# Patient Record
Sex: Male | Born: 1950
Health system: Southern US, Community
[De-identification: ages and names within clinical notes are randomized; demographics above are authoritative.]

## PROBLEM LIST (undated history)

## (undated) DIAGNOSIS — H409 Unspecified glaucoma: Secondary | ICD-10-CM

## (undated) DIAGNOSIS — E119 Type 2 diabetes mellitus without complications: Secondary | ICD-10-CM

## (undated) DIAGNOSIS — B1920 Unspecified viral hepatitis C without hepatic coma: Secondary | ICD-10-CM

## (undated) DIAGNOSIS — I10 Essential (primary) hypertension: Secondary | ICD-10-CM

## (undated) DIAGNOSIS — C801 Malignant (primary) neoplasm, unspecified: Secondary | ICD-10-CM

## (undated) DIAGNOSIS — Z972 Presence of dental prosthetic device (complete) (partial): Secondary | ICD-10-CM

## (undated) DIAGNOSIS — R350 Frequency of micturition: Secondary | ICD-10-CM

## (undated) DIAGNOSIS — Z923 Personal history of irradiation: Secondary | ICD-10-CM

## (undated) DIAGNOSIS — F1010 Alcohol abuse, uncomplicated: Secondary | ICD-10-CM

## (undated) HISTORY — DX: Frequency of micturition: R35.0

## (undated) HISTORY — PX: KNEE SURGERY: SHX244

## (undated) HISTORY — DX: Unspecified glaucoma: H40.9

## (undated) HISTORY — DX: Presence of dental prosthetic device (complete) (partial): Z97.2

## (undated) HISTORY — DX: Unspecified viral hepatitis C without hepatic coma: B19.20

## (undated) HISTORY — PX: EYE SURGERY: SHX253

---

## 1999-07-25 ENCOUNTER — Emergency Department (HOSPITAL_COMMUNITY): Admission: EM | Admit: 1999-07-25 | Discharge: 1999-07-25 | Payer: Self-pay | Admitting: Emergency Medicine

## 1999-07-25 ENCOUNTER — Encounter: Payer: Self-pay | Admitting: Emergency Medicine

## 2004-05-27 ENCOUNTER — Emergency Department (HOSPITAL_COMMUNITY): Admission: EM | Admit: 2004-05-27 | Discharge: 2004-05-27 | Payer: Self-pay | Admitting: Emergency Medicine

## 2005-02-07 ENCOUNTER — Inpatient Hospital Stay (HOSPITAL_COMMUNITY): Admission: EM | Admit: 2005-02-07 | Discharge: 2005-02-09 | Payer: Self-pay | Admitting: *Deleted

## 2007-10-12 ENCOUNTER — Inpatient Hospital Stay (HOSPITAL_COMMUNITY): Admission: EM | Admit: 2007-10-12 | Discharge: 2007-10-21 | Payer: Self-pay | Admitting: Family Medicine

## 2009-09-15 HISTORY — PX: ANKLE SURGERY: SHX546

## 2010-10-12 ENCOUNTER — Emergency Department (HOSPITAL_COMMUNITY)
Admission: EM | Admit: 2010-10-12 | Discharge: 2010-10-13 | Payer: Self-pay | Source: Home / Self Care | Admitting: Emergency Medicine

## 2011-01-13 ENCOUNTER — Ambulatory Visit
Admission: RE | Admit: 2011-01-13 | Discharge: 2011-01-13 | Disposition: A | Discharge status: Home / Self Care | Payer: Medicaid Other | Source: Ambulatory Visit | Attending: Family Medicine | Admitting: Family Medicine

## 2011-01-13 ENCOUNTER — Other Ambulatory Visit: Payer: Self-pay | Admitting: Family Medicine

## 2011-01-13 DIAGNOSIS — Z0000 Encounter for general adult medical examination without abnormal findings: Secondary | ICD-10-CM
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
## 2011-06-05 LAB — URINE CULTURE: Colony Count: 10000

## 2011-06-05 LAB — CBC
HCT: 40.5
Hemoglobin: 13.7
MCHC: 34.5
MCV: 96.6
MCV: 97
Platelets: 119 — ABNORMAL LOW
Platelets: 121 — ABNORMAL LOW
Platelets: 133 — ABNORMAL LOW
RBC: 3.87 — ABNORMAL LOW
RBC: 3.96 — ABNORMAL LOW
WBC: 5.1
WBC: 6.6

## 2011-06-05 LAB — COMPREHENSIVE METABOLIC PANEL
ALT: 51
AST: 42 — ABNORMAL HIGH
AST: 62 — ABNORMAL HIGH
Albumin: 3.4 — ABNORMAL LOW
Albumin: 3.4 — ABNORMAL LOW
Alkaline Phosphatase: 37 — ABNORMAL LOW
CO2: 26
CO2: 30
Calcium: 9.3
Chloride: 105
Creatinine, Ser: 0.62
Creatinine, Ser: 0.69
GFR calc Af Amer: >60
GFR calc non Af Amer: >60
GFR calc non Af Amer: >60
Potassium: 4.4
Sodium: 134 — ABNORMAL LOW
Total Bilirubin: 0.6
Total Protein: 6.9

## 2011-06-05 LAB — DIFFERENTIAL
Basophils Absolute: 0
Basophils Relative: 0
Eosinophils Absolute: 0
Eosinophils Relative: 0
Monocytes Absolute: 0.5
Monocytes Relative: 8
Neutro Abs: 5.2

## 2011-06-05 LAB — URINALYSIS, ROUTINE W REFLEX MICROSCOPIC
Bilirubin Urine: NEGATIVE
Glucose, UA: 100 — AB
Hgb urine dipstick: NEGATIVE
Ketones, ur: NEGATIVE
Ketones, ur: NEGATIVE
Nitrite: NEGATIVE
Protein, ur: NEGATIVE
Specific Gravity, Urine: 1.02
Urobilinogen, UA: 0.2
pH: 6

## 2011-06-05 LAB — RAPID URINE DRUG SCREEN, HOSP PERFORMED
Benzodiazepines: NOT DETECTED
Cocaine: NOT DETECTED
Opiates: NOT DETECTED
Tetrahydrocannabinol: NOT DETECTED

## 2011-06-05 LAB — HEMOGLOBINOPATHY EVALUATION
Hgb A: 97.4 %
Hgb F Quant: 0 (ref 0.0–2.0)
Hgb S Quant: 0 % (ref 0.0–0.0)

## 2011-06-05 LAB — URINE MICROSCOPIC-ADD ON

## 2011-06-06 LAB — CBC
HCT: 41.4
HCT: 42
Hemoglobin: 14.5
MCV: 96.5
MCV: 97.3
Platelets: 229
Platelets: 239
RBC: 4.26
RDW: 12.6
WBC: 4.3
WBC: 4.3

## 2011-06-06 LAB — PROTIME-INR
INR: 1
INR: 1.3
INR: 1.6 — ABNORMAL HIGH
Prothrombin Time: 13.3
Prothrombin Time: 19.4 — ABNORMAL HIGH

## 2011-06-28 ENCOUNTER — Emergency Department (HOSPITAL_COMMUNITY)
Admission: EM | Admit: 2011-06-28 | Discharge: 2011-06-28 | Disposition: A | Discharge status: Home / Self Care | Payer: Medicaid Other | Attending: Emergency Medicine | Admitting: Emergency Medicine

## 2011-06-28 ENCOUNTER — Inpatient Hospital Stay (INDEPENDENT_AMBULATORY_CARE_PROVIDER_SITE_OTHER)
Admission: RE | Admit: 2011-06-28 | Discharge: 2011-06-28 | Disposition: A | Discharge status: Home / Self Care | Payer: Medicaid Other | Source: Ambulatory Visit | Attending: Family Medicine | Admitting: Family Medicine

## 2011-06-28 ENCOUNTER — Emergency Department (HOSPITAL_COMMUNITY): Payer: Medicaid Other

## 2011-06-28 DIAGNOSIS — M79609 Pain in unspecified limb: Secondary | ICD-10-CM

## 2011-06-28 DIAGNOSIS — M7989 Other specified soft tissue disorders: Secondary | ICD-10-CM

## 2011-06-28 DIAGNOSIS — M25469 Effusion, unspecified knee: Secondary | ICD-10-CM | POA: Insufficient documentation

## 2011-06-28 DIAGNOSIS — X500XXA Overexertion from strenuous movement or load, initial encounter: Secondary | ICD-10-CM | POA: Insufficient documentation

## 2011-06-28 DIAGNOSIS — R609 Edema, unspecified: Secondary | ICD-10-CM | POA: Insufficient documentation

## 2011-06-28 DIAGNOSIS — L02419 Cutaneous abscess of limb, unspecified: Secondary | ICD-10-CM | POA: Insufficient documentation

## 2011-06-28 DIAGNOSIS — M25569 Pain in unspecified knee: Secondary | ICD-10-CM | POA: Insufficient documentation

## 2011-11-15 ENCOUNTER — Emergency Department (INDEPENDENT_AMBULATORY_CARE_PROVIDER_SITE_OTHER)
Admission: EM | Admit: 2011-11-15 | Discharge: 2011-11-15 | Disposition: A | Discharge status: Nursing Home (Includes ICF) | Payer: Medicaid Other | Source: Home / Self Care | Attending: Family Medicine | Admitting: Family Medicine

## 2011-11-15 ENCOUNTER — Emergency Department (HOSPITAL_COMMUNITY): Payer: Medicaid Other

## 2011-11-15 ENCOUNTER — Emergency Department (HOSPITAL_COMMUNITY)
Admission: EM | Admit: 2011-11-15 | Discharge: 2011-11-15 | Disposition: A | Discharge status: Home / Self Care | Payer: Medicaid Other | Attending: Emergency Medicine | Admitting: Emergency Medicine

## 2011-11-15 ENCOUNTER — Encounter (HOSPITAL_COMMUNITY): Payer: Self-pay | Admitting: *Deleted

## 2011-11-15 ENCOUNTER — Encounter (HOSPITAL_COMMUNITY): Payer: Self-pay | Admitting: Cardiology

## 2011-11-15 DIAGNOSIS — R6884 Jaw pain: Secondary | ICD-10-CM | POA: Insufficient documentation

## 2011-11-15 DIAGNOSIS — S02609A Fracture of mandible, unspecified, initial encounter for closed fracture: Secondary | ICD-10-CM | POA: Insufficient documentation

## 2011-11-15 DIAGNOSIS — S0993XA Unspecified injury of face, initial encounter: Secondary | ICD-10-CM | POA: Insufficient documentation

## 2011-11-15 DIAGNOSIS — S199XXA Unspecified injury of neck, initial encounter: Secondary | ICD-10-CM | POA: Insufficient documentation

## 2011-11-15 DIAGNOSIS — S0003XA Contusion of scalp, initial encounter: Secondary | ICD-10-CM

## 2011-11-15 DIAGNOSIS — S0083XA Contusion of other part of head, initial encounter: Secondary | ICD-10-CM

## 2011-11-15 MED ORDER — OXYCODONE-ACETAMINOPHEN 5-325 MG PO TABS
1.0000 | ORAL_TABLET | Freq: Once | ORAL | Status: AC
  Administered 2011-11-15: 1 via ORAL
  Filled 2011-11-15: qty 1

## 2011-11-15 MED ORDER — CLINDAMYCIN HCL 300 MG PO CAPS: 300.0000 mg | ORAL_CAPSULE | Freq: Three times a day (TID) | ORAL | Status: AC

## 2011-11-15 MED ORDER — CLINDAMYCIN HCL 300 MG PO CAPS: 300.0000 mg | ORAL_CAPSULE | Freq: Three times a day (TID) | ORAL | Status: DC

## 2011-11-15 NOTE — ED Provider Notes (Signed)
History     CSN: [id number]  Arrival date & time 11/15/11  1119   First MD Initiated Contact with Patient 11/15/11 1147      Chief Complaint  Patient presents with  . Jaw Pain    (Consider location/radiation/quality/duration/timing/severity/associated sxs/prior treatment) HPI  History reviewed. No pertinent past medical history.  Past Surgical History  Procedure Date  . Ankle surgery 2011    right ankle    No family history on file.  History  Substance Use Topics  . Smoking status: Current Everyday Smoker -- 0.5 packs/day    Types: Cigarettes  . Smokeless tobacco: Not on file  . Alcohol Use: Yes     daily      Review of Systems  Allergies  Review of patient's allergies indicates no known allergies.  Home Medications  No current outpatient prescriptions on file.  BP 139/96  Pulse 86  Temp(Src) 97.9 F (36.6 C) (Oral)  Resp 14  SpO2 95%  Physical Exam  Constitutional: He is oriented to person, place, and time. He appears well-developed and well-nourished.  HENT:  Head: Head is with contusion.    Right Ear: Hearing, tympanic membrane, external ear and ear canal normal.  Left Ear: Hearing, tympanic membrane, external ear and ear canal normal.  Nose: Right sinus exhibits maxillary sinus tenderness. Right sinus exhibits no frontal sinus tenderness. Left sinus exhibits no maxillary sinus tenderness and no frontal sinus tenderness.  Mouth/Throat: Oropharynx is clear and moist. He does not have dentures.       dentulous Tenderness over the L mandible and L TMJ  Area w/swelling as well  Neck: Normal range of motion. Neck supple.  Neurological: He is alert and oriented to person, place, and time.  Skin: Skin is warm.  Psychiatric: He has a normal mood and affect. His behavior is normal.    ED Course  Procedures (including critical care time)   1. Contusion of jaw    Transfer to [facility] for futher evaluation and possible CT of the facial bones   MDM           Hassan Rowan, MD 11/15/11 1316
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
## 2011-11-15 NOTE — Discharge Instructions (Signed)
Soft and liquid diet, absolutely no chewing for 6 weeks.  Try to quit smoking and drinking.

## 2013-05-29 ENCOUNTER — Emergency Department (INDEPENDENT_AMBULATORY_CARE_PROVIDER_SITE_OTHER): Payer: Medicaid Other

## 2013-05-29 ENCOUNTER — Emergency Department (HOSPITAL_COMMUNITY)
Admission: EM | Admit: 2013-05-29 | Discharge: 2013-05-29 | Disposition: A | Discharge status: Home / Self Care | Payer: Medicaid Other | Source: Home / Self Care | Attending: Family Medicine | Admitting: Family Medicine

## 2013-05-29 ENCOUNTER — Encounter (HOSPITAL_COMMUNITY): Payer: Self-pay | Admitting: Emergency Medicine

## 2013-05-29 DIAGNOSIS — S2249XA Multiple fractures of ribs, unspecified side, initial encounter for closed fracture: Secondary | ICD-10-CM

## 2013-05-29 DIAGNOSIS — S20219A Contusion of unspecified front wall of thorax, initial encounter: Secondary | ICD-10-CM

## 2013-05-29 DIAGNOSIS — S20212A Contusion of left front wall of thorax, initial encounter: Secondary | ICD-10-CM

## 2013-05-29 DIAGNOSIS — W1809XA Striking against other object with subsequent fall, initial encounter: Secondary | ICD-10-CM

## 2013-05-29 DIAGNOSIS — S2241XA Multiple fractures of ribs, right side, initial encounter for closed fracture: Secondary | ICD-10-CM

## 2013-05-29 MED ORDER — HYDROCODONE-ACETAMINOPHEN 5-325 MG PO TABS: 1.0000 | ORAL_TABLET | ORAL | Status: DC | PRN
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
## 2013-05-29 NOTE — ED Notes (Addendum)
C/o right side collar bone down to ribs hurt due to patient falling down stairs on Friday.  Asa taken for pain but no relief.

## 2013-05-30 NOTE — ED Provider Notes (Signed)
Medical screening examination/treatment/procedure(s) were performed by a resident physician or non-physician practitioner and as the supervising physician I was immediately available for consultation/collaboration.  Zori Benbrook, MD    Travaughn Vue S Ambriana Selway, MD 05/30/13 0757 

## 2013-06-16 ENCOUNTER — Encounter (HOSPITAL_COMMUNITY): Payer: Self-pay | Admitting: *Deleted

## 2013-06-16 ENCOUNTER — Emergency Department (HOSPITAL_COMMUNITY)
Admission: EM | Admit: 2013-06-16 | Discharge: 2013-06-16 | Disposition: A | Discharge status: Home / Self Care | Payer: Medicaid Other | Attending: Emergency Medicine | Admitting: Emergency Medicine

## 2013-06-16 ENCOUNTER — Emergency Department (HOSPITAL_COMMUNITY): Payer: Medicaid Other

## 2013-06-16 DIAGNOSIS — R0602 Shortness of breath: Secondary | ICD-10-CM | POA: Insufficient documentation

## 2013-06-16 DIAGNOSIS — R040 Epistaxis: Secondary | ICD-10-CM | POA: Insufficient documentation

## 2013-06-16 DIAGNOSIS — G8911 Acute pain due to trauma: Secondary | ICD-10-CM | POA: Insufficient documentation

## 2013-06-16 DIAGNOSIS — R059 Cough, unspecified: Secondary | ICD-10-CM | POA: Insufficient documentation

## 2013-06-16 DIAGNOSIS — S2232XS Fracture of one rib, left side, sequela: Secondary | ICD-10-CM

## 2013-06-16 DIAGNOSIS — R0789 Other chest pain: Secondary | ICD-10-CM

## 2013-06-16 DIAGNOSIS — R05 Cough: Secondary | ICD-10-CM | POA: Insufficient documentation

## 2013-06-16 DIAGNOSIS — Z8781 Personal history of (healed) traumatic fracture: Secondary | ICD-10-CM | POA: Insufficient documentation

## 2013-06-16 DIAGNOSIS — R079 Chest pain, unspecified: Secondary | ICD-10-CM

## 2013-06-16 DIAGNOSIS — R071 Chest pain on breathing: Secondary | ICD-10-CM | POA: Insufficient documentation

## 2013-06-16 DIAGNOSIS — R319 Hematuria, unspecified: Secondary | ICD-10-CM | POA: Insufficient documentation

## 2013-06-16 DIAGNOSIS — F17200 Nicotine dependence, unspecified, uncomplicated: Secondary | ICD-10-CM | POA: Insufficient documentation

## 2013-06-16 LAB — COMPREHENSIVE METABOLIC PANEL
ALT: 63 U/L — ABNORMAL HIGH (ref 0–53)
AST: 102 U/L — ABNORMAL HIGH (ref 0–37)
Albumin: 2.7 g/dL — ABNORMAL LOW (ref 3.5–5.2)
Alkaline Phosphatase: 108 U/L (ref 39–117)
BUN: 7 mg/dL (ref 6–23)
Chloride: 103 mEq/L (ref 96–112)
Potassium: 4.1 mEq/L (ref 3.5–5.1)
Sodium: 136 mEq/L (ref 135–145)
Total Bilirubin: 5 mg/dL — ABNORMAL HIGH (ref 0.3–1.2)
Total Protein: 7.1 g/dL (ref 6.0–8.3)

## 2013-06-16 LAB — CBC WITH DIFFERENTIAL/PLATELET
Basophils Absolute: 0 10*3/uL (ref 0.0–0.1)
Basophils Relative: 1 % (ref 0–1)
Eosinophils Absolute: 0.1 10*3/uL (ref 0.0–0.7)
MCH: 34 pg (ref 26.0–34.0)
MCHC: 35.8 g/dL (ref 30.0–36.0)
Neutro Abs: 1.9 10*3/uL (ref 1.7–7.7)
Neutrophils Relative %: 52 % (ref 43–77)
Platelets: 137 10*3/uL — ABNORMAL LOW (ref 150–400)

## 2013-06-16 LAB — PROTIME-INR
INR: 1.19 (ref 0.00–1.49)
Prothrombin Time: 14.8 seconds (ref 11.6–15.2)

## 2013-06-16 LAB — LACTIC ACID, PLASMA: Lactic Acid, Venous: 2.6 mmol/L — ABNORMAL HIGH (ref 0.5–2.2)

## 2013-06-16 MED ORDER — KETOROLAC TROMETHAMINE 15 MG/ML IJ SOLN: 15.0000 mg | Freq: Once | INTRAMUSCULAR | Status: DC

## 2013-06-16 MED ORDER — SODIUM CHLORIDE 0.9 % IV BOLUS (SEPSIS): 1000.0000 mL | Freq: Once | INTRAVENOUS | Status: DC

## 2013-06-16 MED ORDER — KETOROLAC TROMETHAMINE 60 MG/2ML IM SOLN
30.0000 mg | Freq: Once | INTRAMUSCULAR | Status: DC
  Filled 2013-06-16: qty 2

## 2013-06-16 MED ORDER — KETOROLAC TROMETHAMINE 30 MG/ML IJ SOLN
30.0000 mg | Freq: Once | INTRAMUSCULAR | Status: AC
  Administered 2013-06-16: 30 mg via INTRAMUSCULAR

## 2013-06-16 MED ORDER — OXYCODONE-ACETAMINOPHEN 5-325 MG PO TABS
1.0000 | ORAL_TABLET | Freq: Once | ORAL | Status: AC
  Administered 2013-06-16: 1 via ORAL
  Filled 2013-06-16: qty 1

## 2013-06-16 NOTE — ED Notes (Signed)
Received report from off going RN and introduced self to the pt. 

## 2013-06-16 NOTE — ED Notes (Signed)
Pt to xray at this time.

## 2013-06-16 NOTE — ED Notes (Signed)
Able to tolerate oral fluids and finished 24 oz of water prior to departure
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
## 2013-06-16 NOTE — ED Notes (Signed)
Pt st's pain has improved with pain med.
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
## 2013-08-18 ENCOUNTER — Emergency Department (HOSPITAL_COMMUNITY)
Admission: EM | Admit: 2013-08-18 | Discharge: 2013-08-18 | Disposition: A | Discharge status: Home / Self Care | Payer: No Typology Code available for payment source | Attending: Emergency Medicine | Admitting: Emergency Medicine

## 2013-08-18 ENCOUNTER — Encounter (HOSPITAL_COMMUNITY): Payer: Self-pay | Admitting: Emergency Medicine

## 2013-08-18 DIAGNOSIS — Y92410 Unspecified street and highway as the place of occurrence of the external cause: Secondary | ICD-10-CM | POA: Diagnosis not present

## 2013-08-18 DIAGNOSIS — S79919A Unspecified injury of unspecified hip, initial encounter: Secondary | ICD-10-CM | POA: Insufficient documentation

## 2013-08-18 DIAGNOSIS — S46909A Unspecified injury of unspecified muscle, fascia and tendon at shoulder and upper arm level, unspecified arm, initial encounter: Secondary | ICD-10-CM | POA: Insufficient documentation

## 2013-08-18 DIAGNOSIS — S8990XA Unspecified injury of unspecified lower leg, initial encounter: Secondary | ICD-10-CM | POA: Insufficient documentation

## 2013-08-18 DIAGNOSIS — IMO0002 Reserved for concepts with insufficient information to code with codable children: Secondary | ICD-10-CM | POA: Diagnosis not present

## 2013-08-18 DIAGNOSIS — M25511 Pain in right shoulder: Secondary | ICD-10-CM

## 2013-08-18 DIAGNOSIS — S0993XA Unspecified injury of face, initial encounter: Secondary | ICD-10-CM | POA: Diagnosis not present

## 2013-08-18 DIAGNOSIS — F17200 Nicotine dependence, unspecified, uncomplicated: Secondary | ICD-10-CM | POA: Insufficient documentation

## 2013-08-18 DIAGNOSIS — T148XXA Other injury of unspecified body region, initial encounter: Secondary | ICD-10-CM

## 2013-08-18 DIAGNOSIS — Y9389 Activity, other specified: Secondary | ICD-10-CM | POA: Insufficient documentation

## 2013-08-18 DIAGNOSIS — S4980XA Other specified injuries of shoulder and upper arm, unspecified arm, initial encounter: Secondary | ICD-10-CM | POA: Insufficient documentation

## 2013-08-18 NOTE — ED Notes (Signed)
Discharge instructions reviewed. Pt verbalized understanding.  
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
## 2013-08-18 NOTE — ED Provider Notes (Signed)
Medical screening examination/treatment/procedure(s) were performed by non-physician practitioner and as supervising physician I was immediately available for consultation/collaboration.  Adelyn Roscher L Jniya Madara, MD 08/18/13 1657 
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
## 2013-09-01 ENCOUNTER — Ambulatory Visit
Admission: RE | Admit: 2013-09-01 | Discharge: 2013-09-01 | Disposition: A | Discharge status: Home / Self Care | Payer: Medicaid Other | Source: Ambulatory Visit | Attending: Family Medicine | Admitting: Family Medicine

## 2013-09-01 ENCOUNTER — Other Ambulatory Visit: Payer: Self-pay | Admitting: Family Medicine

## 2013-09-01 DIAGNOSIS — M25551 Pain in right hip: Secondary | ICD-10-CM

## 2013-09-01 DIAGNOSIS — M25511 Pain in right shoulder: Secondary | ICD-10-CM

## 2013-09-01 DIAGNOSIS — M25561 Pain in right knee: Secondary | ICD-10-CM

## 2013-09-01 DIAGNOSIS — M542 Cervicalgia: Secondary | ICD-10-CM

## 2013-09-05 ENCOUNTER — Ambulatory Visit: Payer: No Typology Code available for payment source | Attending: Family Medicine | Admitting: Physical Therapy

## 2013-09-19 ENCOUNTER — Ambulatory Visit: Payer: Medicaid Other | Admitting: Physical Therapy

## 2013-09-20 ENCOUNTER — Ambulatory Visit: Payer: No Typology Code available for payment source | Attending: Family Medicine | Admitting: Physical Therapy

## 2013-10-19 ENCOUNTER — Ambulatory Visit: Payer: No Typology Code available for payment source | Attending: Family Medicine | Admitting: Physical Therapy

## 2013-10-19 DIAGNOSIS — M542 Cervicalgia: Secondary | ICD-10-CM | POA: Insufficient documentation

## 2013-10-19 DIAGNOSIS — M25519 Pain in unspecified shoulder: Secondary | ICD-10-CM | POA: Diagnosis not present

## 2013-10-19 DIAGNOSIS — M25569 Pain in unspecified knee: Secondary | ICD-10-CM | POA: Insufficient documentation

## 2013-10-19 DIAGNOSIS — M545 Low back pain, unspecified: Secondary | ICD-10-CM | POA: Diagnosis not present

## 2013-10-19 DIAGNOSIS — IMO0001 Reserved for inherently not codable concepts without codable children: Secondary | ICD-10-CM | POA: Diagnosis present

## 2013-10-21 ENCOUNTER — Ambulatory Visit: Payer: No Typology Code available for payment source | Admitting: Physical Therapy

## 2013-10-21 DIAGNOSIS — IMO0001 Reserved for inherently not codable concepts without codable children: Secondary | ICD-10-CM | POA: Diagnosis not present

## 2013-10-24 ENCOUNTER — Ambulatory Visit: Payer: No Typology Code available for payment source | Admitting: Physical Therapy

## 2013-10-24 DIAGNOSIS — IMO0001 Reserved for inherently not codable concepts without codable children: Secondary | ICD-10-CM | POA: Diagnosis not present

## 2013-10-27 ENCOUNTER — Ambulatory Visit: Payer: No Typology Code available for payment source | Admitting: Physical Therapy

## 2013-10-27 DIAGNOSIS — IMO0001 Reserved for inherently not codable concepts without codable children: Secondary | ICD-10-CM | POA: Diagnosis not present

## 2013-10-31 ENCOUNTER — Ambulatory Visit: Payer: No Typology Code available for payment source | Admitting: Physical Therapy

## 2013-10-31 DIAGNOSIS — IMO0001 Reserved for inherently not codable concepts without codable children: Secondary | ICD-10-CM | POA: Diagnosis not present

## 2013-11-04 ENCOUNTER — Ambulatory Visit: Payer: No Typology Code available for payment source | Admitting: Physical Therapy

## 2013-11-18 ENCOUNTER — Encounter (HOSPITAL_COMMUNITY): Payer: Self-pay | Admitting: Emergency Medicine

## 2013-11-18 ENCOUNTER — Emergency Department (HOSPITAL_COMMUNITY)
Admission: EM | Admit: 2013-11-18 | Discharge: 2013-11-19 | Disposition: A | Discharge status: Home / Self Care | Payer: Medicaid Other | Attending: Emergency Medicine | Admitting: Emergency Medicine

## 2013-11-18 DIAGNOSIS — F1010 Alcohol abuse, uncomplicated: Secondary | ICD-10-CM | POA: Insufficient documentation

## 2013-11-18 DIAGNOSIS — F17200 Nicotine dependence, unspecified, uncomplicated: Secondary | ICD-10-CM | POA: Insufficient documentation

## 2013-11-18 DIAGNOSIS — F10929 Alcohol use, unspecified with intoxication, unspecified: Secondary | ICD-10-CM

## 2013-11-18 HISTORY — DX: Alcohol abuse, uncomplicated: F10.10

## 2013-11-18 LAB — I-STAT CHEM 8, ED
BUN: 3 mg/dL — AB (ref 6–23)
CALCIUM ION: 1.13 mmol/L (ref 1.13–1.30)
CHLORIDE: 106 meq/L (ref 96–112)
Creatinine, Ser: 1.2 mg/dL (ref 0.50–1.35)
GLUCOSE: 118 mg/dL — AB (ref 70–99)
HCT: 48 % (ref 39.0–52.0)
Hemoglobin: 16.3 g/dL (ref 13.0–17.0)
Potassium: 4.3 mEq/L (ref 3.7–5.3)
Sodium: 149 mEq/L — ABNORMAL HIGH (ref 137–147)
TCO2: 31 mmol/L (ref 0–100)

## 2013-11-18 LAB — COMPREHENSIVE METABOLIC PANEL
ALT: 112 U/L — ABNORMAL HIGH (ref 0–53)
AST: 111 U/L — AB (ref 0–37)
Albumin: 3.8 g/dL (ref 3.5–5.2)
Alkaline Phosphatase: 56 U/L (ref 39–117)
BUN: 5 mg/dL — AB (ref 6–23)
CALCIUM: 9.1 mg/dL (ref 8.4–10.5)
CO2: 26 meq/L (ref 19–32)
Chloride: 107 mEq/L (ref 96–112)
Creatinine, Ser: 0.69 mg/dL (ref 0.50–1.35)
Glucose, Bld: 119 mg/dL — ABNORMAL HIGH (ref 70–99)
Potassium: 3.9 mEq/L (ref 3.7–5.3)
Sodium: 147 mEq/L (ref 137–147)
Total Bilirubin: 0.3 mg/dL (ref 0.3–1.2)
Total Protein: 7.9 g/dL (ref 6.0–8.3)

## 2013-11-18 LAB — CBC WITH DIFFERENTIAL/PLATELET
BASOS ABS: 0 10*3/uL (ref 0.0–0.1)
Basophils Relative: 1 % (ref 0–1)
EOS PCT: 1 % (ref 0–5)
Eosinophils Absolute: 0.1 10*3/uL (ref 0.0–0.7)
HEMATOCRIT: 44.3 % (ref 39.0–52.0)
Hemoglobin: 15.3 g/dL (ref 13.0–17.0)
LYMPHS PCT: 59 % — AB (ref 12–46)
Lymphs Abs: 3.3 10*3/uL (ref 0.7–4.0)
MCH: 33.3 pg (ref 26.0–34.0)
MCHC: 34.5 g/dL (ref 30.0–36.0)
MCV: 96.3 fL (ref 78.0–100.0)
MONO ABS: 0.3 10*3/uL (ref 0.1–1.0)
Monocytes Relative: 6 % (ref 3–12)
Neutro Abs: 1.9 10*3/uL (ref 1.7–7.7)
Neutrophils Relative %: 33 % — ABNORMAL LOW (ref 43–77)
Platelets: 185 10*3/uL (ref 150–400)
RBC: 4.6 MIL/uL (ref 4.22–5.81)
RDW: 13.4 % (ref 11.5–15.5)
WBC: 5.6 10*3/uL (ref 4.0–10.5)

## 2013-11-18 LAB — ETHANOL: Alcohol, Ethyl (B): 414 mg/dL (ref 0–11)

## 2013-11-18 LAB — CBG MONITORING, ED: Glucose-Capillary: 107 mg/dL — ABNORMAL HIGH (ref 70–99)

## 2013-11-18 MED ORDER — SODIUM CHLORIDE 0.9 % IV SOLN: INTRAVENOUS | Status: DC

## 2013-11-18 MED ORDER — SODIUM CHLORIDE 0.9 % IV BOLUS (SEPSIS)
1000.0000 mL | Freq: Once | INTRAVENOUS | Status: AC
  Administered 2013-11-18: 1000 mL via INTRAVENOUS
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
## 2013-11-18 NOTE — ED Provider Notes (Signed)
CSN: [id number]     Arrival date & time 11/18/13  1755 History   First MD Initiated Contact with Patient 11/18/13 [place]     Chief Complaint  Patient presents with  . Alcohol Intoxication     (Consider location/radiation/quality/duration/timing/severity/associated sxs/prior Treatment) Patient is a 63 y.o. male presenting with intoxication. The history is provided by the patient. The history is limited by the condition of the patient.  Alcohol Intoxication   patient here after being found by neighbor unresponsive and is regular. It is noted to have heavy alcohol on board. Patient here admits to drinking copious of alcohol today. Denies any somatic complaints at this time is not cooperative with the history.  Past Medical History  Diagnosis Date  . ETOH abuse    Past Surgical History  Procedure Laterality Date  . Ankle surgery  2011    right ankle   History reviewed. No pertinent family history. History  Substance Use Topics  . Smoking status: Current Every Day Smoker -- 0.50 packs/day    Types: Cigarettes  . Smokeless tobacco: Not on file  . Alcohol Use: Yes     Comment: daily    Review of Systems  Unable to perform ROS     Allergies  Review of patient's allergies indicates no known allergies.  Home Medications   Current Outpatient Rx  Name  Route  Sig  Dispense  Refill  . ibuprofen (ADVIL,MOTRIN) 200 MG tablet   Oral   Take 400 mg by mouth every 6 (six) hours as needed. For arthritis         . Multiple Vitamins-Minerals (MULTIVITAMIN PO)   Oral   Take 1 tablet by mouth daily.          BP 120/82  Pulse 98  Temp(Src) 97.6 F (36.4 C) (Oral)  Resp 16  SpO2 93% Physical Exam  Nursing note and vitals reviewed. Constitutional: He appears lethargic. He appears cachectic. He is easily aroused.  Non-toxic appearance. He does not have a sickly appearance. He does not appear ill. No distress.  HENT:  Head: Normocephalic and atraumatic.  Eyes: Conjunctivae, EOM  and lids are normal. Pupils are equal, round, and reactive to light.  Neck: Normal range of motion. Neck supple. No tracheal deviation present. No mass present.  Cardiovascular: Normal rate, regular rhythm and normal heart sounds.  Exam reveals no gallop.   No murmur heard. Pulmonary/Chest: Effort normal and breath sounds normal. No stridor. No respiratory distress. He has no decreased breath sounds. He has no wheezes. He has no rhonchi. He has no rales.  Abdominal: Soft. Normal appearance and bowel sounds are normal. He exhibits no distension. There is no tenderness. There is no rebound and no CVA tenderness.  Musculoskeletal: Normal range of motion. He exhibits no edema and no tenderness.  Neurological: He is easily aroused. He appears lethargic. GCS eye subscore is 4. GCS verbal subscore is 4. GCS motor subscore is 6.  Skin: Skin is warm and dry. No abrasion and no rash noted.  Psychiatric: His affect is blunt. His speech is delayed. He is slowed.    ED Course  Procedures (including critical care time) Labs Review Labs Reviewed - No data to display Imaging Review No results found.   EKG Interpretation None      MDM   Final diagnoses:  None    Patient rechecked and is more sober at this time. Patient had mild hyponatremia which was treated with IV fluids and repeat electrolytes showed improvement.  Will continue to monitor he is clinically sober. Signed out to oncoming physician.    Leota Jacobsen, MD 11/20/13 [phone]

## 2013-11-19 NOTE — ED Provider Notes (Signed)
Patient signed out to me at change of shift (2359h) by Dr. Zenia Resides. Patient presented with AMS secondary to alcohol intoxication - BAL of > 400 mg/dL.  Patient is now alert and ambulatory without assistance. He is stable for discharge.   Elyn Peers, MD 11/19/13 [phone]

## 2013-11-19 NOTE — Discharge Instructions (Signed)
Alcohol Intoxication Alcohol intoxication occurs when the amount of alcohol that a person has consumed impairs his or her ability to mentally and physically function. Alcohol directly impairs the normal chemical activity of the brain. Drinking large amounts of alcohol can lead to changes in mental function and behavior, and it can cause many physical effects that can be harmful.  Alcohol intoxication can range in severity from mild to very severe. Various factors can affect the level of intoxication that occurs, such as the person's age, gender, weight, frequency of alcohol consumption, and the presence of other medical conditions (such as diabetes, seizures, or heart conditions). Dangerous levels of alcohol intoxication may occur when people drink large amounts of alcohol in a short period (binge drinking). Alcohol can also be especially dangerous when combined with certain prescription medicines or "recreational" drugs. SIGNS AND SYMPTOMS Some common signs and symptoms of mild alcohol intoxication include:  Loss of coordination.  Changes in mood and behavior.  Impaired judgment.  Slurred speech. As alcohol intoxication progresses to more severe levels, other signs and symptoms will appear. These may include:  Vomiting.  Confusion and impaired memory.  Slowed breathing.  Seizures.  Loss of consciousness. DIAGNOSIS  Your health care provider will take a medical history and perform a physical exam. You will be asked about the amount and type of alcohol you have consumed. Blood tests will be done to measure the concentration of alcohol in your blood. In many places, your blood alcohol level must be lower than 80 mg/dL (0.08%) to legally drive. However, many dangerous effects of alcohol can occur at much lower levels.  TREATMENT  People with alcohol intoxication often do not require treatment. Most of the effects of alcohol intoxication are temporary, and they go away as the alcohol naturally  leaves the body. Your health care provider will monitor your condition until you are stable enough to go home. Fluids are sometimes given through an IV access tube to help prevent dehydration.  HOME CARE INSTRUCTIONS  Do not drive after drinking alcohol.  Stay hydrated. Drink enough water and fluids to keep your urine clear or pale yellow. Avoid caffeine.   Only take over-the-counter or prescription medicines as directed by your health care provider.  SEEK MEDICAL CARE IF:   You have persistent vomiting.   You do not feel better after a few days.  You have frequent alcohol intoxication. Your health care provider can help determine if you should see a substance use treatment counselor. SEEK IMMEDIATE MEDICAL CARE IF:   You become shaky or tremble when you try to stop drinking.   You shake uncontrollably (seizure).   You throw up (vomit) blood. This may be bright red or may look like black coffee grounds.   You have blood in your stool. This may be bright red or may appear as a black, tarry, bad smelling stool.   You become lightheaded or faint.  MAKE SURE YOU:   Understand these instructions.  Will watch your condition.  Will get help right away if you are not doing well or get worse. Document Released: 06/11/2005 Document Revised: 05/04/2013 Document Reviewed: 02/04/2013 [facility] Patient Information 2014 [place].  Finding Treatment for Alcohol and Drug Addiction It can be hard to find the right place to get professional treatment. Here are some important things to consider:  There are different types of treatment to choose from.  Some programs are live-in (residential) while others are not (outpatient). Sometimes a combination is offered.  No  single type of program is right for everyone.  Most treatment programs involve a combination of education, counseling, and a 12-step, spiritually-based approach.  There are non-spiritually based programs (not  12-step).  Some treatment programs are government sponsored. They are geared for patients without private insurance.  Treatment programs can vary in many respects such as:  Cost and types of insurance accepted.  Types of on-site medical services offered.  Length of stay, setting, and size.  Overall philosophy of treatment. A person may need specialized treatment or have needs not addressed by all programs. For example, adolescents need treatment appropriate for their age. Other people have secondary disorders that must be managed as well. Secondary conditions can include mental illness, such as depression or diabetes. Often, a period of detoxification from alcohol or drugs is needed. This requires medical supervision and not all programs offer this. THINGS TO CONSIDER WHEN SELECTING A TREATMENT PROGRAM   Is the program certified by the appropriate government agency? Even private programs must be certified and employ certified professionals.  Does the program accept your insurance? If not, can a payment plan be set up?  Is the facility clean, organized, and well run? Do they allow you to speak with graduates who can share their treatment experience with you? Can you tour the facility? Can you meet with staff?  Does the program meet the full range of individual needs?  Does the treatment program address sexual orientation and physical disabilities? Do they provide age, gender, and culturally appropriate treatment services?  Is treatment available in languages other than English?  Is long-term aftercare support or guidance encouraged and provided?  Is assessment of an individual's treatment plan ongoing to ensure it meets changing needs?  Does the program use strategies to encourage reluctant patients to remain in treatment long enough to increase the likelihood of success?  Does the program offer counseling (individual or group) and other behavioral therapies?  Does the program  offer medicine as part of the treatment regimen, if needed?  Is there ongoing monitoring of possible relapse? Is there a defined relapse prevention program? Are services or referrals offered to family members to ensure they understand addiction and the recovery process? This would help them support the recovering individual.  Are 12-step meetings held at the center or is transport available for patients to attend outside meetings? In countries outside of the [place] and [place], [employer] for contact information for services in your area. Document Released: 07/31/2005 Document Revised: 11/24/2011 Document Reviewed: 02/10/2008 [facility] Patient Information 2014 [place].

## 2014-06-26 ENCOUNTER — Emergency Department (HOSPITAL_COMMUNITY)
Admission: EM | Admit: 2014-06-26 | Discharge: 2014-06-26 | Disposition: A | Discharge status: Home / Self Care | Payer: Medicaid Other | Attending: Emergency Medicine | Admitting: Emergency Medicine

## 2014-06-26 ENCOUNTER — Encounter (HOSPITAL_COMMUNITY): Payer: Self-pay | Admitting: Emergency Medicine

## 2014-06-26 ENCOUNTER — Emergency Department (HOSPITAL_COMMUNITY): Payer: Medicaid Other

## 2014-06-26 DIAGNOSIS — Y9389 Activity, other specified: Secondary | ICD-10-CM | POA: Insufficient documentation

## 2014-06-26 DIAGNOSIS — W01198A Fall on same level from slipping, tripping and stumbling with subsequent striking against other object, initial encounter: Secondary | ICD-10-CM | POA: Diagnosis not present

## 2014-06-26 DIAGNOSIS — E119 Type 2 diabetes mellitus without complications: Secondary | ICD-10-CM | POA: Diagnosis not present

## 2014-06-26 DIAGNOSIS — Z720 Tobacco use: Secondary | ICD-10-CM | POA: Insufficient documentation

## 2014-06-26 DIAGNOSIS — Z9889 Other specified postprocedural states: Secondary | ICD-10-CM | POA: Diagnosis not present

## 2014-06-26 DIAGNOSIS — Y9289 Other specified places as the place of occurrence of the external cause: Secondary | ICD-10-CM | POA: Diagnosis not present

## 2014-06-26 DIAGNOSIS — S82044A Nondisplaced comminuted fracture of right patella, initial encounter for closed fracture: Secondary | ICD-10-CM | POA: Insufficient documentation

## 2014-06-26 DIAGNOSIS — S8991XA Unspecified injury of right lower leg, initial encounter: Secondary | ICD-10-CM | POA: Diagnosis present

## 2014-06-26 HISTORY — DX: Type 2 diabetes mellitus without complications: E11.9

## 2014-06-26 MED ORDER — IBUPROFEN 200 MG PO TABS
600.0000 mg | ORAL_TABLET | Freq: Once | ORAL | Status: AC
  Administered 2014-06-26: 600 mg via ORAL
  Filled 2014-06-26: qty 3

## 2014-06-26 MED ORDER — HYDROCODONE-ACETAMINOPHEN 5-325 MG PO TABS: 2.0000 | ORAL_TABLET | ORAL | Status: DC | PRN

## 2014-06-26 NOTE — Discharge Instructions (Signed)
Knee Bracing  Knee braces are supports to help stabilize and protect an injured or painful knee. They come in many different styles. They should support and protect the knee without increasing the chance of other injuries to yourself or others. It is important not to have a false sense of security when using a brace. Knee braces that help you to keep using your knee:  · Do not restore normal knee stability under high stress forces.  · May decrease some aspects of athletic performance.  Some of the different types of knee braces are:  · Prophylactic knee braces are designed to prevent or reduce the severity of knee injuries during sports that make injury to the knee more likely.  · Rehabilitative knee braces are designed to allow protected motion of:  ¨ Injured knees.  ¨ Knees that have been treated with or without surgery.  There is no evidence that the use of a supportive knee brace protects the graft following a successful anterior cruciate ligament (ACL) reconstruction. However, braces are sometimes used to:   · Protect injured ligaments.  · Control knee movement during the initial healing period.  They may be used as part of the treatment program for the various injured ligaments or cartilage of the knee including the:  · Anterior cruciate ligament.  · Medial collateral ligament.  · Medial or lateral cartilage (meniscus).  · Posterior cruciate ligament.  · Lateral collateral ligament.  Rehabilitative knee braces are most commonly used:  · During crutch-assisted walking right after injury.  · During crutch-assisted walking right after surgery to repair the cartilage and/or cruciate ligament injury.  · For a short period of time, 2-8 weeks, after the injury or surgery.  The value of a rehabilitative brace as opposed to a cast or splint includes the:  · Ability to adjust the brace for swelling.  · Ability to remove the brace for examinations, icing, or showering.  · Ability to allow for movement in a controlled  range of motion.  Functional knee braces give support to knees that have already been injured. They are designed to provide stability for the injured knee and provide protection after repair. Functional knee braces may not affect performance much. Lower extremity muscle strengthening, flexibility, and improvement in technique are more important than bracing in treating ligamentous knee injuries. Functional braces are not a substitute for rehabilitation or surgical procedures.  Unloader/off-loader braces are designed to provide pain relief in arthritic knees. Patients with wear and tear arthritis from growing old or from an old cartilage injury (osteoarthritis) of the knee, and bowlegged (varus) or knock-knee (valgus) deformities, often develop increased pain in the arthritic side due to increased loading. Unloader/off-loader braces are made to reduce uneven loading in such knees. There is reduction in bowing out movement in bowlegged knees when the correct unloader brace is used. Patients with advanced osteoarthritis or severe varus or valgus alignment problems would not likely benefit from bracing.  Patellofemoral braces help the kneecap to move smoothly and well centered over the end of the femur in the knee.   Most people who wear knee braces feel that they help. However, there is a lack of scientific evidence that knee braces are helpful at the level needed for athletic participation to prevent injury. In spite of this, athletes report an increase in knee stability, pain relief, performance improvement, and confidence during athletics when using a brace.   Different knee problems require different knee braces:  · Your caregiver may suggest one   also need one for pain in the front of your knee that is not getting better with strengthening and  flexibility exercises. Get your caregiver's advice if you want to try a knee brace. The caregiver will advise you on where to get them and provide a prescription when it is needed to fashion and/or fit the brace. Knee braces are the least important part of preventing knee injuries or getting better following injury. Stretching, strengthening and technique improvement are far more important in caring for and preventing knee injuries. When strengthening your knee, increase your activities a little at a time so as not to develop injuries from overuse. Work out an exercise plan with your caregiver and/or physical therapist to get the best program for you. Do not let a knee brace become a crutch. Always remember, there are no braces which support the knee as well as your original ligaments and cartilage you were born with. Conditioning, proper warm-up, and stretching remain the most important parts of keeping your knees healthy. HOW TO USE A KNEE BRACE  During sports, knee braces should be used as directed by your caregiver.  Make sure that the hinges are where the knee bends.  Straps, tapes, or hook-and-loop tapes should be fastened around your leg as instructed.  You should check the placement of the brace during activities to make sure that it has not moved. Poorly positioned braces can hurt rather than help you.  To work well, a knee brace should be worn during all activities that put you at risk of knee injury.  Warm up properly before beginning athletic activities. HOME CARE INSTRUCTIONS  Knee braces often get damaged during normal use. Replace worn-out braces for maximum benefit.  Clean regularly with soap and water.  Inspect your brace often for wear and tear.  Cover exposed metal to protect others from injury.  Durable materials may cost more, but last longer. SEEK IMMEDIATE MEDICAL CARE IF:   Your knee seems to be getting worse rather than better.  You have increasing pain or  swelling in the knee.  You have problems caused by the knee brace.  You have increased swelling or inflammation (redness or soreness) in your knee.  Your knee becomes warm and more painful and you develop an unexplained temperature over 101F (38.3C). MAKE SURE YOU:   Understand these instructions.  Will watch your condition.  Will get help right away if you are not doing well or get worse. See your caregiver, physical therapist, or orthopedic surgeon for additional information. Document Released: 11/22/2003 Document Revised: 01/16/2014 Document Reviewed: 02/28/2009 [facility] Patient Information 2015 [place], [place]. This information is not intended to replace advice given to you by your health care provider. Make sure you discuss any questions you have with your health care provider.  Discussed with Dr. Tamera Punt, who will see, you in the office on Wednesday.  Please call first thing tomorrow morning to set a specific time

## 2014-06-26 NOTE — ED Provider Notes (Signed)
CSN: [id number]     Arrival date & time 06/26/14  1644 History  This chart was scribed for Wendall Papa, PA, working with Babette Relic, MD found by Starleen Arms, ED Scribe. This patient was seen in room WTR6/WTR6 and the patient's care was started at 8:08 PM.   Chief Complaint  Patient presents with  . Knee Pain    right   The history is provided by the patient. No language interpreter was used.    HPI Comments: [name] is a 63 y.o. male who presents to the Emergency Department complaining of right knee pain and swelling secondary to an injury 3 days ago.  Patient reports he tripped over a curb and landed on his knee and has since had constant, improving knee pain   Patient reports he has taken Tylenol without relief. Patient reports he is able to ambulate with pain.  Patient has a history of right knee surgery by Dr. Eulas Post > 25 years ago.   Past Medical History  Diagnosis Date  . ETOH abuse   . Diabetes mellitus without complication    Past Surgical History  Procedure Laterality Date  . Ankle surgery  2011    right ankle  . Knee surgery     History reviewed. No pertinent family history. History  Substance Use Topics  . Smoking status: Current Every Day Smoker -- 0.50 packs/day    Types: Cigarettes  . Smokeless tobacco: Not on file  . Alcohol Use: Yes     Comment: daily    Review of Systems  Constitutional: Negative for fever.  Musculoskeletal: Positive for arthralgias, gait problem and joint swelling.  Skin: Negative for wound.  All other systems reviewed and are negative.    Allergies  Review of patient's allergies indicates no known allergies.  Home Medications   Prior to Admission medications   Medication Sig Start Date End Date Taking? Authorizing Provider  HYDROcodone-acetaminophen (NORCO/VICODIN) 5-325 MG per tablet Take 2 tablets by mouth every 4 (four) hours as needed. 06/26/14   Garald Balding, NP   BP 121/91  Pulse 84  Temp(Src) 98.1 F (36.7  C) (Oral)  Resp 17  SpO2 98% Physical Exam  Nursing note and vitals reviewed. Constitutional: He is oriented to person, place, and time. He appears well-developed and well-nourished. No distress.  HENT:  Head: Normocephalic and atraumatic.  Eyes: Conjunctivae and EOM are normal.  Neck: Normal range of motion. Neck supple.  Cardiovascular: Normal rate.   Pulmonary/Chest: Effort normal. No respiratory distress.  Musculoskeletal: He exhibits edema and tenderness.       Right knee: He exhibits decreased range of motion, swelling and effusion. He exhibits no ecchymosis, no deformity and no erythema. Tenderness found.  Neurological: He is alert and oriented to person, place, and time.  Skin: Skin is warm and dry.  Psychiatric: He has a normal mood and affect. His behavior is normal.    ED Course  Procedures (including critical care time)  DIAGNOSTIC STUDIES: Oxygen Saturation is 97% on RA, normal by my interpretation.    COORDINATION OF CARE:  10:43 PM Will order imaging.  Patient acknowledges and agrees with plan.    Labs Review Labs Reviewed - No data to display  Imaging Review Dg Knee Complete 4 Views Right  06/26/2014   CLINICAL DATA:  Status post fall 2 days ago. Right knee pain and swelling.  EXAM: RIGHT KNEE - COMPLETE 4+ VIEW  COMPARISON:  Two views right knee 09/01/2013.  FINDINGS: The patient has a comminuted but nondisplaced fracture of the patella. The fracture has an oblique component extending from the superior aspect of the patella on the lateral side and inferior in an oblique orientation toward the medial facet. Second component is oblique in orientation toward the lateral side. Associated large joint effusion is identified. No other acute bony or joint abnormality is seen with degenerative disease about the knee noted.  IMPRESSION: Comminuted but nondisplaced fracture of the patella with associated large joint effusion.  Tricompartmental osteoarthritis.    Electronically Signed   By: Inge Rise M.D.   On: 06/26/2014 21:13     EKG Interpretation None     Up with Dr. Tamera Punt, who agrees the plan of a mobilization pain control, and crutches.  He, states he will see the patient in the office on Wednesday MDM   Final diagnoses:  Nondisplaced comminuted fracture of patella, right, closed, initial encounter        I personally performed the services described in this documentation, which was scribed in my presence. The recorded information has been reviewed and is accurate.   Garald Balding, NP 06/26/14 2243

## 2014-06-26 NOTE — ED Notes (Addendum)
Ortho at bedside. Attempted calling patient's friend Pilar Plate with no answer. Bus pass given.

## 2014-06-26 NOTE — ED Notes (Signed)
Patient c/o right knee pain. Took three tylenol around 1500 with no alleviation of symptoms. Had surgery on right knee "many years ago." Says this pain is "on and off." Reports falling on his right knee last Saturday. Full ROM with right knee and foot. No other complaints/concerns.

## 2014-07-01 NOTE — ED Provider Notes (Signed)
Medical screening examination/treatment/procedure(s) were performed by non-physician practitioner and as supervising physician I was immediately available for consultation/collaboration.   EKG Interpretation None       Babette Relic, MD 07/01/14 2203

## 2014-12-07 ENCOUNTER — Emergency Department (HOSPITAL_COMMUNITY): Payer: Medicaid Other

## 2014-12-07 ENCOUNTER — Encounter (HOSPITAL_COMMUNITY): Payer: Self-pay | Admitting: Emergency Medicine

## 2014-12-07 ENCOUNTER — Emergency Department (HOSPITAL_COMMUNITY)
Admission: EM | Admit: 2014-12-07 | Discharge: 2014-12-08 | Disposition: A | Discharge status: Home / Self Care | Payer: Medicaid Other | Attending: Emergency Medicine | Admitting: Emergency Medicine

## 2014-12-07 DIAGNOSIS — Y939 Activity, unspecified: Secondary | ICD-10-CM | POA: Insufficient documentation

## 2014-12-07 DIAGNOSIS — Y998 Other external cause status: Secondary | ICD-10-CM | POA: Diagnosis not present

## 2014-12-07 DIAGNOSIS — E119 Type 2 diabetes mellitus without complications: Secondary | ICD-10-CM | POA: Insufficient documentation

## 2014-12-07 DIAGNOSIS — F1310 Sedative, hypnotic or anxiolytic abuse, uncomplicated: Secondary | ICD-10-CM | POA: Diagnosis not present

## 2014-12-07 DIAGNOSIS — Z720 Tobacco use: Secondary | ICD-10-CM | POA: Insufficient documentation

## 2014-12-07 DIAGNOSIS — X58XXXA Exposure to other specified factors, initial encounter: Secondary | ICD-10-CM | POA: Insufficient documentation

## 2014-12-07 DIAGNOSIS — S4991XA Unspecified injury of right shoulder and upper arm, initial encounter: Secondary | ICD-10-CM | POA: Diagnosis not present

## 2014-12-07 DIAGNOSIS — F1010 Alcohol abuse, uncomplicated: Secondary | ICD-10-CM | POA: Insufficient documentation

## 2014-12-07 DIAGNOSIS — R4182 Altered mental status, unspecified: Secondary | ICD-10-CM | POA: Diagnosis present

## 2014-12-07 DIAGNOSIS — Y9289 Other specified places as the place of occurrence of the external cause: Secondary | ICD-10-CM | POA: Diagnosis not present

## 2014-12-07 DIAGNOSIS — T1490XA Injury, unspecified, initial encounter: Secondary | ICD-10-CM

## 2014-12-07 DIAGNOSIS — F1410 Cocaine abuse, uncomplicated: Secondary | ICD-10-CM | POA: Diagnosis not present

## 2014-12-07 LAB — CBC WITH DIFFERENTIAL/PLATELET
Basophils Absolute: 0 10*3/uL (ref 0.0–0.1)
Basophils Relative: 1 % (ref 0–1)
Eosinophils Absolute: 0.1 10*3/uL (ref 0.0–0.7)
Eosinophils Relative: 2 % (ref 0–5)
HEMATOCRIT: 44.2 % (ref 39.0–52.0)
HEMOGLOBIN: 14.8 g/dL (ref 13.0–17.0)
LYMPHS ABS: 2.9 10*3/uL (ref 0.7–4.0)
Lymphocytes Relative: 56 % — ABNORMAL HIGH (ref 12–46)
MCH: 32.7 pg (ref 26.0–34.0)
MCHC: 33.5 g/dL (ref 30.0–36.0)
MCV: 97.8 fL (ref 78.0–100.0)
MONOS PCT: 7 % (ref 3–12)
Monocytes Absolute: 0.4 10*3/uL (ref 0.1–1.0)
NEUTROS ABS: 1.8 10*3/uL (ref 1.7–7.7)
NEUTROS PCT: 34 % — AB (ref 43–77)
Platelets: 168 10*3/uL (ref 150–400)
RBC: 4.52 MIL/uL (ref 4.22–5.81)
RDW: 13.4 % (ref 11.5–15.5)
WBC: 5.2 10*3/uL (ref 4.0–10.5)

## 2014-12-07 LAB — COMPREHENSIVE METABOLIC PANEL
ALBUMIN: 3.9 g/dL (ref 3.5–5.2)
ALT: 63 U/L — AB (ref 0–53)
AST: 51 U/L — ABNORMAL HIGH (ref 0–37)
Alkaline Phosphatase: 44 U/L (ref 39–117)
Anion gap: 4 — ABNORMAL LOW (ref 5–15)
BUN: 7 mg/dL (ref 6–23)
CHLORIDE: 109 mmol/L (ref 96–112)
CO2: 31 mmol/L (ref 19–32)
Calcium: 9.1 mg/dL (ref 8.4–10.5)
Creatinine, Ser: 0.84 mg/dL (ref 0.50–1.35)
GFR calc Af Amer: >90 mL/min (ref >90)
GFR calc non Af Amer: >90 mL/min (ref >90)
Glucose, Bld: 114 mg/dL — ABNORMAL HIGH (ref 70–99)
Potassium: 4.3 mmol/L (ref 3.5–5.1)
SODIUM: 144 mmol/L (ref 135–145)
Total Bilirubin: 0.6 mg/dL (ref 0.3–1.2)
Total Protein: 7.8 g/dL (ref 6.0–8.3)

## 2014-12-07 LAB — RAPID URINE DRUG SCREEN, HOSP PERFORMED
Amphetamines: NOT DETECTED
BARBITURATES: NOT DETECTED
Benzodiazepines: POSITIVE — AB
Cocaine: POSITIVE — AB
Opiates: NOT DETECTED
Tetrahydrocannabinol: NOT DETECTED

## 2014-12-07 LAB — CBG MONITORING, ED: Glucose-Capillary: 103 mg/dL — ABNORMAL HIGH (ref 70–99)

## 2014-12-07 LAB — ETHANOL: Alcohol, Ethyl (B): 267 mg/dL — ABNORMAL HIGH (ref 0–9)

## 2014-12-07 NOTE — ED Notes (Signed)
Pt arrives via EMS, found down unresponsive in yard by local child. Semi responsive on EMS arrival. Shallow respirations, hypotensive. 1.2 narcan given, pt became more alert, respirations more effective. Unaware of what happened prior to EMS arrival. Ccollar in place, unsure if pt fell. R shoulder pain. States he was "playing with the easter bunny." Bottle of vodka in pocket, admits to pills from friend and a half a pint of ETOH.

## 2014-12-07 NOTE — ED Provider Notes (Signed)
CSN: [id number]     Arrival date & time 12/07/14  2030 History   First MD Initiated Contact with Patient 12/07/14 2042     Chief Complaint  Patient presents with  . Altered Mental Status     (Consider location/radiation/quality/duration/timing/severity/associated sxs/prior Treatment) HPI Comments: Patient here after being found unresponsive in a person's backyard. Admits to EtOH tonight. Also possible opiate use as well. Patient is unsure of how he can on the ground. Exam is limited due to his current intoxicated state. EMS was called and patient transported here with c-collar on. Further history obtainable due to his current state  Patient is a 64 y.o. male presenting with altered mental status. The history is provided by the patient. The history is limited by the condition of the patient.  Altered Mental Status   Past Medical History  Diagnosis Date  . ETOH abuse   . Diabetes mellitus without complication    Past Surgical History  Procedure Laterality Date  . Ankle surgery  2011    right ankle  . Knee surgery     No family history on file. History  Substance Use Topics  . Smoking status: Current Every Day Smoker -- 0.50 packs/day    Types: Cigarettes  . Smokeless tobacco: Not on file  . Alcohol Use: Yes     Comment: daily    Review of Systems  Unable to perform ROS     Allergies  Review of patient's allergies indicates no known allergies.  Home Medications   Prior to Admission medications   Medication Sig Start Date End Date Taking? Authorizing Provider  HYDROcodone-acetaminophen (NORCO/VICODIN) 5-325 MG per tablet Take 2 tablets by mouth every 4 (four) hours as needed. 06/26/14   Junius Creamer, NP   BP 119/92 mmHg  Pulse 84  Temp(Src) 97.8 F (36.6 C) (Oral)  Resp 16  Ht 5\' 6"  (1.676 m)  Wt 137 lb (62.143 kg)  BMI 22.12 kg/m2  SpO2 98% Physical Exam  Constitutional: He is oriented to person, place, and time. He appears well-developed and well-nourished.  He appears lethargic.  Non-toxic appearance. No distress.  HENT:  Head: Normocephalic and atraumatic.  Eyes: Conjunctivae, EOM and lids are normal. Pupils are equal, round, and reactive to light.  Neck: Normal range of motion. Neck supple. No spinous process tenderness and no muscular tenderness present. No tracheal deviation present. No thyroid mass present.  Cardiovascular: Normal rate, regular rhythm and normal heart sounds.  Exam reveals no gallop.   No murmur heard. Pulmonary/Chest: Effort normal and breath sounds normal. No stridor. No respiratory distress. He has no decreased breath sounds. He has no wheezes. He has no rhonchi. He has no rales.  Abdominal: Soft. Normal appearance and bowel sounds are normal. He exhibits no distension. There is no tenderness. There is no rebound and no CVA tenderness.  Musculoskeletal: Normal range of motion. He exhibits no edema or tenderness.  Right shoulder with full range of motion and no deformity noted. Right clavicle nontender. Skin intact no bruising at shoulder  Neurological: He is oriented to person, place, and time. He has normal strength. He appears lethargic. No cranial nerve deficit or sensory deficit. GCS eye subscore is 4. GCS verbal subscore is 5. GCS motor subscore is 6.  Skin: Skin is warm and dry. No abrasion and no rash noted.  Psychiatric: His affect is blunt. His speech is delayed. He is slowed.  Nursing note and vitals reviewed.   ED Course  Procedures (including critical care  time) Labs Review Labs Reviewed  CBG MONITORING, ED - Abnormal; Notable for the following:    Glucose-Capillary 103 (*)    All other components within normal limits  ETHANOL  URINE RAPID DRUG SCREEN (HOSP PERFORMED)  CBC WITH DIFFERENTIAL/PLATELET  COMPREHENSIVE METABOLIC PANEL    Imaging Review No results found.   EKG Interpretation None      MDM   Final diagnoses:  Trauma   Pt to be discharged when clinically sober, no acute findings on  imaging--pt with etoh intoxication    Lacretia Leigh, MD 12/10/14 1558

## 2014-12-07 NOTE — Discharge Instructions (Signed)
Alcohol Intoxication  Alcohol intoxication occurs when the amount of alcohol that a person has consumed impairs his or her ability to mentally and physically function. Alcohol directly impairs the normal chemical activity of the brain. Drinking large amounts of alcohol can lead to changes in mental function and behavior, and it can cause many physical effects that can be harmful.   Alcohol intoxication can range in severity from mild to very severe. Various factors can affect the level of intoxication that occurs, such as the person's age, gender, weight, frequency of alcohol consumption, and the presence of other medical conditions (such as diabetes, seizures, or heart conditions). Dangerous levels of alcohol intoxication may occur when people drink large amounts of alcohol in a short period (binge drinking). Alcohol can also be especially dangerous when combined with certain prescription medicines or "recreational" drugs.  SIGNS AND SYMPTOMS  Some common signs and symptoms of mild alcohol intoxication include:  · Loss of coordination.  · Changes in mood and behavior.  · Impaired judgment.  · Slurred speech.  As alcohol intoxication progresses to more severe levels, other signs and symptoms will appear. These may include:  · Vomiting.  · Confusion and impaired memory.  · Slowed breathing.  · Seizures.  · Loss of consciousness.  DIAGNOSIS   Your health care provider will take a medical history and perform a physical exam. You will be asked about the amount and type of alcohol you have consumed. Blood tests will be done to measure the concentration of alcohol in your blood. In many places, your blood alcohol level must be lower than 80 mg/dL (0.08%) to legally drive. However, many dangerous effects of alcohol can occur at much lower levels.   TREATMENT   People with alcohol intoxication often do not require treatment. Most of the effects of alcohol intoxication are temporary, and they go away as the alcohol naturally  leaves the body. Your health care provider will monitor your condition until you are stable enough to go home. Fluids are sometimes given through an IV access tube to help prevent dehydration.   HOME CARE INSTRUCTIONS  · Do not drive after drinking alcohol.  · Stay hydrated. Drink enough water and fluids to keep your urine clear or pale yellow. Avoid caffeine.    · Only take over-the-counter or prescription medicines as directed by your health care provider.    SEEK MEDICAL CARE IF:   · You have persistent vomiting.    · You do not feel better after a few days.  · You have frequent alcohol intoxication. Your health care provider can help determine if you should see a substance use treatment counselor.  SEEK IMMEDIATE MEDICAL CARE IF:   · You become shaky or tremble when you try to stop drinking.    · You shake uncontrollably (seizure).    · You throw up (vomit) blood. This may be bright red or may look like black coffee grounds.    · You have blood in your stool. This may be bright red or may appear as a black, tarry, bad smelling stool.    · You become lightheaded or faint.    MAKE SURE YOU:   · Understand these instructions.  · Will watch your condition.  · Will get help right away if you are not doing well or get worse.  Document Released: 06/11/2005 Document Revised: 05/04/2013 Document Reviewed: 02/04/2013  [employer]® Patient Information ©2015 [employer]. This information is not intended to replace advice given to you by your health care provider. Make sure   you discuss any questions you have with your health care provider.

## 2017-08-22 ENCOUNTER — Emergency Department (HOSPITAL_COMMUNITY): Payer: Medicaid Other

## 2017-08-22 ENCOUNTER — Encounter (HOSPITAL_COMMUNITY): Payer: Self-pay | Admitting: Emergency Medicine

## 2017-08-22 ENCOUNTER — Other Ambulatory Visit: Payer: Self-pay

## 2017-08-22 ENCOUNTER — Emergency Department (HOSPITAL_COMMUNITY)
Admission: EM | Admit: 2017-08-22 | Discharge: 2017-08-23 | Disposition: A | Discharge status: Home / Self Care | Payer: Medicaid Other | Attending: Emergency Medicine | Admitting: Emergency Medicine

## 2017-08-22 DIAGNOSIS — R1013 Epigastric pain: Secondary | ICD-10-CM | POA: Diagnosis not present

## 2017-08-22 DIAGNOSIS — F17210 Nicotine dependence, cigarettes, uncomplicated: Secondary | ICD-10-CM | POA: Diagnosis not present

## 2017-08-22 DIAGNOSIS — Z7984 Long term (current) use of oral hypoglycemic drugs: Secondary | ICD-10-CM | POA: Insufficient documentation

## 2017-08-22 DIAGNOSIS — M25562 Pain in left knee: Secondary | ICD-10-CM | POA: Diagnosis present

## 2017-08-22 DIAGNOSIS — F1010 Alcohol abuse, uncomplicated: Secondary | ICD-10-CM | POA: Diagnosis not present

## 2017-08-22 DIAGNOSIS — R55 Syncope and collapse: Secondary | ICD-10-CM | POA: Insufficient documentation

## 2017-08-22 DIAGNOSIS — E119 Type 2 diabetes mellitus without complications: Secondary | ICD-10-CM | POA: Insufficient documentation

## 2017-08-22 LAB — COMPREHENSIVE METABOLIC PANEL
ALT: 119 U/L — ABNORMAL HIGH (ref 17–63)
AST: 140 U/L — ABNORMAL HIGH (ref 15–41)
Albumin: 3.6 g/dL (ref 3.5–5.0)
Alkaline Phosphatase: 52 U/L (ref 38–126)
Anion gap: 10 (ref 5–15)
BUN: 11 mg/dL (ref 6–20)
CHLORIDE: 105 mmol/L (ref 101–111)
CO2: 23 mmol/L (ref 22–32)
Calcium: 9.1 mg/dL (ref 8.9–10.3)
Creatinine, Ser: 1.02 mg/dL (ref 0.61–1.24)
GFR calc Af Amer: >60 mL/min (ref >60)
GFR calc non Af Amer: >60 mL/min (ref >60)
Glucose, Bld: 109 mg/dL — ABNORMAL HIGH (ref 65–99)
POTASSIUM: 3.9 mmol/L (ref 3.5–5.1)
Sodium: 138 mmol/L (ref 135–145)
Total Bilirubin: 1.2 mg/dL (ref 0.3–1.2)
Total Protein: 6.8 g/dL (ref 6.5–8.1)

## 2017-08-22 LAB — CBC
HEMATOCRIT: 39.1 % (ref 39.0–52.0)
Hemoglobin: 13.7 g/dL (ref 13.0–17.0)
MCH: 33.3 pg (ref 26.0–34.0)
MCHC: 35 g/dL (ref 30.0–36.0)
MCV: 95.1 fL (ref 78.0–100.0)
Platelets: 123 10*3/uL — ABNORMAL LOW (ref 150–400)
RBC: 4.11 MIL/uL — AB (ref 4.22–5.81)
RDW: 12.9 % (ref 11.5–15.5)
WBC: 4.6 10*3/uL (ref 4.0–10.5)

## 2017-08-22 LAB — URINALYSIS, ROUTINE W REFLEX MICROSCOPIC
Bilirubin Urine: NEGATIVE
Glucose, UA: NEGATIVE mg/dL
Hgb urine dipstick: NEGATIVE
Ketones, ur: 5 mg/dL — AB
LEUKOCYTES UA: NEGATIVE
Nitrite: NEGATIVE
PH: 5 (ref 5.0–8.0)
PROTEIN: NEGATIVE mg/dL
Specific Gravity, Urine: 1.015 (ref 1.005–1.030)

## 2017-08-22 LAB — LIPASE, BLOOD: LIPASE: 77 U/L — AB (ref 11–51)

## 2017-08-22 MED ORDER — HYDROCODONE-ACETAMINOPHEN 5-325 MG PO TABS
1.0000 | ORAL_TABLET | Freq: Once | ORAL | Status: AC
  Administered 2017-08-23: 1 via ORAL
  Filled 2017-08-22: qty 1

## 2017-08-22 NOTE — ED Triage Notes (Signed)
Pt c/o 8/10 abd pain and left knee pain, pt states he fell on his left knee and is hurting him since then, pt c/o having some urinary urgency. No fever or chills.

## 2017-08-23 MED ORDER — SUCRALFATE 1 G PO TABS: 1.0000 g | ORAL_TABLET | Freq: Three times a day (TID) | ORAL | 0 refills | Status: DC

## 2017-08-23 MED ORDER — OMEPRAZOLE 20 MG PO CPDR: 20.0000 mg | DELAYED_RELEASE_CAPSULE | Freq: Every day | ORAL | 0 refills | Status: DC

## 2017-08-23 MED ORDER — SUCRALFATE 1 G PO TABS
1.0000 g | ORAL_TABLET | Freq: Once | ORAL | Status: AC
  Administered 2017-08-23: 1 g via ORAL
  Filled 2017-08-23: qty 1

## 2017-08-23 MED ORDER — METHYLPREDNISOLONE 4 MG PO TBPK: ORAL_TABLET | ORAL | 0 refills | Status: DC

## 2017-08-23 NOTE — ED Notes (Signed)
Pt stood for Orthostatic VS. Steady on feet.

## 2017-08-23 NOTE — ED Provider Notes (Signed)
[facility] Provider Note   CSN: [id number] Arrival date & time: 08/22/17  2222     History   Chief Complaint Chief Complaint  Patient presents with  . Abdominal Pain  . Knee Pain    HPI [name] is a 66 y.o. male.  HPI  This is a 66 year old male with a history of alcohol abuse and diabetes who presents with left knee pain.  Patient reports over the last month he has had multiple episodes of "blacking out."  Denies dizziness or lightheadedness.  Denies chest pain or shortness of breath.  He states over the last 2 weeks he has had worsening left knee pain.  Unsure whether he injured his knee.  Is worse with ambulation.  He reports daily drinking.  He has developed over the last week some epigastric abdominal pain.  Denies nausea, vomiting, fevers, diarrhea.  Current pain is 8 out of 10.  He has not taken anything for the pain.  Past Medical History:  Diagnosis Date  . Diabetes mellitus without complication ([employer])   . ETOH abuse     There are no active problems to display for this patient.   Past Surgical History:  Procedure Laterality Date  . ANKLE SURGERY  2011   right ankle  . KNEE SURGERY         Home Medications    Prior to Admission medications   Medication Sig Start Date End Date Taking? Authorizing Provider  HYDROcodone-acetaminophen (NORCO/VICODIN) 5-325 MG per tablet Take 2 tablets by mouth every 4 (four) hours as needed. 06/26/14   Junius Creamer, NP  metFORMIN (GLUCOPHAGE) 500 MG tablet Take 500 mg by mouth 2 (two) times daily. 11/14/14   [provider]  methylPREDNISolone (MEDROL DOSEPAK) 4 MG TBPK tablet Take as directed on packet 08/23/17   Jonathan Kirkendoll, Barbette Hair, MD  omeprazole (PRILOSEC) 20 MG capsule Take 1 capsule (20 mg total) by mouth daily. 08/23/17   Arvie Bartholomew, Barbette Hair, MD  sucralfate (CARAFATE) 1 g tablet Take 1 tablet (1 g total) by mouth 4 (four) times daily -  with meals and at bedtime. 08/23/17    Darron Stuck, Barbette Hair, MD    Family History History reviewed. No pertinent family history.  Social History Social History   Tobacco Use  . Smoking status: Current Every Day Smoker    Packs/day: 0.50    Types: Cigarettes  . Smokeless tobacco: Never Used  Substance Use Topics  . Alcohol use: Yes    Comment: daily  . Drug use: No     Allergies   Patient has no known allergies.   Review of Systems Review of Systems  Constitutional: Negative for fever.  Respiratory: Negative for shortness of breath.   Cardiovascular: Negative for chest pain.  Gastrointestinal: Positive for abdominal pain. Negative for nausea and vomiting.  Musculoskeletal:       Knee pain  Neurological: Positive for syncope. Negative for headaches.  All other systems reviewed and are negative.    Physical Exam Updated Vital Signs BP (!) 135/96 (BP Location: Right Arm)   Pulse 83   Temp 98.3 F (36.8 C) (Oral)   Resp 18   Ht 5\' 6"  (1.676 m)   Wt 62.1 kg (137 lb)   SpO2 99%   BMI 22.11 kg/m   Physical Exam  Constitutional: He is oriented to person, place, and time.  Chronically ill-appearing, no acute distress  HENT:  Head: Normocephalic and atraumatic.  Neck: Neck supple.  Cardiovascular: Normal rate, regular rhythm and normal heart sounds.  No murmur heard. Pulmonary/Chest: Effort normal and breath sounds normal. No respiratory distress. He has no wheezes.  Abdominal: Soft. Bowel sounds are normal. There is no tenderness. There is no rebound and no guarding.  Musculoskeletal: He exhibits no edema.  Normal range of motion left knee, mild effusion noted, no warmth or erythema, tenderness to palpation over the medial aspect of the knee  Lymphadenopathy:    He has no cervical adenopathy.  Neurological: He is alert and oriented to person, place, and time.  5 out of 5 strength in all 4 extremities, no dysmetria to finger-nose-finger  Skin: Skin is warm and dry.  Psychiatric: He has a normal mood  and affect.  Nursing note and vitals reviewed.    ED Treatments / Results  Labs (all labs ordered are listed, but only abnormal results are displayed) Labs Reviewed  LIPASE, BLOOD - Abnormal; Notable for the following components:      Result Value   Lipase 77 (*)    All other components within normal limits  COMPREHENSIVE METABOLIC PANEL - Abnormal; Notable for the following components:   Glucose, Bld 109 (*)    AST 140 (*)    ALT 119 (*)    All other components within normal limits  CBC - Abnormal; Notable for the following components:   RBC 4.11 (*)    Platelets 123 (*)    All other components within normal limits  URINALYSIS, ROUTINE W REFLEX MICROSCOPIC - Abnormal; Notable for the following components:   Ketones, ur 5 (*)    All other components within normal limits    EKG  EKG Interpretation  Date/Time:  Saturday August 22 2017 23:59:55 EST Ventricular Rate:  68 PR Interval:    QRS Duration: 89 QT Interval:  451 QTC Calculation: 480 R Axis:   -8 Text Interpretation:  Sinus rhythm Borderline low voltage, extremity leads Borderline prolonged QT interval Confirmed by Thayer Jew [phone]) on 08/23/2017 12:29:14 AM       Radiology Dg Knee Complete 4 Views Left  Result Date: 08/22/2017 CLINICAL DATA:  Pain after fall. EXAM: LEFT KNEE - COMPLETE 4+ VIEW COMPARISON:  None. FINDINGS: There is a moderate suprapatellar joint effusion. No identified fracture. IMPRESSION: Moderate suprapatellar joint effusion with no identified fracture. Electronically Signed   By: Dorise Bullion III M.D   On: 08/22/2017 23:15    Procedures Procedures (including critical care time)  Medications Ordered in ED Medications  HYDROcodone-acetaminophen (NORCO/VICODIN) 5-325 MG per tablet 1 tablet (1 tablet Oral Given 08/23/17 0000)  sucralfate (CARAFATE) tablet 1 g (1 g Oral Given 08/23/17 0108)     Initial Impression / Assessment and Plan / ED Course  I have reviewed the triage vital  signs and the nursing notes.  Pertinent labs & imaging results that were available during my care of the patient were reviewed by me and considered in my medical decision making (see chart for details).     Patient presents with acute on chronic complaints including left knee pain, multiple episodes of syncope over the last month, and abdominal pain.  He is a daily drinker.  He is nontoxic on exam.  Vital signs reassuring.  Abdominal exam is benign.  Neurologic exam is reassuring.  EKG without evidence of arrhythmia.  He is not orthostatic.  Suspect his "blacking out" may be related to daily drinking.  His abdominal lab work is largely reassuring.  Lipase is minimally elevated at 77.  Suspect likely gastritis versus mild pancreatitis.  Patient given Carafate and Norco.  Regarding his left knee, there is no signs of active infection or deformity.  X-ray showed effusion.  Given his drinking, he would be at risk for gout.  However, given gastritis, do not feel anti-inflammatories are the best option.  We will send him on a Medrol Dosepak.  Blood sugar 109.  On recheck, he is tolerating fluids without difficulty.  After history, exam, and medical workup I feel the patient has been appropriately medically screened and is safe for discharge home. Pertinent diagnoses were discussed with the patient. Patient was given return precautions.   Final Clinical Impressions(s) / ED Diagnoses   Final diagnoses:  Acute pain of left knee  Epigastric pain  Alcohol abuse  Syncope, unspecified syncope type    ED Discharge Orders        Ordered    sucralfate (CARAFATE) 1 g tablet  3 times daily with meals & bedtime     08/23/17 0113    methylPREDNISolone (MEDROL DOSEPAK) 4 MG TBPK tablet     08/23/17 0113    omeprazole (PRILOSEC) 20 MG capsule  Daily     08/23/17 0113       Merryl Hacker, MD 08/23/17 [phone]

## 2018-01-03 ENCOUNTER — Emergency Department (HOSPITAL_COMMUNITY): Payer: Medicaid Other

## 2018-01-03 ENCOUNTER — Emergency Department (HOSPITAL_COMMUNITY)
Admission: EM | Admit: 2018-01-03 | Discharge: 2018-01-03 | Disposition: A | Discharge status: Home / Self Care | Payer: Medicaid Other | Attending: Emergency Medicine | Admitting: Emergency Medicine

## 2018-01-03 ENCOUNTER — Other Ambulatory Visit: Payer: Self-pay

## 2018-01-03 ENCOUNTER — Encounter (HOSPITAL_COMMUNITY): Payer: Self-pay

## 2018-01-03 DIAGNOSIS — W11XXXA Fall on and from ladder, initial encounter: Secondary | ICD-10-CM | POA: Diagnosis not present

## 2018-01-03 DIAGNOSIS — W19XXXA Unspecified fall, initial encounter: Secondary | ICD-10-CM

## 2018-01-03 DIAGNOSIS — S3992XA Unspecified injury of lower back, initial encounter: Secondary | ICD-10-CM | POA: Diagnosis present

## 2018-01-03 DIAGNOSIS — S32030A Wedge compression fracture of third lumbar vertebra, initial encounter for closed fracture: Secondary | ICD-10-CM | POA: Diagnosis not present

## 2018-01-03 DIAGNOSIS — Y9389 Activity, other specified: Secondary | ICD-10-CM | POA: Diagnosis not present

## 2018-01-03 DIAGNOSIS — Y999 Unspecified external cause status: Secondary | ICD-10-CM | POA: Diagnosis not present

## 2018-01-03 DIAGNOSIS — Y9289 Other specified places as the place of occurrence of the external cause: Secondary | ICD-10-CM | POA: Diagnosis not present

## 2018-01-03 DIAGNOSIS — M542 Cervicalgia: Secondary | ICD-10-CM | POA: Insufficient documentation

## 2018-01-03 DIAGNOSIS — R109 Unspecified abdominal pain: Secondary | ICD-10-CM | POA: Insufficient documentation

## 2018-01-03 DIAGNOSIS — R350 Frequency of micturition: Secondary | ICD-10-CM | POA: Diagnosis not present

## 2018-01-03 DIAGNOSIS — M545 Low back pain, unspecified: Secondary | ICD-10-CM

## 2018-01-03 DIAGNOSIS — R51 Headache: Secondary | ICD-10-CM | POA: Insufficient documentation

## 2018-01-03 DIAGNOSIS — R05 Cough: Secondary | ICD-10-CM | POA: Diagnosis not present

## 2018-01-03 DIAGNOSIS — R197 Diarrhea, unspecified: Secondary | ICD-10-CM | POA: Diagnosis not present

## 2018-01-03 DIAGNOSIS — M79671 Pain in right foot: Secondary | ICD-10-CM | POA: Insufficient documentation

## 2018-01-03 DIAGNOSIS — R6883 Chills (without fever): Secondary | ICD-10-CM | POA: Diagnosis not present

## 2018-01-03 DIAGNOSIS — R0789 Other chest pain: Secondary | ICD-10-CM | POA: Insufficient documentation

## 2018-01-03 LAB — CBC WITH DIFFERENTIAL/PLATELET
Basophils Absolute: 0 10*3/uL (ref 0.0–0.1)
Basophils Relative: 0 %
EOS ABS: 0 10*3/uL (ref 0.0–0.7)
EOS PCT: 0 %
HCT: 42.7 % (ref 39.0–52.0)
HEMOGLOBIN: 14.9 g/dL (ref 13.0–17.0)
LYMPHS ABS: 1.5 10*3/uL (ref 0.7–4.0)
Lymphocytes Relative: 25 %
MCH: 33 pg (ref 26.0–34.0)
MCHC: 34.9 g/dL (ref 30.0–36.0)
MCV: 94.7 fL (ref 78.0–100.0)
Monocytes Absolute: 0.9 10*3/uL (ref 0.1–1.0)
Monocytes Relative: 15 %
Neutro Abs: 3.6 10*3/uL (ref 1.7–7.7)
Neutrophils Relative %: 60 %
PLATELETS: 156 10*3/uL (ref 150–400)
RBC: 4.51 MIL/uL (ref 4.22–5.81)
RDW: 12.6 % (ref 11.5–15.5)
WBC: 6 10*3/uL (ref 4.0–10.5)

## 2018-01-03 LAB — COMPREHENSIVE METABOLIC PANEL
ALK PHOS: 43 U/L (ref 38–126)
ALT: 92 U/L — AB (ref 17–63)
AST: 72 U/L — ABNORMAL HIGH (ref 15–41)
Albumin: 3.4 g/dL — ABNORMAL LOW (ref 3.5–5.0)
Anion gap: 13 (ref 5–15)
BUN: 8 mg/dL (ref 6–20)
CALCIUM: 8.7 mg/dL — AB (ref 8.9–10.3)
CO2: 24 mmol/L (ref 22–32)
CREATININE: 0.71 mg/dL (ref 0.61–1.24)
Chloride: 95 mmol/L — ABNORMAL LOW (ref 101–111)
GFR calc non Af Amer: >60 mL/min (ref >60)
Glucose, Bld: 113 mg/dL — ABNORMAL HIGH (ref 65–99)
Potassium: 3.9 mmol/L (ref 3.5–5.1)
SODIUM: 132 mmol/L — AB (ref 135–145)
Total Bilirubin: 1.8 mg/dL — ABNORMAL HIGH (ref 0.3–1.2)
Total Protein: 7.8 g/dL (ref 6.5–8.1)

## 2018-01-03 LAB — URINALYSIS, ROUTINE W REFLEX MICROSCOPIC
BILIRUBIN URINE: NEGATIVE
GLUCOSE, UA: NEGATIVE mg/dL
HGB URINE DIPSTICK: NEGATIVE
KETONES UR: 80 mg/dL — AB
Leukocytes, UA: NEGATIVE
Nitrite: NEGATIVE
PH: 6 (ref 5.0–8.0)
Protein, ur: NEGATIVE mg/dL

## 2018-01-03 LAB — LIPASE, BLOOD: LIPASE: 45 U/L (ref 11–51)

## 2018-01-03 LAB — PROTIME-INR
INR: 1.03
PROTHROMBIN TIME: 13.4 s (ref 11.4–15.2)

## 2018-01-03 LAB — I-STAT CG4 LACTIC ACID, ED: Lactic Acid, Venous: 0.93 mmol/L (ref 0.5–1.9)

## 2018-01-03 MED ORDER — MORPHINE SULFATE (PF) 4 MG/ML IV SOLN
4.0000 mg | Freq: Once | INTRAVENOUS | Status: AC
  Administered 2018-01-03: 4 mg via INTRAVENOUS
  Filled 2018-01-03: qty 1

## 2018-01-03 MED ORDER — IOPAMIDOL (ISOVUE-300) INJECTION 61%
INTRAVENOUS | Status: AC
  Administered 2018-01-03: 100 mL
  Filled 2018-01-03: qty 100

## 2018-01-03 MED ORDER — OXYCODONE HCL 5 MG PO TABS: 5.0000 mg | ORAL_TABLET | ORAL | 0 refills | Status: DC | PRN

## 2018-01-03 MED ORDER — OXYCODONE HCL 5 MG PO TABS
5.0000 mg | ORAL_TABLET | Freq: Once | ORAL | Status: AC
  Administered 2018-01-03: 5 mg via ORAL
  Filled 2018-01-03: qty 1

## 2018-01-03 NOTE — ED Notes (Signed)
Patient transported to CT 

## 2018-01-03 NOTE — ED Triage Notes (Signed)
Pt fell off ladder yesterday (around 7 steps high) and landed on left side.  Pt states he moved furniture after that in the afternoon but woke up sore this morning.  Mostly low back pain and lower abdominal pain without bruising.  Pain 8/10.

## 2018-01-03 NOTE — ED Notes (Signed)
Pt stable, ambulatory, states understanding of discharge instructions 
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
## 2018-01-03 NOTE — Discharge Instructions (Signed)
Your work-up today showed evidence of a lumbar spine fracture.  Because you are still able to ambulate and your pain is improved, we feel you are safe for discharge home.  Please use the pain medicine to help with your symptoms.  Please follow-up with your primary doctor and a spine doctor for further management.  If any symptoms change or worsen including worsening pain, numbness, tingling, weakness, or loss of control of your bowel or bladders, please return to the nearest emergency department immediately.

## 2018-01-05 LAB — URINE CULTURE

## 2018-03-15 ENCOUNTER — Encounter (HOSPITAL_COMMUNITY): Payer: Self-pay | Admitting: Emergency Medicine

## 2018-03-15 ENCOUNTER — Other Ambulatory Visit: Payer: Self-pay

## 2018-03-15 ENCOUNTER — Emergency Department (HOSPITAL_COMMUNITY)
Admission: EM | Admit: 2018-03-15 | Discharge: 2018-03-16 | Disposition: A | Discharge status: Home / Self Care | Payer: Medicaid Other | Attending: Emergency Medicine | Admitting: Emergency Medicine

## 2018-03-15 DIAGNOSIS — M25561 Pain in right knee: Secondary | ICD-10-CM | POA: Insufficient documentation

## 2018-03-15 DIAGNOSIS — R197 Diarrhea, unspecified: Secondary | ICD-10-CM | POA: Diagnosis not present

## 2018-03-15 DIAGNOSIS — H6121 Impacted cerumen, right ear: Secondary | ICD-10-CM | POA: Diagnosis not present

## 2018-03-15 DIAGNOSIS — F17210 Nicotine dependence, cigarettes, uncomplicated: Secondary | ICD-10-CM | POA: Insufficient documentation

## 2018-03-15 DIAGNOSIS — G8929 Other chronic pain: Secondary | ICD-10-CM | POA: Diagnosis not present

## 2018-03-15 DIAGNOSIS — H9191 Unspecified hearing loss, right ear: Secondary | ICD-10-CM | POA: Diagnosis present

## 2018-03-15 MED ORDER — DOCUSATE SODIUM 50 MG/5ML PO LIQD
50.0000 mg | Freq: Once | ORAL | Status: AC
  Administered 2018-03-16: 50 mg via OTIC
  Filled 2018-03-15: qty 10

## 2018-03-15 NOTE — ED Notes (Signed)
ED Provider at bedside. 

## 2018-03-15 NOTE — ED Triage Notes (Signed)
Patient to ED with multiple complaints lasting at least 6 months - 1 year. Complaints include decreased hearing in R ear, nocturia, L knee pain (without injury), and intermittent diarrhea. States he has an issue with his prostate. Patient does not have a primary doctor and when asked which problem brought him into the ED today, he states all of the above. No acute complaints. Ambulatory with steady gait.

## 2018-03-16 MED ORDER — ACETAMINOPHEN 325 MG PO TABS: 650.0000 mg | ORAL_TABLET | Freq: Four times a day (QID) | ORAL | 0 refills | Status: DC | PRN

## 2018-03-16 NOTE — ED Provider Notes (Signed)
[facility] EMERGENCY DEPARTMENT Provider Note   CSN: [id number] Arrival date & time: 03/15/18  2054     History   Chief Complaint Chief Complaint  Patient presents with  . Multiple Complaints    HPI [name] is a 67 y.o. male.  HPI  This is 67 year old male with a history of diabetes and alcohol abuse who presents with multiple complaints.  Patient reports he has decreased hearing in the right ear, left knee pain, and intermittent diarrhea.  He reports ongoing issues for the last 6 months.  He also reports "prostate issues."  However, I cannot get him to further clarify.  Patient reports he has had decreased hearing in the right ear.  No drainage, no fevers pain.  He also notes left knee pain.  This is ongoing and chronic.  Denies fevers or overlying skin changes.  Denies injury.  Patient reports intermittent nonbloody diarrhea.  Denies recent antibiotic use or exposures.  This is not daily.  He has not taken anything for his symptoms.  Reports that he last drank alcohol on Sunday.  States that "normally just drinks on the weekends."  Patient does not have a primary care physician.  Past Medical History:  Diagnosis Date  . Diabetes mellitus without complication ([place])   . ETOH abuse     There are no active problems to display for this patient.   Past Surgical History:  Procedure Laterality Date  . ANKLE SURGERY  2011   right ankle  . KNEE SURGERY          Home Medications    Prior to Admission medications   Medication Sig Start Date End Date Taking? Authorizing Provider  acetaminophen (TYLENOL) 325 MG tablet Take 2 tablets (650 mg total) by mouth every 6 (six) hours as needed. 03/16/18   Boen Sterbenz, Barbette Hair, MD  HYDROcodone-acetaminophen (NORCO/VICODIN) 5-325 MG per tablet Take 2 tablets by mouth every 4 (four) hours as needed. Patient not taking: Reported on 01/03/2018 06/26/14   Junius Creamer, NP  methylPREDNISolone (MEDROL DOSEPAK) 4 MG TBPK tablet  Take as directed on packet Patient not taking: Reported on 01/03/2018 08/23/17   Kamdyn Covel, Barbette Hair, MD  omeprazole (PRILOSEC) 20 MG capsule Take 1 capsule (20 mg total) by mouth daily. Patient not taking: Reported on 01/03/2018 08/23/17   Keiandre Cygan, Barbette Hair, MD  oxyCODONE (ROXICODONE) 5 MG immediate release tablet Take 1 tablet (5 mg total) by mouth every 4 (four) hours as needed for severe pain. 01/03/18   Tegeler, Gwenyth Allegra, MD  prednisoLONE acetate (PRED FORTE) 1 % ophthalmic suspension Place 1 drop into both eyes 4 (four) times daily.    [provider]  sucralfate (CARAFATE) 1 g tablet Take 1 tablet (1 g total) by mouth 4 (four) times daily -  with meals and at bedtime. Patient not taking: Reported on 01/03/2018 08/23/17   Emigdio Wildeman, Barbette Hair, MD    Family History No family history on file.  Social History Social History   Tobacco Use  . Smoking status: Current Some Day Smoker    Packs/day: 0.50    Types: Cigarettes  . Smokeless tobacco: Never Used  Substance Use Topics  . Alcohol use: Yes    Comment: daily  . Drug use: No     Allergies   Patient has no known allergies.   Review of Systems Review of Systems  Constitutional: Negative for chills and fever.  HENT:       Decreased hearing right ear  Respiratory: Negative for shortness of breath.   Cardiovascular: Negative for chest pain.  Gastrointestinal: Positive for diarrhea. Negative for abdominal pain, nausea and vomiting.  Genitourinary: Negative for dysuria.  Musculoskeletal:       Knee pain  Neurological: Negative for headaches.  All other systems reviewed and are negative.    Physical Exam Updated Vital Signs BP (!) 143/105 (BP Location: Right Arm)   Pulse 83   Temp 98.5 F (36.9 C) (Oral)   Resp 14   SpO2 100%   Physical Exam  Constitutional: He is oriented to person, place, and time.  Nontoxic-appearing  HENT:  Head: Normocephalic and atraumatic.  Cerumen impaction right ear, left ear  clear  Eyes: Pupils are equal, round, and reactive to light.  Neck: Neck supple.  Cardiovascular: Normal rate, regular rhythm and normal heart sounds.  No murmur heard. Pulmonary/Chest: Effort normal and breath sounds normal. No respiratory distress. He has no wheezes.  Abdominal: Soft. Bowel sounds are normal. There is no tenderness. There is no rebound.  Musculoskeletal: He exhibits no edema.  Normal range of motion left knee, no joint line tenderness, no swelling or appreciable overlying skin changes, crepitus noted  Neurological: He is alert and oriented to person, place, and time.  Skin: Skin is warm and dry.  Psychiatric: He has a normal mood and affect.  Nursing note and vitals reviewed.    ED Treatments / Results  Labs (all labs ordered are listed, but only abnormal results are displayed) Labs Reviewed - No data to display  EKG None  Radiology No results found.  Procedures Procedures (including critical care time)  Medications Ordered in ED Medications  docusate (COLACE) 50 MG/5ML liquid 50 mg (50 mg Right EAR Given 03/16/18 0113)     Initial Impression / Assessment and Plan / ED Course  I have reviewed the triage vital signs and the nursing notes.  Pertinent labs & imaging results that were available during my care of the patient were reviewed by me and considered in my medical decision making (see chart for details).     Patient presents with multiple complaints.  Most are chronic.  He does not have a primary care physician.  His vital signs are largely reassuring.  Blood pressure mildly elevated at 143/105.  He has right cerumen impaction.  The left knee pain is chronic.  No signs or symptoms of infection or effusion.  Low suspicion for septic arthritis.  He is otherwise neurovascular intact.  He also complains of intermittent diarrhea.  His abdomen is soft and nontender.  At this time I do not feel he needs an extensive work-up.  His serum impaction was cleared by  nursing.  Repeat exam with intact tympanic membrane.  [facility].  After history, exam, and medical workup I feel the patient has been appropriately medically screened and is safe for discharge home. Pertinent diagnoses were discussed with the patient. Patient was given return precautions.   Final Clinical Impressions(s) / ED Diagnoses   Final diagnoses:  Impacted cerumen of right ear  Chronic pain of right knee  Intermittent diarrhea    ED Discharge Orders        Ordered    acetaminophen (TYLENOL) 325 MG tablet  Every 6 hours PRN     03/16/18 0136       Merryl Hacker, MD 03/16/18 0139

## 2018-03-16 NOTE — Discharge Instructions (Addendum)
You were seen today for multiple complaints.  It is very important that you follow-up and establish primary care to address these complaints.

## 2018-05-31 ENCOUNTER — Encounter (HOSPITAL_COMMUNITY): Payer: Self-pay | Admitting: Emergency Medicine

## 2018-05-31 ENCOUNTER — Emergency Department (HOSPITAL_COMMUNITY): Payer: Medicaid Other

## 2018-05-31 ENCOUNTER — Emergency Department (HOSPITAL_COMMUNITY)
Admission: EM | Admit: 2018-05-31 | Discharge: 2018-05-31 | Disposition: A | Discharge status: Home / Self Care | Payer: Medicaid Other | Attending: Emergency Medicine | Admitting: Emergency Medicine

## 2018-05-31 DIAGNOSIS — S40212A Abrasion of left shoulder, initial encounter: Secondary | ICD-10-CM | POA: Diagnosis not present

## 2018-05-31 DIAGNOSIS — Y929 Unspecified place or not applicable: Secondary | ICD-10-CM | POA: Diagnosis not present

## 2018-05-31 DIAGNOSIS — Z23 Encounter for immunization: Secondary | ICD-10-CM | POA: Insufficient documentation

## 2018-05-31 DIAGNOSIS — Z79899 Other long term (current) drug therapy: Secondary | ICD-10-CM | POA: Insufficient documentation

## 2018-05-31 DIAGNOSIS — Y939 Activity, unspecified: Secondary | ICD-10-CM | POA: Insufficient documentation

## 2018-05-31 DIAGNOSIS — S62396A Other fracture of fifth metacarpal bone, right hand, initial encounter for closed fracture: Secondary | ICD-10-CM | POA: Diagnosis not present

## 2018-05-31 DIAGNOSIS — E119 Type 2 diabetes mellitus without complications: Secondary | ICD-10-CM | POA: Diagnosis not present

## 2018-05-31 DIAGNOSIS — F17210 Nicotine dependence, cigarettes, uncomplicated: Secondary | ICD-10-CM | POA: Insufficient documentation

## 2018-05-31 DIAGNOSIS — Y999 Unspecified external cause status: Secondary | ICD-10-CM | POA: Diagnosis not present

## 2018-05-31 DIAGNOSIS — T07XXXA Unspecified multiple injuries, initial encounter: Secondary | ICD-10-CM

## 2018-05-31 DIAGNOSIS — S50811A Abrasion of right forearm, initial encounter: Secondary | ICD-10-CM | POA: Insufficient documentation

## 2018-05-31 DIAGNOSIS — S6991XA Unspecified injury of right wrist, hand and finger(s), initial encounter: Secondary | ICD-10-CM | POA: Diagnosis present

## 2018-05-31 LAB — COMPREHENSIVE METABOLIC PANEL
ALT: 76 U/L — ABNORMAL HIGH (ref 0–44)
AST: 88 U/L — ABNORMAL HIGH (ref 15–41)
Albumin: 3.6 g/dL (ref 3.5–5.0)
Alkaline Phosphatase: 45 U/L (ref 38–126)
Anion gap: 13 (ref 5–15)
BUN: 7 mg/dL — ABNORMAL LOW (ref 8–23)
CO2: 24 mmol/L (ref 22–32)
Calcium: 9.2 mg/dL (ref 8.9–10.3)
Chloride: 102 mmol/L (ref 98–111)
Creatinine, Ser: 0.69 mg/dL (ref 0.61–1.24)
GFR calc Af Amer: >60 mL/min (ref >60)
GFR calc non Af Amer: >60 mL/min (ref >60)
Glucose, Bld: 112 mg/dL — ABNORMAL HIGH (ref 70–99)
Potassium: 3.6 mmol/L (ref 3.5–5.1)
Sodium: 139 mmol/L (ref 135–145)
Total Bilirubin: 1.8 mg/dL — ABNORMAL HIGH (ref 0.3–1.2)
Total Protein: 8.1 g/dL (ref 6.5–8.1)

## 2018-05-31 LAB — LIPASE, BLOOD: Lipase: 47 U/L (ref 11–51)

## 2018-05-31 LAB — URINALYSIS, ROUTINE W REFLEX MICROSCOPIC
Bilirubin Urine: NEGATIVE
Glucose, UA: NEGATIVE mg/dL
Hgb urine dipstick: NEGATIVE
Ketones, ur: 5 mg/dL — AB
Leukocytes, UA: NEGATIVE
Nitrite: NEGATIVE
Protein, ur: NEGATIVE mg/dL
Specific Gravity, Urine: 1.014 (ref 1.005–1.030)
pH: 6 (ref 5.0–8.0)

## 2018-05-31 LAB — CBC
HCT: 45.4 % (ref 39.0–52.0)
Hemoglobin: 15.4 g/dL (ref 13.0–17.0)
MCH: 32.9 pg (ref 26.0–34.0)
MCHC: 33.9 g/dL (ref 30.0–36.0)
MCV: 97 fL (ref 78.0–100.0)
Platelets: 141 10*3/uL — ABNORMAL LOW (ref 150–400)
RBC: 4.68 MIL/uL (ref 4.22–5.81)
RDW: 12.5 % (ref 11.5–15.5)
WBC: 3.9 10*3/uL — ABNORMAL LOW (ref 4.0–10.5)

## 2018-05-31 MED ORDER — OXYCODONE-ACETAMINOPHEN 5-325 MG PO TABS
1.0000 | ORAL_TABLET | Freq: Once | ORAL | Status: AC
  Administered 2018-05-31: 1 via ORAL
  Filled 2018-05-31: qty 1

## 2018-05-31 MED ORDER — IBUPROFEN 400 MG PO TABS
600.0000 mg | ORAL_TABLET | Freq: Once | ORAL | Status: AC
  Administered 2018-05-31: 600 mg via ORAL
  Filled 2018-05-31: qty 1

## 2018-05-31 MED ORDER — OXYCODONE-ACETAMINOPHEN 5-325 MG PO TABS: 1.0000 | ORAL_TABLET | Freq: Four times a day (QID) | ORAL | 0 refills | Status: DC | PRN

## 2018-05-31 MED ORDER — TETANUS-DIPHTH-ACELL PERTUSSIS 5-2.5-18.5 LF-MCG/0.5 IM SUSP
0.5000 mL | Freq: Once | INTRAMUSCULAR | Status: AC
  Administered 2018-05-31: 0.5 mL via INTRAMUSCULAR
  Filled 2018-05-31: qty 0.5

## 2018-05-31 MED ORDER — IBUPROFEN 600 MG PO TABS: 600.0000 mg | ORAL_TABLET | Freq: Four times a day (QID) | ORAL | 0 refills | Status: DC | PRN

## 2018-05-31 NOTE — Progress Notes (Signed)
Orthopedic Tech Progress Note Patient Details:  [name] August 06, 1951 [id number]  Ortho Devices Type of Ortho Device: Ace wrap, Post (short arm) splint Ortho Device/Splint Interventions: Application   Post Interventions Patient Tolerated: Well Instructions Provided: Care of device   [place] Pink 05/31/2018, 4:15 PM
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
## 2018-06-08 ENCOUNTER — Emergency Department (HOSPITAL_COMMUNITY)
Admission: EM | Admit: 2018-06-08 | Discharge: 2018-06-08 | Disposition: A | Discharge status: Home / Self Care | Payer: Medicaid Other | Attending: Emergency Medicine | Admitting: Emergency Medicine

## 2018-06-08 ENCOUNTER — Emergency Department (HOSPITAL_COMMUNITY): Payer: Medicaid Other

## 2018-06-08 ENCOUNTER — Encounter (HOSPITAL_COMMUNITY): Payer: Self-pay | Admitting: *Deleted

## 2018-06-08 DIAGNOSIS — Y939 Activity, unspecified: Secondary | ICD-10-CM | POA: Insufficient documentation

## 2018-06-08 DIAGNOSIS — X58XXXA Exposure to other specified factors, initial encounter: Secondary | ICD-10-CM | POA: Diagnosis not present

## 2018-06-08 DIAGNOSIS — S6291XA Unspecified fracture of right wrist and hand, initial encounter for closed fracture: Secondary | ICD-10-CM | POA: Insufficient documentation

## 2018-06-08 DIAGNOSIS — F17210 Nicotine dependence, cigarettes, uncomplicated: Secondary | ICD-10-CM | POA: Insufficient documentation

## 2018-06-08 DIAGNOSIS — Y929 Unspecified place or not applicable: Secondary | ICD-10-CM | POA: Diagnosis not present

## 2018-06-08 DIAGNOSIS — E119 Type 2 diabetes mellitus without complications: Secondary | ICD-10-CM | POA: Insufficient documentation

## 2018-06-08 DIAGNOSIS — Y999 Unspecified external cause status: Secondary | ICD-10-CM | POA: Insufficient documentation

## 2018-06-08 DIAGNOSIS — S6291XG Unspecified fracture of right wrist and hand, subsequent encounter for fracture with delayed healing: Secondary | ICD-10-CM

## 2018-06-08 DIAGNOSIS — Z79899 Other long term (current) drug therapy: Secondary | ICD-10-CM | POA: Insufficient documentation

## 2018-06-08 NOTE — ED Notes (Signed)
Ortho at bedside.

## 2018-06-08 NOTE — Progress Notes (Signed)
Orthopedic Tech Progress Note Patient Details:  [name] [birth year] [id number]  Ortho Devices Type of Ortho Device: Volar splint Ortho Device/Splint Location: rue Ortho Device/Splint Interventions: Application   Post Interventions Patient Tolerated: Well Instructions Provided: Care of device   [name] 06/08/2018, 6:05 PM

## 2018-06-08 NOTE — ED Notes (Signed)
Ortho paged. 

## 2018-06-08 NOTE — Discharge Instructions (Addendum)
Follow-up with the hand surgeon as you have been previously instructed

## 2018-06-08 NOTE — ED Notes (Signed)
X-ray at bedside

## 2018-06-08 NOTE — ED Provider Notes (Signed)
[name] EMERGENCY DEPARTMENT Provider Note   CSN: [id number] Arrival date & time: 06/08/18  1045     History   Chief Complaint Chief Complaint  Patient presents with  . Arm Pain    HPI [name] is a 67 y.o. male.  67 year old male who presents complaining of right hand pain.  Patient seen 12 days ago for a fracture of his fifth metacarpal.  Splint was applied.  He was given referral for orthopedics for definitive care but has not kept that appointment.  Patient is requesting a new splint.  Denies any numbness or tingling to his right hand.  No fever chills or drainage.     Past Medical History:  Diagnosis Date  . Diabetes mellitus without complication ([place])   . ETOH abuse     There are no active problems to display for this patient.   Past Surgical History:  Procedure Laterality Date  . ANKLE SURGERY  2011   right ankle  . KNEE SURGERY          Home Medications    Prior to Admission medications   Medication Sig Start Date End Date Taking? Authorizing Provider  ibuprofen (ADVIL,MOTRIN) 600 MG tablet Take 1 tablet (600 mg total) by mouth every 6 (six) hours as needed. Patient taking differently: Take 600 mg by mouth every 6 (six) hours as needed for moderate pain.  05/31/18  Yes Virgel Manifold, MD  latanoprost (XALATAN) 0.005 % ophthalmic solution Place 1 drop into the right eye at bedtime.   Yes [provider]  prednisoLONE acetate (PRED FORTE) 1 % ophthalmic suspension Place 1 drop into the right eye 4 (four) times daily.    Yes [provider]  acetaminophen (TYLENOL) 325 MG tablet Take 2 tablets (650 mg total) by mouth every 6 (six) hours as needed. 03/16/18   Horton, Barbette Hair, MD  HYDROcodone-acetaminophen (NORCO/VICODIN) 5-325 MG per tablet Take 2 tablets by mouth every 4 (four) hours as needed. Patient not taking: Reported on 01/03/2018 06/26/14   Junius Creamer, NP  methylPREDNISolone (MEDROL DOSEPAK) 4 MG TBPK  tablet Take as directed on packet Patient not taking: Reported on 01/03/2018 08/23/17   Horton, Barbette Hair, MD  omeprazole (PRILOSEC) 20 MG capsule Take 1 capsule (20 mg total) by mouth daily. Patient not taking: Reported on 01/03/2018 08/23/17   Horton, Barbette Hair, MD  oxyCODONE (ROXICODONE) 5 MG immediate release tablet Take 1 tablet (5 mg total) by mouth every 4 (four) hours as needed for severe pain. Patient not taking: Reported on 06/08/2018 01/03/18   Tegeler, Gwenyth Allegra, MD  oxyCODONE-acetaminophen (PERCOCET/ROXICET) 5-325 MG tablet Take 1-2 tablets by mouth every 6 (six) hours as needed for severe pain. 05/31/18   Virgel Manifold, MD  sucralfate (CARAFATE) 1 g tablet Take 1 tablet (1 g total) by mouth 4 (four) times daily -  with meals and at bedtime. Patient not taking: Reported on 01/03/2018 08/23/17   Horton, Barbette Hair, MD    Family History No family history on file.  Social History Social History   Tobacco Use  . Smoking status: Current Some Day Smoker    Packs/day: 0.30    Types: Cigarettes  . Smokeless tobacco: Never Used  Substance Use Topics  . Alcohol use: Yes    Comment: denies ETOH use 06/08/18  . Drug use: No     Allergies   Patient has no known allergies.   Review of Systems Review of Systems  All other systems  reviewed and are negative.    Physical Exam Updated Vital Signs BP (!) 136/106 (BP Location: Left Arm)   Pulse 65   Temp 99.1 F (37.3 C) (Oral)   Resp 14   Ht 1.676 m (5\' 6" )   Wt 65.3 kg   SpO2 100%   BMI 23.24 kg/m   Physical Exam  Constitutional: He is oriented to person, place, and time. He appears well-developed and well-nourished.  Non-toxic appearance. No distress.  HENT:  Head: Normocephalic and atraumatic.  Eyes: Pupils are equal, round, and reactive to light. Conjunctivae, EOM and lids are normal.  Neck: Normal range of motion. Neck supple. No tracheal deviation present. No thyroid mass present.  Cardiovascular: Normal rate,  regular rhythm and normal heart sounds. Exam reveals no gallop.  No murmur heard. Pulmonary/Chest: Effort normal and breath sounds normal. No stridor. No respiratory distress. He has no decreased breath sounds. He has no wheezes. He has no rhonchi. He has no rales.  Abdominal: Soft. Normal appearance and bowel sounds are normal. He exhibits no distension. There is no tenderness. There is no rebound and no CVA tenderness.  Musculoskeletal: Normal range of motion. He exhibits no edema or tenderness.       Right hand: He exhibits swelling. He exhibits normal capillary refill and no deformity.  Neurological: He is alert and oriented to person, place, and time. He has normal strength. No cranial nerve deficit or sensory deficit. GCS eye subscore is 4. GCS verbal subscore is 5. GCS motor subscore is 6.  Skin: Skin is warm and dry. No abrasion and no rash noted.  Psychiatric: He has a normal mood and affect. His speech is normal and behavior is normal.  Nursing note and vitals reviewed.    ED Treatments / Results  Labs (all labs ordered are listed, but only abnormal results are displayed) Labs Reviewed - No data to display  EKG None  Radiology No results found.  Procedures Procedures (including critical care time)  Medications Ordered in ED Medications - No data to display   Initial Impression / Assessment and Plan / ED Course  I have reviewed the triage vital signs and the nursing notes.  Pertinent labs & imaging results that were available during my care of the patient were reviewed by me and considered in my medical decision making (see chart for details).     Hand x-ray negative for any fractures.  Splint replaced by orthopedics.  Patient to follow-up with hand surgery as scheduled  Final Clinical Impressions(s) / ED Diagnoses   Final diagnoses:  None    ED Discharge Orders    None       Lacretia Leigh, MD 06/08/18 [phone]
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
## 2018-06-19 ENCOUNTER — Emergency Department (HOSPITAL_COMMUNITY): Payer: Medicare Other

## 2018-06-19 ENCOUNTER — Emergency Department (HOSPITAL_COMMUNITY)
Admission: EM | Admit: 2018-06-19 | Discharge: 2018-06-20 | Disposition: A | Discharge status: Home / Self Care | Payer: Medicare Other | Attending: Emergency Medicine | Admitting: Emergency Medicine

## 2018-06-19 ENCOUNTER — Other Ambulatory Visit: Payer: Self-pay

## 2018-06-19 ENCOUNTER — Encounter (HOSPITAL_COMMUNITY): Payer: Self-pay | Admitting: Emergency Medicine

## 2018-06-19 DIAGNOSIS — E119 Type 2 diabetes mellitus without complications: Secondary | ICD-10-CM | POA: Insufficient documentation

## 2018-06-19 DIAGNOSIS — Y999 Unspecified external cause status: Secondary | ICD-10-CM | POA: Diagnosis not present

## 2018-06-19 DIAGNOSIS — W19XXXA Unspecified fall, initial encounter: Secondary | ICD-10-CM | POA: Diagnosis not present

## 2018-06-19 DIAGNOSIS — Z79899 Other long term (current) drug therapy: Secondary | ICD-10-CM | POA: Diagnosis not present

## 2018-06-19 DIAGNOSIS — F17210 Nicotine dependence, cigarettes, uncomplicated: Secondary | ICD-10-CM | POA: Diagnosis not present

## 2018-06-19 DIAGNOSIS — S62316A Displaced fracture of base of fifth metacarpal bone, right hand, initial encounter for closed fracture: Secondary | ICD-10-CM

## 2018-06-19 DIAGNOSIS — F10929 Alcohol use, unspecified with intoxication, unspecified: Secondary | ICD-10-CM | POA: Diagnosis present

## 2018-06-19 DIAGNOSIS — Y929 Unspecified place or not applicable: Secondary | ICD-10-CM | POA: Insufficient documentation

## 2018-06-19 DIAGNOSIS — F10920 Alcohol use, unspecified with intoxication, uncomplicated: Secondary | ICD-10-CM | POA: Diagnosis not present

## 2018-06-19 DIAGNOSIS — F14920 Cocaine use, unspecified with intoxication, uncomplicated: Secondary | ICD-10-CM | POA: Diagnosis not present

## 2018-06-19 DIAGNOSIS — Y908 Blood alcohol level of 240 mg/100 ml or more: Secondary | ICD-10-CM | POA: Insufficient documentation

## 2018-06-19 DIAGNOSIS — Y939 Activity, unspecified: Secondary | ICD-10-CM | POA: Insufficient documentation

## 2018-06-19 LAB — COMPREHENSIVE METABOLIC PANEL
ALBUMIN: 3.8 g/dL (ref 3.5–5.0)
ALT: 84 U/L — AB (ref 0–44)
AST: 93 U/L — AB (ref 15–41)
Alkaline Phosphatase: 49 U/L (ref 38–126)
Anion gap: 12 (ref 5–15)
BILIRUBIN TOTAL: 0.6 mg/dL (ref 0.3–1.2)
BUN: 8 mg/dL (ref 8–23)
CHLORIDE: 111 mmol/L (ref 98–111)
CO2: 23 mmol/L (ref 22–32)
CREATININE: 0.52 mg/dL — AB (ref 0.61–1.24)
Calcium: 8.9 mg/dL (ref 8.9–10.3)
GFR calc Af Amer: >60 mL/min (ref >60)
GLUCOSE: 96 mg/dL (ref 70–99)
Potassium: 3.6 mmol/L (ref 3.5–5.1)
Sodium: 146 mmol/L — ABNORMAL HIGH (ref 135–145)
Total Protein: 7.7 g/dL (ref 6.5–8.1)

## 2018-06-19 LAB — CBC WITH DIFFERENTIAL/PLATELET
BASOS ABS: 0 10*3/uL (ref 0.0–0.1)
BASOS PCT: 0 %
Eosinophils Absolute: 0 10*3/uL (ref 0.0–0.7)
Eosinophils Relative: 1 %
HEMATOCRIT: 45.3 % (ref 39.0–52.0)
HEMOGLOBIN: 15.6 g/dL (ref 13.0–17.0)
Lymphocytes Relative: 62 %
Lymphs Abs: 2.1 10*3/uL (ref 0.7–4.0)
MCH: 33 pg (ref 26.0–34.0)
MCHC: 34.4 g/dL (ref 30.0–36.0)
MCV: 95.8 fL (ref 78.0–100.0)
MONO ABS: 0.2 10*3/uL (ref 0.1–1.0)
Monocytes Relative: 6 %
Neutro Abs: 1.1 10*3/uL — ABNORMAL LOW (ref 1.7–7.7)
Neutrophils Relative %: 31 %
Platelets: 169 10*3/uL (ref 150–400)
RBC: 4.73 MIL/uL (ref 4.22–5.81)
RDW: 13.3 % (ref 11.5–15.5)
WBC: 3.5 10*3/uL — ABNORMAL LOW (ref 4.0–10.5)

## 2018-06-19 LAB — RAPID URINE DRUG SCREEN, HOSP PERFORMED
AMPHETAMINES: NOT DETECTED
Barbiturates: NOT DETECTED
Benzodiazepines: NOT DETECTED
Cocaine: POSITIVE — AB
OPIATES: NOT DETECTED
Tetrahydrocannabinol: NOT DETECTED

## 2018-06-19 LAB — TROPONIN I

## 2018-06-19 LAB — ETHANOL: Alcohol, Ethyl (B): 288 mg/dL — ABNORMAL HIGH (ref <10)

## 2018-06-19 MED ORDER — SODIUM CHLORIDE 0.9 % IV BOLUS
1000.0000 mL | Freq: Once | INTRAVENOUS | Status: AC
  Administered 2018-06-19: 1000 mL via INTRAVENOUS
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
## 2018-06-19 NOTE — Discharge Instructions (Addendum)
Please see the information and instructions below regarding your visit.  Your diagnoses today include:  1. Alcoholic intoxication without complication ([place])   2. Cocaine intoxication without complication ([place])     Tests performed today include: See side panel of your discharge paperwork for testing performed today. Vital signs are listed at the bottom of these instructions.   He is to have a break in your hand.  This is followed.  You need to call Dr. Lenon Curt as soon as possible.  He is listed on your paperwork.  Medications prescribed:    Take any prescribed medications only as prescribed, and any over the counter medications only as directed on the packaging.  Home care instructions:  Please follow any educational materials contained in this packet.   Follow-up instructions: Please follow-up with your primary care provider as soon as possible for further evaluation of your symptoms if they are not completely improved. I placed a consult to case management.  Please follow up with Dr. Lenon Curt of Hand surgery as soon as possible.   Return instructions:  Please return to the Emergency Department if you experience worsening symptoms.  Please return the emergency department if you have any worsening hand pain, hand swelling, or loss of sensation in your fingers. Please return if you have any other emergent concerns.  Additional Information:   Your vital signs today were: BP 121/73 (BP Location: Right Arm)    Pulse 72    Temp 97.6 F (36.4 C) (Oral)    Resp 18    SpO2 97%  If your blood pressure (BP) was elevated on multiple readings during this visit above 130 for the top number or above 80 for the bottom number, please have this repeated by your primary care provider within one month. --------------  Thank you for allowing [place] to participate in your care today.

## 2018-06-19 NOTE — ED Notes (Signed)
Bed: WA27 Expected date: 06/19/18 Expected time: 2:26 PM Means of arrival: Ambulance Comments: ETOH and Cocaine use

## 2018-06-19 NOTE — ED Notes (Signed)
NT arrived to draw blood, staff informed NT that pt was taken to CT

## 2018-06-19 NOTE — ED Notes (Signed)
Pt was observed trying to unwrap his cast from his arm. Pt asked not to do so. Pt stated "It needs to be changed."
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
## 2018-06-20 DIAGNOSIS — F10920 Alcohol use, unspecified with intoxication, uncomplicated: Secondary | ICD-10-CM | POA: Diagnosis not present

## 2018-06-20 LAB — CBG MONITORING, ED: GLUCOSE-CAPILLARY: 124 mg/dL — AB (ref 70–99)

## 2018-11-30 ENCOUNTER — Other Ambulatory Visit: Payer: Self-pay

## 2018-11-30 ENCOUNTER — Other Ambulatory Visit (INDEPENDENT_AMBULATORY_CARE_PROVIDER_SITE_OTHER): Payer: Medicare HMO

## 2018-11-30 ENCOUNTER — Encounter: Payer: Self-pay | Admitting: Gastroenterology

## 2018-11-30 ENCOUNTER — Ambulatory Visit (INDEPENDENT_AMBULATORY_CARE_PROVIDER_SITE_OTHER): Payer: Medicare HMO | Admitting: Gastroenterology

## 2018-11-30 VITALS — BP 160/90 | HR 83 | Ht 68.0 in | Wt 139.0 lb

## 2018-11-30 DIAGNOSIS — Z800 Family history of malignant neoplasm of digestive organs: Secondary | ICD-10-CM

## 2018-11-30 DIAGNOSIS — R194 Change in bowel habit: Secondary | ICD-10-CM

## 2018-11-30 DIAGNOSIS — K760 Fatty (change of) liver, not elsewhere classified: Secondary | ICD-10-CM

## 2018-11-30 DIAGNOSIS — F1010 Alcohol abuse, uncomplicated: Secondary | ICD-10-CM

## 2018-11-30 LAB — IBC + FERRITIN
Ferritin: 581.6 ng/mL — ABNORMAL HIGH (ref 22.0–322.0)
Iron: 262 ug/dL — ABNORMAL HIGH (ref 42–165)
SATURATION RATIOS: 64.8 % — AB (ref 20.0–50.0)
Transferrin: 289 mg/dL (ref 212.0–360.0)

## 2018-11-30 MED ORDER — SUPREP BOWEL PREP KIT 17.5-3.13-1.6 GM/177ML PO SOLN: ORAL | 0 refills | Status: DC

## 2018-11-30 NOTE — Progress Notes (Signed)
HPI :  68 y/o male with a history of diabetes, alcohol abuse, fatty liver, referred here for a new patient visit by Dr. [name] for family history of colon cancer.   The patient is never had a prior colonoscopy. He states his brother was recently diagnosed with colon cancer at the age of 78. He reports his stools have been more frequent looser than usual, currently having 3 bowel movements a day, although more recently he thinks stool form has come back to normal. He denies any blood in his stools. He denies any pain in his abdomen. He thinks he lost a few pounds in recent months but nothing significant. He is eating regularly, no nausea or vomiting. He does have a history of tobacco use, smokes about 3-4 cigarettes a day. He does endorse long-standing alcohol use, drinks 2 x 40 ounce beers / day longstanding. He's had prior imaging showing fatty liver disease but no cirrhosis based off most recent imaging in April of last year. He is accompanied by family member today. He has omeprazole written as prescription however she states she doesn't take it. He denies any reflux, dysphagia, or upper abdominal pain after eating. He denies frequent use of NSAIDs. Uses rare NSAID or Tylenol as needed for joint pain. He denies a family history of liver disease.  Most recent labs provided by his primary care show that his AST is 74, ALT 72, alkaline phosphatase 49, bilirubin 0.7. Hemoglobin 14.8, MCV 92.3, platelet count 178. Vitamin B12 and folate levels were normal  CT abdomen / pelvis - 01/03/18 - fatty liver, no focal abnormality otherwise, dense coronary calcification, diverticulosis   Past Medical History:  Diagnosis Date  . Diabetes mellitus without complication ([place])   . ETOH abuse   . Frequent urination   . Glaucoma      Past Surgical History:  Procedure Laterality Date  . ANKLE SURGERY  2011   right ankle  . KNEE SURGERY     Family History  Problem Relation Age of Onset  . Cancer  Sister        ??????  . Colon cancer Brother 17       ????   Social History   Tobacco Use  . Smoking status: Current Every Day Smoker    Packs/day: 0.30    Types: Cigarettes  . Smokeless tobacco: Never Used  Substance Use Topics  . Alcohol use: Yes    Comment: denies ETOH use 06/08/18  . Drug use: Yes    Comment: uses crack    Current Outpatient Medications  Medication Sig Dispense Refill  . ibuprofen (ADVIL,MOTRIN) 600 MG tablet Take 1 tablet (600 mg total) by mouth every 6 (six) hours as needed. 30 tablet 0  . latanoprost (XALATAN) 0.005 % ophthalmic solution Place 1 drop into the right eye at bedtime.    Marland Kitchen omeprazole (PRILOSEC) 20 MG capsule Take 1 capsule (20 mg total) by mouth daily. 30 capsule 0  . prednisoLONE acetate (PRED FORTE) 1 % ophthalmic suspension Place 1 drop into the right eye 4 (four) times daily.     Manus Gunning BOWEL PREP KIT 17.5-3.13-1.6 GM/177ML SOLN Suprep-Use as directed 354 mL 0   No current facility-administered medications for this visit.    No Known Allergies   Review of Systems: All systems reviewed and negative except where noted in HPI.   Per HPI  Physical Exam: BP (!) 160/90   Pulse 83   Ht _0  (1.727 m)  Wt 139 lb (63 kg)   BMI 21.13 kg/m  Constitutional: Pleasant,well-developed, male in no acute distress. HEENT: Normocephalic and atraumatic. Conjunctivae are normal. No scleral icterus. Neck supple.  Cardiovascular: Normal rate, regular rhythm.  Pulmonary/chest: Effort normal and breath sounds normal. No wheezing, rales or rhonchi. Abdominal: Soft, nondistended, nontender. There are no masses palpable. No hepatomegaly. Extremities: no edema Lymphadenopathy: No cervical adenopathy noted. Neurological: Alert and oriented to person place and time. Skin: Skin is warm and dry. No rashes noted. Psychiatric: Normal mood and affect. Behavior is normal.   ASSESSMENT AND PLAN: 68 year old male here for new patient assessment of the  following:  Family history of colon cancer / change in bowel habits - no prior colon cancer screening. An optical colonoscopy is recommended for his screening given his family history as well as recent change in bowel habits, although his bowels appear to be going back to normal more recently. He has no alarm symptoms or anemia. I discussed risks and benefits of colonoscopy wants to proceed. Given his age and the current COVID-19 crisis, will plan on doing this in several weeks or a few months once things have settled down. He agreed.  Alcohol abuse / fatty liver - I discussed his alcohol use with him and reviewed his lab results and imaging with him. He has fatty liver due to alcohol, I'm concerned about his long-term risk for cirrhosis given the findings on imaging in his labs. I don't see prior testing for hepatitis or other chronic liver diseases. I will send serologies for hepatitis B, and C, and check his immunity to hep A and B. We will send labs for autoimmune liver disease and iron studies to complete his evaluation. Ultimately he needs to minimize his alcohol use  / abstain to reduce his risk for cirrhosis. I offered him referral for counseling and assistance with this, he declined at this time. He states he understands risks of long-term alcohol abuse and will work on quitting on his own.  C[place]ville Cellar, MD [facility]

## 2018-11-30 NOTE — Patient Instructions (Signed)
If you are age 66 or older, your body mass index should be between 23-30. Your Body mass index is 21.13 kg/m. If this is out of the aforementioned range listed, please consider follow up with your Primary Care Provider.  If you are age 75 or younger, your body mass index should be between 19-25. Your Body mass index is 21.13 kg/m. If this is out of the aformentioned range listed, please consider follow up with your Primary Care Provider.   To help prevent the possible spread of infection to our patients, communities, and staff; we will be implementing the following measures:  Please only allow one visitor/family member to accompany you to any upcoming appointments with [facility]. If you have any concerns about this please contact our office to discuss prior to the appointment.   You have been scheduled for a colonoscopy. Please follow written instructions given to you at your visit today.  Please pick up your prep supplies at the pharmacy within the next 1-3 days. If you use inhalers (even only as needed), please bring them with you on the day of your procedure. Your physician has requested that you go to www.startemmi.com and enter the access code given to you at your visit today. This web site gives a general overview about your procedure. However, you should still follow specific instructions given to you by our office regarding your preparation for the procedure.  Please go to the lab in the basement of our building to have lab work done as you leave today. Hit "B" for basement when you get on the elevator.  When the doors open the lab is on your left.  We will call you with the results. Thank you.  Thank you for entrusting me with your care and for choosing [facility], Dr. Evergreen Cellar

## 2018-12-04 LAB — HEPATITIS A ANTIBODY, TOTAL: Hepatitis A AB,Total: NONREACTIVE

## 2018-12-04 LAB — ANA: Anti Nuclear Antibody (ANA): NEGATIVE

## 2018-12-04 LAB — HEPATITIS B SURFACE ANTIBODY,QUALITATIVE: Hep B S Ab: NONREACTIVE

## 2018-12-04 LAB — HCV RNA,QUANTITATIVE REAL TIME PCR
HCV Quantitative Log: 6.33 Log IU/mL — ABNORMAL HIGH
HCV RNA, PCR, QN: 2120000 [IU]/mL — AB

## 2018-12-04 LAB — HEPATITIS B SURFACE ANTIGEN: Hepatitis B Surface Ag: NONREACTIVE

## 2018-12-04 LAB — HEPATITIS C ANTIBODY
HEP C AB: REACTIVE — AB
SIGNAL TO CUT-OFF: 21.6 — ABNORMAL HIGH (ref <1.00)

## 2018-12-04 LAB — IGG: IGG (IMMUNOGLOBIN G), SERUM: 1879 mg/dL — AB (ref 600–1540)

## 2018-12-04 LAB — ANTI-SMOOTH MUSCLE ANTIBODY, IGG: Actin (Smooth Muscle) Antibody (IGG): <20 U (ref <20)

## 2018-12-06 ENCOUNTER — Other Ambulatory Visit: Payer: Self-pay

## 2018-12-06 DIAGNOSIS — B1920 Unspecified viral hepatitis C without hepatic coma: Secondary | ICD-10-CM

## 2018-12-08 ENCOUNTER — Other Ambulatory Visit: Payer: Self-pay

## 2018-12-08 DIAGNOSIS — B1920 Unspecified viral hepatitis C without hepatic coma: Secondary | ICD-10-CM

## 2018-12-08 DIAGNOSIS — R7989 Other specified abnormal findings of blood chemistry: Secondary | ICD-10-CM

## 2018-12-08 DIAGNOSIS — R945 Abnormal results of liver function studies: Principal | ICD-10-CM

## 2018-12-09 ENCOUNTER — Telehealth: Payer: Self-pay | Admitting: Gastroenterology

## 2018-12-09 NOTE — Telephone Encounter (Signed)
Results discussed with pt and friend [place] per his request, see result note.

## 2018-12-09 NOTE — Telephone Encounter (Signed)
Pt calling regarding lab results, pls call him.

## 2018-12-13 ENCOUNTER — Other Ambulatory Visit (INDEPENDENT_AMBULATORY_CARE_PROVIDER_SITE_OTHER): Payer: Medicare HMO

## 2018-12-13 DIAGNOSIS — R7989 Other specified abnormal findings of blood chemistry: Secondary | ICD-10-CM

## 2018-12-13 DIAGNOSIS — R945 Abnormal results of liver function studies: Secondary | ICD-10-CM | POA: Diagnosis not present

## 2018-12-13 DIAGNOSIS — B1920 Unspecified viral hepatitis C without hepatic coma: Secondary | ICD-10-CM

## 2018-12-13 LAB — HEPATIC FUNCTION PANEL
ALBUMIN: 3.7 g/dL (ref 3.5–5.2)
ALT: 56 U/L — AB (ref 0–53)
AST: 45 U/L — AB (ref 0–37)
Alkaline Phosphatase: 45 U/L (ref 39–117)
Bilirubin, Direct: 0.3 mg/dL (ref 0.0–0.3)
TOTAL PROTEIN: 7.3 g/dL (ref 6.0–8.3)
Total Bilirubin: 0.8 mg/dL (ref 0.2–1.2)

## 2018-12-17 LAB — HEPATITIS C GENOTYPE

## 2018-12-17 LAB — HEMOCHROMATOSIS DNA-PCR(C282Y,H63D)

## 2018-12-20 NOTE — Progress Notes (Signed)
Patient notified of neg. DNA results and Lft's being stable. Let patient know we would be calling him in 4-6 wks. To schedule [place] (if COVID-19 restrictions are lifted)

## 2018-12-28 ENCOUNTER — Telehealth: Payer: Self-pay

## 2018-12-28 NOTE — Telephone Encounter (Signed)
Called and spoke with sister about having to cancel his procedure for 4/30. She said she would tell him and that she understood. I did assure her that we would be contacting him to reschedule his procedure.

## 2019-01-06 ENCOUNTER — Telehealth: Payer: Self-pay

## 2019-01-06 DIAGNOSIS — R945 Abnormal results of liver function studies: Principal | ICD-10-CM

## 2019-01-06 DIAGNOSIS — B1920 Unspecified viral hepatitis C without hepatic coma: Secondary | ICD-10-CM

## 2019-01-06 DIAGNOSIS — F1010 Alcohol abuse, uncomplicated: Secondary | ICD-10-CM

## 2019-01-06 DIAGNOSIS — R7989 Other specified abnormal findings of blood chemistry: Secondary | ICD-10-CM

## 2019-01-06 DIAGNOSIS — K760 Fatty (change of) liver, not elsewhere classified: Secondary | ICD-10-CM

## 2019-01-06 NOTE — Telephone Encounter (Signed)
-----   Message from Algernon Huxley, RN sent at 12/09/2018 10:26 AM EDT ----- Regarding: Liver US Pt needs [place] of liver, rule out cirrhosis Delayed due to covid-19

## 2019-01-06 NOTE — Telephone Encounter (Signed)
the patient has been scheduled for an abdominal ultrasound at [facility] on Wednesday, 02-16-2019 at 9:30am. To arrive at 9:15am, NPO after midnight.  Letter has been mailed to the patient.

## 2019-01-13 ENCOUNTER — Encounter: Payer: Self-pay | Admitting: Gastroenterology

## 2019-01-17 ENCOUNTER — Encounter: Payer: Medicare HMO | Admitting: Infectious Diseases

## 2019-01-17 ENCOUNTER — Telehealth: Payer: Self-pay | Admitting: Pharmacy Technician

## 2019-01-17 ENCOUNTER — Other Ambulatory Visit: Payer: Self-pay

## 2019-01-17 DIAGNOSIS — K760 Fatty (change of) liver, not elsewhere classified: Secondary | ICD-10-CM

## 2019-01-17 NOTE — Telephone Encounter (Signed)
[facility] Patient Teacher, [name] completed.    The patient is insured through [facility].  A prior authorization for a Hepatitis C medication must be performed before we can determine the copay.  We will continue to follow to see if copay assistance is needed.  [name]. [name] Patient [facility] Phone: [phone] Fax:  [phone]

## 2019-01-25 ENCOUNTER — Encounter: Payer: Self-pay | Admitting: *Deleted

## 2019-02-08 ENCOUNTER — Telehealth: Payer: Self-pay

## 2019-02-08 ENCOUNTER — Encounter: Payer: Medicare HMO | Admitting: Infectious Diseases

## 2019-02-08 NOTE — Telephone Encounter (Signed)
-----   Message from Roetta Sessions, [place] sent at 01/06/2019  8:26 AM EDT ----- Regarding: U/S at [facility] June 3 Pt has been scheduled for U/S at [facility] on Wed, 02-16-19 at  9:30am.  Letter mailed in April due to Covid-19.  Call to remind pt about appt.

## 2019-02-08 NOTE — Telephone Encounter (Signed)
Called and reminded pt about U/S on 6-3 at [facility]. Arrive at 9:15am, NPO after midnight.

## 2019-02-14 HISTORY — PX: COLONOSCOPY: SHX174

## 2019-02-16 ENCOUNTER — Ambulatory Visit (HOSPITAL_COMMUNITY)
Admission: RE | Admit: 2019-02-16 | Discharge: 2019-02-16 | Disposition: A | Discharge status: Home / Self Care | Payer: Medicare HMO | Source: Ambulatory Visit | Attending: Gastroenterology | Admitting: Gastroenterology

## 2019-02-16 ENCOUNTER — Other Ambulatory Visit: Payer: Self-pay

## 2019-02-16 DIAGNOSIS — K760 Fatty (change of) liver, not elsewhere classified: Secondary | ICD-10-CM | POA: Diagnosis present

## 2019-02-16 DIAGNOSIS — F1010 Alcohol abuse, uncomplicated: Secondary | ICD-10-CM

## 2019-02-16 DIAGNOSIS — B1920 Unspecified viral hepatitis C without hepatic coma: Secondary | ICD-10-CM

## 2019-02-16 DIAGNOSIS — R945 Abnormal results of liver function studies: Secondary | ICD-10-CM | POA: Diagnosis present

## 2019-02-16 DIAGNOSIS — R7989 Other specified abnormal findings of blood chemistry: Secondary | ICD-10-CM

## 2019-02-21 ENCOUNTER — Telehealth: Payer: Self-pay

## 2019-02-21 NOTE — Telephone Encounter (Signed)
Left message with sister to please have the patient call us back

## 2019-02-21 NOTE — Telephone Encounter (Signed)
-----   Message from Yetta Flock, MD sent at 02/20/2019  6:46 PM EDT ----- Sherlynn Stalls can you please relay the findings of the ultrasound to the patient: - there are changes concerning for early cirrhosis of the liver. I would like a follow up clinic appointment to talk with him about this in the next 1-2 months. He was already referred to ID for hepatitis C therapy, I don't see any clinic visit, can you check with him if he is doing this. Thanks

## 2019-03-04 ENCOUNTER — Telehealth: Payer: Self-pay | Admitting: Gastroenterology

## 2019-03-04 NOTE — Telephone Encounter (Signed)

## 2019-03-07 ENCOUNTER — Encounter: Payer: Self-pay | Admitting: Gastroenterology

## 2019-03-07 ENCOUNTER — Ambulatory Visit (AMBULATORY_SURGERY_CENTER): Payer: Medicare HMO | Admitting: Gastroenterology

## 2019-03-07 ENCOUNTER — Other Ambulatory Visit: Payer: Self-pay

## 2019-03-07 VITALS — BP 150/99 | HR 71 | Temp 99.3°F | Resp 12 | Ht 68.0 in | Wt 139.0 lb

## 2019-03-07 DIAGNOSIS — K529 Noninfective gastroenteritis and colitis, unspecified: Secondary | ICD-10-CM | POA: Diagnosis not present

## 2019-03-07 DIAGNOSIS — D122 Benign neoplasm of ascending colon: Secondary | ICD-10-CM

## 2019-03-07 DIAGNOSIS — Z800 Family history of malignant neoplasm of digestive organs: Secondary | ICD-10-CM

## 2019-03-07 DIAGNOSIS — K648 Other hemorrhoids: Secondary | ICD-10-CM | POA: Diagnosis not present

## 2019-03-07 DIAGNOSIS — K5730 Diverticulosis of large intestine without perforation or abscess without bleeding: Secondary | ICD-10-CM | POA: Diagnosis not present

## 2019-03-07 MED ORDER — SODIUM CHLORIDE 0.9 % IV SOLN: 500.0000 mL | Freq: Once | INTRAVENOUS | Status: DC

## 2019-03-07 NOTE — Progress Notes (Signed)
Called to room to assist during endoscopic procedure.  Patient ID and intended procedure confirmed with present staff. Received instructions for my participation in the procedure from the performing physician.  

## 2019-03-07 NOTE — Progress Notes (Signed)
To PACU, VSS. Report to [email or web address] 

## 2019-03-07 NOTE — Op Note (Signed)
[name]: [name] Procedure Date: 03/07/2019 10:34 AM MRN: [id number] Endoscopist: Remo Lipps P. Havery Moros , MD Age: 68 Referring MD:  Date of Birth: [birth year] Gender: Male Account #: [id number] Procedure:                Colonoscopy Indications:              Screening patient at increased risk: Family history                            of 1st-degree relative with colorectal cancer                            (brother diagnosed age 48s), first time exam, also                            endorses chronic intermittent loose stools Medicines:                Monitored Anesthesia Care Procedure:                Pre-Anesthesia Assessment:                           - Prior to the procedure, a History and Physical                            was performed, and patient medications and                            allergies were reviewed. The patient's tolerance of                            previous anesthesia was also reviewed. The risks                            and benefits of the procedure and the sedation                            options and risks were discussed with the patient.                            All questions were answered, and informed consent                            was obtained. Prior Anticoagulants: The patient has                            taken no previous anticoagulant or antiplatelet                            agents. ASA Grade Assessment: III - A patient with                            severe systemic disease. After reviewing the risks  and benefits, the patient was deemed in                            satisfactory condition to undergo the procedure.                           After obtaining informed consent, the colonoscope                            was passed under direct vision. Throughout the                            procedure, the patient's blood pressure, pulse, and                            oxygen  saturations were monitored continuously. The                            Colonoscope was introduced through the anus and                            advanced to the the terminal ileum, with                            identification of the appendiceal orifice and IC                            valve. The colonoscopy was performed without                            difficulty. The patient tolerated the procedure                            well. The quality of the bowel preparation was                            good. The terminal ileum, ileocecal valve,                            appendiceal orifice, and rectum were photographed. Scope In: 10:44:34 AM Scope Out: 11:02:20 AM Scope Withdrawal Time: 0 hours 15 minutes 46 seconds  Total Procedure Duration: 0 hours 17 minutes 46 seconds  Findings:                 The perianal and digital rectal examinations were                            normal.                           The terminal ileum appeared normal.                           A 4 mm polyp was found in the ascending colon. The  polyp was sessile. The polyp was removed with a                            cold snare. Resection and retrieval were complete.                           Scattered medium-mouthed diverticula were found in                            the entire colon.                           Internal hemorrhoids were found during                            retroflexion. The hemorrhoids were small.                           The exam was otherwise without abnormality. No                            inflammatory changes noted.                           Biopsies for histology were taken with a cold                            forceps from the right colon and left colon for                            evaluation of microscopic colitis. Complications:            No immediate complications. Estimated blood loss:                            Minimal. Estimated Blood Loss:      Estimated blood loss was minimal. Impression:               - The examined portion of the ileum was normal.                           - One 4 mm polyp in the ascending colon, removed                            with a cold snare. Resected and retrieved.                           - Diverticulosis in the entire examined colon.                           - Internal hemorrhoids.                           - The examination was otherwise normal.                           -  Biopsies were taken with a cold forceps from the                            right colon and left colon for evaluation of                            microscopic colitis. Recommendation:           - Patient has a contact number available for                            emergencies. The signs and symptoms of potential                            delayed complications were discussed with the                            patient. Return to normal activities tomorrow.                            Written discharge instructions were provided to the                            patient.                           - Resume previous diet.                           - Continue present medications.                           - Await pathology results.                           - Follow up with infectious disease as previously                            discussed for history of hepatitis C Steven P. Armbruster, MD 03/07/2019 11:07:30 AM This report has been signed electronically.

## 2019-03-07 NOTE — Patient Instructions (Signed)
Try to increase the fiber in your diet, and try to drink drink plenty of water.  YOU HAD AN ENDOSCOPIC PROCEDURE TODAY AT [facility]:   Refer to the procedure report that was given to you for any specific questions about what was found during the examination.  If the procedure report does not answer your questions, please call your gastroenterologist to clarify.  If you requested that your care partner not be given the details of your procedure findings, then the procedure report has been included in a sealed envelope for you to review at your convenience later.  YOU SHOULD EXPECT: Some feelings of bloating in the abdomen. Passage of more gas than usual.  Walking can help get rid of the air that was put into your GI tract during the procedure and reduce the bloating. If you had a lower endoscopy (such as a colonoscopy or flexible sigmoidoscopy) you may notice spotting of blood in your stool or on the toilet paper. If you underwent a bowel prep for your procedure, you may not have a normal bowel movement for a few days.  Please Note:  You might notice some irritation and congestion in your nose or some drainage.  This is from the oxygen used during your procedure.  There is no need for concern and it should clear up in a day or so.  SYMPTOMS TO REPORT IMMEDIATELY:   Following lower endoscopy (colonoscopy or flexible sigmoidoscopy):  Excessive amounts of blood in the stool  Significant tenderness or worsening of abdominal pains  Swelling of the abdomen that is new, acute  Fever of 100F or higher  For urgent or emergent issues, a gastroenterologist can be reached at any hour by calling [phone].   DIET:  We do recommend a small meal at first, but then you may proceed to your regular diet.  Drink plenty of fluids but you should avoid alcoholic beverages for 24 hours. Try to increase the fiber I your diet, and drink plenty of water. ACTIVITY:  You should plan to take it easy  for the rest of today and you should NOT DRIVE or use heavy machinery until tomorrow (because of the sedation medicines used during the test).    FOLLOW UP: Our staff will call the number listed on your records 48-72 hours following your procedure to check on you and address any questions or concerns that you may have regarding the information given to you following your procedure. If we do not reach you, we will leave a message.  We will attempt to reach you two times.  During this call, we will ask if you have developed any symptoms of COVID 19. If you develop any symptoms (ie: fever, flu-like symptoms, shortness of breath, cough etc.) before then, please call [phone].  If you test positive for Covid 19 in the 2 weeks post procedure, please call and report this information to [place].    If any biopsies were taken you will be contacted by phone or by letter within the next 1-3 weeks.  Please call us at [phone] if you have not heard about the biopsies in 3 weeks.    SIGNATURES/CONFIDENTIALITY: You and/or your care partner have signed paperwork which will be entered into your electronic medical record.  These signatures attest to the fact that that the information above on your After Visit Summary has been reviewed and is understood.  Full responsibility of the confidentiality of this discharge information lies with you and/or your  care-partner.

## 2019-03-07 NOTE — Progress Notes (Signed)
Pt's states no medical or surgical changes since previsit or office visit. 

## 2019-03-07 NOTE — Progress Notes (Signed)
[name]- temp [name]- vitals 

## 2019-03-08 ENCOUNTER — Ambulatory Visit (INDEPENDENT_AMBULATORY_CARE_PROVIDER_SITE_OTHER): Payer: Medicare HMO | Admitting: Infectious Diseases

## 2019-03-08 ENCOUNTER — Encounter: Payer: Self-pay | Admitting: Infectious Diseases

## 2019-03-08 VITALS — BP 138/83 | HR 89 | Temp 98.7°F | Wt 144.0 lb

## 2019-03-08 DIAGNOSIS — F1010 Alcohol abuse, uncomplicated: Secondary | ICD-10-CM | POA: Diagnosis not present

## 2019-03-08 DIAGNOSIS — B182 Chronic viral hepatitis C: Secondary | ICD-10-CM | POA: Diagnosis not present

## 2019-03-08 DIAGNOSIS — F1410 Cocaine abuse, uncomplicated: Secondary | ICD-10-CM | POA: Diagnosis not present

## 2019-03-08 NOTE — Telephone Encounter (Signed)
[facility] Patient Advocate Encounter  Insurance verification completed.    The patient is insured through [employer].  We will continue to follow to see if copay assistance is needed.  Leata Dominy E. Birtie Fellman, CPhT Specialty Pharmacy Patient [facility] Phone: [phone] Fax:  [phone]   

## 2019-03-08 NOTE — Progress Notes (Signed)
[name]  [id number]  19-Apr-1951    HPI: The patient is a 68 y.o. y/o AA male who presents today for an evaluation for HCV. He was diagnosed in 11/2018 by [facility] when he presented for an evaluation for elevated LFTs in the setting of alcohol abuse and a brother who was recently diagnosed with colon CA. He admits to both IV and inhaled cocaine use in the past.  He states that he has not used injection drugs in over 20 years.  He does not recall sharing needles when he participated in this habit.  He denies any tattoos, body piercings, or blood transfusions in the past.  He last used inhaled cocaine approximately 3 to 4 weeks ago.  Work-up of his hepatitis C was confirmatory of chronic infection with genotype 1a infection and viremia of 2,120,000 IU.  He also recently had a colonoscopy performed yesterday which showed no abnormalities.  The patient admits to drinking approximately three 40 ounce cans of beer every day, I habit that he is recently cut down to.  He is without complaints today but is interested in starting hepatitis C treatment as soon as possible.  He is unable to confirm whether he takes Prilosec daily and for what reason.  He did have a right upper quadrant ultrasound performed earlier this month that was concerning for early cirrhosis.  Past Medical History:  Diagnosis Date   Diabetes mellitus without complication ([place])    ETOH abuse    Frequent urination    Glaucoma    Hepatitis C virus infection without hepatic coma    dx'ed in 11/2018    Past Surgical History:  Procedure Laterality Date   ANKLE SURGERY  2011   right ankle   KNEE SURGERY       Family History  Problem Relation Age of Onset   Cancer Sister    Colon cancer Brother 18       ????   Cancer Brother      Social History   Tobacco Use   Smoking status: Current Every Day Smoker    Packs/day: 0.30    Types: Cigarettes   Smokeless tobacco: Never Used  Substance Use Topics    Alcohol use: Yes    Comment: mostly on weekends   Drug use: Yes    Comment: uses crack, last used about 1 month ago      has no history on file for sexual activity. Admits to using condoms with sexual partners. He is disabled secondary to fall from roof and fracturing his RT ankle.  No Known Allergies   Outpatient Medications Prior to Visit  Medication Sig Dispense Refill   ibuprofen (ADVIL,MOTRIN) 600 MG tablet Take 1 tablet (600 mg total) by mouth every 6 (six) hours as needed. 30 tablet 0   latanoprost (XALATAN) 0.005 % ophthalmic solution Place 1 drop into the right eye at bedtime.     omeprazole (PRILOSEC) 20 MG capsule Take 1 capsule (20 mg total) by mouth daily. 30 capsule 0   prednisoLONE acetate (PRED FORTE) 1 % ophthalmic suspension Place 1 drop into the right eye 4 (four) times daily.      No facility-administered medications prior to visit.      Review of Systems  Constitutional: Negative for chills, fatigue and fever.  HENT: Negative for congestion, hearing loss, rhinorrhea and sinus pressure.   Eyes: Negative for photophobia, pain, redness and visual disturbance.  Respiratory: Negative for apnea, cough, shortness of breath and wheezing.  Cardiovascular: Negative for chest pain and palpitations.  Gastrointestinal: Negative for abdominal pain, constipation, diarrhea, nausea and vomiting.  Endocrine: Negative for cold intolerance, heat intolerance, polydipsia and polyuria.  Genitourinary: Negative for decreased urine volume, dysuria, frequency, hematuria and testicular pain.  Musculoskeletal: Negative for back pain, myalgias and neck pain.  Skin: Negative for pallor and rash.  Allergic/Immunologic: Negative for immunocompromised state.  Neurological: Negative for dizziness, seizures, syncope, speech difficulty and light-headedness.  Hematological: Does not bruise/bleed easily.  Psychiatric/Behavioral: Negative for agitation and hallucinations. The patient is not  nervous/anxious.      Vitals:   03/08/19 1024  BP: 138/83  Pulse: 89  Temp: 98.7 F (37.1 C)     Physical Exam Gen: pleasant, NAD, A&Ox 3 Head: NCAT, mild bitemporal wasting evident EENT: PERRL, EOMI, MMM, adequate dentition, no icterus Neck: supple, no JVD CV: NRRR, no murmurs evident Pulm: CTA bilaterally, no wheeze or retractions Abd: soft, NTND, +BS Extrems: no LE edema, 2+ pulses Skin: no rashes, poor skin turgor Neuro: CN II-XII grossly intact, no focal neurologic deficits appreciated, gait was slowed and wide stanced, A&Ox 3   Labs: Lab Results  Component Value Date   HEPBSAG NON-REACTIVE 11/30/2018    Lab Results  Component Value Date   HCVRNAPCRQN 2,120,000 (H) 11/30/2018    No results found for: [facility]  Lab Results  Component Value Date   HCVGENOTYPE 1a 12/13/2018    Lab Results  Component Value Date   WBC 3.5 (L) 06/19/2018   HGB 15.6 06/19/2018   HCT 45.3 06/19/2018   MCV 95.8 06/19/2018   PLT 169 06/19/2018       Chemistry      Component Value Date/Time   NA 146 (H) 06/19/2018 1704   K 3.6 06/19/2018 1704   CL 111 06/19/2018 1704   CO2 23 06/19/2018 1704   BUN 8 06/19/2018 1704   CREATININE 0.52 (L) 06/19/2018 1704      Component Value Date/Time   CALCIUM 8.9 06/19/2018 1704   ALKPHOS 45 12/13/2018 1047   AST 45 (H) 12/13/2018 1047   ALT 56 (H) 12/13/2018 1047   BILITOT 0.8 12/13/2018 1047        Assessment/Plan: The patient is a 68 year old African-American male with chronic alcohol abuse and questionable GERD presenting for an evaluation for chronic hepatitis C.  1. Chronic HCV - HCV Ab was positive in 11/2018, thus confirming past exposure to pathogen. Note: this screening test will remain positive/reactive life-long even if HCV infection has been cured/immunologically cleared. Most likely risk factor for acquisition was past IV drug use or intranasal cocaine use as he denies tattoo placement and blood transfusion. HCV viral  load was confirmed at 2.12 million IU with HCV genotype 1a. Check an HIV Ab to exclude co-infection as well. As chronic infection is confirmed, will need to proceed with FibroScan (especially in view of recent U/S findings) to determine stage of fibrosis and best directly active antiviral treatment options. F/u in 4 weeks to review results. Likely will treat with harvoni. Have asked pharmacy staff to determine prescriber or prilosec and what need for this medication is as he would have a higher chance for cure if this could be held.  2.  Alcohol and substance abuse- I would prefer that the patient decrease his alcohol intake considerably to increase his success with DAA treatment.  I have recommended the patient decrease his alcohol consumption in half by the time of his next visit and that he regularly attend alcoholics  anonymous meetings.  It would be preferable if the patient also had secured a sponsor for AA by the time of his next visit as well.  I discouraged the patient using intranasal cocaine to avoid reinfection or re-exposure for HCV.  Health maintenance -  I have counselled the patient extensively re: the need for barrier precautions with sexual activity in order to prevent unwanted transmission of HCV. He must continue these practices until 3 months after treatment completion until a sustained virologic response (SVR) has been confirmed to establish a cure of his infection. He expressed full understanding of these instructions. Vaccination for hepatitis A & B was recommended as was abstinence from cigarettes, alcohol, and illicit drugs.

## 2019-03-08 NOTE — Patient Instructions (Signed)
Reduce alcohol intake by half. Attend AA meetings regularly and secure a sponsor. Return to clinic in 4 weeks after FibroScan has been performed. Stop smoking cigarettes.

## 2019-03-09 ENCOUNTER — Telehealth: Payer: Self-pay | Admitting: *Deleted

## 2019-03-09 NOTE — Telephone Encounter (Signed)
Patient states that he is feeling fine and not having any problems since yesterdays procedure.

## 2019-03-09 NOTE — Telephone Encounter (Signed)
No answer and unable to leave message for post procedure follow up will attempt to call back. SM

## 2019-03-21 ENCOUNTER — Emergency Department (HOSPITAL_COMMUNITY)
Admission: EM | Admit: 2019-03-21 | Discharge: 2019-03-21 | Disposition: A | Discharge status: Home / Self Care | Payer: Medicare HMO | Attending: Emergency Medicine | Admitting: Emergency Medicine

## 2019-03-21 ENCOUNTER — Other Ambulatory Visit: Payer: Self-pay

## 2019-03-21 ENCOUNTER — Encounter (HOSPITAL_COMMUNITY): Payer: Self-pay | Admitting: Emergency Medicine

## 2019-03-21 DIAGNOSIS — Z79899 Other long term (current) drug therapy: Secondary | ICD-10-CM | POA: Insufficient documentation

## 2019-03-21 DIAGNOSIS — E119 Type 2 diabetes mellitus without complications: Secondary | ICD-10-CM | POA: Insufficient documentation

## 2019-03-21 DIAGNOSIS — R070 Pain in throat: Secondary | ICD-10-CM | POA: Insufficient documentation

## 2019-03-21 DIAGNOSIS — F17210 Nicotine dependence, cigarettes, uncomplicated: Secondary | ICD-10-CM | POA: Insufficient documentation

## 2019-03-21 NOTE — Discharge Instructions (Signed)
Please follow-up with the ear nose and throat (ENT) specialist as soon as possible on this matter.  Call the number provided to set up an appointment.  Return to the emergency department for associated fever, significantly increased pain, drooling, difficulty swallowing, throat swelling, or any other major concerns.

## 2019-03-21 NOTE — ED Triage Notes (Signed)
Pt arrives to ED from home with complaints of ongoing sore throat for the last couple of months. Pt states the pain starts in the right side of his throat and radiates to his right ear. Pt states he sometimes spits up blood and other times he spits up white mucous.

## 2019-03-21 NOTE — ED Notes (Signed)
Patient verbalizes understanding of discharge instructions. Opportunity for questioning and answers were provided. Armband removed by staff, pt discharged from ED.  

## 2019-03-21 NOTE — ED Provider Notes (Signed)
[facility] EMERGENCY DEPARTMENT Provider Note   CSN: [id number] Arrival date & time: 03/21/19  1324    History   Chief Complaint Chief Complaint  Patient presents with  . Sore Throat    HPI [name] is a 69 y.o. male.     HPI   [name] is a 68 y.o. male, with a history of DM, EtOH abuse, hepatitis C, presenting to the ED with right-sided throat pain for the last month or two.  Pain is sharp, moderate, radiating toward the right ear, constant.   Denies difficulty swallowing or breathing, fever/chills, nausea/vomiting, drooling, ear drainage, hearing deficit, dizziness, hematemesis, hemoptysis, or any other complaints.       Past Medical History:  Diagnosis Date  . Diabetes mellitus without complication ([place])   . ETOH abuse   . Frequent urination   . Glaucoma   . Hepatitis C virus infection without hepatic coma    dx'ed in 11/2018    There are no active problems to display for this patient.   Past Surgical History:  Procedure Laterality Date  . ANKLE SURGERY  2011   right ankle  . KNEE SURGERY          Home Medications    Prior to Admission medications   Medication Sig Start Date End Date Taking? Authorizing Provider  ibuprofen (ADVIL,MOTRIN) 600 MG tablet Take 1 tablet (600 mg total) by mouth every 6 (six) hours as needed. 05/31/18   Virgel Manifold, MD  latanoprost (XALATAN) 0.005 % ophthalmic solution Place 1 drop into the right eye at bedtime.    [provider]  omeprazole (PRILOSEC) 20 MG capsule Take 1 capsule (20 mg total) by mouth daily. 08/23/17   Horton, Barbette Hair, MD  prednisoLONE acetate (PRED FORTE) 1 % ophthalmic suspension Place 1 drop into the right eye 4 (four) times daily.     [provider]    Family History Family History  Problem Relation Age of Onset  . Cancer Sister   . Colon cancer Brother 80       ????  . Cancer Brother     Social History Social History   Tobacco Use  .  Smoking status: Current Every Day Smoker    Packs/day: 0.30    Types: Cigarettes  . Smokeless tobacco: Never Used  Substance Use Topics  . Alcohol use: Yes    Comment: mostly on weekends  . Drug use: Yes    Comment: uses crack, last used about 1 month ago     Allergies   Patient has no known allergies.   Review of Systems Review of Systems  Constitutional: Negative for fever.  HENT: Positive for sore throat. Negative for drooling, trouble swallowing and voice change.   Respiratory: Negative for cough and shortness of breath.   Cardiovascular: Negative for chest pain.  Gastrointestinal: Negative for nausea and vomiting.  Musculoskeletal: Positive for neck pain.  Neurological: Negative for dizziness, weakness, light-headedness and headaches.  All other systems reviewed and are negative.    Physical Exam Updated Vital Signs BP 105/71 (BP Location: Right Arm)   Pulse 92   Temp 99.1 F (37.3 C) (Oral)   Resp 17   SpO2 96%   Physical Exam Vitals signs and nursing note reviewed.  Constitutional:      General: He is not in acute distress.    Appearance: He is well-developed. He is not diaphoretic.  HENT:     Head: Normocephalic and atraumatic.  Right Ear: Tympanic membrane, ear canal and external ear normal.     Left Ear: Tympanic membrane, ear canal and external ear normal.     Mouth/Throat:     Mouth: Mucous membranes are moist.     Pharynx: Oropharynx is clear. Uvula midline. No pharyngeal swelling, oropharyngeal exudate, posterior oropharyngeal erythema or uvula swelling.     Comments: Dentition appears to be intact and stable.  No noted area of swelling or fluctuance.  No trismus or noted abnormal phonation.  Mouth opening to at least 3 finger widths.  Handles oral secretions without difficulty.  No noted facial swelling.  No sublingual swelling.  No swelling or tenderness to the submental or submandibular regions.  No swelling or tenderness into the soft tissues of  the neck. Eyes:     Conjunctiva/sclera: Conjunctivae normal.  Neck:     Musculoskeletal: Normal range of motion and neck supple.      Comments: Patient has pain and some tenderness in the region indicated.  No noted swelling, color change, fluctuance, fullness, or other abnormality. Cardiovascular:     Rate and Rhythm: Normal rate and regular rhythm.     Pulses: Normal pulses.          Radial pulses are 2+ on the right side and 2+ on the left side.       Posterior tibial pulses are 2+ on the right side and 2+ on the left side.     Comments: Tactile temperature in the extremities appropriate and equal bilaterally. Pulmonary:     Effort: Pulmonary effort is normal. No respiratory distress.  Abdominal:     Palpations: Abdomen is soft.     Tenderness: There is no abdominal tenderness. There is no guarding.  Musculoskeletal:     Right lower leg: No edema.     Left lower leg: No edema.  Lymphadenopathy:     Cervical: No cervical adenopathy.  Skin:    General: Skin is warm and dry.  Neurological:     Mental Status: He is alert.  Psychiatric:        Mood and Affect: Mood and affect normal.        Speech: Speech normal.        Behavior: Behavior normal.      ED Treatments / Results  Labs (all labs ordered are listed, but only abnormal results are displayed) Labs Reviewed - No data to display  EKG None  Radiology No results found.  Procedures Procedures (including critical care time)  Medications Ordered in ED Medications - No data to display   Initial Impression / Assessment and Plan / ED Course  I have reviewed the triage vital signs and the nursing notes.  Pertinent labs & imaging results that were available during my care of the patient were reviewed by me and considered in my medical decision making (see chart for details).        Patient presents with pain to the right side of the neck for the last month or two. Patient is nontoxic appearing, afebrile, not  tachycardic, not tachypneic, not hypotensive, excellent SPO2 on room air, and is in no apparent distress.  There is no indication of PTA or strep pharyngitis on exam.  RPA considered, but ultimately thought less likely due to duration of symptoms and the fact that patient has not gotten any sicker.  Mononucleosis was also considered, however, patient's pain is unilateral and he has no other symptoms. For these reasons, we thought the best course  of action would be for patient to follow-up with ENT.  Strict return precautions discussed. Patient voices understanding of these instructions, accepts the plan, and is comfortable with discharge.  Findings and plan of care discussed with Charlesetta Shanks, MD.   Final Clinical Impressions(s) / ED Diagnoses   Final diagnoses:  Throat pain    ED Discharge Orders    None       Layla Maw 03/21/19 1539    Charlesetta Shanks, MD 03/22/19 1125

## 2019-03-22 ENCOUNTER — Ambulatory Visit
Admission: RE | Admit: 2019-03-22 | Discharge: 2019-03-22 | Disposition: A | Discharge status: Home / Self Care | Payer: Medicare HMO | Source: Ambulatory Visit | Attending: Infectious Diseases | Admitting: Infectious Diseases

## 2019-03-22 DIAGNOSIS — F1010 Alcohol abuse, uncomplicated: Secondary | ICD-10-CM

## 2019-03-22 DIAGNOSIS — B182 Chronic viral hepatitis C: Secondary | ICD-10-CM

## 2019-03-23 ENCOUNTER — Telehealth: Payer: Self-pay

## 2019-03-23 NOTE — Progress Notes (Signed)
prescreened pt for 7-10 appt.  He would like to have a telephone visit instead of [facility]ximity.

## 2019-03-23 NOTE — Telephone Encounter (Signed)
Called and spoke to pt regarding televisit on 7-9

## 2019-03-24 ENCOUNTER — Other Ambulatory Visit: Payer: Self-pay

## 2019-03-24 ENCOUNTER — Encounter: Payer: Self-pay | Admitting: Gastroenterology

## 2019-03-24 ENCOUNTER — Ambulatory Visit (INDEPENDENT_AMBULATORY_CARE_PROVIDER_SITE_OTHER): Payer: Medicare HMO | Admitting: Gastroenterology

## 2019-03-24 DIAGNOSIS — R1310 Dysphagia, unspecified: Secondary | ICD-10-CM

## 2019-03-24 DIAGNOSIS — F1010 Alcohol abuse, uncomplicated: Secondary | ICD-10-CM

## 2019-03-24 DIAGNOSIS — B182 Chronic viral hepatitis C: Secondary | ICD-10-CM | POA: Diagnosis not present

## 2019-03-24 DIAGNOSIS — Z860100 Personal history of colon polyps, unspecified: Secondary | ICD-10-CM

## 2019-03-24 DIAGNOSIS — J029 Acute pharyngitis, unspecified: Secondary | ICD-10-CM | POA: Diagnosis not present

## 2019-03-24 NOTE — Patient Instructions (Addendum)
If you are age 66 or older, your body mass index should be between 23-30. Your There is no height or weight on file to calculate BMI. If this is out of the aforementioned range listed, please consider follow up with your Primary Care Provider.  If you are age 82 or younger, your body mass index should be between 19-25. Your There is no height or weight on file to calculate BMI. If this is out of the aformentioned range listed, please consider follow up with your Primary Care Provider.   To help prevent the possible spread of infection to our patients, communities, and staff; we will be implementing the following measures:  As of now we are not allowing any visitors/family members to accompany you to any upcoming appointments with [facility]. If you have any concerns about this please contact our office to discuss prior to the appointment.   You need to be vaccinated against Hepatitis A and Hepatitis B. We have scheduled your first nurse visit for Wednesday, 7-22 at 11:00am.  Your second appt will be Monday, August 24th at 10:00am.  Your third will be in 5 months from your second injection.  We will refer you to [facility].  They will contact you to schedule an appointment. Please let us know if you have not heard from them with in a week.  Thank you for entrusting me with your care and for choosing [facility], Dr. Flordell Hills Cellar

## 2019-03-24 NOTE — Progress Notes (Signed)
THIS ENCOUNTER IS A VIRTUAL VISIT DUE TO COVID-19 - PATIENT WAS NOT SEEN IN THE OFFICE. PATIENT HAS CONSENTED TO VIRTUAL VISIT / TELEMEDICINE VISIT USING PHONE ONLY   Location of patient: home Location of provider: office Persons participating: myself, patient Time spent: 22 minutes  HPI :  68 y/o male here for reassessment.  I saw him in March for a new patient visit. He was referred for colon cancer screening in light of his brothers diagnosis of colon cancer (diagnosed age 45s). He underwent a colonoscopy in June showing one small adenoma. He is due for his next surveillance exam in 7 years based on this finding. He tolerated it well.  Otherwise he was noted to have elevated liver enzymes in the setting of significant alcohol use, 2 x 40 oz beers daily longstanding, as well as tobacco use. Imaging initially with US showed fatty liver with suggestion of cirrhosis. I did a serologic workup and he was found to be positive for genotype 1a hepatitis C. He was referred to ID. He had an elastography done yesterday showing F0-F1 fibrosis, minimal risk for cirrhosis which is good news. He is due to follow up with ID at the end of the month to start therapy. He continues to drink a beer daily, we discussed that at length.   Finally, his main complaint is persistent sore throat now for a few months. He states ongoing sore throat which radiates to his ear, and he is spitting blood out of his mouth periodically. He has some pain and difficulty swallowing in his throat, but once it goes down his esophagus he does not have trouble. He was seen in the ED for that issue a few days ago and told to see ENT however he is unaware of any consult that has been coordinated. No fevers. He feels this has slowly progressed over time. He is not taking his prilosec. Eating soft / liquids to minimize symptoms right now. No abdominal pains.    [place] 02/16/19 - irregular liver, possible early cirrhosis, otherwise normal [place] with  Elastography 03/22/19 - fibrosis score F0-F1, mild hepatic steatosis,   Ferritin 581, iron 262, sat 64.8%, hemochromatosis genetic testing negative  Colonoscopy 03/07/19 - The terminal ileum appeared normal. 24mm adenoma, diverticulosis, hemorrhoids, normal colon otherwise.     Past Medical History:  Diagnosis Date  . Diabetes mellitus without complication ([place])   . ETOH abuse   . Frequent urination   . Glaucoma   . Hepatitis C virus infection without hepatic coma    dx'ed in 11/2018     Past Surgical History:  Procedure Laterality Date  . ANKLE SURGERY  2011   right ankle  . KNEE SURGERY     Family History  Problem Relation Age of Onset  . Breast cancer Sister   . Colon cancer Brother 42       ????  . Cancer Brother    Social History   Tobacco Use  . Smoking status: Current Some Day Smoker    Packs/day: 0.30    Types: Cigarettes  . Smokeless tobacco: Never Used  Substance Use Topics  . Alcohol use: Yes    Comment: mostly on weekends  . Drug use: Yes    Comment: uses crack, last used about 1 month ago   Current Outpatient Medications  Medication Sig Dispense Refill  . ibuprofen (ADVIL,MOTRIN) 600 MG tablet Take 1 tablet (600 mg total) by mouth every 6 (six) hours as needed. 30 tablet 0  .  latanoprost (XALATAN) 0.005 % ophthalmic solution Place 1 drop into the right eye at bedtime.    Marland Kitchen omeprazole (PRILOSEC) 20 MG capsule Take 1 capsule (20 mg total) by mouth daily. (Patient not taking: Reported on 03/23/2019) 30 capsule 0  . prednisoLONE acetate (PRED FORTE) 1 % ophthalmic suspension Place 1 drop into the right eye 4 (four) times daily.      No current facility-administered medications for this visit.    No Known Allergies   Review of Systems: All systems reviewed and negative except where noted in HPI.    US Abdomen Complete W/elastography  Result Date: 03/22/2019 CLINICAL DATA:  Chronic hepatitis-C without hepatic coma. Alcohol abuse. EXAM: ULTRASOUND ABDOMEN  ULTRASOUND HEPATIC ELASTOGRAPHY TECHNIQUE: Sonography of the upper abdomen was performed. In addition, ultrasound elastography evaluation of the liver was performed. A region of interest was placed within the right lobe of the liver. Following application of a compressive sonographic pulse, shear waves were detected in the adjacent hepatic tissue and the shear wave velocity was calculated. Multiple assessments were performed at the selected site. Median shear wave velocity is correlated to a Metavir fibrosis score. COMPARISON:  02/16/2019 FINDINGS: ULTRASOUND ABDOMEN Gallbladder: No gallstones or wall thickening visualized. No sonographic Murphy sign noted by sonographer. Common bile duct: Diameter: 3 mm, within normal limits. Liver: Mildly increased echogenicity of the hepatic parenchyma, consistent with hepatic steatosis. No hepatic mass identified. portal vein is patent on color Doppler imaging with normal direction of blood flow towards the liver. IVC: No abnormality visualized. Pancreas: Not visualized due to overlying bowel gas. Spleen: Size and appearance within normal limits. Right Kidney: Length: 11.0 cm. Echogenicity within normal limits. No mass or hydronephrosis visualized. Left Kidney: Length: 11.0 cm. Echogenicity within normal limits. No mass or hydronephrosis visualized. Abdominal aorta: No aneurysm visualized. Other findings: None. ULTRASOUND HEPATIC ELASTOGRAPHY Device: Siemens Helix VTQ Patient position: Oblique Transducer 6C1 Number of measurements: 10 Hepatic segment:  8 Median velocity:   1.14 m/sec IQR: 0.28 IQR/Median velocity ratio: 0.25 Corresponding Metavir fibrosis score:  F0/F1 Risk of fibrosis: Minimal Limitations of exam: Patient inability to perform breath holding Please note that abnormal shear wave velocities may also be identified in clinical settings other than with hepatic fibrosis, such as: acute hepatitis, elevated right heart and central venous pressures including use of beta  blockers, veno-occlusive disease (Budd-Chiari), infiltrative processes such as mastocytosis/amyloidosis/infiltrative tumor, extrahepatic cholestasis, in the post-prandial state, and liver transplantation. Correlation with patient history, laboratory data, and clinical condition recommended. IMPRESSION: ULTRASOUND ABDOMEN: Mild hepatic steatosis.  Otherwise negative abdomen ultrasound. ULTRASOUND HEPATIC ELASTOGRAPHY: Median hepatic shear wave velocity is calculated at 1.14 m/sec. Corresponding Metavir fibrosis score is F0/F1. Risk of fibrosis is Minimal. Follow-up: None required Electronically Signed   By: Marlaine Hind M.D.   On: 03/22/2019 12:35    Lab Results  Component Value Date   WBC 3.5 (L) 06/19/2018   HGB 15.6 06/19/2018   HCT 45.3 06/19/2018   MCV 95.8 06/19/2018   PLT 169 06/19/2018    Lab Results  Component Value Date   CREATININE 0.52 (L) 06/19/2018   BUN 8 06/19/2018   NA 146 (H) 06/19/2018   K 3.6 06/19/2018   CL 111 06/19/2018   CO2 23 06/19/2018    Lab Results  Component Value Date   ALT 56 (H) 12/13/2018   AST 45 (H) 12/13/2018   ALKPHOS 45 12/13/2018   BILITOT 0.8 12/13/2018      Physical Exam: There were no vitals taken  for this visit.  ASSESSMENT AND PLAN: 68 y/o male here for reassessment of the following:  Sore throat / Dysphagia - patient with a few months of sore throat, spitting up blood from his mouth, and dysphagia in his throat. He was seen in the ED recently and told he needed to see ENT which I agree with. Unclear if that consult has been placed, but will go ahead and have our office try to coordinate as soon as possible. He warrants a laryngoscopy based on symptoms reported today, seems less likely esophageal however no pathology on laryngoscopy he will need EGD and can do that quickly for him if needed. Otherwise encouraged him to resume PPI as he recently stopped it. He agreed.  Chronic hepatitis C / alcohol abuse - recently found to have  genotype 1a hepatitis C when evaluated for elevated liver enzymes. He has seen ID. Recent US with elastography shows F0-F1 fibrotic change which is excellent news, looks like risk of cirrhosis is low. He is due to see them at the end of the month to discuss starting therapy. I otherwise counseled him on his alcohol use, he should abstain and quit completely at this time, and would recommend he do so moving forward, he is lucky he doesn't have cirrhosis in light of alcohol and chronic HCV. Otherwise, he is not immune to hep A and B and needs to be vaccinated for these. Our office will coordinate.  History of colon polyps - one adenoma noted on recent colonoscopy, due for surveillance in 7 years as brother had colon cancer diagnosed later in life.   Defiance Cellar, MD [facility]   22 minutes

## 2019-03-29 ENCOUNTER — Other Ambulatory Visit: Payer: Self-pay | Admitting: Otolaryngology

## 2019-03-29 NOTE — H&P (Signed)
Otolaryngology Clinic Note  HPI:    [name] is a 68 y.o. male patient of Privacy PCP Provider for evaluation of throat symptoms.  He has been bringing up some blood, having pain with talking and swallowing including right referred ear pain ongoing 2+ months.  He thinks he may have lost some weight.  He reports a relatively modest cigarette smoking and alcohol history which I think he is probably downplaying.  He was given some viscous Xylocaine for the discomfort which is helping minimally. PMH/Meds/All/SocHx/FamHx/ROS:   Past Medical History      Past Medical History:  Diagnosis Date  . Glaucoma       Past Surgical History       Past Surgical History:  Procedure Laterality Date  . COLONOSCOPY        No family history of bleeding disorders, wound healing problems or difficulty with anesthesia.   Social History  Social History        Socioeconomic History  . Marital status: Unknown    Spouse name: Not on file  . Number of children: Not on file  . Years of education: Not on file  . Highest education level: Not on file  Occupational History  . Not on file  Social Needs  . Financial resource strain: Not on file  . Food insecurity    Worry: Not on file    Inability: Not on file  . Transportation needs    Medical: Not on file    Non-medical: Not on file  Tobacco Use  . Smoking status: Current Every Day Smoker  . Smokeless tobacco: Never Used  Substance and Sexual Activity  . Alcohol use: Yes    Frequency: Monthly or less    Drinks per session: 1 or 2    Binge frequency: Less than monthly  . Drug use: Not on file  . Sexual activity: Not on file  Lifestyle  . Physical activity    Days per week: Not on file    Minutes per session: Not on file  . Stress: Not on file  Relationships  . Social Herbalist on phone: Not on file    Gets together: Not on file    Attends religious service: Not on file    Active member  of club or organization: Not on file    Attends meetings of clubs or organizations: Not on file    Relationship status: Not on file  Other Topics Concern  . Not on file  Social History Narrative  . Not on file       Current Outpatient Medications:  .  latanoprost (XALATAN) 0.005 % ophthalmic solution, Apply 1 drop to eye., Disp: , Rfl:  .  LIDOCAINE 2 % solution,   , Disp: , Rfl:   A complete ROS was performed with pertinent positives/negatives noted in the HPI. The remainder of the ROS are negative.    Physical Exam:    Ht 1.727 m (5\' 8" )   Wt 63.5 kg (140 lb)   BMI 21.29 kg/m  He is thin.  Mental status seems appropriate.  Voice is basically clear and he is not stridorous or dyspneic.  He has a strong anaerobic odor to his breath.  The head is atraumatic and neck supple.  Cranial nerves grossly intact.  Ear canals are clear with normal drums.  He has corneal opacification on the right side consistent with his reported blindness.  Ear canals are clear with normal  aerated drums.  Anterior nose is moist and patent.  Oral cavity is edentulous with a full upper plate.  Oropharynx is clear.  Hypopharynx/larynx by mirror examination shows a 2-3 cm bulky necrotic mass in the posterior supraglottic larynx.  Airway looks fair.  Neck exam without palpable adenopathy.  Using the flexible laryngoscope, the nasopharynx is clear.  Oropharynx is clear.  Hypopharynx/larynx shows a 2-3 cm ovoid apparently pedunculated mass probably coming off the right aryepiglottic fold and impinging on the supraglottic airway.  The vocal cords appear to be mobile.  Flexible Laryngoscopy   Indications were discussed.  Details of the procedure were explained.  Questions were answered and informed consent was obtained verbally.    Technique:  After anesthetizing the nasal cavity with topical lidocaine and oxymetazoline, the flexible endoscope was introduced and passed through the right nasal cavity into  the nasopharynx. The scope was withdrawn from the nose. He tolerated the procedure well.    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  Plan: I think we need to take him to the operating room urgently to do panendoscopy and biopsy and possible excision of the tumor mass for airway.  If this is not possible, we will do a tracheostomy.  He will need a CT scan of the neck and chest.  If this is cancer, a PET scan.  He should be seen by tumor board.   Lilyan Gilford, MD  4/82/5003

## 2019-03-30 ENCOUNTER — Other Ambulatory Visit: Payer: Self-pay | Admitting: Otolaryngology

## 2019-03-30 ENCOUNTER — Other Ambulatory Visit: Payer: Self-pay

## 2019-03-30 ENCOUNTER — Other Ambulatory Visit (HOSPITAL_COMMUNITY)
Admission: RE | Admit: 2019-03-30 | Discharge: 2019-03-30 | Disposition: A | Discharge status: Home / Self Care | Payer: Medicare HMO | Source: Ambulatory Visit | Attending: Otolaryngology | Admitting: Otolaryngology

## 2019-03-30 ENCOUNTER — Encounter (HOSPITAL_COMMUNITY): Payer: Self-pay | Admitting: *Deleted

## 2019-03-30 DIAGNOSIS — C329 Malignant neoplasm of larynx, unspecified: Secondary | ICD-10-CM

## 2019-03-30 DIAGNOSIS — Z1159 Encounter for screening for other viral diseases: Secondary | ICD-10-CM | POA: Diagnosis present

## 2019-03-30 DIAGNOSIS — R070 Pain in throat: Secondary | ICD-10-CM

## 2019-03-30 LAB — SARS CORONAVIRUS 2 (TAT 6-24 HRS): SARS Coronavirus 2: NEGATIVE

## 2019-03-30 NOTE — Progress Notes (Signed)
Patient informed of the [place] that is currently in effect.  Patient verbalized understanding.  Patient denies shortness of breath, fever, cough and chest pain.  PCP - [facility], Dr.Edwin Avbuere Cardiologist -Denies Inf Disease - Dr Theodis Blaze - Dr Hackett Cellar  Chest x-ray - Denies EKG - 06/19/18 Stress Test - Denies ECHO - Denies Cardiac Cath - Denies  Anesthesia review: Yes.  [place],PA  STOP now taking any Aspirin (unless otherwise instructed by your surgeon), Aleve, Naproxen, Ibuprofen, Motrin, Advil, Goody's, BC's, all herbal medications, fish oil, and all vitamins.  Coronavirus Screening Have you or Friend Veronica experienced the following symptoms:  Cough yes/no: No Fever (>100.57F)  yes/no: No Runny nose yes/no: No Sore throat yes/no: No Difficulty breathing/shortness of breath  yes/no: No  Have you or Verdene Lennert traveled in the last 14 days and where? yes/no: No

## 2019-03-30 NOTE — Progress Notes (Signed)
Anesthesia Chart Review: SAME DAY WORK-UP   Case: [id number] Date/Time: 04/01/19 0715   Procedures:      DIRECT LARYNGOSCOPY with biopsy, possible laser (N/A )     BRONCHOSCOPY (N/A )     ESOPHAGOSCOPY (N/A )     Possible TRACHEOSTOMY (N/A )   Anesthesia type: General   Pre-op diagnosis: Supraglottic Cancer   Location: [place] [facility] ROOM 08 / [place] [facility]   Surgeon: Jodi Marble, MD      DISCUSSION: Patient is a 68 year old male scheduled for the above procedure.  History includes smoking, alcohol abuse, cocaine use, hepatitis C (diagnosed 11/2018, followed by ID, but untreated as of 03/2019), glaucoma, DM2 (no meds). He recently underwent GI evaluation for family history of colon cancer and personal history of elevated LFTs in the setting of significant alcohol use, intranasal cocaine use and diagnosed with chronic hepatitis C (11/2018). He was referred to ENT by ED and GI this month for several month history of sore throat, dysphagia, spitting up blood. 03/28/19 flexible laryngoscopy by Dr. 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  Plan: I think we need to take him to the operating room urgently to do panendoscopy and biopsy and possible excision of the tumor mass for airway. If this is not possible, we will do a tracheostomy. He will need a CT scan of the neck and chest. If this is cancer, a PET scan. He should be seen by tumor board."  The CT scan of the neck and chest has been ordered, but is still pending as of 03/30/19.  Presurgical COVID-19 test was 03/30/2019, result in process.   He is for labs on arrival the day of surgery. Anesthesia team to evaluate day of surgery. Will defer to surgeon decision for preoperative UDS (he reported last crack cocaine use ~ 1 months ago).   VS: Ht 5\' 8"  (1.727 m)   Wt 63.5 kg   BMI 21.29 kg/m    PROVIDERS: [name], MD is PCP - Sour John Cellar, MD is GI. Last visit 03/24/19. Possible early cirrhosis on  [place], fibrosis score F0-F1 per elastography. 03/07/19 colonoscopy showed a 4 mm adenoma, diverticulosis and hemorrhoids, otherwise normal.  Rayvon Char, MD is ID. Last visit 03/08/19. See for chronic hepatitis C, likely related to his IV/intranasal cocaine use. He will likely be started on Harvoni treatment in the future, but preference for patient to refrain from cocaine use, decrease ETOH consumption, and get involved in AA first.    LABS: He is for updated labs on the day of surgery. As of 12/13/18, AST 45 (down from 93 06/19/18 with peak 140 08/22/17), ALT 56 (down from 84 06/19/17 with peak 119 08/22/17). On 06/19/18, H/H and platelet count were WNL.   IMAGES: US abdomen with elastography 03/22/19: IMPRESSION: ULTRASOUND ABDOMEN: Mild hepatic steatosis.  Otherwise negative abdomen ultrasound. ULTRASOUND HEPATIC ELASTOGRAPHY: Median hepatic shear wave velocity is calculated at 1.14 m/sec. Corresponding Metavir fibrosis score is F0/F1. Risk of fibrosis is Minimal. Follow-up: None required   EKG: 06/19/18: Normal sinus rhythm Low voltage QRS Inferior infarct , age undetermined Abnormal ECG No acute changes No significant change since last tracing [01/03/18, 08/22/17] Confirmed by Varney Biles [id number]) on 06/19/2018 5:36:18 PM   CV: N/A   Past Medical History:  Diagnosis Date  . Diabetes mellitus without complication (HCC)    no meds  . ETOH abuse   . Frequent urination   . Glaucoma   . Hepatitis C virus infection  without hepatic coma    dx'ed in 11/2018  . Wears partial dentures    uppers    Past Surgical History:  Procedure Laterality Date  . ANKLE SURGERY  2011   right ankle  . COLONOSCOPY  02/2019   polyps - Dr Havery Moros  . EYE SURGERY Right   . KNEE SURGERY      MEDICATIONS: No current facility-administered medications for this encounter.    Marland Kitchen acetaminophen (TYLENOL) 500 MG tablet  . aspirin EC 325 MG tablet  . atropine 1 % ophthalmic solution  . cetirizine  (ZYRTEC) 10 MG tablet  . ibuprofen (ADVIL) 200 MG tablet  . latanoprost (XALATAN) 0.005 % ophthalmic solution  . lidocaine (XYLOCAINE) 2 % solution  . prednisoLONE acetate (PRED FORTE) 1 % ophthalmic suspension  . tolterodine (DETROL LA) 4 MG 24 hr capsule  Per PAT RN note, advised to hold ASA unless otherwise instructed by surgeon.   Myra Gianotti, PA-C Surgical Short Stay/Anesthesiology [facility] Phone [phone] [facility] Phone [phone] 03/30/2019 4:28 PM

## 2019-03-30 NOTE — Anesthesia Preprocedure Evaluation (Addendum)
Anesthesia Evaluation  Patient identified by MRN, date of birth, ID band Patient awake  General Assessment Comment:Alcoholic, cocaine addict, who is on no anti-hypertensives  Reviewed: Allergy & Precautions, NPO status , Patient's Chart, lab work & pertinent test results  Airway Mallampati: III  TM Distance: >3 FB Neck ROM: Full    Dental no notable dental hx.    Pulmonary neg pulmonary ROS, Current Smoker,    Pulmonary exam normal breath sounds clear to auscultation       Cardiovascular hypertension, Normal cardiovascular exam Rhythm:Regular Rate:Normal     Neuro/Psych negative neurological ROS  negative psych ROS   GI/Hepatic negative GI ROS, (+)     substance abuse  alcohol use and cocaine use, Hepatitis -, CAlcoholic Chronic cocaine use   Endo/Other  diabetes  Renal/GU negative Renal ROS  negative genitourinary   Musculoskeletal negative musculoskeletal ROS (+)   Abdominal   Peds negative pediatric ROS (+)  Hematology negative hematology ROS (+)   Anesthesia Other Findings   Reproductive/Obstetrics negative OB ROS                           Anesthesia Physical Anesthesia Plan  ASA: IV  Anesthesia Plan: General   Post-op Pain Management:    Induction: Intravenous  PONV Risk Score and Plan: 1 and Ondansetron and Treatment may vary due to age or medical condition  Airway Management Planned: Oral ETT and Video Laryngoscope Planned  Additional Equipment:   Intra-op Plan:   Post-operative Plan: Extubation in OR  Informed Consent: I have reviewed the patients History and Physical, chart, labs and discussed the procedure including the risks, benefits and alternatives for the proposed anesthesia with the patient or authorized representative who has indicated his/her understanding and acceptance.     Dental advisory given  Plan Discussed with: CRNA and Surgeon  Anesthesia  Plan Comments: (PAT note written by Myra Gianotti, PA-C. )       Anesthesia Quick Evaluation

## 2019-04-01 ENCOUNTER — Ambulatory Visit (HOSPITAL_COMMUNITY)
Admission: RE | Admit: 2019-04-01 | Discharge: 2019-04-01 | Disposition: A | Discharge status: Home / Self Care | Payer: Medicare HMO | Attending: Otolaryngology | Admitting: Otolaryngology

## 2019-04-01 ENCOUNTER — Encounter (HOSPITAL_COMMUNITY)
Admission: RE | Disposition: A | Discharge status: Home / Self Care | Payer: Self-pay | Source: Home / Self Care | Attending: Otolaryngology

## 2019-04-01 ENCOUNTER — Encounter (HOSPITAL_COMMUNITY): Payer: Self-pay

## 2019-04-01 ENCOUNTER — Inpatient Hospital Stay (HOSPITAL_COMMUNITY): Payer: Medicare HMO | Admitting: Vascular Surgery

## 2019-04-01 ENCOUNTER — Other Ambulatory Visit: Payer: Self-pay

## 2019-04-01 DIAGNOSIS — H409 Unspecified glaucoma: Secondary | ICD-10-CM | POA: Diagnosis not present

## 2019-04-01 DIAGNOSIS — B182 Chronic viral hepatitis C: Secondary | ICD-10-CM | POA: Insufficient documentation

## 2019-04-01 DIAGNOSIS — I10 Essential (primary) hypertension: Secondary | ICD-10-CM | POA: Insufficient documentation

## 2019-04-01 DIAGNOSIS — Z7982 Long term (current) use of aspirin: Secondary | ICD-10-CM | POA: Diagnosis not present

## 2019-04-01 DIAGNOSIS — Z800 Family history of malignant neoplasm of digestive organs: Secondary | ICD-10-CM | POA: Diagnosis not present

## 2019-04-01 DIAGNOSIS — C321 Malignant neoplasm of supraglottis: Secondary | ICD-10-CM | POA: Diagnosis present

## 2019-04-01 DIAGNOSIS — E119 Type 2 diabetes mellitus without complications: Secondary | ICD-10-CM | POA: Diagnosis not present

## 2019-04-01 DIAGNOSIS — Z79899 Other long term (current) drug therapy: Secondary | ICD-10-CM | POA: Insufficient documentation

## 2019-04-01 DIAGNOSIS — C4442 Squamous cell carcinoma of skin of scalp and neck: Secondary | ICD-10-CM | POA: Insufficient documentation

## 2019-04-01 DIAGNOSIS — F17210 Nicotine dependence, cigarettes, uncomplicated: Secondary | ICD-10-CM | POA: Insufficient documentation

## 2019-04-01 HISTORY — PX: LARYNGOSCOPY AND BRONCHOSCOPY: SHX5659

## 2019-04-01 HISTORY — PX: ESOPHAGOSCOPY: SHX5534

## 2019-04-01 HISTORY — PX: DIRECT LARYNGOSCOPY: SHX5326

## 2019-04-01 LAB — GLUCOSE, CAPILLARY
Glucose-Capillary: 119 mg/dL — ABNORMAL HIGH (ref 70–99)
Glucose-Capillary: 166 mg/dL — ABNORMAL HIGH (ref 70–99)

## 2019-04-01 LAB — CBC
HCT: 45.1 % (ref 39.0–52.0)
Hemoglobin: 14.9 g/dL (ref 13.0–17.0)
MCH: 32.1 pg (ref 26.0–34.0)
MCHC: 33 g/dL (ref 30.0–36.0)
MCV: 97.2 fL (ref 80.0–100.0)
Platelets: 207 10*3/uL (ref 150–400)
RBC: 4.64 MIL/uL (ref 4.22–5.81)
RDW: 12.1 % (ref 11.5–15.5)
WBC: 4.7 10*3/uL (ref 4.0–10.5)
nRBC: 0 % (ref 0.0–0.2)

## 2019-04-01 LAB — COMPREHENSIVE METABOLIC PANEL
ALT: 36 U/L (ref 0–44)
AST: 38 U/L (ref 15–41)
Albumin: 3.4 g/dL — ABNORMAL LOW (ref 3.5–5.0)
Alkaline Phosphatase: 43 U/L (ref 38–126)
Anion gap: 8 (ref 5–15)
BUN: <5 mg/dL — ABNORMAL LOW (ref 8–23)
CO2: 25 mmol/L (ref 22–32)
Calcium: 9.2 mg/dL (ref 8.9–10.3)
Chloride: 104 mmol/L (ref 98–111)
Creatinine, Ser: 0.64 mg/dL (ref 0.61–1.24)
GFR calc Af Amer: >60 mL/min (ref >60)
GFR calc non Af Amer: >60 mL/min (ref >60)
Glucose, Bld: 130 mg/dL — ABNORMAL HIGH (ref 70–99)
Potassium: 3.9 mmol/L (ref 3.5–5.1)
Sodium: 137 mmol/L (ref 135–145)
Total Bilirubin: 1.1 mg/dL (ref 0.3–1.2)
Total Protein: 7.8 g/dL (ref 6.5–8.1)

## 2019-04-01 SURGERY — LARYNGOSCOPY, DIRECT
Anesthesia: General | Site: Throat

## 2019-04-01 MED ORDER — LIDOCAINE 2% (20 MG/ML) 5 ML SYRINGE
INTRAMUSCULAR | Status: DC | PRN
Start: 2019-04-01 — End: 2019-04-01
  Administered 2019-04-01: 100 mg via INTRAVENOUS

## 2019-04-01 MED ORDER — 0.9 % SODIUM CHLORIDE (POUR BTL) OPTIME
TOPICAL | Status: DC | PRN
  Administered 2019-04-01: 07:00:00 1000 mL

## 2019-04-01 MED ORDER — MIDAZOLAM HCL 2 MG/2ML IJ SOLN
INTRAMUSCULAR | Status: AC
  Filled 2019-04-01: qty 2

## 2019-04-01 MED ORDER — CHLORHEXIDINE GLUCONATE CLOTH 2 % EX PADS: 6.0000 | MEDICATED_PAD | Freq: Once | CUTANEOUS | Status: DC

## 2019-04-01 MED ORDER — STERILE WATER FOR IRRIGATION IR SOLN
Status: DC | PRN
  Administered 2019-04-01: 1000 mL

## 2019-04-01 MED ORDER — EPINEPHRINE HCL (NASAL) 0.1 % NA SOLN
NASAL | Status: AC
  Filled 2019-04-01: qty 30

## 2019-04-01 MED ORDER — SUGAMMADEX SODIUM 200 MG/2ML IV SOLN
INTRAVENOUS | Status: DC | PRN
Start: 2019-04-01 — End: 2019-04-01
  Administered 2019-04-01: 200 mg via INTRAVENOUS

## 2019-04-01 MED ORDER — SODIUM CHLORIDE 0.9 % IV SOLN
INTRAVENOUS | Status: DC | PRN
  Administered 2019-04-01: 08:00:00 15 ug/min via INTRAVENOUS

## 2019-04-01 MED ORDER — ONDANSETRON HCL 4 MG/2ML IJ SOLN
INTRAMUSCULAR | Status: DC | PRN
Start: 2019-04-01 — End: 2019-04-01
  Administered 2019-04-01: 4 mg via INTRAVENOUS

## 2019-04-01 MED ORDER — OXYCODONE HCL 5 MG PO TABS
5.0000 mg | ORAL_TABLET | Freq: Once | ORAL | Status: AC
  Administered 2019-04-01: 10:00:00 5 mg via ORAL

## 2019-04-01 MED ORDER — OXYCODONE HCL 5 MG PO TABS
ORAL_TABLET | ORAL | Status: AC
  Filled 2019-04-01: qty 1

## 2019-04-01 MED ORDER — EPHEDRINE 5 MG/ML INJ
INTRAVENOUS | Status: AC
  Filled 2019-04-01: qty 10

## 2019-04-01 MED ORDER — OXYMETAZOLINE HCL 0.05 % NA SOLN
NASAL | Status: DC | PRN
  Administered 2019-04-01: 1

## 2019-04-01 MED ORDER — TRIAMCINOLONE ACETONIDE 40 MG/ML IJ SUSP
INTRAMUSCULAR | Status: AC
  Filled 2019-04-01: qty 5

## 2019-04-01 MED ORDER — PROMETHAZINE HCL 25 MG/ML IJ SOLN: 6.2500 mg | INTRAMUSCULAR | Status: DC | PRN

## 2019-04-01 MED ORDER — FENTANYL CITRATE (PF) 250 MCG/5ML IJ SOLN
INTRAMUSCULAR | Status: AC
  Filled 2019-04-01: qty 5

## 2019-04-01 MED ORDER — FENTANYL CITRATE (PF) 250 MCG/5ML IJ SOLN
INTRAMUSCULAR | Status: DC | PRN
  Administered 2019-04-01: 75 ug via INTRAVENOUS

## 2019-04-01 MED ORDER — LABETALOL HCL 5 MG/ML IV SOLN
INTRAVENOUS | Status: DC | PRN
  Administered 2019-04-01: 10 mg via INTRAVENOUS
  Administered 2019-04-01: 5 mg via INTRAVENOUS

## 2019-04-01 MED ORDER — EPHEDRINE SULFATE 50 MG/ML IJ SOLN
INTRAMUSCULAR | Status: DC | PRN
  Administered 2019-04-01: 5 mg via INTRAVENOUS

## 2019-04-01 MED ORDER — PROPOFOL 10 MG/ML IV BOLUS
INTRAVENOUS | Status: DC | PRN
  Administered 2019-04-01: 110 mg via INTRAVENOUS

## 2019-04-01 MED ORDER — LACTATED RINGERS IV SOLN
INTRAVENOUS | Status: DC | PRN
  Administered 2019-04-01: 08:00:00 via INTRAVENOUS

## 2019-04-01 MED ORDER — DEXAMETHASONE SODIUM PHOSPHATE 10 MG/ML IJ SOLN
INTRAMUSCULAR | Status: DC | PRN
  Administered 2019-04-01: 10 mg via INTRAVENOUS

## 2019-04-01 MED ORDER — SUCCINYLCHOLINE CHLORIDE 20 MG/ML IJ SOLN
INTRAMUSCULAR | Status: DC | PRN
  Administered 2019-04-01: 100 mg via INTRAVENOUS

## 2019-04-01 MED ORDER — ROCURONIUM BROMIDE 10 MG/ML (PF) SYRINGE
PREFILLED_SYRINGE | INTRAVENOUS | Status: DC | PRN
  Administered 2019-04-01: 50 mg via INTRAVENOUS

## 2019-04-01 MED ORDER — OXYMETAZOLINE HCL 0.05 % NA SOLN
NASAL | Status: AC
  Filled 2019-04-01: qty 30

## 2019-04-01 MED ORDER — HYDROMORPHONE HCL 1 MG/ML IJ SOLN
0.2500 mg | INTRAMUSCULAR | Status: DC | PRN
Start: 2019-04-01 — End: 2019-04-01

## 2019-04-01 MED ORDER — DEXAMETHASONE SODIUM PHOSPHATE 10 MG/ML IJ SOLN
INTRAMUSCULAR | Status: AC
  Filled 2019-04-01: qty 1

## 2019-04-01 MED ORDER — LIDOCAINE-EPINEPHRINE 1 %-1:100000 IJ SOLN
INTRAMUSCULAR | Status: AC
  Filled 2019-04-01: qty 1

## 2019-04-01 MED ORDER — HYDRALAZINE HCL 20 MG/ML IJ SOLN
INTRAMUSCULAR | Status: DC | PRN
  Administered 2019-04-01 (×2): 10 mg via INTRAVENOUS

## 2019-04-01 MED ORDER — PHENYLEPHRINE 40 MCG/ML (10ML) SYRINGE FOR IV PUSH (FOR BLOOD PRESSURE SUPPORT)
PREFILLED_SYRINGE | INTRAVENOUS | Status: DC | PRN
  Administered 2019-04-01: 80 ug via INTRAVENOUS
  Administered 2019-04-01 (×2): 120 ug via INTRAVENOUS

## 2019-04-01 MED ORDER — PROPOFOL 10 MG/ML IV BOLUS
INTRAVENOUS | Status: AC
  Filled 2019-04-01: qty 20

## 2019-04-01 SURGICAL SUPPLY — 54 items
BALLN PULM 15 16.5 18 X 75CM (BALLOONS)
BALLN PULM 15 16.5 18X75 (BALLOONS)
BALLOON PULM 15 16.5 18X75 (BALLOONS) IMPLANT
BLADE SURG 15 STRL LF DISP TIS (BLADE) IMPLANT
BLADE SURG 15 STRL SS (BLADE) ×2
CANISTER SUCT 1200ML W/VALVE (MISCELLANEOUS) ×2 IMPLANT
CANISTER SUCT 3000ML PPV (MISCELLANEOUS) ×4 IMPLANT
CLEANER TIP ELECTROSURG 2X2 (MISCELLANEOUS) ×4 IMPLANT
CONT SPEC 4OZ CLIKSEAL STRL BL (MISCELLANEOUS) IMPLANT
COUNTER NEEDLE 20 DBL MAG RED (NEEDLE) ×2 IMPLANT
COVER BACK TABLE 60X90IN (DRAPES) ×4 IMPLANT
COVER MAYO STAND STRL (DRAPES) ×4 IMPLANT
COVER SURGICAL LIGHT HANDLE (MISCELLANEOUS) ×4 IMPLANT
COVER WAND RF STERILE (DRAPES) ×4 IMPLANT
DRAPE HALF SHEET 40X57 (DRAPES) ×4 IMPLANT
ELECT COATED BLADE 2.86 ST (ELECTRODE) ×4 IMPLANT
ELECT REM PT RETURN 9FT ADLT (ELECTROSURGICAL) ×4
ELECTRODE REM PT RTRN 9FT ADLT (ELECTROSURGICAL) ×2 IMPLANT
GAUZE 4X4 16PLY RFD (DISPOSABLE) ×4 IMPLANT
GAUZE SPONGE 4X4 12PLY STRL LF (GAUZE/BANDAGES/DRESSINGS) ×8 IMPLANT
GLOVE BIO SURGEON STRL SZ 6.5 (GLOVE) ×3 IMPLANT
GLOVE BIO SURGEONS STRL SZ 6.5 (GLOVE) ×3
GLOVE ECLIPSE 8.0 STRL XLNG CF (GLOVE) ×8 IMPLANT
GOWN STRL REUS W/ TWL LRG LVL3 (GOWN DISPOSABLE) ×2 IMPLANT
GOWN STRL REUS W/ TWL XL LVL3 (GOWN DISPOSABLE) ×2 IMPLANT
GOWN STRL REUS W/TWL LRG LVL3 (GOWN DISPOSABLE) ×4
GOWN STRL REUS W/TWL XL LVL3 (GOWN DISPOSABLE) ×4
GUARD TEETH (MISCELLANEOUS) ×4 IMPLANT
KIT BASIN OR (CUSTOM PROCEDURE TRAY) ×4 IMPLANT
KIT TURNOVER KIT B (KITS) ×4 IMPLANT
MARKER SKIN DUAL TIP RULER LAB (MISCELLANEOUS) IMPLANT
NDL HYPO 18GX1.5 BLUNT FILL (NEEDLE) IMPLANT
NDL HYPO 25GX1X1/2 BEV (NEEDLE) ×2 IMPLANT
NEEDLE HYPO 18GX1.5 BLUNT FILL (NEEDLE) IMPLANT
NEEDLE HYPO 25GX1X1/2 BEV (NEEDLE) ×4 IMPLANT
NS IRRIG 1000ML POUR BTL (IV SOLUTION) ×4 IMPLANT
PAD ARMBOARD 7.5X6 YLW CONV (MISCELLANEOUS) ×8 IMPLANT
PATTIES SURGICAL .5 X3 (DISPOSABLE) ×4 IMPLANT
PENCIL BUTTON HOLSTER BLD 10FT (ELECTRODE) ×4 IMPLANT
POSITIONER HEAD DONUT 9IN (MISCELLANEOUS) ×2 IMPLANT
SOLUTION ANTI FOG 6CC (MISCELLANEOUS) ×2 IMPLANT
SPONGE DRAIN TRACH 4X4 STRL 2S (GAUZE/BANDAGES/DRESSINGS) ×4 IMPLANT
SURGILUBE 2OZ TUBE FLIPTOP (MISCELLANEOUS) ×4 IMPLANT
SUT CHROMIC 2 0 SH (SUTURE) ×4 IMPLANT
SUT ETHILON 2 0 FS 18 (SUTURE) IMPLANT
SUT SILK 2 0 SH CR/8 (SUTURE) ×4 IMPLANT
SYR 5ML LL (SYRINGE) IMPLANT
SYR CONTROL 10ML LL (SYRINGE) ×2 IMPLANT
SYR INFLATE BILIARY GAUGE (MISCELLANEOUS) IMPLANT
TOWEL GREEN STERILE FF (TOWEL DISPOSABLE) ×6 IMPLANT
TRAY ENT MC OR (CUSTOM PROCEDURE TRAY) ×4 IMPLANT
TUBE CONNECTING 12'X1/4 (SUCTIONS) ×1
TUBE CONNECTING 12X1/4 (SUCTIONS) ×3 IMPLANT
WATER STERILE IRR 1000ML POUR (IV SOLUTION) ×4 IMPLANT

## 2019-04-01 NOTE — Anesthesia Procedure Notes (Signed)
Procedure Name: Intubation Date/Time: 04/01/2019 8:00 AM Performed by: Imagene Riches, CRNA Pre-anesthesia Checklist: Patient identified, Emergency Drugs available, Suction available and Patient being monitored Patient Re-evaluated:Patient Re-evaluated prior to induction Oxygen Delivery Method: Circle System Utilized Preoxygenation: Pre-oxygenation with 100% oxygen Induction Type: IV induction Ventilation: Two handed mask ventilation required Laryngoscope Size: Glidescope and 4 Grade View: Grade II Tube type: Oral Tube size: 6.0 mm Number of attempts: 1 Airway Equipment and Method: Stylet and Oral airway Placement Confirmation: ETT inserted through vocal cords under direct vision,  positive ETCO2 and breath sounds checked- equal and bilateral Secured at: 23 cm Tube secured with: Tape Dental Injury: Teeth and Oropharynx as per pre-operative assessment  Difficulty Due To: Difficulty was anticipated Comments: DL with Glide scope 4. Tumor originating from right side covered almost entire glottic opening. With significant head lift, able to visualize superior aspect of vocal cords. 6.0 MLT ETT easily passed through vocal cords. BBS and + ETCO2 confirmed. Difficult airway due to presence of supraglottic tumor.

## 2019-04-01 NOTE — Interval H&P Note (Signed)
History and Physical Interval Note:  04/01/2019 7:38 AM  [name]  has presented today for surgery, with the diagnosis of Supraglottic Cancer.  The various methods of treatment have been discussed with the patient and family. After consideration of risks, benefits and other options for treatment, the patient has consented to  Procedure(s): DIRECT LARYNGOSCOPY with biopsy, possible laser (N/A) BRONCHOSCOPY (N/A) ESOPHAGOSCOPY (N/A) Possible TRACHEOSTOMY (N/A) as a surgical intervention.  The patient's history has been re-reviewed, patient re-examined, no change in status, stable for surgery.  I have re-reviewed the patient's chart and labs.  Questions were answered to the patient's satisfaction.     Ileene Hutchinson [facility]

## 2019-04-01 NOTE — Transfer of Care (Signed)
Immediate Anesthesia Transfer of Care Note  Patient: Orville E Grimme  Procedure(s) Performed: DIRECT LARYNGOSCOPY WITH BIOPSY (N/A Throat) BRONCHOSCOPY (N/A Throat) ESOPHAGOSCOPY (N/A Throat)  Patient Location: PACU  Anesthesia Type:General  Level of Consciousness: drowsy  Airway & Oxygen Therapy: Patient Spontanous Breathing and Patient connected to face mask oxygen  Post-op Assessment: Report given to RN and Post -op Vital signs reviewed and stable  Post vital signs: Reviewed and stable  Last Vitals:  Vitals Value Taken Time  BP 110/77 04/01/19 0910  Temp    Pulse 85 04/01/19 0911  Resp 12 04/01/19 0911  SpO2 100 % 04/01/19 0911  Vitals shown include unvalidated device data.  Last Pain:  Vitals:   04/01/19 0555  TempSrc:   PainSc: 8       Patients Stated Pain Goal: 3 (53/66/44 0347)  Complications: No apparent anesthesia complications

## 2019-04-01 NOTE — Anesthesia Postprocedure Evaluation (Signed)
Anesthesia Post Note  [name]  Procedure(s) Performed: DIRECT LARYNGOSCOPY WITH BIOPSY (N/A Throat) BRONCHOSCOPY (N/A Throat) ESOPHAGOSCOPY (N/A Throat)     Patient location during evaluation: PACU Anesthesia Type: General Level of consciousness: awake and alert Pain management: pain level controlled Vital Signs Assessment: post-procedure vital signs reviewed and stable Respiratory status: spontaneous breathing, nonlabored ventilation, respiratory function stable and patient connected to nasal cannula oxygen Cardiovascular status: blood pressure returned to baseline and stable Postop Assessment: no apparent nausea or vomiting Anesthetic complications: no    Last Vitals:  Vitals:   04/01/19 0910 04/01/19 0955  BP: 110/77 113/68  Pulse: 82 80  Resp: 10 18  Temp: 36.7 C (!) 36.3 C  SpO2: 100% 96%    Last Pain:  Vitals:   04/01/19 0940  TempSrc:   PainSc: 9                  Jancy Sprankle S

## 2019-04-01 NOTE — Op Note (Signed)
04/01/2019  9:09 AM    Sharalyn Ink  [id number]   Pre-Op Dx:   right supraglottic carcinoma  Post-op Dx: T2 N0 M0 (stage II) right supraglottic carcinoma  Proc: Direct laryngoscopy with biopsy.  Cervical esophagoscopy.  Bronchoscopy.  Surg:  Tyson Alias MD  Anes:  GOT  EBL: Minimal  Comp: None  Findings: A bulky necrotic and semi-pedunculated tumor coming off the lateral surface of the right aryepiglottic fold and the anterior and lateral aspect of the piriform sinus on the right side.  Airway was compromised by the tumor mass at intubation.  Procedure: With the patient in a comfortable supine position, GOT anesthesia was induced without difficulty.  At an appropriate level, the table was turned 90 degrees away from Anesthesia.  A clean preparation and draping was performed in the standard fashion.  A surgical time out was obtained in the standard fashion.  Moist 4 x 4 was used to protect the upper gum.     Using the [facility] laryngoscope, the larynx was visualized.  The rod bronchoscope was passed by the ETT cuff and inspection down into the mainstem bronchus on each side was performed with the findings as described above.  The bronchoscope and laryngoscope were removed.  The anterior commissure laryngoscope was introduced taking care to protect lips, teeth, and endotracheal tube.  Complete laryngoscopy was performed in the standard fashion.  The findings were as described above.  The laryngoscope was removed.  The cervical esophagoscope was lubricated and inserted into the hypopharynx.  With gentle pressure, it was passed through the cricopharyngeus and advanced to its full length without difficulty with the findings as described above.  It was removed.  The oropharynx, oral cavity, nasopharynx and hypopharynx were palpated with findings as described above.  The neck was palpated on both sides with the findings as described above.  At this point a larger laser  laryngoscope was introduced and poised over the tumor mass and suspended.  Using straight and up cup forceps, large chunks of necrotic tumor were removed.  After debulking the tumor to clear the airway, the site of attachment was more clearly identified as above.  Afrin pledgets were used for hemostasis.  Endoscopic photographs were taken.  At this point the procedure was completed. The gum guard was removed.     The patient was returned to Anesthesia, awakened, extubated, and transferred to PACU in satisfactory condition.   Dispo:   PACU to home  Plan: Ice, elevation, analgesia.  Advance diet and activity as comfortable.  Await pathology report.  We will need a CT scan of the neck and a PET scan.  Will make a referral to radiation oncology.  Tyson Alias MD

## 2019-04-01 NOTE — Progress Notes (Signed)
Patient to be discharged home. When patient asked if there was someone to stay with him for the next 24 hrs patient paused and stated that his sister, Veatrice Bourbon, would be the person staying. Expressed to [place], his friend to pick up for discharge, the need for 24hr observation at home and that patient stated his sister would be the one.

## 2019-04-01 NOTE — Discharge Instructions (Addendum)
Ibuprofen and tylenol alternating for pain relief I will call with the Pathology report early next week Rinse your mouth and throat with cool dilute salt water to get rid of old blood and thick phlegm] Advance diet as comfortable Call for active bleeding, or breathing problems Recheck my office 2 weeks please, [phone]

## 2019-04-02 ENCOUNTER — Encounter (HOSPITAL_COMMUNITY): Payer: Self-pay | Admitting: Otolaryngology

## 2019-04-06 ENCOUNTER — Other Ambulatory Visit: Payer: Self-pay

## 2019-04-06 ENCOUNTER — Ambulatory Visit (INDEPENDENT_AMBULATORY_CARE_PROVIDER_SITE_OTHER): Payer: Medicare HMO | Admitting: Infectious Diseases

## 2019-04-06 ENCOUNTER — Encounter: Payer: Self-pay | Admitting: Infectious Diseases

## 2019-04-06 ENCOUNTER — Ambulatory Visit (INDEPENDENT_AMBULATORY_CARE_PROVIDER_SITE_OTHER): Payer: Medicare HMO | Admitting: Gastroenterology

## 2019-04-06 VITALS — BP 154/82 | HR 42 | Temp 98.0°F | Ht 68.0 in | Wt 133.0 lb

## 2019-04-06 DIAGNOSIS — F1010 Alcohol abuse, uncomplicated: Secondary | ICD-10-CM

## 2019-04-06 DIAGNOSIS — C4442 Squamous cell carcinoma of skin of scalp and neck: Secondary | ICD-10-CM | POA: Diagnosis not present

## 2019-04-06 DIAGNOSIS — Z23 Encounter for immunization: Secondary | ICD-10-CM

## 2019-04-06 DIAGNOSIS — B182 Chronic viral hepatitis C: Secondary | ICD-10-CM

## 2019-04-06 DIAGNOSIS — C760 Malignant neoplasm of head, face and neck: Secondary | ICD-10-CM | POA: Diagnosis not present

## 2019-04-06 NOTE — Patient Instructions (Signed)
Return to clinic in 6 weeks for return visit for Hep C. If your doctors plan to have you on chemotherapy, please have them give you a list of medication they will be starting you on.

## 2019-04-06 NOTE — Addendum Note (Signed)
Addended byDebbe Mounts on: 04/06/2019 10:36 AM   Modules accepted: Orders

## 2019-04-06 NOTE — Progress Notes (Signed)
[name]  [id number]  February 24, 1951    HPI: The patient is a 68 y.o. y/o AA male who presents today for a routine return visit for HCV. He was diagnosed in 11/2018 by [facility] when he presented for an evaluation for elevated LFTs in the setting of alcohol abuse and a brother who was recently diagnosed with colon CA. He admits to both IV and inhaled cocaine use in the past.  He states that he has not used injection drugs in over 20 years.  He does not recall sharing needles when he participated in this habit.  He denies any tattoos, body piercings, or blood transfusions in the past.  He last used inhaled cocaine approximately 3 to 4 weeks ago.  Work-up of his hepatitis C was confirmatory of chronic infection with genotype 1a infection and viremia of 2,120,000 IU.  He also recently had a colonoscopy performed in June which showed no abnormalities.  The patient admits to drinking approximately two 40 ounce cans of beer every day, a habit that he is recently cut down to.  He is without complaints today but is interested in starting hepatitis C treatment as soon as possible.  He is unable to confirm whether he takes Prilosec daily and for what reason.  He did have a right upper quadrant ultrasound performed earlier this year that was concerning for early cirrhosis, but his FibroScan showed only F0-F1 fibrosis. Last week, he had an excisional biopsy of a neck mass that confirmed a poorly differentiated squamous cell carcinoma. He is uncertain if next steps for his treatment involve chemotherapy or only XRT. He is able to swallow pills at this time without difficulty.  Past Medical History:  Diagnosis Date  . Diabetes mellitus without complication (HCC)    no meds  . ETOH abuse   . Frequent urination   . Glaucoma   . Hepatitis C virus infection without hepatic coma    dx'ed in 11/2018  . Wears partial dentures    uppers    Past Surgical History:  Procedure Laterality Date  . ANKLE SURGERY   2011   right ankle  . COLONOSCOPY  02/2019   polyps - Dr Havery Moros  . DIRECT LARYNGOSCOPY N/A 04/01/2019   Procedure: DIRECT LARYNGOSCOPY WITH BIOPSY;  Surgeon: Jodi Marble, MD;  Location: [facility];  Service: ENT;  Laterality: N/A;  . ESOPHAGOSCOPY N/A 04/01/2019   Procedure: ESOPHAGOSCOPY;  Surgeon: Jodi Marble, MD;  Location: [facility];  Service: ENT;  Laterality: N/A;  . EYE SURGERY Right   . KNEE SURGERY    . LARYNGOSCOPY AND BRONCHOSCOPY N/A 04/01/2019   Procedure: BRONCHOSCOPY;  Surgeon: Jodi Marble, MD;  Location: [facility] OR;  Service: ENT;  Laterality: N/A;     Family History  Problem Relation Age of Onset  . Breast cancer Sister   . Colon cancer Brother 100       ????  . Cancer Brother      Social History   Tobacco Use  . Smoking status: Current Some Day Smoker    Packs/day: 0.30    Years: 50.00    Pack years: 15.00    Types: Cigarettes  . Smokeless tobacco: Never Used  . Tobacco comment: "not since surgery on 7/17"  Substance Use Topics  . Alcohol use: Yes    Comment: "not since surgery on 7/17"  . Drug use: Yes    Types: Cocaine    Comment: "not since surgery on 7/17"  has no history on file for sexual activity. Admits to using condoms with sexual partners. He is disabled secondary to fall from roof and fracturing his RT ankle.  No Known Allergies   Outpatient Medications Prior to Visit  Medication Sig Dispense Refill  . acetaminophen (TYLENOL) 500 MG tablet Take 1,000 mg by mouth every 6 (six) hours as needed for moderate pain or headache.    Marland Kitchen aspirin EC 325 MG tablet Take 650 mg by mouth daily as needed for moderate pain.    Marland Kitchen atropine 1 % ophthalmic solution Place 1 drop into both eyes 2 (two) times a day.    . cetirizine (ZYRTEC) 10 MG tablet Take 10 mg by mouth daily as needed for allergies.    Marland Kitchen ibuprofen (ADVIL) 200 MG tablet Take 400 mg by mouth every 6 (six) hours as needed for headache or moderate pain.    Marland Kitchen latanoprost (XALATAN) 0.005 %  ophthalmic solution Place 1 drop into both eyes at bedtime.     . lidocaine (XYLOCAINE) 2 % solution Use as directed 3 mLs in the mouth or throat 3 (three) times daily as needed for mouth pain.     . prednisoLONE acetate (PRED FORTE) 1 % ophthalmic suspension Place 1 drop into the right eye 3 (three) times daily.     Marland Kitchen tolterodine (DETROL LA) 4 MG 24 hr capsule Take 4 mg by mouth daily.     No facility-administered medications prior to visit.      Review of Systems  Constitutional: Negative for chills, fatigue and fever.  HENT: Negative for congestion, hearing loss, rhinorrhea and sinus pressure.   Eyes: Negative for photophobia, pain, redness and visual disturbance.  Respiratory: Negative for apnea, cough, shortness of breath and wheezing.   Cardiovascular: Negative for chest pain and palpitations.  Gastrointestinal: Negative for abdominal pain, constipation, diarrhea, nausea and vomiting.  Endocrine: Negative for cold intolerance, heat intolerance, polydipsia and polyuria.  Genitourinary: Negative for decreased urine volume, dysuria, frequency, hematuria and testicular pain.  Musculoskeletal: Negative for back pain, myalgias and neck pain.  Skin: Negative for pallor and rash.  Allergic/Immunologic: Negative for immunocompromised state.  Neurological: Negative for dizziness, seizures, syncope, speech difficulty and light-headedness.  Hematological: Does not bruise/bleed easily.  Psychiatric/Behavioral: Negative for agitation and hallucinations. The patient is not nervous/anxious.      Vitals:   04/06/19 0909  BP: (!) 154/82  Pulse: (!) 42  Temp: 98 F (36.7 C)     Physical Exam Gen: pleasant, thin, NAD, A&Ox 3 Head: NCAT, mild bitemporal wasting evident EENT: PERRL, EOMI, MMM, adequate dentition, no icterus Neck: supple, no JVD CV: NRRR, no murmurs evident Pulm: CTA bilaterally, no wheeze or retractions Abd: soft, NTND, +BS Extrems: no LE edema, 2+ pulses Skin: no rashes,  poor skin turgor Neuro: CN II-XII grossly intact, no focal neurologic deficits appreciated, gait was slowed and wide stanced, A&Ox 3   Labs: Lab Results  Component Value Date   HEPBSAG NON-REACTIVE 11/30/2018    Lab Results  Component Value Date   HCVRNAPCRQN 2,120,000 (H) 11/30/2018    No results found for: [facility]  Lab Results  Component Value Date   HCVGENOTYPE 1a 12/13/2018    Lab Results  Component Value Date   WBC 4.7 04/01/2019   HGB 14.9 04/01/2019   HCT 45.1 04/01/2019   MCV 97.2 04/01/2019   PLT 207 04/01/2019       Chemistry      Component Value Date/Time   NA 137  04/01/2019 0605   K 3.9 04/01/2019 0605   CL 104 04/01/2019 0605   CO2 25 04/01/2019 0605   BUN <5 (L) 04/01/2019 0605   CREATININE 0.64 04/01/2019 0605      Component Value Date/Time   CALCIUM 9.2 04/01/2019 0605   ALKPHOS 43 04/01/2019 0605   AST 38 04/01/2019 0605   ALT 36 04/01/2019 0605   BILITOT 1.1 04/01/2019 6962        Assessment/Plan: The patient is a 68 year old African-American male with chronic alcohol abuse and questionable GERD presenting for an evaluation for chronic hepatitis C.  1. Chronic HCV - HCV Ab was positive in 11/2018, thus confirming past exposure to pathogen. Note: this screening test will remain positive/reactive life-long even if HCV infection has been cured/immunologically cleared. Most likely risk factor for acquisition was past IV drug use or intranasal cocaine use as he denies tattoo placement and blood transfusion. HCV viral load was confirmed at 2.12 million IU with HCV genotype 1a. HIV Ab was negative, thus excluding co-infection. As chronic infection has been confirmed, He underwent a FibroScan that showed only F0-F1 fibrosis (sharp contrast from RUQ U/S performed earlier this year that suggested early cirrhosis).. Likely will treat with harvoni for 8 weeks once treatment of his newly diagnosed CA is more clear. If XRT is needed to his neck, this may  affect his ability to reliably swallow pills. Similarly, if he needs chemotherapy, we will need to know what medications he will be receiving to ensure there is no medication interaction with his harvoni. Based on his FibroScan results, I would not anticipate his HCV to accelerate (barring need for high-dose steroids for a prolonged period), so will hold off on HCV treatment until his oncologic treatment plan is more confirmed. His F0-F1 fibrosis confirmation would predict a low risk of developing cirrhosis or primary [place] by a delay in HCV tx. Have asked pharmacy staff to determine prescriber of prilosec and what need for this medication is as he would have a higher chance for cure if this could be held. F/u in 6 weeks for re-assessment.  2.  Alcohol and substance abuse- I would prefer that the patient decrease his alcohol intake considerably to increase his success with DAA treatment.  I have recommended the patient decrease his alcohol consumption in half by the time of his next visit and that he regularly attend alcoholics anonymous meetings.  It would be preferable if the patient also had secured a sponsor for AA by the time of his next visit as well.  I discouraged the patient using intranasal cocaine to avoid reinfection or re-exposure for HCV.  3. Neck carcinoma - Biopsy performed on 04/01/2019 confirmed poorly differentiated squamous cell carcinoma. Unclear of oncologic plan at this time, so will see back in 6 weeks when hopefully, his need for XRT +/- chemo (and specifically which medications) will be further clarified, so we can assess for potential interactions with harvoni.  4. Health maintenance -  I have counselled the patient extensively re: the need for barrier precautions with sexual activity in order to prevent unwanted transmission of HCV. He must continue these practices until 3 months after treatment completion until a sustained virologic response (SVR) has been confirmed to establish a  cure of his infection. He expressed full understanding of these instructions. Vaccination for hepatitis A & B was recommended as was abstinence from cigarettes, alcohol, and illicit drugs.

## 2019-04-07 ENCOUNTER — Other Ambulatory Visit: Payer: Self-pay | Admitting: Otolaryngology

## 2019-04-11 ENCOUNTER — Ambulatory Visit
Admission: RE | Admit: 2019-04-11 | Discharge: 2019-04-11 | Disposition: A | Discharge status: Home / Self Care | Payer: Medicare HMO | Source: Ambulatory Visit | Attending: Otolaryngology | Admitting: Otolaryngology

## 2019-04-11 DIAGNOSIS — R070 Pain in throat: Secondary | ICD-10-CM

## 2019-04-11 DIAGNOSIS — C329 Malignant neoplasm of larynx, unspecified: Secondary | ICD-10-CM

## 2019-04-11 MED ORDER — IOPAMIDOL (ISOVUE-300) INJECTION 61%
75.0000 mL | Freq: Once | INTRAVENOUS | Status: AC | PRN
  Administered 2019-04-11: 75 mL via INTRAVENOUS

## 2019-04-13 ENCOUNTER — Other Ambulatory Visit: Payer: Self-pay | Admitting: Otolaryngology

## 2019-04-13 DIAGNOSIS — C329 Malignant neoplasm of larynx, unspecified: Secondary | ICD-10-CM

## 2019-04-18 ENCOUNTER — Telehealth: Payer: Self-pay | Admitting: *Deleted

## 2019-04-18 NOTE — Progress Notes (Signed)
Head and Neck Cancer Location of Tumor / Histology:  04/01/19 Diagnosis Larynx, biopsy, Right Supraglottic Tumor - POORLY DIFFERENTIATED SQUAMOUS CELL CARCINOMA WITH FOCAL SARCOMATOID CHANGES. SEE NOTE  Patient presented with symptoms of: pain with talking and swallowing for about 2 months. He also reported right ear pain.   Biopsies of larynx revealed: poorly differentiated squamous cell carcinoma.   Nutrition Status Yes No Comments  Weight changes? []  [x]    Swallowing concerns? []  [x]    PEG? []  [x]     Referrals Yes No Comments  Social Work? []  [x]    Dentistry? []  [x]  He missed an appointment yesterday with Dr. Enrique Sack  Swallowing therapy? []  [x]    Nutrition? []  [x]    Med/Onc? [x]  []  05/04/19 Dr. Maylon Peppers   Safety Issues Yes No Comments  Prior radiation? []  [x]    Pacemaker/ICD? []  [x]    Possible current pregnancy? []  [x]    Is the patient on methotrexate? []  [x]     Tobacco/Marijuana/Snuff/ETOH use: He has a history of cocaine and ETOH abuse, quitting recently on 04/01/19. He currently smokes cigarettes.   Past/Anticipated interventions by otolaryngology, if any:  03/22/19 Proc: Direct laryngoscopy with biopsy.  Cervical esophagoscopy.  Bronchoscopy. Surg:  [name]   Past/Anticipated interventions by medical oncology, if any: 05/04/19 Dr. Maylon Peppers   Current Complaints / other details:   PET 04/22/19

## 2019-04-18 NOTE — Telephone Encounter (Signed)
Oncology Nurse Navigator Documentation  Placed introductory call to new referral patient Mr. Pope.  LVMM primary number (sister), spoke with friend/caregiver Veronica at listed mobile number.  She indicated she provides him transportation to appts, provides care.  She stated she will drive to his home and call me back.    Introduced myself as the H&N oncology nurse navigator that works with Dr. ..... to whom he/she has been referred by Dr.    .  Estevan Ryder confirmed understanding of referral.  Briefly explained my role as his/her navigator, provided my contact information.   Confirmed understanding of upcoming appts and [place] location, explained arrival and registration process.  I encouraged him/her to call with questions/concerns as he/she moves forward with appts and procedures.    He/she verbalized understanding of information provided, expressed appreciation for my call.   Navigator Initial Assessment . Employment Status/FMLA/STD: . Support System: . PCP: . PCD: . Transportation Needs:  . Sensory Deficits/Language Barriers/Interpreter Needed:   Marland Kitchen Ambulation Needs:  . DME Used in Home:  . Psychosocial Needs:   . Concerns/Needs Understanding Cancer: . Self-Expressed Needs:

## 2019-04-20 ENCOUNTER — Encounter: Payer: Self-pay | Admitting: *Deleted

## 2019-04-20 ENCOUNTER — Other Ambulatory Visit: Payer: Self-pay | Admitting: *Deleted

## 2019-04-20 DIAGNOSIS — C321 Malignant neoplasm of supraglottis: Secondary | ICD-10-CM

## 2019-04-20 NOTE — Progress Notes (Signed)
Oncology Nurse Navigator Documentation  Per discussion during this morning's ENT Conference, referral placed to [facility] [place], Dr. Maylon Peppers.  Gayleen Orem, RN, BSN Head & Neck Oncology Nurse [place] at [place] [phone]'

## 2019-04-21 ENCOUNTER — Telehealth: Payer: Self-pay | Admitting: *Deleted

## 2019-04-22 ENCOUNTER — Other Ambulatory Visit: Payer: Self-pay

## 2019-04-22 ENCOUNTER — Encounter (HOSPITAL_COMMUNITY)
Admission: RE | Admit: 2019-04-22 | Discharge: 2019-04-22 | Disposition: A | Discharge status: Home / Self Care | Payer: Medicare HMO | Source: Ambulatory Visit | Attending: Otolaryngology | Admitting: Otolaryngology

## 2019-04-22 DIAGNOSIS — F17210 Nicotine dependence, cigarettes, uncomplicated: Secondary | ICD-10-CM | POA: Insufficient documentation

## 2019-04-22 DIAGNOSIS — B1920 Unspecified viral hepatitis C without hepatic coma: Secondary | ICD-10-CM | POA: Insufficient documentation

## 2019-04-22 DIAGNOSIS — E119 Type 2 diabetes mellitus without complications: Secondary | ICD-10-CM | POA: Diagnosis not present

## 2019-04-22 DIAGNOSIS — Z7982 Long term (current) use of aspirin: Secondary | ICD-10-CM | POA: Insufficient documentation

## 2019-04-22 DIAGNOSIS — Z79899 Other long term (current) drug therapy: Secondary | ICD-10-CM | POA: Insufficient documentation

## 2019-04-22 DIAGNOSIS — C329 Malignant neoplasm of larynx, unspecified: Secondary | ICD-10-CM | POA: Diagnosis not present

## 2019-04-22 DIAGNOSIS — C321 Malignant neoplasm of supraglottis: Secondary | ICD-10-CM | POA: Insufficient documentation

## 2019-04-22 LAB — GLUCOSE, CAPILLARY: Glucose-Capillary: 101 mg/dL — ABNORMAL HIGH (ref 70–99)

## 2019-04-22 MED ORDER — FLUDEOXYGLUCOSE F - 18 (FDG) INJECTION
6.7000 | Freq: Once | INTRAVENOUS | Status: AC | PRN
  Administered 2019-04-22: 12:00:00 6.7 via INTRAVENOUS

## 2019-04-22 NOTE — Telephone Encounter (Signed)
Oncology Nurse Navigator Documentation  Rec'd call from [name] and his caretaker Verdene Lennert.  Introduced myself as his navigator, explained my role on his Care Team, confirmed he has my phone number.  Confirmed understanding of upcoming appts:  Friday's 11:30 arrival for PET, NPO guidelines; telemedicine call 8/11 starting 2:30; 8/19 12:30 lab, 1:00 Dr. Maylon Peppers.  They voiced understanding.  He stated he is endentulous.  I explained the purpose of a dental evaluation prior to starting RT, indicated he wd be contacted to arrange an appt within a day or so of his appt with Dr. Isidore Moos.  I encouraged him to call me with questions/concerns.  Gayleen Orem, RN, BSN Head & Neck Oncology Nurse [place] at [place] [phone]

## 2019-04-26 ENCOUNTER — Encounter (HOSPITAL_COMMUNITY): Payer: Self-pay

## 2019-04-26 ENCOUNTER — Other Ambulatory Visit (HOSPITAL_COMMUNITY): Payer: Medicare HMO | Admitting: Dentistry

## 2019-04-27 ENCOUNTER — Ambulatory Visit
Admission: RE | Admit: 2019-04-27 | Discharge: 2019-04-27 | Disposition: A | Discharge status: Home / Self Care | Payer: Medicare HMO | Source: Ambulatory Visit | Attending: Radiation Oncology | Admitting: Radiation Oncology

## 2019-04-27 ENCOUNTER — Other Ambulatory Visit: Payer: Self-pay

## 2019-04-27 ENCOUNTER — Encounter: Payer: Self-pay | Admitting: Radiation Oncology

## 2019-04-27 ENCOUNTER — Encounter: Payer: Self-pay | Admitting: *Deleted

## 2019-04-27 DIAGNOSIS — C321 Malignant neoplasm of supraglottis: Secondary | ICD-10-CM

## 2019-04-27 NOTE — Progress Notes (Signed)
Radiation Oncology         [phone] ________________________________  Initial outpatient Consultation by phone as patient couldn't access WebEx during pandemic precautions  Name: [name] MRN: [id number]  Date: 04/27/2019  DOB: 04-08-1951  PX:[id number], Christean Grief, MD  Izora Gala, MD   REFERRING PHYSICIAN: Izora Gala, MD  DIAGNOSIS:    ICD-10-CM   1. 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 male who presented with dysphagia and difficulty breathing through his mouth. 20-25lb weight loss. Throat pain and ear pain on the right side.   He saw Dr. Erik Obey who performed laryngoscopy and appreciated a 2 to 3 cm ovoid pedunculated mass arising from the right aryepiglottic fold and impinging on the supraglottic airway.  The vocal cords appeared to be mobile.  Urgent panendoscopy and biopsy were recommended to protect his airway.  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 According to his op note, a bulky necrotic and semi-pedunculated tumor coming off the lateral surface of the right aryepiglottic fold and the anterior and lateral aspect of the piriform sinus on the right side.  Airway was compromised by the tumor mass at intubation.  CT scan of the neck on 04/11/2019 showed glottic closure with no asymmetry of the cords, correlate with laryngoscopy results.  Questionable right level 2 lymph node measured 7 mm.  No definite pathologic lymph nodes in the neck.  PET scan on 04/22/2019 revealed mild residual activity in the posterior right hypopharynx confined to the mucosa with no  evidence of metastatic disease to the neck or distantly.  I have personally reviewed his images  The patient is still smoking and drinking.  Oral intake has improved since debulking biopsy.    Nutrition Status Yes No Comments  Weight changes? []  [x]    Swallowing concerns? []  [x]    PEG? []  [x]     Referrals Yes No Comments  Social Work? []  [x]    Dentistry? []  [x]  He missed an appointment yesterday with Dr. Enrique Sack  Swallowing therapy? []  [x]    Nutrition? []  [x]    Med/Onc? [x]  []  05/04/19 Dr. Maylon Peppers   Safety Issues Yes No Comments  Prior radiation? []  [x]    Pacemaker/ICD? []  [x]    Possible current pregnancy? []  [x]    Is the patient on methotrexate? []  [x]     Tobacco/Marijuana/Snuff/ETOH use: He has a history of cocaine and ETOH abuse, quitting recently on 04/01/19. He currently smokes cigarettes.   PREVIOUS RADIATION THERAPY: No  PAST MEDICAL HISTORY:  has a past medical history of Diabetes mellitus without complication ([place]), ETOH abuse, Frequent urination, Glaucoma, Hepatitis C virus infection without hepatic coma, and Wears denture.    PAST SURGICAL HISTORY: Past Surgical History:  Procedure Laterality Date   ANKLE SURGERY  2011   right ankle   COLONOSCOPY  02/2019   polyps - Dr Havery Moros   DIRECT LARYNGOSCOPY N/A 04/01/2019   Procedure: DIRECT LARYNGOSCOPY WITH BIOPSY;  Surgeon: Jodi Marble, MD;  Location: [facility];  Service: ENT;  Laterality: N/A;   ESOPHAGOSCOPY N/A 04/01/2019   Procedure: ESOPHAGOSCOPY;  Surgeon: Jodi Marble, MD;  Location: [facility];  Service: ENT;  Laterality: N/A;  EYE SURGERY Right    KNEE SURGERY     LARYNGOSCOPY AND BRONCHOSCOPY N/A 04/01/2019   Procedure: BRONCHOSCOPY;  Surgeon: Jodi Marble, MD;  Location[facility];  Service: ENT;  Laterality: N/A;    FAMILY HISTORY: family history includes Breast cancer in his sister; Cancer in his brother; Colon cancer (age of onset: 52) in his brother.  SOCIAL HISTORY:  reports that he has been  smoking cigarettes. He has a 15.00 pack-year smoking history. He has never used smokeless tobacco. He reports current alcohol use. He reports current drug use. Drug: Cocaine.  ALLERGIES: Patient has no known allergies.  MEDICATIONS:  Current Outpatient Medications  Medication Sig Dispense Refill   acetaminophen (TYLENOL) 500 MG tablet Take 1,000 mg by mouth every 6 (six) hours as needed for moderate pain or headache.     atropine 1 % ophthalmic solution Place 1 drop into both eyes 2 (two) times a day.     cetirizine (ZYRTEC) 10 MG tablet Take 10 mg by mouth daily as needed for allergies.     ibuprofen (ADVIL) 200 MG tablet Take 400 mg by mouth every 6 (six) hours as needed for headache or moderate pain.     latanoprost (XALATAN) 0.005 % ophthalmic solution Place 1 drop into both eyes at bedtime.      lidocaine (XYLOCAINE) 2 % solution Use as directed 3 mLs in the mouth or throat 3 (three) times daily as needed for mouth pain.      prednisoLONE acetate (PRED FORTE) 1 % ophthalmic suspension Place 1 drop into the right eye 3 (three) times daily.      tolterodine (DETROL LA) 4 MG 24 hr capsule Take 4 mg by mouth daily.     aspirin EC 325 MG tablet Take 650 mg by mouth daily as needed for moderate pain.     No current facility-administered medications for this encounter.     REVIEW OF SYSTEMS:  Notable for that above.   PHYSICAL EXAM:  vitals were not taken for this visit.   General:   no acute distress   LABORATORY DATA:  Lab Results  Component Value Date   WBC 4.7 04/01/2019   HGB 14.9 04/01/2019   HCT 45.1 04/01/2019   MCV 97.2 04/01/2019   PLT 207 04/01/2019   CMP     Component Value Date/Time   NA 137 04/01/2019 0605   K 3.9 04/01/2019 0605   CL 104 04/01/2019 0605   CO2 25 04/01/2019 0605   GLUCOSE 130 (H) 04/01/2019 0605   BUN <5 (L) 04/01/2019 0605   CREATININE 0.64 04/01/2019 0605   CALCIUM 9.2 04/01/2019 0605   PROT 7.8 04/01/2019 0605   ALBUMIN 3.4 (L)  04/01/2019 0605   AST 38 04/01/2019 0605   ALT 36 04/01/2019 0605   ALKPHOS 43 04/01/2019 0605   BILITOT 1.1 04/01/2019 0605   GFRNONAA >60 04/01/2019 0605   GFRAA >60 04/01/2019 0605      No results found for: TSH   RADIOGRAPHY: Ct Soft Tissue Neck W Contrast  Addendum Date: 04/20/2019   ADDENDUM REPORT: 04/20/2019 08:53 ADDENDUM: This case was discussed at [facility] this morning. This CT was performed after surgical debulking of an exophytic laryngeal tumor. Most apparent on series 2, image 51 there is abnormal asymmetric hyperenhancement along the surface of the right piriform sinus, and perhaps also involving the bilateral AE folds. Electronically Signed   By: Genevie Ann M.D.   On: 04/20/2019 08:53  Result Date: 04/20/2019 CLINICAL DATA:  Recent diagnosis of carcinoma of the larynx. Untreated. EXAM: CT NECK WITH CONTRAST TECHNIQUE: Multidetector CT imaging of the neck was performed using the standard protocol following the bolus administration of intravenous contrast. CONTRAST:  6mL ISOVUE-300 IOPAMIDOL (ISOVUE-300) INJECTION 61% COMPARISON:  None. FINDINGS: Pharynx and larynx: The glottis is closed making evaluation for tumor difficult. There appears to be diffuse thickening of the vocal cords bilaterally but with symmetry. Piriform sinus clear bilaterally. Oropharynx and nasopharynx clear Salivary glands: No inflammation, mass, or stone. Thyroid: Negative Lymph nodes: 7 mm right level 2 lymph node. No enlarged or pathologic lymph nodes in the neck. Vascular: Normal vascular enhancement. Limited intracranial: Negative Visualized orbits: Negative Mastoids and visualized paranasal sinuses: Negative Skeleton: Multilevel disc degeneration and spurring throughout the cervical spine. Prominent anterior spurring C3 through C6. No acute skeletal abnormality. Upper chest: Lung apices clear bilaterally Other: None IMPRESSION: The glottis is closed with suboptimal evaluation  of the vocal cords. No asymmetry of the cords. Correlate with laryngoscopy results. No enlarged lymph nodes in the neck. 7 mm right level 2 lymph node may be reactive. No definite pathologic lymph nodes in the neck. Electronically Signed: By: Franchot Gallo M.D. On: 04/11/2019 21:07   Nm Pet Image Initial (pi) Skull Base To Thigh  Result Date: 04/22/2019 CLINICAL DATA:  Initial treatment strategy for laryngeal carcinoma. EXAM: NUCLEAR MEDICINE PET SKULL BASE TO THIGH TECHNIQUE: 6.7 mCi F-18 FDG was injected intravenously. Full-ring PET imaging was performed from the skull base to thigh after the radiotracer. CT data was obtained and used for attenuation correction and anatomic localization. Fasting blood glucose: 101 mg/dl COMPARISON:  None. FINDINGS: Mediastinal blood pool activity: SUV max Liver activity: SUV max renal cyst shift in NECK: Within the hypopharynx, there is mild asymmetric hypermetabolic activity associated with the RIGHT piriform sinus with SUV max equal 3.1. This activity is confined to the pharyngeal mucosa. There is activity related to the cricopharyngeal musculature is also greater on the RIGHT with SUV max equal 3.9. There are no hypermetabolic cervical lymph nodes. Mild activity associated with the proximal aspect the strap muscles on the LEFT and RIGHT. Incidental CT findings: none CHEST: No hypermetabolic mediastinal or hilar nodes. No suspicious pulmonary nodules on the CT scan. Incidental CT findings: none ABDOMEN/PELVIS: No abnormal hypermetabolic activity within the liver, pancreas, adrenal glands, or spleen. No hypermetabolic lymph nodes in the abdomen or pelvis. Incidental CT findings: none SKELETON: No focal hypermetabolic activity to suggest skeletal metastasis. Incidental CT findings: none IMPRESSION: 1. Mild residual activity in the posterior RIGHT hypopharynx confined to the mucosa. 2. No evidence of hypermetabolic metastatic lymph nodes in LEFT or RIGHT neck. 3. No evidence  distant metastatic disease. Electronically Signed   By: Suzy Bouchard M.D.   On: 04/22/2019 15:58      IMPRESSION/PLAN:  This is a delightful patient with head and neck cancer. I spoke with Dr. Erik Obey about him and the surgical option would be total laryngectomy.  For larynx preservation I do recommend definitive radiotherapy for this patient.  We discussed the potential risks, benefits, and side effects of radiotherapy. We talked in detail about acute and late effects. We discussed that some of the most bothersome acute effects may be mucositis, dysgeusia, salivary changes, skin irritation, hair loss, dehydration, weight loss and fatigue. We talked about late effects which include but are not necessarily limited to dysphagia, hypothyroidism, spinal cord injury, xerostomia, and neck edema. No guarantees of treatment were given.  The patient is enthusiastic about proceeding with treatment. I look forward to participating in the patient's care.    Simulation (treatment planning) will take place in the near future, pending dental evaluation  We also discussed that the treatment of head and neck cancer is a multidisciplinary process to maximize treatment outcomes and quality of life. For this reasons the following referrals have been or will be made:   Medical oncology to discuss chemotherapy -I spoke with Dr. Maylon Peppers about him and given his history of drug abuse, chemotherapy may not be realistic; furthermore the potential benefit of chemotherapy is diminished as he does not have lymph node positive disease   Dentistry for dental evaluation, possible extractions in the radiation fields, and /or advice on reducing risk of cavities, osteoradionecrosis, or other oral issues.   Nutritionist for nutrition support during and after treatment.   Speech language pathology for swallowing and/or speech therapy.   Social work for social support.    Baseline labs including TSH.  I asked the patient today  about tobacco use. The patient uses tobacco.  I advised the patient to quit. Services were offered by me today including outpatient counseling and pharmacotherapy. I assessed for the willingness to attempt to quit and provided encouragement and demonstrated willingness to make referrals and/or prescriptions to help the patient attempt to quit. The patient has follow-up with the oncologic team to touch base on their tobacco use and /or cessation efforts.  Right now he does not seem motivated to quit.  This encounter was provided by telemedicine platform telephone as he was not able to access Webex.  The patient has given verbal consent for this type of encounter and has been advised to only accept a meeting of this type in a secure network environment. The time spent during this encounter was over 20 minutes. The attendants for this meeting include [name]  and [name].  Gayleen Orem, RN, our Head and Neck Oncology Navigator was present on the call as was the patient's caregiver, Verdene Lennert During the encounter, [name] was located at [place].  Ordean E Antolin was located at home.   __________________________________________   [name], MD

## 2019-04-28 NOTE — Progress Notes (Signed)
Oncology Nurse Navigator Documentation  Joined Dr. Isidore Moos for Tele-consult with [name] r e  He was accompanied by his caregiver [place]. He reported:  Some dysphagia d/t throat pain that radiates to R ear when swallowing.  Recent weight loss of about 20 lbs from 150 lb baseline d/t dysphagia.  Improvement of oral intake s/p 7/17 bx.  Still smoking/drinking. He voiced understanding of Dr. Pearlie Oyster:  Recommendation to discuss surgical tmt option with ENT [facility] that may include referral to [facility]; Dr. Isidore Moos to discuss with Dr. Erik Obey.  Explanation of XRT w/wo chemotherapy.  Discussion of temporary PEG to optimize nutritional intake during tmt.  Encouragement to curtail cigarettes/ETOH to optimize tmt outcome. I encouraged them to call me with questions/concerns.  Gayleen Orem, RN, BSN Head & Neck Oncology Nurse [place] [place] [facility] [place] [phone]

## 2019-05-02 ENCOUNTER — Encounter (HOSPITAL_COMMUNITY): Payer: Self-pay | Admitting: Dentistry

## 2019-05-02 ENCOUNTER — Other Ambulatory Visit: Payer: Self-pay

## 2019-05-02 ENCOUNTER — Other Ambulatory Visit: Payer: Self-pay | Admitting: Hematology

## 2019-05-02 ENCOUNTER — Ambulatory Visit (HOSPITAL_COMMUNITY): Payer: Self-pay | Admitting: Dentistry

## 2019-05-02 VITALS — BP 155/102 | HR 60 | Temp 99.7°F

## 2019-05-02 DIAGNOSIS — C321 Malignant neoplasm of supraglottis: Secondary | ICD-10-CM

## 2019-05-02 DIAGNOSIS — K0820 Unspecified atrophy of edentulous alveolar ridge: Secondary | ICD-10-CM

## 2019-05-02 DIAGNOSIS — K0889 Other specified disorders of teeth and supporting structures: Secondary | ICD-10-CM

## 2019-05-02 DIAGNOSIS — Z01818 Encounter for other preprocedural examination: Secondary | ICD-10-CM | POA: Diagnosis not present

## 2019-05-02 DIAGNOSIS — K08109 Complete loss of teeth, unspecified cause, unspecified class: Secondary | ICD-10-CM

## 2019-05-02 NOTE — Progress Notes (Signed)
DENTAL CONSULTATION  Date of Consultation:  05/02/2019 Patient Name:   [name] Date of Birth:   1950-11-03 Medical Record Number: [id number]  COVID 19 SCREENING: The patient does not symptoms concerning for COVID-19 infection (Including fever, chills, cough, or new SHORTNESS OF BREATH).    VITALS: BP (!) 155/102 (BP Location: Right Arm)   Pulse 60   Temp 99.7 F (37.6 C)   CHIEF COMPLAINT: Patient referred by Dr. Isidore Moos for dental consultation.  HPI: [name] is a 69 year old male recently diagnosed with squamous cell carcinoma of the supraglottic larynx.  Patient with anticipated radiation therapy and possible chemotherapy.  Patient is now seen as part of a medically necessary pre-chemoradiation therapy dental protocol examination.  The patient currently denies any acute dental pain, swellings, or abscesses.  Patient indicates that he is edentulous.  Patient last saw dentist in 2013 for denture repair.  Patient saw Dr. Lovena Neighbours at that time.  Patient had upper and lower complete dentures fabricated by Dr. Lovena Neighbours in 2011.  Patient currently only has the upper complete denture.  Patient lost the lower denture approximately 1 to 2 years ago.  Patient indicates that the upper denture "fits pretty good".  The patient denies having dental phobia.  PROBLEM LIST: Patient Active Problem List   Diagnosis Date Noted  . 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[place]) 05/02/2019    Priority: High  . Squamous cell carcinoma of neck 04/01/2019    PMH: Past Medical History:  Diagnosis Date  . Diabetes mellitus without complication (HCC)    no meds  . ETOH abuse   . Frequent urination   . Glaucoma   . Hepatitis C virus infection without hepatic coma    dx'ed in 11/2018  . Wears denture    upper only; lost lower denture    PSH: Past Surgical History:  Procedure Laterality Date  . ANKLE SURGERY  2011   right ankle  . COLONOSCOPY  02/2019   polyps - Dr  Havery Moros  . DIRECT LARYNGOSCOPY N/A 04/01/2019   Procedure: DIRECT LARYNGOSCOPY WITH BIOPSY;  Surgeon: Jodi Marble, MD;  Location: [place];  Service: ENT;  Laterality: N/A;  . ESOPHAGOSCOPY N/A 04/01/2019   Procedure: ESOPHAGOSCOPY;  Surgeon: Jodi Marble, MD;  Location: [place];  Service: ENT;  Laterality: N/A;  . EYE SURGERY Right   . KNEE SURGERY    . LARYNGOSCOPY AND BRONCHOSCOPY N/A 04/01/2019   Procedure: BRONCHOSCOPY;  Surgeon: Jodi Marble, MD;  Location: [facility] OR;  Service: ENT;  Laterality: N/A;    ALLERGIES: No Known Allergies  MEDICATIONS: Current Outpatient Medications  Medication Sig Dispense Refill  . acetaminophen (TYLENOL) 500 MG tablet Take 1,000 mg by mouth every 6 (six) hours as needed for moderate pain or headache.    Marland Kitchen atropine 1 % ophthalmic solution Place 1 drop into both eyes 2 (two) times a day.    . cetirizine (ZYRTEC) 10 MG tablet Take 10 mg by mouth daily as needed for allergies.    Marland Kitchen ibuprofen (ADVIL) 200 MG tablet Take 400 mg by mouth every 6 (six) hours as needed for headache or moderate pain.    Marland Kitchen latanoprost (XALATAN) 0.005 % ophthalmic solution Place 1 drop into both eyes at bedtime.     . prednisoLONE acetate (PRED FORTE) 1 % ophthalmic suspension Place 1 drop into the right eye 3 (three) times daily.     Marland Kitchen tolterodine (DETROL LA) 4 MG 24 hr capsule Take 4 mg  by mouth daily.    Marland Kitchen aspirin EC 325 MG tablet Take 650 mg by mouth daily as needed for moderate pain.    Marland Kitchen lidocaine (XYLOCAINE) 2 % solution Use as directed 3 mLs in the mouth or throat 3 (three) times daily as needed for mouth pain.      No current facility-administered medications for this visit.     LABS: Lab Results  Component Value Date   WBC 4.7 04/01/2019   HGB 14.9 04/01/2019   HCT 45.1 04/01/2019   MCV 97.2 04/01/2019   PLT 207 04/01/2019      Component Value Date/Time   NA 137 04/01/2019 0605   K 3.9 04/01/2019 0605   CL 104 04/01/2019 0605   CO2 25 04/01/2019 0605    GLUCOSE 130 (H) 04/01/2019 0605   BUN <5 (L) 04/01/2019 0605   CREATININE 0.64 04/01/2019 0605   CALCIUM 9.2 04/01/2019 0605   GFRNONAA >60 04/01/2019 0605   GFRAA >60 04/01/2019 0605   Lab Results  Component Value Date   INR 1.03 01/03/2018   INR 1.19 06/16/2013   INR 1.6 (H) 10/21/2007   No results found for: PTT  SOCIAL HISTORY: Social History   Socioeconomic History  . Marital status: Single    Spouse name: Not on file  . Number of children: 2  . Years of education: Not on file  . Highest education level: Not on file  Occupational History  . Not on file  Social Needs  . Financial resource strain: Not on file  . Food insecurity    Worry: Not on file    Inability: Not on file  . Transportation needs    Medical: No    Non-medical: No  Tobacco Use  . Smoking status: Current Some Day Smoker    Packs/day: 0.30    Years: 50.00    Pack years: 15.00    Types: Cigarettes  . Smokeless tobacco: Never Used  . Tobacco comment: He reports smoking around 2 cigarettes daily 04/27/19  Substance and Sexual Activity  . Alcohol use: Yes    Comment: occvasional  . Drug use: Yes    Types: Cocaine    Comment: none for 6 months  . Sexual activity: Not on file  Lifestyle  . Physical activity    Days per week: Not on file    Minutes per session: Not on file  . Stress: Not on file  Relationships  . Social Herbalist on phone: Not on file    Gets together: Not on file    Attends religious service: Not on file    Active member of club or organization: Not on file    Attends meetings of clubs or organizations: Not on file    Relationship status: Not on file  . Intimate partner violence    Fear of current or ex partner: No    Emotionally abused: No    Physically abused: No    Forced sexual activity: No  Other Topics Concern  . Not on file  Social History Narrative   Patient is divorced with 2 children.   Patient is currently living with his sister.   Patient with  a history of smoking a third of pack of cigarettes daily for 50 years.  Patient currently smoking 2 to 3 cigarettes/day.   Patient has never used smokeless tobacco.   Patient with occasional use of alcohol.   Patient last used cocaine approximately 6 months ago.  Patient denies  use of marijuana.    FAMILY HISTORY: Family History  Problem Relation Age of Onset  . Breast cancer Sister   . Colon cancer Brother 52       ????  . Cancer Brother     REVIEW OF SYSTEMS: Reviewed with the patient as per History of present illness. Psych: Patient denies having dental phobia.  DENTAL HISTORY: CHIEF COMPLAINT: Patient referred by Dr. Isidore Moos for dental consultation.  HPI: [name] is a 68 year old male recently diagnosed with squamous cell carcinoma of the supraglottic larynx.  Patient with anticipated radiation therapy and possible chemotherapy.  Patient is now seen as part of a medically necessary pre-chemoradiation therapy dental protocol examination.  The patient currently denies any acute dental pain, swellings, or abscesses.  Patient indicates that he is edentulous.  Patient last saw dentist in 2013 for denture repair.  Patient saw Dr. Lovena Neighbours at that time.  Patient had upper and lower complete dentures fabricated by Dr. Lovena Neighbours in 2011.  Patient currently only has the upper complete denture.  Patient lost the lower denture approximately 1 to 2 years ago.  Patient indicates that the upper denture "fits pretty good".  The patient denies having dental phobia.  DENTAL EXAMINATION: GENERAL: The patient is a well-developed, slightly built male in no acute distress. HEAD AND NECK: I do not palpate any neck lymphadenopathy.  The patient denies acute TMJ symptoms.  Maximum inter-incisal opening is measured at greater than 55 mm. INTRAORAL EXAM: Patient has normal saliva.  I do not see any evidence of oral abscess formation. DENTITION: The patient is edentulous.  There are no retained  root tips or impacted teeth.  There is atrophy of the edentulous alveolar ridges. PROSTHODONTIC: The patient has an upper complete denture that was fabricated in 2011 along with a lower denture.  The patient has since lost the lower complete denture 1 to 2 years ago.  The upper complete denture is stable but has decreased retention. OCCLUSION: Patient has a poor occlusal scheme secondary to loss of the lower denture.  RADIOGRAPHIC INTERPRETATION: An orthopantogram was taken today. Patient is edentulous.  There is atrophy of the edentulous alveolar ridges.  There is pneumatization of the maxillary sinuses. The patient does have a history of left and right mandible fractures noted on the CT scan of March of 2013.   ASSESSMENTS: 1.  Squamous cell carcinoma of the right supraglottic larynx 2.  Pre-chemoradiation therapy dental protocol 3.  The patient is edentulous. 4.  There are no retained roots or impacted teeth. 5.  There is atrophy of the edentulous alveolar ridges. 6.  There is history of mandible fracture involving the left and right mandible in 2013 7.  Ill fitting maxillary complete denture and no current lower complete denture.   PLAN/RECOMMENDATIONS: 1. I discussed the risks, benefits, and complications of various treatment options with the patient in relationship to his medical and dental conditions, anticipated radiation therapy and possible chemotherapy, and risk for side effects to include xerostomia, trismus, mucositis, taste changes, gum and jawbone changes, and risk for infection and osteoradionecrosis. We discussed various treatment options to include no treatment, implant therapy, and fabrication of a new upper and lower complete denture by the dentist of his choice.  The patient currently wishes to defer any dental treatment at this time.  The patient will follow-up with a dentist of his choice that accepts dental Medicaid for fabrication of a new upper and lower complete  denture approximately 3 months  after the last radiation therapy has been provided.  Patient will need to have the treating dentist obtain override of the current 10-year limitation on new dentures due to the history of cancer.  2. Discussion of findings with medical team and coordination of future medical and dental care as needed.  The patient is currently cleared for chemoradiation therapy at this time.  I spent in excess of  120 minutes during the conduct of this consultation and >50% of this time involved direct face-to-face encounter for counseling and/or coordination of the patient's care.    Lenn Cal, DDS

## 2019-05-02 NOTE — Patient Instructions (Signed)
COVID-19 Education: The signs and symptoms of COVID-19 were discussed with the patient and how to seek care for testing (follow up with PCP or arrange E-visit).   The importance of social distancing was discussed today.  RADIATION THERAPY AND DECISIONS REGARDING YOUR TEETH  Xerostomia (dry mouth) Your salivary glands may be in the filed of radiation.  Radiation may include all or part of your saliva glands.  This will cause your saliva to dry up and you will have a dry mouth.  The dry mouth will be for the rest of your life unless your radiation oncologist tells you otherwise.  Your saliva has many functions:  Saliva wets your tongue for speaking.  It coats your teeth and the inside of your mouth for easier movement.  It helps with chewing and swallowing food.  It helps clean away harmful acid and toxic products made by the germs in your mouth, therefore it helps prevent cavities.  It kills some germs in your mouth and helps to prevent gum disease.  It helps to carry flavor to your taste buds.  Once you have lost your saliva you will be at higher risk for tooth decay and gum disease.  What can be done to help improve your mouth when there's not enough saliva:  1.  Your dentist may give a prescription for Salagen.  It will not bring back all of your saliva but may bring back some of it.  Also your saliva may be thick and ropy or white and foamy. It will not feel like it use to feel.  2.  You will need to swish with water every time your mouth feels dry.  YOU CANNOT suck on any cough drops, mints, lemon drops, candy, vitamin C or any other products.  You cannot use anything other than water to make your mouth feel less dry.  If you want to drink anything else you have to drink it all at once and brush afterwards.  Be sure to discuss the details of your diet habits with your dentist or hygienist.  Radiation caries: This is decay that happens very quickly once your mouth is very dry due to  radiation therapy.  Normally cavities take six months to two years to become a problem.  When you have dry mouth cavities may take as little as eight weeks to cause you a problem.  This is why dental check ups every two months are necessary as long as you have a dry mouth. Radiation caries typically, but not always, start at your gum line where it is hard to see the cavity.  It is therefore also hard to fill these cavities adequately.  This high rate of cavities happens because your mouth no longer has saliva and therefore the acid made by the germs starts the decay process.  Whenever you eat anything the germs in your mouth change the food into acid.  The acid then burns a small hole in your tooth.  This small hole is the beginning of a cavity.  If this is not treated then it will grow bigger and become a cavity.  The way to avoid this hole getting bigger is to use fluoride every evening as prescribed by your dentist.  You have to make sure that your teeth are very clean before you use the fluoride.  This fluoride in turn will strengthen your teeth and prepare them for another day of fighting acid.  If you develop radiation caries many times the damage is   so large that you will have to have all your teeth removed.  This could be a big problem if some of these teeth are in the field of radiation.  Further details of why this could be a big problem will follow.  (See Osteoradionecrosis).  Loss of taste (dysgeusia) This happens to varying degrees once you've had radiation therapy to your jaw region.  Many times taste is not completely lost but becomes limited.  The loss of taste is mostly due to radiation affecting your taste buds.  However if you have no saliva in your mouth to carry the flavor to your taste buds it would be difficult for your taste buds to taste anything.  That is why using water or a prescription for Salagen prior to meals and during meals may help with some of the taste.  Keep in mind that  taste generally returns very slowly over the course of several months or several years after radiation therapy.  Don't give up hope.  Trismus According to your Radiation Oncologist your TMJ or jaw joints are going to be partially or fully in the field of radiation.  This means that over time the muscles that help you open and close your mouth may get stiff.  This will potentially result in your not being able to open your mouth wide enough or as wide as you can open it now.  Le me give you an example of how slowly this happens and how unaware people are of it.  A gentlemen that had radiation therapy two years ago came back to me complaining that bananas are just too large for him to be able to fit them in between his teeth.  He was not able to open wide enough to bite into a banana.  This happens slowly and over a period of time.  What do we do to try and prevent this?  Your dentist will probably give you a stack of sticks called a trismus exercise device .  This stack will help your remind your muscles and your jaw joint to open up to the same distance every day.  Use these sticks every morning when you wake up according to the instructions given by the dentist.   You must use these sticks for at least one to two years after radiation therapy.  The reason for that is because it happens so slowly and keeps going on for about two years after radiation therapy.  Your hospital dentist will help you monitor your mouth opening and make sure that it's not getting smaller.  Osteoradionecrosis (ORN) This is a condition where your jaw bone after having had radiation therapy becomes very dry.  It has very little blood supply to keep it alive.  If you develop a cavity that turns into an abscess or an infection then the jaw bone does not have enough blood supply to help fight the infection.  At this point it is very likely that the infection could cause the death of your jaw bone.  When you have dead bone it has to be  removed.  Therefore you might end up having to have surgery to remove part of your jaw bone, the part of the jaw bone that has been affected.   Healing is also a problem if you are to have surgery in the areas where the bone has had radiation therapy.  The same reasons apply.  If you have surgery you need more blood supply which is not available.    When blood supply and oxygen are not available again, there is a chance for the bone to die.  Occasionally ORN happens on its own with no obvious reason.  This is quite rare.  We believe that patients who continue to smoke and/or drink alcohol have a higher chance of having this bone problem.  Therefore once your jaw bone has had radiation therapy if there are any teeth in that area, you should never have them pulled.  You should also never have any surgery on your teeth or gums in that area unless the oral surgeon or Periodontist is aware of your history of radiation. There is some expensive management techniques that might be used to limit your risks.  The risks for ORN either from infection or spontaneous ( or on it's own) are life long.    TRISMUS  Trismus is a condition where the jaw does not allow the mouth to open as wide as it usually does.  This can happen almost suddenly, or in other cases the process is so slow, it is hard to notice it-until it is too far along.  When the jaw joints and/or muscles have been exposed to radiation treatments, the onset of Trismus is very slow.  This is because the muscles are losing their stretching ability over a long period of time, as long as 2 YEARS after the end of radiation.  It is therefore important to exercise these muscles and joints.  TRISMUS EXERCISES   Stack of tongue depressors measuring the same or a little less than the last documented MIO (Maximum Interincisal Opening).  Secure them with a rubber band on both ends.  Place the stack in the patient's mouth, supporting the other end.  Allow 30  seconds for muscle stretching.  Rest for a few seconds.  Repeat 3-5 times  For all radiation patients, this exercise is recommended in the mornings and evenings unless otherwise instructed.  The exercise should be done for a period of 2 YEARS after the end of radiation.  MIO should be checked routinely on recall dental visits by the general dentist or the hospital dentist.  The patient is advised to report any changes, soreness, or difficulties encountered when doing the exercises.  

## 2019-05-02 NOTE — Progress Notes (Signed)
[facility] CONSULT NOTE  Patient Care Team: [name], MD as PCP - General (Internal Medicine) Izora Gala, MD as Consulting Physician (Otolaryngology) [name], MD as Attending Physician (Radiation Oncology) Leota Sauers, RN as Oncology Nurse Navigator  HEME/ONC OVERVIEW: 1.  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 Polysubstance abuse, including IV/inhaled cocaine   2. EtOH abuse  3. 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 Erik Obey of ENT in [id number] for evaluation of throat pain with intermittent blood in the secretions.  He underwent laryngoscopy, which showed a tumor arising from the lateral surface of the right aryepiglottic fold and anterior/lateral aspect of the piriform sinus.  The tumor was debulked during the surgery, and the pathology showed poorly differentiated squamous cell carcinoma with focal sarcomatoid changes.  Postop CT neck showed no definite cervical adenopathy, and PET scan showed mild residual activity in the posterior right hypopharynx without evidence of cervical or metastatic disease.  Patient was referred to radiation oncology and medical oncology for discussion of concurrent chemoradiation. -I  reviewed the imaging and pathology results in detail with the patient -Patient had excision/resection of the supraglottic tumor during the initial biopsy, and the bulk of the tumor was removed; there is no evidence of nodal involvement on PET or CT -Ideally, he should consider underoing further surgical resection, and based on the surgical pathology, his adjuvant treatment regimen can be determined -However, patient refused any surgical intervention, but would be willing to consider other treatment modalities  -I also discussed with the patient NCCN guidelines in detail -In the setting of debulked primary T2 tumor (somewhat equivalent to laryngx-preserving surgery), definitive RT is an acceptable treatment option for those patients foregoing further surgical resection -Furthermore, in the absence of any bulky tumor or LN involvement, there is no clearly established benefit for concurrent chemotherapy  -Finally, given the patient's chronic malnutrition, ongoing EtOH and recent history of polysubstance abuse, and borderline performance status, he is not a good candidate for chemotherapy due to the significant risk of adverse outcomes -Therefore, without any evidence of locally advanced disease, such as T3-T4 tumor or lymph node positivity, and given his multiple comorbidities, I would favor proceeding with definitive RT instead of concurrent chemoRT  -After lengthy discussions and multiple reinforcements, patient expressed understanding and agreed to proceed with RT as planned   EtOH abuse -Patient reports currently drinking ~24 oz of beer per day (down from up to 80 oz per night) -However, his LFT's showed persistent transaminitis, suggesting that he is likely drinking more than what he admits  -I strongly counseled the patient on EtOH moderation, preferably abstinence, during any adjuvant treatment   Polysubstance abuse -Patient reports that he last used inhaled cocaine in 03/2019 -I emphasized the  importance of abstinence from all illicit substances, as patients  undergoing radiation can have mucositis that may periodically require opioid medications, and taking opioid medication with illicit substances significantly increase the risk of respiratory suppression -Patient expressed understanding and agreed to the plan  Untreated Hepatitis C -Likely due to hx of IV drug abuse -Followed by ID -Treatment not yet initiated due to newly diagnosed malignancy -Continue follow-up with ID   No orders of the defined types were placed in this encounter.  A total of more than 60 minutes were spent face-to-face with the patient during this encounter and over half of that time was spent on counseling and coordination of care as outlined above.    All questions were answered. The patient knows to call the clinic with any problems, questions or concerns.  Return as needed. Continue follow-up with radiation oncology.  Tish Men, MD 05/04/2019 1:22 PM   CHIEF COMPLAINTS/PURPOSE OF CONSULTATION:  "My throat is scratchy"  HISTORY OF PRESENTING ILLNESS:  [name] 68 y.o. male is here because of newly diagnosed squamous cell carcinoma of the right supraglottic larynx.  [facility] in mid-03/2019 for evaluation of several months of throat pain, associated with occasional blood in the sputum and odynophagia.  He also has lost some weight since onset of the symptoms.  He was prescribed viscous lidocaine with minimal relief.  He underwent direct laryngoscopy, which showed tumor arising from the lateral surface of the right aryepiglottic fold, the biopsy which showed squamous cell carcinoma.  CT and PET scan did not demonstrate any definite metastatic disease.  Patient was referred to radiation oncology and medical oncology for discussion of chemotherapy.  He reports that since the resection of the laryngeal tumor, his throat is improving, and he has been able to eat more. He  has some scratchy throat, but denies any significant dysphagia or odynophagia. He has periodic loose stools, but denies any hematochezia or melena. He currently smokes ~2 cigarettes per day but sometimes more when he drinks. He reports drinking ~24 oz of beer per night, but used to drink more (up to 80oz beer). He reports smoking cocaine, last 3-4 months ago, but denies any IV drug use (though previously documented in ID clinic notes).   I have reviewed his chart and materials related to his cancer extensively and collaborated history with the patient. Summary of oncologic history is as follows: Oncology History  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[facility])  04/01/2019 Procedure   Direct laryngoscopy w/ debulking of the tumor arising from the lateral surface of the right aryepiglottic fold and anterior/lateral aspect of the piriform sinus on the right side    04/01/2019 Pathology Results   Accession: [id number]  Larynx, biopsy, Right Supraglottic Tumor - POORLY DIFFERENTIATED SQUAMOUS CELL CARCINOMA WITH FOCAL SARCOMATOID CHANGES. SEE NOTE   04/11/2019 Imaging   CT neck: IMPRESSION: The glottis is closed with suboptimal evaluation of the vocal cords. No asymmetry of the cords. Correlate with laryngoscopy results.   No enlarged lymph nodes in the neck. 7 mm right level 2 lymph node may be reactive. No definite pathologic lymph nodes in the neck.   04/22/2019 Imaging   PET: IMPRESSION: 1. Mild residual activity in the posterior RIGHT hypopharynx confined to the mucosa. 2. No evidence of hypermetabolic metastatic lymph nodes in LEFT or RIGHT neck. 3. No evidence distant metastatic disease.   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[facility])   05/02/2019 Cancer Staging   Staging form: Larynx -  Supraglottis, AJCC 8th Edition - Clinical: Stage II (cT2, cN0, cM0) - Signed by [name], MD on 05/02/2019     MEDICAL HISTORY:  Past  Medical History:  Diagnosis Date  . Diabetes mellitus without complication (HCC)    no meds  . ETOH abuse   . Frequent urination   . Glaucoma   . Hepatitis C virus infection without hepatic coma    dx'ed in 11/2018  . Wears denture    upper only; lost lower denture    SURGICAL HISTORY: Past Surgical History:  Procedure Laterality Date  . ANKLE SURGERY  2011   right ankle  . COLONOSCOPY  02/2019   polyps - Dr Havery Moros  . DIRECT LARYNGOSCOPY N/A 04/01/2019   Procedure: DIRECT LARYNGOSCOPY WITH BIOPSY;  Surgeon: Jodi Marble, MD;  Location: [facility];  Service: ENT;  Laterality: N/A;  . ESOPHAGOSCOPY N/A 04/01/2019   Procedure: ESOPHAGOSCOPY;  Surgeon: Jodi Marble, MD;  Location: [facility];  Service: ENT;  Laterality: N/A;  . EYE SURGERY Right   . KNEE SURGERY    . LARYNGOSCOPY AND BRONCHOSCOPY N/A 04/01/2019   Procedure: BRONCHOSCOPY;  Surgeon: Jodi Marble, MD;  Location: [facility];  Service: ENT;  Laterality: N/A;    SOCIAL HISTORY: Social History   Socioeconomic History  . Marital status: Single    Spouse name: Not on file  . Number of children: 2  . Years of education: Not on file  . Highest education level: Not on file  Occupational History  . Not on file  Social Needs  . Financial resource strain: Not on file  . Food insecurity    Worry: Not on file    Inability: Not on file  . Transportation needs    Medical: No    Non-medical: No  Tobacco Use  . Smoking status: Current Some Day Smoker    Packs/day: 0.30    Years: 50.00    Pack years: 15.00    Types: Cigarettes  . Smokeless tobacco: Never Used  . Tobacco comment: He reports smoking around 2 cigarettes daily 04/27/19  Substance and Sexual Activity  . Alcohol use: Yes    Comment: occvasional  . Drug use: Yes    Types: Cocaine    Comment: none for 6 months  . Sexual activity: Not on file  Lifestyle  . Physical activity    Days per week: Not on file    Minutes per session: Not on file  . Stress: Not on  file  Relationships  . Social Herbalist on phone: Not on file    Gets together: Not on file    Attends religious service: Not on file    Active member of club or organization: Not on file    Attends meetings of clubs or organizations: Not on file    Relationship status: Not on file  . Intimate partner violence    Fear of current or ex partner: No    Emotionally abused: No    Physically abused: No    Forced sexual activity: No  Other Topics Concern  . Not on file  Social History Narrative   Patient is divorced with 2 children.   Patient is currently living with his sister.   Patient with a history of smoking a third of pack of cigarettes daily for 50 years.  Patient currently smoking 2 to 3 cigarettes/day.   Patient has never used smokeless tobacco.   Patient with occasional use of alcohol.   Patient  last used cocaine approximately 6 months ago.  Patient denies use of marijuana.    FAMILY HISTORY: Family History  Problem Relation Age of Onset  . Breast cancer Sister   . Colon cancer Brother 33       ????  . Cancer Brother     ALLERGIES:  has No Known Allergies.  MEDICATIONS:  Current Outpatient Medications  Medication Sig Dispense Refill  . acetaminophen (TYLENOL) 500 MG tablet Take 1,000 mg by mouth every 6 (six) hours as needed for moderate pain or headache.    Marland Kitchen aspirin EC 325 MG tablet Take 650 mg by mouth daily as needed for moderate pain.    Marland Kitchen atropine 1 % ophthalmic solution Place 1 drop into both eyes 2 (two) times a day.    . cetirizine (ZYRTEC) 10 MG tablet Take 10 mg by mouth daily as needed for allergies.    Marland Kitchen ibuprofen (ADVIL) 200 MG tablet Take 400 mg by mouth every 6 (six) hours as needed for headache or moderate pain.    Marland Kitchen latanoprost (XALATAN) 0.005 % ophthalmic solution Place 1 drop into both eyes at bedtime.     . lidocaine (XYLOCAINE) 2 % solution Use as directed 3 mLs in the mouth or throat 3 (three) times daily as needed for mouth pain.      . prednisoLONE acetate (PRED FORTE) 1 % ophthalmic suspension Place 1 drop into the right eye 3 (three) times daily.     Marland Kitchen tolterodine (DETROL LA) 4 MG 24 hr capsule Take 4 mg by mouth daily.     No current facility-administered medications for this visit.     REVIEW OF SYSTEMS:   Constitutional: ( - ) fevers, ( - )  chills , ( - ) night sweats Eyes: ( - ) blurriness of vision, ( - ) double vision, ( - ) watery eyes Ears, nose, mouth, throat, and face: ( - ) mucositis, ( + ) sore throat Respiratory: ( - ) cough, ( - ) dyspnea, ( - ) wheezes Cardiovascular: ( - ) palpitation, ( - ) chest discomfort, ( - ) lower extremity swelling Gastrointestinal:  ( - ) nausea, ( - ) heartburn, ( + ) change in bowel habits Skin: ( - ) abnormal skin rashes Lymphatics: ( - ) new lymphadenopathy, ( - ) easy bruising Neurological: ( - ) numbness, ( - ) tingling, ( - ) new weaknesses Behavioral/Psych: ( - ) mood change, ( - ) new changes  All other systems were reviewed with the patient and are negative.  PHYSICAL EXAMINATION: ECOG PERFORMANCE STATUS: 2 - Symptomatic, <50% confined to bed  Vitals:   05/04/19 1222  BP: (!) 142/94  Pulse: 68  Resp: 18  Temp: 99.1 F (37.3 C)  SpO2: 100%   Filed Weights   05/04/19 1222  Weight: 139 lb 1.6 oz (63.1 kg)    GENERAL: alert, no distress, thin, older than stated age  SKIN: skin color, texture, turgor are normal, no rashes or significant lesions EYES: conjunctiva are pink and non-injected, sclera clear OROPHARYNX: poor dentition, no apparent oral lesions  NECK: thin, non-tender LYMPH:  no palpable lymphadenopathy in the cervical LUNGS: clear to auscultation with normal breathing effort HEART: regular rate & rhythm, no murmurs, no lower extremity edema ABDOMEN: soft, non-tender, non-distended, normal bowel sounds Musculoskeletal: no cyanosis of digits and no clubbing  PSYCH: alert & oriented, fluent speech  LABORATORY DATA:  I have reviewed the  data as listed Lab Results  Component  Value Date   WBC 3.8 (L) 05/04/2019   HGB 14.0 05/04/2019   HCT 42.7 05/04/2019   MCV 98.2 05/04/2019   PLT 152 05/04/2019   Lab Results  Component Value Date   NA 141 05/04/2019   K 4.3 05/04/2019   CL 105 05/04/2019   CO2 26 05/04/2019    RADIOGRAPHIC STUDIES: I have personally reviewed the radiological images as listed and agreed with the findings in the report. Ct Soft Tissue Neck W Contrast  Addendum Date: 04/20/2019   ADDENDUM REPORT: 04/20/2019 08:53 ADDENDUM: This case was discussed at [facility] and [facility] Multi Disciplinary Conference this morning. This CT was performed after surgical debulking of an exophytic laryngeal tumor. Most apparent on series 2, image 51 there is abnormal asymmetric hyperenhancement along the surface of the right piriform sinus, and perhaps also involving the bilateral AE folds. Electronically Signed   By: Genevie Ann M.D.   On: 04/20/2019 08:53   Result Date: 04/20/2019 CLINICAL DATA:  Recent diagnosis of carcinoma of the larynx. Untreated. EXAM: CT NECK WITH CONTRAST TECHNIQUE: Multidetector CT imaging of the neck was performed using the standard protocol following the bolus administration of intravenous contrast. CONTRAST:  5mL ISOVUE-300 IOPAMIDOL (ISOVUE-300) INJECTION 61% COMPARISON:  None. FINDINGS: Pharynx and larynx: The glottis is closed making evaluation for tumor difficult. There appears to be diffuse thickening of the vocal cords bilaterally but with symmetry. Piriform sinus clear bilaterally. Oropharynx and nasopharynx clear Salivary glands: No inflammation, mass, or stone. Thyroid: Negative Lymph nodes: 7 mm right level 2 lymph node. No enlarged or pathologic lymph nodes in the neck. Vascular: Normal vascular enhancement. Limited intracranial: Negative Visualized orbits: Negative Mastoids and visualized paranasal sinuses: Negative Skeleton: Multilevel disc degeneration and spurring throughout the cervical spine.  Prominent anterior spurring C3 through C6. No acute skeletal abnormality. Upper chest: Lung apices clear bilaterally Other: None IMPRESSION: The glottis is closed with suboptimal evaluation of the vocal cords. No asymmetry of the cords. Correlate with laryngoscopy results. No enlarged lymph nodes in the neck. 7 mm right level 2 lymph node may be reactive. No definite pathologic lymph nodes in the neck. Electronically Signed: By: Franchot Gallo M.D. On: 04/11/2019 21:07   Nm Pet Image Initial (pi) Skull Base To Thigh  Result Date: 04/22/2019 CLINICAL DATA:  Initial treatment strategy for laryngeal carcinoma. EXAM: NUCLEAR MEDICINE PET SKULL BASE TO THIGH TECHNIQUE: 6.7 mCi F-18 FDG was injected intravenously. Full-ring PET imaging was performed from the skull base to thigh after the radiotracer. CT data was obtained and used for attenuation correction and anatomic localization. Fasting blood glucose: 101 mg/dl COMPARISON:  None. FINDINGS: Mediastinal blood pool activity: SUV max Liver activity: SUV max renal cyst shift in NECK: Within the hypopharynx, there is mild asymmetric hypermetabolic activity associated with the RIGHT piriform sinus with SUV max equal 3.1. This activity is confined to the pharyngeal mucosa. There is activity related to the cricopharyngeal musculature is also greater on the RIGHT with SUV max equal 3.9. There are no hypermetabolic cervical lymph nodes. Mild activity associated with the proximal aspect the strap muscles on the LEFT and RIGHT. Incidental CT findings: none CHEST: No hypermetabolic mediastinal or hilar nodes. No suspicious pulmonary nodules on the CT scan. Incidental CT findings: none ABDOMEN/PELVIS: No abnormal hypermetabolic activity within the liver, pancreas, adrenal glands, or spleen. No hypermetabolic lymph nodes in the abdomen or pelvis. Incidental CT findings: none SKELETON: No focal hypermetabolic activity to suggest skeletal metastasis. Incidental CT findings: none  IMPRESSION:  1. Mild residual activity in the posterior RIGHT hypopharynx confined to the mucosa. 2. No evidence of hypermetabolic metastatic lymph nodes in LEFT or RIGHT neck. 3. No evidence distant metastatic disease. Electronically Signed   By: Suzy Bouchard M.D.   On: 04/22/2019 15:58    PATHOLOGY: I have reviewed the pathology reports as documented in the oncologist history.

## 2019-05-03 ENCOUNTER — Other Ambulatory Visit: Payer: Self-pay | Admitting: Radiation Oncology

## 2019-05-03 ENCOUNTER — Encounter: Payer: Self-pay | Admitting: Radiation Oncology

## 2019-05-03 ENCOUNTER — Telehealth: Payer: Self-pay | Admitting: *Deleted

## 2019-05-03 DIAGNOSIS — R634 Abnormal weight loss: Secondary | ICD-10-CM

## 2019-05-03 DIAGNOSIS — C321 Malignant neoplasm of supraglottis: Secondary | ICD-10-CM

## 2019-05-03 NOTE — Telephone Encounter (Signed)
Oncology Nurse Navigator Documentation  Spoke with Mr. Leh' caregiver Verdene Lennert, coordinated CT [facility] for this Friday 2:00.  She voiced understanding I will call her if IV contrast to be included in which case he will need to arrive 1:15.    Gayleen Orem, RN, BSN Head & Neck Oncology Nurse [place] at [place] [phone]

## 2019-05-04 ENCOUNTER — Encounter: Payer: Self-pay | Admitting: Hematology

## 2019-05-04 ENCOUNTER — Inpatient Hospital Stay: Payer: Medicare HMO

## 2019-05-04 ENCOUNTER — Encounter: Payer: Self-pay | Admitting: *Deleted

## 2019-05-04 ENCOUNTER — Inpatient Hospital Stay: Payer: Medicare HMO | Attending: Hematology | Admitting: Hematology

## 2019-05-04 ENCOUNTER — Other Ambulatory Visit: Payer: Self-pay

## 2019-05-04 VITALS — BP 142/94 | HR 68 | Temp 99.1°F | Resp 18 | Ht 68.0 in | Wt 139.1 lb

## 2019-05-04 DIAGNOSIS — F1010 Alcohol abuse, uncomplicated: Secondary | ICD-10-CM | POA: Diagnosis not present

## 2019-05-04 DIAGNOSIS — Z79899 Other long term (current) drug therapy: Secondary | ICD-10-CM | POA: Diagnosis not present

## 2019-05-04 DIAGNOSIS — F1910 Other psychoactive substance abuse, uncomplicated: Secondary | ICD-10-CM | POA: Diagnosis not present

## 2019-05-04 DIAGNOSIS — F17210 Nicotine dependence, cigarettes, uncomplicated: Secondary | ICD-10-CM | POA: Diagnosis not present

## 2019-05-04 DIAGNOSIS — C321 Malignant neoplasm of supraglottis: Secondary | ICD-10-CM | POA: Insufficient documentation

## 2019-05-04 DIAGNOSIS — E46 Unspecified protein-calorie malnutrition: Secondary | ICD-10-CM | POA: Insufficient documentation

## 2019-05-04 DIAGNOSIS — B1710 Acute hepatitis C without hepatic coma: Secondary | ICD-10-CM

## 2019-05-04 DIAGNOSIS — B1920 Unspecified viral hepatitis C without hepatic coma: Secondary | ICD-10-CM | POA: Insufficient documentation

## 2019-05-04 DIAGNOSIS — R634 Abnormal weight loss: Secondary | ICD-10-CM

## 2019-05-04 LAB — CMP (CANCER CENTER ONLY)
ALT: 61 U/L — ABNORMAL HIGH (ref 0–44)
AST: 60 U/L — ABNORMAL HIGH (ref 15–41)
Albumin: 3.5 g/dL (ref 3.5–5.0)
Alkaline Phosphatase: 54 U/L (ref 38–126)
Anion gap: 10 (ref 5–15)
BUN: 9 mg/dL (ref 8–23)
CO2: 26 mmol/L (ref 22–32)
Calcium: 9.2 mg/dL (ref 8.9–10.3)
Chloride: 105 mmol/L (ref 98–111)
Creatinine: 0.74 mg/dL (ref 0.61–1.24)
GFR, Est AFR Am: >60 mL/min (ref >60)
GFR, Estimated: >60 mL/min (ref >60)
Glucose, Bld: 94 mg/dL (ref 70–99)
Potassium: 4.3 mmol/L (ref 3.5–5.1)
Sodium: 141 mmol/L (ref 135–145)
Total Bilirubin: 0.5 mg/dL (ref 0.3–1.2)
Total Protein: 7.6 g/dL (ref 6.5–8.1)

## 2019-05-04 LAB — CBC WITH DIFFERENTIAL (CANCER CENTER ONLY)
Abs Immature Granulocytes: 0.01 10*3/uL (ref 0.00–0.07)
Basophils Absolute: 0 10*3/uL (ref 0.0–0.1)
Basophils Relative: 1 %
Eosinophils Absolute: 0.1 10*3/uL (ref 0.0–0.5)
Eosinophils Relative: 3 %
HCT: 42.7 % (ref 39.0–52.0)
Hemoglobin: 14 g/dL (ref 13.0–17.0)
Immature Granulocytes: 0 %
Lymphocytes Relative: 55 %
Lymphs Abs: 2.1 10*3/uL (ref 0.7–4.0)
MCH: 32.2 pg (ref 26.0–34.0)
MCHC: 32.8 g/dL (ref 30.0–36.0)
MCV: 98.2 fL (ref 80.0–100.0)
Monocytes Absolute: 0.6 10*3/uL (ref 0.1–1.0)
Monocytes Relative: 15 %
Neutro Abs: 1 10*3/uL — ABNORMAL LOW (ref 1.7–7.7)
Neutrophils Relative %: 26 %
Platelet Count: 152 10*3/uL (ref 150–400)
RBC: 4.35 MIL/uL (ref 4.22–5.81)
RDW: 12.7 % (ref 11.5–15.5)
WBC Count: 3.8 10*3/uL — ABNORMAL LOW (ref 4.0–10.5)
nRBC: 0 % (ref 0.0–0.2)

## 2019-05-04 LAB — TSH: TSH: 0.64 u[IU]/mL (ref 0.320–4.118)

## 2019-05-04 LAB — T4, FREE: Free T4: 0.74 ng/dL (ref 0.61–1.12)

## 2019-05-06 ENCOUNTER — Ambulatory Visit: Admission: RE | Admit: 2019-05-06 | Payer: Medicare HMO | Source: Ambulatory Visit

## 2019-05-06 ENCOUNTER — Ambulatory Visit
Admission: RE | Admit: 2019-05-06 | Discharge: 2019-05-06 | Disposition: A | Discharge status: Home / Self Care | Payer: Medicare HMO | Source: Ambulatory Visit | Attending: Radiation Oncology | Admitting: Radiation Oncology

## 2019-05-06 ENCOUNTER — Telehealth: Payer: Self-pay | Admitting: *Deleted

## 2019-05-06 ENCOUNTER — Other Ambulatory Visit: Payer: Self-pay

## 2019-05-06 ENCOUNTER — Encounter: Payer: Self-pay | Admitting: *Deleted

## 2019-05-06 DIAGNOSIS — C321 Malignant neoplasm of supraglottis: Secondary | ICD-10-CM

## 2019-05-06 DIAGNOSIS — Z510 Encounter for antineoplastic radiation therapy: Secondary | ICD-10-CM | POA: Diagnosis not present

## 2019-05-06 NOTE — Telephone Encounter (Signed)
Oncology Nurse Navigator Documentation  Spoke with friend/transporter Verdene Lennert to coordinate Mr. Brookman' attendance at next Tuesday morning's H&[facility] [place] to meet with SLP Garald Balding at 9:15.  Provided arrival/registration instructions.  She voiced understanding.  Gayleen Orem, RN, BSN Head & Neck Oncology Nurse [place] at [place] [phone]'

## 2019-05-06 NOTE — Progress Notes (Signed)
Oncology Nurse Navigator Documentation  To provide support, encouragement and care continuity, met with Mr. Woodcox during his CT SIM. He was unaccompanied d/t [facility] COVID safety practices.   He tolerated procedure without difficulty, denied questions/concerns.   Toured him to [facility] 4 treatment area, explained procedures for lobby registration, arrival to Radiation Waiting, arrival to tmt area and preparation for tmt.  He voiced understanding.   Provided Epic appt calendar with upcoming appts including next Tuesday's [facility]. I encouraged him to call me with question prior to next [place] or Thursday's RT [place].   He voiced understanding of information provided.  Gayleen Orem, RN, BSN Head & Neck Oncology Nurse [place] at [place] [phone]

## 2019-05-07 NOTE — Progress Notes (Signed)
Oncology Nurse Navigator Documentation  Met with patient during initial consult with Dr. Maylon Peppers.  He voiced understanding after repeated explanation reasons he is not a candidate for systemic tmt, recommendation to proceed with XRT. Following appt with Dr. Maylon Peppers, we met in vacant [place] Exam room for further education.  Provided New Patient Information packet: o Contact information for physician, this navigator, other members of the Care Team o Advance Directive information ([place] blue pamphlet with LCSW insert). o Fall Prevention Patient Safety Plan o Financial Assistance Information sheet o Symptom Management Clinic information o [place] with highlight of [place] . Provided and discussed educational handout for PEG should Dr. Isidore Moos recommend.  Showed him example. . Showed him example of Aquaplast mask, again explained purpose. He voiced understanding of information provided. I encouraged him to call with questions/concerns as he proceeds with future appts/procedures.  He agreed to do so.  Gayleen Orem, RN, BSN Head & Neck Oncology Nurse [place] at [place] [phone]

## 2019-05-09 ENCOUNTER — Ambulatory Visit (INDEPENDENT_AMBULATORY_CARE_PROVIDER_SITE_OTHER): Payer: Medicare HMO | Admitting: Gastroenterology

## 2019-05-09 DIAGNOSIS — Z23 Encounter for immunization: Secondary | ICD-10-CM

## 2019-05-09 NOTE — Progress Notes (Signed)
Head and Neck Cancer Simulation, IMRT treatment planning note   Outpatient  Diagnosis:    ICD-10-CM   1. 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HCC)  C32.1     The patient was taken to the CT simulator and identity was confirmed.  All relevant records and images related to the planned course of therapy were reviewed.  The patient freely provided informed written consent to proceed with treatment after reviewing the details related to the planned course of therapy. The consent form was witnessed and verified by the simulation staff.    The patient was laid in the supine position on the table. An Aquaplast head and shoulder mask was custom fitted to the patient's anatomy. High-resolution CT axial imaging was obtained of the head and neck with contrast. I verified that the quality of the imaging is good for treatment planning. 1 Medically Necessary Treatment Device was fabricated and supervised by me: Aquaplast mask.  Treatment planning note I plan to treat the patient with IMRT. I plan to treat the patient's tumor and bilateral neck nodes. I plan to treat to a total dose of 70 Gray in 35  fractions. Dose calculation was ordered from dosimetry.  IMRT planning Note  IMRT is medically necessary and an important modality to deliver adequate dose to the patient's at risk tissues while sparing the patient's normal structures, including the: esophagus, parotid tissue, mandible, brain stem, spinal cord, oral cavity, brachial plexus.  This justifies the use of IMRT in the patient's treatment.   -----------------------------------  [name], MD

## 2019-05-10 ENCOUNTER — Other Ambulatory Visit: Payer: Self-pay

## 2019-05-10 ENCOUNTER — Encounter: Payer: Self-pay | Admitting: *Deleted

## 2019-05-10 ENCOUNTER — Ambulatory Visit: Payer: Medicare HMO | Attending: Radiation Oncology

## 2019-05-10 DIAGNOSIS — R1310 Dysphagia, unspecified: Secondary | ICD-10-CM | POA: Insufficient documentation

## 2019-05-11 ENCOUNTER — Telehealth: Payer: Self-pay | Admitting: *Deleted

## 2019-05-11 DIAGNOSIS — Z510 Encounter for antineoplastic radiation therapy: Secondary | ICD-10-CM | POA: Diagnosis not present

## 2019-05-11 NOTE — Telephone Encounter (Signed)
[place] Work  Clinical Social Work was referred by  Pension scheme manager for assessment of psychosocial needs.  Clinical Social Worker contacted caregiver by phone  to offer support and assess for needs.  Patient's significant other, shared patient lives with his sister but usually leaves her home during the day.  The next time she sees patient, they will call CSW to follow up.      Gwinda Maine, LCSW  Clinical Social Worker [facility]

## 2019-05-11 NOTE — Patient Instructions (Signed)
SWALLOWING EXERCISES Do these daily until 6 months after your last day of radiation, then 2 times per week afterwards  1. Effortful Swallows - Press your tongue against the roof of your mouth for 3 seconds, then squeeze          the muscles in your neck while you swallow your saliva or a sip of water - Repeat 10-15 times, 2-3 times a day, and use whenever you eat or drink  2. Masako Swallow - swallow with your tongue sticking out - Stick tongue out past your teeth and gently bite tongue with your teeth - Swallow, while holding your tongue with your teeth - Repeat 10-15 times, 2-3 times a day *use a wet spoon if your mouth gets dry*  3. Mendelsohn Maneuver - "half swallow" exercise - Start to swallow, and keep your Adam's apple up by squeezing hard with the            muscles of the throat - Hold the squeeze for 5-7 seconds and then relax - Repeat 10-15 times, 2-3 times a day *use a wet spoon if your mouth gets dry*  4. Chin pushback - Open your mouth  - Place your fist UNDER your chin near your neck, and push back with your fist for 5 seconds - Repeat 10 times, 2-3 times a day        5.  Super Swallow  -Take a breath and hold it.  - Bear down  - Swallow and immediately cough  -repeat 10 times, twice a day

## 2019-05-11 NOTE — Therapy (Signed)
[facility] [address] [place], [place], [address] Phone: [phone]   Fax:  [phone]  Speech Language Pathology Treatment  Patient Details  Name: [name] MRN: [id number] Date of Birth: 1951/06/25 Referring Provider (SLP): [name], MD   Encounter Date: 05/10/2019  End of Session - 05/11/19 1300    Visit Number  1    Number of Visits  4    Date for SLP Re-Evaluation  08/05/19    SLP Start Time  0930    SLP Stop Time   1010    SLP Time Calculation (min)  40 min    Activity Tolerance  Patient tolerated treatment well       Past Medical History:  Diagnosis Date  . Diabetes mellitus without complication (HCC)    no meds  . ETOH abuse   . Frequent urination   . Glaucoma   . Hepatitis C virus infection without hepatic coma    dx'ed in 11/2018  . Wears denture    upper only; lost lower denture    Past Surgical History:  Procedure Laterality Date  . ANKLE SURGERY  2011   right ankle  . COLONOSCOPY  02/2019   polyps - Dr Havery Moros  . DIRECT LARYNGOSCOPY N/A 04/01/2019   Procedure: DIRECT LARYNGOSCOPY WITH BIOPSY;  Surgeon: Jodi Marble, MD;  Location: [facility];  Service: ENT;  Laterality: N/A;  . ESOPHAGOSCOPY N/A 04/01/2019   Procedure: ESOPHAGOSCOPY;  Surgeon: Jodi Marble, MD;  Location: [facility];  Service: ENT;  Laterality: N/A;  . EYE SURGERY Right   . KNEE SURGERY    . LARYNGOSCOPY AND BRONCHOSCOPY N/A 04/01/2019   Procedure: BRONCHOSCOPY;  Surgeon: Jodi Marble, MD;  Location: [facility];  Service: ENT;  Laterality: N/A;    There were no vitals filed for this visit.  Subjective Assessment - 05/10/19 0932    Subjective  "I'm eating regular food."    Currently in Pain?  No/denies        SLP Evaluation [facility] - 05/11/19 0001      SLP Visit Information   SLP Received On  05/10/19    Referring Provider (SLP)  [name], MD    Onset Date  "3-4 months ago"    Medical Diagnosis   Supraglottic SCCA      Subjective   Patient/Family Stated Goal  Have normal swallowing after radiation is over      General Information   HPI  PT with rt supraglottic SCCA. Pt will not receive chemo, but begins rad tx on 05-12-19.      Prior Functional Status   Cognitive/Linguistic Baseline  Within functional limits      Auditory Comprehension   Overall Auditory Comprehension  Appears within functional limits for tasks assessed      Verbal Expression   Overall Verbal Expression  Appears within functional limits for tasks assessed      Oral Motor/Sensory Function   Overall Oral Motor/Sensory Function  Other (comment)   deferred due to masking policy and risk of [id number] exposure     Motor Speech   Overall Motor Speech  Appears within functional limits for tasks assessed       Pt currently tolerates regular diet and thin liquids.Today, SLP observed pt with water without overt s/s aspiration PNA.  Pt's swallow deemed essentially WNL at this time.   Because data states the risk for dysphagia during and after radiation treatment is high due to undergoing radiation tx, SLP  taught pt about the possibility of reduced/limited ability for PO intake during rad tx. SLP encouraged pt to continue swallowing POs as far into rad tx as possible, even ingesting POs and/or completing HEP shortly after administration of pain meds.   SLP educated pt re: changes to swallowing musculature after rad tx, and why adherence to dysphagia HEP provided today and PO consumption was necessary to inhibit muscular fibrosis following rad tx. Pt demonstrated understanding of these things to SLP.    SLP then developed a HEP for pt and pt was instructed how to perform exercises involving lingual, vocal, and pharyngeal strengthening. SLP performed each exercise and pt return demonstrated each exercise. SLP ensured pt performance was correct prior to moving on to next exercise. Pt was instructed to complete this program 2  times a day  until 6 months after his or her last rad tx, then x2 a week after that.     SLP Education - 05/11/19 1258    Education Details  HEP procedure and rationale, late effects head and neck radiation on swalloiwng    Person(s) Educated  Patient    Methods  Explanation;Demonstration;Verbal cues;Handout    Comprehension  Verbalized understanding;Returned demonstration;Verbal cues required;Need further instruction       SLP Short Term Goals - 05/11/19 1305      SLP SHORT TERM GOAL #1   Title  pt will demo understanding of correct procedure for HEP    Time  2    Period  --   visits (visit #3), for all STGs   Status  New      SLP SHORT TERM GOAL #2   Title  pt will tell SLP why he is completing HEP    Time  1    Period  --   visits   Status  New       SLP Long Term Goals - 05/11/19 1306      SLP LONG TERM GOAL #1   Title  pt will demo understanding of proper procedure for HEP over two sessions    Time  3    Period  --   visits, for all LTGs (visit #4)   Status  New      SLP LONG TERM GOAL #2   Title  pt will tell SLP why a food journal can expediate return to diet most like pre-cancer diet    Time  3    Status  New       Plan - 05/11/19 1300    Clinical Impression Statement  Pt presents today with swalloiwng essentialy WNL for water, reportedly WNL for regular foods. The probability for incr'd dysphagia during and after radiation therapy increases as pt progresses through his rad tx, and skilled ST is recommended to regularly assess pt's safety with POs as well as assess pt's procedure with prescribed HEP. Visits will take place via virtual session or in-person sessions.    Speech Therapy Frequency  --   once every approx 4 weeks   Duration  --   4 total visits   Treatment/Interventions  Aspiration precaution training;Pharyngeal strengthening exercises;Diet toleration management by SLP;Internal/external aids;Patient/family education;Trials of upgraded  texture/liquids;SLP instruction and feedback    Potential to Achieve Goals  Good    SLP Home Exercise Plan  provided today    Consulted and Agree with Plan of Care  Patient       Patient will benefit from skilled therapeutic intervention in order to improve the following deficits and  impairments:   Dysphagia, unspecified type    Problem List Patient Active Problem List   Diagnosis Date Noted  . 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 Squamous cell carcinoma of neck 04/01/2019    [facility] ,MS, CCC-SLP  05/11/2019, 1:08 PM  [facility] [address] [place], [place], [address] Phone: [phone]   Fax:  [phone]   Name: GABOR GAUNA MRN: [id number] Date of Birth: 13-Apr-1951

## 2019-05-12 ENCOUNTER — Other Ambulatory Visit: Payer: Self-pay

## 2019-05-12 ENCOUNTER — Ambulatory Visit
Admission: RE | Admit: 2019-05-12 | Discharge: 2019-05-12 | Disposition: A | Discharge status: Home / Self Care | Payer: Medicare HMO | Source: Ambulatory Visit | Attending: Radiation Oncology | Admitting: Radiation Oncology

## 2019-05-12 ENCOUNTER — Inpatient Hospital Stay: Payer: Medicare HMO | Admitting: Nutrition

## 2019-05-12 DIAGNOSIS — Z510 Encounter for antineoplastic radiation therapy: Secondary | ICD-10-CM | POA: Diagnosis not present

## 2019-05-12 NOTE — Progress Notes (Signed)
68 year old male diagnosed with Supraglottis cancer receiving radiation treatments.  Past medical history includes polysubstance abuse including alcohol and cocaine, hepatitis C, diabetes, and upper dentures.  Medications include lidocaine.  Labs were reviewed.  Height: 5 feet 8 inches. Weight: 139.1 pounds on August 19. Usual body weight: 149 pounds per patient.  144 pounds documented June 20. Current BMI 21.15.  I spoke with patient and his friend [place] with his permission by telephone. Patient lives with his sister and she does most of the cooking. Reports he generally eats cereal and drinks Ensure for breakfast, he eats noodles and broth, ice cream, and pie at lunchtime, and generally what ever his sister makes for dinner. He does enjoy Ensure and is currently drinking original chocolate Ensure. Patient denies other nutrition impact symptoms. Weight loss is not significant however with patient's substance abuse expect patient has some degree of malnutrition.  Nutrition diagnosis: Food and nutrition related knowledge deficit related to supraglottic cancer as evidenced by no prior need for nutrition related information.  Intervention: Patient educated to include protein 6 times a day at mealtimes and snack times. Recommended increasing Ensure to Ensure Plus or Ensure Enlive and consuming 3 times daily. I will leave samples for patient to try and have offered him a complementary case of vanilla Ensure Enlive. Will provide fact sheets on increasing calories and protein and protein foods. Questions were answered and teach back method used.  Contact information given.  Monitoring, evaluation, goals: Patient will tolerate increased calories and protein to minimize weight loss throughout treatment.  Next visit: Tuesday, September 15 by phone.  **Disclaimer: This note was dictated with voice recognition software. Similar sounding words can inadvertently be transcribed and this note  may contain transcription errors which may not have been corrected upon publication of note.**

## 2019-05-13 ENCOUNTER — Ambulatory Visit
Admission: RE | Admit: 2019-05-13 | Discharge: 2019-05-13 | Disposition: A | Discharge status: Home / Self Care | Payer: Medicare HMO | Source: Ambulatory Visit | Attending: Radiation Oncology | Admitting: Radiation Oncology

## 2019-05-13 ENCOUNTER — Other Ambulatory Visit: Payer: Self-pay

## 2019-05-13 DIAGNOSIS — Z510 Encounter for antineoplastic radiation therapy: Secondary | ICD-10-CM | POA: Diagnosis not present

## 2019-05-14 NOTE — Progress Notes (Signed)
Oncology Nurse Navigator Documentation   Met with [name] upon his arrival for [facility], provided verbal overview.  He was seen by SLP Garald Balding.  Spoke with him at end of [facility], addressed questions. I encouraged him to call me with needs/concerns.  Gayleen Orem, RN, BSN Head & Neck Oncology [place] at [place] [phone]

## 2019-05-16 ENCOUNTER — Ambulatory Visit
Admission: RE | Admit: 2019-05-16 | Discharge: 2019-05-16 | Disposition: A | Discharge status: Home / Self Care | Payer: Medicare HMO | Source: Ambulatory Visit | Attending: Radiation Oncology | Admitting: Radiation Oncology

## 2019-05-16 ENCOUNTER — Other Ambulatory Visit: Payer: Self-pay | Admitting: Radiation Oncology

## 2019-05-16 ENCOUNTER — Other Ambulatory Visit: Payer: Self-pay

## 2019-05-16 DIAGNOSIS — Z510 Encounter for antineoplastic radiation therapy: Secondary | ICD-10-CM | POA: Diagnosis not present

## 2019-05-16 DIAGNOSIS — C4442 Squamous cell carcinoma of skin of scalp and neck: Secondary | ICD-10-CM

## 2019-05-16 MED ORDER — SONAFINE EX EMUL
1.0000 "application " | Freq: Two times a day (BID) | CUTANEOUS | Status: DC
  Administered 2019-05-16: 1 via TOPICAL

## 2019-05-17 ENCOUNTER — Ambulatory Visit
Admission: RE | Admit: 2019-05-17 | Discharge: 2019-05-17 | Disposition: A | Discharge status: Home / Self Care | Payer: Medicare HMO | Source: Ambulatory Visit | Attending: Radiation Oncology | Admitting: Radiation Oncology

## 2019-05-17 ENCOUNTER — Other Ambulatory Visit: Payer: Self-pay

## 2019-05-17 ENCOUNTER — Ambulatory Visit: Payer: Medicare HMO | Admitting: Radiation Oncology

## 2019-05-17 DIAGNOSIS — C321 Malignant neoplasm of supraglottis: Secondary | ICD-10-CM | POA: Insufficient documentation

## 2019-05-17 DIAGNOSIS — Z510 Encounter for antineoplastic radiation therapy: Secondary | ICD-10-CM | POA: Diagnosis present

## 2019-05-18 ENCOUNTER — Ambulatory Visit
Admission: RE | Admit: 2019-05-18 | Discharge: 2019-05-18 | Disposition: A | Discharge status: Home / Self Care | Payer: Medicare HMO | Source: Ambulatory Visit | Attending: Radiation Oncology | Admitting: Radiation Oncology

## 2019-05-18 ENCOUNTER — Other Ambulatory Visit: Payer: Self-pay

## 2019-05-18 DIAGNOSIS — Z510 Encounter for antineoplastic radiation therapy: Secondary | ICD-10-CM | POA: Diagnosis not present

## 2019-05-19 ENCOUNTER — Ambulatory Visit: Payer: Medicare HMO

## 2019-05-20 ENCOUNTER — Ambulatory Visit: Payer: Medicare HMO

## 2019-05-23 ENCOUNTER — Ambulatory Visit: Payer: Medicare HMO

## 2019-05-24 ENCOUNTER — Other Ambulatory Visit: Payer: Self-pay | Admitting: Radiation Oncology

## 2019-05-24 ENCOUNTER — Ambulatory Visit
Admission: RE | Admit: 2019-05-24 | Discharge: 2019-05-24 | Disposition: A | Discharge status: Home / Self Care | Payer: Medicare HMO | Source: Ambulatory Visit | Attending: Radiation Oncology | Admitting: Radiation Oncology

## 2019-05-24 ENCOUNTER — Other Ambulatory Visit: Payer: Self-pay

## 2019-05-24 DIAGNOSIS — C321 Malignant neoplasm of supraglottis: Secondary | ICD-10-CM

## 2019-05-24 DIAGNOSIS — Z510 Encounter for antineoplastic radiation therapy: Secondary | ICD-10-CM | POA: Diagnosis not present

## 2019-05-24 MED ORDER — LIDOCAINE VISCOUS HCL 2 % MT SOLN: OROMUCOSAL | 5 refills | Status: DC

## 2019-05-25 ENCOUNTER — Ambulatory Visit
Admission: RE | Admit: 2019-05-25 | Discharge: 2019-05-25 | Disposition: A | Discharge status: Home / Self Care | Payer: Medicare HMO | Source: Ambulatory Visit | Attending: Radiation Oncology | Admitting: Radiation Oncology

## 2019-05-25 ENCOUNTER — Other Ambulatory Visit: Payer: Self-pay

## 2019-05-25 DIAGNOSIS — Z510 Encounter for antineoplastic radiation therapy: Secondary | ICD-10-CM | POA: Diagnosis not present

## 2019-05-26 ENCOUNTER — Other Ambulatory Visit: Payer: Self-pay

## 2019-05-26 ENCOUNTER — Ambulatory Visit
Admission: RE | Admit: 2019-05-26 | Discharge: 2019-05-26 | Disposition: A | Discharge status: Home / Self Care | Payer: Medicare HMO | Source: Ambulatory Visit | Attending: Radiation Oncology | Admitting: Radiation Oncology

## 2019-05-26 DIAGNOSIS — Z510 Encounter for antineoplastic radiation therapy: Secondary | ICD-10-CM | POA: Diagnosis not present

## 2019-05-27 ENCOUNTER — Ambulatory Visit
Admission: RE | Admit: 2019-05-27 | Discharge: 2019-05-27 | Disposition: A | Discharge status: Home / Self Care | Payer: Medicare HMO | Source: Ambulatory Visit | Attending: Radiation Oncology | Admitting: Radiation Oncology

## 2019-05-27 ENCOUNTER — Other Ambulatory Visit: Payer: Self-pay

## 2019-05-27 DIAGNOSIS — Z510 Encounter for antineoplastic radiation therapy: Secondary | ICD-10-CM | POA: Diagnosis not present

## 2019-05-28 ENCOUNTER — Ambulatory Visit: Payer: Medicare HMO

## 2019-05-30 ENCOUNTER — Ambulatory Visit: Payer: Medicare HMO

## 2019-05-31 ENCOUNTER — Ambulatory Visit
Admission: RE | Admit: 2019-05-31 | Discharge: 2019-05-31 | Disposition: A | Discharge status: Home / Self Care | Payer: Medicare HMO | Source: Ambulatory Visit | Attending: Radiation Oncology | Admitting: Radiation Oncology

## 2019-05-31 ENCOUNTER — Inpatient Hospital Stay: Payer: Medicare HMO | Attending: Hematology | Admitting: Nutrition

## 2019-05-31 ENCOUNTER — Other Ambulatory Visit: Payer: Self-pay

## 2019-05-31 DIAGNOSIS — Z510 Encounter for antineoplastic radiation therapy: Secondary | ICD-10-CM | POA: Diagnosis not present

## 2019-05-31 NOTE — Progress Notes (Signed)
Nutrition follow-up completed with patient and Verdene Lennert over the telephone. Patient receiving radiation therapy for supraglottis cancer. Patient reports his weight was 139 pounds when he met with his physician this week.  This is stable from 139.1 pounds August 19. Patient denies any nutrition issues at this time. He reports his physician gave him medication in case his swallowing becomes more painful.  He generally understands how to mix and take this medication. Reports he has enjoyed drinking Ensure and even made some milkshakes with it.  Nutrition diagnosis: Food and nutrition related knowledge deficit improved.  Intervention: Educated patient to continue strategies for adequate calorie and protein intake. Provided 1 complementary case of Ensure Enlive. Encourage patient to contact me if he has any questions.  Monitoring, evaluation, goals: Patient will tolerate adequate calories and protein for weight maintenance.  Next visit: Tuesday, September 22 by telephone.  **Disclaimer: This note was dictated with voice recognition software. Similar sounding words can inadvertently be transcribed and this note may contain transcription errors which may not have been corrected upon publication of note.**

## 2019-06-01 ENCOUNTER — Other Ambulatory Visit: Payer: Self-pay

## 2019-06-01 ENCOUNTER — Ambulatory Visit: Payer: Medicare HMO | Admitting: Infectious Diseases

## 2019-06-01 ENCOUNTER — Ambulatory Visit
Admission: RE | Admit: 2019-06-01 | Discharge: 2019-06-01 | Disposition: A | Discharge status: Home / Self Care | Payer: Medicare HMO | Source: Ambulatory Visit | Attending: Radiation Oncology | Admitting: Radiation Oncology

## 2019-06-01 DIAGNOSIS — Z510 Encounter for antineoplastic radiation therapy: Secondary | ICD-10-CM | POA: Diagnosis not present

## 2019-06-02 ENCOUNTER — Other Ambulatory Visit: Payer: Self-pay

## 2019-06-02 ENCOUNTER — Ambulatory Visit
Admission: RE | Admit: 2019-06-02 | Discharge: 2019-06-02 | Disposition: A | Discharge status: Home / Self Care | Payer: Medicare HMO | Source: Ambulatory Visit | Attending: Radiation Oncology | Admitting: Radiation Oncology

## 2019-06-02 DIAGNOSIS — Z510 Encounter for antineoplastic radiation therapy: Secondary | ICD-10-CM | POA: Diagnosis not present

## 2019-06-03 ENCOUNTER — Other Ambulatory Visit: Payer: Self-pay

## 2019-06-03 ENCOUNTER — Ambulatory Visit
Admission: RE | Admit: 2019-06-03 | Discharge: 2019-06-03 | Disposition: A | Discharge status: Home / Self Care | Payer: Medicare HMO | Source: Ambulatory Visit | Attending: Radiation Oncology | Admitting: Radiation Oncology

## 2019-06-03 DIAGNOSIS — Z510 Encounter for antineoplastic radiation therapy: Secondary | ICD-10-CM | POA: Diagnosis not present

## 2019-06-04 ENCOUNTER — Ambulatory Visit: Payer: Medicare HMO

## 2019-06-06 ENCOUNTER — Encounter: Payer: Self-pay | Admitting: *Deleted

## 2019-06-06 ENCOUNTER — Ambulatory Visit
Admission: RE | Admit: 2019-06-06 | Discharge: 2019-06-06 | Disposition: A | Discharge status: Home / Self Care | Payer: Medicare HMO | Source: Ambulatory Visit | Attending: Radiation Oncology | Admitting: Radiation Oncology

## 2019-06-06 ENCOUNTER — Ambulatory Visit: Payer: Medicare HMO | Attending: Radiation Oncology

## 2019-06-06 ENCOUNTER — Other Ambulatory Visit: Payer: Self-pay

## 2019-06-06 DIAGNOSIS — Z510 Encounter for antineoplastic radiation therapy: Secondary | ICD-10-CM | POA: Diagnosis not present

## 2019-06-06 DIAGNOSIS — R1310 Dysphagia, unspecified: Secondary | ICD-10-CM

## 2019-06-06 NOTE — Therapy (Signed)
[facility] [address] [place], [place], [address] Phone: [phone]   Fax:  [phone]  Speech Language Pathology Treatment  Patient Details  Name: ADITHYA DIFRANCESCO MRN: [id number] Date of Birth: September 23, 1950 Referring Provider (SLP): [name], MD   Encounter Date: 06/06/2019  End of Session - 06/06/19 1417    Visit Number  2    Number of Visits  4    Date for SLP Re-Evaluation  08/05/19    SLP Start Time  1330    SLP Stop Time   1350    SLP Time Calculation (min)  20 min    Activity Tolerance  Patient tolerated treatment well       Past Medical History:  Diagnosis Date  . Diabetes mellitus without complication (HCC)    no meds  . ETOH abuse   . Frequent urination   . Glaucoma   . Hepatitis C virus infection without hepatic coma    dx'ed in 11/2018  . Wears denture    upper only; lost lower denture    Past Surgical History:  Procedure Laterality Date  . ANKLE SURGERY  2011   right ankle  . COLONOSCOPY  02/2019   polyps - Dr Havery Moros  . DIRECT LARYNGOSCOPY N/A 04/01/2019   Procedure: DIRECT LARYNGOSCOPY WITH BIOPSY;  Surgeon: Jodi Marble, MD;  Location: [facility];  Service: ENT;  Laterality: N/A;  . ESOPHAGOSCOPY N/A 04/01/2019   Procedure: ESOPHAGOSCOPY;  Surgeon: Jodi Marble, MD;  Location: [facility];  Service: ENT;  Laterality: N/A;  . EYE SURGERY Right   . KNEE SURGERY    . LARYNGOSCOPY AND BRONCHOSCOPY N/A 04/01/2019   Procedure: BRONCHOSCOPY;  Surgeon: Jodi Marble, MD;  Location: [facility];  Service: ENT;  Laterality: N/A;    There were no vitals filed for this visit.  Subjective Assessment - 06/06/19 1351    Subjective  Pt reports discomfort eating tougher meats.    Currently in Pain?  No/denies            ADULT SLP TREATMENT - 06/06/19 1351      General Information   Behavior/Cognition  Alert;Cooperative;Pleasant mood      Treatment Provided   Treatment provided  Dysphagia      Dysphagia Treatment   Temperature Spikes Noted  No    Treatment Methods  Patient/caregiver education    Other treatment/comments  Pt told SLP he was performing the exercises provided to him and named one of them when prompted to do so. He was asked to describe the foods he was able to eat at this time and it appears as though he is gravitating to softer foods. SLP told pt to stay in touch with Dory Peru, as well as need for SLP to see him next month, however pt's radiation schedule does not facilitate a Thursday visit (SLP with PM times only, pt's rad times are morning) at [facility] so SLP scheduled another telephone visit for mid- October with likely in-person follow up in November at [facility]. Pt req'd mod cues for rationale for HEP.      Cognitive-Linquistic Treatment   Treatment focused on  --      Assessment / Recommendations / Plan   Plan  Continue with current plan of care      Dysphagia Recommendations   Diet recommendations  --   as tolerated   Liquids provided via  [facility]    Medication Administration  --   as tolerated  Progression Toward Goals   Progression toward goals  Progressing toward goals       SLP Education - 06/06/19 1416    Education Details  pt will likely need to eat softer foods in next 4 weeks    Person(s) Educated  Patient    Methods  Explanation    Comprehension  Verbalized understanding       SLP Short Term Goals - 06/06/19 1418      SLP SHORT TERM GOAL #1   Title  pt will demo understanding of correct procedure for HEP    Time  1    Period  --   visits (visit #3), for all STGs   Status  On-going      SLP SHORT TERM GOAL #2   Title  pt will tell SLP why he is completing HEP    Status  Partially Met       SLP Long Term Goals - 06/06/19 1419      SLP LONG TERM GOAL #1   Title  pt will demo understanding of proper procedure for HEP over two sessions    Time  2    Period  --   visits, for all LTGs (visit #4)   Status  On-going      SLP LONG  TERM GOAL #2   Title  pt will tell SLP why a food journal can expediate return to diet most like pre-cancer diet    Time  2    Status  On-going       Plan - 06/06/19 1417    Clinical Impression Statement  Pt presents today with swalloiwng ability reportedly WNL/WFL for softer solids and for water. Discomfort is reported with tougher foods. The probability for incr'd dysphagia during and after radiation therapy increases as pt progresses through his rad tx, and skilled ST is recommended to regularly assess pt's safety with POs as well as assess pt's procedure with prescribed HEP. Visits will take place via virtual session or in-person sessions.    Speech Therapy Frequency  --   once every approx 4 weeks   Duration  --   4 total visits   Treatment/Interventions  Aspiration precaution training;Pharyngeal strengthening exercises;Diet toleration management by SLP;Internal/external aids;Patient/family education;Trials of upgraded texture/liquids;SLP instruction and feedback    Potential to Achieve Goals  Good    SLP Home Exercise Plan  provided today    Consulted and Agree with Plan of Care  Patient       Patient will benefit from skilled therapeutic intervention in order to improve the following deficits and impairments:   Dysphagia, unspecified type    Problem List Patient Active Problem List   Diagnosis Date Noted  . 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 Squamous cell carcinoma of neck 04/01/2019    [facility] ,MS, CCC-SLP  06/06/2019, 2:19 PM  [facility] [address] [place], [place], [address] Phone: [phone]   Fax:  [phone]   Name: HARLIN MAZZONI MRN: [id number] Date of Birth: [birth year]

## 2019-06-06 NOTE — Progress Notes (Addendum)
Oncology Nurse Navigator Documentation  Met with [name] s/p weekly PUT with Dr. Isidore Moos: Provided Epic calendar of appointments, noted today's appt with SLP [name] and tomorrow's with Nutrition [name] are via phone.   Encouraged:  BID application of Sonafine after morning RT and at bedtime.  BID compliance with swallowing exercises per SLP [name]'s instruction. He voiced understanding he can call me with needs/concerns.  Gayleen Orem, RN, BSN Head & Neck Oncology Nurse [place] at [place] [phone]

## 2019-06-07 ENCOUNTER — Other Ambulatory Visit: Payer: Self-pay

## 2019-06-07 ENCOUNTER — Inpatient Hospital Stay: Payer: Medicare HMO | Admitting: Nutrition

## 2019-06-07 ENCOUNTER — Ambulatory Visit
Admission: RE | Admit: 2019-06-07 | Discharge: 2019-06-07 | Disposition: A | Discharge status: Home / Self Care | Payer: Medicare HMO | Source: Ambulatory Visit | Attending: Radiation Oncology | Admitting: Radiation Oncology

## 2019-06-07 DIAGNOSIS — Z510 Encounter for antineoplastic radiation therapy: Secondary | ICD-10-CM | POA: Diagnosis not present

## 2019-06-07 NOTE — Progress Notes (Signed)
Spoke with Verdene Lennert, patient's caregiver, and patient over the telephone regarding patient's oral intake. Patient reports he is eating a combination of soft foods and milkshakes made with Ensure Enlive. Patient denies nutrition impact symptoms other than some difficulty swallowing. Reports his weight was up to "140 something."  He seemed pleased.  Nutrition diagnosis: Food and nutrition related knowledge deficit improved.  Intervention: Provided support and encouragement for patient to continue strategies for increased calories and protein. Recommended patient continue Ensure Enlive as needed. Provided second complementary case of Ensure Enlive. Patient understands to contact me with questions.  Monitoring, evaluation, goals: Patient will tolerate increased calories and protein to minimize weight loss.  Next visit: Tuesday, September 29 over the phone.  **Disclaimer: This note was dictated with voice recognition software. Similar sounding words can inadvertently be transcribed and this note may contain transcription errors which may not have been corrected upon publication of note.**

## 2019-06-08 ENCOUNTER — Ambulatory Visit
Admission: RE | Admit: 2019-06-08 | Discharge: 2019-06-08 | Disposition: A | Discharge status: Home / Self Care | Payer: Medicare HMO | Source: Ambulatory Visit | Attending: Radiation Oncology | Admitting: Radiation Oncology

## 2019-06-08 ENCOUNTER — Other Ambulatory Visit: Payer: Self-pay

## 2019-06-08 DIAGNOSIS — Z510 Encounter for antineoplastic radiation therapy: Secondary | ICD-10-CM | POA: Diagnosis not present

## 2019-06-09 ENCOUNTER — Ambulatory Visit (INDEPENDENT_AMBULATORY_CARE_PROVIDER_SITE_OTHER): Payer: Medicare HMO | Admitting: Infectious Diseases

## 2019-06-09 ENCOUNTER — Encounter: Payer: Self-pay | Admitting: Infectious Diseases

## 2019-06-09 ENCOUNTER — Other Ambulatory Visit: Payer: Self-pay

## 2019-06-09 ENCOUNTER — Ambulatory Visit
Admission: RE | Admit: 2019-06-09 | Discharge: 2019-06-09 | Disposition: A | Discharge status: Home / Self Care | Payer: Medicare HMO | Source: Ambulatory Visit | Attending: Radiation Oncology | Admitting: Radiation Oncology

## 2019-06-09 VITALS — BP 118/80 | HR 77 | Temp 98.7°F | Resp 98

## 2019-06-09 DIAGNOSIS — Z510 Encounter for antineoplastic radiation therapy: Secondary | ICD-10-CM | POA: Diagnosis not present

## 2019-06-09 DIAGNOSIS — C760 Malignant neoplasm of head, face and neck: Secondary | ICD-10-CM

## 2019-06-09 DIAGNOSIS — B182 Chronic viral hepatitis C: Secondary | ICD-10-CM | POA: Insufficient documentation

## 2019-06-09 DIAGNOSIS — F1010 Alcohol abuse, uncomplicated: Secondary | ICD-10-CM | POA: Diagnosis not present

## 2019-06-09 NOTE — Progress Notes (Addendum)
SARIM PROPER  [id number]  Feb 19, 1951    HPI: The patient is a 68 y.o. y/o AA male who presents today for a routine return visit for HCV. He was diagnosed in 11/2018 by [facility] when he presented for an evaluation for elevated LFTs in the setting of alcohol abuse and a brother who was recently diagnosed with colon CA. He admits to both IV and inhaled cocaine use in the past.  He states that he has not used injection drugs in over 20 years.  He does not recall sharing needles when he participated in this habit.  He denies any tattoos, body piercings, or blood transfusions in the past.  He last used inhaled cocaine approximately 3 to 4 weeks ago.  Work-up of his hepatitis C was confirmatory of chronic infection with genotype 1a infection and viremia of 2,120,000 IU.  He also recently had a colonoscopy performed in June which showed no abnormalities. He did have a right upper quadrant ultrasound performed earlier this year that was concerning for early cirrhosis, but his FibroScan showed only F0-F1 fibrosis. This summer, he had an excisional biopsy of a neck mass that confirmed a poorly differentiated squamous cell carcinoma.  He deferred surgical intervention for his malignancy and oncology opted to defer chemotherapy.  He did start XRT treatments 5 times weekly approximately 3 to 4 weeks ago.  He states that his radiation oncologist tentatively plans for 7-week treatment in total.  Patient has significantly reduced his alcohol intake to only 1 to 2 40 ounce cans of beer weekly.  He has developed a loss of appetite and occasional difficulty swallowing food and some pills while he has been receiving XRT.  He has stopped taking Prilosec since our last visit.  He is agreeable to delaying DAA treatment for his hepatitis C until his XRT has been completed.  Of note, the patient also had a PET scan performed last month that showed bilateral enhancing lymph nodes in his neck.  His prior MRI of his abdomen also  showed 2 hypoattenuated but too small to characterize lesions within his liver.  Past Medical History:  Diagnosis Date   Diabetes mellitus without complication ([place])    no meds   ETOH abuse    Frequent urination    Glaucoma    Hepatitis C virus infection without hepatic coma    dx'ed in 11/2018   Wears denture    upper only; lost lower denture    Past Surgical History:  Procedure Laterality Date   ANKLE SURGERY  2011   right ankle   COLONOSCOPY  02/2019   polyps - Dr Havery Moros   DIRECT LARYNGOSCOPY N/A 04/01/2019   Procedure: DIRECT LARYNGOSCOPY WITH BIOPSY;  Surgeon: Jodi Marble, MD;  Location: [facility];  Service: ENT;  Laterality: N/A;   ESOPHAGOSCOPY N/A 04/01/2019   Procedure: ESOPHAGOSCOPY;  Surgeon: Jodi Marble, MD;  Location: [facility];  Service: ENT;  Laterality: N/A;   EYE SURGERY Right    KNEE SURGERY     LARYNGOSCOPY AND BRONCHOSCOPY N/A 04/01/2019   Procedure: BRONCHOSCOPY;  Surgeon: Jodi Marble, MD;  Location: [facility];  Service: ENT;  Laterality: N/A;     Family History  Problem Relation Age of Onset   Breast cancer Sister    Colon cancer Brother 7       ????   Cancer Brother      Social History   Tobacco Use   Smoking status: Current Some Day Smoker    Packs/day:  0.30    Years: 50.00    Pack years: 15.00    Types: Cigarettes   Smokeless tobacco: Never Used   Tobacco comment: He reports smoking around 2 cigarettes daily 04/27/19  Substance Use Topics   Alcohol use: Yes    Comment: occvasional   Drug use: Yes    Types: Cocaine    Comment: none for 6 months      has no history on file for sexual activity. Admits to using condoms with sexual partners. He is disabled secondary to fall from roof and fracturing his RT ankle.  No Known Allergies   Outpatient Medications Prior to Visit  Medication Sig Dispense Refill   acetaminophen (TYLENOL) 500 MG tablet Take 1,000 mg by mouth every 6 (six) hours as needed for moderate pain or  headache.     aspirin EC 325 MG tablet Take 650 mg by mouth daily as needed for moderate pain.     atropine 1 % ophthalmic solution Place 1 drop into both eyes 2 (two) times a day.     cetirizine (ZYRTEC) 10 MG tablet Take 10 mg by mouth daily as needed for allergies.     ibuprofen (ADVIL) 200 MG tablet Take 400 mg by mouth every 6 (six) hours as needed for headache or moderate pain.     lidocaine (XYLOCAINE) 2 % solution Patient: Mix 1part 2% viscous lidocaine, 1part H20. Swallow 15mL of diluted mixture, 49min before meals and at bedtime, up to QID 100 mL 5   prednisoLONE acetate (PRED FORTE) 1 % ophthalmic suspension Place 1 drop into the right eye 3 (three) times daily.      tolterodine (DETROL LA) 4 MG 24 hr capsule Take 4 mg by mouth daily.     latanoprost (XALATAN) 0.005 % ophthalmic solution Place 1 drop into both eyes at bedtime.      No facility-administered medications prior to visit.      Review of Systems  Constitutional: Positive for appetite change. Negative for chills, fatigue and fever.  HENT: Negative for congestion, hearing loss, rhinorrhea and sinus pressure.   Eyes: Negative for photophobia, pain, redness and visual disturbance.  Respiratory: Negative for apnea, cough, shortness of breath and wheezing.   Cardiovascular: Negative for chest pain and palpitations.  Gastrointestinal: Negative for abdominal pain, constipation, diarrhea, nausea and vomiting.       Difficulty swallowing  Endocrine: Negative for cold intolerance, heat intolerance, polydipsia and polyuria.  Genitourinary: Positive for frequency. Negative for decreased urine volume, dysuria, hematuria and testicular pain.       Urinary incontinence, chronic  Musculoskeletal: Negative for back pain, myalgias and neck pain.  Skin: Negative for pallor and rash.  Allergic/Immunologic: Negative for immunocompromised state.  Neurological: Negative for dizziness, seizures, syncope, speech difficulty and  light-headedness.  Hematological: Does not bruise/bleed easily.  Psychiatric/Behavioral: Negative for agitation and hallucinations. The patient is not nervous/anxious.      Vitals:   06/09/19 1507  BP: 118/80  Pulse: 77  Resp: (!) 98  Temp: 98.7 F (37.1 C)     Physical Exam Gen: pleasant, thin, NAD, A&Ox 3 Head: NCAT, mild bitemporal wasting evident EENT: PERRL, EOMI, MMM, adequate dentition, no icterus but muddy sclera (pt wearing sunglasses to visit prior to exam) Neck: supple, no JVD CV: NRRR, no murmurs evident Pulm: CTA bilaterally, no wheeze or retractions Abd: soft, NTND, +BS Extrems: trace LE edema, 2+ pulses Skin: no rashes, poor skin turgor Neuro: CN II-XII grossly intact, no focal neurologic deficits  appreciated, gait was slowed and wide stanced, A&Ox 3   Labs: Lab Results  Component Value Date   HEPBSAG NON-REACTIVE 11/30/2018    Lab Results  Component Value Date   HCVRNAPCRQN 2,120,000 (H) 11/30/2018    No results found for: [facility]  Lab Results  Component Value Date   HCVGENOTYPE 1a 12/13/2018    Lab Results  Component Value Date   WBC 3.8 (L) 05/04/2019   HGB 14.0 05/04/2019   HCT 42.7 05/04/2019   MCV 98.2 05/04/2019   PLT 152 05/04/2019       Chemistry      Component Value Date/Time   NA 141 05/04/2019 1207   K 4.3 05/04/2019 1207   CL 105 05/04/2019 1207   CO2 26 05/04/2019 1207   BUN 9 05/04/2019 1207   CREATININE 0.74 05/04/2019 1207      Component Value Date/Time   CALCIUM 9.2 05/04/2019 1207   ALKPHOS 54 05/04/2019 1207   AST 60 (H) 05/04/2019 1207   ALT 61 (H) 05/04/2019 1207   BILITOT 0.5 05/04/2019 1207        Assessment/Plan: The patient is a 68 year old African-American male with chronic alcohol abuse, questionable GERD, and recently diagnosed SCC of the throat undergoing active XRT presenting for management of chronic hepatitis C.  1. Chronic HCV - HCV Ab was positive in 11/2018, thus confirming past exposure to  pathogen. Note: this screening test will remain positive/reactive life-long even if HCV infection has been cured/immunologically cleared. Most likely risk factor for acquisition was past IV drug use or intranasal cocaine use as he denies tattoo placement and blood transfusion. HCV viral load was confirmed at 2.12 million IU with HCV genotype 1a. HIV Ab was negative, thus excluding co-infection. As chronic infection has been confirmed, He underwent a FibroScan that showed only F0-F1 fibrosis (sharp contrast from RUQ U/S performed earlier this year that suggested early cirrhosis). Ultimately, we will likely treat with harvoni for 8 weeks once XRT treatment of his newly diagnosed SCC/throat CA as this has already impaired his ability to reliably swallow pills and some foods. He deferred operative intervention, so chemotherapy has been deferred as well. Based on his FibroScan results, I would not anticipate his HCV to accelerate (barring need for high-dose steroids for a prolonged period), so will hold off on HCV treatment until his XRT has ended. His F0-F1 fibrosis confirmation would predict a low risk of developing cirrhosis or primary [place] by a delay in HCV tx. Our pharmacy staff was successful in reaching out to his prescriber of prilosec and had them hold this medication as he would have a higher chance for HCV cure if this could be held. F/u in 5 weeks for re-assessment.  2.  Alcohol and substance abuse- I congratulated the patient on his reduction in his alcohol intake to only 1-2 40 oz cans of beer/week recently. I continued to encourage him to regularly attend alcoholics anonymous meetings.  It would be preferable if the patient also had secured a sponsor for AA by the time of his next visit as well.  I discouraged the patient using intranasal cocaine to avoid reinfection or re-exposure for HCV.  3. Neck carcinoma - Biopsy performed on 04/01/2019 confirmed poorly differentiated squamous cell carcinoma.  He  deferred surgical intervention and as a result chemotherapy has also been withheld as well.  He is now approximately 4 weeks into a planned 7 weeks of 5 time weekly XRT for his malignancy.  There appears to be  discrepancy in interpretation of his recent PET scan which did show bilateral enhancing nodes to his neck.  This does raise some concern that the hepatic lesions previously noted may also be consistent with metastases.  Would consider repeating the patient's abdominal MRI to follow-up on the 2 nondiagnostic hypo-dense lesions within his liver within the next month or two.  4. Health maintenance -  I have counselled the patient extensively re: the need for barrier precautions with sexual activity in order to prevent unwanted transmission of HCV. He must continue these practices until 3 months after treatment completion until a sustained virologic response (SVR) has been confirmed to establish a cure of his infection. He expressed full understanding of these instructions. Abstinence from cigarettes, alcohol, and illicit drugs was recommended.  He has now completed vaccination for hepatitis B and he is due to receive a second dose of hepatitis A vaccine in February 2021.  He would benefit from a flu vaccine annually as well.

## 2019-06-09 NOTE — Patient Instructions (Signed)
Continue to limit alcohol intake. Complete radiation treatment for throat CA and then return for appointment with Dr. Prince Rome in 5 weeks to discuss starting HCV treatment.

## 2019-06-10 ENCOUNTER — Other Ambulatory Visit: Payer: Self-pay

## 2019-06-10 ENCOUNTER — Ambulatory Visit
Admission: RE | Admit: 2019-06-10 | Discharge: 2019-06-10 | Disposition: A | Discharge status: Home / Self Care | Payer: Medicare HMO | Source: Ambulatory Visit | Attending: Radiation Oncology | Admitting: Radiation Oncology

## 2019-06-10 DIAGNOSIS — Z510 Encounter for antineoplastic radiation therapy: Secondary | ICD-10-CM | POA: Diagnosis not present

## 2019-06-11 ENCOUNTER — Ambulatory Visit: Payer: Medicare HMO

## 2019-06-13 ENCOUNTER — Ambulatory Visit
Admission: RE | Admit: 2019-06-13 | Discharge: 2019-06-13 | Disposition: A | Discharge status: Home / Self Care | Payer: Medicare HMO | Source: Ambulatory Visit | Attending: Radiation Oncology | Admitting: Radiation Oncology

## 2019-06-13 ENCOUNTER — Other Ambulatory Visit: Payer: Self-pay

## 2019-06-13 DIAGNOSIS — Z510 Encounter for antineoplastic radiation therapy: Secondary | ICD-10-CM | POA: Diagnosis not present

## 2019-06-14 ENCOUNTER — Ambulatory Visit
Admission: RE | Admit: 2019-06-14 | Discharge: 2019-06-14 | Disposition: A | Discharge status: Home / Self Care | Payer: Medicare HMO | Source: Ambulatory Visit | Attending: Radiation Oncology | Admitting: Radiation Oncology

## 2019-06-14 ENCOUNTER — Other Ambulatory Visit: Payer: Self-pay

## 2019-06-14 ENCOUNTER — Inpatient Hospital Stay: Payer: Medicare HMO | Admitting: Nutrition

## 2019-06-14 DIAGNOSIS — Z510 Encounter for antineoplastic radiation therapy: Secondary | ICD-10-CM | POA: Diagnosis not present

## 2019-06-14 NOTE — Progress Notes (Signed)
RD working remotely.  Nutrition follow up completed with patient over the phone. He is receiving radiation for Supraglottis cancer. He reports his throat is beginning to get sore and his skin is getting dark where the radiation is directed. He has some increased mucus. He has been drinking Ensure Enlive and milkshakes. States he lost a couple pounds.  Nutrition Diagnosis: Food and Nutrition Related Knowledge Deficit improved.  Intervention: Educated patient to continue increased calories and protein in soft foods, liquids, and ONS. Will provide a third complimentary case of ensure enlive on Monday at his request. Questions answered and teach back method used.  Monitoring, Evaluation, Goals: Patient will increase oral intake to minimize wt loss.  Next Visit:Tuesday, Oct 6, by telephone.

## 2019-06-15 ENCOUNTER — Other Ambulatory Visit: Payer: Self-pay

## 2019-06-15 ENCOUNTER — Ambulatory Visit
Admission: RE | Admit: 2019-06-15 | Discharge: 2019-06-15 | Disposition: A | Discharge status: Home / Self Care | Payer: Medicare HMO | Source: Ambulatory Visit | Attending: Radiation Oncology | Admitting: Radiation Oncology

## 2019-06-15 DIAGNOSIS — Z510 Encounter for antineoplastic radiation therapy: Secondary | ICD-10-CM | POA: Diagnosis not present

## 2019-06-16 ENCOUNTER — Encounter: Payer: Self-pay | Admitting: *Deleted

## 2019-06-16 ENCOUNTER — Other Ambulatory Visit: Payer: Self-pay

## 2019-06-16 ENCOUNTER — Ambulatory Visit
Admission: RE | Admit: 2019-06-16 | Discharge: 2019-06-16 | Disposition: A | Discharge status: Home / Self Care | Payer: Medicare HMO | Source: Ambulatory Visit | Attending: Radiation Oncology | Admitting: Radiation Oncology

## 2019-06-16 DIAGNOSIS — C321 Malignant neoplasm of supraglottis: Secondary | ICD-10-CM | POA: Insufficient documentation

## 2019-06-16 DIAGNOSIS — Z510 Encounter for antineoplastic radiation therapy: Secondary | ICD-10-CM | POA: Insufficient documentation

## 2019-06-16 MED ORDER — SONAFINE EX EMUL
1.0000 "application " | Freq: Once | CUTANEOUS | Status: AC
  Administered 2019-06-16: 1 via TOPICAL

## 2019-06-16 NOTE — Progress Notes (Signed)
Oncology Nurse Navigator Documentation  Met with Mr. Mendell s/p RT to check on his well-being.  He acknowledged he has completed slightly more than half his tmts.    He reported doing well, denied notable SEs.  He stated he is applying Sonafine BID.  He requested additional tube, understood I will provide tomorrow.  He confirmed recent appt with SLP Garald Balding, stated he is conducting HEP as directed.  I reiterated importance of swallowing exercises to maximize swallowing function during and after completion of treatment.  He voiced understanding.  He confirmed recent telephone call with RD Dory Peru. I commended him for his compliance with tmt appts, HEP and follow-up appts with SLP and Nutrition.  He voiced appreciation. He voiced understanding he can call me with needs/concerns.  Gayleen Orem, RN, BSN Head & Neck Oncology Nurse [place] at [place] [phone]

## 2019-06-17 ENCOUNTER — Ambulatory Visit
Admission: RE | Admit: 2019-06-17 | Discharge: 2019-06-17 | Disposition: A | Discharge status: Home / Self Care | Payer: Medicare HMO | Source: Ambulatory Visit | Attending: Radiation Oncology | Admitting: Radiation Oncology

## 2019-06-17 ENCOUNTER — Other Ambulatory Visit: Payer: Self-pay

## 2019-06-17 DIAGNOSIS — Z510 Encounter for antineoplastic radiation therapy: Secondary | ICD-10-CM | POA: Diagnosis not present

## 2019-06-20 ENCOUNTER — Other Ambulatory Visit: Payer: Self-pay | Admitting: Radiation Oncology

## 2019-06-20 ENCOUNTER — Other Ambulatory Visit: Payer: Self-pay

## 2019-06-20 ENCOUNTER — Ambulatory Visit
Admission: RE | Admit: 2019-06-20 | Discharge: 2019-06-20 | Disposition: A | Discharge status: Home / Self Care | Payer: Medicare HMO | Source: Ambulatory Visit | Attending: Radiation Oncology | Admitting: Radiation Oncology

## 2019-06-20 DIAGNOSIS — Z510 Encounter for antineoplastic radiation therapy: Secondary | ICD-10-CM | POA: Diagnosis not present

## 2019-06-20 DIAGNOSIS — C321 Malignant neoplasm of supraglottis: Secondary | ICD-10-CM

## 2019-06-20 MED ORDER — DOCUSATE SODIUM 100 MG PO CAPS: 200.0000 mg | ORAL_CAPSULE | Freq: Every day | ORAL | 1 refills | Status: DC

## 2019-06-20 MED ORDER — HYDROCODONE-ACETAMINOPHEN 7.5-325 MG/15ML PO SOLN: 10.0000 mL | ORAL | 0 refills | Status: DC | PRN

## 2019-06-21 ENCOUNTER — Other Ambulatory Visit: Payer: Self-pay

## 2019-06-21 ENCOUNTER — Inpatient Hospital Stay: Payer: Medicare HMO | Attending: Hematology | Admitting: Nutrition

## 2019-06-21 ENCOUNTER — Ambulatory Visit
Admission: RE | Admit: 2019-06-21 | Discharge: 2019-06-21 | Disposition: A | Discharge status: Home / Self Care | Payer: Medicare HMO | Source: Ambulatory Visit | Attending: Radiation Oncology | Admitting: Radiation Oncology

## 2019-06-21 DIAGNOSIS — Z510 Encounter for antineoplastic radiation therapy: Secondary | ICD-10-CM | POA: Diagnosis not present

## 2019-06-21 NOTE — Progress Notes (Signed)
Nutrition follow-up completed with patient over the phone. Patient completes radiation treatments on October 20. Patient is receiving radiation therapy for supraglottis cancer. He is hoarse today and reports his throat continues to be sore. He is drinking Ensure Enlive and was grateful for the case provided to him this week. Reports an episode of nausea and vomiting.  He took appropriate medication which helped. Patient reports he gained weight this week and reports his weight was 142 pounds on Monday. He has no other questions or concerns.  Nutrition diagnosis: Food and nutrition related knowledge deficit continues to improve.  Intervention: Patient was educated to continue soft high-calorie high-protein foods along with oral nutrition supplements to provide adequate calories and protein. Patient understands goal is weight maintenance. Teach back method used.  Monitoring, evaluation, goals: Patient will tolerate adequate calories and protein for weight maintenance.  Next visit: Tuesday, October 13 by telephone.  **Disclaimer: This note was dictated with voice recognition software. Similar sounding words can inadvertently be transcribed and this note may contain transcription errors which may not have been corrected upon publication of note.**

## 2019-06-22 ENCOUNTER — Other Ambulatory Visit: Payer: Self-pay

## 2019-06-22 ENCOUNTER — Ambulatory Visit
Admission: RE | Admit: 2019-06-22 | Discharge: 2019-06-22 | Disposition: A | Discharge status: Home / Self Care | Payer: Medicare HMO | Source: Ambulatory Visit | Attending: Radiation Oncology | Admitting: Radiation Oncology

## 2019-06-22 DIAGNOSIS — Z510 Encounter for antineoplastic radiation therapy: Secondary | ICD-10-CM | POA: Diagnosis not present

## 2019-06-23 ENCOUNTER — Other Ambulatory Visit: Payer: Self-pay

## 2019-06-23 ENCOUNTER — Ambulatory Visit
Admission: RE | Admit: 2019-06-23 | Discharge: 2019-06-23 | Disposition: A | Discharge status: Home / Self Care | Payer: Medicare HMO | Source: Ambulatory Visit | Attending: Radiation Oncology | Admitting: Radiation Oncology

## 2019-06-23 DIAGNOSIS — Z510 Encounter for antineoplastic radiation therapy: Secondary | ICD-10-CM | POA: Diagnosis not present

## 2019-06-24 ENCOUNTER — Other Ambulatory Visit: Payer: Self-pay

## 2019-06-24 ENCOUNTER — Ambulatory Visit
Admission: RE | Admit: 2019-06-24 | Discharge: 2019-06-24 | Disposition: A | Discharge status: Home / Self Care | Payer: Medicare HMO | Source: Ambulatory Visit | Attending: Radiation Oncology | Admitting: Radiation Oncology

## 2019-06-24 ENCOUNTER — Telehealth: Payer: Self-pay | Admitting: *Deleted

## 2019-06-24 DIAGNOSIS — Z510 Encounter for antineoplastic radiation therapy: Secondary | ICD-10-CM | POA: Diagnosis not present

## 2019-06-24 NOTE — Telephone Encounter (Signed)
Oncology Nurse Navigator Documentation  Rec'd call from pt friend/caregiver Verdene Lennert, confirmed 10:45 appt on Monday, 10/12. (Appt not showing in [place])  Gayleen Orem, RN, BSN Head & Neck Oncology Nurse [place] at [place] [phone]

## 2019-06-25 ENCOUNTER — Ambulatory Visit: Payer: Medicare HMO

## 2019-06-27 ENCOUNTER — Ambulatory Visit: Payer: Medicare HMO

## 2019-06-28 ENCOUNTER — Inpatient Hospital Stay: Payer: Medicare HMO | Admitting: Nutrition

## 2019-06-28 ENCOUNTER — Other Ambulatory Visit: Payer: Self-pay

## 2019-06-28 ENCOUNTER — Ambulatory Visit
Admission: RE | Admit: 2019-06-28 | Discharge: 2019-06-28 | Disposition: A | Discharge status: Home / Self Care | Payer: Medicare HMO | Source: Ambulatory Visit | Attending: Radiation Oncology | Admitting: Radiation Oncology

## 2019-06-28 DIAGNOSIS — Z510 Encounter for antineoplastic radiation therapy: Secondary | ICD-10-CM | POA: Diagnosis not present

## 2019-06-28 NOTE — Progress Notes (Signed)
Nutrition follow-up completed with patient who is receiving radiation therapy for supraglottis cancer. Final treatment scheduled for Wednesday, October 21. Patient reports he has no appetite. He states he has a little bit of nausea and he vomited after drinking milk this morning. He reports he does not have any nausea medicine and he forgot to talk to his doctor. Patient is making milkshakes using oral nutrition supplements and reports that he has enough samples for now.  Nutrition diagnosis:  Food and nutrition related knowledge deficit improved.  Intervention: Educated patient to continue eating even though he does not have a big appetite. Encouraged him to discuss nausea medication with physician. Recommended patient continue soft bland foods that are high in calories and protein. Patient agrees to contact me for any questions or concerns.  Monitoring, evaluation, goals: Patient will tolerate adequate calories and protein to minimize weight loss.  No follow-up is scheduled.  Patient has my contact information.

## 2019-06-29 ENCOUNTER — Other Ambulatory Visit: Payer: Self-pay

## 2019-06-29 ENCOUNTER — Ambulatory Visit
Admission: RE | Admit: 2019-06-29 | Discharge: 2019-06-29 | Disposition: A | Discharge status: Home / Self Care | Payer: Medicare HMO | Source: Ambulatory Visit | Attending: Radiation Oncology | Admitting: Radiation Oncology

## 2019-06-29 DIAGNOSIS — Z510 Encounter for antineoplastic radiation therapy: Secondary | ICD-10-CM | POA: Diagnosis not present

## 2019-06-30 ENCOUNTER — Ambulatory Visit
Admission: RE | Admit: 2019-06-30 | Discharge: 2019-06-30 | Disposition: A | Discharge status: Home / Self Care | Payer: Medicare HMO | Source: Ambulatory Visit | Attending: Radiation Oncology | Admitting: Radiation Oncology

## 2019-06-30 ENCOUNTER — Ambulatory Visit: Payer: Medicare HMO | Attending: Radiation Oncology

## 2019-06-30 ENCOUNTER — Ambulatory Visit: Payer: Medicare HMO

## 2019-06-30 DIAGNOSIS — R1310 Dysphagia, unspecified: Secondary | ICD-10-CM | POA: Diagnosis present

## 2019-06-30 DIAGNOSIS — Z510 Encounter for antineoplastic radiation therapy: Secondary | ICD-10-CM | POA: Diagnosis not present

## 2019-06-30 NOTE — Therapy (Signed)
[facility] [address] [place], [place], [address] Phone: [phone]   Fax:  [phone]  Speech Language Pathology Treatment  Patient Details  Name: [name] MRN: [id number] Date of Birth: 25-Jul-1951 Referring Provider (SLP): [name], MD   Encounter Date: 06/30/2019  End of Session - 06/30/19 1026    Visit Number  3    Number of Visits  4    Date for SLP Re-Evaluation  08/05/19    SLP Start Time  63    SLP Stop Time   27    SLP Time Calculation (min)  31 min    Activity Tolerance  Patient tolerated treatment well       Past Medical History:  Diagnosis Date  . Diabetes mellitus without complication ([facility])    no meds  . ETOH abuse   . Frequent urination   . Glaucoma   . Hepatitis C virus infection without hepatic coma    dx'ed in 11/2018  . Wears denture    upper only; lost lower denture    Past Surgical History:  Procedure Laterality Date  . ANKLE SURGERY  2011   right ankle  . COLONOSCOPY  02/2019   polyps - Dr Havery Moros  . DIRECT LARYNGOSCOPY N/A 04/01/2019   Procedure: DIRECT LARYNGOSCOPY WITH BIOPSY;  Surgeon: Jodi Marble, MD;  Location: [facility];  Service: ENT;  Laterality: N/A;  . ESOPHAGOSCOPY N/A 04/01/2019   Procedure: ESOPHAGOSCOPY;  Surgeon: Jodi Marble, MD;  Location: [facility];  Service: ENT;  Laterality: N/A;  . EYE SURGERY Right   . KNEE SURGERY    . LARYNGOSCOPY AND BRONCHOSCOPY N/A 04/01/2019   Procedure: BRONCHOSCOPY;  Surgeon: Jodi Marble, MD;  Location: [facility];  Service: ENT;  Laterality: N/A;    There were no vitals filed for this visit.  Subjective Assessment - 06/30/19 1005    Subjective  Pt with <5 tx left. Reports dysgeusia, enters with Ensure Enlive 3/4 gone.            ADULT SLP TREATMENT - 06/30/19 0001      Treatment Provided   Treatment provided  Dysphagia      Dysphagia Treatment   Temperature Spikes Noted  No    Treatment Methods   Skilled observation;Therapeutic exercise;Patient/caregiver education    Patient observed directly with PO's  Yes    Type of PO's observed  Thin liquids    Feeding  Able to feed self    Liquids provided via  --   bottle   Oral Phase Signs & Symptoms  --   none noted   Pharyngeal Phase Signs & Symptoms  Delayed throat clear;Wet vocal quality   delayed hydrophonia (after ~3 min POs) with throat clear   Type of cueing  Verbal;Visual    Amount of cueing  Minimal    Other treatment/comments  Cues above necessary for HEP; SLP told pt to do chin tuck against resistance multiple times each day as odynophagia was prevalent, per pt. SLP told pt to do what he can of swallowing exercises (effortful, Masako, mendelsohn, etc) right now and to add in more and more as odynophagia resolves. Pt was told why he needed to complete HEP, as he could not tell SLP rationale.       Assessment / Recommendations / Plan   Plan  Continue with current plan of care      Dysphagia Recommendations   Diet recommendations  --   as tolerated - focus  on liquids/Ensure Enlive   Liquids provided via  --   bottle   Medication Administration  --   as tolerated     Progression Toward Goals   Progression toward goals  Not progressing toward goals (comment)   ? compliance with HEP      SLP Education - 06/30/19 1026    Education Details  rationale for HEP, HEP procedure    Person(s) Educated  Patient    Methods  Explanation;Demonstration;Handout;Verbal cues    Comprehension  Verbalized understanding;Returned demonstration;Verbal cues required;Need further instruction       SLP Short Term Goals - 06/30/19 1028      SLP SHORT TERM GOAL #1   Title  pt will demo understanding of correct procedure for HEP    Period  --   visits (visit #3), for all STGs   Status  Not Met      SLP SHORT TERM GOAL #2   Title  pt will tell SLP why he is completing HEP    Status  Partially Met       SLP Long Term Goals - 06/30/19 1028       SLP LONG TERM GOAL #1   Title  pt will demo understanding of proper procedure for HEP over two sessions    Time  1    Period  --   visits, for all LTGs (visit #4)   Status  On-going      SLP LONG TERM GOAL #2   Title  pt will tell SLP why a food journal can expediate return to diet most like pre-cancer diet    Time  1    Status  On-going       Plan - 06/30/19 1027    Clinical Impression Statement  Pt presents today with swallowing ability reportedly [facility] for liquids (Ensure Enlive). Discomfort is reported with all POs currently. Pt last rad tx on 07-04-19. SLP had to cue pt consistently for procedure for HEP - SLP ?s pt's compliance with HEP. SLP reitereated the rationale for HEP. The probability for incr'd dysphagia during and after radiation therapy increases as pt progresses through his rad tx, and skilled ST is recommended to regularly assess pt's safety with POs as well as assess pt's procedure with prescribed HEP. Visits will take place via virtual session or in-person sessions.    Speech Therapy Frequency  --   once every approx 4 weeks   Duration  --   4 total visits   Treatment/Interventions  Aspiration precaution training;Pharyngeal strengthening exercises;Diet toleration management by SLP;Internal/external aids;Patient/family education;Trials of upgraded texture/liquids;SLP instruction and feedback    Potential to Achieve Goals  Good    SLP Home Exercise Plan  provided today    Consulted and Agree with Plan of Care  Patient       Patient will benefit from skilled therapeutic intervention in order to improve the following deficits and impairments:   Dysphagia, unspecified type    Problem List Patient Active Problem List   Diagnosis Date Noted  . Chronic hepatitis C without hepatic coma ([place]) 06/09/2019  . 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 Squamous cell carcinoma of neck 04/01/2019    [facility] ,MS, CCC-SLP  06/30/2019,  10:29 AM  [facility] [address] [place], [place], [address] Phone: [phone]   Fax:  [phone]   Name: [name] MRN: [id number] Date of Birth: Jul 15, 1951

## 2019-07-01 ENCOUNTER — Ambulatory Visit: Payer: Medicare HMO

## 2019-07-01 ENCOUNTER — Other Ambulatory Visit: Payer: Self-pay

## 2019-07-01 ENCOUNTER — Ambulatory Visit
Admission: RE | Admit: 2019-07-01 | Discharge: 2019-07-01 | Disposition: A | Discharge status: Home / Self Care | Payer: Medicare HMO | Source: Ambulatory Visit | Attending: Radiation Oncology | Admitting: Radiation Oncology

## 2019-07-01 DIAGNOSIS — Z510 Encounter for antineoplastic radiation therapy: Secondary | ICD-10-CM | POA: Diagnosis not present

## 2019-07-04 ENCOUNTER — Ambulatory Visit
Admission: RE | Admit: 2019-07-04 | Discharge: 2019-07-04 | Disposition: A | Discharge status: Home / Self Care | Payer: Medicare HMO | Source: Ambulatory Visit | Attending: Radiation Oncology | Admitting: Radiation Oncology

## 2019-07-04 ENCOUNTER — Ambulatory Visit: Payer: Medicare HMO

## 2019-07-04 ENCOUNTER — Other Ambulatory Visit: Payer: Self-pay

## 2019-07-04 ENCOUNTER — Other Ambulatory Visit: Payer: Self-pay | Admitting: Radiation Oncology

## 2019-07-04 DIAGNOSIS — C321 Malignant neoplasm of supraglottis: Secondary | ICD-10-CM

## 2019-07-04 DIAGNOSIS — Z510 Encounter for antineoplastic radiation therapy: Secondary | ICD-10-CM | POA: Diagnosis not present

## 2019-07-04 MED ORDER — BENZONATATE 200 MG PO CAPS: 200.0000 mg | ORAL_CAPSULE | Freq: Three times a day (TID) | ORAL | 3 refills | Status: DC | PRN

## 2019-07-04 MED ORDER — GABAPENTIN 300 MG PO CAPS: 300.0000 mg | ORAL_CAPSULE | Freq: Three times a day (TID) | ORAL | 0 refills | Status: DC

## 2019-07-04 MED ORDER — SONAFINE EX EMUL
1.0000 "application " | Freq: Once | CUTANEOUS | Status: AC
  Administered 2019-07-04: 1 via TOPICAL

## 2019-07-05 ENCOUNTER — Ambulatory Visit: Payer: Medicare HMO

## 2019-07-05 ENCOUNTER — Ambulatory Visit
Admission: RE | Admit: 2019-07-05 | Discharge: 2019-07-05 | Disposition: A | Discharge status: Home / Self Care | Payer: Medicare HMO | Source: Ambulatory Visit | Attending: Radiation Oncology | Admitting: Radiation Oncology

## 2019-07-05 ENCOUNTER — Other Ambulatory Visit: Payer: Self-pay

## 2019-07-05 DIAGNOSIS — Z510 Encounter for antineoplastic radiation therapy: Secondary | ICD-10-CM | POA: Diagnosis not present

## 2019-07-06 ENCOUNTER — Encounter: Payer: Self-pay | Admitting: Nutrition

## 2019-07-06 ENCOUNTER — Other Ambulatory Visit: Payer: Self-pay

## 2019-07-06 ENCOUNTER — Ambulatory Visit
Admission: RE | Admit: 2019-07-06 | Discharge: 2019-07-06 | Disposition: A | Discharge status: Home / Self Care | Payer: Medicare HMO | Source: Ambulatory Visit | Attending: Radiation Oncology | Admitting: Radiation Oncology

## 2019-07-06 ENCOUNTER — Encounter: Payer: Self-pay | Admitting: *Deleted

## 2019-07-06 ENCOUNTER — Encounter: Payer: Self-pay | Admitting: Radiation Oncology

## 2019-07-06 DIAGNOSIS — Z510 Encounter for antineoplastic radiation therapy: Secondary | ICD-10-CM | POA: Diagnosis not present

## 2019-07-06 NOTE — Progress Notes (Signed)
Provided 1 complementary case of Ensure Enlive. 

## 2019-07-07 NOTE — Progress Notes (Signed)
Oncology Nurse Navigator Documentation  Met with pt during final RT to offer support and to celebrate end of radiation treatment.   Provided verbal/written post-RT guidance:  Importance of keeping all follow-up appts, especially those with Nutrition and SLP.  Importance of protecting treatment area from sun.  Continuation of Sonafine application 2-3 times daily, application of abx ointment to areas of raw skin; when supply of Sonafine exhausted transition to OTC lotion with vitamin E. Provided/reviewed Epic calendar of upcoming appts. Explained my role as navigator will continue for several more months, I will be calling or joining him during follow-up visits.   He agreed to call me with questions/concerns.  Gayleen Orem, RN, BSN Head & Neck Oncology [place] at [place] [phone]

## 2019-07-14 ENCOUNTER — Ambulatory Visit: Payer: Medicare HMO | Admitting: Infectious Diseases

## 2019-07-19 NOTE — Progress Notes (Signed)
[name] presents for follow up of radiation completed 07/06/19 to his Supraglottis.   Pain issues, if any: He reports mild pain to his throat. He tells when his throat is dry after breathing through his mouth.  Using a feeding tube?: N/A Weight changes, if any:  Wt Readings from Last 3 Encounters:  07/22/19 137 lb 12.8 oz (62.5 kg)  05/04/19 139 lb 1.6 oz (63.1 kg)  04/06/19 133 lb (60.3 kg)   Swallowing issues, if any: He reports difficulty swallowing. He has just begun eating more solid foods instead of mashed potatoes, pudding. He is drinking about 4 ensures daily.  Smoking or chewing tobacco? He denies.  Using fluoride trays daily? N/A Last ENT visit was on: Dr. Erik Obey [id number].  Impression & Plans:  The skin to his radiation site is slightly hyperpigmented. He continues to use sonafine to the area.  Satisfactory check, 3 weeks post completion of radiation. Plan: I want him to continue to push his nutrition. Return visit here 2 months. Alan Gilford, MD  Other notable issues, if any:  07/26/19 [name] RD 08/04/19 [place].

## 2019-07-22 ENCOUNTER — Encounter: Payer: Self-pay | Admitting: Radiation Oncology

## 2019-07-22 ENCOUNTER — Ambulatory Visit
Admission: RE | Admit: 2019-07-22 | Discharge: 2019-07-22 | Disposition: A | Discharge status: Home / Self Care | Payer: Medicare HMO | Source: Ambulatory Visit | Attending: Radiation Oncology | Admitting: Radiation Oncology

## 2019-07-22 ENCOUNTER — Other Ambulatory Visit: Payer: Self-pay

## 2019-07-22 VITALS — BP 133/96 | HR 74 | Temp 98.7°F | Wt 137.8 lb

## 2019-07-22 DIAGNOSIS — C4442 Squamous cell carcinoma of skin of scalp and neck: Secondary | ICD-10-CM

## 2019-07-22 DIAGNOSIS — Z79899 Other long term (current) drug therapy: Secondary | ICD-10-CM | POA: Insufficient documentation

## 2019-07-22 DIAGNOSIS — C321 Malignant neoplasm of supraglottis: Secondary | ICD-10-CM

## 2019-07-22 DIAGNOSIS — Z923 Personal history of irradiation: Secondary | ICD-10-CM | POA: Insufficient documentation

## 2019-07-22 NOTE — Progress Notes (Signed)
  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 He experienced some blood-tinged saliva towards the end of treatment. He reported throat pain and denied any other issues. He was noted to have patches of peeling over his neck.  Plan: The patient will follow-up with radiation oncology in 2 weeks.  ________________________________________________   [name], MD  This document serves as a record of services personally performed by [name], MD. It was created on her behalf by Wilburn Mylar, a trained medical scribe. The creation of this record is based on the scribe's personal observations and the provider's statements to them. This document has been checked and approved by the attending provider.

## 2019-07-22 NOTE — Progress Notes (Signed)
Radiation Oncology         [phone] ________________________________  Name: [name] MRN: [id number]  Date: 07/22/2019  DOB: October 27, 1950  Follow-Up Visit Note; outpatient, in person  CC: [name], MD  Izora Gala, MD  Diagnosis and Prior Radiotherapy:       ICD-10-CM   1. 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 Squamous cell carcinoma of neck  C44.42      05/12/2019 through 07/06/2019 Site Technique Total Dose (Gy) Dose per Fx (Gy) Completed Fx Beam Energies  Head & neck: HN_larynx IMRT 70/70 2 35/35 6X   CHIEF COMPLAINT:  Here for follow-up and surveillance of laryngeal cancer  Narrative:  The patient returns today for routine follow-up. Since radiation completion, he was seen in follow up by Dr. Erik Obey at [facility].  "Mirror examination of the hypopharynx/larynx shows a normal-appearing epiglottis. Aryepiglottic folds look normal. Could not see the vocal cords themselves"   Pain issues, if any: He reports mild pain to his throat. He tells when his throat is dry after breathing through his mouth.   No SOB.  Using a feeding tube?: N/A Weight changes, if any:  Wt Readings from Last 3 Encounters:  07/22/19 137 lb 12.8 oz (62.5 kg)  05/04/19 139 lb 1.6 oz (63.1 kg)  04/06/19 133 lb (60.3 kg)   Swallowing issues, if any: He reports some difficulty swallowing. He has just begun eating more solid foods instead of mashed potatoes, pudding. He is drinking about 4 ensures daily.  Smoking or chewing tobacco? He denies.   ALLERGIES:  has No Known Allergies.  Meds: Current Outpatient Medications  Medication Sig Dispense Refill  . acetaminophen (TYLENOL) 500 MG tablet Take 1,000 mg by mouth every 6 (six) hours as needed for moderate pain or headache.    Marland Kitchen aspirin EC 325 MG tablet Take 650 mg by mouth daily as needed for moderate pain.    Marland Kitchen atropine 1 % ophthalmic solution Place 1 drop into both eyes 2 (two) times a day.    . benzonatate  (TESSALON) 200 MG capsule Take 1 capsule (200 mg total) by mouth 3 (three) times daily as needed for cough. 60 capsule 3  . cetirizine (ZYRTEC) 10 MG tablet Take 10 mg by mouth daily as needed for allergies.    Marland Kitchen docusate sodium (COLACE) 100 MG capsule Take 2 capsules (200 mg total) by mouth daily. To prevent constipation while on pain meds. 150 capsule 1  . gabapentin (NEURONTIN) 300 MG capsule Take 1 capsule (300 mg total) by mouth 3 (three) times daily. 90 capsule 0  . HYDROcodone-acetaminophen (HYCET) 7.5-325 mg/15 ml solution Take 10-15 mLs by mouth every 4 (four) hours as needed for moderate pain. 473 mL 0  . ibuprofen (ADVIL) 200 MG tablet Take 400 mg by mouth every 6 (six) hours as needed for headache or moderate pain.    Marland Kitchen latanoprost (XALATAN) 0.005 % ophthalmic solution Place 1 drop into both eyes at bedtime.     . lidocaine (XYLOCAINE) 2 % solution Patient: Mix 1part 2% viscous lidocaine, 1part H20. Swallow 67mL of diluted mixture, 98min before meals and at bedtime, up to QID 100 mL 5  . prednisoLONE acetate (PRED FORTE) 1 % ophthalmic suspension Place 1 drop into the right eye 3 (three) times daily.     Marland Kitchen tolterodine (DETROL LA) 4 MG 24 hr capsule Take 4 mg by mouth daily.     No current facility-administered medications for  this encounter.     Physical Findings: The patient is in no acute distress. Patient is alert and oriented.  Vitals:   07/22/19 1607  BP: (!) 133/96  Pulse: 74  Temp: 98.7 F (37.1 C)  SpO2: 99%    Wt Readings from Last 3 Encounters:  07/22/19 137 lb 12.8 oz (62.5 kg)  05/04/19 139 lb 1.6 oz (63.1 kg)  04/06/19 133 lb (60.3 kg)    weight is 137 lb 12.8 oz (62.5 kg). His tympanic temperature is 98.7 F (37.1 C). His blood pressure is 133/96 (abnormal) and his pulse is 74. His oxygen saturation is 99%. .  General: Alert and oriented, in no acute distress HEENT: Head is normocephalic.   Oropharynx is notable for healing mucosa, no thrush. Neck: Neck is  notable for healing skin/ resolving peeling Skin: Skin in treatment fields shows satisfactory healing  Psychiatric: Judgment and insight are intact. Affect is appropriate.   Lab Findings: Lab Results  Component Value Date   WBC 3.8 (L) 05/04/2019   HGB 14.0 05/04/2019   HCT 42.7 05/04/2019   MCV 98.2 05/04/2019   PLT 152 05/04/2019    Lab Results  Component Value Date   TSH 0.640 05/04/2019    Radiographic Findings: No results found.  Impression/Plan:    1) Head and Neck Cancer Status: healing from RT  2) Nutritional Status: stable Wt Readings from Last 3 Encounters:  07/22/19 137 lb 12.8 oz (62.5 kg)  05/04/19 139 lb 1.6 oz (63.1 kg)  04/06/19 133 lb (60.3 kg)    3) Risk Factors: The patient has been educated about risk factors including alcohol and tobacco abuse; they understand that avoidance of alcohol and tobacco is important to prevent recurrences as well as other cancers  4) Swallowing: continue SLP, functional  5) Thyroid function:  Check annually  Lab Results  Component Value Date   TSH 0.640 05/04/2019    6)  Follow-up in 2.5-3 months w/ repeat PET imaging. The patient was encouraged to call with any issues or questions before then.  I spent 10 minutes minutes face to face with the patient and more than 50% of that time was spent in counseling and/or coordination of care. _____________________________________   [name], MD  This document serves as a record of services personally performed by [name], MD. It was created on her behalf by Wilburn Mylar, a trained medical scribe. The creation of this record is based on the scribe's personal observations and the provider's statements to them. This document has been checked and approved by the attending provider.

## 2019-07-25 ENCOUNTER — Encounter: Payer: Self-pay | Admitting: Radiation Oncology

## 2019-07-25 ENCOUNTER — Other Ambulatory Visit: Payer: Self-pay | Admitting: Radiation Oncology

## 2019-07-25 DIAGNOSIS — C4442 Squamous cell carcinoma of skin of scalp and neck: Secondary | ICD-10-CM

## 2019-07-25 DIAGNOSIS — C321 Malignant neoplasm of supraglottis: Secondary | ICD-10-CM

## 2019-07-26 ENCOUNTER — Inpatient Hospital Stay: Payer: Medicare HMO | Attending: Hematology | Admitting: Nutrition

## 2019-07-26 NOTE — Progress Notes (Signed)
Nutrition follow up completed with patient over the telephone. Last weight documented as 137.8 pounds. Reports drinking Ensure daily mixed with ice cream.  States he still has occasional nausea and is taking his nausea medication. Reports he is swallowing better. Has no concerns.  Food and Nutrition Related Knowledge Deficit resolved.  Encouraged patient to continue increased calorie and protein intake. Contact me when he needs more ensure enlive. No further follow up scheduled.

## 2019-08-03 ENCOUNTER — Telehealth: Payer: Self-pay | Admitting: *Deleted

## 2019-08-03 NOTE — Telephone Encounter (Signed)
Oncology Nurse Navigator Documentation  In support of appt compliance for H&N pt follow-up with SLP Garald Balding, called patient, spoke with caregiver/friend Verdene Lennert, confirmed understanding of tomorrow's 1:15 appt at [facility], encouraged arrival 15-20 minutes prior to appt for registration and arrival to Radiation Waiting.    Gayleen Orem, RN, BSN Head & Neck Oncology Nurse [place] at [place] [phone]

## 2019-08-04 ENCOUNTER — Other Ambulatory Visit: Payer: Self-pay

## 2019-08-04 ENCOUNTER — Ambulatory Visit: Payer: Medicare HMO | Attending: Radiation Oncology

## 2019-08-04 DIAGNOSIS — R1310 Dysphagia, unspecified: Secondary | ICD-10-CM | POA: Insufficient documentation

## 2019-08-04 NOTE — Therapy (Signed)
[facility] [address] [place], [place], [address] Phone: [phone]   Fax:  [phone]  Speech Language Pathology Treatment  Patient Details  Name: [name] MRN: [id number] Date of Birth: 1951/01/17 Referring Provider (SLP): [name], MD   Encounter Date: 08/04/2019  End of Session - 08/04/19 1343    Visit Number  4    Number of Visits  4    Date for SLP Re-Evaluation  08/05/19    SLP Start Time  7628    SLP Stop Time   1340    SLP Time Calculation (min)  33 min    Activity Tolerance  Patient tolerated treatment well       Past Medical History:  Diagnosis Date  . Diabetes mellitus without complication (HCC)    no meds  . ETOH abuse   . Frequent urination   . Glaucoma   . Hepatitis C virus infection without hepatic coma    dx'ed in 11/2018  . Wears denture    upper only; lost lower denture    Past Surgical History:  Procedure Laterality Date  . ANKLE SURGERY  2011   right ankle  . COLONOSCOPY  02/2019   polyps - Dr Havery Moros  . DIRECT LARYNGOSCOPY N/A 04/01/2019   Procedure: DIRECT LARYNGOSCOPY WITH BIOPSY;  Surgeon: Jodi Marble, MD;  Location: [place] [facility];  Service: ENT;  Laterality: N/A;  . ESOPHAGOSCOPY N/A 04/01/2019   Procedure: ESOPHAGOSCOPY;  Surgeon: Jodi Marble, MD;  Location: [facility];  Service: ENT;  Laterality: N/A;  . EYE SURGERY Right   . KNEE SURGERY    . LARYNGOSCOPY AND BRONCHOSCOPY N/A 04/01/2019   Procedure: BRONCHOSCOPY;  Surgeon: Jodi Marble, MD;  Location: [facility];  Service: ENT;  Laterality: N/A;    There were no vitals filed for this visit.  Subjective Assessment - 08/04/19 1311    Subjective  Dr told  me I needed to drink more water - mouth been dry." Pt has had some dys II items and dys III items last few days.    Currently in Pain?  No/denies            ADULT SLP TREATMENT - 08/04/19 1313      General Information   Behavior/Cognition   Alert;Cooperative;Pleasant mood      Treatment Provided   Treatment provided  Dysphagia      Dysphagia Treatment   Oral Cavity - Dentition  Dentures, top   endentulous on bottom due to misplaced   Patient observed directly with PO's  Yes    Type of PO's observed  Dysphagia 3 (soft);Thin liquids    Oral Phase Signs & Symptoms  Prolonged mastication    Pharyngeal Phase Signs & Symptoms  --   none noted   Other treatment/comments  Pt has had mostly dys II and dys III items and has attempted pork - llittle bites. No overt s/s aspiration with [place] bites x5 and water. Pt described swallowing exercises he has been doing and one was maybe close to the procedure for efforfful swallow but not exact; Other exercises pt described/demonstrated were unrecognizable from the SLP-prescribed exercises on his HEP. Pt admitted to doing HEP 1-2 times a week. Pt req'd mod-max A consistently (on all but effortful swallow). Pt told SLP rationale for HEP without cues necessary. SLP reiterated pt must do HEP as prescribed to privide best chance for WNL swallowing to persist.       Assessment / Recommendations / Plan  Plan  Continue with current plan of care   if pt remains safe with POs but not doing HEP, d/c from ST     Progression Toward Goals   Progression toward goals  Not progressing toward goals (comment)   limited/no compliance with HEP frequency      SLP Education - 08/04/19 1343    Education Details  must do HEP as prescribed, HEP procedure    Person(s) Educated  Patient    Methods  Explanation;Demonstration;Verbal cues;Handout    Comprehension  Verbalized understanding;Returned demonstration;Verbal cues required;Need further instruction       SLP Short Term Goals - 06/30/19 1028      SLP [place] II #1   Title  pt will demo understanding of correct procedure for HEP    Period  --   visits (visit #3), for all STGs   Status  Not Met      SLP SHORT TERM GOAL #2   Title  pt will tell SLP  why he is completing HEP    Status  Partially Met       SLP Long Term Goals - 08/04/19 1346      SLP LONG TERM GOAL #1   Title  pt will demo understanding of proper procedure for HEP over two sessions    Period  --   visits, for all LTGs (visit #4)   Status  Not Met      SLP LONG TERM GOAL #2   Title  pt will tell SLP why a food journal can expediate return to diet most like pre-cancer diet    Time  1    Status  Deferred   and ongoing to January 2021 sessions      Plan - 08/04/19 1344    Clinical Impression Statement  Pt presents today with swallowing ability WNL for dys III/thin liquids. SLP continues to ? pt's compliance with HEP as he req'd mod-max A with all exercies except effortful swallow. SLP reitereated the rationale for HEP after pt told SLP independently. Discharge after next visit in Januray 2021 if pt remains safe with POs but is not doing HEP as recommended. The probability for incr'd dysphagia during and after radiation therapy increases as pt progresses through his rad tx, and skilled ST is recommended to regularly assess pt's safety with POs as well as assess pt's procedure with prescribed HEP. Visits will take place via virtual session or in-person sessions.    Speech Therapy Frequency  --   once every approx 4 weeks   Duration  --   4 total visits   Treatment/Interventions  Aspiration precaution training;Pharyngeal strengthening exercises;Diet toleration management by SLP;Internal/external aids;Patient/family education;Trials of upgraded texture/liquids;SLP instruction and feedback    Potential to Achieve Goals  Good    SLP Home Exercise Plan  provided today    Consulted and Agree with Plan of Care  Patient       Patient will benefit from skilled therapeutic intervention in order to improve the following deficits and impairments:   Dysphagia, unspecified type    Problem List Patient Active Problem List   Diagnosis Date Noted  . Chronic hepatitis C without  hepatic coma ([place]) 06/09/2019  . 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 Squamous cell carcinoma of neck 04/01/2019    [facility] ,MS, CCC-SLP  08/04/2019, 1:47 PM  [place] [address] [place], [place], [address] Phone: [phone]   Fax:  [phone]  Name: [name] MRN: [id number] Date of Birth: 09-04-1951

## 2019-08-31 ENCOUNTER — Telehealth: Payer: Self-pay | Admitting: Family

## 2019-08-31 DIAGNOSIS — B182 Chronic viral hepatitis C: Secondary | ICD-10-CM

## 2019-08-31 NOTE — Telephone Encounter (Signed)
[name] has Genotype 1a Chronic Hepatitis C with viral load of 2.12 million (March 2020). He has APRI score of 1.003 and FIB-4 of 3.34 which is borderline for fibrosis. He will need additional blood work completed prior to treatment. I have placed lab work orders so he may complete them at his convenience.

## 2019-09-01 NOTE — Telephone Encounter (Signed)
Thanks

## 2019-09-01 NOTE — Telephone Encounter (Signed)
[facility] Patient Advocate Encounter  Spoke with the patient's caretaker and she will have him arranged with transportation to get the necessary labs on 12/22 at 11:15am. Will follow-up patient at the appointment to make sure the information we have is still accurate. He has [employer] that remains active.

## 2019-09-05 ENCOUNTER — Ambulatory Visit: Payer: Medicare HMO

## 2019-09-06 ENCOUNTER — Other Ambulatory Visit: Payer: Medicare HMO

## 2019-09-06 ENCOUNTER — Other Ambulatory Visit: Payer: Self-pay

## 2019-09-06 DIAGNOSIS — B182 Chronic viral hepatitis C: Secondary | ICD-10-CM

## 2019-09-12 LAB — HEPATITIS C RNA QUANTITATIVE
HCV Quantitative Log: 6.91 Log IU/mL — ABNORMAL HIGH
HCV RNA, PCR, QN: 8210000 IU/mL — ABNORMAL HIGH

## 2019-09-12 LAB — LIVER FIBROSIS, FIBROTEST-ACTITEST
ALT: 53 U/L — ABNORMAL HIGH (ref 9–46)
Alpha-2-Macroglobulin: 523 mg/dL — ABNORMAL HIGH (ref 106–279)
Apolipoprotein A1: 226 mg/dL — ABNORMAL HIGH (ref 94–176)
Bilirubin: 0.8 mg/dL (ref 0.2–1.2)
Fibrosis Score: 0.88
GGT: 165 U/L — ABNORMAL HIGH (ref 3–70)
Haptoglobin: 37 mg/dL — ABNORMAL LOW (ref 43–212)
Necroinflammat ACT Score: 0.52
Reference ID: 3209802

## 2019-09-12 LAB — CBC
HCT: 41.5 % (ref 38.5–50.0)
Hemoglobin: 14.4 g/dL (ref 13.2–17.1)
MCH: 31.6 pg (ref 27.0–33.0)
MCHC: 34.7 g/dL (ref 32.0–36.0)
MCV: 91.2 fL (ref 80.0–100.0)
MPV: 12.3 fL (ref 7.5–12.5)
Platelets: 184 10*3/uL (ref 140–400)
RBC: 4.55 10*6/uL (ref 4.20–5.80)
RDW: 13 % (ref 11.0–15.0)
WBC: 3.5 10*3/uL — ABNORMAL LOW (ref 3.8–10.8)

## 2019-09-12 LAB — HEPATIC FUNCTION PANEL
AG Ratio: 1.1 (calc) (ref 1.0–2.5)
ALT: 53 U/L — ABNORMAL HIGH (ref 9–46)
AST: 49 U/L — ABNORMAL HIGH (ref 10–35)
Albumin: 4 g/dL (ref 3.6–5.1)
Alkaline phosphatase (APISO): 48 U/L (ref 35–144)
Bilirubin, Direct: 0.3 mg/dL — ABNORMAL HIGH (ref 0.0–0.2)
Globulin: 3.8 g/dL (calc) — ABNORMAL HIGH (ref 1.9–3.7)
Indirect Bilirubin: 0.7 mg/dL (calc) (ref 0.2–1.2)
Total Bilirubin: 1 mg/dL (ref 0.2–1.2)
Total Protein: 7.8 g/dL (ref 6.1–8.1)

## 2019-09-12 LAB — PROTIME-INR
INR: 1.1
Prothrombin Time: 11.5 s (ref 9.0–11.5)

## 2019-09-12 LAB — HIV ANTIBODY (ROUTINE TESTING W REFLEX): HIV 1&2 Ab, 4th Generation: NONREACTIVE

## 2019-09-22 ENCOUNTER — Telehealth: Payer: Self-pay | Admitting: Family

## 2019-09-22 DIAGNOSIS — B182 Chronic viral hepatitis C: Secondary | ICD-10-CM

## 2019-09-22 MED ORDER — SOFOSBUVIR-VELPATASVIR 400-100 MG PO TABS: 1.0000 | ORAL_TABLET | Freq: Every day | ORAL | 2 refills | Status: DC

## 2019-09-22 NOTE — Telephone Encounter (Signed)
Mr. Weidner has Genotype 1a Hepatitis C of 8.2 million with fibrosis score ranging from F0/F1 on elastography to F4 on Fibrotest. APRI score of 0.666 and FIB4 of 2.41. At worst appears to have compensated cirrhosis. Will plan for 12 weeks of Epclusa. Follow up pending approval of medication.

## 2019-09-26 ENCOUNTER — Telehealth: Payer: Self-pay | Admitting: Pharmacy Technician

## 2019-09-26 NOTE — Telephone Encounter (Addendum)
[facility] Patient Advocate Encounter   Received notification from [facility] that prior authorization for [name] is required.   PA submitted on 09/26/2019 Key [id number] Status is pending    [facility] will continue to follow.  Bartholomew Crews, CPhT Specialty Pharmacy Patient [facility] Phone: [phone] Fax: [phone] 09/26/2019 11:28 AM

## 2019-09-26 NOTE — Telephone Encounter (Addendum)
[facility] Patient Advocate Encounter  Prior Authorization for [name] has been approved. Requires brand medication.    Effective dates: 09/26/2019 through 12/19/2019  Patients co-pay is $3.64   [facility] will continue to follow and see if the patient wants the medication mailed or picked up from [facility].  [name], CPhT Specialty Pharmacy Patient [facility] Phone: [phone] Fax: [phone] 09/26/2019 3:32 PM

## 2019-09-27 ENCOUNTER — Telehealth: Payer: Self-pay | Admitting: Pharmacist

## 2019-09-27 ENCOUNTER — Encounter: Payer: Self-pay | Admitting: Pharmacy Technician

## 2019-09-27 MED FILL — EPCLUSA 400 MG-100 MG TAB: 400-100 | 28 days supply | Qty: 28 | Fill #0

## 2019-09-27 NOTE — Telephone Encounter (Signed)
[facility] Patient Advocate Encounter   I was successful in securing patient a $30,000 grant from [employer] (PAF) to provide copayment coverage for Epclusa.     I have spoken with the patient and he will have the medication mailed to the home due to transportation concerns.    The billing information is as follows and has been shared with [place].   RxBin: [id number] PCN: [id number] Member ID: [id number] Group ID: [id number] Dates of Eligibility: 08/28/2019 through 08/26/2020  Patient knows to call the office with questions or concerns.  Bartholomew Crews, CPhT Specialty Pharmacy Patient [facility] Phone: [phone] Fax: [phone] 09/27/2019 10:55 AM

## 2019-09-27 NOTE — Telephone Encounter (Signed)
Thank you, Dorothea Ogle!

## 2019-10-03 ENCOUNTER — Telehealth: Payer: Self-pay | Admitting: *Deleted

## 2019-10-03 NOTE — Telephone Encounter (Signed)
CALLED PATIENT TO INFORM OF PET SCAN FOR 10-13-19 - ARRIVAL TIME- 9:30 AM @ [facility], PATIENT TO BE NPO- AFTER MIDNIGHT, PATIENT WILL RECEIVE HIS RESULTS FROM DR. SQUIRE ON 10-14-19 @ 11:40 AM, SPOKE WITH PATIENT'S SISTER - DAISY MICHAEL AND SHE IS AWARE OF THESE APPTS.

## 2019-10-05 ENCOUNTER — Telehealth: Payer: Self-pay | Admitting: *Deleted

## 2019-10-05 NOTE — Telephone Encounter (Signed)
Oncology Nurse Navigator Documentation  In support of appointment compliance for H&N patient follow-up with SLP Garald Balding, spoke with [name]' friend/caregiver Verdene Lennert, confirmed understanding of tomorrow's 1:15 appointment at [facility], encouraged arrival 15-20 minutes prior for registration followed by arrival to Radiation Waiting.  She voiced understanding.  Gayleen Orem, RN, BSN Head & Neck Oncology Nurse [place] at [place] [phone]

## 2019-10-06 ENCOUNTER — Other Ambulatory Visit: Payer: Self-pay

## 2019-10-06 ENCOUNTER — Ambulatory Visit: Payer: Medicare HMO | Attending: Radiation Oncology

## 2019-10-06 DIAGNOSIS — R1310 Dysphagia, unspecified: Secondary | ICD-10-CM | POA: Diagnosis present

## 2019-10-06 NOTE — Patient Instructions (Signed)
SWALLOWING EXERCISES Do these daily until 6 months after your last day of radiation, then 2 times per week afterwards  1. Effortful Swallows - Press your tongue against the roof of your mouth for 3 seconds, then squeeze          the muscles in your neck while you swallow your saliva or a sip of water - Repeat 10-15 times, 2-3 times a day, and use whenever you eat or drink  2. [place] Swallow - swallow with your tongue sticking out - Stick tongue out past your teeth and gently bite tongue with your teeth - Swallow, while holding your tongue with your teeth - Repeat 10-15 times, 2-3 times a day *use a wet spoon if your mouth gets dry*  3. Mendelsohn Maneuver - "half swallow" exercise - Start to swallow, and keep your Adam's apple up by squeezing hard with the            muscles of the throat - Hold the squeeze for 5-7 seconds and then relax - Repeat 10-15 times, 2-3 times a day *use a wet spoon if your mouth gets dry*  4. Chin pushback - Open your mouth  - Place your fist UNDER your chin near your neck, and push back with your fist for 5 seconds - Repeat 10 times, 2-3 times a day        5.  Super Swallow             -Take a breath and hold it.             - Bear down             - Swallow and immediately cough             -repeat 10 times, twice a day

## 2019-10-06 NOTE — Therapy (Signed)
[facility] [address] [place], [place], [address] Phone: [phone]   Fax:  [phone]  Speech Language Pathology Treatment/ Renewal- Discharge  Patient Details  Name: [name] MRN: [id number] Date of Birth: [birth year] Referring Provider (SLP): [name], MD   Encounter Date: 10/06/2019  End of Session - 10/06/19 1606    Visit Number  5    Number of Visits  5    Date for SLP Re-Evaluation  10/06/19    SLP Start Time  4008    SLP Stop Time   6761    SLP Time Calculation (min)  34 min    Activity Tolerance  Patient tolerated treatment well       Past Medical History:  Diagnosis Date  . Diabetes mellitus without complication (HCC)    no meds  . ETOH abuse   . Frequent urination   . Glaucoma   . Hepatitis C virus infection without hepatic coma    dx'ed in 11/2018  . Wears denture    upper only; lost lower denture    Past Surgical History:  Procedure Laterality Date  . ANKLE SURGERY  2011   right ankle  . COLONOSCOPY  02/2019   polyps - Dr Havery Moros  . DIRECT LARYNGOSCOPY N/A 04/01/2019   Procedure: DIRECT LARYNGOSCOPY WITH BIOPSY;  Surgeon: Jodi Marble, MD;  Location: [facility];  Service: ENT;  Laterality: N/A;  . ESOPHAGOSCOPY N/A 04/01/2019   Procedure: ESOPHAGOSCOPY;  Surgeon: Jodi Marble, MD;  Location: [facility];  Service: ENT;  Laterality: N/A;  . EYE SURGERY Right   . KNEE SURGERY    . LARYNGOSCOPY AND BRONCHOSCOPY N/A 04/01/2019   Procedure: BRONCHOSCOPY;  Surgeon: Jodi Marble, MD;  Location: [facility];  Service: ENT;  Laterality: N/A;    There were no vitals filed for this visit.  Subjective Assessment - 10/06/19 1325    Subjective  Pt eaten port roast, cabbage, corn, pinto beans, sandwiches    Currently in Pain?  No/denies            ADULT SLP TREATMENT - 10/06/19 1326      General Information   Behavior/Cognition  Alert;Cooperative;Pleasant mood      Treatment Provided    Treatment provided  Dysphagia      Dysphagia Treatment   Temperature Spikes Noted  No    Respiratory Status  Room air    Oral Cavity - Dentition  Dentures, top    Treatment Methods  Skilled observation;Therapeutic exercise;Patient/caregiver education    Patient observed directly with PO's  Yes    Type of PO's observed  Dysphagia 3 (soft);Thin liquids    Oral Phase Signs & Symptoms  Prolonged mastication   due to endentulous status - lost his top plate last week   Pharyngeal Phase Signs & Symptoms  Other (comment)   none noted 3 bites sandwich, gulping water   Type of cueing  Verbal;Visual   demo   Amount of cueing  --   mod-max, usually   Other treatment/comments  Pt reports exercises have been more frequent since last visit "about every other day" pt stated. Pt demonstrated one exercise correctly, but SLP had to provide usual mod-max cues so SLP again questions pt's adherence to frequency of HEP completion and scope of HEP completion. Pt remains safe with POs at this time and education was completed using teach back method about what to do if pt experiences coughing, "strangling", or decr'd pharyngeal clearance with POs in  the future. Pt was asked if he had any questions and denied any questions for SLP at tihs time. SLP referred pt to telephone number of SLP on his exercise paper if he should think of any.      Assessment / Recommendations / Plan   Plan  Discharge SLP treatment due to (comment)   pt safe with POs, still noncompliant with HEP      SLP Education - 10/06/19 1605    Education Details  HEP procedure, entire HEP must be done BID to give pt best chance to have WNL swallowing    Person(s) Educated  Patient    Methods  Explanation;Demonstration;Verbal cues;Handout    Comprehension  Verbalized understanding;Returned demonstration;Verbal cues required       SLP Short Term Goals - 06/30/19 1028      SLP SHORT TERM GOAL #1   Title  pt will demo understanding of correct  procedure for HEP    Period  --   visits (visit #3), for all STGs   Status  Not Met      SLP SHORT TERM GOAL #2   Title  pt will tell SLP why he is completing HEP    Status  Partially Met       SLP Long Term Goals - 10/06/19 1608      SLP LONG TERM GOAL #1   Title  pt will demo understanding of proper procedure for HEP over two sessions    Period  --   visits, for all LTGs (visit #4)   Status  Not Met   remains unmet 10-06-19     SLP LONG TERM GOAL #2   Title  pt will tell SLP why a food journal can expediate return to diet most like pre-cancer diet    Time  1    Status  Deferred   and ongoing to January 2021 sessions      Plan - 10/06/19 1606    Clinical Impression Statement  Pt presents today with swallowing ability continued WNL for dys III/thin liquids, 3 months post - radiation tx. SLP continues to ? pt's compliance with HEP as he req'd mod-max A with all exercies except one today. SLP again, like previous session, reitereated the rationale for HEP completion. See "other comments" for more details re: today's session. Pt agrees with d/c today.    Speech Therapy Frequency  --   today's visit, then d/c   Treatment/Interventions  Aspiration precaution training;Pharyngeal strengthening exercises;Diet toleration management by SLP;Internal/external aids;Patient/family education;Trials of upgraded texture/liquids;SLP instruction and feedback    Potential to Achieve Goals  Good    SLP Home Exercise Plan  provided today    Consulted and Agree with Plan of Care  Patient       Patient will benefit from skilled therapeutic intervention in order to improve the following deficits and impairments:   Dysphagia, unspecified type   SPEECH THERAPY DISCHARGE SUMMARY  Visits from Start of Care: 5  Current functional level related to goals / functional outcomes: Pt arrived today for ST and SLP suspects cont'd limited compliance with HEP scope and frequency based upon his performance  today. He remains safe with POs (no overt s/sx aspiration), without overt s/sx aspiration PNA.  Pt will be seen today with above goals and will be d/c'd.   Remaining deficits: None noted.   Education / Equipment: HEP procedure, need to do HEP as prescribed for best outcomes re: swallowing, what to do should he have coughing, "strangling"  or reduced pharyngeal clearance with POs.   Plan: Patient agrees to discharge.  Patient goals were partially met. Patient is being discharged due to                                                     ?????pt remains with limited compliance for HEP completion and yet is safe with POs without signs of difficulty.       Problem List Patient Active Problem List   Diagnosis Date Noted  . Chronic hepatitis C without hepatic coma ([facility]) 06/09/2019  . 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 Squamous cell carcinoma of neck 04/01/2019    [facility] ,MS, CCC-SLP  10/06/2019, 4:09 PM  [facility] [address] [place], [place], [address] Phone: [phone]   Fax:  [phone]   Name: CANDICE LUNNEY MRN: [id number] Date of Birth: 02-03-1951

## 2019-10-13 ENCOUNTER — Encounter (HOSPITAL_COMMUNITY): Admission: RE | Admit: 2019-10-13 | Payer: Medicare HMO | Source: Ambulatory Visit

## 2019-10-14 ENCOUNTER — Ambulatory Visit: Payer: Self-pay | Admitting: Radiation Oncology

## 2019-10-17 ENCOUNTER — Encounter (HOSPITAL_COMMUNITY): Payer: Self-pay

## 2019-10-17 ENCOUNTER — Ambulatory Visit (HOSPITAL_COMMUNITY): Payer: Medicare HMO

## 2019-10-18 ENCOUNTER — Telehealth: Payer: Self-pay | Admitting: *Deleted

## 2019-10-18 NOTE — Telephone Encounter (Signed)
Called patient to inform that fu appt. has been moved to 10-28-19 due to his Pet Scan being moved to 10-27-19, lvm for a return call

## 2019-10-18 NOTE — Telephone Encounter (Signed)
CALLED PATIENT TO INFORM THAT FU APPT. HAS BEEN MOVED TO 10-28-19 @ 9:40 AM WITH DR. Isidore Moos FOR RESULTS OF PET FROM 10-27-19, SPOKE WITH PATIENT AND HE IS AWARE OF THIS AND IS GOOD WITH THIS

## 2019-10-19 ENCOUNTER — Ambulatory Visit: Payer: Medicare HMO | Admitting: Radiation Oncology

## 2019-10-25 NOTE — Progress Notes (Signed)
HPI: [name] is a 69 y.o. male who presents to the [facility] for Hepatitis C follow-up.  Medication: Raeanne Gathers  Start Date: 09/28/2019 documented, possibly started 1 week later. Patient does not remember exact start date.   Hepatitis C Genotype: genotype 1a  Fibrosis Score: F4  Hepatitis C RNA: 8.21 million  Patient Active Problem List   Diagnosis Date Noted  . Chronic hepatitis C without hepatic coma ([facility]) 06/09/2019  . 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 Squamous cell carcinoma of neck 04/01/2019    Patient's Medications  New Prescriptions   No medications on file  Previous Medications   ACETAMINOPHEN (TYLENOL) 500 MG TABLET    Take 1,000 mg by mouth every 6 (six) hours as needed for moderate pain or headache.   ASPIRIN EC 325 MG TABLET    Take 650 mg by mouth daily as needed for moderate pain.   ATROPINE 1 % OPHTHALMIC SOLUTION    Place 1 drop into both eyes 2 (two) times a day.   BENZONATATE (TESSALON) 200 MG CAPSULE    Take 1 capsule (200 mg total) by mouth 3 (three) times daily as needed for cough.   CETIRIZINE (ZYRTEC) 10 MG TABLET    Take 10 mg by mouth daily as needed for allergies.   DOCUSATE SODIUM (COLACE) 100 MG CAPSULE    Take 2 capsules (200 mg total) by mouth daily. To prevent constipation while on pain meds.   GABAPENTIN (NEURONTIN) 300 MG CAPSULE    Take 1 capsule (300 mg total) by mouth 3 (three) times daily.   HYDROCODONE-ACETAMINOPHEN (HYCET) 7.5-325 MG/15 ML SOLUTION    Take 10-15 mLs by mouth every 4 (four) hours as needed for moderate pain.   IBUPROFEN (ADVIL) 200 MG TABLET    Take 400 mg by mouth every 6 (six) hours as needed for headache or moderate pain.   LATANOPROST (XALATAN) 0.005 % OPHTHALMIC SOLUTION    Place 1 drop into both eyes at bedtime.    LIDOCAINE (XYLOCAINE) 2 % SOLUTION    Patient: Mix 1part 2% viscous lidocaine, 1part H20. Swallow 76mL of diluted mixture, 93min before meals and at  bedtime, up to QID   PREDNISOLONE ACETATE (PRED FORTE) 1 % OPHTHALMIC SUSPENSION    Place 1 drop into the right eye 3 (three) times daily.    SOFOSBUVIR-VELPATASVIR (EPCLUSA) 400-100 MG TABS    Take 1 tablet by mouth daily. Take 1 tablet by mouth daily.   TOLTERODINE (DETROL LA) 4 MG 24 HR CAPSULE    Take 4 mg by mouth daily.  Modified Medications   No medications on file  Discontinued Medications   No medications on file    Allergies: No Known Allergies  Past Medical History: Past Medical History:  Diagnosis Date  . Diabetes mellitus without complication (HCC)    no meds  . ETOH abuse   . Frequent urination   . Glaucoma   . Hepatitis C virus infection without hepatic coma    dx'ed in 11/2018  . Wears denture    upper only; lost lower denture    Social History: Social History   Socioeconomic History  . Marital status: Single    Spouse name: Not on file  . Number of children: 2  . Years of education: Not on file  . Highest education level: Not on file  Occupational History  . Not on file  Tobacco Use  . Smoking status: Former Smoker  Years: 50.00    Types: Cigarettes    Quit date: 03/16/2019    Years since quitting: 0.6  . Smokeless tobacco: Never Used  Substance and Sexual Activity  . Alcohol use: Yes    Comment: occasional  . Drug use: Yes    Types: Cocaine    Comment: none for 6 months  . Sexual activity: Not on file  Other Topics Concern  . Not on file  Social History Narrative   Patient is divorced with 2 children.   Patient is currently living with his sister.   Patient with a history of smoking a third of pack of cigarettes daily for 50 years.  Patient currently smoking 2 to 3 cigarettes/day.   Patient has never used smokeless tobacco.   Patient with occasional use of alcohol.   Patient last used cocaine approximately 6 months ago.  Patient denies use of marijuana.   Social Determinants of Health   Financial Resource Strain:   . Difficulty of  Paying Living Expenses: Not on file  Food Insecurity:   . Worried About Charity fundraiser in the Last Year: Not on file  . Ran Out of Food in the Last Year: Not on file  Transportation Needs: No Transportation Needs  . Lack of Transportation (Medical): No  . Lack of Transportation (Non-Medical): No  Physical Activity:   . Days of Exercise per Week: Not on file  . Minutes of Exercise per Session: Not on file  Stress:   . Feeling of Stress : Not on file  Social Connections:   . Frequency of Communication with Friends and Family: Not on file  . Frequency of Social Gatherings with Friends and Family: Not on file  . Attends Religious Services: Not on file  . Active Member of Clubs or Organizations: Not on file  . Attends Archivist Meetings: Not on file  . Marital Status: Not on file    Labs: Hepatitis C Lab Results  Component Value Date   HCVGENOTYPE 1a 12/13/2018   HEPCAB REACTIVE (A) 11/30/2018   HCVRNAPCRQN 8,210,000 (H) 09/06/2019   HCVRNAPCRQN 2,120,000 (H) 11/30/2018   FIBROSTAGE F4 09/06/2019   Hepatitis B Lab Results  Component Value Date   HEPBSAB NON-REACTIVE 11/30/2018   HEPBSAG NON-REACTIVE 11/30/2018   Hepatitis A Lab Results  Component Value Date   HAV NON-REACTIVE 11/30/2018   HIV Lab Results  Component Value Date   HIV NON-REACTIVE 09/06/2019   Lab Results  Component Value Date   CREATININE 0.74 05/04/2019   CREATININE 0.64 04/01/2019   CREATININE 0.52 (L) 06/19/2018   CREATININE 0.69 05/31/2018   CREATININE 0.71 01/03/2018   Lab Results  Component Value Date   AST 49 (H) 09/06/2019   AST 60 (H) 05/04/2019   AST 38 04/01/2019   ALT 53 (H) 09/06/2019   ALT 53 (H) 09/06/2019   ALT 61 (H) 05/04/2019   INR 1.1 09/06/2019   INR 1.03 01/03/2018   INR 1.19 06/16/2013    Assessment: [name] arrives at clinic for chronic hepatitis C follow-up. He tested positive March 2020 with HCV RNA 2.12 million with F0-F1 fibrosis and genotype 1a.  Risk factors: past IVDU and intranasal cocaine use. He was last seen in clinic 06/09/19 and was supposed to start Harvoni x 8 weeks. The patient was undergoing radiation, so treatment was held.  The patient's hep C worsened with viral load of 8.21 million and fibrosis score of F4 on 09/06/19. Recommended patient's hep C treatment be changed  to Epclusa x 12 weeks due to compensated cirrhosis.   Today Burl[place] reports he has been taking Epclusa for ~3 weeks, which is about 1 week after the recorded start date of 09/28/2019. He says he has missed 2 doses, but they were spaced out. When he remembers to take the medication, he takes it at 7 AM. Natividad reports he is still drinking alcohol occasionally. We are not sure if he is still using cocaine intranasally. Lugene is no longer on radiation and he says he has not started any new medications since he was last seen in clinic. We explained to the patient the importance of not missing any doses of Epclusa and offered a pill box to help with adherence. The patient accepted the pill box.   Plan: - HCV RNA and CMET - Due for influenza and PCV-13 vaccines - Follow-up with Cassie on Thursday, April 29 at [facility], 4th Year PharmD Candidate

## 2019-10-26 ENCOUNTER — Other Ambulatory Visit: Payer: Self-pay

## 2019-10-26 ENCOUNTER — Ambulatory Visit (INDEPENDENT_AMBULATORY_CARE_PROVIDER_SITE_OTHER): Payer: Medicare HMO | Admitting: Pharmacist

## 2019-10-26 DIAGNOSIS — B182 Chronic viral hepatitis C: Secondary | ICD-10-CM | POA: Diagnosis not present

## 2019-10-26 DIAGNOSIS — Z79899 Other long term (current) drug therapy: Secondary | ICD-10-CM

## 2019-10-27 ENCOUNTER — Encounter: Payer: Self-pay | Admitting: Radiation Oncology

## 2019-10-27 ENCOUNTER — Encounter (HOSPITAL_COMMUNITY)
Admission: RE | Admit: 2019-10-27 | Discharge: 2019-10-27 | Disposition: A | Discharge status: Home / Self Care | Payer: Medicare HMO | Source: Ambulatory Visit | Attending: Radiation Oncology | Admitting: Radiation Oncology

## 2019-10-27 DIAGNOSIS — C321 Malignant neoplasm of supraglottis: Secondary | ICD-10-CM

## 2019-10-27 DIAGNOSIS — I700 Atherosclerosis of aorta: Secondary | ICD-10-CM | POA: Diagnosis not present

## 2019-10-27 DIAGNOSIS — I2510 Atherosclerotic heart disease of native coronary artery without angina pectoris: Secondary | ICD-10-CM | POA: Diagnosis not present

## 2019-10-27 LAB — GLUCOSE, CAPILLARY: Glucose-Capillary: 92 mg/dL (ref 70–99)

## 2019-10-27 MED ORDER — FLUDEOXYGLUCOSE F - 18 (FDG) INJECTION
7.8000 | Freq: Once | INTRAVENOUS | Status: AC
  Administered 2019-10-27: 13:00:00 7.8 via INTRAVENOUS

## 2019-10-28 ENCOUNTER — Telehealth: Payer: Self-pay | Admitting: *Deleted

## 2019-10-28 ENCOUNTER — Ambulatory Visit
Admission: RE | Admit: 2019-10-28 | Discharge: 2019-10-28 | Disposition: A | Discharge status: Home / Self Care | Payer: Medicare HMO | Source: Ambulatory Visit | Attending: Radiation Oncology | Admitting: Radiation Oncology

## 2019-10-28 DIAGNOSIS — C321 Malignant neoplasm of supraglottis: Secondary | ICD-10-CM

## 2019-10-28 HISTORY — DX: Personal history of irradiation: Z92.3

## 2019-10-28 MED ORDER — LIDOCAINE VISCOUS HCL 2 % MT SOLN: OROMUCOSAL | 3 refills | Status: DC

## 2019-10-28 MED FILL — EPCLUSA 400 MG-100 MG TAB: 400-100 | 28 days supply | Qty: 28 | Fill #1

## 2019-10-28 NOTE — Telephone Encounter (Signed)
Called patient to inform of lab on 01-31-20 @ 1:30 pm @ [facility] and his fu visit with Dr. Isidore Moos on 01-31-20 @ 2 pm, spoke with patient and he is aware of these appts.

## 2019-10-28 NOTE — Progress Notes (Signed)
Radiation Oncology         [phone] ________________________________  Name: [name] MRN: [id number]  Date: 10/28/2019  DOB: 15-Mar-1951  Follow-Up Visit Note by telephone as patient was unable to access MyChart video during pandemic precautions  CC: [name], MD  Izora Gala, MD  Diagnosis and Prior Radiotherapy:       ICD-10-CM   1. Carcinoma of supraglottis ([place])  C32.1 lidocaine (XYLOCAINE) 2 % solution    TSH     05/12/2019 through 07/06/2019 Site Technique Total Dose (Gy) Dose per Fx (Gy) Completed Fx Beam Energies  Head & neck: HN_larynx IMRT 70/70 2 35/35 6X   CHIEF COMPLAINT:  Here for follow-up and surveillance of laryngeal cancer  Narrative:  The patient returns today for routine follow-up. He last saw Dr. Erik Obey on [id number], who noted no evidence of recurrence.  He underwent PET scan yesterday, 10/27/2019. This showed: "New FDG avid right level 2 cervical lymph node concerning for recurrent disease. No findings of distant metastatic disease within the chest, abdomen or pelvis. Mild nonspecific increased uptake in the right side of larynx is similar to previous exam."  No SOB.  Weight changes, if any:  Wt Readings from Last 3 Encounters:  07/22/19 137 lb 12.8 oz (62.5 kg)  05/04/19 139 lb 1.6 oz (63.1 kg)  04/06/19 133 lb (60.3 kg)    Swallowing issues, if any: still has some soreness, dryness in throat.  Smoking or chewing tobacco? He denies.   He is taking pills for Hep C, and feels they give him a HA.  Swallowing soft foods, no PEG tube. He is ~130s lb.  Skin is still clearing up, lightening, still applying lotion, will add Vit E oil.  ALLERGIES:  has No Known Allergies.  Meds: Current Outpatient Medications  Medication Sig Dispense Refill  . acetaminophen (TYLENOL) 500 MG tablet Take 1,000 mg by mouth every 6 (six) hours as needed for moderate pain or headache.    Marland Kitchen aspirin EC 325 MG tablet Take 650 mg by mouth daily as needed for  moderate pain.    Marland Kitchen atropine 1 % ophthalmic solution Place 1 drop into both eyes 2 (two) times a day.    . benzonatate (TESSALON) 200 MG capsule Take 1 capsule (200 mg total) by mouth 3 (three) times daily as needed for cough. 60 capsule 3  . cetirizine (ZYRTEC) 10 MG tablet Take 10 mg by mouth daily as needed for allergies.    Marland Kitchen docusate sodium (COLACE) 100 MG capsule Take 2 capsules (200 mg total) by mouth daily. To prevent constipation while on pain meds. 150 capsule 1  . gabapentin (NEURONTIN) 300 MG capsule Take 1 capsule (300 mg total) by mouth 3 (three) times daily. 90 capsule 0  . HYDROcodone-acetaminophen (HYCET) 7.5-325 mg/15 ml solution Take 10-15 mLs by mouth every 4 (four) hours as needed for moderate pain. 473 mL 0  . ibuprofen (ADVIL) 200 MG tablet Take 400 mg by mouth every 6 (six) hours as needed for headache or moderate pain.    Marland Kitchen latanoprost (XALATAN) 0.005 % ophthalmic solution Place 1 drop into both eyes at bedtime.     . lidocaine (XYLOCAINE) 2 % solution Patient: Mix 1part 2% viscous lidocaine, 1part H20. Swallow 43mL of diluted mixture, 40min before meals and at bedtime, up to QID 150 mL 3  . prednisoLONE acetate (PRED FORTE) 1 % ophthalmic suspension Place 1 drop into the right eye 3 (three) times daily.     Marland Kitchen  Sofosbuvir-Velpatasvir (EPCLUSA) 400-100 MG TABS Take 1 tablet by mouth daily. Take 1 tablet by mouth daily. 28 tablet 2  . tolterodine (DETROL LA) 4 MG 24 hr capsule Take 4 mg by mouth daily.     No current facility-administered medications for this encounter.    Physical Findings:    There were no vitals filed for this visit.  Wt Readings from Last 3 Encounters:  07/22/19 137 lb 12.8 oz (62.5 kg)  05/04/19 139 lb 1.6 oz (63.1 kg)  04/06/19 133 lb (60.3 kg)    vitals were not taken for this visit. .  General: Alert and oriented, in no acute distress     Lab Findings: Lab Results  Component Value Date   WBC 3.5 (L) 09/06/2019   HGB 14.4 09/06/2019    HCT 41.5 09/06/2019   MCV 91.2 09/06/2019   PLT 184 09/06/2019    Lab Results  Component Value Date   TSH 0.640 05/04/2019    Radiographic Findings: NM PET Image Restag (PS) Skull Base To Thigh  Result Date: 10/28/2019 CLINICAL DATA:  Subsequent treatment strategy for laryngeal carcinoma. EXAM: NUCLEAR MEDICINE PET SKULL BASE TO THIGH TECHNIQUE: 7.85 mCi F-18 FDG was injected intravenously. Full-ring PET imaging was performed from the skull base to thigh after the radiotracer. CT data was obtained and used for attenuation correction and anatomic localization. Fasting blood glucose: 92 mg/dl COMPARISON:  04/22/2019 FINDINGS: Mediastinal blood pool activity: SUV max 2.47 Liver activity: SUV max NA NECK: FDG avid right level 2 lymph node measures 0.8 cm and has an SUV max of 8.3. Mild asymmetric activity within the right-side of larynx with SUV max of 3.47. Previously 3.99. Incidental CT findings: none CHEST: No FDG avid axillary, supraclavicular, mediastinal, or hilar lymph nodes. Biapical pleuroparenchymal scarring. No FDG avid pulmonary nodule or mass. Incidental CT findings: Lad, left circumflex and RCA coronary artery calcifications. Aortic atherosclerosis. ABDOMEN/PELVIS: No abnormal FDG uptake identified within the liver, pancreas, or spleen. The adrenal glands are unremarkable. No FDG avid lymph nodes within the abdomen or pelvis. No inguinal FDG avid lymph nodes. Incidental CT findings: Aortic atherosclerosis. Diverticulosis is identified involving the colon. SKELETON: No focal hypermetabolic activity to suggest skeletal metastasis. Incidental CT findings: none IMPRESSION: 1. New FDG avid right level 2 cervical lymph node concerning for recurrent disease. No findings of distant metastatic disease within the chest, abdomen or pelvis. 2. Mild nonspecific increased uptake in the right side of larynx is similar to previous exam. 3. Aortic Atherosclerosis (ICD10-I70.0). Coronary artery calcifications.  The Electronically Signed   By: Kerby Moors M.D.   On: 10/28/2019 08:33    Impression/Plan:    1) Head and Neck Cancer Status: I have personally reviewed his images and prior RT plan.  He has no obvious evidence of residual disease in the larynx and no evidence of distant metastatic disease.  He had a mildly suspicious lymph node in level 2 of the right neck that I decided to target a full dose when he received his radiation therapy and now this is lighting up on his PET scan.  See volumes below from his treatment plan.  This lymph node could be lighting up due to inflammation.  We will discuss it at tumor board and determine if a biopsy sooner rather than later or a subsequent PET scan in May is appropriate to work this up further.  He will also see otolaryngology in early March for laryngoscopy to continue monitoring his throat.    2)  Nutritional Status: stable per patient report Wt Readings from Last 3 Encounters:  07/22/19 137 lb 12.8 oz (62.5 kg)  05/04/19 139 lb 1.6 oz (63.1 kg)  04/06/19 133 lb (60.3 kg)    3) Risk Factors: The patient has been educated about risk factors including alcohol and tobacco abuse; they understand that avoidance of alcohol and tobacco is important to prevent recurrences as well as other cancers; he states during the Super Bowl he "partied" and drank ETOH.  He knows to abstain from this point forward to prevent future cancers  4) Swallowing: continue SLP as recommended, functional  5) Thyroid function:  Check annually  Lab Results  Component Value Date   TSH 0.640 05/04/2019    6) Dental: endentulous.  7)  Follow-up in May with me, and in early March with otolaryngology; the patient was encouraged to call with any issues or questions before then.  He knows that we will get back in touch with him about tumor board recommendations if additional appointments are warranted.  For his sore throat I refilled his lidocaine today.  This encounter was  provided by telemedicine platform by telephone as patient was unable to access MyChart video during pandemic precautions The patient has given verbal consent for this type of encounter and has been advised to only accept a meeting of this type in a secure network environment. The time spent during this encounter on date of service, in total, was 40 minutes. The attendants for this meeting include [name]  and [name].  His friend Verdene Lennert was also present. During the encounter, [name] was located at [facility] Radiation Oncology Department.  Jontez E Salon was located at home.   _____________________________________   [name], MD  This document serves as a record of services personally performed by [name], MD. It was created on her behalf by Wilburn Mylar, a trained medical scribe. The creation of this record is based on the scribe's personal observations and the provider's statements to them. This document has been checked and approved by the attending provider.

## 2019-10-30 LAB — COMPREHENSIVE METABOLIC PANEL
AG Ratio: 1.1 (calc) (ref 1.0–2.5)
ALT: 16 U/L (ref 9–46)
AST: 19 U/L (ref 10–35)
Albumin: 3.9 g/dL (ref 3.6–5.1)
Alkaline phosphatase (APISO): 40 U/L (ref 35–144)
BUN/Creatinine Ratio: 15 (calc) (ref 6–22)
BUN: 9 mg/dL (ref 7–25)
CO2: 30 mmol/L (ref 20–32)
Calcium: 9.5 mg/dL (ref 8.6–10.3)
Chloride: 105 mmol/L (ref 98–110)
Creat: 0.61 mg/dL — ABNORMAL LOW (ref 0.70–1.25)
Globulin: 3.4 g/dL (calc) (ref 1.9–3.7)
Glucose, Bld: 114 mg/dL — ABNORMAL HIGH (ref 65–99)
Potassium: 4 mmol/L (ref 3.5–5.3)
Sodium: 141 mmol/L (ref 135–146)
Total Bilirubin: 0.5 mg/dL (ref 0.2–1.2)
Total Protein: 7.3 g/dL (ref 6.1–8.1)

## 2019-10-30 LAB — HEPATITIS C RNA QUANTITATIVE
HCV Quantitative Log: 1.34 Log IU/mL — ABNORMAL HIGH
HCV RNA, PCR, QN: 22 IU/mL — ABNORMAL HIGH

## 2019-11-02 ENCOUNTER — Other Ambulatory Visit: Payer: Self-pay | Admitting: Radiation Oncology

## 2019-11-02 DIAGNOSIS — C321 Malignant neoplasm of supraglottis: Secondary | ICD-10-CM

## 2019-11-02 NOTE — Progress Notes (Signed)
I ordered an FNA of the patient's right neck node that is suspicious on his PET , per tumor board consensus. I left a VM w/ patient's caregiver, Verdene Lennert, about this.  -----------------------------------  Alan Gibson, MD

## 2019-11-03 ENCOUNTER — Encounter (HOSPITAL_COMMUNITY): Payer: Self-pay | Admitting: Radiology

## 2019-11-03 NOTE — Progress Notes (Signed)
[name] Male, 69 y.o., 1951/03/17 MRN:  [id number] Phone:  [phone] [name]) PCP:  [name], MD Primary Cvg:  [employer] Medicare/[employer] Hmo Next Appt With Radiology ([facility]-US 2) 11/21/2019 at 1:00 PM  RE: [place] FNA Soft Tissue Received: Today Message Contents  [name], MD  [name]  OK for US guided right cervical lymph node biopsy.   [name]       Previous Messages   ----- Message -----  From: [name]  Sent: 11/02/2019 12:29 PM EST  To: [facility] Procedure Requests  Subject: [place] FNA Soft Tissue                Procedure: [place] FNA SOFT TISSUE   Reason:  Carcinoma of supraglottis, hypermetabolic node, right neck. Please biopsy to rule in or rule out progressive cancer.   History:  CT, NM PET in computer   Provider: [name]   Provider Contact: [phone]

## 2019-11-04 ENCOUNTER — Telehealth: Payer: Self-pay | Admitting: *Deleted

## 2019-11-04 NOTE — Telephone Encounter (Signed)
Oncology Nurse Navigator Documentation  In follow-up to Wednesday's ENT Conference and recently scheduled 11/21/2019 cervical LN biopsy, sent fax to [facility] Scheduling, requested Mr. Cardiel be contacted to schedule appt with ENT Constance Holster next available after biopsy results available.  Notification of successful fax transmission received.  Gayleen Orem, RN, BSN Head & Neck Oncology Nurse [place] [facility] [place] [phone]

## 2019-11-10 ENCOUNTER — Emergency Department (HOSPITAL_COMMUNITY): Payer: Medicare HMO

## 2019-11-10 ENCOUNTER — Emergency Department (HOSPITAL_COMMUNITY)
Admission: EM | Admit: 2019-11-10 | Discharge: 2019-11-10 | Disposition: A | Discharge status: Home / Self Care | Payer: Medicare HMO | Attending: Emergency Medicine | Admitting: Emergency Medicine

## 2019-11-10 ENCOUNTER — Other Ambulatory Visit: Payer: Self-pay

## 2019-11-10 DIAGNOSIS — R0789 Other chest pain: Secondary | ICD-10-CM | POA: Diagnosis not present

## 2019-11-10 DIAGNOSIS — Z87891 Personal history of nicotine dependence: Secondary | ICD-10-CM | POA: Insufficient documentation

## 2019-11-10 DIAGNOSIS — E119 Type 2 diabetes mellitus without complications: Secondary | ICD-10-CM | POA: Insufficient documentation

## 2019-11-10 DIAGNOSIS — R110 Nausea: Secondary | ICD-10-CM | POA: Insufficient documentation

## 2019-11-10 DIAGNOSIS — R109 Unspecified abdominal pain: Secondary | ICD-10-CM

## 2019-11-10 DIAGNOSIS — Z79899 Other long term (current) drug therapy: Secondary | ICD-10-CM | POA: Diagnosis not present

## 2019-11-10 DIAGNOSIS — C4442 Squamous cell carcinoma of skin of scalp and neck: Secondary | ICD-10-CM | POA: Diagnosis not present

## 2019-11-10 DIAGNOSIS — Z8521 Personal history of malignant neoplasm of larynx: Secondary | ICD-10-CM | POA: Diagnosis not present

## 2019-11-10 DIAGNOSIS — R82998 Other abnormal findings in urine: Secondary | ICD-10-CM | POA: Insufficient documentation

## 2019-11-10 LAB — COMPREHENSIVE METABOLIC PANEL
ALT: 19 U/L (ref 0–44)
AST: 25 U/L (ref 15–41)
Albumin: 3.7 g/dL (ref 3.5–5.0)
Alkaline Phosphatase: 41 U/L (ref 38–126)
Anion gap: 10 (ref 5–15)
BUN: 6 mg/dL — ABNORMAL LOW (ref 8–23)
CO2: 27 mmol/L (ref 22–32)
Calcium: 9.4 mg/dL (ref 8.9–10.3)
Chloride: 103 mmol/L (ref 98–111)
Creatinine, Ser: 0.69 mg/dL (ref 0.61–1.24)
GFR calc Af Amer: >60 mL/min (ref >60)
GFR calc non Af Amer: >60 mL/min (ref >60)
Glucose, Bld: 120 mg/dL — ABNORMAL HIGH (ref 70–99)
Potassium: 4.1 mmol/L (ref 3.5–5.1)
Sodium: 140 mmol/L (ref 135–145)
Total Bilirubin: 0.7 mg/dL (ref 0.3–1.2)
Total Protein: 7.7 g/dL (ref 6.5–8.1)

## 2019-11-10 LAB — URINALYSIS, ROUTINE W REFLEX MICROSCOPIC
Bilirubin Urine: NEGATIVE
Glucose, UA: NEGATIVE mg/dL
Hgb urine dipstick: NEGATIVE
Ketones, ur: NEGATIVE mg/dL
Leukocytes,Ua: NEGATIVE
Nitrite: NEGATIVE
Protein, ur: NEGATIVE mg/dL
Specific Gravity, Urine: 1.016 (ref 1.005–1.030)
pH: 7 (ref 5.0–8.0)

## 2019-11-10 LAB — CBC WITH DIFFERENTIAL/PLATELET
Abs Immature Granulocytes: 0.01 10*3/uL (ref 0.00–0.07)
Basophils Absolute: 0 10*3/uL (ref 0.0–0.1)
Basophils Relative: 1 %
Eosinophils Absolute: 0.1 10*3/uL (ref 0.0–0.5)
Eosinophils Relative: 2 %
HCT: 46 % (ref 39.0–52.0)
Hemoglobin: 15.2 g/dL (ref 13.0–17.0)
Immature Granulocytes: 0 %
Lymphocytes Relative: 21 %
Lymphs Abs: 1 10*3/uL (ref 0.7–4.0)
MCH: 31 pg (ref 26.0–34.0)
MCHC: 33 g/dL (ref 30.0–36.0)
MCV: 93.9 fL (ref 80.0–100.0)
Monocytes Absolute: 0.6 10*3/uL (ref 0.1–1.0)
Monocytes Relative: 13 %
Neutro Abs: 3.1 10*3/uL (ref 1.7–7.7)
Neutrophils Relative %: 63 %
Platelets: 151 10*3/uL (ref 150–400)
RBC: 4.9 MIL/uL (ref 4.22–5.81)
RDW: 12.3 % (ref 11.5–15.5)
WBC: 4.8 10*3/uL (ref 4.0–10.5)
nRBC: 0 % (ref 0.0–0.2)

## 2019-11-10 MED ORDER — TRAMADOL HCL 50 MG PO TABS: 50.0000 mg | ORAL_TABLET | Freq: Four times a day (QID) | ORAL | 0 refills | Status: DC | PRN

## 2019-11-10 MED ORDER — ONDANSETRON HCL 4 MG/2ML IJ SOLN
4.0000 mg | Freq: Once | INTRAMUSCULAR | Status: AC
  Administered 2019-11-10: 4 mg via INTRAVENOUS
  Filled 2019-11-10: qty 2

## 2019-11-10 MED ORDER — FENTANYL CITRATE (PF) 100 MCG/2ML IJ SOLN
100.0000 ug | INTRAMUSCULAR | Status: DC | PRN
  Administered 2019-11-10 (×2): 100 ug via INTRAVENOUS
  Filled 2019-11-10 (×2): qty 2

## 2019-11-10 NOTE — Discharge Instructions (Signed)
The pain in your left side is most likely due to muscles or bones.  For pain, use heat on the sore area 3-4 times a day.  We sent a prescription for pain reliever to your pharmacy.  You can also try using Tylenol or Motrin for the pain.  Follow-up with your doctor if not better in 2 or 3 days.

## 2019-11-10 NOTE — ED Notes (Signed)
Taken to CT.

## 2019-11-10 NOTE — ED Triage Notes (Signed)
Came in via ems; from home; c/o left flank pain started at 8pm last night; and radiates to the front. Stated took some hydrocodone last night and it helped; EMS endorsed patient stated darker than usual urine output this morning.

## 2019-11-10 NOTE — ED Provider Notes (Signed)
[facility] EMERGENCY DEPARTMENT Provider Note   CSN: [id number] Arrival date & time: 11/10/19  [id number]     History Chief Complaint  Patient presents with   Flank Pain    LEFT    [name] is a 69 y.o. male.  HPI He presents for evaluation of left chest and left mid back pain, starting yesterday, worse with inspiration improves with rest and shallow breathing.  He is also noticing dark color to his urine in the last couple of days.  He denies dysuria, urinary frequency, fever, chills, nausea or vomiting.  He is currently being treated for head and neck cancer with radiation, and recently had a lymph node biopsy with results pending, currently.  No prior history of kidney stone.  No nausea, vomiting, weakness or dizziness.  There are no other known modifying factors.    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[place]) 05/02/2019   Squamous cell carcinoma of neck 04/01/2019    Past Surgical History:  Procedure Laterality Date   ANKLE SURGERY  2011   right ankle   COLONOSCOPY  02/2019   polyps - Dr Havery Moros   DIRECT LARYNGOSCOPY N/A 04/01/2019   Procedure: DIRECT LARYNGOSCOPY WITH BIOPSY;  Surgeon: Jodi Marble, MD;  Location: [place];  Service: ENT;  Laterality: N/A;   ESOPHAGOSCOPY N/A 04/01/2019   Procedure: ESOPHAGOSCOPY;  Surgeon: Jodi Marble, MD;  Location: [place];  Service: ENT;  Laterality: N/A;   EYE SURGERY Right    KNEE SURGERY     LARYNGOSCOPY AND BRONCHOSCOPY N/A 04/01/2019    Procedure: BRONCHOSCOPY;  Surgeon: Jodi Marble, MD;  [facility];  Service: ENT;  Laterality: N/A;       Family History  Problem Relation Age of Onset   Breast cancer Sister    Colon cancer Brother 10       ????   Cancer Brother     Social History   Tobacco Use   Smoking status: Former Smoker    Years: 50.00    Types: Cigarettes    Quit date: 03/16/2019    Years since quitting: 0.6   Smokeless tobacco: Never Used  Substance Use Topics   Alcohol use: Yes    Comment: occasional   Drug use: Yes    Types: Cocaine    Comment: none for 6 months    Home Medications Prior to Admission medications   Medication Sig Start Date End Date Taking? Authorizing Provider  acetaminophen (TYLENOL) 500 MG tablet Take 1,000 mg by mouth every 6 (six) hours as needed for moderate pain or headache.   Yes [provider]  atropine 1 % ophthalmic solution Place 1 drop into the right eye 2 (two) times a day.    Yes [provider]  cetirizine (ZYRTEC) 10 MG tablet Take 10 mg by mouth daily as needed for allergies.   Yes [provider]  docusate sodium (COLACE) 100 MG capsule Take  2 capsules (200 mg total) by mouth daily. To prevent constipation while on pain meds. Patient taking differently: Take 200 mg by mouth daily as needed for mild constipation. To prevent constipation while on pain meds. 06/20/19  Yes [name], MD  ibuprofen (ADVIL) 200 MG tablet Take 400 mg by mouth every 6 (six) hours as needed for headache or moderate pain.   Yes [provider]  lidocaine (XYLOCAINE) 2 % solution Patient: Mix 1part 2% viscous lidocaine, 1part H20. Swallow 58mL of diluted mixture, 30min before meals and at bedtime, up to QID 10/28/19  Yes [name], MD  prednisoLONE acetate (PRED FORTE) 1 % ophthalmic suspension Place 1 drop into the right eye 3 (three) times daily.    Yes [provider]  Sofosbuvir-Velpatasvir (EPCLUSA) 400-100 MG TABS Take 1  tablet by mouth daily. Take 1 tablet by mouth daily. 09/22/19  Yes Golden Circle, FNP  tolterodine (DETROL LA) 4 MG 24 hr capsule Take 4 mg by mouth daily. 01/16/19  Yes [provider]  benzonatate (TESSALON) 200 MG capsule Take 1 capsule (200 mg total) by mouth 3 (three) times daily as needed for cough. Patient not taking: Reported on 11/10/2019 07/04/19   [name], MD  gabapentin (NEURONTIN) 300 MG capsule Take 1 capsule (300 mg total) by mouth 3 (three) times daily. Patient not taking: Reported on 11/10/2019 07/04/19   [name], MD  HYDROcodone-acetaminophen (HYCET) 7.5-325 mg/15 ml solution Take 10-15 mLs by mouth every 4 (four) hours as needed for moderate pain. Patient not taking: Reported on 11/10/2019 06/20/19   [name], MD  latanoprost (XALATAN) 0.005 % ophthalmic solution Place 1 drop into the right eye at bedtime.     [provider]  traMADol (ULTRAM) 50 MG tablet Take 1 tablet (50 mg total) by mouth every 6 (six) hours as needed. 11/10/19   Daleen Bo, MD    Allergies    Patient has no known allergies.  Review of Systems   Review of Systems  All other systems reviewed and are negative.   Physical Exam Updated Vital Signs BP (!) 145/92    Pulse 84    Temp 98.4 F (36.9 C) (Oral)    Resp 15    Ht 5\' 8"  (1.727 m)    Wt 63.5 kg    SpO2 98%    BMI 21.29 kg/m   Physical Exam Vitals and nursing note reviewed.  Constitutional:      General: He is in acute distress (Uncomfortable).     Appearance: He is well-developed. He is not ill-appearing, toxic-appearing or diaphoretic.  HENT:     Head: Normocephalic and atraumatic.     Right Ear: External ear normal.     Left Ear: External ear normal.     Mouth/Throat:     Pharynx: No oropharyngeal exudate or posterior oropharyngeal erythema.  Eyes:     Conjunctiva/sclera: Conjunctivae normal.     Pupils: Pupils are equal, round, and reactive to light.  Neck:     Trachea: Phonation normal.   Cardiovascular:     Rate and Rhythm: Normal rate and regular rhythm.     Heart sounds: Normal heart sounds.  Pulmonary:     Effort: Pulmonary effort is normal.     Breath sounds: Normal breath sounds.  Abdominal:     Palpations: Abdomen is soft.     Tenderness: There is no abdominal tenderness.  Genitourinary:    Comments: Moderate left costovertebral angle tenderness with light percussion. Musculoskeletal:  General: Normal range of motion.     Cervical back: Normal range of motion and neck supple.  Skin:    General: Skin is warm and dry.  Neurological:     Mental Status: He is alert and oriented to person, place, and time.     Cranial Nerves: No cranial nerve deficit.     Sensory: No sensory deficit.     Motor: No abnormal muscle tone.     Coordination: Coordination normal.  Psychiatric:        Mood and Affect: Mood normal.        Behavior: Behavior normal.        Thought Content: Thought content normal.        Judgment: Judgment normal.     ED Results / Procedures / Treatments   Labs (all labs ordered are listed, but only abnormal results are displayed) Labs Reviewed  COMPREHENSIVE METABOLIC PANEL - Abnormal; Notable for the following components:      Result Value   Glucose, Bld 120 (*)    BUN 6 (*)    All other components within normal limits  URINALYSIS, ROUTINE W REFLEX MICROSCOPIC  CBC WITH DIFFERENTIAL/PLATELET    EKG None  Radiology CT Renal Stone Study  Result Date: 11/10/2019 CLINICAL DATA:  Acute left flank pain. EXAM: CT ABDOMEN AND PELVIS WITHOUT CONTRAST TECHNIQUE: Multidetector CT imaging of the abdomen and pelvis was performed following the standard protocol without IV contrast. COMPARISON:  January 03, 2018. FINDINGS: Lower chest: No acute abnormality. Hepatobiliary: No focal liver abnormality is seen. No gallstones, gallbladder wall thickening, or biliary dilatation. Pancreas: Unremarkable. No pancreatic ductal dilatation or surrounding  inflammatory changes. Spleen: Normal in size without focal abnormality. Adrenals/Urinary Tract: Adrenal glands are unremarkable. Kidneys are normal, without renal calculi, focal lesion, or hydronephrosis. Bladder is unremarkable. Stomach/Bowel: Stomach is within normal limits. Appendix appears normal. No evidence of bowel wall thickening, distention, or inflammatory changes. Diverticulosis of descending and sigmoid colon is noted without inflammation. Vascular/Lymphatic: Aortic atherosclerosis. No enlarged abdominal or pelvic lymph nodes. Reproductive: Prostate is unremarkable. Other: No abdominal wall hernia or abnormality. No abdominopelvic ascites. Musculoskeletal: No acute or significant osseous findings. IMPRESSION: 1. Diverticulosis of descending and sigmoid colon is noted without inflammation. 2. No acute abnormality seen in the abdomen or pelvis. Aortic Atherosclerosis (ICD10-I70.0). Electronically Signed   By: Marijo Conception M.D.   On: 11/10/2019 12:50    Procedures Procedures (including critical care time)  Medications Ordered in ED Medications  fentaNYL (SUBLIMAZE) injection 100 mcg (100 mcg Intravenous Given 11/10/19 1414)  ondansetron (ZOFRAN) injection 4 mg (4 mg Intravenous Given 11/10/19 1315)    ED Course  I have reviewed the triage vital signs and the nursing notes.  Pertinent labs & imaging results that were available during my care of the patient were reviewed by me and considered in my medical decision making (see chart for details).  Clinical Course as of Nov 09 1537  Thu Nov 10, 2019  1258 Normal except glucose high, BUN low  Comprehensive metabolic panel(!) [EW]  [id number] Normal  Urinalysis, Routine w reflex microscopic [EW]  1259 Normal  CBC with Differential [EW]  1301 No hydronephrosis or visible stone in ureter or bladder.  Interpreted by me.  CT Renal Stone Study [EW]  1302 Neutrophils: 63 [EW]  1311 At this time he states he is nauseated and feeling like he will  vomit.  He states that he has just a "little bit," of pain in the left  flank region.  He remains lucid and cooperative.   [EW]    Clinical Course User Index [EW] Daleen Bo, MD   MDM Rules/Calculators/A&P                       Patient Vitals for the past 24 hrs:  BP Temp Temp src Pulse Resp SpO2 Height Weight  11/10/19 1445 (!) 145/92 -- -- 84 15 98 % -- --  11/10/19 1430 (!) 140/103 -- -- 92 17 96 % -- --  11/10/19 1415 (!) 157/102 -- -- 90 (!) 23 98 % -- --  11/10/19 1400 (!) 136/104 -- -- 92 14 98 % -- --  11/10/19 1345 (!) 148/106 -- -- 90 13 98 % -- --  11/10/19 1330 (!) 153/101 -- -- 93 16 98 % -- --  11/10/19 1315 (!) 148/102 -- -- 87 19 98 % -- --  11/10/19 1300 (!) 142/97 -- -- 84 19 97 % -- --  11/10/19 1245 (!) 152/98 -- -- 82 18 99 % -- --  11/10/19 1215 (!) 145/108 -- -- 78 14 97 % -- --  11/10/19 1200 (!) 170/111 -- -- 80 (!) 23 99 % -- --  11/10/19 1158 (!) 178/123 -- -- 82 -- 100 % -- --  11/10/19 0930 -- -- -- -- -- -- 5\' 8"  (1.727 m) 63.5 kg  11/10/19 0926 (!) 160/102 98.4 F (36.9 C) Oral 72 20 100 % -- --    3:34 PM Reevaluation with update and discussion. After initial assessment and treatment, an updated evaluation reveals at this time he is sitting up, not nauseated, and wants to go have a bowel movement.  Vital signs are reassuring.  While sitting, he has moderate tenderness diffusely of the left flank including left lumbar, left posterior chest and left lateral chest.  Findings discussed with the patient and all questions were answered. Daleen Bo   Medical Decision Making: Nonspecific flank pain with reassuring evaluation.  Suspect musculoskeletal pain, as primary diagnosis.  Doubt pneumonia, PE, kidney stone, urinary tract infection, metabolic instability or impending vascular collapse.  [name] was evaluated in Emergency Department on 11/10/2019 for the symptoms described in the history of present illness. He was evaluated in the context of the  global COVID-19 pandemic, which necessitated consideration that the patient might be at risk for infection with the SARS-CoV-2 virus that causes COVID-19. Institutional protocols and algorithms that pertain to the evaluation of patients at risk for COVID-19 are in a state of rapid change based on information released by regulatory bodies including the CDC and federal and state organizations. These policies and algorithms were followed during the patient's care in the ED.   CRITICAL CARE-no Performed by: Daleen Bo   Nursing Notes Reviewed/ Care Coordinated Applicable Imaging Reviewed Interpretation of Laboratory Data incorporated into ED treatment  The patient appears reasonably screened and/or stabilized for discharge and I doubt any other medical condition or other [facility] requiring further screening, evaluation, or treatment in the ED at this time prior to discharge.  Plan: Home Medications-continue usual medications; Home Treatments-apply heat to affected area 3-4 times a day; return here if the recommended treatment, does not improve the symptoms; Recommended follow up-PCP checkup 1 to 2 weeks and as needed.    Final Clinical Impression(s) / ED Diagnoses Final diagnoses:  Left flank pain    Rx / DC Orders ED Discharge Orders         Ordered  traMADol (ULTRAM) 50 MG tablet  Every 6 hours PRN     11/10/19 1539           Daleen Bo, MD 11/10/19 1539

## 2019-11-18 ENCOUNTER — Other Ambulatory Visit: Payer: Self-pay | Admitting: Radiology

## 2019-11-18 ENCOUNTER — Other Ambulatory Visit: Payer: Self-pay | Admitting: Physician Assistant

## 2019-11-21 ENCOUNTER — Other Ambulatory Visit: Payer: Self-pay | Admitting: Radiation Oncology

## 2019-11-21 ENCOUNTER — Ambulatory Visit (HOSPITAL_COMMUNITY)
Admission: RE | Admit: 2019-11-21 | Discharge: 2019-11-21 | Disposition: A | Discharge status: Home / Self Care | Payer: Medicare HMO | Source: Ambulatory Visit | Attending: Internal Medicine | Admitting: Internal Medicine

## 2019-11-21 ENCOUNTER — Encounter (HOSPITAL_COMMUNITY): Payer: Self-pay

## 2019-11-21 ENCOUNTER — Ambulatory Visit (HOSPITAL_COMMUNITY)
Admission: RE | Admit: 2019-11-21 | Discharge: 2019-11-21 | Disposition: A | Discharge status: Home / Self Care | Payer: Medicare HMO | Source: Ambulatory Visit | Attending: Radiation Oncology | Admitting: Radiation Oncology

## 2019-11-21 ENCOUNTER — Other Ambulatory Visit: Payer: Self-pay

## 2019-11-21 DIAGNOSIS — Z87891 Personal history of nicotine dependence: Secondary | ICD-10-CM | POA: Diagnosis not present

## 2019-11-21 DIAGNOSIS — Z8521 Personal history of malignant neoplasm of larynx: Secondary | ICD-10-CM | POA: Insufficient documentation

## 2019-11-21 DIAGNOSIS — R590 Localized enlarged lymph nodes: Secondary | ICD-10-CM | POA: Insufficient documentation

## 2019-11-21 DIAGNOSIS — C321 Malignant neoplasm of supraglottis: Secondary | ICD-10-CM | POA: Diagnosis not present

## 2019-11-21 DIAGNOSIS — B1920 Unspecified viral hepatitis C without hepatic coma: Secondary | ICD-10-CM | POA: Diagnosis not present

## 2019-11-21 DIAGNOSIS — E119 Type 2 diabetes mellitus without complications: Secondary | ICD-10-CM | POA: Insufficient documentation

## 2019-11-21 DIAGNOSIS — Z923 Personal history of irradiation: Secondary | ICD-10-CM | POA: Insufficient documentation

## 2019-11-21 DIAGNOSIS — Z79899 Other long term (current) drug therapy: Secondary | ICD-10-CM | POA: Insufficient documentation

## 2019-11-21 DIAGNOSIS — I700 Atherosclerosis of aorta: Secondary | ICD-10-CM | POA: Insufficient documentation

## 2019-11-21 MED ORDER — FENTANYL CITRATE (PF) 100 MCG/2ML IJ SOLN
INTRAMUSCULAR | Status: AC | PRN
  Administered 2019-11-21 (×2): 50 ug via INTRAVENOUS

## 2019-11-21 MED ORDER — FENTANYL CITRATE (PF) 100 MCG/2ML IJ SOLN
INTRAMUSCULAR | Status: AC
  Filled 2019-11-21: qty 2

## 2019-11-21 MED ORDER — LIDOCAINE HCL 1 % IJ SOLN
INTRAMUSCULAR | Status: AC
  Filled 2019-11-21: qty 20

## 2019-11-21 MED ORDER — LIDOCAINE HCL (PF) 1 % IJ SOLN
INTRAMUSCULAR | Status: AC | PRN
  Administered 2019-11-21: 5 mL

## 2019-11-21 MED ORDER — SODIUM CHLORIDE 0.9 % IV SOLN: INTRAVENOUS | Status: DC

## 2019-11-21 MED ORDER — MIDAZOLAM HCL 2 MG/2ML IJ SOLN
INTRAMUSCULAR | Status: AC
  Filled 2019-11-21: qty 4

## 2019-11-21 MED ORDER — MIDAZOLAM HCL 2 MG/2ML IJ SOLN
INTRAMUSCULAR | Status: AC | PRN
  Administered 2019-11-21 (×2): 1 mg via INTRAVENOUS

## 2019-11-21 NOTE — Progress Notes (Signed)
Patient transportation home after procedure by [facility] service [employer].  Rowe Robert, PA with radiology notified and approved transportation .

## 2019-11-21 NOTE — Consult Note (Signed)
Chief Complaint: Patient was seen in consultation today for image guided right cervical lymph node biopsy  Referring Physician(s): [name]  Supervising Physician: Daryll Brod  Patient Status: [name] - Out-pt  History of Present Illness: [name] is a 69 y.o. 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  Recent PET scan on 10/27/2019 revealed:  1. New FDG avid right level 2 cervical lymph node concerning for recurrent disease. No findings of distant metastatic disease within the chest, abdomen or pelvis. 2. Mild nonspecific increased uptake in the right side of larynx is similar to previous exam. 3. Aortic Atherosclerosis (ICD10-I70.0). Coronary artery Calcifications.  He presents today for image guided right cervical lymph node biopsy for further evaluation.  Past Medical History:  Diagnosis Date  . Diabetes mellitus without complication (HCC)    no meds  . ETOH abuse   . Frequent urination   . Glaucoma   . Hepatitis C virus infection without hepatic coma    dx'ed in 11/2018  . History of radiation therapy 05/12/19- 07/06/19   Larynx  . Wears denture    upper only; lost lower denture    Past Surgical History:  Procedure Laterality Date  . ANKLE SURGERY  2011   right ankle  . COLONOSCOPY  02/2019   polyps - Dr Havery Moros  . DIRECT LARYNGOSCOPY N/A 04/01/2019   Procedure: DIRECT LARYNGOSCOPY WITH BIOPSY;  Surgeon: Jodi Marble, MD;  Location: [facility];  Service: ENT;  Laterality: N/A;  . ESOPHAGOSCOPY N/A 04/01/2019   Procedure: ESOPHAGOSCOPY;  Surgeon: Jodi Marble, MD;  Location: [facility];  Service: ENT;  Laterality: N/A;  . EYE SURGERY Right   . KNEE SURGERY    . LARYNGOSCOPY AND BRONCHOSCOPY N/A 04/01/2019   Procedure: BRONCHOSCOPY;  Surgeon: Jodi Marble, MD;  Location: [facility];  Service: ENT;  Laterality: N/A;    Allergies: Patient has no known  allergies.  Medications: Prior to Admission medications   Medication Sig Start Date End Date Taking? Authorizing Provider  acetaminophen (TYLENOL) 500 MG tablet Take 1,000 mg by mouth every 6 (six) hours as needed for moderate pain or headache.    [provider]  atropine 1 % ophthalmic solution Place 1 drop into the right eye 2 (two) times a day.     [provider]  benzonatate (TESSALON) 200 MG capsule Take 1 capsule (200 mg total) by mouth 3 (three) times daily as needed for cough. Patient not taking: Reported on 11/10/2019 07/04/19   [name], MD  cetirizine (ZYRTEC) 10 MG tablet Take 10 mg by mouth daily as needed for allergies.    [provider]  docusate sodium (COLACE) 100 MG capsule Take 2 capsules (200 mg total) by mouth daily. To prevent constipation while on pain meds. Patient taking differently: Take 200 mg by mouth daily as needed for mild constipation. To prevent constipation while on pain meds. 06/20/19   [name], MD  gabapentin (NEURONTIN) 300 MG capsule Take 1 capsule (300 mg total) by mouth 3 (three) times daily. Patient not taking: Reported on 11/10/2019 07/04/19   [name], MD  HYDROcodone-acetaminophen (HYCET) 7.5-325 mg/15 ml solution Take 10-15 mLs by mouth every 4 (four) hours as needed for moderate pain. Patient not taking: Reported on 11/10/2019 06/20/19   [name], MD  ibuprofen (ADVIL) 200 MG tablet Take 400 mg by mouth every 6 (six) hours as needed for headache or moderate pain.  [provider]  latanoprost (XALATAN) 0.005 % ophthalmic solution Place 1 drop into the right eye at bedtime.     [provider]  lidocaine (XYLOCAINE) 2 % solution Patient: Mix 1part 2% viscous lidocaine, 1part H20. Swallow 25mL of diluted mixture, 64min before meals and at bedtime, up to QID 10/28/19   [name], MD  prednisoLONE acetate (PRED FORTE) 1 % ophthalmic suspension Place 1 drop into the right eye 3 (three)  times daily.     [provider]  Sofosbuvir-Velpatasvir (EPCLUSA) 400-100 MG TABS Take 1 tablet by mouth daily. Take 1 tablet by mouth daily. 09/22/19   Golden Circle, FNP  tolterodine (DETROL LA) 4 MG 24 hr capsule Take 4 mg by mouth daily. 01/16/19   [provider]  traMADol (ULTRAM) 50 MG tablet Take 1 tablet (50 mg total) by mouth every 6 (six) hours as needed. 11/10/19   Daleen Bo, MD     Family History  Problem Relation Age of Onset  . Breast cancer Sister   . Colon cancer Brother 59       ????  . Cancer Brother     Social History   Socioeconomic History  . Marital status: Single    Spouse name: Not on file  . Number of children: 2  . Years of education: Not on file  . Highest education level: Not on file  Occupational History  . Not on file  Tobacco Use  . Smoking status: Former Smoker    Years: 50.00    Types: Cigarettes    Quit date: 03/16/2019    Years since quitting: 0.6  . Smokeless tobacco: Never Used  Substance and Sexual Activity  . Alcohol use: Yes    Comment: occasional  . Drug use: Yes    Types: Cocaine    Comment: none for 6 months  . Sexual activity: Not on file  Other Topics Concern  . Not on file  Social History Narrative   Patient is divorced with 2 children.   Patient is currently living with his sister.   Patient with a history of smoking a third of pack of cigarettes daily for 50 years.  Patient currently smoking 2 to 3 cigarettes/day.   Patient has never used smokeless tobacco.   Patient with occasional use of alcohol.   Patient last used cocaine approximately 6 months ago.  Patient denies use of marijuana.   Social Determinants of Health   Financial Resource Strain:   . Difficulty of Paying Living Expenses: Not on file  Food Insecurity:   . Worried About Charity fundraiser in the Last Year: Not on file  . Ran Out of Food in the Last Year: Not on file  Transportation Needs: No Transportation Needs  . Lack of  Transportation (Medical): No  . Lack of Transportation (Non-Medical): No  Physical Activity:   . Days of Exercise per Week: Not on file  . Minutes of Exercise per Session: Not on file  Stress:   . Feeling of Stress : Not on file  Social Connections:   . Frequency of Communication with Friends and Family: Not on file  . Frequency of Social Gatherings with Friends and Family: Not on file  . Attends Religious Services: Not on file  . Active Member of Clubs or Organizations: Not on file  . Attends Archivist Meetings: Not on file  . Marital Status: Not on file      Review of Systems denies fever,  headache, chest pain, dyspnea, cough, abdominal/back pain, nausea, vomiting or bleeding  Vital Signs: BP (!) 139/94   Pulse 91   Temp 97.9 F (36.6 C) (Oral)   Resp 18   SpO2 97%   Physical Exam awake, alert.  Chest clear to auscultation bilaterally.  Heart with regular rate and rhythm.  Abdomen soft, positive bowel sounds, nontender.  No lower extremity edema.  Imaging: NM PET Image Restag (PS) Skull Base To Thigh  Result Date: 10/28/2019 CLINICAL DATA:  Subsequent treatment strategy for laryngeal carcinoma. EXAM: NUCLEAR MEDICINE PET SKULL BASE TO THIGH TECHNIQUE: 7.85 mCi F-18 FDG was injected intravenously. Full-ring PET imaging was performed from the skull base to thigh after the radiotracer. CT data was obtained and used for attenuation correction and anatomic localization. Fasting blood glucose: 92 mg/dl COMPARISON:  04/22/2019 FINDINGS: Mediastinal blood pool activity: SUV max 2.47 Liver activity: SUV max NA NECK: FDG avid right level 2 lymph node measures 0.8 cm and has an SUV max of 8.3. Mild asymmetric activity within the right-side of larynx with SUV max of 3.47. Previously 3.99. Incidental CT findings: none CHEST: No FDG avid axillary, supraclavicular, mediastinal, or hilar lymph nodes. Biapical pleuroparenchymal scarring. No FDG avid pulmonary nodule or mass. Incidental  CT findings: Lad, left circumflex and RCA coronary artery calcifications. Aortic atherosclerosis. ABDOMEN/PELVIS: No abnormal FDG uptake identified within the liver, pancreas, or spleen. The adrenal glands are unremarkable. No FDG avid lymph nodes within the abdomen or pelvis. No inguinal FDG avid lymph nodes. Incidental CT findings: Aortic atherosclerosis. Diverticulosis is identified involving the colon. SKELETON: No focal hypermetabolic activity to suggest skeletal metastasis. Incidental CT findings: none IMPRESSION: 1. New FDG avid right level 2 cervical lymph node concerning for recurrent disease. No findings of distant metastatic disease within the chest, abdomen or pelvis. 2. Mild nonspecific increased uptake in the right side of larynx is similar to previous exam. 3. Aortic Atherosclerosis (ICD10-I70.0). Coronary artery calcifications. The Electronically Signed   By: Kerby Moors M.D.   On: 10/28/2019 08:33   CT Renal Stone Study  Result Date: 11/10/2019 CLINICAL DATA:  Acute left flank pain. EXAM: CT ABDOMEN AND PELVIS WITHOUT CONTRAST TECHNIQUE: Multidetector CT imaging of the abdomen and pelvis was performed following the standard protocol without IV contrast. COMPARISON:  January 03, 2018. FINDINGS: Lower chest: No acute abnormality. Hepatobiliary: No focal liver abnormality is seen. No gallstones, gallbladder wall thickening, or biliary dilatation. Pancreas: Unremarkable. No pancreatic ductal dilatation or surrounding inflammatory changes. Spleen: Normal in size without focal abnormality. Adrenals/Urinary Tract: Adrenal glands are unremarkable. Kidneys are normal, without renal calculi, focal lesion, or hydronephrosis. Bladder is unremarkable. Stomach/Bowel: Stomach is within normal limits. Appendix appears normal. No evidence of bowel wall thickening, distention, or inflammatory changes. Diverticulosis of descending and sigmoid colon is noted without inflammation. Vascular/Lymphatic: Aortic  atherosclerosis. No enlarged abdominal or pelvic lymph nodes. Reproductive: Prostate is unremarkable. Other: No abdominal wall hernia or abnormality. No abdominopelvic ascites. Musculoskeletal: No acute or significant osseous findings. IMPRESSION: 1. Diverticulosis of descending and sigmoid colon is noted without inflammation. 2. No acute abnormality seen in the abdomen or pelvis. Aortic Atherosclerosis (ICD10-I70.0). Electronically Signed   By: Marijo Conception M.D.   On: 11/10/2019 12:50    Labs:  CBC: Recent Labs    04/01/19 0605 05/04/19 1207 09/06/19 1543 11/10/19 0940  WBC 4.7 3.8* 3.5* 4.8  HGB 14.9 14.0 14.4 15.2  HCT 45.1 42.7 41.5 46.0  PLT 207 152 184 151  COAGS: Recent Labs    09/06/19 1543  INR 1.1    BMP: Recent Labs    04/01/19 0605 05/04/19 1207 10/26/19 1523 11/10/19 0940  NA 137 141 141 140  K 3.9 4.3 4.0 4.1  CL 104 105 105 103  CO2 25 26 30 27   GLUCOSE 130* 94 114* 120*  BUN <5* 9 9 6*  CALCIUM 9.2 9.2 9.5 9.4  CREATININE 0.64 0.74 0.61* 0.69  GFRNONAA >60 >60  --  >60  GFRAA >60 >60  --  >60    LIVER FUNCTION TESTS: Recent Labs    12/13/18 1047 12/13/18 1047 04/01/19 0605 04/01/19 0605 05/04/19 1207 09/06/19 1543 10/26/19 1523 11/10/19 0940  BILITOT 0.8   < > 1.1  --  0.5 1.0 0.5 0.7  AST 45*   < > 38  --  60* 49* 19 25  ALT 56*   < > 36  --  61* 53*  53* 16 19  ALKPHOS 45  --  43  --  54  --   --  41  PROT 7.3   < > 7.8   < > 7.6 7.8 7.3 7.7  ALBUMIN 3.7  --  3.4*  --  3.5  --   --  3.7   < > = values in this interval not displayed.    TUMOR MARKERS: No results for input(s): AFPTM, CEA, CA199, CHROMGRNA in the last 8760 hours.  Assessment and Plan: 69 y.o. 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  Recent PET scan on 10/27/2019 revealed:  1. New FDG avid right level 2 cervical lymph node concerning for recurrent disease. No findings  of distant metastatic disease within the chest, abdomen or pelvis. 2. Mild nonspecific increased uptake in the right side of larynx is similar to previous exam. 3. Aortic Atherosclerosis (ICD10-I70.0). Coronary artery Calcifications.  He presents today for image guided right cervical lymph node biopsy for further evaluation.Risks and benefits of procedure was discussed with the patient  including, but not limited to bleeding, infection, damage to adjacent structures or low yield requiring additional tests.  All of the questions were answered and there is agreement to proceed.  Consent signed and in chart.     Thank you for this interesting consult.  I greatly enjoyed meeting Chrisopher E Oldman and look forward to participating in their care.  A copy of this report was sent to the requesting provider on this date.  Electronically Signed: D. Rowe Robert, PA-C 11/21/2019, 12:13 PM   I spent a total of 20 minutes   in face to face in clinical consultation, greater than 50% of which was counseling/coordinating care for image guided right cervical lymph node biopsy

## 2019-11-21 NOTE — Discharge Instructions (Signed)
Please call Interventional Radiology [phone] with any questions about your biopsy site.  You may remove your dressing and shower tomorrow.  Moderate Conscious Sedation, Adult, Care After These instructions provide you with information about caring for yourself after your procedure. Your health care provider may also give you more specific instructions. Your treatment has been planned according to current medical practices, but problems sometimes occur. Call your health care provider if you have any problems or questions after your procedure. What can I expect after the procedure? After your procedure, it is common:  To feel sleepy for several hours.  To feel clumsy and have poor balance for several hours.  To have poor judgment for several hours.  To vomit if you eat too soon. Follow these instructions at home: For at least 24 hours after the procedure:   Do not: ? Participate in activities where you could fall or become injured. ? Drive. ? Use heavy machinery. ? Drink alcohol. ? Take sleeping pills or medicines that cause drowsiness. ? Make important decisions or sign legal documents. ? Take care of children on your own.  Rest. Eating and drinking  Follow the diet recommended by your health care provider.  If you vomit: ? Drink water, juice, or soup when you can drink without vomiting. ? Make sure you have little or no nausea before eating solid foods. General instructions  Have a responsible adult stay with you until you are awake and alert.  Take over-the-counter and prescription medicines only as told by your health care provider.  If you smoke, do not smoke without supervision.  Keep all follow-up visits as told by your health care provider. This is important. Contact a health care provider if:  You keep feeling nauseous or you keep vomiting.  You feel light-headed.  You develop a rash.  You have a fever. Get help right away if:  You have trouble  breathing. This information is not intended to replace advice given to you by your health care provider. Make sure you discuss any questions you have with your health care provider. Document Revised: 08/14/2017 Document Reviewed: 12/22/2015 Elsevier Patient Education  [place].    Needle Biopsy, Care After These instructions tell you how to care for yourself after your procedure. Your doctor may also give you more specific instructions. Call your doctor if you have any problems or questions. What can I expect after the procedure? After the procedure, it is common to have:  Soreness.  Bruising.  Mild pain. Follow these instructions at home:   Return to your normal activities as told by your doctor. Ask your doctor what activities are safe for you.  Take over-the-counter and prescription medicines only as told by your doctor.  Wash your hands with soap and water before you change your bandage (dressing). If you cannot use soap and water, use hand sanitizer.  Follow instructions from your doctor about: ? How to take care of your puncture site. ? When and how to change your bandage. ? When to remove your bandage.  Check your puncture site every day for signs of infection. Watch for: ? Redness, swelling, or pain. ? Fluid or blood. ? Pus or a bad smell. ? Warmth.  Do not take baths, swim, or use a hot tub until your doctor approves. Ask your doctor if you may take showers. You may only be allowed to take sponge baths.  Keep all follow-up visits as told by your doctor. This is important. Contact a doctor  if you have:  A fever.  Redness, swelling, or pain at the puncture site, and it lasts longer than a few days.  Fluid, blood, or pus coming from the puncture site.  Warmth coming from the puncture site. Get help right away if:  You have a lot of bleeding from the puncture site. Summary  After the procedure, it is common to have soreness, bruising, or mild pain  at the puncture site.  Check your puncture site every day for signs of infection, such as redness, swelling, or pain.  Get help right away if you have severe bleeding from your puncture site. This information is not intended to replace advice given to you by your health care provider. Make sure you discuss any questions you have with your health care provider. Document Revised: 09/14/2017 Document Reviewed: 09/14/2017 Elsevier Patient Education  2020 [employer].

## 2019-11-21 NOTE — Procedures (Signed)
Interventional Radiology Procedure Note  Procedure: [place] BX RT NECK PET + NODE  Complications: None  Estimated Blood Loss: MIN  Findings: 18 G CORES X 4

## 2019-11-23 ENCOUNTER — Encounter: Payer: Self-pay | Admitting: Radiation Oncology

## 2019-11-23 ENCOUNTER — Telehealth: Payer: Self-pay | Admitting: *Deleted

## 2019-11-23 LAB — SURGICAL PATHOLOGY

## 2019-11-23 MED FILL — EPCLUSA 400 MG-100 MG TAB: 400-100 | 28 days supply | Qty: 28 | Fill #2

## 2019-11-23 NOTE — Telephone Encounter (Signed)
Oncology Nurse Navigator Documentation  Returned call to [name], spoke with him and friend/caregiver [name]. I informed them results of Monday's lymph node biopsy indicated it was cancerous, explained he is being referred to Dr. Esaw Dace to discuss surgery.   I further explained his case will be discussed at the ENT Conference next Wednesday morning. They understand he will receive a call from Dr. Noreene Filbert office to schedule an appointment. They voiced understanding of information, I encouraged them to call me with questions/concerns moving forward.  Gayleen Orem, RN, BSN Head & Neck Oncology Nurse [place] [facility] [place] [phone]

## 2019-11-23 NOTE — Progress Notes (Signed)
Patient recently had biopsy of right neck node which unfortunately is positive.  I notified his otolaryngology team and Gayleen Orem, RN, our Head and Neck Oncology Navigator  I have asked Gayleen Orem, RN, our Head and Neck Oncology Navigator to navigate the patient back to otolaryngology for consideration of salvage surgery. The patient will also be added to tumor board.  I left a voicemail with the patient asking him to call me or Liliane Channel back to review the results to his biopsy.   -----------------------------------  [name], MD

## 2019-11-29 ENCOUNTER — Telehealth: Payer: Self-pay | Admitting: *Deleted

## 2019-11-29 NOTE — Telephone Encounter (Signed)
Oncology Nurse Navigator Documentation  In follow-up to 2/19 fax sent (receipt confirmed) requesting appt with Dr. Constance Holster week of 3/15 s/p 3/8 biopsy, faxed repeat request. Notification of successful fax transmission received.  Gayleen Orem, RN, BSN Head & Neck Oncology Nurse [place] [facility] [place] [phone]

## 2019-12-01 MED FILL — EPCLUSA 400 MG-100 MG TAB: 400-100 | 28 days supply | Qty: 28 | Fill #2

## 2019-12-14 ENCOUNTER — Telehealth: Payer: Self-pay

## 2019-12-14 NOTE — Telephone Encounter (Signed)
Patient's friend Verdene Lennert left a VM wanting to know if Dr. Isidore Moos approved patient receiving 2nd COVID-19 vaccine since he tolerated the first vaccine poorly and has a history of cancer. Relayed information to Dr. Isidore Moos who stated that from her stand point she would like patient to receive full course of vaccination, but would like to defer official decision to patient's PCP since they are seeing patient more frequently and would know more about his current health status/response to first vaccine.   Returned phone call and relayed above information to patient's friend. Veronica verbalized understanding and stated that patient's PCP gave approval for patient to receive 2nd portion of COVID-19 vaccine. No other needs/concerns identified at this time, but encouraged friend to have patient call office back should something arise.

## 2019-12-26 NOTE — H&P (Signed)
HPI:   [name] is a 69 y.o. male who presents as a return Patient.   Current problem: Laryngeal cancer.  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 He completed radiation therapy about 5 months ago. Follow-up PET scan revealed an active right level 2 lymph node. FNA was positive for metastatic cancer. He is doing well with breathing and swallowing. He continues to smoke and drink on occasion.  PMH/Meds/All/SocHx/FamHx/ROS:   Past Medical History:  Diagnosis Date  . Glaucoma   Past Surgical History:  Procedure Laterality Date  . COLONOSCOPY   No family history of bleeding disorders, wound healing problems or difficulty with anesthesia.   Social History   Socioeconomic History  . Marital status: Unknown  Spouse name: Not on file  . Number of children: Not on file  . Years of education: Not on file  . Highest education level: Not on file  Occupational History  . Not on file  Tobacco Use  . Smoking status: Current Every Day Smoker  . Smokeless tobacco: Never Used  Substance and Sexual Activity  . Alcohol use: Yes  . Drug use: Not on file  . Sexual activity: Not on file  Other Topics Concern  . Not on file  Social History Narrative  . Not on file   Social Determinants of Health   Financial Resource Strain:  . Difficulty of Paying Living Expenses:  Food Insecurity:  . Worried About Charity fundraiser in the Last Year:  . Arboriculturist in the Last Year:  Transportation Needs:  . Film/video editor (Medical):  Marland Kitchen Lack of Transportation (Non-Medical):  Physical Activity:  . Days of Exercise per Week:  . Minutes of Exercise per Session:  Stress:  . Feeling of Stress :  Social Connections:  . Frequency of Communication with Friends and Family:  . Frequency of Social Gatherings with Friends and Family:  . Attends Religious Services:  . Active Member of Clubs or Organizations:  . Attends Archivist Meetings:  Marland Kitchen  Marital Status:   Current Outpatient Medications:  . aspirin 325 MG EC tablet *ANTIPLATELET*, Take 650 mg by mouth as needed., Disp: , Rfl:  . benzonatate (TESSALON) 200 MG capsule, Take 200 mg by mouth 3 (three) times daily as needed., Disp: , Rfl:  . cetirizine (ZYRTEC) 10 MG tablet, Take 10 mg by mouth daily., Disp: , Rfl:  . docusate sodium (COLACE) 100 MG capsule, Take 200 mg by mouth daily., Disp: , Rfl:  . EPCLUSA 400-100 mg tablet, , Disp: , Rfl:  . gabapentin (NEURONTIN) 300 MG capsule, Take 300 mg by mouth 3 times daily., Disp: , Rfl:  . latanoprost (XALATAN) 0.005 % ophthalmic solution, Apply 1 drop to eye., Disp: , Rfl:  . LIDOCAINE 2 % solution, , Disp: , Rfl:  . losartan (COZAAR) 50 MG tablet, TAKE 1 TABLET BY MOUTH EVERY DAY, Disp: , Rfl:  . tolterodine (DETROL LA) 4 MG 24 hr capsule, TAKE 1 CAPSULE BY MOUTH EVERY DAY, Disp: , Rfl:  . traMADoL (ULTRAM) 50 mg tablet, TAKE 1 TABLET (50 MG TOTAL) BY MOUTH EVERY 6 (SIX) HOURS AS NEEDED., Disp: , Rfl:    Physical Exam:   Overall thin healthy-appearing gentleman. Breathing and voice are clear. Palpation of the neck does not reveal any distinct mass palpable. He has some fibrosis. There is a slight fullness around the right level 2 lymph node but am not sure if I am feeling the actual node  or if it is the fibrotic tissue surrounding the carotid bulb.  Independent Review of Additional Tests or Records:  none  Procedures:  none  Impression & Plans:  Given the recent findings, recommend we proceed with right side modified neck dissection and endoscopy at the same time to evaluate the larynx and hypopharynx. Risks and benefits were discussed and he understands. Recommend he try very hard to stop smoking and drinking.

## 2020-01-02 NOTE — Progress Notes (Signed)
[id number] [address], [place] [address] Phone: [phone] Fax: [phone]  [place], [place] [address] [place] [address] Phone: [phone] Fax: [phone]      Your procedure is scheduled on Friday, January 06, 2020.  Report to [facility] Main Entrance "A" at 5:30 A.M., and check in at the Admitting office.  Call this number if you have problems the morning of surgery:  [phone]  Call [phone] if you have any questions prior to your surgery date Monday-Friday 8am-4pm    Remember:  Do not eat or drink after midnight the night before your surgery     Take these medicines the morning of surgery with A SIP OF WATER:  atropine 1 % ophthalmic solution Sofosbuvir-Velpatasvir (EPCLUSA)  tolterodine (DETROL LA)  acetaminophen (TYLENOL) - if needed cetirizine (ZYRTEC) - if needed  As of today, STOP taking any Aspirin (unless otherwise instructed by your surgeon) and Aspirin containing products, Aleve, Naproxen, Ibuprofen, Motrin, Advil, Goody's, BC's, all herbal medications, fish oil, and all vitamins.   HOW TO MANAGE YOUR DIABETES BEFORE AND AFTER SURGERY  Why is it important to control my blood sugar before and after surgery? . Improving blood sugar levels before and after surgery helps healing and can limit problems. . A way of improving blood sugar control is eating a healthy diet by: o  Eating less sugar and carbohydrates o  Increasing activity/exercise o  Talking with your doctor about reaching your blood sugar goals . High blood sugars (greater than 180 mg/dL) can raise your risk of infections and slow your recovery, so you will need to focus on controlling your diabetes during the weeks before surgery. . Make sure that the doctor who takes care of your diabetes knows about your planned surgery including the date and  location.  How do I manage my blood sugar before surgery? . Check your blood sugar at least 4 times a day, starting 2 days before surgery, to make sure that the level is not too high or low. . Check your blood sugar the morning of your surgery when you wake up and every 2 hours until you get to the Short Stay unit. o If your blood sugar is less than 70 mg/dL, you will need to treat for low blood sugar: - Do not take insulin. - Treat a low blood sugar (less than 70 mg/dL) with  cup of clear juice (cranberry or apple), 4 glucose tablets, OR glucose gel. - Recheck blood sugar in 15 minutes after treatment (to make sure it is greater than 70 mg/dL). If your blood sugar is not greater than 70 mg/dL on recheck, call [phone] for further instructions. . Report your blood sugar to the short stay nurse when you get to Short Stay.  . If you are admitted to the hospital after surgery: o Your blood sugar will be checked by the staff and you will probably be given insulin after surgery (instead of oral diabetes medicines) to make sure you have good blood sugar levels. o The goal for blood sugar control after surgery is 80-180 mg/dL.                      Do not wear jewelry.            Do not wear lotions, powders, colognes, or deodorant.  Men may shave face and neck.            Do not bring valuables to the hospital.            [facility] is not responsible for any belongings or valuables.  Do NOT Smoke (Tobacco/Vapping) or drink Alcohol 24 hours prior to your procedure If you use a CPAP at night, you may bring all equipment for your overnight stay.   Contacts, glasses, dentures or bridgework may not be worn into surgery.      For patients admitted to the hospital, discharge time will be determined by your treatment team.   Patients discharged the day of surgery will not be allowed to drive home, and someone needs to stay with them for 24 hours.    Special instructions:   [facility]- Preparing For Surgery  Before surgery, you can play an important role. Because skin is not sterile, your skin needs to be as free of germs as possible. You can reduce the number of germs on your skin by washing with CHG (chlorahexidine gluconate) Soap before surgery.  CHG is an antiseptic cleaner which kills germs and bonds with the skin to continue killing germs even after washing.    Oral Hygiene is also important to reduce your risk of infection.  Remember - BRUSH YOUR TEETH THE MORNING OF SURGERY WITH YOUR REGULAR TOOTHPASTE  Please do not use if you have an allergy to CHG or antibacterial soaps. If your skin becomes reddened/irritated stop using the CHG.  Do not shave (including legs and underarms) for at least 48 hours prior to first CHG shower. It is OK to shave your face.  Please follow these instructions carefully.   1. Shower the NIGHT BEFORE SURGERY and the MORNING OF SURGERY with CHG Soap.   2. If you chose to wash your hair, wash your hair first as usual with your normal shampoo.  3. After you shampoo, rinse your hair and body thoroughly to remove the shampoo.  4. Use CHG as you would any other liquid soap. You can apply CHG directly to the skin and wash gently with a scrungie or a clean washcloth.   5. Apply the CHG Soap to your body ONLY FROM THE NECK DOWN.  Do not use on open wounds or open sores. Avoid contact with your eyes, ears, mouth and genitals (private parts). Wash Face and genitals (private parts)  with your normal soap.   6. Wash thoroughly, paying special attention to the area where your surgery will be performed.  7. Thoroughly rinse your body with warm water from the neck down.  8. DO NOT shower/wash with your normal soap after using and rinsing off the CHG Soap.  9. Pat yourself dry with a CLEAN TOWEL.  10. Wear CLEAN PAJAMAS to bed the night before surgery, wear comfortable clothes the morning of surgery  11. Place CLEAN SHEETS on your bed the  night of your first shower and DO NOT SLEEP WITH PETS.   Day of Surgery:   Do not apply any deodorants/lotions.  Please wear clean clothes to the hospital/surgery center.   Remember to brush your teeth WITH YOUR REGULAR TOOTHPASTE.   Please read over the following fact sheets that you were given.

## 2020-01-03 ENCOUNTER — Other Ambulatory Visit: Payer: Self-pay

## 2020-01-03 ENCOUNTER — Encounter (HOSPITAL_COMMUNITY): Payer: Self-pay

## 2020-01-03 ENCOUNTER — Other Ambulatory Visit (HOSPITAL_COMMUNITY)
Admission: RE | Admit: 2020-01-03 | Discharge: 2020-01-03 | Disposition: A | Discharge status: Home / Self Care | Payer: Medicare HMO | Source: Ambulatory Visit | Attending: Otolaryngology | Admitting: Otolaryngology

## 2020-01-03 ENCOUNTER — Encounter (HOSPITAL_COMMUNITY)
Admission: RE | Admit: 2020-01-03 | Discharge: 2020-01-03 | Disposition: A | Discharge status: Home / Self Care | Payer: Medicare HMO | Source: Ambulatory Visit | Attending: Otolaryngology | Admitting: Otolaryngology

## 2020-01-03 DIAGNOSIS — C329 Malignant neoplasm of larynx, unspecified: Secondary | ICD-10-CM | POA: Insufficient documentation

## 2020-01-03 DIAGNOSIS — Z01818 Encounter for other preprocedural examination: Secondary | ICD-10-CM | POA: Insufficient documentation

## 2020-01-03 DIAGNOSIS — Z20822 Contact with and (suspected) exposure to covid-19: Secondary | ICD-10-CM | POA: Insufficient documentation

## 2020-01-03 DIAGNOSIS — Z79899 Other long term (current) drug therapy: Secondary | ICD-10-CM | POA: Insufficient documentation

## 2020-01-03 DIAGNOSIS — Z7982 Long term (current) use of aspirin: Secondary | ICD-10-CM | POA: Insufficient documentation

## 2020-01-03 HISTORY — DX: Malignant (primary) neoplasm, unspecified: C80.1

## 2020-01-03 LAB — COMPREHENSIVE METABOLIC PANEL
ALT: 14 U/L (ref 0–44)
AST: 17 U/L (ref 15–41)
Albumin: 3.6 g/dL (ref 3.5–5.0)
Alkaline Phosphatase: 41 U/L (ref 38–126)
Anion gap: 8 (ref 5–15)
BUN: 12 mg/dL (ref 8–23)
CO2: 29 mmol/L (ref 22–32)
Calcium: 9 mg/dL (ref 8.9–10.3)
Chloride: 105 mmol/L (ref 98–111)
Creatinine, Ser: 0.71 mg/dL (ref 0.61–1.24)
GFR calc Af Amer: >60 mL/min (ref >60)
GFR calc non Af Amer: >60 mL/min (ref >60)
Glucose, Bld: 93 mg/dL (ref 70–99)
Potassium: 3.4 mmol/L — ABNORMAL LOW (ref 3.5–5.1)
Sodium: 142 mmol/L (ref 135–145)
Total Bilirubin: 0.5 mg/dL (ref 0.3–1.2)
Total Protein: 7.2 g/dL (ref 6.5–8.1)

## 2020-01-03 LAB — HEMOGLOBIN A1C
Hgb A1c MFr Bld: 5.8 % — ABNORMAL HIGH (ref 4.8–5.6)
Mean Plasma Glucose: 119.76 mg/dL

## 2020-01-03 LAB — PROTIME-INR
INR: 1 (ref 0.8–1.2)
Prothrombin Time: 13.5 seconds (ref 11.4–15.2)

## 2020-01-03 LAB — CBC
HCT: 39.1 % (ref 39.0–52.0)
Hemoglobin: 12.8 g/dL — ABNORMAL LOW (ref 13.0–17.0)
MCH: 30.9 pg (ref 26.0–34.0)
MCHC: 32.7 g/dL (ref 30.0–36.0)
MCV: 94.4 fL (ref 80.0–100.0)
Platelets: 193 10*3/uL (ref 150–400)
RBC: 4.14 MIL/uL — ABNORMAL LOW (ref 4.22–5.81)
RDW: 13.5 % (ref 11.5–15.5)
WBC: 3.6 10*3/uL — ABNORMAL LOW (ref 4.0–10.5)
nRBC: 0 % (ref 0.0–0.2)

## 2020-01-03 NOTE — Progress Notes (Signed)
PCP - Dr. [name] Cardiologist - Denies  PPM/ICD - Denies  Chest x-ray - N/A EKG - 01/03/20 Stress Test - Denies ECHO - Denies Cardiac Cath - Denies  Sleep Study - Denies  Pt denies being diabetic; Hx of diabetes in pt's chart. Pt does not check CBGs or take diabetic medications.  Blood Thinner Instructions: N/A Aspirin Instructions: N/A  ERAS Protcol - No  COVID TEST- 01/03/20   Coronavirus Screening  Have you experienced the following symptoms:  Cough yes/no: No Fever (>100.61F)  yes/no: No Runny nose yes/no: No Sore throat yes/no: No Difficulty breathing/shortness of breath  yes/no: No  Have you or a family member traveled in the last 14 days and where? yes/no: No   If the patient indicates "YES" to the above questions, their PAT will be rescheduled to limit the exposure to others and, the surgeon will be notified. THE PATIENT WILL NEED TO BE ASYMPTOMATIC FOR 14 DAYS.   If the patient is not experiencing any of these symptoms, the PAT nurse will instruct them to NOT bring anyone with them to their appointment since they may have these symptoms or traveled as well.   Please remind your patients and families that hospital visitation restrictions are in effect and the importance of the restrictions.     Anesthesia review: No  Patient denies shortness of breath, fever, cough and chest pain at PAT appointment   All instructions explained to the patient, with a verbal understanding of the material. Patient agrees to go over the instructions while at home for a better understanding. Patient also instructed to self quarantine after being tested for COVID-19. The opportunity to ask questions was provided.

## 2020-01-04 LAB — SARS CORONAVIRUS 2 (TAT 6-24 HRS): SARS Coronavirus 2: NEGATIVE

## 2020-01-05 ENCOUNTER — Encounter (HOSPITAL_COMMUNITY): Payer: Self-pay | Admitting: Otolaryngology

## 2020-01-05 NOTE — Anesthesia Preprocedure Evaluation (Addendum)
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   Pulmonary exam normal breath sounds clear to auscultation       Cardiovascular negative cardio ROS Normal cardiovascular exam Rhythm:Regular Rate:Normal     Neuro/Psych PSYCHIATRIC DISORDERS Glaucoma    GI/Hepatic (+)     substance abuse  alcohol use and cocaine use, Hepatitis -, CLast Cocaine use 2019   Endo/Other  diabetes, Well Controlled  Renal/GU   negative genitourinary   Musculoskeletal negative musculoskeletal ROS (+)   Abdominal   Peds  Hematology   Anesthesia Other Findings   Reproductive/Obstetrics                           Anesthesia Physical Anesthesia Plan  ASA: III  Anesthesia Plan: General   Post-op Pain Management:    Induction: Intravenous  PONV Risk Score and Plan: 2 and Ondansetron, Midazolam, Treatment may vary due to age or medical condition and Scopolamine patch - Pre-op  Airway Management Planned: Oral ETT  Additional Equipment:   Intra-op Plan:   Post-operative Plan: Extubation in OR  Informed Consent: I have reviewed the patients History and Physical, chart, labs and discussed the procedure including the risks, benefits and alternatives for the proposed anesthesia with the patient or authorized representative who has indicated his/her understanding and acceptance.     Dental advisory given  Plan Discussed with: CRNA and Surgeon  Anesthesia Plan Comments: (1 large bore peripheral IV)     Anesthesia Quick  Evaluation

## 2020-01-06 ENCOUNTER — Inpatient Hospital Stay (HOSPITAL_COMMUNITY): Payer: Medicare HMO | Admitting: Certified Registered Nurse Anesthetist

## 2020-01-06 ENCOUNTER — Other Ambulatory Visit: Payer: Self-pay

## 2020-01-06 ENCOUNTER — Inpatient Hospital Stay (HOSPITAL_COMMUNITY)
Admission: RE | Admit: 2020-01-06 | Discharge: 2020-01-09 | DRG: 822 | Disposition: A | Discharge status: Home / Self Care | Payer: Medicare HMO | Attending: Otolaryngology | Admitting: Otolaryngology

## 2020-01-06 ENCOUNTER — Encounter (HOSPITAL_COMMUNITY)
Admission: RE | Disposition: A | Discharge status: Home / Self Care | Payer: Self-pay | Source: Home / Self Care | Attending: Otolaryngology

## 2020-01-06 DIAGNOSIS — Z79899 Other long term (current) drug therapy: Secondary | ICD-10-CM

## 2020-01-06 DIAGNOSIS — Z7982 Long term (current) use of aspirin: Secondary | ICD-10-CM | POA: Diagnosis not present

## 2020-01-06 DIAGNOSIS — Z20822 Contact with and (suspected) exposure to covid-19: Secondary | ICD-10-CM | POA: Diagnosis present

## 2020-01-06 DIAGNOSIS — F17200 Nicotine dependence, unspecified, uncomplicated: Secondary | ICD-10-CM | POA: Diagnosis present

## 2020-01-06 DIAGNOSIS — Z923 Personal history of irradiation: Secondary | ICD-10-CM | POA: Diagnosis not present

## 2020-01-06 DIAGNOSIS — C329 Malignant neoplasm of larynx, unspecified: Secondary | ICD-10-CM | POA: Diagnosis present

## 2020-01-06 DIAGNOSIS — C770 Secondary and unspecified malignant neoplasm of lymph nodes of head, face and neck: Principal | ICD-10-CM | POA: Diagnosis present

## 2020-01-06 DIAGNOSIS — C328 Malignant neoplasm of overlapping sites of larynx: Secondary | ICD-10-CM | POA: Diagnosis present

## 2020-01-06 HISTORY — PX: RADICAL NECK DISSECTION: SHX2284

## 2020-01-06 HISTORY — PX: DIRECT LARYNGOSCOPY: SHX5326

## 2020-01-06 LAB — GLUCOSE, CAPILLARY
Glucose-Capillary: 109 mg/dL — ABNORMAL HIGH (ref 70–99)
Glucose-Capillary: 136 mg/dL — ABNORMAL HIGH (ref 70–99)

## 2020-01-06 SURGERY — LARYNGOSCOPY, DIRECT
Anesthesia: General | Site: Neck

## 2020-01-06 MED ORDER — ONDANSETRON HCL 4 MG/2ML IJ SOLN
INTRAMUSCULAR | Status: DC | PRN
  Administered 2020-01-06: 4 mg via INTRAVENOUS

## 2020-01-06 MED ORDER — PROMETHAZINE HCL 25 MG/ML IJ SOLN
INTRAMUSCULAR | Status: AC
  Filled 2020-01-06: qty 1

## 2020-01-06 MED ORDER — ONDANSETRON HCL 4 MG/2ML IJ SOLN
INTRAMUSCULAR | Status: AC
  Filled 2020-01-06: qty 2

## 2020-01-06 MED ORDER — LIDOCAINE 2% (20 MG/ML) 5 ML SYRINGE
INTRAMUSCULAR | Status: DC | PRN
  Administered 2020-01-06: 50 mg via INTRAVENOUS

## 2020-01-06 MED ORDER — LABETALOL HCL 5 MG/ML IV SOLN: 5.0000 mg | Freq: Once | INTRAVENOUS | Status: AC

## 2020-01-06 MED ORDER — PROMETHAZINE HCL 25 MG PO TABS: 25.0000 mg | ORAL_TABLET | Freq: Four times a day (QID) | ORAL | Status: DC | PRN

## 2020-01-06 MED ORDER — ROCURONIUM BROMIDE 100 MG/10ML IV SOLN
INTRAVENOUS | Status: DC | PRN
  Administered 2020-01-06: 50 mg via INTRAVENOUS
  Administered 2020-01-06: 10 mg via INTRAVENOUS

## 2020-01-06 MED ORDER — LIDOCAINE-EPINEPHRINE 1 %-1:100000 IJ SOLN: INTRAMUSCULAR | Status: DC | PRN

## 2020-01-06 MED ORDER — DEXTROSE-NACL 5-0.9 % IV SOLN: INTRAVENOUS | Status: DC

## 2020-01-06 MED ORDER — LACTATED RINGERS IV SOLN: INTRAVENOUS | Status: DC | PRN

## 2020-01-06 MED ORDER — FENTANYL CITRATE (PF) 250 MCG/5ML IJ SOLN
INTRAMUSCULAR | Status: AC
  Filled 2020-01-06: qty 5

## 2020-01-06 MED ORDER — PROMETHAZINE HCL 25 MG/ML IJ SOLN
6.2500 mg | INTRAMUSCULAR | Status: DC | PRN
  Administered 2020-01-06: 6.25 mg via INTRAVENOUS

## 2020-01-06 MED ORDER — FESOTERODINE FUMARATE ER 4 MG PO TB24
4.0000 mg | ORAL_TABLET | Freq: Every day | ORAL | Status: DC
  Administered 2020-01-06 – 2020-01-09 (×4): 4 mg via ORAL
  Filled 2020-01-06 (×4): qty 1

## 2020-01-06 MED ORDER — ATROPINE SULFATE 1 % OP SOLN
1.0000 [drp] | Freq: Two times a day (BID) | OPHTHALMIC | Status: DC
  Administered 2020-01-06 – 2020-01-09 (×6): 1 [drp] via OPHTHALMIC
  Filled 2020-01-06: qty 2

## 2020-01-06 MED ORDER — BACITRACIN ZINC 500 UNIT/GM EX OINT
TOPICAL_OINTMENT | CUTANEOUS | Status: DC | PRN
  Administered 2020-01-06: 1 via TOPICAL

## 2020-01-06 MED ORDER — LIDOCAINE 2% (20 MG/ML) 5 ML SYRINGE
INTRAMUSCULAR | Status: AC
  Filled 2020-01-06: qty 5

## 2020-01-06 MED ORDER — IBUPROFEN 400 MG PO TABS
400.0000 mg | ORAL_TABLET | Freq: Four times a day (QID) | ORAL | Status: DC | PRN
  Administered 2020-01-07 – 2020-01-08 (×4): 400 mg via ORAL
  Filled 2020-01-06 (×4): qty 1

## 2020-01-06 MED ORDER — SUCCINYLCHOLINE CHLORIDE 20 MG/ML IJ SOLN
INTRAMUSCULAR | Status: DC | PRN
  Administered 2020-01-06: 100 mg via INTRAVENOUS

## 2020-01-06 MED ORDER — HYDROMORPHONE HCL 1 MG/ML IJ SOLN
0.2500 mg | INTRAMUSCULAR | Status: DC | PRN
  Administered 2020-01-06 (×2): 0.5 mg via INTRAVENOUS

## 2020-01-06 MED ORDER — MIDAZOLAM HCL 2 MG/2ML IJ SOLN
INTRAMUSCULAR | Status: AC
  Filled 2020-01-06: qty 2

## 2020-01-06 MED ORDER — PROPOFOL 10 MG/ML IV BOLUS
INTRAVENOUS | Status: AC
  Filled 2020-01-06: qty 40

## 2020-01-06 MED ORDER — LACTATED RINGERS IV SOLN
INTRAVENOUS | Status: DC | PRN
Start: 2020-01-06 — End: 2020-01-06

## 2020-01-06 MED ORDER — EPINEPHRINE HCL (NASAL) 0.1 % NA SOLN
NASAL | Status: AC
  Filled 2020-01-06: qty 30

## 2020-01-06 MED ORDER — MEPERIDINE HCL 25 MG/ML IJ SOLN: 6.2500 mg | INTRAMUSCULAR | Status: DC | PRN

## 2020-01-06 MED ORDER — PHENYLEPHRINE 40 MCG/ML (10ML) SYRINGE FOR IV PUSH (FOR BLOOD PRESSURE SUPPORT)
PREFILLED_SYRINGE | INTRAVENOUS | Status: DC | PRN
  Administered 2020-01-06 (×3): 80 ug via INTRAVENOUS

## 2020-01-06 MED ORDER — HYDROCODONE-ACETAMINOPHEN 5-325 MG PO TABS
1.0000 | ORAL_TABLET | ORAL | Status: DC | PRN
  Administered 2020-01-06 (×2): 1 via ORAL
  Administered 2020-01-07: 2 via ORAL
  Administered 2020-01-07 (×2): 1 via ORAL
  Administered 2020-01-07 – 2020-01-09 (×3): 2 via ORAL
  Filled 2020-01-06: qty 2
  Filled 2020-01-06: qty 1
  Filled 2020-01-06: qty 2
  Filled 2020-01-06: qty 1
  Filled 2020-01-06 (×2): qty 2
  Filled 2020-01-06 (×2): qty 1

## 2020-01-06 MED ORDER — SUCCINYLCHOLINE CHLORIDE 200 MG/10ML IV SOSY
PREFILLED_SYRINGE | INTRAVENOUS | Status: AC
  Filled 2020-01-06: qty 20

## 2020-01-06 MED ORDER — MIDAZOLAM HCL 5 MG/5ML IJ SOLN
INTRAMUSCULAR | Status: DC | PRN
  Administered 2020-01-06 (×2): 1 mg via INTRAVENOUS

## 2020-01-06 MED ORDER — BACITRACIN ZINC 500 UNIT/GM EX OINT
TOPICAL_OINTMENT | CUTANEOUS | Status: AC
  Filled 2020-01-06: qty 28.35

## 2020-01-06 MED ORDER — SUGAMMADEX SODIUM 200 MG/2ML IV SOLN
INTRAVENOUS | Status: DC | PRN
  Administered 2020-01-06: 200 mg via INTRAVENOUS

## 2020-01-06 MED ORDER — DEXAMETHASONE SODIUM PHOSPHATE 10 MG/ML IJ SOLN
INTRAMUSCULAR | Status: AC
  Filled 2020-01-06: qty 1

## 2020-01-06 MED ORDER — HYDROMORPHONE HCL 1 MG/ML IJ SOLN
INTRAMUSCULAR | Status: AC
  Filled 2020-01-06: qty 1

## 2020-01-06 MED ORDER — SOFOSBUVIR-VELPATASVIR 400-100 MG PO TABS: 1.0000 | ORAL_TABLET | Freq: Every day | ORAL | Status: DC

## 2020-01-06 MED ORDER — PROMETHAZINE HCL 25 MG RE SUPP
25.0000 mg | Freq: Four times a day (QID) | RECTAL | Status: DC | PRN
  Filled 2020-01-06: qty 1

## 2020-01-06 MED ORDER — 0.9 % SODIUM CHLORIDE (POUR BTL) OPTIME
TOPICAL | Status: DC | PRN
  Administered 2020-01-06: 08:00:00 1000 mL

## 2020-01-06 MED ORDER — PHENYLEPHRINE HCL-NACL 10-0.9 MG/250ML-% IV SOLN
INTRAVENOUS | Status: DC | PRN
  Administered 2020-01-06: 50 ug/min via INTRAVENOUS

## 2020-01-06 MED ORDER — FENTANYL CITRATE (PF) 250 MCG/5ML IJ SOLN
INTRAMUSCULAR | Status: DC | PRN
  Administered 2020-01-06 (×4): 50 ug via INTRAVENOUS

## 2020-01-06 MED ORDER — LATANOPROST 0.005 % OP SOLN
1.0000 [drp] | Freq: Every day | OPHTHALMIC | Status: DC
  Administered 2020-01-06 – 2020-01-07 (×2): 1 [drp] via OPHTHALMIC
  Filled 2020-01-06: qty 2.5

## 2020-01-06 MED ORDER — PROPOFOL 10 MG/ML IV BOLUS
INTRAVENOUS | Status: AC
  Filled 2020-01-06: qty 20

## 2020-01-06 MED ORDER — LABETALOL HCL 5 MG/ML IV SOLN
5.0000 mg | Freq: Once | INTRAVENOUS | Status: AC
  Administered 2020-01-06: 5 mg via INTRAVENOUS

## 2020-01-06 MED ORDER — PROPOFOL 10 MG/ML IV BOLUS
INTRAVENOUS | Status: DC | PRN
  Administered 2020-01-06: 150 mg via INTRAVENOUS

## 2020-01-06 MED ORDER — LIDOCAINE-EPINEPHRINE 1 %-1:100000 IJ SOLN
INTRAMUSCULAR | Status: AC
  Filled 2020-01-06: qty 1

## 2020-01-06 MED ORDER — DOCUSATE SODIUM 100 MG PO CAPS
200.0000 mg | ORAL_CAPSULE | Freq: Every day | ORAL | Status: DC | PRN
  Administered 2020-01-07: 200 mg via ORAL
  Filled 2020-01-06 (×2): qty 2

## 2020-01-06 MED ORDER — LABETALOL HCL 5 MG/ML IV SOLN
INTRAVENOUS | Status: AC
  Administered 2020-01-06: 5 mg via INTRAVENOUS
  Filled 2020-01-06: qty 4

## 2020-01-06 MED ORDER — EPHEDRINE SULFATE-NACL 50-0.9 MG/10ML-% IV SOSY
PREFILLED_SYRINGE | INTRAVENOUS | Status: DC | PRN
  Administered 2020-01-06 (×2): 5 mg via INTRAVENOUS

## 2020-01-06 MED ORDER — DEXAMETHASONE SODIUM PHOSPHATE 10 MG/ML IJ SOLN
INTRAMUSCULAR | Status: DC | PRN
  Administered 2020-01-06: 5 mg via INTRAVENOUS

## 2020-01-06 SURGICAL SUPPLY — 49 items
CANISTER SUCT 3000ML PPV (MISCELLANEOUS) ×4 IMPLANT
CLEANER TIP ELECTROSURG 2X2 (MISCELLANEOUS) ×4 IMPLANT
CORD BIPOLAR FORCEPS 12FT (ELECTRODE) ×4 IMPLANT
COVER BACK TABLE 60X90IN (DRAPES) ×4 IMPLANT
COVER MAYO STAND STRL (DRAPES) ×4 IMPLANT
COVER SURGICAL LIGHT HANDLE (MISCELLANEOUS) ×4 IMPLANT
COVER WAND RF STERILE (DRAPES) ×4 IMPLANT
DRAIN CHANNEL 15F RND FF W/TCR (WOUND CARE) ×2 IMPLANT
DRAPE HALF SHEET 40X57 (DRAPES) ×4 IMPLANT
ELECT COATED BLADE 2.86 ST (ELECTRODE) ×4 IMPLANT
ELECT REM PT RETURN 9FT ADLT (ELECTROSURGICAL) ×4
ELECTRODE REM PT RTRN 9FT ADLT (ELECTROSURGICAL) ×2 IMPLANT
EVACUATOR SILICONE 100CC (DRAIN) ×4 IMPLANT
FORCEPS BIPOLAR SPETZLER 8 1.0 (NEUROSURGERY SUPPLIES) ×4 IMPLANT
GAUZE 4X4 16PLY RFD (DISPOSABLE) ×8 IMPLANT
GLOVE BIO SURGEON STRL SZ 6.5 (GLOVE) ×3 IMPLANT
GLOVE BIO SURGEONS STRL SZ 6.5 (GLOVE) ×1
GLOVE ECLIPSE 7.5 STRL STRAW (GLOVE) ×6 IMPLANT
GOWN STRL REUS W/ TWL LRG LVL3 (GOWN DISPOSABLE) ×4 IMPLANT
GOWN STRL REUS W/TWL LRG LVL3 (GOWN DISPOSABLE) ×8
KIT BASIN OR (CUSTOM PROCEDURE TRAY) ×4 IMPLANT
KIT TURNOVER KIT B (KITS) ×4 IMPLANT
NDL PRECISIONGLIDE 27X1.5 (NEEDLE) ×2 IMPLANT
NEEDLE PRECISIONGLIDE 27X1.5 (NEEDLE) ×4 IMPLANT
NS IRRIG 1000ML POUR BTL (IV SOLUTION) ×4 IMPLANT
PAD ARMBOARD 7.5X6 YLW CONV (MISCELLANEOUS) ×8 IMPLANT
PATTIES SURGICAL .5 X3 (DISPOSABLE) IMPLANT
PENCIL FOOT CONTROL (ELECTRODE) ×4 IMPLANT
SHEARS HARMONIC 9CM CVD (BLADE) ×4 IMPLANT
SOL ANTI FOG 6CC (MISCELLANEOUS) IMPLANT
SOLUTION ANTI FOG 6CC (MISCELLANEOUS) ×2
SPECIMEN JAR MEDIUM (MISCELLANEOUS) IMPLANT
SPONGE INTESTINAL PEANUT (DISPOSABLE) IMPLANT
SPONGE LAP 18X18 RF (DISPOSABLE) ×2 IMPLANT
STAPLER VISISTAT 35W (STAPLE) ×4 IMPLANT
SUT CHROMIC 3 0 SH 27 (SUTURE) ×6 IMPLANT
SUT CHROMIC 5 0 P 3 (SUTURE) ×2 IMPLANT
SUT ETHILON 3 0 PS 1 (SUTURE) ×4 IMPLANT
SUT ETHILON 5 0 PS 2 18 (SUTURE) IMPLANT
SUT SILK 2 0 REEL (SUTURE) ×2 IMPLANT
SUT SILK 3 0 SH CR/8 (SUTURE) ×4 IMPLANT
SUT SILK 4 0 REEL (SUTURE) ×4 IMPLANT
SUT VIC AB 3-0 SH 18 (SUTURE) ×2 IMPLANT
TOWEL GREEN STERILE (TOWEL DISPOSABLE) ×4 IMPLANT
TRAY ENT MC OR (CUSTOM PROCEDURE TRAY) ×4 IMPLANT
TRAY FOLEY MTR SLVR 14FR STAT (SET/KITS/TRAYS/PACK) IMPLANT
TUBE CONNECTING 12'X1/4 (SUCTIONS) ×1
TUBE CONNECTING 12X1/4 (SUCTIONS) ×3 IMPLANT
TUBE FEEDING 10FR FLEXIFLO (MISCELLANEOUS) IMPLANT

## 2020-01-06 NOTE — Interval H&P Note (Signed)
History and Physical Interval Note:  01/06/2020 7:06 AM  [name]  has presented today for surgery, with the diagnosis of METASTATIC CANCER TO CERVICAL LYMPH NODES.  The various methods of treatment have been discussed with the patient and family. After consideration of risks, benefits and other options for treatment, the patient has consented to  Procedure(s): DIRECT LARYNGOSCOPY (N/A) RADICAL NECK DISSECTION (N/A) as a surgical intervention.  The patient's history has been reviewed, patient examined, no change in status, stable for surgery.  I have reviewed the patient's chart and labs.  Questions were answered to the patient's satisfaction.     Izora Gala

## 2020-01-06 NOTE — Progress Notes (Signed)
Patient ID: [name], male   DOB: 1951-02-07, 69 y.o.   MRN: [id number] Postop check.  No complaints.  He is awake and alert.  Neck looks excellent.  Voice is normal.  Shoulder shrug is normal.  Facial nerve intact.  Tongue mobility normal.  JP functioning properly.  Stable postop.  Continue care.

## 2020-01-06 NOTE — Progress Notes (Signed)
Dr. Royce Macadamia notified of patient's elevated BP  Per Dr. Royce Macadamia, give 5mg  Labetolol IV.

## 2020-01-06 NOTE — Transfer of Care (Signed)
Immediate Anesthesia Transfer of Care Note  [name]  Procedure(s) Performed: DIRECT LARYNGOSCOPY (N/A Mouth) RADICAL NECK DISSECTION (N/A Neck)  Patient Location: PACU  Anesthesia Type:General  Level of Consciousness: awake  Airway & Oxygen Therapy: Patient Spontanous Breathing and Patient connected to face mask oxygen  Post-op Assessment: Report given to RN and Post -op Vital signs reviewed and stable  Post vital signs: Reviewed  Last Vitals:  Vitals Value Taken Time  BP 157/97 01/06/20 0955  Temp 36.4 C 01/06/20 0955  Pulse 60 01/06/20 1001  Resp 15 01/06/20 1001  SpO2 100 % 01/06/20 1001  Vitals shown include unvalidated device data.  Last Pain:  Vitals:   01/06/20 0955  TempSrc:   PainSc: Asleep         Complications: No apparent anesthesia complications

## 2020-01-06 NOTE — Op Note (Signed)
OPERATIVE REPORT  DATE OF SURGERY: 01/06/2020  PATIENT:  [name],  69 y.o. male  PRE-OPERATIVE DIAGNOSIS:  METASTATIC CANCER TO CERVICAL LYMPH NODES  POST-OPERATIVE DIAGNOSIS:  METASTATIC CANCER TO CERVICAL LYMPH NODES  PROCEDURE:  Procedure(s): DIRECT LARYNGOSCOPY MODIFIED RADICAL NECK DISSECTION  SURGEON:  [name], MD  ASSISTANTS: Jolene Provost PA  ANESTHESIA:   General   EBL: 50 ml  DRAINS: 15 round JP  LOCAL MEDICATIONS USED:  None  SPECIMEN: Right modified neck dissection including levels 2 and 3, including the internal jugular vein, suture marks the inferior jugular vein stump.  COUNTS:  Correct  PROCEDURE DETAILS: The patient was taken to the operating room and placed on the operating table in the supine position. Following induction of general endotracheal anesthesia, the table was turned 90 degrees and the patient was draped in standard fashion for endoscopy.  1.  Direct laryngoscopy.  The Dedo laryngoscope was used to evaluate the larynx and hypopharynx.  There is significant edema of the epiglottis.  The cords were clean.  The post cricoid area was cleaned.  The piriform sinus was free of any ulceration or visible mass.  There was diffuse edema throughout the piriform sinus on the right.  No other masses were identified.  Biopsies were not taken.  2.  Modified radical neck dissection.  Patient was then reprepped and draped for neck surgery.  Incision was outlined with a marking pen starting at the right mastoid tip down the neck in an oblique fashion and turning anteriorly at the level of the mid thyroid cartilage.  Electrocautery was used to incise the skin and subcutaneous tissue and through the platysma layer.  Subplatysmal flaps were elevated superiorly to the angle of the mandible and inferiorly to the omohyoid muscle.  Self-retaining retractor was used throughout the case.  The external jugular veins were ligated between clamps and divided.  The  right auricular nerve was preserved and reflected posteriorly.  The fascia was dissected off of the sternocleidomastoid muscle and brought forward.  A cuff of sternocleidomastoid muscle was taken with the specimen due to the severe fibrotic nature and the extent of the disease and level 2.  The omohyoid muscle marked inferior to the level of dissection.  The internal jugular vein was identified and dissected out.  The inferior aspect was ligated between clamps and divided.  A 2-0 suture ligature and 2 oh free ties were used on the lower stump.  The common carotid and vagus nerve were identified and preserved.  All of the lymph node bearing tissue was then dissected up superiorly towards the digastric muscle.  The spinal accessory nerve was identified and dissected free.  The soft tissue posterior lateral to the nerve was dissected down to the splenius capitis and levator scapular muscles and brought forward.  The parotid tail was dissected up superiorly and the digastric muscle was exposed.  The upper internal jugular vein was then ligated between clamps and divided.  A 2-0 suture ligature and free tie were used on the superior stump.  The hypoglossal nerve was identified and preserved.  The specimen was delivered and sent for pathologic evaluation.  The wound was irrigated with saline.  Hemostasis was assured.  The drain was exited through a separate stab incision secured in place with a nylon suture.  Incision was reapproximated using interrupted 3-0 chromic sutures on the platysmal layer and staples on the skin.  Bacitracin was applied.  The drain was charged.  Patient was awakened  extubated and transferred to recovery in stable condition.    PATIENT DISPOSITION:  To PACU, stable

## 2020-01-06 NOTE — Anesthesia Procedure Notes (Signed)
Procedure Name: Intubation Date/Time: 01/06/2020 7:45 AM Performed by: Janene Harvey, CRNA Pre-anesthesia Checklist: Patient identified, Emergency Drugs available, Suction available and Patient being monitored Patient Re-evaluated:Patient Re-evaluated prior to induction Oxygen Delivery Method: Circle system utilized Preoxygenation: Pre-oxygenation with 100% oxygen Induction Type: IV induction and Rapid sequence Laryngoscope Size: Glidescope and 3 Grade View: Grade I Tube type: Oral Tube size: 7.0 mm Number of attempts: 1 Airway Equipment and Method: Stylet and Oral airway Placement Confirmation: ETT inserted through vocal cords under direct vision,  positive ETCO2 and breath sounds checked- equal and bilateral Secured at: 21 cm Tube secured with: Tape Dental Injury: Teeth and Oropharynx as per pre-operative assessment

## 2020-01-06 NOTE — Anesthesia Postprocedure Evaluation (Signed)
Anesthesia Post Note  Patient: Eron E Pillard  Procedure(s) Performed: DIRECT LARYNGOSCOPY (N/A Mouth) RADICAL NECK DISSECTION (N/A Neck)     Patient location during evaluation: PACU Anesthesia Type: General Level of consciousness: awake and alert and oriented Pain management: pain level controlled Vital Signs Assessment: post-procedure vital signs reviewed and stable Respiratory status: spontaneous breathing, nonlabored ventilation and respiratory function stable Cardiovascular status: blood pressure returned to baseline and stable Postop Assessment: no apparent nausea or vomiting Anesthetic complications: no    Last Vitals:  Vitals:   01/06/20 0700 01/06/20 0955  BP: (!) 163/101 (!) 157/97  Pulse:  65  Resp:  14  Temp:  36.4 C  SpO2:  100%    Last Pain:  Vitals:   01/06/20 0955  TempSrc:   PainSc: Asleep                 Kaileen Bronkema A.

## 2020-01-06 NOTE — Progress Notes (Signed)
Pt has finished his 3 months of Epclusa so he will not need it here anymore.   Onnie Boer, PharmD, BCIDP, AAHIVP, CPP Infectious Disease Pharmacist 01/06/2020 2:16 PM

## 2020-01-07 MED ORDER — MAGNESIUM HYDROXIDE 400 MG/5ML PO SUSP
30.0000 mL | Freq: Every day | ORAL | Status: DC | PRN
  Filled 2020-01-07: qty 30

## 2020-01-07 NOTE — Plan of Care (Signed)

## 2020-01-07 NOTE — Progress Notes (Signed)
Patient ID: [name], male   DOB: 1951-02-06, 69 y.o.   MRN: [id number] Subjective: Complains of constipation otherwise doing well.  Objective: Vital signs in last 24 hours: Temp:  [97.6 F (36.4 C)-98.8 F (37.1 C)] 98.7 F (37.1 C) (04/24 0422) Pulse Rate:  [49-65] 58 (04/24 0422) Resp:  [12-25] 17 (04/24 0422) BP: (115-168)/(35-104) 161/89 (04/24 0422) SpO2:  [95 %-100 %] 100 % (04/24 0422) Weight change:  Last BM Date: 01/05/20  Intake/Output from previous day: 04/23 0701 - 04/24 0700 In: 2656.5 [P.O.:490; I.V.:2166.5] Out: 1213 [Urine:1100; Drains:63; Blood:50] Intake/Output this shift: Total I/O In: -  Out: 200 [Urine:200]  PHYSICAL EXAM: Breathing and voice are clear.  Neck looks excellent.  JP drain functioning.  Lab Results: No results for input(s): WBC, HGB, HCT, PLT in the last 72 hours. BMET No results for input(s): NA, K, CL, CO2, GLUCOSE, BUN, CREATININE, CALCIUM in the last 72 hours.  Studies/Results: No results found.  Medications: I have reviewed the patient's current medications.  Assessment/Plan: Stable postop, continue with JP drain and with hospital care until the drain comes out.  LOS: 1 day   Izora Gala 01/07/2020, 8:53 AM

## 2020-01-07 NOTE — Plan of Care (Signed)
  Problem: Clinical Measurements: Goal: Ability to maintain clinical measurements within normal limits will improve Outcome: Progressing   Problem: Activity: Goal: Risk for activity intolerance will decrease Outcome: Progressing   Problem: Education: Goal: Knowledge of General Education information will improve Description: Including pain rating scale, medication(s)/side effects and non-pharmacologic comfort measures Outcome: Progressing   Problem: Pain Managment: Goal: General experience of comfort will improve Outcome: Progressing   Problem: Nutrition: Goal: Adequate nutrition will be maintained Outcome: Progressing

## 2020-01-08 NOTE — Progress Notes (Signed)
Patient ID: [name], male   DOB: 03-12-1951, 69 y.o.   MRN: [id number]  Postop day 2, doing great, no complaints.  He is lying in bed awake and alert watching TV.  He has been up walking around a lot.  Incision looks excellent.  JP drain is functioning.  60 cc last 24 hours.  Stable postop, continue with drain, possibly remove drain tomorrow and discharge home then.

## 2020-01-09 LAB — SURGICAL PATHOLOGY

## 2020-01-09 MED ORDER — PROMETHAZINE HCL 25 MG RE SUPP: 25.0000 mg | Freq: Four times a day (QID) | RECTAL | 1 refills | Status: DC | PRN

## 2020-01-09 MED ORDER — HYDROCODONE-ACETAMINOPHEN 7.5-325 MG PO TABS: 1.0000 | ORAL_TABLET | Freq: Four times a day (QID) | ORAL | 0 refills | Status: DC | PRN

## 2020-01-09 NOTE — Progress Notes (Signed)
Patient discharged to home with instructions and some dressing supplies. 

## 2020-01-09 NOTE — Discharge Instructions (Signed)
Keep the staple line clean and dry. Apply antibiotic ointment twice daily.

## 2020-01-09 NOTE — Discharge Summary (Signed)
Physician Discharge Summary  Patient ID: [name] MRN: [id number] DOB/AGE: 17-Jun-1951 69 y.o.  Admit date: 01/06/2020 Discharge date: 01/09/2020  Admission Diagnoses: Laryngeal cancer with cervical metastasis  Discharge Diagnoses:  Active Problems:   Laryngeal cancer [facility])   Discharged Condition: good  Hospital Course: No complications  Consults: none  Significant Diagnostic Studies: none  Treatments: surgery: Right modified radical neck dissection  Discharge Exam: Blood pressure (!) 135/91, pulse 62, temperature 98.6 F (37 C), temperature source Oral, resp. rate 17, height 5\' 8"  (1.727 m), weight 64.1 kg, SpO2 100 %. PHYSICAL EXAM: Awake and alert, breathing and speaking clearly, no trouble swallowing. Surgical incision is healing nicely with staples intact. JP drain removed on day of discharge.  Disposition: Discharge disposition: 01-Home or Self Care       Discharge Instructions    Diet - low sodium heart healthy   Complete by: As directed    Increase activity slowly   Complete by: As directed      Allergies as of 01/09/2020   No Known Allergies     Medication List    STOP taking these medications   acetaminophen 500 MG tablet Commonly known as: TYLENOL   HYDROcodone-acetaminophen 7.5-325 mg/15 ml solution Commonly known as: HYCET Replaced by: HYDROcodone-acetaminophen 7.5-325 MG tablet   traMADol 50 MG tablet Commonly known as: ULTRAM     TAKE these medications   atropine 1 % ophthalmic solution Place 1 drop into the right eye 2 (two) times a day.   benzonatate 200 MG capsule Commonly known as: TESSALON Take 1 capsule (200 mg total) by mouth 3 (three) times daily as needed for cough.   cetirizine 10 MG tablet Commonly known as: ZYRTEC Take 10 mg by mouth daily as needed for allergies.   docusate sodium 100 MG capsule Commonly known as: Colace Take 2 capsules (200 mg total) by mouth daily. To prevent constipation while on pain  meds. What changed:   when to take this  reasons to take this   gabapentin 300 MG capsule Commonly known as: NEURONTIN Take 1 capsule (300 mg total) by mouth 3 (three) times daily.   HYDROcodone-acetaminophen 7.5-325 MG tablet Commonly known as: Norco Take 1 tablet by mouth every 6 (six) hours as needed for moderate pain. Replaces: HYDROcodone-acetaminophen 7.5-325 mg/15 ml solution   ibuprofen 200 MG tablet Commonly known as: ADVIL Take 400 mg by mouth every 6 (six) hours as needed for headache or moderate pain.   latanoprost 0.005 % ophthalmic solution Commonly known as: XALATAN Place 1 drop into the right eye at bedtime.   lidocaine 2 % solution Commonly known as: XYLOCAINE Patient: Mix 1part 2% viscous lidocaine, 1part H20. Swallow 11mL of diluted mixture, 58min before meals and at bedtime, up to QID   promethazine 25 MG suppository Commonly known as: PHENERGAN Place 1 suppository (25 mg total) rectally every 6 (six) hours as needed for nausea or vomiting.   Sofosbuvir-Velpatasvir 400-100 MG Tabs Commonly known as: Epclusa Take 1 tablet by mouth daily. Take 1 tablet by mouth daily. What changed: additional instructions   tolterodine 4 MG 24 hr capsule Commonly known as: DETROL LA Take 4 mg by mouth daily.      Follow-up Information    Izora Gala, MD. Schedule an appointment as soon as possible for a visit in 1 week(s).   Specialty: Otolaryngology Contact information: [address] [place] [address] [phone]           Signed:  Izora Gala 01/09/2020, 11:11 AM

## 2020-01-12 ENCOUNTER — Ambulatory Visit (INDEPENDENT_AMBULATORY_CARE_PROVIDER_SITE_OTHER): Payer: Medicare HMO | Admitting: Pharmacist

## 2020-01-12 ENCOUNTER — Other Ambulatory Visit: Payer: Self-pay

## 2020-01-12 DIAGNOSIS — B182 Chronic viral hepatitis C: Secondary | ICD-10-CM | POA: Diagnosis not present

## 2020-01-12 NOTE — Progress Notes (Signed)
HPI: [name] is a 69 y.o. male who presents to the [facility] for Hepatitis C follow-up.  Medication: Epclusa x 12 weeks  Start Date: 09/28/2019  Hepatitis C Genotype: 1a  Fibrosis Score: F4  Hepatitis C RNA: 22 on 10/26/2019 (originally [id number] on 09/06/2019)  Patient Active Problem List   Diagnosis Date Noted  . Laryngeal cancer ([place]) 01/06/2020  . Chronic hepatitis C without hepatic coma ([place]) 06/09/2019  . 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 Squamous cell carcinoma of neck 04/01/2019    Patient's Medications  New Prescriptions   No medications on file  Previous Medications   ATROPINE 1 % OPHTHALMIC SOLUTION    Place 1 drop into the right eye 2 (two) times a day.    BENZONATATE (TESSALON) 200 MG CAPSULE    Take 1 capsule (200 mg total) by mouth 3 (three) times daily as needed for cough.   CETIRIZINE (ZYRTEC) 10 MG TABLET    Take 10 mg by mouth daily as needed for allergies.   DOCUSATE SODIUM (COLACE) 100 MG CAPSULE    Take 2 capsules (200 mg total) by mouth daily. To prevent constipation while on pain meds.   GABAPENTIN (NEURONTIN) 300 MG CAPSULE    Take 1 capsule (300 mg total) by mouth 3 (three) times daily.   HYDROCODONE-ACETAMINOPHEN (NORCO) 7.5-325 MG TABLET    Take 1 tablet by mouth every 6 (six) hours as needed for moderate pain.   IBUPROFEN (ADVIL) 200 MG TABLET    Take 400 mg by mouth every 6 (six) hours as needed for headache or moderate pain.   LATANOPROST (XALATAN) 0.005 % OPHTHALMIC SOLUTION    Place 1 drop into the right eye at bedtime.    LIDOCAINE (XYLOCAINE) 2 % SOLUTION    Patient: Mix 1part 2% viscous lidocaine, 1part H20. Swallow 92mL of diluted mixture, 59min before meals and at bedtime, up to QID   PROMETHAZINE (PHENERGAN) 25 MG SUPPOSITORY    Place 1 suppository (25 mg total) rectally every 6 (six) hours as needed for nausea or vomiting.   SOFOSBUVIR-VELPATASVIR (EPCLUSA) 400-100 MG TABS    Take 1 tablet  by mouth daily. Take 1 tablet by mouth daily.   TOLTERODINE (DETROL LA) 4 MG 24 HR CAPSULE    Take 4 mg by mouth daily.  Modified Medications   No medications on file  Discontinued Medications   No medications on file    Allergies: No Known Allergies  Past Medical History: Past Medical History:  Diagnosis Date  . Cancer ([place])    Throat cancer 2019  . Diabetes mellitus without complication ([facility])    no meds  . ETOH abuse   . Frequent urination   . Glaucoma   . Hepatitis C virus infection without hepatic coma    dx'ed in 11/2018  . History of radiation therapy 05/12/19- 07/06/19   Larynx  . Wears denture    upper only; lost lower denture    Social History: Social History   Socioeconomic History  . Marital status: Single    Spouse name: Not on file  . Number of children: 2  . Years of education: Not on file  . Highest education level: Not on file  Occupational History  . Not on file  Tobacco Use  . Smoking status: Current Some Day Smoker    Years: 50.00    Types: Cigarettes  . Smokeless tobacco: Never Used  Substance and Sexual Activity  .  Alcohol use: Yes    Alcohol/week: 4.0 standard drinks    Types: 4 Cans of beer per week    Comment: four 40oz per week  . Drug use: Yes    Types: Cocaine    Comment: hasnt used "in about a year"  . Sexual activity: Not on file  Other Topics Concern  . Not on file  Social History Narrative   Patient is divorced with 2 children.   Patient is currently living with his sister.   Patient with a history of smoking a third of pack of cigarettes daily for 50 years.  Patient currently smoking 2 to 3 cigarettes/day.   Patient has never used smokeless tobacco.   Patient with occasional use of alcohol.   Patient last used cocaine approximately 6 months ago.  Patient denies use of marijuana.   Social Determinants of Health   Financial Resource Strain:   . Difficulty of Paying Living Expenses:   Food Insecurity:   . Worried About  Charity fundraiser in the Last Year:   . Arboriculturist in the Last Year:   Transportation Needs: No Transportation Needs  . Lack of Transportation (Medical): No  . Lack of Transportation (Non-Medical): No  Physical Activity:   . Days of Exercise per Week:   . Minutes of Exercise per Session:   Stress:   . Feeling of Stress :   Social Connections:   . Frequency of Communication with Friends and Family:   . Frequency of Social Gatherings with Friends and Family:   . Attends Religious Services:   . Active Member of Clubs or Organizations:   . Attends Archivist Meetings:   Marland Kitchen Marital Status:     Labs: Hepatitis C Lab Results  Component Value Date   HCVGENOTYPE 1a 12/13/2018   HEPCAB REACTIVE (A) 11/30/2018   HCVRNAPCRQN 22 (H) 10/26/2019   HCVRNAPCRQN 8,210,000 (H) 09/06/2019   HCVRNAPCRQN 2,120,000 (H) 11/30/2018   FIBROSTAGE F4 09/06/2019   Hepatitis B Lab Results  Component Value Date   HEPBSAB NON-REACTIVE 11/30/2018   HEPBSAG NON-REACTIVE 11/30/2018   Hepatitis A Lab Results  Component Value Date   HAV NON-REACTIVE 11/30/2018   HIV Lab Results  Component Value Date   HIV NON-REACTIVE 09/06/2019   Lab Results  Component Value Date   CREATININE 0.71 01/03/2020   CREATININE 0.69 11/10/2019   CREATININE 0.61 (L) 10/26/2019   CREATININE 0.74 05/04/2019   CREATININE 0.64 04/01/2019   Lab Results  Component Value Date   AST 17 01/03/2020   AST 25 11/10/2019   AST 19 10/26/2019   ALT 14 01/03/2020   ALT 19 11/10/2019   ALT 16 10/26/2019   INR 1.0 01/03/2020   INR 1.1 09/06/2019   INR 1.03 01/03/2018    Assessment: [name] is here today for his end-of-treatment HCV visit. He started [place] in January and since then has missed several doses. He finished his therapy on Monday, April 26th. The last week that he took it, he said that he may have missed 3-4 doses. He has complaints of a headache today and said that this usually occurs when his blood  pressure is high. His blood pressure was measured at 144/85 today. We instructed him to call 911 if his headache persists or gets worse. He recently had surgery on 01/06/2020 and has sutures along the right side of his head--no issues regarding this, his incision was clean, and appears to be healing well.   Today we  will check his HCV RNA and schedule his SVR appointment with Marya Amsler on 04/30/2020 at 8:45 am. We advised him to call the clinic if he needs to reschedule for any reason. All questions and concerns were addressed, and we congratulated him on completing his HCV therapy.   Plan: - Check HCV RNA  - Will f/u with patient with results - Patient will return for SVR visit on 04/30/2020 at 8:45 am  Agnes Lawrence, PharmD PGY1 Pharmacy Resident [facility] 01/12/2020, 11:26 AM

## 2020-01-14 LAB — HEPATITIS C RNA QUANTITATIVE
HCV Quantitative Log: <1.18 Log IU/mL
HCV RNA, PCR, QN: <15 IU/mL

## 2020-01-30 ENCOUNTER — Telehealth: Payer: Self-pay

## 2020-01-30 NOTE — Telephone Encounter (Signed)
Nutrition  Received call from [place], caregiver requesting another case of ensure enlive.  RD took case to radiation and left with nursing as patient will see Dr. Isidore Moos tomorrow for appointment.   Ezequias Lard B. Zenia Resides, [place], [place] Registered Dietitian [phone] (pager)

## 2020-01-30 NOTE — Progress Notes (Signed)
Mr. Winkeler presents for follow-up after completing radiation to the supraglottis on 07/06/2019.  Follow-up PET scan revealed an active right level 2 lymph node. FNA was positive for metastatic cancer.  PET scan  10/27/2019 FINAL MICROSCOPIC DIAGNOSIS:  A. LYMPH NODE, RIGHT CERVICAL:  - Poorly differentiated carcinoma consistent with metastatic squamous  cell carcinoma.  - See comment.  COMMENT:  The carcinoma is positive with cytokeratin AE1/AE3, cytokeratin 5/6, p63  and p40 and is negative with TTF-1. The immunophenotype is consistent  with squamous cell carcinoma.  Recent biopsy revealed: 11/21/2019 FINAL MICROSCOPIC DIAGNOSIS:  A. LYMPH NODE, RIGHT CERVICAL:  - Poorly differentiated carcinoma consistent with metastatic squamous  cell carcinoma.  - See comment.  COMMENT:  The carcinoma is positive with cytokeratin AE1/AE3, cytokeratin 5/6, p63  and p40 and is negative with TTF-1. The immunophenotype is consistent  with squamous cell carcinoma.  Surgery 01/06/2020 Dr. Izora Gala DIRECT LARYNGOSCOPY MODIFIED RADICAL NECK DISSECTION  Pain issues, if any: has 5/10 pain that he described as shooting Using a feeding tube?: no Weight changes, if any:  Filed Weights   01/31/20 1330  Weight: 139 lb 3.2 oz (63.1 kg)    Swallowing issues, if any: some Smoking or chewing tobacco? No smoking or chewing tobacco Using fluoride trays daily? no Last ENT visit was on: 01/17/2020 Dr. Izora Gala: "No complaints, doing well. Suture line intact. Staples removed. Mild edema above the incision but no hematoma or seroma. Stable postop. Follow-up 2 months or sooner as needed."  Other notable issues, if any: none  BP 110/84 (BP Location: Left Arm, Patient Position: Sitting, Cuff Size: Normal)   Pulse 84   Temp 98.1 F (36.7 C)   Resp 18   Ht 5\' 8"  (1.727 m)   Wt 139 lb 3.2 oz (63.1 kg)   SpO2 100%   BMI 21.17 kg/m  Patient in for follow up after radical neck dissection. Questions  and concerns addressed. Case of Ensure given.

## 2020-01-31 ENCOUNTER — Encounter: Payer: Self-pay | Admitting: Radiation Oncology

## 2020-01-31 ENCOUNTER — Ambulatory Visit
Admission: RE | Admit: 2020-01-31 | Discharge: 2020-01-31 | Disposition: A | Discharge status: Home / Self Care | Payer: Medicare HMO | Source: Ambulatory Visit | Attending: Radiation Oncology | Admitting: Radiation Oncology

## 2020-01-31 ENCOUNTER — Other Ambulatory Visit: Payer: Self-pay

## 2020-01-31 VITALS — BP 110/84 | HR 84 | Temp 98.1°F | Resp 18 | Ht 68.0 in | Wt 139.2 lb

## 2020-01-31 DIAGNOSIS — C4442 Squamous cell carcinoma of skin of scalp and neck: Secondary | ICD-10-CM

## 2020-01-31 DIAGNOSIS — C321 Malignant neoplasm of supraglottis: Secondary | ICD-10-CM | POA: Insufficient documentation

## 2020-01-31 DIAGNOSIS — C329 Malignant neoplasm of larynx, unspecified: Secondary | ICD-10-CM

## 2020-01-31 LAB — TSH: TSH: 0.905 u[IU]/mL (ref 0.320–4.118)

## 2020-01-31 NOTE — Progress Notes (Signed)
Radiation Oncology         [phone] ________________________________  Name: [name] MRN: [id number]  Date: 01/31/2020  DOB: May 07, 1951  Follow-Up Visit Note outpatient in person  CC: [name], MD  [name], MD  Diagnosis and Prior Radiotherapy:       ICD-10-CM   1. Laryngeal cancer ([place])  C32.9   2. Squamous cell carcinoma of neck  C44.42   3. 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  Since his last visit with me, a biopsy was obtained from his right neck and unfortunately revealed carcinoma and a right neck node (clinically, his nodes had been negative, however I had treated his neck with radiotherapy empirically).  He underwent right modified neck dissection by Dr. Constance Holster in April 23rd revealing the pathology below.  He had a postoperative visit on 01/17/2020 with Dr. Constance Holster and anticipates follow-up again with him in 2 months.  The patient reports some thickening in his right neck since healing from surgery.  He is not sure if he has a right neck mass and would like me to examine that area.  He denies any other issues.  Smoking or chewing tobacco? No smoking or chewing tobacco   FINAL MICROSCOPIC DIAGNOSIS:   A. LYMPH NODE, RIGHT MODIFIED, DISSECTION:  - Metastatic squamous cell carcinoma in four of nineteen lymph nodes  (4/19).  - Largest metastatic nodule is 3.1 cm with 5 mm extra-nodal extension.  - Lymphovascular space  involvement in peri-nodal connective tissue.  - See comment.   COMMENT:   There are nine (9) level 2 lymph nodes isolated and one contains  metastatic carcinoma which is 3.1 cm in greatest dimension and has 5 mm  of extra-nodal extension. There are ten (10) level 3 lymph nodes  isolated and three contain metastatic carcinoma with the largest being  2.7 cm with 3 mm extra-nodal extension.   ALLERGIES:  has No Known Allergies.  Meds: Current Outpatient Medications  Medication Sig Dispense Refill  . atropine 1 % ophthalmic solution Place 1 drop into the right eye 2 (two) times a day.     . cetirizine (ZYRTEC) 10 MG tablet Take 10 mg by mouth daily as needed for allergies.    Marland Kitchen HYDROcodone-acetaminophen (NORCO) 7.5-325 MG tablet Take 1 tablet by mouth every 6 (six) hours as needed for moderate pain. 20 tablet 0  . ibuprofen (ADVIL) 200 MG tablet Take 400 mg by mouth every 6 (six) hours as needed for headache or moderate pain.    Marland Kitchen latanoprost (XALATAN) 0.005 % ophthalmic solution Place 1 drop into the right eye at bedtime.     . tolterodine (DETROL LA) 4 MG 24 hr capsule Take 4 mg by mouth daily.    Marland Kitchen lidocaine (XYLOCAINE) 2 % solution Patient: Mix 1part 2% viscous lidocaine, 1part H20. Swallow 79mL of diluted mixture, 71min before meals and at bedtime, up to QID (Patient not taking: Reported on 01/31/2020) 150 mL 3  No current facility-administered medications for this encounter.    Physical Findings:    Vitals:   01/31/20 1330  BP: 110/84  Pulse: 84  Resp: 18  Temp: 98.1 F (36.7 C)  SpO2: 100%    Wt Readings from Last 3 Encounters:  01/31/20 139 lb 3.2 oz (63.1 kg)  01/06/20 141 lb 6.4 oz (64.1 kg)  01/03/20 141 lb 6.4 oz (64.1 kg)    height is 5\' 8"  (1.727 m) and weight is 139 lb 3.2 oz (63.1 kg). His temperature is 98.1 F (36.7 C). His blood pressure is 110/84 and his pulse is 84. His respiration is 18 and oxygen saturation is 100%. .  General: Alert and oriented, in  no acute distress HEENT: No lesions visualized in the upper throat or in the mouth.  No thrush. Neck: No palpable adenopathy.  He has posterior surgical scar tissue along the right neck.  No concerning masses. Skin: No sign of skin recurrence along the neck MSK: Ambulatory   Lab Findings: Lab Results  Component Value Date   WBC 3.6 (L) 01/03/2020   HGB 12.8 (L) 01/03/2020   HCT 39.1 01/03/2020   MCV 94.4 01/03/2020   PLT 193 01/03/2020    Lab Results  Component Value Date   TSH 0.905 01/31/2020    Radiographic Findings: No results found.  Impression/Plan:    1) Head and Neck Cancer Status: Status post salvage right neck dissection in April, recovering well  2) Nutritional Status: stable per patient report Wt Readings from Last 3 Encounters:  01/31/20 139 lb 3.2 oz (63.1 kg)  01/06/20 141 lb 6.4 oz (64.1 kg)  01/03/20 141 lb 6.4 oz (64.1 kg)    3) Risk Factors: Patient denies smoking or chewing tobacco.  I urged him to continue to stay away from these substances   4) Swallowing: continue SLP as recommended, functional  5) Thyroid function: Within normal limits today, check annually  Lab Results  Component Value Date   TSH 0.905 01/31/2020    6) Dental: endentulous.  7)  Follow-up in 2 months with otolaryngology and in 6 months with me.  He knows not to hesitate to call if he has any issues in the interim.  On date of service, in total, I spent 25 minutes on this encounter.  Patient was seen in person.  _____________________________________   [name], MD

## 2020-01-31 NOTE — Patient Instructions (Signed)
Coronavirus (COVID-19) Are you at risk?  Are you at risk for the Coronavirus (COVID-19)?  To be considered HIGH RISK for Coronavirus (COVID-19), you have to meet the following criteria:  . Traveled to [place], [place], [place], [place] or [place]; or in the [place] to [place], [place], [place], or [place]; and have fever, cough, and shortness of breath within the last 2 weeks of travel OR . Been in close contact with a person diagnosed with COVID-19 within the last 2 weeks and have fever, cough, and shortness of breath . IF YOU DO NOT MEET THESE CRITERIA, YOU ARE CONSIDERED LOW RISK FOR COVID-19.  What to do if you are HIGH RISK for COVID-19?  . If you are having a medical emergency, call 911. . Seek medical care right away. Before you go to a doctor's office, urgent care or emergency department, call ahead and tell them about your recent travel, contact with someone diagnosed with COVID-19, and your symptoms. You should receive instructions from your physician's office regarding next steps of care.  . When you arrive at healthcare provider, tell the healthcare staff immediately you have returned from visiting [place], [place], [place], [place] or [place]; or traveled in the [place] to [place], [place], [place], or [place]; in the last two weeks or you have been in close contact with a person diagnosed with COVID-19 in the last 2 weeks.   . Tell the health care staff about your symptoms: fever, cough and shortness of breath. . After you have been seen by a medical provider, you will be either: o Tested for (COVID-19) and discharged home on quarantine except to seek medical care if symptoms worsen, and asked to  - Stay home and avoid contact with others until you get your results (4-5 days)  - Avoid travel on public transportation if possible (such as bus, train, or airplane) or o Sent to the Emergency Department by EMS for evaluation, COVID-19 testing, and possible  admission depending on your condition and test results.  What to do if you are LOW RISK for COVID-19?  Reduce your risk of any infection by using the same precautions used for avoiding the common cold or flu:  . Wash your hands often with soap and warm water for at least 20 seconds.  If soap and water are not readily available, use an alcohol-based hand sanitizer with at least 60% alcohol.  . If coughing or sneezing, cover your mouth and nose by coughing or sneezing into the elbow areas of your shirt or coat, into a tissue or into your sleeve (not your hands). . Avoid shaking hands with others and consider head nods or verbal greetings only. . Avoid touching your eyes, nose, or mouth with unwashed hands.  . Avoid close contact with people who are sick. . Avoid places or events with large numbers of people in one location, like concerts or sporting events. . Carefully consider travel plans you have or are making. . If you are planning any travel outside or inside the [place], visit the CDC's Travelers' Health webpage for the latest health notices. . If you have some symptoms but not all symptoms, continue to monitor at home and seek medical attention if your symptoms worsen. . If you are having a medical emergency, call 911.   ADDITIONAL HEALTHCARE OPTIONS FOR PATIENTS  [facility] / e-Visit: [email or web address]         [facility]: [phone]  [facility]: [phone]                   [facility]: [phone]   

## 2020-02-06 ENCOUNTER — Telehealth: Payer: Self-pay | Admitting: *Deleted

## 2020-02-06 NOTE — Telephone Encounter (Signed)
CALLED PATIENT TO INFORM OF FU WITH DR. Isidore Moos ON 07-31-20 @ 2 PM, SPOKE WITH PATIENT AND HE IS AWARE OF THIS APPT.

## 2020-04-30 ENCOUNTER — Ambulatory Visit (INDEPENDENT_AMBULATORY_CARE_PROVIDER_SITE_OTHER): Payer: Medicare HMO | Admitting: Family

## 2020-04-30 ENCOUNTER — Other Ambulatory Visit: Payer: Self-pay

## 2020-04-30 ENCOUNTER — Encounter: Payer: Self-pay | Admitting: Family

## 2020-04-30 VITALS — BP 134/86 | HR 70 | Temp 98.1°F | Ht 66.0 in | Wt 137.0 lb

## 2020-04-30 DIAGNOSIS — B182 Chronic viral hepatitis C: Secondary | ICD-10-CM | POA: Diagnosis not present

## 2020-04-30 NOTE — Patient Instructions (Signed)
Nice to see you.  We will check your lab work today.  If the viral load is undetectable than your Hepatitis C is cured with no further treatment necessary.  Your fibrosis score was elevated, however the ultrasound of your abdomen looked ok. Would recommend continued screenings every 6-12 months for liver cancer that can be done with your primary care provider.  Should you need to be screened in the future it will require Hepatitis C RNA level as the antibody test will always be positive.  Please let us know if you have any questions.  Have a great day and stay safe!

## 2020-04-30 NOTE — Progress Notes (Signed)
Subjective:    Patient ID: [name], male    DOB: 09-24-1950, 69 y.o.   MRN: [id number]  Chief Complaint  Patient presents with  . Hepatitis C     HPI:  [name] is a 69 y.o. male with Hepatitis C who was last seen on 01/12/20 for his end of treatment follow up by the pharmacy staff with good adherence and tolerance to his treatment regimen of Epclusa. Here today for SVR visit.   [name] completed his 12-week course of Epclusa at the end of April. He has been doing okay since his last office visit and continues to receive treatment for laryngeal cancer and squamous cell carcinoma of the neck. No symptoms of abdominal pain, nausea, vomiting, scleral icterus, or jaundice.    No Known Allergies    Outpatient Medications Prior to Visit  Medication Sig Dispense Refill  . atropine 1 % ophthalmic solution Place 1 drop into the right eye 2 (two) times a day.     . cetirizine (ZYRTEC) 10 MG tablet Take 10 mg by mouth daily as needed for allergies.    Marland Kitchen ibuprofen (ADVIL) 200 MG tablet Take 400 mg by mouth every 6 (six) hours as needed for headache or moderate pain.    Marland Kitchen latanoprost (XALATAN) 0.005 % ophthalmic solution Place 1 drop into the right eye at bedtime.     . lidocaine (XYLOCAINE) 2 % solution Patient: Mix 1part 2% viscous lidocaine, 1part H20. Swallow 51mL of diluted mixture, 51min before meals and at bedtime, up to QID 150 mL 3  . tolterodine (DETROL LA) 4 MG 24 hr capsule Take 4 mg by mouth daily.    Marland Kitchen HYDROcodone-acetaminophen (NORCO) 7.5-325 MG tablet Take 1 tablet by mouth every 6 (six) hours as needed for moderate pain. (Patient not taking: Reported on 04/30/2020) 20 tablet 0  . losartan (COZAAR) 50 MG tablet  (Patient not taking: Reported on 04/30/2020)     No facility-administered medications prior to visit.     Past Medical History:  Diagnosis Date  . Cancer ([place])    Throat cancer 2019  . Diabetes mellitus without complication (H[facility])    no meds  . ETOH  abuse   . Frequent urination   . Glaucoma   . Hepatitis C virus infection without hepatic coma    dx'ed in 11/2018  . History of radiation therapy 05/12/19- 07/06/19   Larynx  . Wears denture    upper only; lost lower denture     Past Surgical History:  Procedure Laterality Date  . ANKLE SURGERY  2011   right ankle  . COLONOSCOPY  02/2019   polyps - Dr Havery Moros  . DIRECT LARYNGOSCOPY N/A 04/01/2019   Procedure: DIRECT LARYNGOSCOPY WITH BIOPSY;  Surgeon: Jodi Marble, MD;  Location: [facility];  Service: ENT;  Laterality: N/A;  . DIRECT LARYNGOSCOPY N/A 01/06/2020   Procedure: DIRECT LARYNGOSCOPY;  Surgeon: Izora Gala, MD;  Location: [facility];  Service: ENT;  Laterality: N/A;  . ESOPHAGOSCOPY N/A 04/01/2019   Procedure: ESOPHAGOSCOPY;  Surgeon: Jodi Marble, MD;  Location: [facility];  Service: ENT;  Laterality: N/A;  . EYE SURGERY Right   . KNEE SURGERY    . LARYNGOSCOPY AND BRONCHOSCOPY N/A 04/01/2019   Procedure: BRONCHOSCOPY;  Surgeon: Jodi Marble, MD;  Location: [facility];  Service: ENT;  Laterality: N/A;  . RADICAL NECK DISSECTION N/A 01/06/2020   Procedure: RADICAL NECK DISSECTION;  Surgeon: Izora Gala, MD;  Location: [facility];  Service:  ENT;  Laterality: N/A;       Review of Systems  Constitutional: Negative for chills, diaphoresis, fatigue and fever.  Respiratory: Negative for cough, chest tightness, shortness of breath and wheezing.   Cardiovascular: Negative for chest pain.  Gastrointestinal: Negative for abdominal distention, abdominal pain, constipation, diarrhea, nausea and vomiting.  Neurological: Negative for weakness and headaches.  Hematological: Does not bruise/bleed easily.      Objective:    BP 134/86   Pulse 70   Temp 98.1 F (36.7 C) (Oral)   Ht 5\' 6"  (1.676 m)   Wt 137 lb (62.1 kg)   SpO2 98%   BMI 22.11 kg/m  Nursing note and vital signs reviewed.  Physical Exam Constitutional:      General: He is not in acute distress.    Appearance: He is  well-developed.  Cardiovascular:     Rate and Rhythm: Normal rate and regular rhythm.     Heart sounds: Normal heart sounds. No murmur heard.  No friction rub. No gallop.   Abdominal:     General: Bowel sounds are normal.     Palpations: Abdomen is soft. There is no mass.     Tenderness: There is no abdominal tenderness. There is no guarding or rebound.  Skin:    General: Skin is warm and dry.  Neurological:     Mental Status: He is alert and oriented to person, place, and time.  Psychiatric:        Mood and Affect: Mood normal.      Depression screen [facility] 2/9 04/30/2020 04/06/2019 04/06/2019 03/08/2019  Decreased Interest 0 0 0 0  Down, Depressed, Hopeless 0 0 0 0  PHQ - 2 Score 0 0 0 0       Assessment & Plan:    Patient Active Problem List   Diagnosis Date Noted  . Laryngeal cancer ([place]) 01/06/2020  . Metastatic cancer to cervical lymph nodes ([place]) 12/01/2019  . Chronic hepatitis C without hepatic coma ([place]) 06/09/2019  . 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 Squamous cell carcinoma of neck 04/01/2019     Problem List Items Addressed This Visit      Digestive   Chronic hepatitis C without hepatic coma ([place]) - Primary    Mr. Troiano is a 69 y/o male with Genotype 1a Chronic Hepatitis C with initial viral load of 8.2 million now s/p completion of 12 weeks of Epclusa. Treatment tolerated well with no adverse side effects. End of treatment RNA level was undetectable. Will check RNA level today. Discussed if undetectable then he is cured and should he need screening in the future to check the RNA level as antibody will always be positive. Fibrosis score of F4 but elastography showed minimal risk of fibrosis. Recommend routine hepatocellular carcinoma screening in the future. Plan for follow up with ID as needed pending blood work results.       Relevant Orders   Hepatitis C RNA quantitative       I am having [name] maintain his  latanoprost, cetirizine, tolterodine, atropine, ibuprofen, lidocaine, HYDROcodone-acetaminophen, and losartan.    Follow-up: Return Pending blood work if needed.Alan Piedra, MSN, FNP-C Nurse Practitioner [facility] [place] number: [phone]

## 2020-04-30 NOTE — Assessment & Plan Note (Signed)
Mr. Verge is a 69 y/o male with Genotype 1a Chronic Hepatitis C with initial viral load of 8.2 million now s/p completion of 12 weeks of Epclusa. Treatment tolerated well with no adverse side effects. End of treatment RNA level was undetectable. Will check RNA level today. Discussed if undetectable then he is cured and should he need screening in the future to check the RNA level as antibody will always be positive. Fibrosis score of F4 but elastography showed minimal risk of fibrosis. Recommend routine hepatocellular carcinoma screening in the future. Plan for follow up with ID as needed pending blood work results.

## 2020-05-02 LAB — HEPATITIS C RNA QUANTITATIVE
HCV RNA, PCR, QN (Log): <1.18 log IU/mL
HCV RNA, PCR, QN: <15 IU/mL

## 2020-06-01 ENCOUNTER — Telehealth: Payer: Self-pay

## 2020-06-01 NOTE — Telephone Encounter (Signed)
-----   Message from [place], [place] sent at 06/01/2020  8:57 AM EDT ----- Please inform [name] that his viral load is undetectable indicating cure of his Hepatitis C. No further treatment is necessary. Recommend continuing to follow up with PCP for routine liver cancer screening. No follow up with ID needed.

## 2020-06-01 NOTE — Telephone Encounter (Signed)
Patient made aware of results and was appreciative of phone call. [name]

## 2020-06-20 ENCOUNTER — Other Ambulatory Visit: Payer: Self-pay | Admitting: Physician Assistant

## 2020-06-20 DIAGNOSIS — Z8521 Personal history of malignant neoplasm of larynx: Secondary | ICD-10-CM

## 2020-07-05 ENCOUNTER — Ambulatory Visit
Admission: RE | Admit: 2020-07-05 | Discharge: 2020-07-05 | Disposition: A | Discharge status: Home / Self Care | Payer: Medicare HMO | Source: Ambulatory Visit | Attending: Physician Assistant | Admitting: Physician Assistant

## 2020-07-05 ENCOUNTER — Other Ambulatory Visit: Payer: Self-pay

## 2020-07-05 DIAGNOSIS — Z8521 Personal history of malignant neoplasm of larynx: Secondary | ICD-10-CM

## 2020-07-05 MED ORDER — IOPAMIDOL (ISOVUE-300) INJECTION 61%
75.0000 mL | Freq: Once | INTRAVENOUS | Status: AC | PRN
  Administered 2020-07-05: 75 mL via INTRAVENOUS

## 2020-07-31 ENCOUNTER — Ambulatory Visit: Payer: Medicare HMO | Admitting: Radiation Oncology

## 2020-08-01 ENCOUNTER — Encounter (HOSPITAL_BASED_OUTPATIENT_CLINIC_OR_DEPARTMENT_OTHER): Payer: Self-pay | Admitting: Otolaryngology

## 2020-08-01 ENCOUNTER — Other Ambulatory Visit: Payer: Self-pay

## 2020-08-07 NOTE — H&P (Signed)
HPI:   [name] is a 69 y.o. male who presents as a established patient.  [name] presents today with complaint of episodes of choking when he eats. He states it feels like "it goes down the wrong pipe". This gentleman is a patient of Dr. Janeice Robinson who has had previous laryngeal cancer and neck dissection and radiation therapy. He states that he has had this issue to some degree during the entire course but he thinks it may be getting a little worse. It happens with both solids and liquids. He does note that there are some textures are better than others when it comes to swallowing.  PMH/Meds/All/SocHx/FamHx/ROS:   Past Medical History:  Diagnosis Date  . Glaucoma   Past Surgical History:  Procedure Laterality Date  . COLONOSCOPY   No family history of bleeding disorders, wound healing problems or difficulty with anesthesia.   Social History   Socioeconomic History  . Marital status: Unknown  Spouse name: Not on file  . Number of children: Not on file  . Years of education: Not on file  . Highest education level: Not on file  Occupational History  . Not on file  Tobacco Use  . Smoking status: Current Every Day Smoker  . Smokeless tobacco: Never Used  Substance and Sexual Activity  . Alcohol use: Yes  . Drug use: Not on file  . Sexual activity: Not on file  Other Topics Concern  . Not on file  Social History Narrative  . Not on file   Social Determinants of Health   Financial Resource Strain:  . Difficulty of Paying Living Expenses: Not on file  Food Insecurity:  . Worried About Charity fundraiser in the Last Year: Not on file  . Ran Out of Food in the Last Year: Not on file  Transportation Needs:  . Lack of Transportation (Medical): Not on file  . Lack of Transportation (Non-Medical): Not on file  Physical Activity:  . Days of Exercise per Week: Not on file  . Minutes of Exercise per Session: Not on file  Stress:  . Feeling of Stress : Not on file  Social  Connections:  . Frequency of Communication with Friends and Family: Not on file  . Frequency of Social Gatherings with Friends and Family: Not on file  . Attends Religious Services: Not on file  . Active Member of Clubs or Organizations: Not on file  . Attends Archivist Meetings: Not on file  . Marital Status: Not on file   Current Outpatient Medications:  . aspirin 325 MG EC tablet *ANTIPLATELET*, Take 650 mg by mouth as needed., Disp: , Rfl:  . benzonatate (TESSALON) 200 MG capsule, Take 200 mg by mouth 3 (three) times daily as needed., Disp: , Rfl:  . cetirizine (ZYRTEC) 10 MG tablet, Take 10 mg by mouth daily., Disp: , Rfl:  . docusate sodium (COLACE) 100 MG capsule, Take 200 mg by mouth daily., Disp: , Rfl:  . EPCLUSA 400-100 mg tablet, , Disp: , Rfl:  . ergocalciferol (VITAMIN D2) 1,250 mcg (50,000 unit) capsule, TAKE 1 BY MOUTH ONCE A WEEK . PLEASE INFORM PATIENT TO PICK UP. THANKS, Disp: , Rfl:  . gabapentin (NEURONTIN) 300 MG capsule, Take 300 mg by mouth 3 times daily., Disp: , Rfl:  . HYDROcodone-acetaminophen (NORCO) 7.5-325 mg per tablet, , Disp: , Rfl:  . latanoprost (XALATAN) 0.005 % ophthalmic solution, Apply 1 drop to eye., Disp: , Rfl:  . LIDOCAINE 2 % solution, ,  Disp: , Rfl:  . losartan (COZAAR) 50 MG tablet, TAKE 1 TABLET BY MOUTH EVERY DAY, Disp: , Rfl:  . tolterodine (DETROL LA) 4 MG 24 hr capsule, TAKE 1 CAPSULE BY MOUTH EVERY DAY, Disp: , Rfl:  . traMADoL (ULTRAM) 50 mg tablet, TAKE 1 TABLET (50 MG TOTAL) BY MOUTH EVERY 6 (SIX) HOURS AS NEEDED., Disp: , Rfl:   A complete ROS was performed with pertinent positives/negatives noted in the HPI. The remainder of the ROS are negative.   Physical Exam:   BP 111/72  Pulse 84  Temp 98.2 F (36.8 C)  Ht 1.727 m (5\' 8" )  Wt 60.3 kg (133 lb)  BMI 20.22 kg/m   Constitutional:  Patient appears well-nourished and well-developed. No acute distress.  Head/Face: Facial features are symmetric. Skull is  normocephalic. Hair and scalp are normal. Normal temporal artery pulses. TMJ shows no joint deformity swelling or erythema.  Eyes: Pupils are equal, round and reactive to light. Conjunctiva and lids are normal. Normal extraocular mobility. Normal vision by patient report.  Ears:  Right: Pinna and external meatus normal, normal ear canal skin and caliber without excessive cerumen or drainage. Tympanic membranes intact without effusion or infection. Hearing normal. Left: Pinna and external meatus normal, normal ear canal skin and caliber without excessive cerumen or drainage. Tympanic membranes intact without effusion or infection. Hearing normal.  Nose/Sinus/Nasopharynx: Septum is normal. Normal nasal mucosa. Normal inferior turbinates. Normal middle and superior turbinates, sinus ostia patent without obstruction, mass or discharge. Nasopharynx patent.  Oral cavity/Oropharynx: Lips normal, edentulous, normal oral vestibule. Normal floor of mouth, tongue and oral mucosa, no mucosal lesions, ulcer or mass, normal tongue mobility.  Hard and soft palate normal with normal mobility. Right-sided tonsillar mass. No erythema or exudate. Base of tongue, retromolar trigone and oral pharynx normal. Normal sensation, mobility and gag.  Neck: No cervical lymphadenopathy, mass or swelling. Salivary glands normal to palpation without swelling, erythema or mass. Normal facial nerve function. Normal thyroid gland palpation.  Neurological: Alert and oriented to self, place and time. Normal reflexes and motor skills, balance and coordination.  Psychiatric: No unusual anxiety or evidence of depression. Appropriate affect.  Independent Review of Additional Tests or Records:  Previous office notes  Procedures:  Flexible Laryngoscopy Procedure Note  Indications: Dysphagia, history of laryngeal cancer  Risks, benefits and clinical relevance of the study were discussed. The patient understands and  agrees to proceed.   Procedure: After adequate topical anesthetic was applied, 4 mm flexible laryngoscope was passed through the nasal cavity without difficulty. Flexible laryngoscopy shows patent anterior nasal cavity with minimal crusting, no discharge or infection. Right-sided tonsillar mass with some verrucous appearance Normal base of tongue and supraglottis Normal vocal cord mobility without vocal cord nodule, mass, polyp or tumor. Hypopharynx normal without mass, pooling of secretions or aspiration. Postradiation changes. No laryngeal recurrence.  Patient tolerated procedure without complication or difficulty.

## 2020-08-10 ENCOUNTER — Other Ambulatory Visit (HOSPITAL_COMMUNITY)
Admission: RE | Admit: 2020-08-10 | Discharge: 2020-08-10 | Disposition: A | Discharge status: Home / Self Care | Payer: Medicare HMO | Source: Ambulatory Visit | Attending: Otolaryngology | Admitting: Otolaryngology

## 2020-08-10 DIAGNOSIS — Z01812 Encounter for preprocedural laboratory examination: Secondary | ICD-10-CM | POA: Insufficient documentation

## 2020-08-10 DIAGNOSIS — Z20822 Contact with and (suspected) exposure to covid-19: Secondary | ICD-10-CM | POA: Diagnosis not present

## 2020-08-10 LAB — SARS CORONAVIRUS 2 (TAT 6-24 HRS): SARS Coronavirus 2: NEGATIVE

## 2020-08-11 ENCOUNTER — Other Ambulatory Visit (HOSPITAL_COMMUNITY): Payer: Medicare HMO

## 2020-08-13 ENCOUNTER — Other Ambulatory Visit: Payer: Self-pay

## 2020-08-13 ENCOUNTER — Ambulatory Visit (HOSPITAL_BASED_OUTPATIENT_CLINIC_OR_DEPARTMENT_OTHER): Payer: Medicare HMO | Admitting: Certified Registered"

## 2020-08-13 ENCOUNTER — Ambulatory Visit (HOSPITAL_COMMUNITY)
Admission: RE | Admit: 2020-08-13 | Discharge: 2020-08-13 | Disposition: A | Discharge status: Home / Self Care | Payer: Medicare HMO | Attending: Otolaryngology | Admitting: Otolaryngology

## 2020-08-13 ENCOUNTER — Encounter (HOSPITAL_BASED_OUTPATIENT_CLINIC_OR_DEPARTMENT_OTHER): Payer: Self-pay | Admitting: Otolaryngology

## 2020-08-13 ENCOUNTER — Encounter (HOSPITAL_BASED_OUTPATIENT_CLINIC_OR_DEPARTMENT_OTHER)
Admission: RE | Disposition: A | Discharge status: Home / Self Care | Payer: Self-pay | Source: Home / Self Care | Attending: Otolaryngology

## 2020-08-13 DIAGNOSIS — R0989 Other specified symptoms and signs involving the circulatory and respiratory systems: Secondary | ICD-10-CM | POA: Diagnosis not present

## 2020-08-13 DIAGNOSIS — F17200 Nicotine dependence, unspecified, uncomplicated: Secondary | ICD-10-CM | POA: Insufficient documentation

## 2020-08-13 DIAGNOSIS — J359 Chronic disease of tonsils and adenoids, unspecified: Secondary | ICD-10-CM | POA: Insufficient documentation

## 2020-08-13 DIAGNOSIS — C099 Malignant neoplasm of tonsil, unspecified: Secondary | ICD-10-CM | POA: Insufficient documentation

## 2020-08-13 DIAGNOSIS — Z79899 Other long term (current) drug therapy: Secondary | ICD-10-CM | POA: Diagnosis not present

## 2020-08-13 DIAGNOSIS — R1310 Dysphagia, unspecified: Secondary | ICD-10-CM | POA: Diagnosis not present

## 2020-08-13 HISTORY — PX: DIRECT LARYNGOSCOPY: SHX5326

## 2020-08-13 HISTORY — DX: Essential (primary) hypertension: I10

## 2020-08-13 HISTORY — PX: EXCISION ORAL TUMOR: SHX6265

## 2020-08-13 SURGERY — LARYNGOSCOPY, DIRECT
Anesthesia: General | Site: Throat | Laterality: Right

## 2020-08-13 MED ORDER — EPINEPHRINE PF 1 MG/ML IJ SOLN
INTRAMUSCULAR | Status: AC
  Filled 2020-08-13: qty 3

## 2020-08-13 MED ORDER — LIDOCAINE 2% (20 MG/ML) 5 ML SYRINGE
INTRAMUSCULAR | Status: DC | PRN
  Administered 2020-08-13: 60 mg via INTRAVENOUS

## 2020-08-13 MED ORDER — SODIUM CHLORIDE (PF) 0.9 % IJ SOLN
INTRAMUSCULAR | Status: AC
  Filled 2020-08-13: qty 10

## 2020-08-13 MED ORDER — FENTANYL CITRATE (PF) 250 MCG/5ML IJ SOLN
INTRAMUSCULAR | Status: DC | PRN
  Administered 2020-08-13: 50 ug via INTRAVENOUS

## 2020-08-13 MED ORDER — ONDANSETRON HCL 4 MG/2ML IJ SOLN
INTRAMUSCULAR | Status: DC | PRN
  Administered 2020-08-13: 4 mg via INTRAVENOUS

## 2020-08-13 MED ORDER — PROMETHAZINE HCL 25 MG/ML IJ SOLN: 6.2500 mg | INTRAMUSCULAR | Status: DC | PRN

## 2020-08-13 MED ORDER — OXYCODONE HCL 5 MG/5ML PO SOLN: 5.0000 mg | Freq: Once | ORAL | Status: DC | PRN

## 2020-08-13 MED ORDER — AMISULPRIDE (ANTIEMETIC) 5 MG/2ML IV SOLN: 10.0000 mg | Freq: Once | INTRAVENOUS | Status: DC | PRN

## 2020-08-13 MED ORDER — BUPIVACAINE HCL (PF) 0.25 % IJ SOLN
INTRAMUSCULAR | Status: AC
  Filled 2020-08-13: qty 60

## 2020-08-13 MED ORDER — EPINEPHRINE PF 1 MG/ML IJ SOLN
INTRAMUSCULAR | Status: DC | PRN
  Administered 2020-08-13: 1 mg

## 2020-08-13 MED ORDER — MIDAZOLAM HCL 2 MG/2ML IJ SOLN
INTRAMUSCULAR | Status: AC
  Filled 2020-08-13: qty 2

## 2020-08-13 MED ORDER — BUPIVACAINE HCL (PF) 0.5 % IJ SOLN
INTRAMUSCULAR | Status: AC
  Filled 2020-08-13: qty 30

## 2020-08-13 MED ORDER — PROPOFOL 10 MG/ML IV BOLUS
INTRAVENOUS | Status: AC
  Filled 2020-08-13: qty 20

## 2020-08-13 MED ORDER — DEXAMETHASONE SODIUM PHOSPHATE 10 MG/ML IJ SOLN
INTRAMUSCULAR | Status: DC | PRN
  Administered 2020-08-13: 10 mg via INTRAVENOUS

## 2020-08-13 MED ORDER — LIDOCAINE HCL (PF) 1 % IJ SOLN
INTRAMUSCULAR | Status: AC
  Filled 2020-08-13: qty 60

## 2020-08-13 MED ORDER — LIDOCAINE-EPINEPHRINE 1 %-1:100000 IJ SOLN
INTRAMUSCULAR | Status: AC
  Filled 2020-08-13: qty 2

## 2020-08-13 MED ORDER — MEPERIDINE HCL 25 MG/ML IJ SOLN: 6.2500 mg | INTRAMUSCULAR | Status: DC | PRN

## 2020-08-13 MED ORDER — HYDROMORPHONE HCL 1 MG/ML IJ SOLN
INTRAMUSCULAR | Status: AC
  Filled 2020-08-13: qty 0.5

## 2020-08-13 MED ORDER — METHYLENE BLUE 0.5 % INJ SOLN
INTRAVENOUS | Status: AC
  Filled 2020-08-13: qty 10

## 2020-08-13 MED ORDER — LACTATED RINGERS IV SOLN: INTRAVENOUS | Status: DC

## 2020-08-13 MED ORDER — OXYCODONE HCL 5 MG PO TABS: 5.0000 mg | ORAL_TABLET | Freq: Once | ORAL | Status: DC | PRN

## 2020-08-13 MED ORDER — PROPOFOL 10 MG/ML IV BOLUS
INTRAVENOUS | Status: DC | PRN
  Administered 2020-08-13: 120 mg via INTRAVENOUS

## 2020-08-13 MED ORDER — HYDROMORPHONE HCL 1 MG/ML IJ SOLN
0.2500 mg | INTRAMUSCULAR | Status: DC | PRN
  Administered 2020-08-13 (×2): 0.5 mg via INTRAVENOUS

## 2020-08-13 MED ORDER — HYDROCODONE-ACETAMINOPHEN 7.5-325 MG/15ML PO SOLN
15.0000 mL | Freq: Four times a day (QID) | ORAL | 0 refills | Status: DC | PRN
Start: 2020-08-13 — End: 2020-08-31

## 2020-08-13 MED ORDER — ROCURONIUM BROMIDE 10 MG/ML (PF) SYRINGE
PREFILLED_SYRINGE | INTRAVENOUS | Status: DC | PRN
  Administered 2020-08-13: 50 mg via INTRAVENOUS

## 2020-08-13 MED ORDER — FENTANYL CITRATE (PF) 100 MCG/2ML IJ SOLN
INTRAMUSCULAR | Status: AC
  Filled 2020-08-13: qty 2

## 2020-08-13 MED ORDER — SUGAMMADEX SODIUM 200 MG/2ML IV SOLN
INTRAVENOUS | Status: DC | PRN
  Administered 2020-08-13: 200 mg via INTRAVENOUS

## 2020-08-13 MED ORDER — PHENYLEPHRINE 40 MCG/ML (10ML) SYRINGE FOR IV PUSH (FOR BLOOD PRESSURE SUPPORT)
PREFILLED_SYRINGE | INTRAVENOUS | Status: DC | PRN
  Administered 2020-08-13: 80 ug via INTRAVENOUS

## 2020-08-13 MED ORDER — ROCURONIUM BROMIDE 10 MG/ML (PF) SYRINGE
PREFILLED_SYRINGE | INTRAVENOUS | Status: AC
  Filled 2020-08-13: qty 10

## 2020-08-13 MED ORDER — LIDOCAINE 2% (20 MG/ML) 5 ML SYRINGE
INTRAMUSCULAR | Status: AC
  Filled 2020-08-13: qty 5

## 2020-08-13 SURGICAL SUPPLY — 55 items
BLADE SURG 15 STRL LF DISP TIS (BLADE) IMPLANT
BLADE SURG 15 STRL SS (BLADE)
BNDG EYE OVAL (GAUZE/BANDAGES/DRESSINGS) IMPLANT
BUR EGG 3PK/BX (BURR) IMPLANT
BUR FISSURE CARBIDE (BURR) IMPLANT
BUR ROUND CARBIDE (BURR) IMPLANT
BUR SIDE CUT (BURR) IMPLANT
CANISTER SUCT 1200ML W/VALVE (MISCELLANEOUS) ×4 IMPLANT
CATH ROBINSON RED A/P 12FR (CATHETERS) IMPLANT
CNTNR URN SCR LID CUP LEK RST (MISCELLANEOUS) ×2 IMPLANT
COAGULATOR SUCT 6 FR SWTCH (ELECTROSURGICAL)
COAGULATOR SUCT SWTCH 10FR 6 (ELECTROSURGICAL) IMPLANT
CONT SPEC 4OZ STRL OR WHT (MISCELLANEOUS) ×4
COVER MAYO STAND STRL (DRAPES) IMPLANT
COVER WAND RF STERILE (DRAPES) IMPLANT
ELECT COATED BLADE 2.86 ST (ELECTRODE) IMPLANT
ELECT REM PT RETURN 9FT ADLT (ELECTROSURGICAL)
ELECTRODE REM PT RTRN 9FT ADLT (ELECTROSURGICAL) IMPLANT
GAUZE SPONGE 4X4 12PLY STRL LF (GAUZE/BANDAGES/DRESSINGS) ×8 IMPLANT
GLOVE ECLIPSE 7.5 STRL STRAW (GLOVE) ×4 IMPLANT
GLOVE SURG UNDER POLY LF SZ7 (GLOVE) ×4 IMPLANT
GOWN STRL REUS W/ TWL LRG LVL3 (GOWN DISPOSABLE) IMPLANT
GOWN STRL REUS W/ TWL XL LVL3 (GOWN DISPOSABLE) IMPLANT
GOWN STRL REUS W/TWL LRG LVL3 (GOWN DISPOSABLE)
GOWN STRL REUS W/TWL XL LVL3 (GOWN DISPOSABLE)
GUARD TEETH (MISCELLANEOUS) IMPLANT
HEMOSTAT SNOW SURGICEL 2X4 (HEMOSTASIS) IMPLANT
HEMOSTAT SURGICEL .5X2 ABSORB (HEMOSTASIS) IMPLANT
HEMOSTAT SURGICEL 2X14 (HEMOSTASIS) IMPLANT
MARKER SKIN DUAL TIP RULER LAB (MISCELLANEOUS) IMPLANT
NEEDLE HYPO 18GX1.5 BLUNT FILL (NEEDLE) ×4 IMPLANT
NEEDLE PRECISIONGLIDE 27X1.5 (NEEDLE) IMPLANT
NEEDLE SPNL 22GX7 QUINCKE BK (NEEDLE) IMPLANT
NEEDLE SPNL 25GX3.5 QUINCKE BL (NEEDLE) IMPLANT
NS IRRIG 1000ML POUR BTL (IV SOLUTION) ×4 IMPLANT
PACK BASIN DAY SURGERY FS (CUSTOM PROCEDURE TRAY) IMPLANT
PATTIES SURGICAL .5 X3 (DISPOSABLE) ×4 IMPLANT
PENCIL FOOT CONTROL (ELECTRODE) IMPLANT
SHEET MEDIUM DRAPE 40X70 STRL (DRAPES) ×4 IMPLANT
SLEEVE SCD COMPRESS KNEE MED (MISCELLANEOUS) ×4 IMPLANT
SOLUTION BUTLER CLEAR DIP (MISCELLANEOUS) IMPLANT
SPONGE TONSIL TAPE 1 RFD (DISPOSABLE) IMPLANT
SPONGE TONSIL TAPE 1.25 RFD (DISPOSABLE) IMPLANT
SURGILUBE 2OZ TUBE FLIPTOP (MISCELLANEOUS) IMPLANT
SUT SILK 3 0 PS 1 (SUTURE) IMPLANT
SUT VIC AB 3-0 SH 27 (SUTURE)
SUT VIC AB 3-0 SH 27X BRD (SUTURE) IMPLANT
SYR 5ML LL (SYRINGE) IMPLANT
SYR BULB EAR ULCER 3OZ GRN STR (SYRINGE) IMPLANT
SYR CONTROL 10ML LL (SYRINGE) IMPLANT
SYR TB 1ML LL NO SAFETY (SYRINGE) ×4 IMPLANT
TOWEL GREEN STERILE FF (TOWEL DISPOSABLE) ×4 IMPLANT
TUBE CONNECTING 20'X1/4 (TUBING) ×1
TUBE CONNECTING 20X1/4 (TUBING) ×3 IMPLANT
TUBE SALEM SUMP 16 FR W/ARV (TUBING) IMPLANT

## 2020-08-13 NOTE — Interval H&P Note (Signed)
History and Physical Interval Note:  08/13/2020 7:20 AM  [name]  has presented today for surgery, with the diagnosis of Tonsillary Mass History of Laryngeal  Cancer.  The various methods of treatment have been discussed with the patient and family. After consideration of risks, benefits and other options for treatment, the patient has consented to  Procedure(s): DIRECT LARYNGOSCOPY (N/A) BIOPSY OF OROPHARYNGEAL MASS (N/A) as a surgical intervention.  The patient's history has been reviewed, patient examined, no change in status, stable for surgery.  I have reviewed the patient's chart and labs.  Questions were answered to the patient's satisfaction.     Izora Gala

## 2020-08-13 NOTE — Op Note (Signed)
OPERATIVE REPORT  DATE OF SURGERY: 08/13/2020  PATIENT:  [name],  69 y.o. male  PRE-OPERATIVE DIAGNOSIS:  Tonsillary Mass History of Laryngeal  Cancer  POST-OPERATIVE DIAGNOSIS:  Tonsillary Mass History of Laryngeal  Cancer  PROCEDURE:  Procedure(s): DIRECT LARYNGOSCOPY BIOPSY OF OROPHARYNGEAL MASS  SURGEON:  [name], MD  ASSISTANTS: none  ANESTHESIA:   General   EBL: 30 ml  DRAINS: none  LOCAL MEDICATIONS USED:  None  SPECIMEN: Biopsy right oropharyngeal mass  COUNTS:  Correct  PROCEDURE DETAILS: The patient was taken to the operating room and placed on the operating table in the supine position. Following induction of general endotracheal anesthesia, the table was turned 90 and the patient was draped in a standard fashion.  Patient is edentulous.  A Jako laryngoscope was used to evaluate the oropharynx hypopharynx and larynx.  The larynx itself was free of any mucosal lesions.  Post cricoid area and esophageal introitus were clear.  There is a large fungating mass arising from the right side oropharynx up to the level of the soft palate but not including the nasopharynx, not including the soft palate itself, and extending in the anterior posterior plane about where the tonsil would normally sit.  The inferior extent was into the vallecula and tongue base approaching the midline on both sides.  It abutted up against the posterior lateral floor of mouth gutter.  Multiple biopsies were taken from this mass and sent in formalin for pathologic evaluation.  Topical adrenaline was used on pledgets for hemostasis.  Patient was awakened extubated and transferred to recovery in stable condition.    PATIENT DISPOSITION:  To PACU, stable

## 2020-08-13 NOTE — Anesthesia Procedure Notes (Signed)
Procedure Name: Intubation Date/Time: 08/13/2020 7:41 AM Performed by: Myna Bright, CRNA Pre-anesthesia Checklist: Patient identified, Emergency Drugs available, Suction available and Patient being monitored Patient Re-evaluated:Patient Re-evaluated prior to induction Oxygen Delivery Method: Circle system utilized Preoxygenation: Pre-oxygenation with 100% oxygen Induction Type: IV induction Ventilation: Mask ventilation without difficulty Laryngoscope Size: Mac and 4 Grade View: Grade II Tube type: Oral Tube size: 7.0 mm Number of attempts: 1 Airway Equipment and Method: Stylet Placement Confirmation: ETT inserted through vocal cords under direct vision,  positive ETCO2 and breath sounds checked- equal and bilateral Secured at: 22 cm Tube secured with: Tape Dental Injury: Teeth and Oropharynx as per pre-operative assessment and Bloody posterior oropharynx  Comments: Smooth IV induction. Easy mask airway. DL x 1 with MAC 4. Good view of cords, although anatomy was somewhat distorted and immobile epiglottis. Friable tissue, small amount of bleeding noted in oropharynx. Otherwise atraumatic intubation. +EtCO2, +BBS.

## 2020-08-13 NOTE — Anesthesia Preprocedure Evaluation (Signed)
Anesthesia Evaluation  Patient identified by MRN, date of birth, ID band Patient awake    Reviewed: Allergy & Precautions, NPO status , Patient's Chart, lab work & pertinent test results  Airway Mallampati: III  TM Distance: >3 FB Neck ROM: Full    Dental  (+) Edentulous Upper, Edentulous Lower   Pulmonary former smoker,  Right supraglottic Laryngeal Ca with metastasis to cervical Lymph nodes   Pulmonary exam normal breath sounds clear to auscultation       Cardiovascular hypertension, Pt. on medications negative cardio ROS Normal cardiovascular exam Rhythm:Regular Rate:Normal     Neuro/Psych PSYCHIATRIC DISORDERS Glaucoma    GI/Hepatic (+)     substance abuse  alcohol use and cocaine use, Hepatitis -, CLast Cocaine use 2019   Endo/Other  diabetes, Well Controlled  Renal/GU   negative genitourinary   Musculoskeletal negative musculoskeletal ROS (+)   Abdominal   Peds  Hematology   Anesthesia Other Findings   Reproductive/Obstetrics                             Anesthesia Physical  Anesthesia Plan  ASA: III  Anesthesia Plan: General   Post-op Pain Management:    Induction: Intravenous  PONV Risk Score and Plan: 2 and Ondansetron, Midazolam and Treatment may vary due to age or medical condition  Airway Management Planned: Oral ETT  Additional Equipment:   Intra-op Plan:   Post-operative Plan: Extubation in OR  Informed Consent: I have reviewed the patients History and Physical, chart, labs and discussed the procedure including the risks, benefits and alternatives for the proposed anesthesia with the patient or authorized representative who has indicated his/her understanding and acceptance.     Dental advisory given  Plan Discussed with: CRNA and Surgeon  Anesthesia Plan Comments:         Anesthesia Quick Evaluation

## 2020-08-13 NOTE — Transfer of Care (Signed)
Immediate Anesthesia Transfer of Care Note  [name]  Procedure(s) Performed: DIRECT LARYNGOSCOPY (N/A Throat) BIOPSY OF OROPHARYNGEAL MASS (Right Throat)  Patient Location: PACU  Anesthesia Type:General  Level of Consciousness: awake, alert , oriented and patient cooperative  Airway & Oxygen Therapy: Patient Spontanous Breathing and Patient connected to face mask oxygen  Post-op Assessment: Report given to RN, Post -op Vital signs reviewed and stable and Patient moving all extremities  Post vital signs: Reviewed and stable  Last Vitals:  Vitals Value Taken Time  BP 181/117 08/13/20 0803  Temp    Pulse 70 08/13/20 0806  Resp 23 08/13/20 0806  SpO2 100 % 08/13/20 0806  Vitals shown include unvalidated device data.  Last Pain:  Vitals:   08/13/20 0648  TempSrc: Oral  PainSc: 6          Complications: No complications documented.

## 2020-08-13 NOTE — Anesthesia Postprocedure Evaluation (Signed)
Anesthesia Post Note  [name]  Procedure(s) Performed: DIRECT LARYNGOSCOPY (N/A Throat) BIOPSY OF OROPHARYNGEAL MASS (Right Throat)     Patient location during evaluation: PACU Anesthesia Type: General Level of consciousness: awake and alert Pain management: pain level controlled Vital Signs Assessment: post-procedure vital signs reviewed and stable Respiratory status: spontaneous breathing, nonlabored ventilation and respiratory function stable Cardiovascular status: blood pressure returned to baseline and stable Postop Assessment: no apparent nausea or vomiting Anesthetic complications: no   No complications documented.  Last Vitals:  Vitals:   08/13/20 0830 08/13/20 0900  BP: 131/89 (!) 140/92  Pulse:  98  Resp:  18  Temp:  37.5 C  SpO2:  98%    Last Pain:  Vitals:   08/13/20 0900  TempSrc:   PainSc: 0-No pain                 Lynda Rainwater

## 2020-08-13 NOTE — Discharge Instructions (Signed)

## 2020-08-14 ENCOUNTER — Encounter (HOSPITAL_BASED_OUTPATIENT_CLINIC_OR_DEPARTMENT_OTHER): Payer: Self-pay | Admitting: Otolaryngology

## 2020-08-21 ENCOUNTER — Other Ambulatory Visit: Payer: Self-pay

## 2020-08-21 DIAGNOSIS — C329 Malignant neoplasm of larynx, unspecified: Secondary | ICD-10-CM

## 2020-08-22 ENCOUNTER — Other Ambulatory Visit: Payer: Self-pay

## 2020-08-22 ENCOUNTER — Telehealth: Payer: Self-pay | Admitting: *Deleted

## 2020-08-22 DIAGNOSIS — C329 Malignant neoplasm of larynx, unspecified: Secondary | ICD-10-CM

## 2020-08-22 NOTE — Progress Notes (Signed)
[place]

## 2020-08-22 NOTE — Telephone Encounter (Signed)
Called patient to inform of Pet Scan on 08-30-20 - arrival time- 2 pm @ [facility], patient to be NPO- 6 hrs. prior to test, patient to avoid carbs and sugars, spoke with patient and he is aware of this test

## 2020-08-30 ENCOUNTER — Other Ambulatory Visit
Admission: RE | Admit: 2020-08-30 | Discharge: 2020-08-30 | Disposition: A | Discharge status: Home / Self Care | Payer: Medicare HMO | Source: Ambulatory Visit | Attending: Radiation Oncology | Admitting: Radiation Oncology

## 2020-08-30 ENCOUNTER — Other Ambulatory Visit: Payer: Self-pay

## 2020-08-30 ENCOUNTER — Encounter
Admission: RE | Admit: 2020-08-30 | Discharge: 2020-08-30 | Disposition: A | Discharge status: Home / Self Care | Payer: Medicare HMO | Source: Ambulatory Visit | Attending: Radiation Oncology | Admitting: Radiation Oncology

## 2020-08-30 DIAGNOSIS — C329 Malignant neoplasm of larynx, unspecified: Secondary | ICD-10-CM

## 2020-08-30 DIAGNOSIS — D490 Neoplasm of unspecified behavior of digestive system: Secondary | ICD-10-CM | POA: Diagnosis not present

## 2020-08-30 LAB — BUN: BUN: 9 mg/dL (ref 8–23)

## 2020-08-30 LAB — CREATININE, SERUM
Creatinine, Ser: 0.56 mg/dL — ABNORMAL LOW (ref 0.61–1.24)
GFR, Estimated: >60 mL/min (ref >60)

## 2020-08-30 LAB — GLUCOSE, CAPILLARY: Glucose-Capillary: 90 mg/dL (ref 70–99)

## 2020-08-30 MED ORDER — FLUDEOXYGLUCOSE F - 18 (FDG) INJECTION
6.7000 | Freq: Once | INTRAVENOUS | Status: AC | PRN
  Administered 2020-08-30: 14:00:00 7.32 via INTRAVENOUS

## 2020-08-30 NOTE — Progress Notes (Addendum)
Head and Neck Cancer Location of Tumor / Histology:  Squamous cell carcinoma of larynx  Patient presented ~2 months ago with symptoms of: (office note from Dr. Pietro [facility]) episodes of choking when he eats. He states it feels like "it goes down the wrong pipe". This gentleman is a patient of Dr. Janeice Robinson who has had previous laryngeal cancer and neck dissection and radiation therapy. He states that he has had this issue to some degree during the entire course but he thinks it may be getting a little worse. It happens with both solids and liquids.  CT neck with contrast 07/05/2020 IMPRESSION: 1. Sequelae of radiation and right neck dissection from the previous contrasted Neck CT. 2. Asymmetric soft tissue thickening and enhancement along the right lateral pharynx. Although this might be asymmetric mucositis, the appearance is suspicious and recommend direct inspection. NI-RADS category 2a. 3. Suspicious small 6 mm hyperenhancing nodular soft tissue along the posterior margin of the neck dissection at right level 3. NI-RADS category 2 vs 3 - although this is likely too small for imaging guided biopsy. Repeat PET-CT may be most valuable. 4. Mild inflammatory appearing right upper lobe centrilobular ground-glass opacity, new since February. Consider mild or developing right upper lobe infection. Post radiation changes to the lung apices.  Biopsies revealed:  08/13/2020 FINAL MICROSCOPIC DIAGNOSIS:  A. OROPHARNYX, BIOPSY:  - Poorly differentiated squamous cell carcinoma.   Nutrition Status Yes No Comments  Weight changes? [x]  []    Swallowing concerns? [x]  []  Struggles with drier or harder foods, and anything spicy. Also reports getting choked up easily with thin liquids.  States he often has to cough when swallowing, and this causes food or drink to come up through his nose  PEG? []  [x]     Referrals Yes No Comments  Social Work? [x]  []    Dentistry? [x]  []    Swallowing therapy? [x]   []    Nutrition? [x]  []    Med/Onc? [x]  []     Safety Issues Yes No Comments  Prior radiation? [x]  []  05/12/2019 through 07/06/2019 Site Technique Total Dose (Gy) Dose per Fx (Gy) Completed Fx Beam Energies  Head & neck: HN_larynx IMRT 70/70 2 35/35 6X    Pacemaker/ICD? []  [x]    Possible current pregnancy? []  [x]  N/A  Is the patient on methotrexate? []  [x]     Tobacco/Marijuana/Snuff/ETOH use:  Currently consumes alcohol. Former smoker. No drug use  Past/Anticipated interventions by otolaryngology, if any:  08/13/2020 Dr. Izora Gala DIRECT LARYNGOSCOPY BIOPSY OF OROPHARYNGEAL MASS  01/06/2020 Dr. Izora Gala DIRECT LARYNGOSCOPY BIOPSY OF OROPHARYNGEAL MASS  01/74/9449 Dr. Jodi Marble Direct laryngoscopy with biopsy.  Cervical esophagoscopy.  Bronchoscopy.  Past/Anticipated interventions by medical oncology, if any:  Scheduled for consult with Dr. Chryl Heck on 09/10/2020   Current Complaints / other details:   PET scan done 08/30/2020. Reports throat and right ear pain. Has receive both Pfizer vaccines, but not booster

## 2020-08-31 ENCOUNTER — Ambulatory Visit
Admission: RE | Admit: 2020-08-31 | Discharge: 2020-08-31 | Disposition: A | Discharge status: Home / Self Care | Payer: Medicare HMO | Source: Ambulatory Visit | Attending: Radiation Oncology | Admitting: Radiation Oncology

## 2020-08-31 ENCOUNTER — Other Ambulatory Visit: Payer: Self-pay

## 2020-08-31 ENCOUNTER — Encounter: Payer: Self-pay | Admitting: Radiation Oncology

## 2020-08-31 VITALS — BP 126/85 | HR 70 | Temp 96.9°F | Resp 18 | Ht 68.0 in | Wt 131.4 lb

## 2020-08-31 DIAGNOSIS — F1010 Alcohol abuse, uncomplicated: Secondary | ICD-10-CM | POA: Diagnosis not present

## 2020-08-31 DIAGNOSIS — Z923 Personal history of irradiation: Secondary | ICD-10-CM | POA: Insufficient documentation

## 2020-08-31 DIAGNOSIS — C328 Malignant neoplasm of overlapping sites of larynx: Secondary | ICD-10-CM | POA: Diagnosis present

## 2020-08-31 DIAGNOSIS — Z23 Encounter for immunization: Secondary | ICD-10-CM

## 2020-08-31 DIAGNOSIS — C090 Malignant neoplasm of tonsillar fossa: Secondary | ICD-10-CM

## 2020-08-31 DIAGNOSIS — C329 Malignant neoplasm of larynx, unspecified: Secondary | ICD-10-CM

## 2020-08-31 DIAGNOSIS — I10 Essential (primary) hypertension: Secondary | ICD-10-CM | POA: Insufficient documentation

## 2020-08-31 DIAGNOSIS — C770 Secondary and unspecified malignant neoplasm of lymph nodes of head, face and neck: Secondary | ICD-10-CM

## 2020-08-31 DIAGNOSIS — Z79899 Other long term (current) drug therapy: Secondary | ICD-10-CM | POA: Insufficient documentation

## 2020-08-31 DIAGNOSIS — C321 Malignant neoplasm of supraglottis: Secondary | ICD-10-CM

## 2020-08-31 DIAGNOSIS — R918 Other nonspecific abnormal finding of lung field: Secondary | ICD-10-CM | POA: Insufficient documentation

## 2020-08-31 MED ORDER — SODIUM CHLORIDE 0.9% FLUSH
10.0000 mL | Freq: Once | INTRAVENOUS | Status: AC
  Administered 2020-08-31: 09:00:00 10 mL via INTRAVENOUS

## 2020-08-31 MED ORDER — HYDROCODONE-ACETAMINOPHEN 7.5-325 MG/15ML PO SOLN: 15.0000 mL | Freq: Four times a day (QID) | ORAL | 0 refills | Status: DC | PRN

## 2020-08-31 MED ORDER — LIDOCAINE VISCOUS HCL 2 % MT SOLN: OROMUCOSAL | 3 refills | Status: DC

## 2020-08-31 NOTE — Progress Notes (Signed)
Radiation Oncology         [phone] ________________________________  Re-evaluation Note in person, outpatient  Name: [name] MRN: [id number]  Date: 08/31/2020  DOB: Dec 07, 1950  OV:FIEPPIR, Christean Grief, MD  Izora Gala, MD   REFERRING PHYSICIAN: Izora Gala, MD  DIAGNOSIS:    ICD-10-CM   1. Laryngeal cancer ([place])  C32.9   Multiply recurrent head and neck cancer now with a right oropharyngeal tumor  CHIEF COMPLAINT: Here to discuss management of throat cancer  HISTORY OF PRESENT ILLNESS::[name] is a 69 y.o. male who is well known by me and has been treated for laryngeal cancer in the past. He was last seen on 01/31/2020, prior to which time he had undergone a right neck dissection in April secondary to carcinoma of a right neck node. He was to continued SLP as recommended, follow-up with otolaryngology in two months, and with me in six months.   Since his last visit, he underwent a soft tissue neck CT scan on the date of 07/05/2020 that revealed asymmetric soft tissue thickening and enhancement along the right lateral pharynx. It also showed suspicious small 6 mm hyperenhancing nodular soft tissue along the posterior margin of the neck dissect at right level 3. Additionally, there was a mild inflammatory appearing right upper lobe centrilobular ground-glass opacity that was new (consider mild or developing upper lobe infection), post radiation changes to the lung apices, and sequelae of radiation and right neck dissection from previous neck CT.  Subsequently, the patient underwent a direct laryngoscopy and biopsy of oropharyngeal mass on the date of 08/13/2020. Pathology from the procedure revealed poorly differentiated squamous cell carcinoma.  P16 was ordered but I do not see the results.  Our navigator is looking into this.  PET scan was performed yesterday. Results are pending.  To my eye, I see a large right oropharyngeal tumor and hypermetabolic activity in the  suspicious right level 3 node.  Swallowing issues, if any: He struggles with dry or hard foods and anything spicy.  He also has issues with thin liquids.  He often has to cough when swallowing.  Weight Changes: Wt Readings from Last 3 Encounters:  08/31/20 131 lb 6 oz (59.6 kg)  08/13/20 129 lb 10.1 oz (58.8 kg)  04/30/20 137 lb (62.1 kg)    Tobacco history, if any: Denies  ETOH abuse, if any: Still consumes alcohol  Prior cancers, if any: This is his third course of active head and neck cancer   Previous radiation Treatment Dates: 05/12/2019 through 07/06/2019, the larynx and bilateral neck were treated Site Technique Total Dose (Gy) Dose per Fx (Gy) Completed Fx Beam Energies  Head & neck: HN_larynx IMRT 70/70 2 35/35 6X    PAST MEDICAL HISTORY:  has a past medical history of Cancer ([place]), ETOH abuse, Frequent urination, Glaucoma, Hepatitis C virus infection without hepatic coma, History of radiation therapy (05/12/19- 07/06/19), Hypertension, and Wears denture.    PAST SURGICAL HISTORY: Past Surgical History:  Procedure Laterality Date  . ANKLE SURGERY  2011   right ankle  . COLONOSCOPY  02/2019   polyps - Dr Havery Moros  . DIRECT LARYNGOSCOPY N/A 04/01/2019   Procedure: DIRECT LARYNGOSCOPY WITH BIOPSY;  Surgeon: Jodi Marble, MD;  Location: [place];  Service: ENT;  Laterality: N/A;  . DIRECT LARYNGOSCOPY N/A 01/06/2020   Procedure: DIRECT LARYNGOSCOPY;  Surgeon: Izora Gala, MD;  Location: [place];  Service: ENT;  Laterality: N/A;  . DIRECT LARYNGOSCOPY N/A 08/13/2020   Procedure:  DIRECT LARYNGOSCOPY;  Surgeon: Izora Gala, MD;  Location: [place];  Service: ENT;  Laterality: N/A;  . ESOPHAGOSCOPY N/A 04/01/2019   Procedure: ESOPHAGOSCOPY;  Surgeon: Jodi Marble, MD;  Location: [place];  Service: ENT;  Laterality: N/A;  . EXCISION ORAL TUMOR Right 08/13/2020   Procedure: BIOPSY OF OROPHARYNGEAL MASS;  Surgeon: Izora Gala, MD;  Location: [place];  Service: ENT;  Laterality: Right;  . EYE SURGERY Right   . KNEE SURGERY    . LARYNGOSCOPY AND BRONCHOSCOPY N/A 04/01/2019   Procedure: BRONCHOSCOPY;  Surgeon: Jodi Marble, MD;  Location: [place];  Service: ENT;  Laterality: N/A;  . RADICAL NECK DISSECTION N/A 01/06/2020   Procedure: RADICAL NECK DISSECTION;  Surgeon: Izora Gala, MD;  Location: [place];  Service: ENT;  Laterality: N/A;    FAMILY HISTORY: family history includes Breast cancer in his sister; Cancer in his brother; Colon cancer (age of onset: 81) in his brother.  SOCIAL HISTORY:  reports that he quit smoking about 2 months ago. His smoking use included cigarettes. He quit after 50.00 years of use. He has never used smokeless tobacco. He reports current alcohol use of about 4.0 standard drinks of alcohol per week. He reports current drug use. Drug: Cocaine.  ALLERGIES: Patient has no known allergies.  MEDICATIONS:  Current Outpatient Medications  Medication Sig Dispense Refill  . prednisoLONE acetate (PRED FORTE) 1 % ophthalmic suspension Place 1 drop into both eyes daily.    Marland Kitchen atropine 1 % ophthalmic solution Place 1 drop into the right eye 2 (two) times a day.     . cetirizine (ZYRTEC) 10 MG tablet Take 10 mg by mouth daily as needed for allergies.    Marland Kitchen HYDROcodone-acetaminophen (HYCET) 7.5-325 mg/15 ml solution Take 15 mLs by mouth 4 (four) times daily as needed for moderate pain. 480 mL 0  . HYDROcodone-acetaminophen (NORCO) 7.5-325 MG tablet Take 1 tablet by mouth every 6 (six) hours as needed for moderate pain. 20 tablet 0  . ibuprofen (ADVIL) 200 MG tablet Take 400 mg by mouth every 6 (six) hours as needed for headache or moderate pain.    Marland Kitchen latanoprost (XALATAN) 0.005 % ophthalmic solution Place 1 drop into the right eye at bedtime.     . lidocaine (XYLOCAINE) 2 % solution Patient: Mix 1part 2% viscous lidocaine, 1part H20. Swallow 56mL of diluted mixture, 45min before meals and at bedtime, up to QID 150 mL 3  .  losartan (COZAAR) 50 MG tablet     . tolterodine (DETROL LA) 4 MG 24 hr capsule Take 4 mg by mouth daily.    . Vitamin D, Ergocalciferol, (DRISDOL) 1.25 MG (50000 UNIT) CAPS capsule Take 50,000 Units by mouth once a week.     No current facility-administered medications for this encounter.    REVIEW OF SYSTEMS:  Notable for that above.   PHYSICAL EXAM:  height is 5\' 8"  (1.727 m) and weight is 131 lb 6 oz (59.6 kg). His temporal temperature is 96.9 F (36.1 C) (abnormal). His blood pressure is 126/85 and his pulse is 70. His respiration is 18 and oxygen saturation is 100%.   General: Alert and oriented, in no acute distress, cachectic; family member provides most of the history HEENT: Head is normocephalic. Extraocular movements are intact. Oropharynx is notable for a submucosal right tonsillar mass involving the palate, at least 3 cm on exam.  Brisk gag reflex Neck: Neck is notable for no palpable adenopathy Heart: Regular  in rate and rhythm with no murmurs, rubs, or gallops. Chest: Decreased breath sounds bilaterally  Abdomen: Soft, nontender, nondistended, with no rigidity or guarding. Extremities: No cyanosis or edema. Lymphatics: see Neck Exam Skin: No concerning lesions.   ECOG = 2  0 - Asymptomatic (Fully active, able to carry on all predisease activities without restriction)  1 - Symptomatic but completely ambulatory (Restricted in physically strenuous activity but ambulatory and able to carry out work of a light or sedentary nature. For example, light housework, office work)  2 - Symptomatic, <50% in bed during the day (Ambulatory and capable of all self care but unable to carry out any work activities. Up and about more than 50% of waking hours)  3 - Symptomatic, >50% in bed, but not bedbound (Capable of only limited self-care, confined to bed or chair 50% or more of waking hours)  4 - Bedbound (Completely disabled. Cannot carry on any self-care. Totally confined to bed or  chair)  5 - Death   Eustace Pen MM, Creech RH, Tormey DC, et al. [phone]). "Toxicity and response criteria of the [facility] Group". [place] Oncol. 5 (6): 649-55   LABORATORY DATA:  Lab Results  Component Value Date   WBC 3.6 (L) 01/03/2020   HGB 12.8 (L) 01/03/2020   HCT 39.1 01/03/2020   MCV 94.4 01/03/2020   PLT 193 01/03/2020   CMP     Component Value Date/Time   NA 142 01/03/2020 1355   K 3.4 (L) 01/03/2020 1355   CL 105 01/03/2020 1355   CO2 29 01/03/2020 1355   GLUCOSE 93 01/03/2020 1355   BUN 9 08/30/2020 1351   CREATININE 0.56 (L) 08/30/2020 1351   CREATININE 0.61 (L) 10/26/2019 1523   CALCIUM 9.0 01/03/2020 1355   PROT 7.2 01/03/2020 1355   ALBUMIN 3.6 01/03/2020 1355   AST 17 01/03/2020 1355   AST 60 (H) 05/04/2019 1207   ALT 14 01/03/2020 1355   ALT 53 (H) 09/06/2019 1543   ALKPHOS 41 01/03/2020 1355   BILITOT 0.5 01/03/2020 1355   BILITOT 0.5 05/04/2019 1207   GFRNONAA >60 08/30/2020 1351   GFRNONAA >60 05/04/2019 1207   GFRAA >60 01/03/2020 1355   GFRAA >60 05/04/2019 1207      Lab Results  Component Value Date   TSH 0.905 01/31/2020     RADIOGRAPHY:    As above    IMPRESSION/PLAN:  This is a delightful patient with multiply recurrent head and neck cancer.   Next week he has a consultation with ENT at [facility] to consider surgical salvage.  The inferior aspect of his recurrent tumor is in the  previously irradiated field, as is the recurrent lymph node and right level 3 neck.  If he is not a good candidate for surgical salvage (Dr. Constance Holster has concerns that surgery would be quite extensive and morbid) and I think it is reasonable for him to get targeted re-irradiation therapy to the involved sites only, over 6 weeks.  He may be a candidate for concurrent chemotherapy as well .  We discussed the potential risks, benefits, and side effects of radiotherapy and we discussed that the radiation therapy would have heightened risks because  of his previous radiation history in the head and neck. We talked in detail about acute and late effects. We discussed that some of the most bothersome acute effects may be mucositis, dysgeusia, salivary changes, skin irritation,  and fatigue. We talked about late effects which include but  are not necessarily limited to dysphagia, hypothyroidism, nerve injury, vascular injury, spinal cord injury, xerostomia, trismus, neck edema, and potential injury to any of the tissues in the head and neck region.  Treatment could potentially be fatal and could result in a severe vascular event such as bleeding in the head and neck region. No guarantees of treatment were given. A consent form was signed and placed in the patient's medical record. The patient is enthusiastic about proceeding with treatment. I look forward to participating in the patient's care if this is ultimately felt to be his best option..    Simulation (treatment planning) will take place today but we will hold his treatment plan until early January so that he has time to meet with medical oncology here and otolaryngology at [facility] to iron out his options  We also discussed that the treatment of head and neck cancer is a multidisciplinary process to maximize treatment outcomes and quality of life. For this reason the following referrals have been or will be made:   Medical oncology to discuss chemotherapy    Nutritionist for nutrition support during and after treatment.   Speech language pathology for swallowing and/or speech therapy. MBSS due to report of dysphagia.   Social work for social support.    Physical therapy due to risk of lymphedema in neck and deconditioning.  Discussed importance of cutting back and ultimately stopping alcohol consumption.   Alcoholics Anonymous discussed as a Data processing manager.   We discussed measures to reduce the risk of infection during the COVID-19 pandemic.  He is due for his booster shot.  He is  interested in getting this but does not have his Covid vaccination cards with him.  We will arrange for him to receive that on December 27 at his next visit at the cancer center with medical oncology.   On date of service, in total, I spent 65 minutes on this encounter. Patient was seen in person.  __________________________________________   [name], MD  This document serves as a record of services personally performed by [name], MD. It was created on his behalf by Clerance Lav, a trained medical scribe. The creation of this record is based on the scribe's personal observations and the provider's statements to them. This document has been checked and approved by the attending provider.

## 2020-08-31 NOTE — Progress Notes (Signed)
Has armband been applied?  Yes.    Does patient have an allergy to IV contrast dye?: No.   Has patient ever received premedication for IV contrast dye?: No.   Does patient take metformin?: No.  Date of lab work: August 30, 2020 BUN: 9 CR: 0.56  IV site: antecubital left, condition patent and no redness  Has IV site been added to flowsheet?  Yes.    BP: 126/85 HR: 70 O2: 100% R: 18 T: 96.9 (temporal) WT: 131.6 lb

## 2020-09-03 NOTE — Progress Notes (Signed)
Oncology Nurse Navigator Documentation  At Dr. Pearlie Oyster request I called [facility] Pathology and added a request for p16 testing to be completed on Mr. Hendershott' 08/13/20 Pathology.   I will inform Dr. Isidore Moos of the results when known.  Harlow Asa RN, BSN, OCN Head & Neck Oncology Nurse [place] at [facility] Phone # [phone]  Fax # [phone]

## 2020-09-04 ENCOUNTER — Telehealth: Payer: Self-pay

## 2020-09-04 NOTE — Telephone Encounter (Signed)
Nutrition  Verdene Lennert, caregiver for patient called requesting another case of ensure for patient. Complimentary case of ensure left at registration for patient to pick up. This has been communicated to [place].    Milissa Fesperman B. Zenia Resides, [place], [place] Registered Dietitian [phone] (mobile)

## 2020-09-04 NOTE — Progress Notes (Signed)
Oncology Nurse Navigator Documentation  At Dr. Pearlie Oyster request I have called [name] (imaging specialilst at [facility]). I have asked for [name]' 07/05/20 CT soft tissue neck w. Contrast be pushed to [employer] for evaluation at [facility] by Dr. Conley Canal. She verbalized that she would do that for me.   Harlow Asa RN, BSN, OCN Head & Neck Oncology Nurse [place] at [facility] Phone # [phone]  Fax # [phone]

## 2020-09-10 ENCOUNTER — Inpatient Hospital Stay: Payer: Medicare HMO | Attending: Hematology and Oncology | Admitting: Hematology and Oncology

## 2020-09-10 ENCOUNTER — Telehealth: Payer: Self-pay | Admitting: Hematology and Oncology

## 2020-09-10 ENCOUNTER — Other Ambulatory Visit: Payer: Self-pay

## 2020-09-10 ENCOUNTER — Inpatient Hospital Stay: Payer: Medicare HMO

## 2020-09-10 ENCOUNTER — Encounter: Payer: Self-pay | Admitting: Hematology and Oncology

## 2020-09-10 VITALS — BP 143/89 | HR 76 | Temp 97.2°F | Resp 18 | Ht 68.0 in | Wt 131.5 lb

## 2020-09-10 DIAGNOSIS — R1310 Dysphagia, unspecified: Secondary | ICD-10-CM | POA: Diagnosis not present

## 2020-09-10 DIAGNOSIS — Z23 Encounter for immunization: Secondary | ICD-10-CM

## 2020-09-10 DIAGNOSIS — C329 Malignant neoplasm of larynx, unspecified: Secondary | ICD-10-CM | POA: Insufficient documentation

## 2020-09-10 DIAGNOSIS — Z923 Personal history of irradiation: Secondary | ICD-10-CM | POA: Diagnosis not present

## 2020-09-10 DIAGNOSIS — R918 Other nonspecific abnormal finding of lung field: Secondary | ICD-10-CM | POA: Insufficient documentation

## 2020-09-10 DIAGNOSIS — D490 Neoplasm of unspecified behavior of digestive system: Secondary | ICD-10-CM | POA: Insufficient documentation

## 2020-09-10 DIAGNOSIS — Z79899 Other long term (current) drug therapy: Secondary | ICD-10-CM | POA: Diagnosis not present

## 2020-09-10 MED ORDER — INFLUENZA VAC A&B SA ADJ QUAD 0.5 ML IM PRSY
0.5000 mL | PREFILLED_SYRINGE | Freq: Once | INTRAMUSCULAR | Status: AC
  Administered 2020-09-10: 15:00:00 0.5 mL via INTRAMUSCULAR

## 2020-09-10 MED ORDER — INFLUENZA VAC A&B SA ADJ QUAD 0.5 ML IM PRSY
PREFILLED_SYRINGE | INTRAMUSCULAR | Status: AC
  Filled 2020-09-10: qty 0.5

## 2020-09-10 NOTE — Telephone Encounter (Signed)
Scheduled appointment per 12/27 los. Spoke to patient who is aware of appointment date and time. Gave patient calendar print out.

## 2020-09-10 NOTE — Progress Notes (Signed)
[name] NOTE  Patient Care Team: [name], MD as PCP - General (Internal Medicine) Izora Gala, MD as Consulting Physician (Otolaryngology) [name], MD as Attending Physician (Radiation Oncology) Leota Sauers, RN (Inactive) as Oncology Nurse Navigator Karie Mainland, RD as Dietitian (Nutrition) Valentino Saxon Perry Mount, CCC-SLP as Speech Language Pathologist (Speech Pathology) Kennith Center, LCSW as Social Worker  CHIEF COMPLAINTS/PURPOSE OF CONSULTATION:  Recurrent SCC of larynx  ASSESSMENT & PLAN:   No problem-specific Assessment & Plan notes found for this encounter.  No orders of the defined types were placed in this encounter.  This is a very pleasant 69 year old male patient with history of recurrent laryngeal cancer now with a right oropharyngeal mass referred to medical oncology for further recommendations.  Please refer to the below mentioned history.   He was first diagnosed with T2N0 laryngeal cancer back in August 2020 and underwent definitive radiotherapy since he refused surgical treatment.  He then had a recurrence in Oct 2021 and had modified radical neck dissection, now presented with soft tissue thickening and enhancement along the right lateral pharynx. PET showed recurrence of head and neck cancer with broad hypermetabolic pharyngeal mucosal lesion involving right lateral posterior oropharynx and hypopharynx extending from palatine tonsil to the vallecula.Hypermetabolic lymph node posterior sternocleidomastoid muscle on the RIGHT (level 3).Hypermetabolic nodule within the LEFT parotid glands favored small primary parotid neoplasm. He saw Dr. Conley Canal from [facility], awaiting recommendations for surgical management. I discussed about considering surgery upfront if this is a possibility.  If surgery is not an option, we are happy to consider chemoradiation with weekly cisplatin.  I discussed about side effects from cisplatin  including but not limited to fatigue, nausea, vomiting, increased risk of infections, ototoxicity, nephrotoxicity and clearly explained that some of the side effects can be permanent.  I have encouraged considering a PEG tube and a Port-A-Cath for chemotherapy administration. He will return to clinic to discuss the final recommendations with me on Tuesday.  Hopefully we will know if surgery is a possibility by that time so we can prepare accordingly. He understands that there is no guarantee for cure despite what ever modality of treatment we choose. All his questions were answered to the best of my knowledge.  Thank you for consulting [place] in the care of this patient.  HISTORY OF PRESENTING ILLNESS:   [name] 69 y.o. male is here because of laryngeal cancer  [name] is a 69 y.o. male who has been treated for laryngeal cancer in the past.   Aug 2020, Pt had presented with dysphagia and difficulty breathing through his mouth, 20 lb weight loss. He saw Dr. Erik Obey who performed laryngoscopy and appreciated a 2 to 3 cm ovoid pedunculated mass arising from the right aryepiglottic fold and impinging on the supraglottic airway.  The vocal cords appeared to be mobile.  Urgent panendoscopy and biopsy were recommended to protect his airway.  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 According to his op note,a bulky necrotic and semi-pedunculated tumor coming off the lateral surface of the right aryepiglottic fold and the anterior and lateral aspect of the piriform sinus on the right side. Airway was compromised by the tumor mass at intubation.  CT scan of the neck on 04/11/2019 showed glottic closure with no asymmetry of the cords, correlate with laryngoscopy results.  Questionable right level 2 lymph node measured 7 mm.  No definite  pathologic lymph nodes in the neck.  PET scan on 04/22/2019 revealed mild  residual activity in the posterior right hypopharynx confined to the mucosa with no evidence of metastatic disease to the neck or distantly.  He underwent definitive radiotherapy to T2N0 laryngeal cancer  since he refused surgical treatment.  He then had PET imaging which showed a right neck lymph node, suspicious on PET. Biopsy of right neck lymph node is positive for recurrence. He underwent right modified radical neck dissection including levels 2 and 3, including the internal jugular vein, suture marks the inferior jugular vein stump.  He most recently underwent a soft tissue neck CT scan on the date of 07/05/2020 that revealed asymmetric soft tissue thickening and enhancement along the right lateral pharynx. It also showed suspicious small 6 mm hyperenhancing nodular soft tissue along the posterior margin of the neck dissect at right level 3. Additionally, there was a mild inflammatory appearing right upper lobe centrilobular ground-glass opacity that was new (consider mild or developing upper lobe infection), post radiation changes to the lung apices, and sequelae of radiation and right neck dissection from previous neck CT.  Subsequently, the patient underwent a direct laryngoscopy and biopsy of oropharyngeal mass on the date of 08/13/2020. Pathology from the procedure revealed poorly differentiated squamous cell carcinoma.  PET showed recurrence of head and neck cancer with broad hypermetabolic pharyngeal mucosal lesion involving right lateral posterior oropharynx and hypopharynx extending from palatine tonsil to the vallecula.Hypermetabolic lymph node posterior sternocleidomastoid muscle on the RIGHT (level 3).Hypermetabolic nodule within the LEFT parotid glands favored small primary parotid neoplasm.  He saw Dr Leonia Reeves at [facility] for surgical consultation, awaiting final surgery recommendations. He is here with his friend today for the appointment. He says it hurts to swallow and there  is pain around the right ear. He can mostly swallow everything though although sometimes he has to drink more water to push food down. No baseline hearing loss. No other complaints for me. He smoked about 1 ppweek, used to drink 40 oz beer daily, occasionally hard liquor. He is a Curator by occupation.  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 Ears, nose, mouth, throat, and face: as mentioned.Respiratory: Denies cough, dyspnea or wheezes Cardiovascular: Denies palpitation, chest discomfort or lower extremity swelling Gastrointestinal:  Denies nausea, heartburn or change in bowel habits Skin: Denies abnormal skin rashes Lymphatics: Denies new lymphadenopathy or easy bruising Neurological:Denies numbness, tingling or new weaknesses Behavioral/Psych: Mood is stable, no new changes  All other systems were reviewed with the patient and are negative.  MEDICAL HISTORY:  Past Medical History:  Diagnosis Date  . Cancer ([place])    Throat cancer 2019  . ETOH abuse   . Frequent urination   . Glaucoma   . Hepatitis C virus infection without hepatic coma    dx'ed in 11/2018  . History of radiation therapy 05/12/19- 07/06/19   Larynx  . Hypertension   . Wears denture    upper only; lost lower denture    SURGICAL HISTORY: Past Surgical History:  Procedure Laterality Date  . ANKLE SURGERY  2011   right ankle  . COLONOSCOPY  02/2019   polyps - Dr Havery Moros  . DIRECT LARYNGOSCOPY N/A 04/01/2019   Procedure: DIRECT LARYNGOSCOPY WITH BIOPSY;  Surgeon: Jodi Marble, MD;  Location: [facility];  Service: ENT;  Laterality: N/A;  . DIRECT LARYNGOSCOPY N/A 01/06/2020   Procedure: DIRECT LARYNGOSCOPY;  Surgeon: Constance Holster,  Enrigue Catena, MD;  Location: [facility] OR;  Service: ENT;  Laterality: N/A;  . DIRECT LARYNGOSCOPY N/A 08/13/2020   Procedure: DIRECT LARYNGOSCOPY;  Surgeon: Serena Colonel, MD;  Location: [facility];  Service: ENT;   Laterality: N/A;  . ESOPHAGOSCOPY N/A 04/01/2019   Procedure: ESOPHAGOSCOPY;  Surgeon: Flo Shanks, MD;  Location: [facility] OR;  Service: ENT;  Laterality: N/A;  . EXCISION ORAL TUMOR Right 08/13/2020   Procedure: BIOPSY OF OROPHARYNGEAL MASS;  Surgeon: Serena Colonel, MD;  Location: [facility];  Service: ENT;  Laterality: Right;  . EYE SURGERY Right   . KNEE SURGERY    . LARYNGOSCOPY AND BRONCHOSCOPY N/A 04/01/2019   Procedure: BRONCHOSCOPY;  Surgeon: Flo Shanks, MD;  Location: [facility] OR;  Service: ENT;  Laterality: N/A;  . RADICAL NECK DISSECTION N/A 01/06/2020   Procedure: RADICAL NECK DISSECTION;  Surgeon: Serena Colonel, MD;  Location: [facility] OR;  Service: ENT;  Laterality: N/A;    SOCIAL HISTORY: Social History   Socioeconomic History  . Marital status: Single    Spouse name: Not on file  . Number of children: 2  . Years of education: Not on file  . Highest education level: Not on file  Occupational History  . Not on file  Tobacco Use  . Smoking status: Former Smoker    Years: 50.00    Types: Cigarettes    Quit date: 06/01/2020    Years since quitting: 0.2  . Smokeless tobacco: Never Used  Vaping Use  . Vaping Use: Never used  Substance and Sexual Activity  . Alcohol use: Yes    Alcohol/week: 4.0 standard drinks    Types: 4 Cans of beer per week    Comment: 40oz beer daily  . Drug use: Yes    Types: Cocaine    Comment: none in 2 yrs  . Sexual activity: Yes    Partners: Female  Other Topics Concern  . Not on file  Social History Narrative   Patient is divorced with 2 children.   Patient is currently living with his sister.   Patient with a history of smoking a third of pack of cigarettes daily for 50 years.  Patient currently smoking 2 to 3 cigarettes/day.   Patient has never used smokeless tobacco.   Patient with occasional use of alcohol.   Patient last used cocaine approximately 6 months ago.  Patient denies use of marijuana.   Social Determinants of  Health   Financial Resource Strain: Not on file  Food Insecurity: Not on file  Transportation Needs: Not on file  Physical Activity: Not on file  Stress: Not on file  Social Connections: Not on file  Intimate Partner Violence: Not on file    FAMILY HISTORY: Family History  Problem Relation Age of Onset  . Breast cancer Sister   . Colon cancer Brother 75       ????  . Cancer Brother     ALLERGIES:  has No Known Allergies.  MEDICATIONS:  Current Outpatient Medications  Medication Sig Dispense Refill  . atropine 1 % ophthalmic solution Place 1 drop into the right eye 2 (two) times a day.     . cetirizine (ZYRTEC) 10 MG tablet Take 10 mg by mouth daily as needed for allergies.    Marland Kitchen HYDROcodone-acetaminophen (HYCET) 7.5-325 mg/15 ml solution Take 15 mLs by mouth 4 (four) times daily as needed for moderate pain. 473 each 0  . ibuprofen (ADVIL) 200 MG tablet Take 400 mg by mouth every  6 (six) hours as needed for headache or moderate pain.    Marland Kitchen latanoprost (XALATAN) 0.005 % ophthalmic solution Place 1 drop into the right eye at bedtime.     . lidocaine (XYLOCAINE) 2 % solution Patient: Mix 1part 2% viscous lidocaine, 1part H20. Swish AND swallow 58mL of diluted mixture, 20min before meals and at bedtime, up to QID 200 mL 3  . losartan (COZAAR) 50 MG tablet Take 50 mg by mouth daily.    . prednisoLONE acetate (PRED FORTE) 1 % ophthalmic suspension Place 1 drop into both eyes daily.    Marland Kitchen tolterodine (DETROL LA) 4 MG 24 hr capsule Take 4 mg by mouth daily.    . Vitamin D, Ergocalciferol, (DRISDOL) 1.25 MG (50000 UNIT) CAPS capsule Take 50,000 Units by mouth once a week.     No current facility-administered medications for this visit.     PHYSICAL EXAMINATION: ECOG PERFORMANCE STATUS: 1 - Symptomatic but completely ambulatory  Vitals:   09/10/20 1336  BP: (!) 143/89  Pulse: 76  Resp: 18  Temp: (!) 97.2 F (36.2 C)  SpO2: 100%   Filed Weights   09/10/20 1336  Weight: 131 lb 8  oz (59.6 kg)    GENERAL:alert, no distress and comfortable SKIN: skin color, texture, turgor are normal, no rashes or significant lesions EYES: normal, conjunctiva are pink and non-injected, sclera clear OROPHARYNX there is a right tonsillar mass, brisk gag NECK: firmness right neck but no definitive palpable LN, likely surgical scarring. LYMPH:  no palpable lymphadenopathy in the cervical, axillary or inguinal LUNGS: scattered rhonchi, HEART: regular rate & rhythm and no murmurs and no lower extremity edema ABDOMEN:abdomen soft, non-tender and normal bowel sounds Musculoskeletal:no cyanosis of digits and no clubbing  PSYCH: alert & oriented x 3 with fluent speech NEURO: no focal motor/sensory deficits  LABORATORY DATA:  I have reviewed the data as listed Lab Results  Component Value Date   WBC 3.6 (L) 01/03/2020   HGB 12.8 (L) 01/03/2020   HCT 39.1 01/03/2020   MCV 94.4 01/03/2020   PLT 193 01/03/2020     Chemistry      Component Value Date/Time   NA 142 01/03/2020 1355   K 3.4 (L) 01/03/2020 1355   CL 105 01/03/2020 1355   CO2 29 01/03/2020 1355   BUN 9 08/30/2020 1351   CREATININE 0.56 (L) 08/30/2020 1351   CREATININE 0.61 (L) 10/26/2019 1523      Component Value Date/Time   CALCIUM 9.0 01/03/2020 1355   ALKPHOS 41 01/03/2020 1355   AST 17 01/03/2020 1355   AST 60 (H) 05/04/2019 1207   ALT 14 01/03/2020 1355   ALT 53 (H) 09/06/2019 1543   BILITOT 0.5 01/03/2020 1355   BILITOT 0.5 05/04/2019 1207       RADIOGRAPHIC STUDIES: I have personally reviewed the radiological images as listed and agreed with the findings in the report. NM PET Image Restag (PS) Skull Base To Thigh  Result Date: 08/31/2020 CLINICAL DATA:  Subsequent treatment strategy for head neck carcinoma staging. Laryngeal carcinoma diagnosis July 2020. Patient undergone surgery and radiation treatment. Recurrence RIGHT level 2 lymph node recurrence followed modified RIGHT neck dissection revealing  squamous cell carcinoma for 19 days EXAM: NUCLEAR MEDICINE PET SKULL BASE TO THIGH TECHNIQUE: 7.3 mCi F-18 FDG was injected intravenously. Full-ring PET imaging was performed from the skull base to thigh after the radiotracer. CT data was obtained and used for attenuation correction and anatomic localization. Fasting blood glucose: 90 mg/dl  COMPARISON:  Neck CT 07/05/2020, PET-CT 10/27/2019. FINDINGS: Mediastinal blood pool activity: SUV max 2.0 Liver activity: SUV max NA NECK: Intense metabolic activity localizing to the RIGHT lateral hypopharynx with SUV max equal 10.6. This hypermetabolic activity corresponds to abnormal asymmetric thickening on comparison CT. The metabolic activity appears confined to the pharyngeal mucosa. The activity spans a large surface area from the level of the palatine and lingual tonsils inferiorly to the level of the vallecula. Hypermetabolic lesion posterior to the margin of the RIGHT sternocleidomastoid muscle with SUV max equal 3.8 (image 242). This tissue is poorly defined by CT imaging. On comparison contrast CT there is a enhancing nodule at this location (image 61/series 3/CT 07/05/2020). No contralateral hypermetabolic nodes. There is hypermetabolic lesion within the LEFT parotid gland measuring 5 mm with SUV max equal 3.5 on image 264 of the fused data set. 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 Incidental CT findings: none CHEST: No hypermetabolic mediastinal or hilar nodes. No suspicious pulmonary nodules on the CT scan. Incidental CT findings: none ABDOMEN/PELVIS: No abnormal hypermetabolic activity within the liver, pancreas, adrenal glands, or spleen. No hypermetabolic lymph nodes in the abdomen or pelvis. Incidental CT findings: none SKELETON: No focal hypermetabolic activity to suggest skeletal metastasis. Incidental CT findings: none IMPRESSION: 1. Recurrence of head neck carcinoma with a broad  hypermetabolic pharyngeal mucosal lesion involving the RIGHT lateral posterior oropharynx and hypopharynx extending from the palatine tonsil to the vallecula. 2. Hypermetabolic lymph node posterior sternocleidomastoid muscle on the RIGHT (level 3). 3. Hypermetabolic nodule within the LEFT parotid glands favored small primary parotid neoplasm. 4. No evidence thoracic metastasis. Electronically Signed   By: Suzy Bouchard M.D.   On: 08/31/2020 11:58    All questions were answered. The patient knows to call the clinic with any problems, questions or concerns. I spent 60 minutes in the care of this patient including H and P, review of records, counseling and coordination of care.    [name], MD 09/10/2020 2:59 PM

## 2020-09-10 NOTE — Progress Notes (Signed)
   Covid-19 Vaccination Clinic  Name:  [name]    MRN: [id number] DOB: 1951/01/21  09/10/2020  [name] was observed post Covid-19 immunization for 15 minutes without incident. He was provided with Vaccine Information Sheet and instruction to access the V-Safe system.   [name] was instructed to call 911 with any severe reactions post vaccine: Marland Kitchen Difficulty breathing  . Swelling of face and throat  . A fast heartbeat  . A bad rash all over body  . Dizziness and weakness   Immunizations Administered    Name Date Dose VIS Date Route   Pfizer COVID-19 Vaccine 09/10/2020  2:36 PM 0.3 mL 07/04/2020 Intramuscular   Manufacturer: [employer]   Lot: [id number]   [place]: [id number]

## 2020-09-11 LAB — SURGICAL PATHOLOGY

## 2020-09-11 NOTE — Progress Notes (Signed)
Oncology Nurse Navigator Documentation  I called [facility] to follow up on Mr. Charney' p16 results requested on 09/03/20 by me. I spoke with Deanna Artis and learned that the request had not been sent. She will speak to Dr. Luisa Hart today and put a rush on the results and tells me that the final results of p16 testing should be available tomorrow for review.   Hedda Slade RN, BSN, OCN Head & Neck Oncology Nurse Navigator [facility] at [facility] Phone # [phone]  Fax # [phone]

## 2020-09-12 ENCOUNTER — Other Ambulatory Visit: Payer: Self-pay

## 2020-09-12 DIAGNOSIS — C321 Malignant neoplasm of supraglottis: Secondary | ICD-10-CM

## 2020-09-13 ENCOUNTER — Telehealth: Payer: Self-pay | Admitting: Licensed Clinical Social Worker

## 2020-09-13 NOTE — Telephone Encounter (Signed)
[facility] Clinical Social Work  Clinical Social Work was referred by [facility] manager for assessment of psychosocial needs for new H&N patient.  Clinical Social Worker attempted to contact patient by phone. No answer. Unable to leave VM.  Patient should be attending [facility] where a CSW will introduce services.      Jaxon Flatt E Rigo Letts, LCSW  Clinical Social Worker [facility]

## 2020-09-17 DIAGNOSIS — C329 Malignant neoplasm of larynx, unspecified: Secondary | ICD-10-CM | POA: Insufficient documentation

## 2020-09-18 ENCOUNTER — Other Ambulatory Visit: Payer: Self-pay

## 2020-09-18 ENCOUNTER — Encounter: Payer: Self-pay | Admitting: *Deleted

## 2020-09-18 ENCOUNTER — Ambulatory Visit
Admission: RE | Admit: 2020-09-18 | Discharge: 2020-09-18 | Disposition: A | Discharge status: Home / Self Care | Payer: Medicare HMO | Source: Ambulatory Visit | Attending: Radiation Oncology | Admitting: Radiation Oncology

## 2020-09-18 ENCOUNTER — Inpatient Hospital Stay: Payer: Medicare HMO | Admitting: Hematology and Oncology

## 2020-09-18 DIAGNOSIS — C329 Malignant neoplasm of larynx, unspecified: Secondary | ICD-10-CM | POA: Diagnosis not present

## 2020-09-18 NOTE — Progress Notes (Signed)
Oncology Nurse Navigator Documentation  To provide support, encouragement and care continuity, met with [name] for his initial RT.    I reviewed the 2-step treatment process, answered questions.   [name] completed treatment without difficulty, denied questions/concerns.  I reviewed the registration/arrival procedure for subsequent treatments.  I encouraged them to call me with questions/concerns as tmts proceed.   Harlow Asa RN, BSN, OCN Head & Neck Oncology Nurse [place] at [facility] Phone # [phone]  Fax # [phone]

## 2020-09-18 NOTE — Progress Notes (Signed)
[facility] Clinical Social Work  Visual merchandiser made 2nd attempt to contact patient to offer support and assess for psychosocial needs.  CSW left patient a voicemail offering support and encouraging patient to return call.  Tamala Julian, MSW, LCSW, OSW-C Clinical Social Worker [facility] [phone]

## 2020-09-19 ENCOUNTER — Ambulatory Visit
Admission: RE | Admit: 2020-09-19 | Discharge: 2020-09-19 | Disposition: A | Discharge status: Home / Self Care | Payer: Medicare HMO | Source: Ambulatory Visit | Attending: Radiation Oncology | Admitting: Radiation Oncology

## 2020-09-19 ENCOUNTER — Encounter: Payer: Self-pay | Admitting: General Practice

## 2020-09-19 ENCOUNTER — Inpatient Hospital Stay: Payer: Medicare HMO | Attending: Hematology and Oncology | Admitting: Hematology and Oncology

## 2020-09-19 ENCOUNTER — Other Ambulatory Visit: Payer: Self-pay

## 2020-09-19 ENCOUNTER — Telehealth: Payer: Self-pay

## 2020-09-19 ENCOUNTER — Encounter: Payer: Self-pay | Admitting: Hematology and Oncology

## 2020-09-19 DIAGNOSIS — C090 Malignant neoplasm of tonsillar fossa: Secondary | ICD-10-CM

## 2020-09-19 DIAGNOSIS — Z923 Personal history of irradiation: Secondary | ICD-10-CM | POA: Insufficient documentation

## 2020-09-19 DIAGNOSIS — Z5111 Encounter for antineoplastic chemotherapy: Secondary | ICD-10-CM | POA: Insufficient documentation

## 2020-09-19 DIAGNOSIS — Z931 Gastrostomy status: Secondary | ICD-10-CM | POA: Insufficient documentation

## 2020-09-19 DIAGNOSIS — C329 Malignant neoplasm of larynx, unspecified: Secondary | ICD-10-CM | POA: Diagnosis not present

## 2020-09-19 DIAGNOSIS — Z87891 Personal history of nicotine dependence: Secondary | ICD-10-CM | POA: Insufficient documentation

## 2020-09-19 DIAGNOSIS — C321 Malignant neoplasm of supraglottis: Secondary | ICD-10-CM | POA: Diagnosis not present

## 2020-09-19 MED ORDER — LORAZEPAM 0.5 MG PO TABS: 0.5000 mg | ORAL_TABLET | Freq: Four times a day (QID) | ORAL | 0 refills | Status: DC | PRN

## 2020-09-19 MED ORDER — ONDANSETRON HCL 8 MG PO TABS: 8.0000 mg | ORAL_TABLET | Freq: Two times a day (BID) | ORAL | 1 refills | Status: DC | PRN

## 2020-09-19 MED ORDER — LIDOCAINE-PRILOCAINE 2.5-2.5 % EX CREA: TOPICAL_CREAM | CUTANEOUS | 3 refills | Status: DC

## 2020-09-19 MED ORDER — PROCHLORPERAZINE MALEATE 10 MG PO TABS: 10.0000 mg | ORAL_TABLET | Freq: Four times a day (QID) | ORAL | 1 refills | Status: DC | PRN

## 2020-09-19 MED ORDER — DEXAMETHASONE 4 MG PO TABS: 8.0000 mg | ORAL_TABLET | Freq: Every day | ORAL | 1 refills | Status: DC

## 2020-09-19 NOTE — Telephone Encounter (Signed)
Nutrition  Caregiver Suzette Battiest called inquiring about setting up appointment with nutrition and getting more ensure.  Ensure plus taken to registration for patient to pick up tomorrow. Informed Suzette Battiest that patient will be meeting with nutrition on Wednesday, Jan 12 at [facility] after radiation.  She verbalized understanding and will come to appointment as well with patient.   Nickie Deren B. Freida Busman, RD, LDN Registered Dietitian [phone] (mobile)

## 2020-09-19 NOTE — Progress Notes (Signed)
[facility] CSW Progress Notes  Call to patient to assess for needs/resources per referral - he is new head/neck cancer patient.  Spoke w friend - he was not available.  She will have him call us back, gave my contact information.  Santa Genera, LCSW Clinical Social Worker Phone:  [phone]

## 2020-09-19 NOTE — Progress Notes (Signed)
START ON PATHWAY REGIMEN - Head and Neck     A cycle is every 7 days:     Cisplatin   **Always confirm dose/schedule in your pharmacy ordering system**  Patient Characteristics: Larynx, Local Recurrence, Candidate for Radiation Therapy Disease Classification: Larynx AJCC T Category: T2 AJCC 8 Stage Grouping: Unknown Therapeutic Status: Local Recurrence AJCC N Category: NX AJCC M Category: M0 Is patient a candidate for radiation therapy<= Yes Intent of Therapy: Curative Intent, Discussed with Patient

## 2020-09-19 NOTE — Progress Notes (Signed)
[name] NOTE  Patient Care Team: [name], MD as PCP - General (Internal Medicine) Izora Gala, MD as Consulting Physician (Otolaryngology) [name], MD as Attending Physician (Radiation Oncology) Leota Sauers, RN (Inactive) as Oncology Nurse Navigator Karie Mainland, RD as Dietitian (Nutrition) Valentino Saxon Perry Mount, CCC-SLP as Speech Language Pathologist (Speech Pathology) Kennith Center, LCSW as Social Worker  CHIEF COMPLAINTS/PURPOSE OF CONSULTATION:  Recurrent SCC of larynx  ASSESSMENT & PLAN:   No problem-specific Assessment & Plan notes found for this encounter.  No orders of the defined types were placed in this encounter.  This is a very pleasant 70 year old male patient with history of recurrent laryngeal cancer now with a right oropharyngeal mass referred to medical oncology for further recommendations.  Please refer to the below mentioned history.   He was first diagnosed with T2N0 laryngeal cancer back in August 2020 and underwent definitive radiotherapy since he refused surgical treatment.  He then had a recurrence in Oct 2021 and had modified radical neck dissection, now presented with soft tissue thickening and enhancement along the right lateral pharynx. PET showed recurrence of head and neck cancer with broad hypermetabolic pharyngeal mucosal lesion involving right lateral posterior oropharynx and hypopharynx extending from palatine tonsil to the vallecula.Hypermetabolic lymph node posterior sternocleidomastoid muscle on the RIGHT (level 3).Hypermetabolic nodule within the LEFT parotid glands favored small primary parotid neoplasm. He saw Dr. Conley Canal from [facility], and recommendation is to proceed with chemo radiation first and consider salvage surgery if incomplete response. I discussed that this is a reasonable approach given his recurrence. I discussed about side effects from cisplatin including but not limited to fatigue,  nausea, vomiting, increased risk of infections, ototoxicity, nephrotoxicity and clearly explained that some of the side effects can be permanent.  I have encouraged considering a PEG tube and a Port-A-Cath for chemotherapy administration. He is agreeable and this has been ordered.  He understands that there is no guarantee for cure despite what ever modality of treatment we choose. All his questions were answered to the best of my knowledge.   Thank you for consulting [place] in the care of this patient.  HISTORY OF PRESENTING ILLNESS:   Roarke E Capel 70 y.o. male is here because of laryngeal cancer  Tayo Letona Oliveria is a 70 y.o. male who has been treated for laryngeal cancer in the past.   Aug 2020, Pt had presented with dysphagia and difficulty breathing through his mouth, 20 lb weight loss. He saw Dr. Erik Obey who performed laryngoscopy and appreciated a 2 to 3 cm ovoid pedunculated mass arising from the right aryepiglottic fold and impinging on the supraglottic airway.  The vocal cords appeared to be mobile.  Urgent panendoscopy and biopsy were recommended to protect his airway.  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 According to his op note,a bulky necrotic and semi-pedunculated tumor coming off the lateral surface of the right aryepiglottic fold and the anterior and lateral aspect of the piriform sinus on the right side. Airway was compromised by the tumor mass at intubation.  CT scan of the neck on 04/11/2019 showed glottic closure with no asymmetry of the cords, correlate with laryngoscopy results.  Questionable right level 2 lymph node measured 7 mm.  No definite pathologic lymph nodes in the neck.  PET scan on 04/22/2019 revealed mild residual activity in the posterior right hypopharynx confined to the mucosa with no evidence of  metastatic disease to the neck or distantly.  He underwent definitive  radiotherapy to T2N0 laryngeal cancer  since he refused surgical treatment.  He then had PET imaging which showed a right neck lymph node, suspicious on PET. Biopsy of right neck lymph node is positive for recurrence. He underwent right modified radical neck dissection including levels 2 and 3, including the internal jugular vein, suture marks the inferior jugular vein stump.  He most recently underwent a soft tissue neck CT scan on the date of 07/05/2020 that revealed asymmetric soft tissue thickening and enhancement along the right lateral pharynx. It also showed suspicious small 6 mm hyperenhancing nodular soft tissue along the posterior margin of the neck dissect at right level 3. Additionally, there was a mild inflammatory appearing right upper lobe centrilobular ground-glass opacity that was new (consider mild or developing upper lobe infection), post radiation changes to the lung apices, and sequelae of radiation and right neck dissection from previous neck CT.  Subsequently, the patient underwent a direct laryngoscopy and biopsy of oropharyngeal mass on the date of 08/13/2020. Pathology from the procedure revealed poorly differentiated squamous cell carcinoma.  PET showed recurrence of head and neck cancer with broad hypermetabolic pharyngeal mucosal lesion involving right lateral posterior oropharynx and hypopharynx extending from palatine tonsil to the vallecula.Hypermetabolic lymph node posterior sternocleidomastoid muscle on the RIGHT (level 3).Hypermetabolic nodule within the LEFT parotid glands favored small primary parotid neoplasm. When I first saw him for his out patient visit, he was still awaiting recommendations from Dr Leonia Reeves from [facility]. He is here for FU visit. He is planning to proceed with chemo radiation. No new complaints since I last talked to him. Some ongoing difficulty with swallowing.  No baseline hearing loss. No other complaints for me. He smoked about 1 ppweek, used  to drink 40 oz beer daily, occasionally hard liquor. He is a Curator by occupation.  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 Ears, nose, mouth, throat, and face: as mentioned.Respiratory: Denies cough, dyspnea or wheezes Cardiovascular: Denies palpitation, chest discomfort or lower extremity swelling Gastrointestinal:  Denies nausea, heartburn or change in bowel habits Skin: Denies abnormal skin rashes Lymphatics: Denies new lymphadenopathy or easy bruising Neurological:Denies numbness, tingling or new weaknesses Behavioral/Psych: Mood is stable, no new changes  All other systems were reviewed with the patient and are negative.  MEDICAL HISTORY:  Past Medical History:  Diagnosis Date  . Cancer ([place])    Throat cancer 2019  . ETOH abuse   . Frequent urination   . Glaucoma   . Hepatitis C virus infection without hepatic coma    dx'ed in 11/2018  . History of radiation therapy 05/12/19- 07/06/19   Larynx  . Hypertension   . Wears denture    upper only; lost lower denture    SURGICAL HISTORY: Past Surgical History:  Procedure Laterality Date  . ANKLE SURGERY  2011   right ankle  . COLONOSCOPY  02/2019   polyps - Dr Havery Moros  . DIRECT LARYNGOSCOPY N/A 04/01/2019   Procedure: DIRECT LARYNGOSCOPY WITH BIOPSY;  Surgeon: Jodi Marble, MD;  Location: [place];  Service: ENT;  Laterality: N/A;  . DIRECT LARYNGOSCOPY N/A 01/06/2020   Procedure: DIRECT LARYNGOSCOPY;  Surgeon: Izora Gala, MD;  Location: [place];  Service: ENT;  Laterality: N/A;  . DIRECT LARYNGOSCOPY N/A 08/13/2020   Procedure: DIRECT LARYNGOSCOPY;  Surgeon: Izora Gala, MD;  Location: [place];  Service:  ENT;  Laterality: N/A;  . ESOPHAGOSCOPY N/A 04/01/2019   Procedure: ESOPHAGOSCOPY;  Surgeon: Jodi Marble, MD;  Location: [place];  Service: ENT;  Laterality: N/A;  . EXCISION ORAL TUMOR Right 08/13/2020   Procedure: BIOPSY OF  OROPHARYNGEAL MASS;  Surgeon: Izora Gala, MD;  Location: [place];  Service: ENT;  Laterality: Right;  . EYE SURGERY Right   . KNEE SURGERY    . LARYNGOSCOPY AND BRONCHOSCOPY N/A 04/01/2019   Procedure: BRONCHOSCOPY;  Surgeon: Jodi Marble, MD;  Location: [place];  Service: ENT;  Laterality: N/A;  . RADICAL NECK DISSECTION N/A 01/06/2020   Procedure: RADICAL NECK DISSECTION;  Surgeon: Izora Gala, MD;  Location: [facility];  Service: ENT;  Laterality: N/A;    SOCIAL HISTORY: Social History   Socioeconomic History  . Marital status: Single    Spouse name: Not on file  . Number of children: 2  . Years of education: Not on file  . Highest education level: Not on file  Occupational History  . Not on file  Tobacco Use  . Smoking status: Former Smoker    Years: 50.00    Types: Cigarettes    Quit date: 06/01/2020    Years since quitting: 0.3  . Smokeless tobacco: Never Used  Vaping Use  . Vaping Use: Never used  Substance and Sexual Activity  . Alcohol use: Yes    Alcohol/week: 4.0 standard drinks    Types: 4 Cans of beer per week    Comment: 40oz beer daily  . Drug use: Yes    Types: Cocaine    Comment: none in 2 yrs  . Sexual activity: Yes    Partners: Female  Other Topics Concern  . Not on file  Social History Narrative   Patient is divorced with 2 children.   Patient is currently living with his sister.   Patient with a history of smoking a third of pack of cigarettes daily for 50 years.  Patient currently smoking 2 to 3 cigarettes/day.   Patient has never used smokeless tobacco.   Patient with occasional use of alcohol.   Patient last used cocaine approximately 6 months ago.  Patient denies use of marijuana.   Social Determinants of Health   Financial Resource Strain: Not on file  Food Insecurity: Not on file  Transportation Needs: Not on file  Physical Activity: Not on file  Stress: Not on file  Social Connections: Not on file  Intimate Partner  Violence: Not on file    FAMILY HISTORY: Family History  Problem Relation Age of Onset  . Breast cancer Sister   . Colon cancer Brother 59       ????  . Cancer Brother     ALLERGIES:  has No Known Allergies.  MEDICATIONS:  Current Outpatient Medications  Medication Sig Dispense Refill  . atropine 1 % ophthalmic solution Place 1 drop into the right eye 2 (two) times a day.     . cetirizine (ZYRTEC) 10 MG tablet Take 10 mg by mouth daily as needed for allergies.    Marland Kitchen HYDROcodone-acetaminophen (HYCET) 7.5-325 mg/15 ml solution Take 15 mLs by mouth 4 (four) times daily as needed for moderate pain. 473 each 0  . ibuprofen (ADVIL) 200 MG tablet Take 400 mg by mouth every 6 (six) hours as needed for headache or moderate pain.    Marland Kitchen latanoprost (XALATAN) 0.005 % ophthalmic solution Place 1 drop into the right eye at bedtime.     . lidocaine (XYLOCAINE)  2 % solution Patient: Mix 1part 2% viscous lidocaine, 1part H20. Swish AND swallow 24mL of diluted mixture, before meals and at bedtime, up to QID 200 mL 3  . losartan (COZAAR) 50 MG tablet Take 50 mg by mouth daily.    . prednisoLONE acetate (PRED FORTE) 1 % ophthalmic suspension Place 1 drop into both eyes daily.    Marland Kitchen tolterodine (DETROL LA) 4 MG 24 hr capsule Take 4 mg by mouth daily.    . Vitamin D, Ergocalciferol, (DRISDOL) 1.25 MG (50000 UNIT) CAPS capsule Take 50,000 Units by mouth once a week.     No current facility-administered medications for this visit.     PHYSICAL EXAMINATION: ECOG PERFORMANCE STATUS: 1 - Symptomatic but completely ambulatory  There were no vitals filed for this visit. There were no vitals filed for this visit.  VS not recorded, phone visit  PE not done, phone visit.   LABORATORY DATA:  I have reviewed the data as listed Lab Results  Component Value Date   WBC 3.6 (L) 01/03/2020   HGB 12.8 (L) 01/03/2020   HCT 39.1 01/03/2020   MCV 94.4 01/03/2020   PLT 193 01/03/2020     Chemistry       Component Value Date/Time   NA 142 01/03/2020 1355   K 3.4 (L) 01/03/2020 1355   CL 105 01/03/2020 1355   CO2 29 01/03/2020 1355   BUN 9 08/30/2020 1351   CREATININE 0.56 (L) 08/30/2020 1351   CREATININE 0.61 (L) 10/26/2019 1523      Component Value Date/Time   CALCIUM 9.0 01/03/2020 1355   ALKPHOS 41 01/03/2020 1355   AST 17 01/03/2020 1355   AST 60 (H) 05/04/2019 1207   ALT 14 01/03/2020 1355   ALT 53 (H) 09/06/2019 1543   BILITOT 0.5 01/03/2020 1355   BILITOT 0.5 05/04/2019 1207       RADIOGRAPHIC STUDIES: I have personally reviewed the radiological images as listed and agreed with the findings in the report. NM PET Image Restag (PS) Skull Base To Thigh  Result Date: 08/31/2020 CLINICAL DATA:  Subsequent treatment strategy for head neck carcinoma staging. Laryngeal carcinoma diagnosis July 2020. Patient undergone surgery and radiation treatment. Recurrence RIGHT level 2 lymph node recurrence followed modified RIGHT neck dissection revealing squamous cell carcinoma for 19 days EXAM: NUCLEAR MEDICINE PET SKULL BASE TO THIGH TECHNIQUE: 7.3 mCi F-18 FDG was injected intravenously. Full-ring PET imaging was performed from the skull base to thigh after the radiotracer. CT data was obtained and used for attenuation correction and anatomic localization. Fasting blood glucose: 90 mg/dl COMPARISON:  Neck CT 64/33/2951, PET-CT 10/27/2019. FINDINGS: Mediastinal blood pool activity: SUV max 2.0 Liver activity: SUV max NA NECK: Intense metabolic activity localizing to the RIGHT lateral hypopharynx with SUV max equal 10.6. This hypermetabolic activity corresponds to abnormal asymmetric thickening on comparison CT. The metabolic activity appears confined to the pharyngeal mucosa. The activity spans a large surface area from the level of the palatine and lingual tonsils inferiorly to the level of the vallecula. Hypermetabolic lesion posterior to the margin of the RIGHT sternocleidomastoid muscle with  SUV max equal 3.8 (image 242). This tissue is poorly defined by CT imaging. On comparison contrast CT there is a enhancing nodule at this location (image 61/series 3/CT 07/05/2020). No contralateral hypermetabolic nodes. There is hypermetabolic lesion within the LEFT parotid gland measuring 5 mm with SUV max equal 3.5 on image 264 of the fused data set. This appears stable over multiple  comparison exams and likely represents a primary parotid neoplasm. Incidental CT findings: none CHEST: No hypermetabolic mediastinal or hilar nodes. No suspicious pulmonary nodules on the CT scan. Incidental CT findings: none ABDOMEN/PELVIS: No abnormal hypermetabolic activity within the liver, pancreas, adrenal glands, or spleen. No hypermetabolic lymph nodes in the abdomen or pelvis. Incidental CT findings: none SKELETON: No focal hypermetabolic activity to suggest skeletal metastasis. Incidental CT findings: none IMPRESSION: 1. Recurrence of head neck carcinoma with a broad hypermetabolic pharyngeal mucosal lesion involving the RIGHT lateral posterior oropharynx and hypopharynx extending from the palatine tonsil to the vallecula. 2. Hypermetabolic lymph node posterior sternocleidomastoid muscle on the RIGHT (level 3). 3. Hypermetabolic nodule within the LEFT parotid glands favored small primary parotid neoplasm. 4. No evidence thoracic metastasis. Electronically Signed   By: Suzy Bouchard M.D.   On: 08/31/2020 11:58    All questions were answered. The patient knows to call the clinic with any problems, questions or concerns.  I spent 30 minutes in the care of this patient including History, review of records, counseling and coordination of care.  I connected with  [name] on 09/19/20 by telephone and verified that I am speaking with the correct person using two identifiers.   I discussed the limitations of evaluation and management by telemedicine. The patient expressed understanding and agreed to proceed.     [name], MD 09/19/2020 11:02 AM

## 2020-09-20 ENCOUNTER — Telehealth (HOSPITAL_COMMUNITY): Payer: Self-pay

## 2020-09-20 ENCOUNTER — Other Ambulatory Visit: Payer: Self-pay

## 2020-09-20 ENCOUNTER — Ambulatory Visit
Admission: RE | Admit: 2020-09-20 | Discharge: 2020-09-20 | Disposition: A | Discharge status: Home / Self Care | Payer: Medicare HMO | Source: Ambulatory Visit | Attending: Radiation Oncology | Admitting: Radiation Oncology

## 2020-09-20 ENCOUNTER — Encounter: Payer: Self-pay | Admitting: General Practice

## 2020-09-20 DIAGNOSIS — C329 Malignant neoplasm of larynx, unspecified: Secondary | ICD-10-CM | POA: Diagnosis not present

## 2020-09-20 NOTE — Telephone Encounter (Signed)
-----   Message from Oley Balm, MD sent at 09/20/2020 12:45 PM EST ----- Regarding: RE: Peg/port placement Ok  Anatomy amenable to perc G tube  DDH    ----- Message ----- From: [name] Malta Sent: 09/20/2020  10:37 AM EST To: Ir Procedure Requests Subject: Peg/port placement                             Procedure: Peg/port placement  Dx: SCC (squamous cell carcinoma) of supraglottis - head and neck cancer scheduled for chemo radiation  Ordering: Dr. Rachel Moulds [phone]  Imaging: NM Pet 08/30/20 in epic   Please review.   Thanks,  Fara Boros

## 2020-09-20 NOTE — Progress Notes (Signed)
[facility] CSW Progress Notes  Fourth attempt to reach patient to complete Distress Screen and psychosocial assessment.  Unable to reach.  Scheduled appointment to see him when at [facility].  Santa Genera, LCSW Clinical Social Worker Phone:  [phone]

## 2020-09-21 ENCOUNTER — Other Ambulatory Visit: Payer: Self-pay | Admitting: Student

## 2020-09-21 ENCOUNTER — Ambulatory Visit
Admission: RE | Admit: 2020-09-21 | Discharge: 2020-09-21 | Disposition: A | Discharge status: Home / Self Care | Payer: Medicare HMO | Source: Ambulatory Visit | Attending: Radiation Oncology | Admitting: Radiation Oncology

## 2020-09-21 ENCOUNTER — Other Ambulatory Visit (HOSPITAL_COMMUNITY)
Admission: RE | Admit: 2020-09-21 | Discharge: 2020-09-21 | Disposition: A | Discharge status: Home / Self Care | Payer: Medicare HMO | Source: Ambulatory Visit | Attending: Radiation Oncology | Admitting: Radiation Oncology

## 2020-09-21 ENCOUNTER — Inpatient Hospital Stay: Payer: Medicare HMO | Admitting: General Practice

## 2020-09-21 ENCOUNTER — Telehealth: Payer: Self-pay

## 2020-09-21 ENCOUNTER — Other Ambulatory Visit (HOSPITAL_COMMUNITY): Payer: Self-pay

## 2020-09-21 ENCOUNTER — Other Ambulatory Visit (HOSPITAL_COMMUNITY): Payer: Self-pay | Admitting: Hematology and Oncology

## 2020-09-21 ENCOUNTER — Other Ambulatory Visit: Payer: Self-pay

## 2020-09-21 ENCOUNTER — Encounter: Payer: Self-pay | Admitting: Hematology and Oncology

## 2020-09-21 ENCOUNTER — Inpatient Hospital Stay: Payer: Medicare HMO

## 2020-09-21 ENCOUNTER — Telehealth: Payer: Self-pay | Admitting: Hematology and Oncology

## 2020-09-21 DIAGNOSIS — C090 Malignant neoplasm of tonsillar fossa: Secondary | ICD-10-CM

## 2020-09-21 DIAGNOSIS — Z01812 Encounter for preprocedural laboratory examination: Secondary | ICD-10-CM | POA: Diagnosis present

## 2020-09-21 DIAGNOSIS — C329 Malignant neoplasm of larynx, unspecified: Secondary | ICD-10-CM

## 2020-09-21 DIAGNOSIS — R1310 Dysphagia, unspecified: Secondary | ICD-10-CM

## 2020-09-21 DIAGNOSIS — Z20822 Contact with and (suspected) exposure to covid-19: Secondary | ICD-10-CM | POA: Insufficient documentation

## 2020-09-21 DIAGNOSIS — C321 Malignant neoplasm of supraglottis: Secondary | ICD-10-CM

## 2020-09-21 MED ORDER — HYDROCODONE-ACETAMINOPHEN 7.5-325 MG/15ML PO SOLN: 15.0000 mL | Freq: Four times a day (QID) | ORAL | 0 refills | Status: AC | PRN

## 2020-09-21 NOTE — Progress Notes (Signed)
Met with patient at registration to introduce myself as Financial Resource Specialist and to offer available resources.  Discussed a one-time $1000 [employer] grant to assist with personal expenses while going through treatment.  Gave him my card if interested in applying and for any additional financial questions or concerns. 

## 2020-09-21 NOTE — Telephone Encounter (Signed)
Scheduled appointments per 1/6 sch msg. Updated calendar printed for patient today per RN Myrtle.

## 2020-09-21 NOTE — Telephone Encounter (Signed)
The Patient has requested a refill on his Hydrocodone. Per Dr. Chryl Heck, I contacted the patient to inquire further into his pain and how he has been taking the Hydrocodone. The Patient states that his pain is in his throat, right ear, and the right side of his neck. He states that the pain lingers consistently throughout the day. Mr. Pottinger describes the pain in his ear as a very deep painful sharp shooting pain. He also states that on the pain scale when he swallows the pain is an 8. The Patient states that he has been taking the Hydrocodone every morning when he wakes up and every evening when he goes to bed. He also confirms that sometimes he has to take it in the afternoon if the pain is too strong. Dr. Chryl Heck has been made aware of this information and I informed the Pt that Dr. Chryl Heck will help him manage his pain while undergoing treatment and for a couple of months after but that this would not be Leva Baine term. The Patient verbalized understanding.

## 2020-09-21 NOTE — Progress Notes (Signed)
Oncology Nurse Navigator Documentation  Met with Mr. Tamala Julian and his caregiver Verdene Lennert to provide PEG/port education prior to                 placement.  Provided port educational handout, showed example, provided guidance for post-surgical dsg removal, site care.  . Using  PEG teaching device   and Teach Back, provided education for PEG use and care, including: hand hygiene, gravity bolus administration of daily water flushes and nutritional supplement, fluids and medications; care of tube insertion site including daily dressing change and cleaning; S&S of infection.   . Mr. Gilmer  correctly verbalized procedures for and provided correct return demonstration of gravity administration of water, dressing change and site care.  . I provided written instructions for PEG flushing/dressing change in support of verbal instruction.   . I provided/described contents of Start of Care Bolus Feeding Kit (3 60 cc syringes, 2 boxes 4x4 drainage sponges, 1 package mesh briefs, 1 roll paper tape, 1 case Osmolite 1.5).  He voiced understanding he is to start using Osmolite per guidance of Nutrition. Marland Kitchen He understands I will be available for ongoing PEG support. Provided barium sulfate prep which I obtained from [facility] IR, reviewed instructions which included guidance for today's  COVID screening at [address] at 12:35 pm.  Harlow Asa RN, BSN, OCN Head & Neck Oncology Nurse [facility] Phone # [phone]  Fax # [phone]

## 2020-09-21 NOTE — Progress Notes (Signed)
[place] Clinical Social Work  Initial Assessment   [name] is a 70 y.o. year old male seen in [facility] lobby accompanied by supportive friend, Caprice Renshaw. Clinical Social Work was referred by treatment teamfor assessment of psychosocial needs.   SDOH (Social Determinants of Health) assessments performed: Yes   Distress Screen completed: Yes ONCBCN DISTRESS SCREENING 09/21/2020  Screening Type Initial Screening  Distress experienced in past week (1-10) 6  Practical problem type Work/school  Family Problem type Other (comment)  Emotional problem type Adjusting to illness;Boredom;Adjusting to appearance changes  Information Concerns Type Lack of info about maintaining fitness  Physical Problem type Sleep/insomnia;Mouth sores/swallowing;Talking;Constipation/diarrhea;Skin dry/itchy  Referral to clinical psychology No  Referral to clinical social work Yes  Referral to dietition Yes      Family/Social Information:  . Housing Arrangement: patient lives with sister . Family members/support persons in your life? Lives w sister, friend states she is available as needed.  Friend does not live with him.  She came with him to chemo ed class today in order to have good understanding of what he needs to do in order to be successful in treatment . Transportation concerns: No - is on [employer] and also has friend. She will take him to port placement and transport as needed going forward . Employment: Retired. Worked as a Curator . Income source:retirement income . Financial concerns: No o Type of concern: None . Food access concerns: Lives w family member, no food access concerns. Has Ensure and may need help accessing this in future due to cost . Religious or spiritual practice: unknown . Medication Concerns:none, has both Medicare and Medicaid . Services Currently in place:  none  Coping/ Adjustment to diagnosis: . Patient understands treatment plan and what happens next? Diagnosed w  recurrent laryngeal cancer, previous treatment with radiotherapy.  Now will have chemotherapy and radiation treatment. Just finished chemo education class.  Will have  . Concerns about diagnosis and/or treatment: "I just want to get started" . Patient reported stressors: none at this time . Current coping skills/ strengths: Supportive family/friends    SUMMARY: Current SDOH Barriers:  . Financial constraints related to limited income, transportation barriers addressed   Interventions: . Discussed common feeling and emotions when being diagnosed with cancer, and the importance of support during treatment . Informed patient of the support team roles and support services at [facility] . Provided CSW contact information and encouraged patient to call with any questions or concerns . Provided patient with information about Patient and [place]   Follow Up Plan: Patient will contact CSW with any support or resource needs Patient verbalizes understanding of plan: Yes    [place] Work, [place], [place] Social Worker Phone:  [phone]

## 2020-09-22 LAB — SARS CORONAVIRUS 2 (TAT 6-24 HRS): SARS Coronavirus 2: NEGATIVE

## 2020-09-24 ENCOUNTER — Other Ambulatory Visit: Payer: Medicare HMO

## 2020-09-24 ENCOUNTER — Ambulatory Visit (HOSPITAL_COMMUNITY)
Admission: RE | Admit: 2020-09-24 | Discharge: 2020-09-24 | Disposition: A | Discharge status: Home / Self Care | Payer: Medicare HMO | Source: Ambulatory Visit | Attending: Hematology and Oncology | Admitting: Hematology and Oncology

## 2020-09-24 ENCOUNTER — Ambulatory Visit: Payer: Medicare HMO | Admitting: Medical

## 2020-09-24 ENCOUNTER — Other Ambulatory Visit: Payer: Self-pay

## 2020-09-24 ENCOUNTER — Ambulatory Visit: Payer: Medicare HMO | Admitting: Physician Assistant

## 2020-09-24 ENCOUNTER — Encounter (HOSPITAL_COMMUNITY): Payer: Self-pay

## 2020-09-24 ENCOUNTER — Ambulatory Visit: Payer: Medicare HMO

## 2020-09-24 DIAGNOSIS — C760 Malignant neoplasm of head, face and neck: Secondary | ICD-10-CM | POA: Insufficient documentation

## 2020-09-24 DIAGNOSIS — E46 Unspecified protein-calorie malnutrition: Secondary | ICD-10-CM | POA: Insufficient documentation

## 2020-09-24 DIAGNOSIS — Z87891 Personal history of nicotine dependence: Secondary | ICD-10-CM | POA: Insufficient documentation

## 2020-09-24 DIAGNOSIS — Z681 Body mass index (BMI) 19 or less, adult: Secondary | ICD-10-CM | POA: Insufficient documentation

## 2020-09-24 DIAGNOSIS — Z79899 Other long term (current) drug therapy: Secondary | ICD-10-CM | POA: Diagnosis not present

## 2020-09-24 DIAGNOSIS — R1310 Dysphagia, unspecified: Secondary | ICD-10-CM | POA: Diagnosis not present

## 2020-09-24 DIAGNOSIS — C321 Malignant neoplasm of supraglottis: Secondary | ICD-10-CM | POA: Insufficient documentation

## 2020-09-24 HISTORY — PX: IR IMAGING GUIDED PORT INSERTION: IMG5740

## 2020-09-24 HISTORY — PX: IR GASTROSTOMY TUBE MOD SED: IMG625

## 2020-09-24 LAB — CBC
HCT: 42.7 % (ref 39.0–52.0)
Hemoglobin: 13.8 g/dL (ref 13.0–17.0)
MCH: 29.8 pg (ref 26.0–34.0)
MCHC: 32.3 g/dL (ref 30.0–36.0)
MCV: 92.2 fL (ref 80.0–100.0)
Platelets: 237 10*3/uL (ref 150–400)
RBC: 4.63 MIL/uL (ref 4.22–5.81)
RDW: 12.2 % (ref 11.5–15.5)
WBC: 6.3 10*3/uL (ref 4.0–10.5)
nRBC: 0 % (ref 0.0–0.2)

## 2020-09-24 LAB — PROTIME-INR
INR: 1 (ref 0.8–1.2)
Prothrombin Time: 12.8 seconds (ref 11.4–15.2)

## 2020-09-24 MED ORDER — MIDAZOLAM HCL 2 MG/2ML IJ SOLN
INTRAMUSCULAR | Status: AC | PRN
  Administered 2020-09-24: 0.5 mg via INTRAVENOUS
  Administered 2020-09-24: 1 mg via INTRAVENOUS

## 2020-09-24 MED ORDER — CEFAZOLIN SODIUM-DEXTROSE 2-4 GM/100ML-% IV SOLN
INTRAVENOUS | Status: AC
  Administered 2020-09-24: 2 g via INTRAVENOUS
  Filled 2020-09-24: qty 100

## 2020-09-24 MED ORDER — LIDOCAINE-EPINEPHRINE 1 %-1:100000 IJ SOLN
INTRAMUSCULAR | Status: AC | PRN
  Administered 2020-09-24: 20 mL via INTRADERMAL

## 2020-09-24 MED ORDER — HEPARIN SOD (PORK) LOCK FLUSH 100 UNIT/ML IV SOLN
INTRAVENOUS | Status: AC
  Filled 2020-09-24: qty 5

## 2020-09-24 MED ORDER — FENTANYL CITRATE (PF) 100 MCG/2ML IJ SOLN
INTRAMUSCULAR | Status: AC | PRN
  Administered 2020-09-24: 50 ug via INTRAVENOUS
  Administered 2020-09-24: 25 ug via INTRAVENOUS

## 2020-09-24 MED ORDER — LIDOCAINE-EPINEPHRINE 1 %-1:100000 IJ SOLN
INTRAMUSCULAR | Status: AC
  Filled 2020-09-24: qty 1

## 2020-09-24 MED ORDER — LIDOCAINE HCL 1 % IJ SOLN
INTRAMUSCULAR | Status: AC
  Filled 2020-09-24: qty 20

## 2020-09-24 MED ORDER — IOHEXOL 300 MG/ML  SOLN: 50.0000 mL | Freq: Once | INTRAMUSCULAR | Status: DC | PRN

## 2020-09-24 MED ORDER — SODIUM CHLORIDE 0.9 % IV SOLN: INTRAVENOUS | Status: DC

## 2020-09-24 MED ORDER — MIDAZOLAM HCL 2 MG/2ML IJ SOLN
INTRAMUSCULAR | Status: AC
  Filled 2020-09-24: qty 2

## 2020-09-24 MED ORDER — HEPARIN SOD (PORK) LOCK FLUSH 100 UNIT/ML IV SOLN
INTRAVENOUS | Status: AC | PRN
  Administered 2020-09-24: 500 [IU] via INTRAVENOUS

## 2020-09-24 MED ORDER — HYDROCODONE-ACETAMINOPHEN 5-325 MG PO TABS: 1.0000 | ORAL_TABLET | ORAL | Status: DC | PRN

## 2020-09-24 MED ORDER — LIDOCAINE HCL 1 % IJ SOLN
INTRAMUSCULAR | Status: AC | PRN
  Administered 2020-09-24: 20 mL via INTRADERMAL

## 2020-09-24 MED ORDER — FENTANYL CITRATE (PF) 100 MCG/2ML IJ SOLN
INTRAMUSCULAR | Status: AC
  Filled 2020-09-24: qty 2

## 2020-09-24 MED ORDER — CEFAZOLIN SODIUM-DEXTROSE 2-4 GM/100ML-% IV SOLN: 2.0000 g | Freq: Once | INTRAVENOUS | Status: AC

## 2020-09-24 NOTE — Progress Notes (Signed)
Pharmacist Chemotherapy Monitoring - Initial Assessment    Anticipated start date: 09/25/20  Regimen:  . Are orders appropriate based on the patient's diagnosis, regimen, and cycle? Yes . Does the plan date match the patient's scheduled date? Yes . Is the sequencing of drugs appropriate? Yes . Are the premedications appropriate for the patient's regimen? Yes . Prior Authorization for treatment is: Approved o If applicable, is the correct biosimilar selected based on the patient's insurance? not applicable  Organ Function and Labs: Marland Kitchen Are dose adjustments needed based on the patient's renal function, hepatic function, or hematologic function? Yes . Are appropriate labs ordered prior to the start of patient's treatment? Yes . Other organ system assessment, if indicated: N/A . The following baseline labs, if indicated, have been ordered: cisplatin: K, Mg  Dose Assessment: . Are the drug doses appropriate? Yes . Are the following correct: o Drug concentrations Yes o IV fluid compatible with drug Yes o Administration routes Yes o Timing of therapy Yes . If applicable, does the patient have documented access for treatment and/or plans for port-a-cath placement? yes . If applicable, have lifetime cumulative doses been properly documented and assessed? not applicable Lifetime Dose Tracking  No doses have been documented on this patient for the following tracked chemicals: Doxorubicin, Epirubicin, Idarubicin, Daunorubicin, Mitoxantrone, Bleomycin, Oxaliplatin, Carboplatin, Liposomal Doxorubicin  o   Toxicity Monitoring/Prevention: . The patient has the following take home antiemetics prescribed: Prochlorperazine . The patient has the following take home medications prescribed: N/A . Medication allergies and previous infusion related reactions, if applicable, have been reviewed and addressed. Yes . The patient's current medication list has been assessed for drug-drug interactions with their  chemotherapy regimen. no significant drug-drug interactions were identified on review.  Order Review: . Are the treatment plan orders signed? Yes . Is the patient scheduled to see a provider prior to their treatment? Yes  I verify that I have reviewed each item in the above checklist and answered each question accordingly.  Philomena Course 09/24/2020 10:50 AM

## 2020-09-24 NOTE — Progress Notes (Deleted)
Call was received from [place], [employer] advising that pt is refusing to present for his office visit appointment today 09/24/2020 at 2:30pm with Cassandra Helilgoetter, PA-C. PA-C has been advised of this and will follow-up with Dr. Chryl Heck.

## 2020-09-24 NOTE — Progress Notes (Signed)
Oncology Nurse Navigator Documentation  I received a phone call from [place], Mr. Kazmierczak' caregiver. He has had his PEG and PAC placed today and declines to come in to the [facility] for his scheduled appointments this afternoon due to feeling uncomfortable after his surgery. He was scheduled for lab and to see Cassie Hellingoetter PA in anticipation of starting chemotherapy tomorrow. I have notified Cassie's nurse that Mr. Arizola will miss his appointment. I have advised Verdene Lennert that Mr. Otting will need to be at [facility] at 7:30 tomorrow to have labs and see a provider before receiving chemotherapy and she voiced her understanding. I have also called the [facility] and notified them that Mr. Kasler will miss his treatment today.   Harlow Asa RN, BSN, OCN Head & Neck Oncology Nurse [place] [facility] Phone # [phone]  Fax # [phone]

## 2020-09-24 NOTE — Discharge Instructions (Addendum)
Gastrostomy Tube Home Guide, Adult A gastrostomy tube, or G-tube, is a tube that is inserted through the abdomen into the stomach. The tube is used to give feedings and medicines when a person cannot eat and drink enough on his or her own or take medicines by mouth. How to care for the insertion site Supplies needed:  Saline solution or clean, warm water and soap. Saline solution is made of salt and water.  Cotton swab or gauze.  Pre-cut gauze bandage (dressing) and tape, if needed. Instructions Follow these steps daily to clean the insertion site: 1. Wash your hands with soap and water for at least 20 seconds. 2. Remove the dressing (if there is one) that is between the person's skin and the tube. 3. Check the area where the tube enters the skin. Check daily for problems such as: ? Redness, rash, or irritation. ? Swelling. ? Pus-like drainage. ? Extra skin growth. 4. Moisten the cotton swab or gauze with the saline solution or with a soap-and-water mixture. Gently clean around the insertion site. Remove any drainage or crusted material. ? When the G-tube is first put in, a normal saline solution or water can be used to clean the skin. ? After the skin around the tube has healed, mild soap and water may be used. 5. Apply a dressing (if there should be one) between the person's skin and the tube.   How to flush a G-tube Flush the G-tube regularly to keep it from clogging. Flush it before and after feedings and as often as told by the health care provider. Supplies needed:  Purified or germ-free (sterile) water, warmed.  Container with lid for boiling water, if needed.  60 cc G-tube syringe. Instructions Before you begin, decide whether to use sterile water or purified drinking water.  Use only sterile water if: ? The person has a weak disease-fighting (immune) system. ? The person has trouble fighting off infections (is immunocompromised). ? You are unsure about the amount of  chemical contaminants in purified or drinking water.  Use purified drinking water in all other cases. To purify drinking water by boiling: ? Boil water for at least 1 minute. Keep lid over water while it boils. ? Let water cool to room temperature before using. Follow these steps to flush the G-tube: 1. Wash your hands with soap and water for at least 20 seconds. 2. Bring out (draw up) 30 mL of warm water in a syringe. 3. Connect the syringe to the tube. 4. Slowly and gently push the water into the tube. General tips  If the tube comes out: ? Cover the opening with a clean dressing and tape. ? Get help right away.  If there is skin or scar tissue growing where the tube enters the skin: ? Keep the area clean and dry. ? Secure the tube with tape so that the tube does not move around too much.  If the tube gets clogged: ? Slowly push warm water into the tube with a large syringe. ? Do not force the fluid into the tube or push an object into the tube. ? Get help right away if you cannot unclog the tube. Follow these instructions at home: Feedings  Give feedings at room temperature.  If feedings are continuous: ? Do not put more than 4 hours' worth of feedings in the feeding bag. ? Stop the feedings when you need to give medicine or flush the tube. Be sure to restart the feedings. ? Make sure  the person's head is above his or her stomach (upright position). This will prevent choking and discomfort.  Make sure the person is in the right position during and after feedings. ? During feedings, have the person in the upright position. ? After a non-continuous feeding (bolus feeding), have the person stay in the upright position for 1 hour.  Cover and place unused feedings in the refrigerator.  Replace feeding bags and syringes as told. Good hygiene  Make sure the person takes good care of his or her mouth and teeth (oral hygiene), such as by brushing his or her teeth.  Keep the  area where the tube enters the skin clean and dry. General instructions  Use syringes made only for G-tubes.  Do not pull or put tension on the tube.  Before you remove the tube cap or disconnect a syringe, close the tube by using a clamp (clamping) or bending (kinking) the tube.  Measure the length of the G-tube every day from the insertion site to the end of the tube.  If the person's G-tube has a balloon, check the fluid in the balloon every week. Check the manufacturer's specifications to find the amount of fluid that should be in the balloon.  Remove excess air from the G-tube as told. This is called venting.  Do not push feedings, medicines, or flushes fast. Contact a health care provider if:  The person with the tube has constipation or a fever.  A large amount of fluid or mucus-like liquid is leaking from the tube.  Skin or scar tissue appears to be growing where the tube enters the skin.  The length of tube from the insertion site to the G-tube gets longer. Get help right away if:  The person with the tube has any of these problems: ? Severe pain, tenderness, or bloating in the abdomen. ? Nausea or vomiting. ? Trouble breathing or shortness of breath.  Any of these problems happen in the area where the tube enters the skin: ? Redness, irritation, swelling, or soreness. ? Pus-like discharge. ? A bad smell.  The tube is clogged and cannot be flushed.  The tube comes out. The tube will need to put back in within 4 hours. Summary  A gastrostomy tube, or G-tube, is a tube that is inserted through the abdomen into the stomach. The tube is used to give feedings and medicines when a person cannot eat and drink enough on his or her own or cannot take medicine by mouth.  Check and clean the insertion site daily as told by the person's health care provider.  Flush the G-tube regularly to keep it from clogging. Flush it before and after feedings and as often as  told.  Keep the area where the tube enters the skin clean and dry. This information is not intended to replace advice given to you by your health care provider. Make sure you discuss any questions you have with your health care provider. Document Revised: 01/16/2020 Document Reviewed: 01/19/2020 Elsevier Patient Education  2021 [place] Insertion, Care After This sheet gives you information about how to care for yourself after your procedure. Your health care provider may also give you more specific instructions. If you have problems or questions, contact your health care provider. What can I expect after the procedure? After the procedure, it is common to have:  Discomfort at the port insertion site.  Bruising on the skin over the port. This should improve over 3-4 days. Follow  these instructions at home: [facility] care  After your port is placed, you will get a manufacturer's information card. The card has information about your port. Keep this card with you at all times.  Take care of the port as told by your health care provider. Ask your health care provider if you or a family member can get training for taking care of the port at home. A home health care nurse may also take care of the port.  Make sure to remember what type of port you have. Incision care  Follow instructions from your health care provider about how to take care of your port insertion site. Make sure you: ? Wash your hands with soap and water before and after you change your bandage (dressing). If soap and water are not available, use hand sanitizer. ? Change your dressing as told by your health care provider. ? Leave stitches (sutures), skin glue, or adhesive strips in place. These skin closures may need to stay in place for 2 weeks or longer. If adhesive strip edges start to loosen and curl up, you may trim the loose edges. Do not remove adhesive strips completely unless your health care provider  tells you to do that.  Check your port insertion site every day for signs of infection. Check for: ? Redness, swelling, or pain. ? Fluid or blood. ? Warmth. ? Pus or a bad smell.      Activity  Return to your normal activities as told by your health care provider. Ask your health care provider what activities are safe for you.  Do not lift anything that is heavier than 10 lb (4.5 kg), or the limit that you are told, until your health care provider says that it is safe. General instructions  Take over-the-counter and prescription medicines only as told by your health care provider.  Do not take baths, swim, or use a hot tub until your health care provider approves. Ask your health care provider if you may take showers. You may only be allowed to take sponge baths.  Do not drive for 24 hours if you were given a sedative during your procedure.  Wear a medical alert bracelet in case of an emergency. This will tell any health care providers that you have a port.  Keep all follow-up visits as told by your health care provider. This is important. Contact a health care provider if:  You cannot flush your port with saline as directed, or you cannot draw blood from the port.  You have a fever or chills.  You have redness, swelling, or pain around your port insertion site.  You have fluid or blood coming from your port insertion site.  Your port insertion site feels warm to the touch.  You have pus or a bad smell coming from the port insertion site. Get help right away if:  You have chest pain or shortness of breath.  You have bleeding from your port that you cannot control. Summary  Take care of the port as told by your health care provider. Keep the manufacturer's information card with you at all times.  Change your dressing as told by your health care provider.  Contact a health care provider if you have a fever or chills or if you have redness, swelling, or pain around your  port insertion site.  Keep all follow-up visits as told by your health care provider. This information is not intended to replace advice given to you by your health care  provider. Make sure you discuss any questions you have with your health care provider. Document Revised: 03/30/2018 Document Reviewed: 03/30/2018 Elsevier Patient Education  2021 [place].  Moderate Conscious Sedation, Adult, Care After This sheet gives you information about how to care for yourself after your procedure. Your health care provider may also give you more specific instructions. If you have problems or questions, contact your health care provider. What can I expect after the procedure? After the procedure, it is common to have:  Sleepiness for several hours.  Impaired judgment for several hours.  Difficulty with balance.  Vomiting if you eat too soon. Follow these instructions at home: For the time period you were told by your health care provider:  Rest.  Do not participate in activities where you could fall or become injured.  Do not drive or use machinery.  Do not drink alcohol.  Do not take sleeping pills or medicines that cause drowsiness.  Do not make important decisions or sign legal documents.  Do not take care of children on your own.      Eating and drinking  Follow the diet recommended by your health care provider.  Drink enough fluid to keep your urine pale yellow.  If you vomit: ? Drink water, juice, or soup when you can drink without vomiting. ? Make sure you have little or no nausea before eating solid foods.   General instructions  Take over-the-counter and prescription medicines only as told by your health care provider.  Have a responsible adult stay with you for the time you are told. It is important to have someone help care for you until you are awake and alert.  Do not smoke.  Keep all follow-up visits as told by your health care provider. This is  important. Contact a health care provider if:  You are still sleepy or having trouble with balance after 24 hours.  You feel light-headed.  You keep feeling nauseous or you keep vomiting.  You develop a rash.  You have a fever.  You have redness or swelling around the IV site. Get help right away if:  You have trouble breathing.  You have new-onset confusion at home. Summary  After the procedure, it is common to feel sleepy, have impaired judgment, or feel nauseous if you eat too soon.  Rest after you get home. Know the things you should not do after the procedure.  Follow the diet recommended by your health care provider and drink enough fluid to keep your urine pale yellow.  Get help right away if you have trouble breathing or new-onset confusion at home. This information is not intended to replace advice given to you by your health care provider. Make sure you discuss any questions you have with your health care provider. Document Revised: 12/30/2019 Document Reviewed: 07/28/2019 Elsevier Patient Education  2021 [employer].

## 2020-09-24 NOTE — Sedation Documentation (Signed)
Attempt made x 1 to call report to Short Stay. No available RN's at this time. Short Stay to call back when RN is available. Will call back in 5 minutes.

## 2020-09-24 NOTE — H&P (Signed)
Chief Complaint: Patient was seen in consultation today for [facility] a cath placement and percutaneous gastric tube placement at the request of [facility]  Referring Physician(s): Iruku,Praveena  Supervising Physician: [name]  Patient Status: [facility] - Out-pt  History of Present Illness: [name] is a 70 y.o. male   Recurrent laryngeal cancer Dx Aug 2020-- underwent radiotherapy-- but did not want surgery. Recurrence Oct 2021---did undergo radical neck dissection PET revealing recurrence Dec 2021.  Now with dysphagia; wt loss Probable malnutrition To start chemotherapy soon  Scheduled for percutaneous gastric tube placement and Port a cath placement today in IR per Dr Chryl Heck Documentation in chart-- pt has had education for G tube    Past Medical History:  Diagnosis Date  . Cancer ([place])    Throat cancer 2019  . ETOH abuse   . Frequent urination   . Glaucoma   . Hepatitis C virus infection without hepatic coma    dx'ed in 11/2018  . History of radiation therapy 05/12/19- 07/06/19   Larynx  . Hypertension   . Wears denture    upper only; lost lower denture    Past Surgical History:  Procedure Laterality Date  . ANKLE SURGERY  2011   right ankle  . COLONOSCOPY  02/2019   polyps - Dr Havery Moros  . DIRECT LARYNGOSCOPY N/A 04/01/2019   Procedure: DIRECT LARYNGOSCOPY WITH BIOPSY;  Surgeon: Jodi Marble, MD;  Location: [place];  Service: ENT;  Laterality: N/A;  . DIRECT LARYNGOSCOPY N/A 01/06/2020   Procedure: DIRECT LARYNGOSCOPY;  Surgeon: Izora Gala, MD;  Location: [place];  Service: ENT;  Laterality: N/A;  . DIRECT LARYNGOSCOPY N/A 08/13/2020   Procedure: DIRECT LARYNGOSCOPY;  Surgeon: Izora Gala, MD;  Location: [place];  Service: ENT;  Laterality: N/A;  . ESOPHAGOSCOPY N/A 04/01/2019   Procedure: ESOPHAGOSCOPY;  Surgeon: Jodi Marble, MD;  Location: [place];  Service: ENT;  Laterality: N/A;  . EXCISION ORAL TUMOR Right 08/13/2020    Procedure: BIOPSY OF OROPHARYNGEAL MASS;  Surgeon: Izora Gala, MD;  Location: [place];  Service: ENT;  Laterality: Right;  . EYE SURGERY Right   . KNEE SURGERY    . LARYNGOSCOPY AND BRONCHOSCOPY N/A 04/01/2019   Procedure: BRONCHOSCOPY;  Surgeon: Jodi Marble, MD;  Location: [place];  Service: ENT;  Laterality: N/A;  . RADICAL NECK DISSECTION N/A 01/06/2020   Procedure: RADICAL NECK DISSECTION;  Surgeon: Izora Gala, MD;  Location: [place];  Service: ENT;  Laterality: N/A;    Allergies: Patient has no known allergies.  Medications: Prior to Admission medications   Medication Sig Start Date End Date Taking? Authorizing Provider  atropine 1 % ophthalmic solution Place 1 drop into the right eye 2 (two) times a day.    Yes [provider]  cetirizine (ZYRTEC) 10 MG tablet Take 10 mg by mouth daily as needed for allergies.   Yes [provider]  dexamethasone (DECADRON) 4 MG tablet Take 2 tablets (8 mg total) by mouth daily. Take daily x 3 days starting the day after cisplatin chemotherapy. Take with food. 09/19/20  Yes [name], MD  HYDROcodone-acetaminophen (HYCET) 7.5-325 mg/15 ml solution Take 15 mLs by mouth 4 (four) times daily as needed for up to 7 days for moderate pain. 09/21/20 09/28/20 Yes [name], MD  ibuprofen (ADVIL) 200 MG tablet Take 400 mg by mouth every 6 (six) hours as needed for headache or moderate pain.   Yes [provider]  latanoprost Ivin Poot)  0.005 % ophthalmic solution Place 1 drop into the right eye at bedtime.    Yes [provider]  lidocaine (XYLOCAINE) 2 % solution Patient: Mix 1part 2% viscous lidocaine, 1part H20. Swish AND swallow 29mL of diluted mixture, 44min before meals and at bedtime, up to QID Patient taking differently: Use as directed in the mouth or throat See admin instructions. Patient: Mix 1part 2% viscous lidocaine, 1part H20. Swish AND swallow 41mL of diluted mixture, 34min before meals  and at bedtime, up to QID 08/31/20  Yes [name], MD  lidocaine-prilocaine (EMLA) cream Apply to affected area once Patient taking differently: Apply 1 application topically daily as needed (port access). Apply to affected area once 09/19/20  Yes Iruku, Arletha Pili, MD  LORazepam (ATIVAN) 0.5 MG tablet Take 1 tablet (0.5 mg total) by mouth every 6 (six) hours as needed (Nausea or vomiting). 09/19/20  Yes [name], MD  losartan (COZAAR) 50 MG tablet Take 50 mg by mouth daily. 02/24/20  Yes [provider]  ondansetron (ZOFRAN) 8 MG tablet Take 1 tablet (8 mg total) by mouth 2 (two) times daily as needed. Start on the third day after cisplatin chemotherapy. 09/19/20  Yes [name], MD  prednisoLONE acetate (PRED FORTE) 1 % ophthalmic suspension Place 1 drop into both eyes daily. 07/04/20  Yes [provider]  prochlorperazine (COMPAZINE) 10 MG tablet Take 1 tablet (10 mg total) by mouth every 6 (six) hours as needed (Nausea or vomiting). 09/19/20  Yes [name], MD  tolterodine (DETROL LA) 4 MG 24 hr capsule Take 4 mg by mouth daily. 01/16/19  Yes [provider]  Vitamin D, Ergocalciferol, (DRISDOL) 1.25 MG (50000 UNIT) CAPS capsule Take 50,000 Units by mouth once a week. 05/22/20  Yes [provider]     Family History  Problem Relation Age of Onset  . Breast cancer Sister   . Colon cancer Brother 28       ????  . Cancer Brother     Social History   Socioeconomic History  . Marital status: Single    Spouse name: Not on file  . Number of children: 2  . Years of education: Not on file  . Highest education level: Not on file  Occupational History  . Not on file  Tobacco Use  . Smoking status: Former Smoker    Years: 50.00    Types: Cigarettes    Quit date: 06/01/2020    Years since quitting: 0.3  . Smokeless tobacco: Never Used  Vaping Use  . Vaping Use: Never used  Substance and Sexual Activity  . Alcohol use: Yes    Alcohol/week: 4.0  standard drinks    Types: 4 Cans of beer per week    Comment: 40oz beer daily  . Drug use: Yes    Types: Cocaine    Comment: none in 2 yrs  . Sexual activity: Yes    Partners: Female  Other Topics Concern  . Not on file  Social History Narrative   Patient is divorced with 2 children.   Patient is currently living with his sister.   Patient with a history of smoking a third of pack of cigarettes daily for 50 years.  Patient currently smoking 2 to 3 cigarettes/day.   Patient has never used smokeless tobacco.   Patient with occasional use of alcohol.   Patient last used cocaine approximately 6 months ago.  Patient denies use of marijuana.   Social Determinants of Health   Financial  Resource Strain: Not on file  Food Insecurity: No Food Insecurity  . Worried About Charity fundraiser in the Last Year: Never true  . Ran Out of Food in the Last Year: Never true  Transportation Needs: No Transportation Needs  . Lack of Transportation (Medical): No  . Lack of Transportation (Non-Medical): No  Physical Activity: Not on file  Stress: Not on file  Social Connections: Unknown  . Frequency of Communication with Friends and Family: Three times a week  . Frequency of Social Gatherings with Friends and Family: More than three times a week  . Attends Religious Services: Not on file  . Active Member of Clubs or Organizations: Not on file  . Attends Archivist Meetings: Not on file  . Marital Status: Not on file     Review of Systems: A 12 point ROS discussed and pertinent positives are indicated in the HPI above.  All other systems are negative.  Review of Systems  Constitutional: Positive for activity change, appetite change and unexpected weight change. Negative for fever.  HENT: Positive for sore throat and trouble swallowing.   Respiratory: Negative for cough and shortness of breath.   Gastrointestinal: Negative for abdominal pain and nausea.  Neurological: Positive for  weakness.  Psychiatric/Behavioral: Negative for behavioral problems and confusion.    Vital Signs: BP (!) 149/99   Pulse 84   Temp 98.2 F (36.8 C) (Oral)   Resp 16   Ht 5\' 8"  (1.727 m)   Wt 130 lb (59 kg)   SpO2 98%   BMI 19.77 kg/m   Physical Exam Vitals reviewed.  HENT:     Mouth/Throat:     Mouth: Mucous membranes are moist.  Cardiovascular:     Rate and Rhythm: Normal rate and regular rhythm.     Heart sounds: Normal heart sounds.  Pulmonary:     Effort: Pulmonary effort is normal.     Breath sounds: Rhonchi present.  Abdominal:     Palpations: Abdomen is soft.     Tenderness: There is no abdominal tenderness.  Musculoskeletal:        General: Normal range of motion.  Skin:    General: Skin is warm.  Neurological:     Mental Status: He is alert and oriented to person, place, and time.  Psychiatric:        Behavior: Behavior normal.     Imaging: NM PET Image Restag (PS) Skull Base To Thigh  Result Date: 08/31/2020 CLINICAL DATA:  Subsequent treatment strategy for head neck carcinoma staging. Laryngeal carcinoma diagnosis July 2020. Patient undergone surgery and radiation treatment. Recurrence RIGHT level 2 lymph node recurrence followed modified RIGHT neck dissection revealing squamous cell carcinoma for 19 days EXAM: NUCLEAR MEDICINE PET SKULL BASE TO THIGH TECHNIQUE: 7.3 mCi F-18 FDG was injected intravenously. Full-ring PET imaging was performed from the skull base to thigh after the radiotracer. CT data was obtained and used for attenuation correction and anatomic localization. Fasting blood glucose: 90 mg/dl COMPARISON:  Neck CT 07/05/2020, PET-CT 10/27/2019. FINDINGS: Mediastinal blood pool activity: SUV max 2.0 Liver activity: SUV max NA NECK: Intense metabolic activity localizing to the RIGHT lateral hypopharynx with SUV max equal 10.6. This hypermetabolic activity corresponds to abnormal asymmetric thickening on comparison CT. The metabolic activity appears  confined to the pharyngeal mucosa. The activity spans a large surface area from the level of the palatine and lingual tonsils inferiorly to the level of the vallecula. Hypermetabolic lesion posterior to the margin  of the RIGHT sternocleidomastoid muscle with SUV max equal 3.8 (image 242). This tissue is poorly defined by CT imaging. On comparison contrast CT there is a enhancing nodule at this location (image 61/series 3/CT 07/05/2020). No contralateral hypermetabolic nodes. There is hypermetabolic lesion within the LEFT parotid gland measuring 5 mm with SUV max equal 3.5 on image 264 of the fused data set. 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 Incidental CT findings: none CHEST: No hypermetabolic mediastinal or hilar nodes. No suspicious pulmonary nodules on the CT scan. Incidental CT findings: none ABDOMEN/PELVIS: No abnormal hypermetabolic activity within the liver, pancreas, adrenal glands, or spleen. No hypermetabolic lymph nodes in the abdomen or pelvis. Incidental CT findings: none SKELETON: No focal hypermetabolic activity to suggest skeletal metastasis. Incidental CT findings: none IMPRESSION: 1. Recurrence of head neck carcinoma with a broad hypermetabolic pharyngeal mucosal lesion involving the RIGHT lateral posterior oropharynx and hypopharynx extending from the palatine tonsil to the vallecula. 2. Hypermetabolic lymph node posterior sternocleidomastoid muscle on the RIGHT (level 3). 3. Hypermetabolic nodule within the LEFT parotid glands favored small primary parotid neoplasm. 4. No evidence thoracic metastasis. Electronically Signed   By: Suzy Bouchard M.D.   On: 08/31/2020 11:58    Labs:  CBC: Recent Labs    11/10/19 0940 01/03/20 1355 09/24/20 0737  WBC 4.8 3.6* 6.3  HGB 15.2 12.8* 13.8  HCT 46.0 39.1 42.7  PLT 151 193 237    COAGS: Recent Labs    01/03/20 1355  INR 1.0    BMP: Recent Labs    10/26/19 1523  11/10/19 0940 01/03/20 1355 08/30/20 1351  NA 141 140 142  --   K 4.0 4.1 3.4*  --   CL 105 103 105  --   CO2 30 27 29   --   GLUCOSE 114* 120* 93  --   BUN 9 6* 12 9  CALCIUM 9.5 9.4 9.0  --   CREATININE 0.61* 0.69 0.71 0.56*  GFRNONAA  --  >60 >60 >60  GFRAA  --  >60 >60  --     LIVER FUNCTION TESTS: Recent Labs    10/26/19 1523 11/10/19 0940 01/03/20 1355  BILITOT 0.5 0.7 0.5  AST 19 25 17   ALT 16 19 14   ALKPHOS  --  41 41  PROT 7.3 7.7 7.2  ALBUMIN  --  3.7 3.6    TUMOR MARKERS: No results for input(s): AFPTM, CEA, CA199, CHROMGRNA in the last 8760 hours.  Assessment and Plan:  Head Neck cancer Dysphagia Wt loss Malnutrition To start chemo therapy soon  Pt is scheduled for Port a cath and Percutaneous gastric tube placement in IR today Risks and benefits image guided gastrostomy tube placement was discussed with the patient including, but not limited to the need for a barium enema during the procedure, bleeding, infection, peritonitis and/or damage to adjacent structures. All of the patient's questions were answered, patient is agreeable to proceed.  Consent signed and in chart. Risks and benefits of image guided port-a-catheter placement was discussed with the patient including, but not limited to bleeding, infection, pneumothorax, or fibrin sheath development and need for additional procedures. All of the patient's questions were answered, patient is agreeable to proceed. Consent signed and in chart.   Thank you for this interesting consult.  I greatly enjoyed meeting [name] and look forward to participating in their care.  A copy of this report was sent to the requesting provider  on this date.  Electronically Signed: Lavonia Drafts, PA-C 09/24/2020, 8:07 AM   I spent a total of  30 Minutes   in face to face in clinical consultation, greater than 50% of which was counseling/coordinating care for Perc G tube and PAC placement

## 2020-09-24 NOTE — Procedures (Signed)
Interventional Radiology Procedure:   Indications: Head and neck cancer, recurrence  Procedure: Port placement and gastrostomy tube placement  Findings: Left jugular port, tip at SVC/RA junction. 20 Fr tube in stomach  Complications: None     EBL: Minimal, less than 10 ml  Plan: Discharge in two hours.  Keep port site and incisions dry for at least 24 hours.   Keep NPO for 4 hours then may switch to clear liquids tonight.  Regular diet tomorrow.  May use gastrostomy tube tomorrow.    Verdie Wilms R. Anselm Pancoast, MD  Pager: [phone]

## 2020-09-25 ENCOUNTER — Inpatient Hospital Stay: Payer: Medicare HMO

## 2020-09-25 ENCOUNTER — Other Ambulatory Visit: Payer: Self-pay

## 2020-09-25 ENCOUNTER — Inpatient Hospital Stay (HOSPITAL_BASED_OUTPATIENT_CLINIC_OR_DEPARTMENT_OTHER): Payer: Medicare HMO | Admitting: Hematology and Oncology

## 2020-09-25 ENCOUNTER — Ambulatory Visit
Admission: RE | Admit: 2020-09-25 | Discharge: 2020-09-25 | Disposition: A | Discharge status: Home / Self Care | Payer: Medicare HMO | Source: Ambulatory Visit | Attending: Radiation Oncology | Admitting: Radiation Oncology

## 2020-09-25 ENCOUNTER — Encounter: Payer: Self-pay | Admitting: Hematology and Oncology

## 2020-09-25 VITALS — BP 127/95 | HR 98 | Temp 97.8°F | Resp 20 | Ht 68.0 in | Wt 128.8 lb

## 2020-09-25 DIAGNOSIS — Z931 Gastrostomy status: Secondary | ICD-10-CM | POA: Diagnosis not present

## 2020-09-25 DIAGNOSIS — Z87891 Personal history of nicotine dependence: Secondary | ICD-10-CM | POA: Diagnosis not present

## 2020-09-25 DIAGNOSIS — C090 Malignant neoplasm of tonsillar fossa: Secondary | ICD-10-CM

## 2020-09-25 DIAGNOSIS — C329 Malignant neoplasm of larynx, unspecified: Secondary | ICD-10-CM | POA: Diagnosis present

## 2020-09-25 DIAGNOSIS — Z5111 Encounter for antineoplastic chemotherapy: Secondary | ICD-10-CM | POA: Diagnosis present

## 2020-09-25 DIAGNOSIS — Z923 Personal history of irradiation: Secondary | ICD-10-CM | POA: Diagnosis not present

## 2020-09-25 LAB — CBC WITH DIFFERENTIAL/PLATELET
Abs Immature Granulocytes: 0.04 10*3/uL (ref 0.00–0.07)
Basophils Absolute: 0 10*3/uL (ref 0.0–0.1)
Basophils Relative: 0 %
Eosinophils Absolute: 0.1 10*3/uL (ref 0.0–0.5)
Eosinophils Relative: 1 %
HCT: 39.5 % (ref 39.0–52.0)
Hemoglobin: 13.1 g/dL (ref 13.0–17.0)
Immature Granulocytes: 1 %
Lymphocytes Relative: 14 %
Lymphs Abs: 1.1 10*3/uL (ref 0.7–4.0)
MCH: 30.3 pg (ref 26.0–34.0)
MCHC: 33.2 g/dL (ref 30.0–36.0)
MCV: 91.2 fL (ref 80.0–100.0)
Monocytes Absolute: 0.7 10*3/uL (ref 0.1–1.0)
Monocytes Relative: 9 %
Neutro Abs: 5.9 10*3/uL (ref 1.7–7.7)
Neutrophils Relative %: 75 %
Platelets: 213 10*3/uL (ref 150–400)
RBC: 4.33 MIL/uL (ref 4.22–5.81)
RDW: 11.9 % (ref 11.5–15.5)
WBC: 7.9 10*3/uL (ref 4.0–10.5)
nRBC: 0 % (ref 0.0–0.2)

## 2020-09-25 LAB — BASIC METABOLIC PANEL
Anion gap: 14 (ref 5–15)
BUN: 15 mg/dL (ref 8–23)
CO2: 26 mmol/L (ref 22–32)
Calcium: 9.8 mg/dL (ref 8.9–10.3)
Chloride: 98 mmol/L (ref 98–111)
Creatinine, Ser: 0.87 mg/dL (ref 0.61–1.24)
GFR, Estimated: >60 mL/min (ref >60)
Glucose, Bld: 148 mg/dL — ABNORMAL HIGH (ref 70–99)
Potassium: 3.6 mmol/L (ref 3.5–5.1)
Sodium: 138 mmol/L (ref 135–145)

## 2020-09-25 LAB — MAGNESIUM: Magnesium: 1.6 mg/dL — ABNORMAL LOW (ref 1.7–2.4)

## 2020-09-25 MED ORDER — SODIUM CHLORIDE 0.9 % IV SOLN
Freq: Once | INTRAVENOUS | Status: AC
  Filled 2020-09-25: qty 250

## 2020-09-25 MED ORDER — SODIUM CHLORIDE 0.9 % IV SOLN: Freq: Once | INTRAVENOUS | Status: DC

## 2020-09-25 MED ORDER — PALONOSETRON HCL INJECTION 0.25 MG/5ML
0.2500 mg | Freq: Once | INTRAVENOUS | Status: AC
  Administered 2020-09-25: 0.25 mg via INTRAVENOUS

## 2020-09-25 MED ORDER — HEPARIN SOD (PORK) LOCK FLUSH 100 UNIT/ML IV SOLN
500.0000 [IU] | Freq: Once | INTRAVENOUS | Status: AC | PRN
  Administered 2020-09-25: 500 [IU]
  Filled 2020-09-25: qty 5

## 2020-09-25 MED ORDER — PALONOSETRON HCL INJECTION 0.25 MG/5ML
INTRAVENOUS | Status: AC
  Filled 2020-09-25: qty 5

## 2020-09-25 MED ORDER — SODIUM CHLORIDE 0.9 % IV SOLN
150.0000 mg | Freq: Once | INTRAVENOUS | Status: AC
  Administered 2020-09-25: 150 mg via INTRAVENOUS
  Filled 2020-09-25: qty 150

## 2020-09-25 MED ORDER — MAGNESIUM SULFATE 2 GM/50ML IV SOLN
2.0000 g | Freq: Once | INTRAVENOUS | Status: AC
  Administered 2020-09-25: 2 g via INTRAVENOUS

## 2020-09-25 MED ORDER — MAGNESIUM SULFATE 2 GM/50ML IV SOLN
INTRAVENOUS | Status: AC
  Filled 2020-09-25: qty 50

## 2020-09-25 MED ORDER — SONAFINE EX EMUL
1.0000 "application " | Freq: Two times a day (BID) | CUTANEOUS | Status: DC
  Administered 2020-09-25: 1 via TOPICAL

## 2020-09-25 MED ORDER — SODIUM CHLORIDE 0.9 % IV SOLN
10.0000 mg | Freq: Once | INTRAVENOUS | Status: AC
  Administered 2020-09-25: 10 mg via INTRAVENOUS
  Filled 2020-09-25: qty 10

## 2020-09-25 MED ORDER — POTASSIUM CHLORIDE IN NACL 20-0.9 MEQ/L-% IV SOLN
Freq: Once | INTRAVENOUS | Status: AC
  Filled 2020-09-25: qty 1000

## 2020-09-25 MED ORDER — SODIUM CHLORIDE 0.9 % IV SOLN
40.0000 mg/m2 | Freq: Once | INTRAVENOUS | Status: AC
  Administered 2020-09-25: 68 mg via INTRAVENOUS
  Filled 2020-09-25: qty 68

## 2020-09-25 MED ORDER — DOCUSATE SODIUM 50 MG PO CAPS: 50.0000 mg | ORAL_CAPSULE | Freq: Two times a day (BID) | ORAL | 0 refills | Status: DC

## 2020-09-25 MED ORDER — SODIUM CHLORIDE 0.9% FLUSH
10.0000 mL | INTRAVENOUS | Status: DC | PRN
  Administered 2020-09-25: 10 mL
  Filled 2020-09-25: qty 10

## 2020-09-25 NOTE — Progress Notes (Signed)
Pt here for patient teaching.    Pt given Radiation and You booklet, Managing Acute Radiation Side Effects for Head and Neck Cancer handout, skin care instructions and Sonafine.    Reviewed areas of pertinence such as fatigue, hair loss, mouth changes, skin changes, throat changes, earaches and taste changes .   Pt able to give teach back of to pat skin, use unscented/gentle soap and drink plenty of water,apply Sonafine bid, avoid applying anything to skin within 4 hours of treatment and to use an electric razor if they must shave.   Pt demonstrated understanding, needs reinforcement and verbalizes understanding of information given and will contact nursing with any questions or concerns.    [email or web address]

## 2020-09-25 NOTE — Progress Notes (Signed)
[facility] NOTE  Patient Care Team: [name], MD as PCP - General (Internal Medicine) Izora Gala, MD as Consulting Physician (Otolaryngology) [name], MD as Attending Physician (Radiation Oncology) Leota Sauers, RN (Inactive) as Oncology Nurse Navigator Karie Mainland, RD as Dietitian (Nutrition) Valentino Saxon [place], CCC-SLP as Speech Language Pathologist (Speech Pathology) Kennith Center, LCSW as Social Worker  CHIEF COMPLAINTS/PURPOSE OF CONSULTATION:  Recurrent SCC of larynx  ASSESSMENT & PLAN:   No problem-specific Assessment & Plan notes found for this encounter.  No orders of the defined types were placed in this encounter.  This is a very pleasant 70 year old male patient with history of recurrent laryngeal cancer now with a right oropharyngeal mass referred to medical oncology for further recommendations.  Please refer to the below mentioned history.   He was first diagnosed with T2N0 laryngeal cancer back in August 2020 and underwent definitive radiotherapy since he refused surgical treatment.  He then had a recurrence in Oct 2021 and had modified radical neck dissection, now presented with soft tissue thickening and enhancement along the right lateral pharynx. PET showed recurrence of head and neck cancer with broad hypermetabolic pharyngeal mucosal lesion involving right lateral posterior oropharynx and hypopharynx extending from palatine tonsil to the vallecula.Hypermetabolic lymph node posterior sternocleidomastoid muscle on the RIGHT (level 3).Hypermetabolic nodule within the LEFT parotid glands favored small primary parotid neoplasm. He saw Dr. Conley Canal from [facility], and recommendation is to proceed with chemo radiation first and consider salvage surgery if incomplete response. I discussed that this is a reasonable approach given his recurrence. I discussed about side effects from cisplatin including but not limited to fatigue,  nausea, vomiting, increased risk of infections, ototoxicity, nephrotoxicity and clearly explained that some of the side effects can be permanent.  I have encouraged considering a PEG tube and a Port-A-Cath for chemotherapy administration. He is agreeable and this has been ordered.  He understands that there is no guarantee for cure despite what ever modality of treatment we choose. He had his PORT and PEG placed yesterday and complains of some soreness around the PEG tube. PE today, palpable right neck mass without , port site appears clean, PEG site with dressing hence not examined. Lung exam consistent with COPD.  OK to proceed with chemo, labs reviewed and within parameters  2. Pain control, ok to use liquid hydrocodone as needed for cancer and treatment related pain.  3. Constipation, advised using docusate daily and miralax as needed. Advised to start with docusate and see if this relieves constipation. He was encouraged to drink more water.  All his questions were answered to the best of my knowledge.   Thank you for consulting [place] in the care of this patient.  HISTORY OF PRESENTING ILLNESS:   [name] 70 y.o. male is here because of laryngeal cancer  [name] is a 70 y.o. male who has been treated for laryngeal cancer in the past.   Aug 2020, Pt had presented with dysphagia and difficulty breathing through his mouth, 20 lb weight loss. He saw Dr. Erik Obey who performed laryngoscopy and appreciated a 2 to 3 cm ovoid pedunculated mass arising from the right aryepiglottic fold and impinging on the supraglottic airway.  The vocal cords appeared to be mobile.  Urgent panendoscopy and biopsy were recommended to protect his airway.  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 According to his op note,a bulky  necrotic and semi-pedunculated tumor coming off the lateral surface of the right  aryepiglottic fold and the anterior and lateral aspect of the piriform sinus on the right side. Airway was compromised by the tumor mass at intubation.  CT scan of the neck on 04/11/2019 showed glottic closure with no asymmetry of the cords, correlate with laryngoscopy results.  Questionable right level 2 lymph node measured 7 mm.  No definite pathologic lymph nodes in the neck.  PET scan on 04/22/2019 revealed mild residual activity in the posterior right hypopharynx confined to the mucosa with no evidence of metastatic disease to the neck or distantly.  He underwent definitive radiotherapy to T2N0 laryngeal cancer  since he refused surgical treatment.  He then had PET imaging which showed a right neck lymph node, suspicious on PET. Biopsy of right neck lymph node is positive for recurrence. He underwent right modified radical neck dissection including levels 2 and 3, including the internal jugular vein, suture marks the inferior jugular vein stump.  He most recently underwent a soft tissue neck CT scan on the date of 07/05/2020 that revealed asymmetric soft tissue thickening and enhancement along the right lateral pharynx. It also showed suspicious small 6 mm hyperenhancing nodular soft tissue along the posterior margin of the neck dissect at right level 3. Additionally, there was a mild inflammatory appearing right upper lobe centrilobular ground-glass opacity that was new (consider mild or developing upper lobe infection), post radiation changes to the lung apices, and sequelae of radiation and right neck dissection from previous neck CT.  Subsequently, the patient underwent a direct laryngoscopy and biopsy of oropharyngeal mass on the date of 08/13/2020. Pathology from the procedure revealed poorly differentiated squamous cell carcinoma.  PET showed recurrence of head and neck cancer with broad hypermetabolic pharyngeal mucosal lesion involving right lateral posterior oropharynx and hypopharynx  extending from palatine tonsil to the vallecula.Hypermetabolic lymph node posterior sternocleidomastoid muscle on the RIGHT (level 3).Hypermetabolic nodule within the LEFT parotid glands favored small primary parotid neoplasm.  Recommendation was to consider proceeding with upfront concurrent chemo radiation followed by salvage surgery if needed. Today, he complains of some ongoing pain in the right side of the neck radiating to the ear but otherwise no complaints. He is able to swallow all liquids, may have lost a couple lbs. Some soreness around PEG tube.  No baseline hearing loss. No other complaints for me. He smoked about 1 ppweek, used to drink 40 oz beer daily, occasionally hard liquor. He is a Curator by occupation.  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 Ears, nose, mouth, throat, and face: as mentioned.Respiratory: Denies cough, dyspnea or wheezes Cardiovascular: Denies palpitation, chest discomfort or lower extremity swelling Gastrointestinal:  Denies nausea, heartburn or change in bowel habits Skin: Denies abnormal skin rashes Lymphatics: Denies new lymphadenopathy or easy bruising Neurological:Denies numbness, tingling or new weaknesses Behavioral/Psych: Mood is stable, no new changes  All other systems were reviewed with the patient and are negative.  MEDICAL HISTORY:  Past Medical History:  Diagnosis Date  . Cancer ([place])    Throat cancer 2019  . ETOH abuse   . Frequent urination   . Glaucoma   . Hepatitis C virus infection without hepatic coma    dx'ed in 11/2018  . History of radiation therapy 05/12/19- 07/06/19   Larynx  . Hypertension   . Wears denture    upper only; lost lower denture  SURGICAL HISTORY: Past Surgical History:  Procedure Laterality Date  . ANKLE SURGERY  2011   right ankle  . COLONOSCOPY  02/2019   polyps - Dr Havery Moros  . DIRECT LARYNGOSCOPY N/A 04/01/2019    Procedure: DIRECT LARYNGOSCOPY WITH BIOPSY;  Surgeon: Jodi Marble, MD;  Location: [facility];  Service: ENT;  Laterality: N/A;  . DIRECT LARYNGOSCOPY N/A 01/06/2020   Procedure: DIRECT LARYNGOSCOPY;  Surgeon: Izora Gala, MD;  Location: [facility];  Service: ENT;  Laterality: N/A;  . DIRECT LARYNGOSCOPY N/A 08/13/2020   Procedure: DIRECT LARYNGOSCOPY;  Surgeon: Izora Gala, MD;  Location: [facility];  Service: ENT;  Laterality: N/A;  . ESOPHAGOSCOPY N/A 04/01/2019   Procedure: ESOPHAGOSCOPY;  Surgeon: Jodi Marble, MD;  Location: [facility];  Service: ENT;  Laterality: N/A;  . EXCISION ORAL TUMOR Right 08/13/2020   Procedure: BIOPSY OF OROPHARYNGEAL MASS;  Surgeon: Izora Gala, MD;  Location: [facility];  Service: ENT;  Laterality: Right;  . EYE SURGERY Right   . IR GASTROSTOMY TUBE MOD SED  09/24/2020  . IR IMAGING GUIDED PORT INSERTION  09/24/2020  . KNEE SURGERY    . LARYNGOSCOPY AND BRONCHOSCOPY N/A 04/01/2019   Procedure: BRONCHOSCOPY;  Surgeon: Jodi Marble, MD;  Location: [facility];  Service: ENT;  Laterality: N/A;  . RADICAL NECK DISSECTION N/A 01/06/2020   Procedure: RADICAL NECK DISSECTION;  Surgeon: Izora Gala, MD;  Location: [facility] OR;  Service: ENT;  Laterality: N/A;    SOCIAL HISTORY: Social History   Socioeconomic History  . Marital status: Single    Spouse name: Not on file  . Number of children: 2  . Years of education: Not on file  . Highest education level: Not on file  Occupational History  . Not on file  Tobacco Use  . Smoking status: Former Smoker    Years: 50.00    Types: Cigarettes    Quit date: 06/01/2020    Years since quitting: 0.3  . Smokeless tobacco: Never Used  Vaping Use  . Vaping Use: Never used  Substance and Sexual Activity  . Alcohol use: Yes    Alcohol/week: 4.0 standard drinks    Types: 4 Cans of beer per week    Comment: 40oz beer daily  . Drug use: Yes    Types: Cocaine    Comment: none in 2 yrs  . Sexual activity: Yes     Partners: Female  Other Topics Concern  . Not on file  Social History Narrative   Patient is divorced with 2 children.   Patient is currently living with his sister.   Patient with a history of smoking a third of pack of cigarettes daily for 50 years.  Patient currently smoking 2 to 3 cigarettes/day.   Patient has never used smokeless tobacco.   Patient with occasional use of alcohol.   Patient last used cocaine approximately 6 months ago.  Patient denies use of marijuana.   Social Determinants of Health   Financial Resource Strain: Not on file  Food Insecurity: No Food Insecurity  . Worried About Charity fundraiser in the Last Year: Never true  . Ran Out of Food in the Last Year: Never true  Transportation Needs: No Transportation Needs  . Lack of Transportation (Medical): No  . Lack of Transportation (Non-Medical): No  Physical Activity: Not on file  Stress: Not on file  Social Connections: Unknown  . Frequency of Communication with Friends and Family: Three times a week  .  Frequency of Social Gatherings with Friends and Family: More than three times a week  . Attends Religious Services: Not on file  . Active Member of Clubs or Organizations: Not on file  . Attends Archivist Meetings: Not on file  . Marital Status: Not on file  Intimate Partner Violence: Not on file    FAMILY HISTORY: Family History  Problem Relation Age of Onset  . Breast cancer Sister   . Colon cancer Brother 67       ????  . Cancer Brother     ALLERGIES:  has No Known Allergies.  MEDICATIONS:  Current Outpatient Medications  Medication Sig Dispense Refill  . atropine 1 % ophthalmic solution Place 1 drop into the right eye 2 (two) times a day.     . cetirizine (ZYRTEC) 10 MG tablet Take 10 mg by mouth daily as needed for allergies.    Marland Kitchen dexamethasone (DECADRON) 4 MG tablet Take 2 tablets (8 mg total) by mouth daily. Take daily x 3 days starting the day after cisplatin chemotherapy.  Take with food. 30 tablet 1  . HYDROcodone-acetaminophen (HYCET) 7.5-325 mg/15 ml solution Take 15 mLs by mouth 4 (four) times daily as needed for up to 7 days for moderate pain. 473 mL 0  . ibuprofen (ADVIL) 200 MG tablet Take 400 mg by mouth every 6 (six) hours as needed for headache or moderate pain.    Marland Kitchen latanoprost (XALATAN) 0.005 % ophthalmic solution Place 1 drop into the right eye at bedtime.     . lidocaine (XYLOCAINE) 2 % solution Patient: Mix 1part 2% viscous lidocaine, 1part H20. Swish AND swallow 10mL of diluted mixture, 16min before meals and at bedtime, up to QID (Patient taking differently: Use as directed in the mouth or throat See admin instructions. Patient: Mix 1part 2% viscous lidocaine, 1part H20. Swish AND swallow 92mL of diluted mixture, 53min before meals and at bedtime, up to QID) 200 mL 3  . lidocaine-prilocaine (EMLA) cream Apply to affected area once (Patient taking differently: Apply 1 application topically daily as needed (port access). Apply to affected area once) 30 g 3  . LORazepam (ATIVAN) 0.5 MG tablet Take 1 tablet (0.5 mg total) by mouth every 6 (six) hours as needed (Nausea or vomiting). 30 tablet 0  . losartan (COZAAR) 50 MG tablet Take 50 mg by mouth daily.    . ondansetron (ZOFRAN) 8 MG tablet Take 1 tablet (8 mg total) by mouth 2 (two) times daily as needed. Start on the third day after cisplatin chemotherapy. 30 tablet 1  . prednisoLONE acetate (PRED FORTE) 1 % ophthalmic suspension Place 1 drop into both eyes daily.    . prochlorperazine (COMPAZINE) 10 MG tablet Take 1 tablet (10 mg total) by mouth every 6 (six) hours as needed (Nausea or vomiting). 30 tablet 1  . tolterodine (DETROL LA) 4 MG 24 hr capsule Take 4 mg by mouth daily.    . Vitamin D, Ergocalciferol, (DRISDOL) 1.25 MG (50000 UNIT) CAPS capsule Take 50,000 Units by mouth once a week.     No current facility-administered medications for this visit.     PHYSICAL EXAMINATION: ECOG PERFORMANCE  STATUS: 1 - Symptomatic but completely ambulatory  Vitals:   09/25/20 0802  BP: (!) 127/95  Pulse: 98  Resp: 20  Temp: 97.8 F (36.6 C)  SpO2: 100%   Filed Weights   09/25/20 0802  Weight: 128 lb 12.8 oz (58.4 kg)    GENERAL:alert, no distress and comfortable  SKIN: skin color, texture, turgor are normal, no rashes or significant lesions EYES: normal, conjunctiva are pink and non-injected, sclera clear NECK: firmness right neck tender to touch LYMPH:  no palpable lymphadenopathy in the cervical, axillary or inguinal LUNGS: scattered rhonchi, HEART: regular rate & rhythm and no murmurs and no lower extremity edema ABDOMEN:abdomen soft, non-tender and normal bowel sounds Musculoskeletal:no cyanosis of digits and no clubbing  PSYCH: alert & oriented x 3 with fluent speech NEURO: no focal motor/sensory deficits  LABORATORY DATA:  I have reviewed the data as listed Lab Results  Component Value Date   WBC 7.9 09/25/2020   HGB 13.1 09/25/2020   HCT 39.5 09/25/2020   MCV 91.2 09/25/2020   PLT 213 09/25/2020     Chemistry      Component Value Date/Time   NA 142 01/03/2020 1355   K 3.4 (L) 01/03/2020 1355   CL 105 01/03/2020 1355   CO2 29 01/03/2020 1355   BUN 9 08/30/2020 1351   CREATININE 0.56 (L) 08/30/2020 1351   CREATININE 0.61 (L) 10/26/2019 1523      Component Value Date/Time   CALCIUM 9.0 01/03/2020 1355   ALKPHOS 41 01/03/2020 1355   AST 17 01/03/2020 1355   AST 60 (H) 05/04/2019 1207   ALT 14 01/03/2020 1355   ALT 53 (H) 09/06/2019 1543   BILITOT 0.5 01/03/2020 1355   BILITOT 0.5 05/04/2019 1207       RADIOGRAPHIC STUDIES: I have personally reviewed the radiological images as listed and agreed with the findings in the report. IR Gastrostomy Tube  Result Date: 09/24/2020 INDICATION: 70 year old with head neck cancer and scheduled for chemotherapy and radiation. Request for Port-A-Cath and gastrostomy tube placement. Port-A-Cath was placed immediately  prior to gastrostomy tube placement. EXAM: PERCUTANEOUS GASTROSTOMY TUBE WITH FLUOROSCOPIC GUIDANCE Physician: Stephan Minister. Anselm Pancoast, MD MEDICATIONS: Moderate sedation ANESTHESIA/SEDATION: Versed 0.5 mg IV; Fentanyl 25 mcg IV Moderate Sedation Time:  13 minutes The patient was continuously monitored during the procedure by the interventional radiology nurse under my direct supervision. FLUOROSCOPY TIME:  Fluoroscopy Time: 2 minutes, 42 seconds, 8 mGy COMPLICATIONS: None immediate. PROCEDURE: The procedure was explained to the patient. The risks and benefits of the procedure were discussed and the patient's questions were addressed. Informed consent was obtained from the patient. The patient was placed on the interventional table. Fluoroscopy demonstrated oral contrast in the transverse colon. An orogastric tube was placed with fluoroscopic guidance. The anterior abdomen was prepped and draped in sterile fashion. Maximal barrier sterile technique was utilized including caps, mask, sterile gowns, sterile gloves, sterile drape, hand hygiene and skin antiseptic. Stomach was inflated with air through the orogastric tube. The skin and subcutaneous tissues were anesthetized with 1% lidocaine. A 17 gauge needle was directed into the distended stomach with fluoroscopic guidance. A wire was advanced into the stomach and a T-tact was deployed. A 9-French vascular sheath was placed and the orogastric tube was snared using a Goos[place]ck snare device. The orogastric tube and snare were pulled out of the patient's mouth. The snare device was connected to a 20-French gastrostomy tube. The snare device and gastrostomy tube were pulled through the patient's mouth and out the anterior abdominal wall. The gastrostomy tube was cut to an appropriate length. Contrast injection through gastrostomy tube confirmed placement within the stomach. Fluoroscopic images were obtained for documentation. The gastrostomy tube was flushed with normal saline.  IMPRESSION: Successful fluoroscopic guided percutaneous gastrostomy tube placement. Electronically Signed   By: Scherrie Gerlach.D.  On: 09/24/2020 17:29   NM PET Image Restag (PS) Skull Base To Thigh  Result Date: 08/31/2020 CLINICAL DATA:  Subsequent treatment strategy for head neck carcinoma staging. Laryngeal carcinoma diagnosis July 2020. Patient undergone surgery and radiation treatment. Recurrence RIGHT level 2 lymph node recurrence followed modified RIGHT neck dissection revealing squamous cell carcinoma for 19 days EXAM: NUCLEAR MEDICINE PET SKULL BASE TO THIGH TECHNIQUE: 7.3 mCi F-18 FDG was injected intravenously. Full-ring PET imaging was performed from the skull base to thigh after the radiotracer. CT data was obtained and used for attenuation correction and anatomic localization. Fasting blood glucose: 90 mg/dl COMPARISON:  Neck CT 07/05/2020, PET-CT 10/27/2019. FINDINGS: Mediastinal blood pool activity: SUV max 2.0 Liver activity: SUV max NA NECK: Intense metabolic activity localizing to the RIGHT lateral hypopharynx with SUV max equal 10.6. This hypermetabolic activity corresponds to abnormal asymmetric thickening on comparison CT. The metabolic activity appears confined to the pharyngeal mucosa. The activity spans a large surface area from the level of the palatine and lingual tonsils inferiorly to the level of the vallecula. Hypermetabolic lesion posterior to the margin of the RIGHT sternocleidomastoid muscle with SUV max equal 3.8 (image 242). This tissue is poorly defined by CT imaging. On comparison contrast CT there is a enhancing nodule at this location (image 61/series 3/CT 07/05/2020). No contralateral hypermetabolic nodes. There is hypermetabolic lesion within the LEFT parotid gland measuring 5 mm with SUV max equal 3.5 on image 264 of the fused data set. 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 Incidental CT findings: none  CHEST: No hypermetabolic mediastinal or hilar nodes. No suspicious pulmonary nodules on the CT scan. Incidental CT findings: none ABDOMEN/PELVIS: No abnormal hypermetabolic activity within the liver, pancreas, adrenal glands, or spleen. No hypermetabolic lymph nodes in the abdomen or pelvis. Incidental CT findings: none SKELETON: No focal hypermetabolic activity to suggest skeletal metastasis. Incidental CT findings: none IMPRESSION: 1. Recurrence of head neck carcinoma with a broad hypermetabolic pharyngeal mucosal lesion involving the RIGHT lateral posterior oropharynx and hypopharynx extending from the palatine tonsil to the vallecula. 2. Hypermetabolic lymph node posterior sternocleidomastoid muscle on the RIGHT (level 3). 3. Hypermetabolic nodule within the LEFT parotid glands favored small primary parotid neoplasm. 4. No evidence thoracic metastasis. Electronically Signed   By: Suzy Bouchard M.D.   On: 08/31/2020 11:58   IR IMAGING GUIDED PORT INSERTION  Result Date: 09/24/2020 INDICATION: 70 year old with squamous cell carcinoma head and neck cancer. Request for Port-A-Cath and gastrostomy tube placement. EXAM: FLUOROSCOPIC AND ULTRASOUND GUIDED PLACEMENT OF A SUBCUTANEOUS PORT. Physician: Stephan Minister. [name], MD MEDICATIONS: Ancef 2 g ANESTHESIA/SEDATION: Versed 1.0 mg IV; Fentanyl 50 mcg IV; Moderate Sedation Time:  38 minutes The patient was continuously monitored during the procedure by the interventional radiology nurse under my direct supervision. FLUOROSCOPY TIME:  1 minutes, 30 seconds, 7 mGy COMPLICATIONS: None immediate. PROCEDURE: The procedure was explained to the patient. The risks and benefits of the procedure were discussed and the patient's questions were addressed. Informed consent was obtained from the patient. Patient was placed supine on the interventional table. Right internal jugular vein was not identified with ultrasound. Ultrasound confirmed a patent left internal jugular vein.  Ultrasound image was saved for documentation. The left chest and neck were cleaned with a skin antiseptic and a sterile drape was placed. Maximal barrier sterile technique was utilized including caps, mask, sterile gowns, sterile gloves, sterile drape, hand hygiene and skin antiseptic. The left neck was  anesthetized with 1% lidocaine. Small incision was made in the left neck with a blade. Micropuncture set was placed in the left IJ with ultrasound guidance. The left chest was anesthetized with 1% lidocaine with epinephrine. #15 blade was used to make an incision and a subcutaneous port pocket was formed. [place] was selected. Subcutaneous tunnel was formed with a stiff tunneling device. The port catheter was brought through the subcutaneous tunnel. The micropuncture set was exchanged for a peel-away sheath. The catheter was placed through the peel-away sheath and the tip was positioned at the superior cavoatrial junction. The catheter was cut to the appropriate length and attached to the port. The port was placed in the subcutaneous pocket. Catheter placement was confirmed with fluoroscopy. The port was accessed and flushed with heparinized saline. The port pocket was closed using two layers of absorbable sutures and Dermabond. The vein skin site was closed using a single layer of absorbable suture and Dermabond. Sterile dressings were applied. Patient tolerated the procedure well without an immediate complication. Ultrasound and fluoroscopic images were taken and saved for this procedure. IMPRESSION: Placement of a subcutaneous port device. Catheter tip at the superior cavoatrial junction. Electronically Signed   By: [name] M.D.   On: 09/24/2020 17:27    All questions were answered. The patient knows to call the clinic with any problems, questions or concerns.  I spent 20 minutes in the care of this patient including History, review of records, counseling and coordination of care. Labs  reviewed ok to proceed with treatment.  I connected with  [name] on 09/25/20 by telephone and verified that I am speaking with the correct person using two identifiers.   I discussed the limitations of evaluation and management by telemedicine. The patient expressed understanding and agreed to proceed.    [name], MD 09/25/2020 8:06 AM

## 2020-09-25 NOTE — Patient Instructions (Addendum)
[place] [place] Discharge Instructions for Patients Receiving Chemotherapy  Today you received the following chemotherapy agents: Cisplatin  To help prevent nausea and vomiting after your treatment, we encourage you to take your nausea medication as directed.   If you develop nausea and vomiting that is not controlled by your nausea medication, call the clinic.   BELOW ARE SYMPTOMS THAT SHOULD BE REPORTED IMMEDIATELY:  *FEVER GREATER THAN 100.5 F  *CHILLS WITH OR WITHOUT FEVER  NAUSEA AND VOMITING THAT IS NOT CONTROLLED WITH YOUR NAUSEA MEDICATION  *UNUSUAL SHORTNESS OF BREATH  *UNUSUAL BRUISING OR BLEEDING  TENDERNESS IN MOUTH AND THROAT WITH OR WITHOUT PRESENCE OF ULCERS  *URINARY PROBLEMS  *BOWEL PROBLEMS  UNUSUAL RASH Items with * indicate a potential emergency and should be followed up as soon as possible.  Feel free to call the clinic should you have any questions or concerns. The clinic phone number is [phone].  Please show the [facility] at check-in to the Emergency Department and triage nurse.  Cisplatin injection What is this medicine? CISPLATIN (SIS pla tin) is a chemotherapy drug. It targets fast dividing cells, like cancer cells, and causes these cells to die. This medicine is used to treat many types of cancer like bladder, ovarian, and testicular cancers. This medicine may be used for other purposes; ask your health care provider or pharmacist if you have questions. COMMON BRAND NAME(S): Platinol, Platinol -AQ What should I tell my health care provider before I take this medicine? They need to know if you have any of these conditions:  eye disease, vision problems  hearing problems  kidney disease  low blood counts, like white cells, platelets, or red blood cells  tingling of the fingers or toes, or other nerve disorder  an unusual or allergic reaction to cisplatin, carboplatin, oxaliplatin, other medicines, foods, dyes, or  preservatives  pregnant or trying to get pregnant  breast-feeding How should I use this medicine? This drug is given as an infusion into a vein. It is administered in a hospital or clinic by a specially trained health care professional. Talk to your pediatrician regarding the use of this medicine in children. Special care may be needed. Overdosage: If you think you have taken too much of this medicine contact a poison control center or emergency room at once. NOTE: This medicine is only for you. Do not share this medicine with others. What if I miss a dose? It is important not to miss a dose. Call your doctor or health care professional if you are unable to keep an appointment. What may interact with this medicine? This medicine may interact with the following medications:  foscarnet  certain antibiotics like amikacin, gentamicin, neomycin, polymyxin B, streptomycin, tobramycin, vancomycin This list may not describe all possible interactions. Give your health care provider a list of all the medicines, herbs, non-prescription drugs, or dietary supplements you use. Also tell them if you smoke, drink alcohol, or use illegal drugs. Some items may interact with your medicine. What should I watch for while using this medicine? Your condition will be monitored carefully while you are receiving this medicine. You will need important blood work done while you are taking this medicine. This drug may make you feel generally unwell. This is not uncommon, as chemotherapy can affect healthy cells as well as cancer cells. Report any side effects. Continue your course of treatment even though you feel ill unless your doctor tells you to stop. This medicine may increase your  risk of getting an infection. Call your healthcare professional for advice if you get a fever, chills, or sore throat, or other symptoms of a cold or flu. Do not treat yourself. Try to avoid being around people who are sick. Avoid taking  medicines that contain aspirin, acetaminophen, ibuprofen, naproxen, or ketoprofen unless instructed by your healthcare professional. These medicines may hide a fever. This medicine may increase your risk to bruise or bleed. Call your doctor or health care professional if you notice any unusual bleeding. Be careful brushing and flossing your teeth or using a toothpick because you may get an infection or bleed more easily. If you have any dental work done, tell your dentist you are receiving this medicine. Do not become pregnant while taking this medicine or for 14 months after stopping it. Women should inform their healthcare professional if they wish to become pregnant or think they might be pregnant. Men should not father a child while taking this medicine and for 11 months after stopping it. There is potential for serious side effects to an unborn child. Talk to your healthcare professional for more information. Do not breast-feed an infant while taking this medicine. This medicine has caused ovarian failure in some women. This medicine may make it more difficult to get pregnant. Talk to your healthcare professional if you are concerned about your fertility. This medicine has caused decreased sperm counts in some men. This may make it more difficult to father a child. Talk to your healthcare professional if you are concerned about your fertility. Drink fluids as directed while you are taking this medicine. This will help protect your kidneys. Call your doctor or health care professional if you get diarrhea. Do not treat yourself. What side effects may I notice from receiving this medicine? Side effects that you should report to your doctor or health care professional as soon as possible:  allergic reactions like skin rash, itching or hives, swelling of the face, lips, or tongue  blurred vision  changes in vision  decreased hearing or ringing of the ears  nausea, vomiting  pain, redness, or  irritation at site where injected  pain, tingling, numbness in the hands or feet  signs and symptoms of bleeding such as bloody or black, tarry stools; red or dark brown urine; spitting up blood or brown material that looks like coffee grounds; red spots on the skin; unusual bruising or bleeding from the eyes, gums, or nose  signs and symptoms of infection like fever; chills; cough; sore throat; pain or trouble passing urine  signs and symptoms of kidney injury like trouble passing urine or change in the amount of urine  signs and symptoms of low red blood cells or anemia such as unusually weak or tired; feeling faint or lightheaded; falls; breathing problems Side effects that usually do not require medical attention (report to your doctor or health care professional if they continue or are bothersome):  loss of appetite  mouth sores  muscle cramps This list may not describe all possible side effects. Call your doctor for medical advice about side effects. You may report side effects to FDA at 1-800-FDA-1088. Where should I keep my medicine? This drug is given in a hospital or clinic and will not be stored at home. NOTE: This sheet is a summary. It may not cover all possible information. If you have questions about this medicine, talk to your doctor, pharmacist, or health care provider.  2021 Elsevier/Gold Standard (2018-08-27 15:59:17)

## 2020-09-26 ENCOUNTER — Ambulatory Visit
Admission: RE | Admit: 2020-09-26 | Discharge: 2020-09-26 | Disposition: A | Discharge status: Home / Self Care | Payer: Medicare HMO | Source: Ambulatory Visit | Attending: Radiation Oncology | Admitting: Radiation Oncology

## 2020-09-26 ENCOUNTER — Telehealth: Payer: Self-pay | Admitting: Hematology and Oncology

## 2020-09-26 ENCOUNTER — Ambulatory Visit: Payer: Medicare HMO | Admitting: Nutrition

## 2020-09-26 DIAGNOSIS — C329 Malignant neoplasm of larynx, unspecified: Secondary | ICD-10-CM | POA: Diagnosis not present

## 2020-09-26 MED ORDER — OSMOLITE 1.5 CAL PO LIQD: ORAL | 0 refills | Status: DC

## 2020-09-26 NOTE — Progress Notes (Signed)
Oncology Nurse Navigator Documentation  By chance I met [name] on his way to his radiation treatment today. He asked that I help him with the dressing to his PEG. After his treatment, I walked him to the nursing area and helped him change the dressing to his PEG and flush the PEG. He voiced better understanding regarding care of the PEG. I then accompanied him to his nutrition appointment with [name] RD. He knows to call me if he has any further needs or questions.   Alan Asa RN, BSN, OCN Head & Neck Oncology Nurse [place] at [facility] Phone # [phone]  Fax # [phone]

## 2020-09-26 NOTE — Progress Notes (Signed)
Patient is a 70 year old male diagnosed with recurrent laryngeal cancer receiving concurrent chemoradiation therapy.  He is status post PEG placement on January 10.  He is familiar from previous treatment.  Weight was documented as 128.8 pounds January 11.   This is an 11% decrease over 7 months and a 7% decrease over 5 months. Patient reports he is drinking Ensure Plus and some Osmolite 1.5.  He is eating some soft food.  He denies current nutrition impact symptoms.  Estimated nutrition needs: 2000-2200 cal, 80-95 g protein, 2.2 L fluid.  Nutrition diagnosis: Unintended weight loss related to recurrent cancer as evidenced by 7% weight loss over 5 months.  Intervention: Educated patient to begin Osmolite 1.5 via PEG.  He will infuse 1 bottle 4 times daily with 90 mL free water before and after bolus feeding.  Instructions given to increase to 1-1/2 bottles 4 times daily as tolerated with same free water flushes.  Patient encouraged to continue free water by mouth with minimum of 480 mL daily. Patient also educated to continue soft foods and other liquids as tolerated. Patient demonstrated tube feeding administration.  I provided written information. Tube feeding orders were written and [facility] notified. Questions were answered.  Teach back method used.  Contact information provided.  6 cartons Osmolite 1.5+ free water flushes provides 2130 cal, 89.4 g protein, 2352 mL free water.  This is 100% estimated nutrition needs.  Monitoring, evaluation, goals: Patient will tolerate tube feeding plus oral intake to promote weight stabilization and healing.  Next visit: Tuesday, January 18 during infusion.  **Disclaimer: This note was dictated with voice recognition software. Similar sounding words can inadvertently be transcribed and this note may contain transcription errors which may not have been corrected upon publication of note.**

## 2020-09-26 NOTE — Telephone Encounter (Signed)
Scheduled appointments per 1/11 los. Spoke to patient who is aware of all upcoming appointments. Will give patient calendar tomorrow when he comes in for his radiation appointment.

## 2020-09-27 ENCOUNTER — Other Ambulatory Visit: Payer: Self-pay

## 2020-09-27 ENCOUNTER — Ambulatory Visit
Admission: RE | Admit: 2020-09-27 | Discharge: 2020-09-27 | Disposition: A | Discharge status: Home / Self Care | Payer: Medicare HMO | Source: Ambulatory Visit | Attending: Radiation Oncology | Admitting: Radiation Oncology

## 2020-09-27 DIAGNOSIS — C329 Malignant neoplasm of larynx, unspecified: Secondary | ICD-10-CM | POA: Diagnosis not present

## 2020-09-28 ENCOUNTER — Other Ambulatory Visit: Payer: Self-pay

## 2020-09-28 ENCOUNTER — Ambulatory Visit
Admission: RE | Admit: 2020-09-28 | Discharge: 2020-09-28 | Disposition: A | Discharge status: Home / Self Care | Payer: Medicare HMO | Source: Ambulatory Visit | Attending: Radiation Oncology | Admitting: Radiation Oncology

## 2020-09-28 ENCOUNTER — Encounter: Payer: Self-pay | Admitting: Hematology and Oncology

## 2020-09-28 ENCOUNTER — Telehealth: Payer: Self-pay | Admitting: Nutrition

## 2020-09-28 DIAGNOSIS — C329 Malignant neoplasm of larynx, unspecified: Secondary | ICD-10-CM | POA: Diagnosis not present

## 2020-09-28 NOTE — Progress Notes (Signed)
Met with patient in side lobby area to obtain signature for [employer].  Patient approved for one-time $1000 [employer] grant to assist with personal expenses while going through treatment. Discussed expenses and how they are covered. He has the expense sheet along with [facility] pharmacy information. A copy of the approval letter will be left for him at front desk in drawer as he had to catch his ride.  He has my card for any additional financial questions or concerns.

## 2020-09-28 NOTE — Telephone Encounter (Signed)
Veronica contacted me concerned Home health had delivered Ensure instead of Osmolite 1.5.  She was told home care agency does not have any Osmolite 1.5 so substitution was made.  I explained Ensure Plus was equivalent to Osmolite 1.5 and patient could infuse this instead of Osmolite 1.5 until original formula delivered.  She is appreciative for the information.

## 2020-10-01 ENCOUNTER — Ambulatory Visit: Payer: Medicare HMO

## 2020-10-02 ENCOUNTER — Inpatient Hospital Stay: Payer: Medicare HMO

## 2020-10-02 ENCOUNTER — Other Ambulatory Visit: Payer: Self-pay

## 2020-10-02 ENCOUNTER — Encounter: Payer: Self-pay | Admitting: Hematology and Oncology

## 2020-10-02 ENCOUNTER — Inpatient Hospital Stay: Payer: Medicare HMO | Admitting: Nutrition

## 2020-10-02 ENCOUNTER — Inpatient Hospital Stay (HOSPITAL_BASED_OUTPATIENT_CLINIC_OR_DEPARTMENT_OTHER): Payer: Medicare HMO | Admitting: Hematology and Oncology

## 2020-10-02 ENCOUNTER — Ambulatory Visit
Admission: RE | Admit: 2020-10-02 | Discharge: 2020-10-02 | Disposition: A | Discharge status: Home / Self Care | Payer: Medicare HMO | Source: Ambulatory Visit | Attending: Radiation Oncology | Admitting: Radiation Oncology

## 2020-10-02 ENCOUNTER — Inpatient Hospital Stay: Payer: Medicare HMO | Admitting: Hematology and Oncology

## 2020-10-02 VITALS — BP 122/56 | HR 89 | Temp 98.2°F | Resp 20 | Ht 68.0 in | Wt 132.3 lb

## 2020-10-02 DIAGNOSIS — Z5111 Encounter for antineoplastic chemotherapy: Secondary | ICD-10-CM | POA: Diagnosis not present

## 2020-10-02 DIAGNOSIS — C090 Malignant neoplasm of tonsillar fossa: Secondary | ICD-10-CM | POA: Diagnosis not present

## 2020-10-02 DIAGNOSIS — C329 Malignant neoplasm of larynx, unspecified: Secondary | ICD-10-CM | POA: Diagnosis not present

## 2020-10-02 LAB — CBC WITH DIFFERENTIAL/PLATELET
Abs Immature Granulocytes: 0.24 10*3/uL — ABNORMAL HIGH (ref 0.00–0.07)
Basophils Absolute: 0 10*3/uL (ref 0.0–0.1)
Basophils Relative: 0 %
Eosinophils Absolute: 0.2 10*3/uL (ref 0.0–0.5)
Eosinophils Relative: 2 %
HCT: 35.9 % — ABNORMAL LOW (ref 39.0–52.0)
Hemoglobin: 12.3 g/dL — ABNORMAL LOW (ref 13.0–17.0)
Immature Granulocytes: 3 %
Lymphocytes Relative: 11 %
Lymphs Abs: 0.8 10*3/uL (ref 0.7–4.0)
MCH: 30.4 pg (ref 26.0–34.0)
MCHC: 34.3 g/dL (ref 30.0–36.0)
MCV: 88.9 fL (ref 80.0–100.0)
Monocytes Absolute: 0.8 10*3/uL (ref 0.1–1.0)
Monocytes Relative: 12 %
Neutro Abs: 5.1 10*3/uL (ref 1.7–7.7)
Neutrophils Relative %: 72 %
Platelets: 274 10*3/uL (ref 150–400)
RBC: 4.04 MIL/uL — ABNORMAL LOW (ref 4.22–5.81)
RDW: 11.7 % (ref 11.5–15.5)
WBC: 7.2 10*3/uL (ref 4.0–10.5)
nRBC: 0 % (ref 0.0–0.2)

## 2020-10-02 LAB — BASIC METABOLIC PANEL
Anion gap: 10 (ref 5–15)
BUN: 16 mg/dL (ref 8–23)
CO2: 29 mmol/L (ref 22–32)
Calcium: 9.3 mg/dL (ref 8.9–10.3)
Chloride: 94 mmol/L — ABNORMAL LOW (ref 98–111)
Creatinine, Ser: 0.68 mg/dL (ref 0.61–1.24)
GFR, Estimated: >60 mL/min (ref >60)
Glucose, Bld: 157 mg/dL — ABNORMAL HIGH (ref 70–99)
Potassium: 4.2 mmol/L (ref 3.5–5.1)
Sodium: 133 mmol/L — ABNORMAL LOW (ref 135–145)

## 2020-10-02 LAB — MAGNESIUM: Magnesium: 1.9 mg/dL (ref 1.7–2.4)

## 2020-10-02 MED ORDER — POTASSIUM CHLORIDE IN NACL 20-0.9 MEQ/L-% IV SOLN
Freq: Once | INTRAVENOUS | Status: AC
  Filled 2020-10-02: qty 1000

## 2020-10-02 MED ORDER — SODIUM CHLORIDE 0.9 % IV SOLN
150.0000 mg | Freq: Once | INTRAVENOUS | Status: AC
  Administered 2020-10-02: 150 mg via INTRAVENOUS
  Filled 2020-10-02: qty 150

## 2020-10-02 MED ORDER — MAGNESIUM SULFATE 2 GM/50ML IV SOLN
INTRAVENOUS | Status: AC
  Filled 2020-10-02: qty 50

## 2020-10-02 MED ORDER — MAGNESIUM SULFATE 2 GM/50ML IV SOLN
2.0000 g | Freq: Once | INTRAVENOUS | Status: AC
  Administered 2020-10-02: 2 g via INTRAVENOUS

## 2020-10-02 MED ORDER — HYDROCODONE-ACETAMINOPHEN 7.5-325 MG/15ML PO SOLN: 10.0000 mL | Freq: Four times a day (QID) | ORAL | 0 refills | Status: DC | PRN

## 2020-10-02 MED ORDER — SODIUM CHLORIDE 0.9 % IV SOLN
40.0000 mg/m2 | Freq: Once | INTRAVENOUS | Status: AC
  Administered 2020-10-02: 68 mg via INTRAVENOUS
  Filled 2020-10-02: qty 68

## 2020-10-02 MED ORDER — PALONOSETRON HCL INJECTION 0.25 MG/5ML
0.2500 mg | Freq: Once | INTRAVENOUS | Status: AC
  Administered 2020-10-02: 0.25 mg via INTRAVENOUS

## 2020-10-02 MED ORDER — SODIUM CHLORIDE 0.9 % IV SOLN
10.0000 mg | Freq: Once | INTRAVENOUS | Status: AC
  Administered 2020-10-02: 10 mg via INTRAVENOUS
  Filled 2020-10-02: qty 10

## 2020-10-02 MED ORDER — SODIUM CHLORIDE 0.9 % IV SOLN
Freq: Once | INTRAVENOUS | Status: AC
  Filled 2020-10-02: qty 250

## 2020-10-02 NOTE — Progress Notes (Signed)
Oncology Nurse Navigator Documentation  I received a call from Dr. Chryl Heck today regarding concerns about Mr. Brunton' PEG site. She was concerned about possible infection. I went and visited with him in Infusion and there is yellow/greenish drainage from his PEG site with swelling noted above the insertion site. The area is also very tender to touch for Mr. Vannice. I have called IR at [facility] and left a voice mail with the PA to call me back and with the above information. Mr. Dougal' caregiver Verdene Lennert called and I have updated her and will call her back when I know how Interventional Radiology will proceed.  Harlow Asa RN, BSN, OCN Head & Neck Oncology Nurse [place] at [facility] Phone # [phone]  Fax # [phone]

## 2020-10-02 NOTE — Progress Notes (Signed)
Ok to run post hydration fluids with Cisplatin per MD today Pt discharged in no apparent distress. Pt left ambulatory without assistance.  Pt aware of discharge instructions and verbalized understanding and had no further questions.

## 2020-10-02 NOTE — Patient Instructions (Signed)
[place] Discharge Instructions for Patients Receiving Chemotherapy  Today you received the following chemotherapy agents: Cisplatin  To help prevent nausea and vomiting after your treatment, we encourage you to take your nausea medication as directed.   If you develop nausea and vomiting that is not controlled by your nausea medication, call the clinic.   BELOW ARE SYMPTOMS THAT SHOULD BE REPORTED IMMEDIATELY:  *FEVER GREATER THAN 100.5 F  *CHILLS WITH OR WITHOUT FEVER  NAUSEA AND VOMITING THAT IS NOT CONTROLLED WITH YOUR NAUSEA MEDICATION  *UNUSUAL SHORTNESS OF BREATH  *UNUSUAL BRUISING OR BLEEDING  TENDERNESS IN MOUTH AND THROAT WITH OR WITHOUT PRESENCE OF ULCERS  *URINARY PROBLEMS  *BOWEL PROBLEMS  UNUSUAL RASH Items with * indicate a potential emergency and should be followed up as soon as possible.  Feel free to call the clinic should you have any questions or concerns. The clinic phone number is [phone].  Please show the [facility] at check-in to the Emergency Department and triage nurse.  Cisplatin injection What is this medicine? CISPLATIN (SIS pla tin) is a chemotherapy drug. It targets fast dividing cells, like cancer cells, and causes these cells to die. This medicine is used to treat many types of cancer like bladder, ovarian, and testicular cancers. This medicine may be used for other purposes; ask your health care provider or pharmacist if you have questions. COMMON BRAND NAME(S): Platinol, Platinol -AQ What should I tell my health care provider before I take this medicine? They need to know if you have any of these conditions:  eye disease, vision problems  hearing problems  kidney disease  low blood counts, like white cells, platelets, or red blood cells  tingling of the fingers or toes, or other nerve disorder  an unusual or allergic reaction to cisplatin, carboplatin, oxaliplatin, other medicines, foods, dyes, or  preservatives  pregnant or trying to get pregnant  breast-feeding How should I use this medicine? This drug is given as an infusion into a vein. It is administered in a hospital or clinic by a specially trained health care professional. Talk to your pediatrician regarding the use of this medicine in children. Special care may be needed. Overdosage: If you think you have taken too much of this medicine contact a poison control center or emergency room at once. NOTE: This medicine is only for you. Do not share this medicine with others. What if I miss a dose? It is important not to miss a dose. Call your doctor or health care professional if you are unable to keep an appointment. What may interact with this medicine? This medicine may interact with the following medications:  foscarnet  certain antibiotics like amikacin, gentamicin, neomycin, polymyxin B, streptomycin, tobramycin, vancomycin This list may not describe all possible interactions. Give your health care provider a list of all the medicines, herbs, non-prescription drugs, or dietary supplements you use. Also tell them if you smoke, drink alcohol, or use illegal drugs. Some items may interact with your medicine. What should I watch for while using this medicine? Your condition will be monitored carefully while you are receiving this medicine. You will need important blood work done while you are taking this medicine. This drug may make you feel generally unwell. This is not uncommon, as chemotherapy can affect healthy cells as well as cancer cells. Report any side effects. Continue your course of treatment even though you feel ill unless your doctor tells you to stop. This medicine may increase your  risk of getting an infection. Call your healthcare professional for advice if you get a fever, chills, or sore throat, or other symptoms of a cold or flu. Do not treat yourself. Try to avoid being around people who are sick. Avoid taking  medicines that contain aspirin, acetaminophen, ibuprofen, naproxen, or ketoprofen unless instructed by your healthcare professional. These medicines may hide a fever. This medicine may increase your risk to bruise or bleed. Call your doctor or health care professional if you notice any unusual bleeding. Be careful brushing and flossing your teeth or using a toothpick because you may get an infection or bleed more easily. If you have any dental work done, tell your dentist you are receiving this medicine. Do not become pregnant while taking this medicine or for 14 months after stopping it. Women should inform their healthcare professional if they wish to become pregnant or think they might be pregnant. Men should not father a child while taking this medicine and for 11 months after stopping it. There is potential for serious side effects to an unborn child. Talk to your healthcare professional for more information. Do not breast-feed an infant while taking this medicine. This medicine has caused ovarian failure in some women. This medicine may make it more difficult to get pregnant. Talk to your healthcare professional if you are concerned about your fertility. This medicine has caused decreased sperm counts in some men. This may make it more difficult to father a child. Talk to your healthcare professional if you are concerned about your fertility. Drink fluids as directed while you are taking this medicine. This will help protect your kidneys. Call your doctor or health care professional if you get diarrhea. Do not treat yourself. What side effects may I notice from receiving this medicine? Side effects that you should report to your doctor or health care professional as soon as possible:  allergic reactions like skin rash, itching or hives, swelling of the face, lips, or tongue  blurred vision  changes in vision  decreased hearing or ringing of the ears  nausea, vomiting  pain, redness, or  irritation at site where injected  pain, tingling, numbness in the hands or feet  signs and symptoms of bleeding such as bloody or black, tarry stools; red or dark brown urine; spitting up blood or brown material that looks like coffee grounds; red spots on the skin; unusual bruising or bleeding from the eyes, gums, or nose  signs and symptoms of infection like fever; chills; cough; sore throat; pain or trouble passing urine  signs and symptoms of kidney injury like trouble passing urine or change in the amount of urine  signs and symptoms of low red blood cells or anemia such as unusually weak or tired; feeling faint or lightheaded; falls; breathing problems Side effects that usually do not require medical attention (report to your doctor or health care professional if they continue or are bothersome):  loss of appetite  mouth sores  muscle cramps This list may not describe all possible side effects. Call your doctor for medical advice about side effects. You may report side effects to FDA at 1-800-FDA-1088. Where should I keep my medicine? This drug is given in a hospital or clinic and will not be stored at home. NOTE: This sheet is a summary. It may not cover all possible information. If you have questions about this medicine, talk to your doctor, pharmacist, or health care provider.  2021 Elsevier/Gold Standard (2018-08-27 15:59:17)

## 2020-10-02 NOTE — Progress Notes (Signed)
Nutrition follow-up completed with patient during infusion for recurrent laryngeal cancer. Patient reports he has had some nausea and some constipation. Weight improved and was documented as 132.3 pounds on January 18 which is increased from 128.8 pounds on January 11. Reports he has only been able to tolerate 4 cartons of Osmolite 1.5/Ensure Plus via PEG.  He is requesting a delivery of tube feeding.  Nutrition diagnosis: Unintentional weight loss improved.  Intervention: Educated patient to work to [employer] or Osmolite 1.5 via feeding tube to 6 cartons daily with free water flushes to provide 2130 cal, 89.4 g protein, 2352 mL free water. Encourage patient to take medication as prescribed by MD to improve nausea and constipation. Contacted [facility] and requested tube feeding delivery.  Monitoring, evaluation, goals: Patient will tolerate tube feeding plus oral intake to meet minimum estimated nutrition needs to promote healing and weight gain/weight stabilization.  Next visit: Tuesday, January 25 during infusion.  **Disclaimer: This note was dictated with voice recognition software. Similar sounding words can inadvertently be transcribed and this note may contain transcription errors which may not have been corrected upon publication of note.**

## 2020-10-02 NOTE — Progress Notes (Signed)
[place] NOTE  Patient Care Team: [name], MD as PCP - General (Internal Medicine) Izora Gala, MD as Consulting Physician (Otolaryngology) [name], MD as Attending Physician (Radiation Oncology) Leota Sauers, RN (Inactive) as Oncology Nurse Navigator Karie Mainland, RD as Dietitian (Nutrition) Valentino Saxon [place], CCC-SLP as Speech Language Pathologist (Speech Pathology) Kennith Center, LCSW as Social Worker  CHIEF COMPLAINTS/PURPOSE OF CONSULTATION:  Recurrent SCC of larynx  ASSESSMENT & PLAN:   No problem-specific Assessment & Plan notes found for this encounter.  No orders of the defined types were placed in this encounter.  This is a very pleasant 70 year old male patient with history of recurrent laryngeal cancer now with a right oropharyngeal mass referred to medical oncology for further recommendations.  Please refer to the below mentioned history.   He was first diagnosed with T2N0 laryngeal cancer back in August 2020 and underwent definitive radiotherapy since he refused surgical treatment.  He then had a recurrence in Oct 2021 and had modified radical neck dissection, now presented with soft tissue thickening and enhancement along the right lateral pharynx. PET showed recurrence of head and neck cancer with broad hypermetabolic pharyngeal mucosal lesion involving right lateral posterior oropharynx and hypopharynx extending from palatine tonsil to the vallecula.Hypermetabolic lymph node posterior sternocleidomastoid muscle on the RIGHT (level 3).Hypermetabolic nodule within the LEFT parotid glands favored small primary parotid neoplasm. He saw Dr. Conley Canal from [facility], and recommendation is to proceed with chemo radiation first and consider salvage surgery if incomplete response. I discussed that this is a reasonable approach given his recurrence. I discussed about side effects from cisplatin including but not limited to fatigue,  nausea, vomiting, increased risk of infections, ototoxicity, nephrotoxicity and clearly explained that some of the side effects can be permanent. I have encouraged considering a PEG tube and a Port-A-Cath for chemotherapy administration. He is agreeable and this has been ordered. He understands that there is no guarantee for cure despite what ever modality of treatment we choose. He is s/p C1 of chemotherapy, 09/25/2020. Ok to proceed with chemo today if all labs within parameters, CBC reviewed and within parameters, BMP pending at this time. No evidence of systemic infection.  2. Chemo induced nausea or vomiting,  Recommended to schedule zofran from D3-D5 which is when his symptoms are most bothersome. He expressed understanding of the recommendations.  3 Pain control, ok to use liquid hydrocodone as needed for cancer and treatment related pain. Will refill this.  4. Constipation, advised using docusate daily and miralax as needed.  He has been using docusate, encouraged to try miralax.  5. Weight changes, gained about 4 lbs since last visit. Will continue to monitor.   All his questions were answered to the best of my knowledge.   Thank you for consulting [place] in the care of this patient.  HISTORY OF PRESENTING ILLNESS:   [name] 71 y.o. male is here because of laryngeal cancer  [name] is a 70 y.o. male who has been treated for laryngeal cancer in the past.   Aug 2020, Pt had presented with dysphagia and difficulty breathing through his mouth, 20 lb weight loss. He saw Dr. Erik Obey who performed laryngoscopy and appreciated a 2 to 3 cm ovoid pedunculated mass arising from the right aryepiglottic fold and impinging on the supraglottic airway.  The vocal cords appeared to be mobile.  Urgent panendoscopy and biopsy were recommended to protect his airway.  On 04/01/2019 biopsy/debulking revealed  squamous cell carcinoma with focal sarcomatoid features, poorly differentiated, P 16  neg. According to his op note,a bulky necrotic and semi-pedunculated tumor coming off the lateral surface of the right aryepiglottic fold and the anterior and lateral aspect of the piriform sinus on the right side. Airway was compromised by the tumor mass at intubation.  CT scan of the neck on 04/11/2019 showed glottic closure with no asymmetry of the cords, correlate with laryngoscopy results.  Questionable right level 2 lymph node measured 7 mm.  No definite pathologic lymph nodes in the neck.  PET scan on 04/22/2019 revealed mild residual activity in the posterior right hypopharynx confined to the mucosa with no evidence of metastatic disease to the neck or distantly.  He underwent definitive radiotherapy to T2N0 laryngeal cancer  since he refused surgical treatment.  He then had PET imaging which showed a right neck lymph node, suspicious on PET. Biopsy of right neck lymph node is positive for recurrence. He underwent right modified radical neck dissection including levels 2 and 3, including the internal jugular vein, suture marks the inferior jugular vein stump.  He most recently underwent a soft tissue neck CT scan on the date of 07/05/2020 that revealed asymmetric soft tissue thickening and enhancement along the right lateral pharynx. It also showed suspicious small 6 mm hyperenhancing nodular soft tissue along the posterior margin of the neck dissect at right level 3. Additionally, there was a mild inflammatory appearing right upper lobe centrilobular ground-glass opacity that was new (consider mild or developing upper lobe infection), post radiation changes to the lung apices, and sequelae of radiation and right neck dissection from previous neck CT.  Subsequently, the patient underwent a direct laryngoscopy and biopsy of oropharyngeal mass on the date of 08/13/2020. Pathology from the procedure revealed poorly differentiated squamous cell carcinoma.  PET showed recurrence of head and neck  cancer with broad hypermetabolic pharyngeal mucosal lesion involving right lateral posterior oropharynx and hypopharynx extending from palatine tonsil to the vallecula.Hypermetabolic lymph node posterior sternocleidomastoid muscle on the RIGHT (level 3).Hypermetabolic nodule within the LEFT parotid glands favored small primary parotid neoplasm.  Recommendation was to consider proceeding with upfront concurrent chemo radiation followed by salvage surgery if needed. S/p C1 of chemotherapy.  He complains of chemotherapy induced nausea, constipation and pain around the PEG tube and some sore throat. He ate some solid food over the weekend but now is using the PEG tube. He has been using Ensure about 5 bottles a day, gained 4 lbs since last visit. No diarrhea, dysuria. No change in breathing. He thinks the neck swelling is improving. He has noted some drainage around the PEG tube. No foul smell to it. Rest of the pertinent 10 point ROS reviewed and negative.  MEDICAL HISTORY:  Past Medical History:  Diagnosis Date  . Cancer ([place])    Throat cancer 2019  . ETOH abuse   . Frequent urination   . Glaucoma   . Hepatitis C virus infection without hepatic coma    dx'ed in 11/2018  . History of radiation therapy 05/12/19- 07/06/19   Larynx  . Hypertension   . Wears denture    upper only; lost lower denture    SURGICAL HISTORY: Past Surgical History:  Procedure Laterality Date  . ANKLE SURGERY  2011   right ankle  . COLONOSCOPY  02/2019   polyps - Dr Havery Moros  . DIRECT LARYNGOSCOPY N/A 04/01/2019   Procedure: DIRECT LARYNGOSCOPY WITH BIOPSY;  Surgeon: Jodi Marble, MD;  Location:  [facility];  Service: ENT;  Laterality: N/A;  . DIRECT LARYNGOSCOPY N/A 01/06/2020   Procedure: DIRECT LARYNGOSCOPY;  Surgeon: Izora Gala, MD;  Location: [facility];  Service: ENT;  Laterality: N/A;  . DIRECT LARYNGOSCOPY N/A 08/13/2020   Procedure: DIRECT LARYNGOSCOPY;  Surgeon: Izora Gala, MD;  Location: [facility];  Service: ENT;  Laterality: N/A;  . ESOPHAGOSCOPY N/A 04/01/2019   Procedure: ESOPHAGOSCOPY;  Surgeon: Jodi Marble, MD;  Location: [facility];  Service: ENT;  Laterality: N/A;  . EXCISION ORAL TUMOR Right 08/13/2020   Procedure: BIOPSY OF OROPHARYNGEAL MASS;  Surgeon: Izora Gala, MD;  Location: [facility];  Service: ENT;  Laterality: Right;  . EYE SURGERY Right   . IR GASTROSTOMY TUBE MOD SED  09/24/2020  . IR IMAGING GUIDED PORT INSERTION  09/24/2020  . KNEE SURGERY    . LARYNGOSCOPY AND BRONCHOSCOPY N/A 04/01/2019   Procedure: BRONCHOSCOPY;  Surgeon: Jodi Marble, MD;  Location: [facility];  Service: ENT;  Laterality: N/A;  . RADICAL NECK DISSECTION N/A 01/06/2020   Procedure: RADICAL NECK DISSECTION;  Surgeon: Izora Gala, MD;  Location: [facility] OR;  Service: ENT;  Laterality: N/A;    SOCIAL HISTORY: Social History   Socioeconomic History  . Marital status: Single    Spouse name: Not on file  . Number of children: 2  . Years of education: Not on file  . Highest education level: Not on file  Occupational History  . Not on file  Tobacco Use  . Smoking status: Former Smoker    Years: 50.00    Types: Cigarettes    Quit date: 06/01/2020    Years since quitting: 0.3  . Smokeless tobacco: Never Used  Vaping Use  . Vaping Use: Never used  Substance and Sexual Activity  . Alcohol use: Yes    Alcohol/week: 4.0 standard drinks    Types: 4 Cans of beer per week    Comment: 40oz beer daily  . Drug use: Yes    Types: Cocaine    Comment: none in 2 yrs  . Sexual activity: Yes    Partners: Female  Other Topics Concern  . Not on file  Social History Narrative   Patient is divorced with 2 children.   Patient is currently living with his sister.   Patient with a history of smoking a third of pack of cigarettes daily for 50 years.  Patient currently smoking 2 to 3 cigarettes/day.   Patient has never used smokeless tobacco.   Patient with occasional use of alcohol.    Patient last used cocaine approximately 6 months ago.  Patient denies use of marijuana.   Social Determinants of Health   Financial Resource Strain: Not on file  Food Insecurity: No Food Insecurity  . Worried About Charity fundraiser in the Last Year: Never true  . Ran Out of Food in the Last Year: Never true  Transportation Needs: No Transportation Needs  . Lack of Transportation (Medical): No  . Lack of Transportation (Non-Medical): No  Physical Activity: Not on file  Stress: Not on file  Social Connections: Unknown  . Frequency of Communication with Friends and Family: Three times a week  . Frequency of Social Gatherings with Friends and Family: More than three times a week  . Attends Religious Services: Not on file  . Active Member of Clubs or Organizations: Not on file  . Attends Archivist Meetings: Not on file  . Marital Status: Not on file  Intimate Partner Violence: Not on file    FAMILY HISTORY: Family History  Problem Relation Age of Onset  . Breast cancer Sister   . Colon cancer Brother 80       ????  . Cancer Brother     ALLERGIES:  has No Known Allergies.  MEDICATIONS:  Current Outpatient Medications  Medication Sig Dispense Refill  . atropine 1 % ophthalmic solution Place 1 drop into the right eye 2 (two) times a day.     . cetirizine (ZYRTEC) 10 MG tablet Take 10 mg by mouth daily as needed for allergies.    Marland Kitchen dexamethasone (DECADRON) 4 MG tablet Take 2 tablets (8 mg total) by mouth daily. Take daily x 3 days starting the day after cisplatin chemotherapy. Take with food. 30 tablet 1  . docusate sodium (COLACE) 50 MG capsule Take 1 capsule (50 mg total) by mouth 2 (two) times daily. 10 capsule 0  . ibuprofen (ADVIL) 200 MG tablet Take 400 mg by mouth every 6 (six) hours as needed for headache or moderate pain.    Marland Kitchen latanoprost (XALATAN) 0.005 % ophthalmic solution Place 1 drop into the right eye at bedtime.     . lidocaine (XYLOCAINE) 2 %  solution Patient: Mix 1part 2% viscous lidocaine, 1part H20. Swish AND swallow 72mL of diluted mixture, 13min before meals and at bedtime, up to QID (Patient taking differently: Use as directed in the mouth or throat See admin instructions. Patient: Mix 1part 2% viscous lidocaine, 1part H20. Swish AND swallow 45mL of diluted mixture, 47min before meals and at bedtime, up to QID) 200 mL 3  . lidocaine-prilocaine (EMLA) cream Apply to affected area once (Patient taking differently: Apply 1 application topically daily as needed (port access). Apply to affected area once) 30 g 3  . LORazepam (ATIVAN) 0.5 MG tablet Take 1 tablet (0.5 mg total) by mouth every 6 (six) hours as needed (Nausea or vomiting). 30 tablet 0  . losartan (COZAAR) 50 MG tablet Take 50 mg by mouth daily.    . Nutritional Supplements (FEEDING SUPPLEMENT, OSMOLITE 1.5 CAL,) LIQD Give 1 carton Osmolite 1.5 via PEG 4 times daily with 90 mL free water before and after bolus feeding.  Increase to 1-1/2 cartons 4 times daily.  In addition, drink or flush tube with an additional 240 mL free water twice daily.  This provides 2130 cal, 89.4 g protein and 2352 mL free water which is 100% estimated needs. 1422 mL 0  . ondansetron (ZOFRAN) 8 MG tablet Take 1 tablet (8 mg total) by mouth 2 (two) times daily as needed. Start on the third day after cisplatin chemotherapy. 30 tablet 1  . prednisoLONE acetate (PRED FORTE) 1 % ophthalmic suspension Place 1 drop into both eyes daily.    . prochlorperazine (COMPAZINE) 10 MG tablet Take 1 tablet (10 mg total) by mouth every 6 (six) hours as needed (Nausea or vomiting). 30 tablet 1  . tolterodine (DETROL LA) 4 MG 24 hr capsule Take 4 mg by mouth daily.    . Vitamin D, Ergocalciferol, (DRISDOL) 1.25 MG (50000 UNIT) CAPS capsule Take 50,000 Units by mouth once a week.     No current facility-administered medications for this visit.     PHYSICAL EXAMINATION: ECOG PERFORMANCE STATUS: 1 - Symptomatic but  completely ambulatory  Vitals:   10/02/20 1031  BP: (!) 122/56  Pulse: 89  Resp: 20  Temp: 98.2 F (36.8 C)  SpO2: 100%   Filed Weights   10/02/20  1031  Weight: 132 lb 4.8 oz (60 kg)    GENERAL:alert, no distress and comfortable. SKIN: skin color, texture, turgor are normal, no rashes or significant lesions EYES: normal, conjunctiva are pink and non-injected, sclera clear NECK: firmness right neck tender to touch LYMPH:  no palpable lymphadenopathy in the cervical, axillary or inguinal LUNGS: scattered rhonchi, HEART: regular rate & rhythm and no murmurs and no lower extremity edema ABDOMEN:abdomen soft, non-tender and normal bowel sounds. Drainage and erythema noted around the PEG tube, no foul smell. Musculoskeletal:no cyanosis of digits and no clubbing  PSYCH: alert & oriented x 3 with fluent speech NEURO: no focal motor/sensory deficits  LABORATORY DATA:  I have reviewed the data as listed Lab Results  Component Value Date   WBC 7.2 10/02/2020   HGB 12.3 (L) 10/02/2020   HCT 35.9 (L) 10/02/2020   MCV 88.9 10/02/2020   PLT 274 10/02/2020     Chemistry      Component Value Date/Time   NA 138 09/25/2020 0723   K 3.6 09/25/2020 0723   CL 98 09/25/2020 0723   CO2 26 09/25/2020 0723   BUN 15 09/25/2020 0723   CREATININE 0.87 09/25/2020 0723   CREATININE 0.61 (L) 10/26/2019 1523      Component Value Date/Time   CALCIUM 9.8 09/25/2020 0723   ALKPHOS 41 01/03/2020 1355   AST 17 01/03/2020 1355   AST 60 (H) 05/04/2019 1207   ALT 14 01/03/2020 1355   ALT 53 (H) 09/06/2019 1543   BILITOT 0.5 01/03/2020 1355   BILITOT 0.5 05/04/2019 1207       RADIOGRAPHIC STUDIES: I have personally reviewed the radiological images as listed and agreed with the findings in the report. IR Gastrostomy Tube  Result Date: 09/24/2020 INDICATION: 70 year old with head neck cancer and scheduled for chemotherapy and radiation. Request for Port-A-Cath and gastrostomy tube placement.  Port-A-Cath was placed immediately prior to gastrostomy tube placement. EXAM: PERCUTANEOUS GASTROSTOMY TUBE WITH FLUOROSCOPIC GUIDANCE Physician: Stephan Minister. Anselm Pancoast, MD MEDICATIONS: Moderate sedation ANESTHESIA/SEDATION: Versed 0.5 mg IV; Fentanyl 25 mcg IV Moderate Sedation Time:  13 minutes The patient was continuously monitored during the procedure by the interventional radiology nurse under my direct supervision. FLUOROSCOPY TIME:  Fluoroscopy Time: 2 minutes, 42 seconds, 8 mGy COMPLICATIONS: None immediate. PROCEDURE: The procedure was explained to the patient. The risks and benefits of the procedure were discussed and the patient's questions were addressed. Informed consent was obtained from the patient. The patient was placed on the interventional table. Fluoroscopy demonstrated oral contrast in the transverse colon. An orogastric tube was placed with fluoroscopic guidance. The anterior abdomen was prepped and draped in sterile fashion. Maximal barrier sterile technique was utilized including caps, mask, sterile gowns, sterile gloves, sterile drape, hand hygiene and skin antiseptic. Stomach was inflated with air through the orogastric tube. The skin and subcutaneous tissues were anesthetized with 1% lidocaine. A 17 gauge needle was directed into the distended stomach with fluoroscopic guidance. A wire was advanced into the stomach and a T-tact was deployed. A 9-French vascular sheath was placed and the orogastric tube was snared using a Gooseneck snare device. The orogastric tube and snare were pulled out of the patient's mouth. The snare device was connected to a 20-French gastrostomy tube. The snare device and gastrostomy tube were pulled through the patient's mouth and out the anterior abdominal wall. The gastrostomy tube was cut to an appropriate length. Contrast injection through gastrostomy tube confirmed placement within the stomach. Fluoroscopic images were obtained  for documentation. The gastrostomy tube  was flushed with normal saline. IMPRESSION: Successful fluoroscopic guided percutaneous gastrostomy tube placement. Electronically Signed   By: [name] M.D.   On: 09/24/2020 17:29   IR IMAGING GUIDED PORT INSERTION  Result Date: 09/24/2020 INDICATION: 70 year old with squamous cell carcinoma head and neck cancer. Request for Port-A-Cath and gastrostomy tube placement. EXAM: FLUOROSCOPIC AND ULTRASOUND GUIDED PLACEMENT OF A SUBCUTANEOUS PORT. Physician: Stephan Minister. [name], MD MEDICATIONS: Ancef 2 g ANESTHESIA/SEDATION: Versed 1.0 mg IV; Fentanyl 50 mcg IV; Moderate Sedation Time:  38 minutes The patient was continuously monitored during the procedure by the interventional radiology nurse under my direct supervision. FLUOROSCOPY TIME:  1 minutes, 30 seconds, 7 mGy COMPLICATIONS: None immediate. PROCEDURE: The procedure was explained to the patient. The risks and benefits of the procedure were discussed and the patient's questions were addressed. Informed consent was obtained from the patient. Patient was placed supine on the interventional table. Right internal jugular vein was not identified with ultrasound. Ultrasound confirmed a patent left internal jugular vein. Ultrasound image was saved for documentation. The left chest and neck were cleaned with a skin antiseptic and a sterile drape was placed. Maximal barrier sterile technique was utilized including caps, mask, sterile gowns, sterile gloves, sterile drape, hand hygiene and skin antiseptic. The left neck was anesthetized with 1% lidocaine. Small incision was made in the left neck with a blade. Micropuncture set was placed in the left IJ with ultrasound guidance. The left chest was anesthetized with 1% lidocaine with epinephrine. #15 blade was used to make an incision and a subcutaneous port pocket was formed. [address] was selected. Subcutaneous tunnel was formed with a stiff tunneling device. The port catheter was brought through the subcutaneous  tunnel. The micropuncture set was exchanged for a peel-away sheath. The catheter was placed through the peel-away sheath and the tip was positioned at the superior cavoatrial junction. The catheter was cut to the appropriate length and attached to the port. The port was placed in the subcutaneous pocket. Catheter placement was confirmed with fluoroscopy. The port was accessed and flushed with heparinized saline. The port pocket was closed using two layers of absorbable sutures and Dermabond. The vein skin site was closed using a single layer of absorbable suture and Dermabond. Sterile dressings were applied. Patient tolerated the procedure well without an immediate complication. Ultrasound and fluoroscopic images were taken and saved for this procedure. IMPRESSION: Placement of a subcutaneous port device. Catheter tip at the superior cavoatrial junction. Electronically Signed   By: [name] M.D.   On: 09/24/2020 17:27    All questions were answered. The patient knows to call the clinic with any problems, questions or concerns.  I spent 30 minutes in the care of this patient including History, review of records, counseling and coordination of care.     [name], MD 10/02/2020 11:10 AM

## 2020-10-03 ENCOUNTER — Ambulatory Visit
Admission: RE | Admit: 2020-10-03 | Discharge: 2020-10-03 | Disposition: A | Discharge status: Home / Self Care | Payer: Medicare HMO | Source: Ambulatory Visit | Attending: Radiation Oncology | Admitting: Radiation Oncology

## 2020-10-03 DIAGNOSIS — C329 Malignant neoplasm of larynx, unspecified: Secondary | ICD-10-CM | POA: Diagnosis not present

## 2020-10-03 NOTE — Progress Notes (Signed)
Oncology Nurse Navigator Documentation  Alan Dike PA with Interventional Radiology came and saw [name] yesterday while he was receiving his chemotherapy in the infusion area at the [facility]. He assessed his PEG tube for infection. He did not feel like there was an infection present and tightened the PEG tube closer to the insertion site to prevent stomach contents from draining on his skin. The enlarged area above the PEG was assessed to be the bottom of his rib cage per [name]. The dressing was changed and [name] was aware of the assessment. I also called his caregiver, [name] with [name]' permission and informed her of the above. They both know to call me if they have any further questions or concerns.   Harlow Asa RN, BSN, OCN Head & Neck Oncology Nurse [place] at [facility] Phone # [phone]  Fax # [phone]

## 2020-10-04 ENCOUNTER — Other Ambulatory Visit: Payer: Self-pay

## 2020-10-04 ENCOUNTER — Ambulatory Visit: Payer: Medicare HMO | Attending: Radiation Oncology

## 2020-10-04 ENCOUNTER — Ambulatory Visit (HOSPITAL_COMMUNITY)
Admission: RE | Admit: 2020-10-04 | Discharge: 2020-10-04 | Disposition: A | Discharge status: Home / Self Care | Payer: Medicare HMO | Source: Ambulatory Visit | Attending: Radiation Oncology | Admitting: Radiation Oncology

## 2020-10-04 ENCOUNTER — Ambulatory Visit: Payer: Medicare HMO | Admitting: Physical Therapy

## 2020-10-04 ENCOUNTER — Ambulatory Visit
Admission: RE | Admit: 2020-10-04 | Discharge: 2020-10-04 | Disposition: A | Discharge status: Home / Self Care | Payer: Medicare HMO | Source: Ambulatory Visit | Attending: Radiation Oncology | Admitting: Radiation Oncology

## 2020-10-04 DIAGNOSIS — R1310 Dysphagia, unspecified: Secondary | ICD-10-CM | POA: Diagnosis not present

## 2020-10-04 DIAGNOSIS — C321 Malignant neoplasm of supraglottis: Secondary | ICD-10-CM | POA: Diagnosis present

## 2020-10-04 DIAGNOSIS — C329 Malignant neoplasm of larynx, unspecified: Secondary | ICD-10-CM | POA: Diagnosis not present

## 2020-10-04 NOTE — Therapy (Signed)
[facility] [address] [place], [place], [address] Phone: [phone]   Fax:  [phone]  Patient Details  Name: [name] MRN: [id number] Date of Birth: 01-May-1951 Referring Provider:  [name], MD  Encounter Date: 10/04/2020    ST Arrived-Cancel  Pt was asked if he would like to see SLP for ST/swallow eval to receive dysphagia exercises following his modified exam  (MBSS) today and he politely declined. He will be scheduled by RN navigator for a subsequent head/neck MDC.    [facility] ,[place], [place]  10/04/2020, 3:40 PM  [facility] [address] [place], [place], [address] Phone: [phone]   Fax:  [phone]

## 2020-10-05 ENCOUNTER — Ambulatory Visit
Admission: RE | Admit: 2020-10-05 | Discharge: 2020-10-05 | Disposition: A | Discharge status: Home / Self Care | Payer: Medicare HMO | Source: Ambulatory Visit | Attending: Radiation Oncology | Admitting: Radiation Oncology

## 2020-10-05 ENCOUNTER — Other Ambulatory Visit: Payer: Self-pay

## 2020-10-05 DIAGNOSIS — C329 Malignant neoplasm of larynx, unspecified: Secondary | ICD-10-CM | POA: Diagnosis not present

## 2020-10-08 ENCOUNTER — Ambulatory Visit
Admission: RE | Admit: 2020-10-08 | Discharge: 2020-10-08 | Disposition: A | Discharge status: Home / Self Care | Payer: Medicare HMO | Source: Ambulatory Visit | Attending: Radiation Oncology | Admitting: Radiation Oncology

## 2020-10-08 ENCOUNTER — Other Ambulatory Visit: Payer: Self-pay

## 2020-10-08 DIAGNOSIS — C329 Malignant neoplasm of larynx, unspecified: Secondary | ICD-10-CM | POA: Diagnosis not present

## 2020-10-09 ENCOUNTER — Ambulatory Visit
Admission: RE | Admit: 2020-10-09 | Discharge: 2020-10-09 | Disposition: A | Discharge status: Home / Self Care | Payer: Medicare HMO | Source: Ambulatory Visit | Attending: Radiation Oncology | Admitting: Radiation Oncology

## 2020-10-09 ENCOUNTER — Telehealth: Payer: Self-pay

## 2020-10-09 ENCOUNTER — Other Ambulatory Visit: Payer: Self-pay

## 2020-10-09 ENCOUNTER — Inpatient Hospital Stay: Payer: Medicare HMO

## 2020-10-09 ENCOUNTER — Encounter: Payer: Self-pay | Admitting: Hematology and Oncology

## 2020-10-09 ENCOUNTER — Inpatient Hospital Stay: Payer: Medicare HMO | Admitting: Nutrition

## 2020-10-09 ENCOUNTER — Inpatient Hospital Stay (HOSPITAL_BASED_OUTPATIENT_CLINIC_OR_DEPARTMENT_OTHER): Payer: Medicare HMO | Admitting: Hematology and Oncology

## 2020-10-09 VITALS — BP 116/77 | HR 102 | Temp 98.1°F | Resp 18 | Ht 68.0 in | Wt 131.0 lb

## 2020-10-09 VITALS — HR 96

## 2020-10-09 DIAGNOSIS — C090 Malignant neoplasm of tonsillar fossa: Secondary | ICD-10-CM

## 2020-10-09 DIAGNOSIS — C329 Malignant neoplasm of larynx, unspecified: Secondary | ICD-10-CM | POA: Diagnosis not present

## 2020-10-09 DIAGNOSIS — Z5111 Encounter for antineoplastic chemotherapy: Secondary | ICD-10-CM | POA: Diagnosis not present

## 2020-10-09 LAB — BASIC METABOLIC PANEL
Anion gap: 8 (ref 5–15)
BUN: 14 mg/dL (ref 8–23)
CO2: 29 mmol/L (ref 22–32)
Calcium: 9 mg/dL (ref 8.9–10.3)
Chloride: 99 mmol/L (ref 98–111)
Creatinine, Ser: 0.65 mg/dL (ref 0.61–1.24)
GFR, Estimated: >60 mL/min (ref >60)
Glucose, Bld: 144 mg/dL — ABNORMAL HIGH (ref 70–99)
Potassium: 3.9 mmol/L (ref 3.5–5.1)
Sodium: 136 mmol/L (ref 135–145)

## 2020-10-09 LAB — CBC WITH DIFFERENTIAL/PLATELET
Abs Immature Granulocytes: 0.04 10*3/uL (ref 0.00–0.07)
Basophils Absolute: 0.1 10*3/uL (ref 0.0–0.1)
Basophils Relative: 1 %
Eosinophils Absolute: 0.1 10*3/uL (ref 0.0–0.5)
Eosinophils Relative: 1 %
HCT: 33.2 % — ABNORMAL LOW (ref 39.0–52.0)
Hemoglobin: 11.3 g/dL — ABNORMAL LOW (ref 13.0–17.0)
Immature Granulocytes: 1 %
Lymphocytes Relative: 9 %
Lymphs Abs: 0.6 10*3/uL — ABNORMAL LOW (ref 0.7–4.0)
MCH: 30.8 pg (ref 26.0–34.0)
MCHC: 34 g/dL (ref 30.0–36.0)
MCV: 90.5 fL (ref 80.0–100.0)
Monocytes Absolute: 0.6 10*3/uL (ref 0.1–1.0)
Monocytes Relative: 10 %
Neutro Abs: 4.6 10*3/uL (ref 1.7–7.7)
Neutrophils Relative %: 78 %
Platelets: 214 10*3/uL (ref 150–400)
RBC: 3.67 MIL/uL — ABNORMAL LOW (ref 4.22–5.81)
RDW: 12 % (ref 11.5–15.5)
WBC: 5.9 10*3/uL (ref 4.0–10.5)
nRBC: 0 % (ref 0.0–0.2)

## 2020-10-09 LAB — MAGNESIUM: Magnesium: 1.7 mg/dL (ref 1.7–2.4)

## 2020-10-09 MED ORDER — MAGNESIUM SULFATE 2 GM/50ML IV SOLN
2.0000 g | Freq: Once | INTRAVENOUS | Status: AC
  Administered 2020-10-09: 2 g via INTRAVENOUS

## 2020-10-09 MED ORDER — POTASSIUM CHLORIDE IN NACL 20-0.9 MEQ/L-% IV SOLN
Freq: Once | INTRAVENOUS | Status: AC
  Filled 2020-10-09: qty 1000

## 2020-10-09 MED ORDER — SODIUM CHLORIDE 0.9% FLUSH
10.0000 mL | INTRAVENOUS | Status: DC | PRN
  Administered 2020-10-09: 10 mL
  Filled 2020-10-09: qty 10

## 2020-10-09 MED ORDER — MAGNESIUM SULFATE 2 GM/50ML IV SOLN
INTRAVENOUS | Status: AC
  Filled 2020-10-09: qty 50

## 2020-10-09 MED ORDER — PALONOSETRON HCL INJECTION 0.25 MG/5ML
0.2500 mg | Freq: Once | INTRAVENOUS | Status: AC
  Administered 2020-10-09: 0.25 mg via INTRAVENOUS

## 2020-10-09 MED ORDER — SODIUM CHLORIDE 0.9 % IV SOLN
10.0000 mg | Freq: Once | INTRAVENOUS | Status: AC
  Administered 2020-10-09: 10 mg via INTRAVENOUS
  Filled 2020-10-09: qty 10

## 2020-10-09 MED ORDER — SODIUM CHLORIDE 0.9 % IV SOLN
150.0000 mg | Freq: Once | INTRAVENOUS | Status: AC
  Administered 2020-10-09: 150 mg via INTRAVENOUS
  Filled 2020-10-09: qty 150

## 2020-10-09 MED ORDER — HEPARIN SOD (PORK) LOCK FLUSH 100 UNIT/ML IV SOLN
500.0000 [IU] | Freq: Once | INTRAVENOUS | Status: AC | PRN
  Administered 2020-10-09: 500 [IU]
  Filled 2020-10-09: qty 5

## 2020-10-09 MED ORDER — SODIUM CHLORIDE 0.9 % IV SOLN
Freq: Once | INTRAVENOUS | Status: AC
  Filled 2020-10-09: qty 250

## 2020-10-09 MED ORDER — PALONOSETRON HCL INJECTION 0.25 MG/5ML
INTRAVENOUS | Status: AC
  Filled 2020-10-09: qty 5

## 2020-10-09 MED ORDER — SODIUM CHLORIDE 0.9 % IV SOLN
40.0000 mg/m2 | Freq: Once | INTRAVENOUS | Status: AC
  Administered 2020-10-09: 68 mg via INTRAVENOUS
  Filled 2020-10-09: qty 68

## 2020-10-09 NOTE — Progress Notes (Signed)
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 Cup;Straw Medication Administration Via alternative means Compensations Follow solids with liquid;Multiple dry swallows after each bite/sip;Clear throat intermittently;Hard cough after swallow;Effortful swallow Postural Changes Remain semi-upright after after feeds/meals (Comment);Seated upright at 90 degrees   CHL IP OTHER RECOMMENDATIONS 10/04/2020 Recommended Consults Other (Comment) Oral Care Recommendations -- Other Recommendations --   CHL IP FOLLOW UP RECOMMENDATIONS 10/04/2020 Follow up Recommendations Outpatient SLP   No flowsheet data found.     CHL IP ORAL PHASE 10/04/2020 Oral Phase WFL Oral - Pudding Teaspoon -- Oral - Pudding Cup -- Oral - Honey Teaspoon -- Oral - Honey Cup -- Oral - Nectar Teaspoon -- Oral - Nectar Cup -- Oral - Nectar Straw -- Oral - Thin Teaspoon -- Oral - Thin Cup -- Oral - Thin Straw -- Oral - Puree -- Oral -  Mech Soft -- Oral - Regular -- Oral - Multi-Consistency -- Oral - Pill -- Oral Phase - Comment --  CHL IP PHARYNGEAL PHASE 10/04/2020 Pharyngeal  Phase Impaired Pharyngeal- Pudding Teaspoon -- Pharyngeal -- Pharyngeal- Pudding Cup -- Pharyngeal -- Pharyngeal- Honey Teaspoon -- Pharyngeal -- Pharyngeal- Honey Cup -- Pharyngeal -- Pharyngeal- Nectar Teaspoon -- Pharyngeal -- Pharyngeal- Nectar Cup Reduced pharyngeal peristalsis;Reduced epiglottic inversion;Reduced anterior laryngeal mobility;Reduced laryngeal elevation;Reduced airway/laryngeal closure;Pharyngeal residue - valleculae;Pharyngeal residue - pyriform;Pharyngeal residue - posterior pharnyx;Pharyngeal residue - cp segment;Inter-arytenoid space residue;Lateral channel residue;Reduced tongue base retraction;Penetration/Aspiration during swallow;Penetration/Apiration after swallow Pharyngeal Material enters airway, remains ABOVE vocal cords and not ejected out;Material enters airway, passes BELOW cords without attempt by patient to eject out (silent aspiration) Pharyngeal- Nectar Straw -- Pharyngeal -- Pharyngeal- Thin Teaspoon -- Pharyngeal -- Pharyngeal- Thin Cup Reduced pharyngeal peristalsis;Reduced epiglottic inversion;Reduced anterior laryngeal mobility;Reduced laryngeal elevation;Reduced airway/laryngeal closure;Reduced tongue base retraction;Penetration/Aspiration during swallow;Penetration/Apiration after swallow;Trace aspiration Pharyngeal Material enters airway, passes BELOW cords then ejected out Pharyngeal- Thin Straw Delayed swallow initiation-vallecula;Delayed swallow initiation-pyriform sinuses;Reduced pharyngeal peristalsis;Reduced epiglottic inversion;Reduced anterior laryngeal mobility;Reduced laryngeal elevation;Reduced airway/laryngeal closure;Reduced tongue base retraction;Pharyngeal residue - valleculae;Pharyngeal residue - pyriform;Pharyngeal residue - posterior pharnyx;Pharyngeal residue - cp segment;Inter-arytenoid space residue;Lateral channel residue Pharyngeal Material enters airway, remains ABOVE vocal cords and not ejected out Pharyngeal- Puree Reduced pharyngeal  peristalsis;Reduced epiglottic inversion;Reduced anterior laryngeal mobility;Reduced laryngeal elevation;Reduced airway/laryngeal closure;Reduced tongue base retraction;Pharyngeal residue - valleculae Pharyngeal Material does not enter airway Pharyngeal- Mechanical Soft Reduced pharyngeal peristalsis;Reduced epiglottic inversion;Reduced anterior laryngeal mobility;Reduced laryngeal elevation;Reduced airway/laryngeal closure;Reduced tongue base retraction;Pharyngeal residue - valleculae Pharyngeal Material does not enter airway Pharyngeal- Regular -- Pharyngeal -- Pharyngeal- Multi-consistency -- Pharyngeal -- Pharyngeal- Pill -- Pharyngeal -- Pharyngeal Comment various postures including head turn right, chin tuck and strategies such as effortful swallow, "expectoration" attempted,  CHL IP CERVICAL ESOPHAGEAL PHASE 10/04/2020 Cervical Esophageal Phase Impaired Pudding Teaspoon -- Pudding Cup -- Honey Teaspoon -- Honey Cup -- Nectar Teaspoon -- Nectar Cup -- Nectar Straw -- Thin Teaspoon -- Thin Cup -- Thin Straw -- Puree -- Mechanical Soft -- Regular -- Multi-consistency -- Pill -- Cervical Esophageal Comment decreased clearance into cervical esophagus results in retention of liquids that is largely cleared with dry swallows Macario Golds 10/04/2020, 4:34 PM    Kathleen Lime, MS [place] Office [phone] Pager [phone]         CLINICAL DATA:  Dysphagia. Prior recurrent head neck cancer. EXAM: MODIFIED BARIUM SWALLOW TECHNIQUE: Different consistencies of barium were administered orally to the patient by the Speech Pathologist. Imaging of the pharynx was performed in the lateral projection. The radiologist was present in the fluoroscopy room for this study, providing personal supervision. FLUOROSCOPY TIME:  Fluoroscopy Time:  3 minutes and 6 seconds Radiation Exposure Index (if provided by the fluoroscopic device): 9.5 mGy Number of Acquired Spot Images: 0 COMPARISON:  None. FINDINGS: With  thin liquids, there is small volume aspiration. With thin liquids with cracker, there is post swallow retention. With nectar thickness, there is small volume/trace aspiration. With puree thickness, there is post swallow retention. IMPRESSION: Swallow dysfunction, as above. Please refer to the Speech Pathologists report for complete details and recommendations. Electronically Signed   By: Abigail Miyamoto M.D.   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 Request for Port-A-Cath and gastrostomy tube placement. EXAM: FLUOROSCOPIC AND ULTRASOUND GUIDED PLACEMENT OF A SUBCUTANEOUS PORT.  Physician: Stephan Minister. [name], MD MEDICATIONS: Ancef 2 g ANESTHESIA/SEDATION: Versed 1.0 mg IV; Fentanyl 50 mcg IV; Moderate Sedation Time:  38 minutes The patient was continuously monitored during the procedure by the interventional radiology nurse under my direct supervision. FLUOROSCOPY TIME:  1 minutes, 30 seconds, 7 mGy COMPLICATIONS: None immediate. PROCEDURE: The procedure was explained to the patient. The risks and benefits of the procedure were discussed and the patient's questions were addressed. Informed consent was obtained from the patient. Patient was placed supine on the interventional table. Right internal jugular vein was not identified with ultrasound. Ultrasound confirmed a patent left internal jugular vein. Ultrasound image was saved for documentation. The left chest and neck were cleaned with a skin antiseptic and a sterile drape was placed. Maximal barrier sterile technique was utilized including caps, mask, sterile gowns, sterile gloves, sterile drape, hand hygiene and skin antiseptic. The left neck was anesthetized with 1% lidocaine. Small incision was made in the left neck with a blade. Micropuncture set was placed in the left IJ with ultrasound guidance. The left chest was anesthetized with 1% lidocaine with  epinephrine. #15 blade was used to make an incision and a subcutaneous port pocket was formed. [place] was selected. Subcutaneous tunnel was formed with a stiff tunneling device. The port catheter was brought through the subcutaneous tunnel. The micropuncture set was exchanged for a peel-away sheath. The catheter was placed through the peel-away sheath and the tip was positioned at the superior cavoatrial junction. The catheter was cut to the appropriate length and attached to the port. The port was placed in the subcutaneous pocket. Catheter placement was confirmed with fluoroscopy. The port was accessed and flushed with heparinized saline. The port pocket was closed using two layers of absorbable sutures and Dermabond. The vein skin site was closed using a single layer of absorbable suture and Dermabond. Sterile dressings were applied. Patient tolerated the procedure well without an immediate complication. Ultrasound and fluoroscopic images were taken and saved for this procedure. IMPRESSION: Placement of a subcutaneous port device. Catheter tip at the superior cavoatrial junction. Electronically Signed   By: [name] M.D.   On: 09/24/2020 17:27    All questions were answered. The patient knows to call the clinic with any problems, questions or concerns.  I spent 30 minutes in the care of this patient including History, review of records, counseling and coordination of care.     [name], MD 10/09/2020 9:54 AM

## 2020-10-09 NOTE — Patient Instructions (Signed)
[facility] Discharge Instructions for Patients Receiving Chemotherapy  Today you received the following chemotherapy agents Cisplatin  To help prevent nausea and vomiting after your treatment, we encourage you to take your nausea medication as directed  If you develop nausea and vomiting that is not controlled by your nausea medication, call the clinic.   BELOW ARE SYMPTOMS THAT SHOULD BE REPORTED IMMEDIATELY:  *FEVER GREATER THAN 100.5 F  *CHILLS WITH OR WITHOUT FEVER  NAUSEA AND VOMITING THAT IS NOT CONTROLLED WITH YOUR NAUSEA MEDICATION  *UNUSUAL SHORTNESS OF BREATH  *UNUSUAL BRUISING OR BLEEDING  TENDERNESS IN MOUTH AND THROAT WITH OR WITHOUT PRESENCE OF ULCERS  *URINARY PROBLEMS  *BOWEL PROBLEMS  UNUSUAL RASH Items with * indicate a potential emergency and should be followed up as soon as possible.  Feel free to call the clinic should you have any questions or concerns. The clinic phone number is [phone].  Please show the CHEMO ALERT CARD at check-in to the Emergency Department and triage nurse.   

## 2020-10-09 NOTE — Progress Notes (Signed)
Nutrition follow-up completed with patient during infusion for recurrent laryngeal cancer. Patient reports some nausea however he has not taken any nausea medication.  He denies vomiting. Reports he is using 6 cartons of Osmolite 1.5 (lactose free) via feeding tube along with free water flushes providing 2130 cal, 89.4 g protein, 2352 mL free water. Patient reports he had one soft stool this morning but denies diarrhea.  To note on last visit patient reported constipation. He has plenty of tube feeding supplies and formula. Weight documented as 131 pounds January 25 which is stable overall. Noted labs: Glucose 144 magnesium 1.7.  Estimated nutrition needs: 2000-2200 cal, 80-95 g protein, 2.2 L fluid.  Nutrition diagnosis: Unintentional weight loss is stable.  Intervention: Continue 6 cartons Osmolite 1.5 daily.  Ensure formula given slowly at room temperature. Take nausea medication if experiencing nausea. Monitor consistency and frequency of stool.  Monitoring, evaluation, goals: Patient will work to continue tube feeding at goal rate to promote weight gain/weight stabilization.  Next visit: Tuesday, February 1 during infusion.  **Disclaimer: This note was dictated with voice recognition software. Similar sounding words can inadvertently be transcribed and this note may contain transcription errors which may not have been corrected upon publication of note.**

## 2020-10-09 NOTE — Telephone Encounter (Signed)
I spoke with [name] today to go over how the Patient should be taking his Dexamethasone, Ondansetron, Compazine, and Ativan. [name] verbalized understanding after going over the directions with her several times. It was clear to me that [name] understood how to administer the medication to the patient after chemotherapy and there afterwards. [name] explained to me that the Patient lives with his sister and they both have memory issues. [name] goes to the Patients house to explain how he should be taking his medication and she lays out the medication in a pill box for him. She states that even after doing this, when she returns to the Patients house she finds that he has still not taken the medication as prescribed. I advised [name] to do the very best that she could do and to encourage the patient and the patients sister to take the medication as prescribed in order to help reduce inflammation, nausea, and vomiting.

## 2020-10-10 ENCOUNTER — Other Ambulatory Visit: Payer: Self-pay

## 2020-10-10 ENCOUNTER — Ambulatory Visit
Admission: RE | Admit: 2020-10-10 | Discharge: 2020-10-10 | Disposition: A | Discharge status: Home / Self Care | Payer: Medicare HMO | Source: Ambulatory Visit | Attending: Radiation Oncology | Admitting: Radiation Oncology

## 2020-10-10 DIAGNOSIS — C329 Malignant neoplasm of larynx, unspecified: Secondary | ICD-10-CM | POA: Diagnosis not present

## 2020-10-11 ENCOUNTER — Other Ambulatory Visit: Payer: Self-pay

## 2020-10-11 ENCOUNTER — Ambulatory Visit
Admission: RE | Admit: 2020-10-11 | Discharge: 2020-10-11 | Disposition: A | Discharge status: Home / Self Care | Payer: Medicare HMO | Source: Ambulatory Visit | Attending: Radiation Oncology | Admitting: Radiation Oncology

## 2020-10-11 DIAGNOSIS — C329 Malignant neoplasm of larynx, unspecified: Secondary | ICD-10-CM | POA: Diagnosis not present

## 2020-10-12 ENCOUNTER — Other Ambulatory Visit: Payer: Self-pay

## 2020-10-12 ENCOUNTER — Ambulatory Visit
Admission: RE | Admit: 2020-10-12 | Discharge: 2020-10-12 | Disposition: A | Discharge status: Home / Self Care | Payer: Medicare HMO | Source: Ambulatory Visit | Attending: Radiation Oncology | Admitting: Radiation Oncology

## 2020-10-12 DIAGNOSIS — C329 Malignant neoplasm of larynx, unspecified: Secondary | ICD-10-CM | POA: Diagnosis not present

## 2020-10-15 ENCOUNTER — Ambulatory Visit
Admission: RE | Admit: 2020-10-15 | Discharge: 2020-10-15 | Disposition: A | Discharge status: Home / Self Care | Payer: Medicare HMO | Source: Ambulatory Visit | Attending: Radiation Oncology | Admitting: Radiation Oncology

## 2020-10-15 DIAGNOSIS — C329 Malignant neoplasm of larynx, unspecified: Secondary | ICD-10-CM | POA: Diagnosis not present

## 2020-10-16 ENCOUNTER — Inpatient Hospital Stay: Payer: Medicare HMO | Attending: Hematology and Oncology

## 2020-10-16 ENCOUNTER — Inpatient Hospital Stay: Payer: Medicare HMO

## 2020-10-16 ENCOUNTER — Inpatient Hospital Stay: Payer: Medicare HMO | Admitting: Nutrition

## 2020-10-16 ENCOUNTER — Encounter: Payer: Self-pay | Admitting: Hematology and Oncology

## 2020-10-16 ENCOUNTER — Ambulatory Visit
Admission: RE | Admit: 2020-10-16 | Discharge: 2020-10-16 | Disposition: A | Discharge status: Home / Self Care | Payer: Medicare HMO | Source: Ambulatory Visit | Attending: Radiation Oncology | Admitting: Radiation Oncology

## 2020-10-16 ENCOUNTER — Other Ambulatory Visit: Payer: Self-pay

## 2020-10-16 ENCOUNTER — Inpatient Hospital Stay (HOSPITAL_BASED_OUTPATIENT_CLINIC_OR_DEPARTMENT_OTHER): Payer: Medicare HMO | Admitting: Hematology and Oncology

## 2020-10-16 DIAGNOSIS — Z87891 Personal history of nicotine dependence: Secondary | ICD-10-CM | POA: Diagnosis not present

## 2020-10-16 DIAGNOSIS — D6959 Other secondary thrombocytopenia: Secondary | ICD-10-CM | POA: Diagnosis not present

## 2020-10-16 DIAGNOSIS — K1231 Oral mucositis (ulcerative) due to antineoplastic therapy: Secondary | ICD-10-CM | POA: Insufficient documentation

## 2020-10-16 DIAGNOSIS — H9201 Otalgia, right ear: Secondary | ICD-10-CM | POA: Diagnosis not present

## 2020-10-16 DIAGNOSIS — G893 Neoplasm related pain (acute) (chronic): Secondary | ICD-10-CM | POA: Diagnosis not present

## 2020-10-16 DIAGNOSIS — C090 Malignant neoplasm of tonsillar fossa: Secondary | ICD-10-CM

## 2020-10-16 DIAGNOSIS — D6481 Anemia due to antineoplastic chemotherapy: Secondary | ICD-10-CM | POA: Diagnosis not present

## 2020-10-16 DIAGNOSIS — Z5111 Encounter for antineoplastic chemotherapy: Secondary | ICD-10-CM | POA: Insufficient documentation

## 2020-10-16 DIAGNOSIS — T451X5A Adverse effect of antineoplastic and immunosuppressive drugs, initial encounter: Secondary | ICD-10-CM | POA: Diagnosis not present

## 2020-10-16 DIAGNOSIS — R112 Nausea with vomiting, unspecified: Secondary | ICD-10-CM | POA: Insufficient documentation

## 2020-10-16 DIAGNOSIS — Z79891 Long term (current) use of opiate analgesic: Secondary | ICD-10-CM | POA: Insufficient documentation

## 2020-10-16 DIAGNOSIS — K521 Toxic gastroenteritis and colitis: Secondary | ICD-10-CM | POA: Insufficient documentation

## 2020-10-16 DIAGNOSIS — C329 Malignant neoplasm of larynx, unspecified: Secondary | ICD-10-CM | POA: Insufficient documentation

## 2020-10-16 LAB — BASIC METABOLIC PANEL
Anion gap: 5 (ref 5–15)
BUN: 13 mg/dL (ref 8–23)
CO2: 29 mmol/L (ref 22–32)
Calcium: 8.8 mg/dL — ABNORMAL LOW (ref 8.9–10.3)
Chloride: 100 mmol/L (ref 98–111)
Creatinine, Ser: 0.64 mg/dL (ref 0.61–1.24)
GFR, Estimated: >60 mL/min (ref >60)
Glucose, Bld: 125 mg/dL — ABNORMAL HIGH (ref 70–99)
Potassium: 4.2 mmol/L (ref 3.5–5.1)
Sodium: 134 mmol/L — ABNORMAL LOW (ref 135–145)

## 2020-10-16 LAB — CBC WITH DIFFERENTIAL/PLATELET
Abs Immature Granulocytes: 0.04 10*3/uL (ref 0.00–0.07)
Basophils Absolute: 0 10*3/uL (ref 0.0–0.1)
Basophils Relative: 1 %
Eosinophils Absolute: 0.1 10*3/uL (ref 0.0–0.5)
Eosinophils Relative: 2 %
HCT: 31 % — ABNORMAL LOW (ref 39.0–52.0)
Hemoglobin: 10.4 g/dL — ABNORMAL LOW (ref 13.0–17.0)
Immature Granulocytes: 1 %
Lymphocytes Relative: 14 %
Lymphs Abs: 0.5 10*3/uL — ABNORMAL LOW (ref 0.7–4.0)
MCH: 30.5 pg (ref 26.0–34.0)
MCHC: 33.5 g/dL (ref 30.0–36.0)
MCV: 90.9 fL (ref 80.0–100.0)
Monocytes Absolute: 0.3 10*3/uL (ref 0.1–1.0)
Monocytes Relative: 9 %
Neutro Abs: 2.8 10*3/uL (ref 1.7–7.7)
Neutrophils Relative %: 73 %
Platelets: 157 10*3/uL (ref 150–400)
RBC: 3.41 MIL/uL — ABNORMAL LOW (ref 4.22–5.81)
RDW: 12.2 % (ref 11.5–15.5)
WBC: 3.7 10*3/uL — ABNORMAL LOW (ref 4.0–10.5)
nRBC: 0 % (ref 0.0–0.2)

## 2020-10-16 LAB — MAGNESIUM: Magnesium: 1.7 mg/dL (ref 1.7–2.4)

## 2020-10-16 MED ORDER — SODIUM CHLORIDE 0.9 % IV SOLN
Freq: Once | INTRAVENOUS | Status: AC
  Filled 2020-10-16: qty 250

## 2020-10-16 MED ORDER — SODIUM CHLORIDE 0.9 % IV SOLN
150.0000 mg | Freq: Once | INTRAVENOUS | Status: AC
  Administered 2020-10-16: 150 mg via INTRAVENOUS
  Filled 2020-10-16: qty 150

## 2020-10-16 MED ORDER — SODIUM CHLORIDE 0.9% FLUSH
10.0000 mL | INTRAVENOUS | Status: DC | PRN
  Administered 2020-10-16: 10 mL
  Filled 2020-10-16: qty 10

## 2020-10-16 MED ORDER — POTASSIUM CHLORIDE IN NACL 20-0.9 MEQ/L-% IV SOLN
Freq: Once | INTRAVENOUS | Status: AC
  Filled 2020-10-16: qty 1000

## 2020-10-16 MED ORDER — MAGNESIUM SULFATE 2 GM/50ML IV SOLN
INTRAVENOUS | Status: AC
  Filled 2020-10-16: qty 50

## 2020-10-16 MED ORDER — PALONOSETRON HCL INJECTION 0.25 MG/5ML
INTRAVENOUS | Status: AC
  Filled 2020-10-16: qty 5

## 2020-10-16 MED ORDER — MAGNESIUM SULFATE 2 GM/50ML IV SOLN
2.0000 g | Freq: Once | INTRAVENOUS | Status: AC
  Administered 2020-10-16: 2 g via INTRAVENOUS

## 2020-10-16 MED ORDER — SODIUM CHLORIDE 0.9 % IV SOLN
40.0000 mg/m2 | Freq: Once | INTRAVENOUS | Status: AC
  Administered 2020-10-16: 68 mg via INTRAVENOUS
  Filled 2020-10-16: qty 68

## 2020-10-16 MED ORDER — SODIUM CHLORIDE 0.9 % IV SOLN
10.0000 mg | Freq: Once | INTRAVENOUS | Status: AC
  Administered 2020-10-16: 10 mg via INTRAVENOUS
  Filled 2020-10-16: qty 10

## 2020-10-16 MED ORDER — LIDOCAINE VISCOUS HCL 2 % MT SOLN: OROMUCOSAL | 3 refills | Status: DC

## 2020-10-16 MED ORDER — HYDROCODONE-ACETAMINOPHEN 7.5-325 MG/15ML PO SOLN: 10.0000 mL | Freq: Four times a day (QID) | ORAL | 0 refills | Status: DC | PRN

## 2020-10-16 MED ORDER — PALONOSETRON HCL INJECTION 0.25 MG/5ML
0.2500 mg | Freq: Once | INTRAVENOUS | Status: AC
  Administered 2020-10-16: 0.25 mg via INTRAVENOUS

## 2020-10-16 MED ORDER — HEPARIN SOD (PORK) LOCK FLUSH 100 UNIT/ML IV SOLN
500.0000 [IU] | Freq: Once | INTRAVENOUS | Status: AC | PRN
  Administered 2020-10-16: 500 [IU]
  Filled 2020-10-16: qty 5

## 2020-10-16 NOTE — Patient Instructions (Signed)

## 2020-10-16 NOTE — Progress Notes (Signed)
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 alternative means Compensations Follow solids with liquid;Multiple dry swallows after each bite/sip;Clear throat intermittently;Hard cough after swallow;Effortful swallow Postural Changes Remain semi-upright after after feeds/meals (Comment);Seated upright at 90 degrees   CHL IP OTHER RECOMMENDATIONS 10/04/2020 Recommended Consults Other (Comment) Oral Care Recommendations -- Other Recommendations --   CHL IP FOLLOW UP RECOMMENDATIONS 10/04/2020 Follow up Recommendations Outpatient SLP   No flowsheet data found.     CHL IP ORAL PHASE 10/04/2020 Oral Phase WFL Oral - Pudding Teaspoon -- Oral - Pudding Cup -- Oral - Honey Teaspoon -- Oral - Honey Cup -- Oral - Nectar Teaspoon -- Oral - Nectar Cup -- Oral - Nectar Straw -- Oral - Thin Teaspoon -- Oral - Thin Cup -- Oral - Thin Straw -- Oral - Puree -- Oral - Mech Soft --  Oral - Regular -- Oral - Multi-Consistency -- Oral - Pill -- Oral Phase - Comment --  CHL IP PHARYNGEAL PHASE 10/04/2020 Pharyngeal Phase Impaired Pharyngeal- Pudding Teaspoon -- Pharyngeal -- Pharyngeal-  Pudding Cup -- Pharyngeal -- Pharyngeal- Honey Teaspoon -- Pharyngeal -- Pharyngeal- Honey Cup -- Pharyngeal -- Pharyngeal- Nectar Teaspoon -- Pharyngeal -- Pharyngeal- Nectar Cup Reduced pharyngeal peristalsis;Reduced epiglottic inversion;Reduced anterior laryngeal mobility;Reduced laryngeal elevation;Reduced airway/laryngeal closure;Pharyngeal residue - valleculae;Pharyngeal residue - pyriform;Pharyngeal residue - posterior pharnyx;Pharyngeal residue - cp segment;Inter-arytenoid space residue;Lateral channel residue;Reduced tongue base retraction;Penetration/Aspiration during swallow;Penetration/Apiration after swallow Pharyngeal Material enters airway, remains ABOVE vocal cords and not ejected out;Material enters airway, passes BELOW cords without attempt by patient to eject out (silent aspiration) Pharyngeal- Nectar Straw -- Pharyngeal -- Pharyngeal- Thin Teaspoon -- Pharyngeal -- Pharyngeal- Thin Cup Reduced pharyngeal peristalsis;Reduced epiglottic inversion;Reduced anterior laryngeal mobility;Reduced laryngeal elevation;Reduced airway/laryngeal closure;Reduced tongue base retraction;Penetration/Aspiration during swallow;Penetration/Apiration after swallow;Trace aspiration Pharyngeal Material enters airway, passes BELOW cords then ejected out Pharyngeal- Thin Straw Delayed swallow initiation-vallecula;Delayed swallow initiation-pyriform sinuses;Reduced pharyngeal peristalsis;Reduced epiglottic inversion;Reduced anterior laryngeal mobility;Reduced laryngeal elevation;Reduced airway/laryngeal closure;Reduced tongue base retraction;Pharyngeal residue - valleculae;Pharyngeal residue - pyriform;Pharyngeal residue - posterior pharnyx;Pharyngeal residue - cp segment;Inter-arytenoid space residue;Lateral channel residue Pharyngeal Material enters airway, remains ABOVE vocal cords and not ejected out Pharyngeal- Puree Reduced pharyngeal peristalsis;Reduced epiglottic inversion;Reduced anterior laryngeal  mobility;Reduced laryngeal elevation;Reduced airway/laryngeal closure;Reduced tongue base retraction;Pharyngeal residue - valleculae Pharyngeal Material does not enter airway Pharyngeal- Mechanical Soft Reduced pharyngeal peristalsis;Reduced epiglottic inversion;Reduced anterior laryngeal mobility;Reduced laryngeal elevation;Reduced airway/laryngeal closure;Reduced tongue base retraction;Pharyngeal residue - valleculae Pharyngeal Material does not enter airway Pharyngeal- Regular -- Pharyngeal -- Pharyngeal- Multi-consistency -- Pharyngeal -- Pharyngeal- Pill -- Pharyngeal -- Pharyngeal Comment various postures including head turn right, chin tuck and strategies such as effortful swallow, "expectoration" attempted,  CHL IP CERVICAL ESOPHAGEAL PHASE 10/04/2020 Cervical Esophageal Phase Impaired Pudding Teaspoon -- Pudding Cup -- Honey Teaspoon -- Honey Cup -- Nectar Teaspoon -- Nectar Cup -- Nectar Straw -- Thin Teaspoon -- Thin Cup -- Thin Straw -- Puree -- Mechanical Soft -- Regular -- Multi-consistency -- Pill -- Cervical Esophageal Comment decreased clearance into cervical esophagus results in retention of liquids that is largely cleared with dry swallows Macario Golds 10/04/2020, 4:34 PM    Kathleen Lime, MS [place] Office [phone] Pager [phone]         CLINICAL DATA:  Dysphagia. Prior recurrent head neck cancer. EXAM: MODIFIED BARIUM SWALLOW TECHNIQUE: Different consistencies of barium were administered orally to the patient by the Speech Pathologist. Imaging of the pharynx was performed in the lateral projection. The radiologist was present in the fluoroscopy room for this study, providing personal supervision. FLUOROSCOPY TIME:  Fluoroscopy Time:  3 minutes and 6 seconds Radiation Exposure Index (if provided by the fluoroscopic device): 9.5 mGy Number of Acquired Spot Images: 0 COMPARISON:  None. FINDINGS: With thin liquids, there is small volume aspiration. With thin liquids with  cracker, there is post swallow retention. With nectar thickness, there is small volume/trace aspiration. With puree thickness, there is post swallow retention. IMPRESSION: Swallow dysfunction, as above. Please refer to the Speech Pathologists report for complete details and recommendations. Electronically Signed   By: Abigail Miyamoto M.D.   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 Request for Port-A-Cath and gastrostomy tube placement. EXAM: FLUOROSCOPIC AND ULTRASOUND GUIDED PLACEMENT OF A SUBCUTANEOUS PORT. Physician: Stephan Minister.  [name], MD MEDICATIONS: Ancef 2 g ANESTHESIA/SEDATION: Versed 1.0 mg IV; Fentanyl 50 mcg IV; Moderate Sedation Time:  38 minutes The patient was continuously monitored during the procedure by the interventional radiology nurse under my direct supervision. FLUOROSCOPY TIME:  1 minutes, 30 seconds, 7 mGy COMPLICATIONS: None immediate. PROCEDURE: The procedure was explained to the patient. The risks and benefits of the procedure were discussed and the patient's questions were addressed. Informed consent was obtained from the patient. Patient was placed supine on the interventional table. Right internal jugular vein was not identified with ultrasound. Ultrasound confirmed a patent left internal jugular vein. Ultrasound image was saved for documentation. The left chest and neck were cleaned with a skin antiseptic and a sterile drape was placed. Maximal barrier sterile technique was utilized including caps, mask, sterile gowns, sterile gloves, sterile drape, hand hygiene and skin antiseptic. The left neck was anesthetized with 1% lidocaine. Small incision was made in the left neck with a blade. Micropuncture set was placed in the left IJ with ultrasound guidance. The left chest was anesthetized with 1% lidocaine with epinephrine. #15 blade was used to make an incision and a subcutaneous  port pocket was formed. [place] was selected. Subcutaneous tunnel was formed with a stiff tunneling device. The port catheter was brought through the subcutaneous tunnel. The micropuncture set was exchanged for a peel-away sheath. The catheter was placed through the peel-away sheath and the tip was positioned at the superior cavoatrial junction. The catheter was cut to the appropriate length and attached to the port. The port was placed in the subcutaneous pocket. Catheter placement was confirmed with fluoroscopy. The port was accessed and flushed with heparinized saline. The port pocket was closed using two layers of absorbable sutures and Dermabond. The vein skin site was closed using a single layer of absorbable suture and Dermabond. Sterile dressings were applied. Patient tolerated the procedure well without an immediate complication. Ultrasound and fluoroscopic images were taken and saved for this procedure. IMPRESSION: Placement of a subcutaneous port device. Catheter tip at the superior cavoatrial junction. Electronically Signed   By: [name] M.D.   On: 09/24/2020 17:27    All questions were answered. The patient knows to call the clinic with any problems, questions or concerns.  I spent 30 minutes in the care of this patient including History, review of records, counseling and coordination of care.     [name], MD 10/16/2020 10:09 AM

## 2020-10-16 NOTE — Progress Notes (Signed)
Nutrition follow-up completed with patient during infusion for recurrent laryngeal cancer. Weight is stable and was documented as 132 pounds February 1. Noted labs: Sodium 134 glucose 125. Patient reports he is tolerating some soft foods such as cream potatoes, fish, and green beans.  Reports drinking 3-4 bottles of Ensure by mouth. Also reports he is using 6 cartons of Osmolite 1.5 via his feeding tube with free water flushes providing 2130 cal, 89.4 g protein, 2352 mL free water. He has occasional nausea and vomiting.  Reports nausea medication is helpful. Reports he has plenty of Osmolite 1.5.  Estimated nutrition needs: 2000-2200 cal, 80-95 g protein, 2.2 L fluid.  Nutrition diagnosis: Unintended weight loss stable.  Intervention: I have encouraged patient to consume oral diet as tolerated.  I am unsure how much he is actually eating and drinking.  If patient were consuming 3-4 cartons of Ensure Plus in addition to food he would be receiving an additional 1050-1400 cal in addition to calories provided by tube feeding.  I would expect to see some weight gain with this amount of calories daily. Continue tube feeding as scheduled.  Tube feeding is meeting 100% estimated nutrition needs. We will continue to monitor and assist patient as needed.  Monitoring, evaluation, goals: We will follow patient weight and tolerance of tube feeding and oral diet.  Next visit: Tuesday, February 8 during infusion.  **Disclaimer: This note was dictated with voice recognition software. Similar sounding words can inadvertently be transcribed and this note may contain transcription errors which may not have been corrected upon publication of note.**

## 2020-10-16 NOTE — Patient Instructions (Signed)
[facility] Discharge Instructions for Patients Receiving Chemotherapy  Today you received the following chemotherapy agents cisplatin  To help prevent nausea and vomiting after your treatment, we encourage you to take your nausea medication as directed   If you develop nausea and vomiting that is not controlled by your nausea medication, call the clinic.   BELOW ARE SYMPTOMS THAT SHOULD BE REPORTED IMMEDIATELY:  *FEVER GREATER THAN 100.5 F  *CHILLS WITH OR WITHOUT FEVER  NAUSEA AND VOMITING THAT IS NOT CONTROLLED WITH YOUR NAUSEA MEDICATION  *UNUSUAL SHORTNESS OF BREATH  *UNUSUAL BRUISING OR BLEEDING  TENDERNESS IN MOUTH AND THROAT WITH OR WITHOUT PRESENCE OF ULCERS  *URINARY PROBLEMS  *BOWEL PROBLEMS  UNUSUAL RASH Items with * indicate a potential emergency and should be followed up as soon as possible.  Feel free to call the clinic should you have any questions or concerns. The clinic phone number is [phone].  Please show the CHEMO ALERT CARD at check-in to the Emergency Department and triage nurse.   

## 2020-10-17 ENCOUNTER — Ambulatory Visit
Admission: RE | Admit: 2020-10-17 | Discharge: 2020-10-17 | Disposition: A | Discharge status: Home / Self Care | Payer: Medicare HMO | Source: Ambulatory Visit | Attending: Radiation Oncology | Admitting: Radiation Oncology

## 2020-10-17 DIAGNOSIS — C090 Malignant neoplasm of tonsillar fossa: Secondary | ICD-10-CM | POA: Diagnosis not present

## 2020-10-18 ENCOUNTER — Encounter: Payer: Self-pay | Admitting: Physical Therapy

## 2020-10-18 ENCOUNTER — Ambulatory Visit: Payer: Medicare HMO | Attending: Radiation Oncology

## 2020-10-18 ENCOUNTER — Other Ambulatory Visit: Payer: Self-pay

## 2020-10-18 ENCOUNTER — Ambulatory Visit
Admission: RE | Admit: 2020-10-18 | Discharge: 2020-10-18 | Disposition: A | Discharge status: Home / Self Care | Payer: Medicare HMO | Source: Ambulatory Visit | Attending: Radiation Oncology | Admitting: Radiation Oncology

## 2020-10-18 ENCOUNTER — Ambulatory Visit: Payer: Medicare HMO | Attending: Radiation Oncology | Admitting: Physical Therapy

## 2020-10-18 DIAGNOSIS — R1313 Dysphagia, pharyngeal phase: Secondary | ICD-10-CM

## 2020-10-18 DIAGNOSIS — C321 Malignant neoplasm of supraglottis: Secondary | ICD-10-CM | POA: Insufficient documentation

## 2020-10-18 DIAGNOSIS — R1319 Other dysphagia: Secondary | ICD-10-CM

## 2020-10-18 DIAGNOSIS — C090 Malignant neoplasm of tonsillar fossa: Secondary | ICD-10-CM | POA: Diagnosis not present

## 2020-10-18 DIAGNOSIS — R293 Abnormal posture: Secondary | ICD-10-CM | POA: Diagnosis not present

## 2020-10-18 NOTE — Progress Notes (Signed)
Oncology Nurse Navigator Documentation  I met with [name] during [facility] today. He is feeling well and denies concerns at this time. I provided him with a packet with information regarding Advanced Directives and Living Will as requested by him. He knows to call me if he has any further needs or concerns.   Harlow Asa RN, BSN, OCN Head & Neck Oncology Nurse [place] at [facility] Phone # [phone]  Fax # [phone]

## 2020-10-18 NOTE — Patient Instructions (Addendum)
   Eat puree/creamy solids and drink Nectar thick liquid (you can get thickening powder at a drug store) Clean your mouth out well, after you eat Drink water throughout the day to improve your swallow strength and coordination  When you eat your meals: Take a bite, then a sip, then a bite, then a sip, bite, sip... Dry swallow once or twice after each bite/sip Swallow hard Hard cough after swallow  Clear throat or "hock" every once in a while   Swallow exercises (provided to pt with simplified language)  1)  Swallow hard  10x  2) Tongue out; hold it with teeth; swallow  10x  3) Swallow and squeeze hard 5 seconds   10x  4)  Hold your breath; "push poop"; swallow; cough   10x  Take ONE DROP of water to do these, if your mouth is dry
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
## 2020-10-18 NOTE — Therapy (Signed)
[place], [place], [address] Phone: [phone]   Fax:  [phone]  Physical Therapy Evaluation  Patient Details  Name: [name] MRN: [id number] Date of Birth: 1951/03/13 Referring Provider (PT): Reita May Date: 10/18/2020   PT End of Session - 10/18/20 [id number]    Visit Number 1    Number of Visits 2    Date for PT Re-Evaluation 12/13/20    PT Start Time 0842    PT Stop Time 0910    PT Time Calculation (min) 28 min    Activity Tolerance Patient tolerated treatment well    Behavior During Therapy [facility] for tasks assessed/performed           Past Medical History:  Diagnosis Date  . Cancer ([place])    Throat cancer 2019  . ETOH abuse   . Frequent urination   . Glaucoma   . Hepatitis C virus infection without hepatic coma    dx'ed in 11/2018  . History of radiation therapy 05/12/19- 07/06/19   Larynx  . Hypertension   . Wears denture    upper only; lost lower denture    Past Surgical History:  Procedure Laterality Date  . ANKLE SURGERY  2011   right ankle  . COLONOSCOPY  02/2019   polyps - Dr Havery Moros  . DIRECT LARYNGOSCOPY N/A 04/01/2019   Procedure: DIRECT LARYNGOSCOPY WITH BIOPSY;  Surgeon: Jodi Marble, MD;  Location: [facility];  Service: ENT;  Laterality: N/A;  . DIRECT LARYNGOSCOPY N/A 01/06/2020   Procedure: DIRECT LARYNGOSCOPY;  Surgeon: Izora Gala, MD;  Location: [facility];  Service: ENT;  Laterality: N/A;  . DIRECT LARYNGOSCOPY N/A 08/13/2020   Procedure: DIRECT LARYNGOSCOPY;  Surgeon: Izora Gala, MD;  Location: [facility];  Service: ENT;  Laterality: N/A;  . ESOPHAGOSCOPY N/A 04/01/2019   Procedure: ESOPHAGOSCOPY;  Surgeon: Jodi Marble, MD;  Location: [facility];  Service: ENT;  Laterality: N/A;  . EXCISION ORAL TUMOR Right 08/13/2020   Procedure: BIOPSY OF OROPHARYNGEAL MASS;  Surgeon: Izora Gala, MD;  Location: [facility];  Service: ENT;   Laterality: Right;  . EYE SURGERY Right   . IR GASTROSTOMY TUBE MOD SED  09/24/2020  . IR IMAGING GUIDED PORT INSERTION  09/24/2020  . KNEE SURGERY    . LARYNGOSCOPY AND BRONCHOSCOPY N/A 04/01/2019   Procedure: BRONCHOSCOPY;  Surgeon: Jodi Marble, MD;  Location: [facility];  Service: ENT;  Laterality: N/A;  . RADICAL NECK DISSECTION N/A 01/06/2020   Procedure: RADICAL NECK DISSECTION;  Surgeon: Izora Gala, MD;  Location: [facility];  Service: ENT;  Laterality: N/A;    There were no vitals filed for this visit.    Subjective Assessment - 10/18/20 0814    Subjective My throat is starting to get sore from the radiation.    Pertinent History poorly differentiated squamous cell carcinoma, treated with radiation in the past and follow up CT neck on 07/05/20 revealed asymmetric soft tissue thickening and enhancement along the right lateral pharynx, 08/13/20- direct laryngoscopy biopsy of oropharyngeal mass revealed SCC, 08/30/20- PET showed recurrence of head and neck carcinomas with broad hypermetabolic pharygeal mucosal lesion including the right lateral posterior oropharynx and hypopharynx, will received radiation to oropharynx and R upper neck which started 09/19/20 all receiving weekly cisplatin starting 09/25/20; quit smoking 2 months ago after approx 50 yrs of use, has had radiation in the past 05/12/19 to 07/06/19 to his larynx and bilateral neck- this is  3rd course of treatment for head and neck cancer    Patient Stated Goals to gain info from provider    Currently in Pain? Yes    Pain Score 6     Pain Location Throat    Pain Orientation Right;Left    Pain Descriptors / Indicators Sharp    Pain Type Acute pain    Pain Onset More than a month ago    Pain Frequency Intermittent    Aggravating Factors  swallow    Pain Relieving Factors take pain meds    Effect of Pain on Daily Activities hard to swallow              [facility] PT Assessment - 10/18/20 0001      Assessment   Medical Diagnosis SCC  of oropharynx    Referring Provider (PT) Isidore Moos    Onset Date/Surgical Date 08/13/20    Hand Dominance Right    Prior Therapy none      Precautions   Precautions Other (comment)    Precaution Comments active cancer      Restrictions   Weight Bearing Restrictions No      Balance Screen   Has the patient fallen in the past 6 months Yes    How many times? 3   pt reports he gets weak and falls, knee gives out   Has the patient had a decrease in activity level because of a fear of falling?  Yes    Is the patient reluctant to leave their home because of a fear of falling?  No      Home Environment   Living Environment Private residence    Living Arrangements Other relatives   sister   Available Help at Discharge Family    Type of [place] One level      Prior Function   Level of [place] Retired    Leisure pt tries to do some walking and occasionally rides a bike      Charity fundraiser Status Within Functional Limits for tasks assessed      Functional Tests   Functional tests Sit to Stand      Sit to Stand   Comments 9 sit to stands in 30 seconds      Posture/Postural Control   Posture/Postural Control Postural limitations    Postural Limitations Rounded Shoulders;Forward head      ROM / Strength   AROM / PROM / Strength AROM      AROM   Overall AROM Comments shoulder ROM WFL    AROM Assessment Site Cervical    Cervical Flexion WFL    Cervical Extension WFL    Cervical - Right Side Bend WFL    Cervical - Left Side Bend WFL    Cervical - Right Rotation WFL    Cervical - Left Rotation WFL      Ambulation/Gait   Ambulation/Gait Yes    Ambulation/Gait Assistance 6: Modified independent (Device/Increase time)    Ambulation Distance (Feet) 10 Feet    Gait Pattern Decreased arm swing - right;Decreased arm swing - left    Ambulation Surface Level              LYMPHEDEMA/ONCOLOGY QUESTIONNAIRE - 10/18/20 0001      Type   Cancer Type SCC of oropharynx      Lymphedema Assessments   Lymphedema Assessments Head and Neck  Head and Neck   4 cm superior to sternal notch around neck 33 cm    6 cm superior to sternal notch around neck 32.4 cm    8 cm superior to sternal notch around neck 32.6 cm                   Objective measurements completed on examination: See above findings.               PT Education - 10/18/20 0916    Education Details Neck ROM, importance of posture when sitting, standing and lying down, deep breathing, walking program and importance of staying active throughout treatment, CURE article on staying active, "Why exercise?" flyer, lymphedema and PT info    Person(s) Educated Patient    Methods Explanation;Handout    Comprehension Verbalized understanding               PT Long Term Goals - 10/18/20 0834      PT LONG TERM GOAL #1   Title Pt will return to baseline ROM measurements and not demonstrate any signs or symptoms of lymphedema.    Time 8    Period Weeks    Status New    Target Date 12/13/20              Head and Neck Clinic Goals - 10/18/20 0834      Patient will be able to verbalize understanding of a home exercise program for cervical range of motion, posture, and walking.    Time 1    Period Days    Status Achieved      Patient will be able to verbalize understanding of proper sitting and standing posture.    Time 1    Period Days    Status Achieved      Patient will be able to verbalize understanding of lymphedema risk and availability of treatment for this condition.    Time 1    Period Days    Status Achieved              Plan - 10/18/20 0911    Clinical Impression Statement Pt reports to head and neck clinic with recently diagnosed squamous cell carcinoma of oropharynx. He is receiving radiation and chemotherapy. He has a previous  history of head and neck cancer and was treated with radition in 2020 with radiation to his larynx and bilateral neck. Pt would benefit from physical therapy now as he is only able to complete 9 sit to stands in 30 sec and has had 3 falls in the past 6 months but pt wants to wait until he finishes treatment. Educated pt about signs and symptoms of lymphedema as well as anatomy and physiology of lymphatic system. Educated pt in importance of staying as active as possible throughout treatment to decrease fatigue as well as head and neck ROM exercises to decrease loss of ROM. Will see pt after completion of radiation to reassess ROM and assess for lymphedema to determine therapy needs at that time.    Personal Factors and Comorbidities Fitness;Comorbidity 1;Transportation    Comorbidities previous hx of head and neck cancer with radiation    Examination-Participation Restrictions Community Activity;Shop    Stability/Clinical Decision Making Evolving/Moderate complexity    Rehab Potential Good    PT Frequency --   eval and 1 f/u visit   PT Duration 8 weeks    PT Treatment/Interventions ADLs/Self Care Home Management;Patient/family education;Therapeutic exercise    PT Next Visit Plan  reassess baslines    PT Home Exercise Plan head and neck ROM exercises    Consulted and Agree with Plan of Care Patient           Patient will benefit from skilled therapeutic intervention in order to improve the following deficits and impairments:  Abnormal gait,Difficulty walking,Postural dysfunction,Pain  Visit Diagnosis: Abnormal posture  Malignant neoplasm of supraglottis [facility])     Problem List Patient Active Problem List   Diagnosis Date Noted  . Cancer of tonsillar fossa ([place]) 08/31/2020  . Laryngeal cancer ([place]) 01/06/2020  . Metastatic cancer to cervical lymph nodes ([place]) 12/01/2019  . Chronic hepatitis C without hepatic coma ([place]) 06/09/2019  . 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 Squamous cell carcinoma of neck 04/01/2019    Allyson Sabal [facility] 10/18/2020, 9:17 AM  [place], [place], [address] Phone: [phone]   Fax:  [phone]  Name: [name] MRN: [id number] Date of Birth: 01-Apr-1951  Allyson Sabal [facility], PT 10/18/20 9:17 AM

## 2020-10-19 ENCOUNTER — Ambulatory Visit
Admission: RE | Admit: 2020-10-19 | Discharge: 2020-10-19 | Disposition: A | Discharge status: Home / Self Care | Payer: Medicare HMO | Source: Ambulatory Visit | Attending: Radiation Oncology | Admitting: Radiation Oncology

## 2020-10-19 ENCOUNTER — Other Ambulatory Visit: Payer: Self-pay

## 2020-10-19 DIAGNOSIS — C090 Malignant neoplasm of tonsillar fossa: Secondary | ICD-10-CM | POA: Diagnosis not present

## 2020-10-22 ENCOUNTER — Other Ambulatory Visit: Payer: Self-pay

## 2020-10-22 ENCOUNTER — Ambulatory Visit
Admission: RE | Admit: 2020-10-22 | Discharge: 2020-10-22 | Disposition: A | Discharge status: Home / Self Care | Payer: Medicare HMO | Source: Ambulatory Visit | Attending: Radiation Oncology | Admitting: Radiation Oncology

## 2020-10-22 DIAGNOSIS — C090 Malignant neoplasm of tonsillar fossa: Secondary | ICD-10-CM | POA: Diagnosis not present

## 2020-10-22 MED ORDER — SONAFINE EX EMUL
1.0000 "application " | Freq: Two times a day (BID) | CUTANEOUS | Status: DC
  Administered 2020-10-22: 1 via TOPICAL

## 2020-10-23 ENCOUNTER — Inpatient Hospital Stay (HOSPITAL_BASED_OUTPATIENT_CLINIC_OR_DEPARTMENT_OTHER): Payer: Medicare HMO | Admitting: Medical

## 2020-10-23 ENCOUNTER — Inpatient Hospital Stay: Payer: Medicare HMO

## 2020-10-23 ENCOUNTER — Other Ambulatory Visit: Payer: Self-pay

## 2020-10-23 ENCOUNTER — Other Ambulatory Visit: Payer: Self-pay | Admitting: Oncology

## 2020-10-23 ENCOUNTER — Inpatient Hospital Stay: Payer: Medicare HMO | Admitting: Nutrition

## 2020-10-23 ENCOUNTER — Ambulatory Visit: Payer: Medicare HMO | Admitting: Hematology and Oncology

## 2020-10-23 ENCOUNTER — Ambulatory Visit
Admission: RE | Admit: 2020-10-23 | Discharge: 2020-10-23 | Disposition: A | Discharge status: Home / Self Care | Payer: Medicare HMO | Source: Ambulatory Visit | Attending: Radiation Oncology | Admitting: Radiation Oncology

## 2020-10-23 VITALS — BP 131/92 | HR 83 | Temp 97.6°F | Resp 17 | Ht 68.0 in | Wt 131.0 lb

## 2020-10-23 DIAGNOSIS — C090 Malignant neoplasm of tonsillar fossa: Secondary | ICD-10-CM | POA: Diagnosis not present

## 2020-10-23 DIAGNOSIS — C329 Malignant neoplasm of larynx, unspecified: Secondary | ICD-10-CM

## 2020-10-23 DIAGNOSIS — Z5111 Encounter for antineoplastic chemotherapy: Secondary | ICD-10-CM | POA: Diagnosis not present

## 2020-10-23 DIAGNOSIS — Z95828 Presence of other vascular implants and grafts: Secondary | ICD-10-CM

## 2020-10-23 LAB — BASIC METABOLIC PANEL
Anion gap: 8 (ref 5–15)
BUN: 14 mg/dL (ref 8–23)
CO2: 28 mmol/L (ref 22–32)
Calcium: 9 mg/dL (ref 8.9–10.3)
Chloride: 99 mmol/L (ref 98–111)
Creatinine, Ser: 0.67 mg/dL (ref 0.61–1.24)
GFR, Estimated: >60 mL/min (ref >60)
Glucose, Bld: 96 mg/dL (ref 70–99)
Potassium: 4.5 mmol/L (ref 3.5–5.1)
Sodium: 135 mmol/L (ref 135–145)

## 2020-10-23 LAB — CBC WITH DIFFERENTIAL/PLATELET
Abs Immature Granulocytes: 0.02 10*3/uL (ref 0.00–0.07)
Basophils Absolute: 0 10*3/uL (ref 0.0–0.1)
Basophils Relative: 1 %
Eosinophils Absolute: 0 10*3/uL (ref 0.0–0.5)
Eosinophils Relative: 1 %
HCT: 32.1 % — ABNORMAL LOW (ref 39.0–52.0)
Hemoglobin: 10.8 g/dL — ABNORMAL LOW (ref 13.0–17.0)
Immature Granulocytes: 1 %
Lymphocytes Relative: 19 %
Lymphs Abs: 0.6 10*3/uL — ABNORMAL LOW (ref 0.7–4.0)
MCH: 30.3 pg (ref 26.0–34.0)
MCHC: 33.6 g/dL (ref 30.0–36.0)
MCV: 90.2 fL (ref 80.0–100.0)
Monocytes Absolute: 0.3 10*3/uL (ref 0.1–1.0)
Monocytes Relative: 12 %
Neutro Abs: 2 10*3/uL (ref 1.7–7.7)
Neutrophils Relative %: 66 %
Platelets: 145 10*3/uL — ABNORMAL LOW (ref 150–400)
RBC: 3.56 MIL/uL — ABNORMAL LOW (ref 4.22–5.81)
RDW: 12.8 % (ref 11.5–15.5)
WBC: 2.9 10*3/uL — ABNORMAL LOW (ref 4.0–10.5)
nRBC: 0 % (ref 0.0–0.2)

## 2020-10-23 LAB — MAGNESIUM: Magnesium: 1.7 mg/dL (ref 1.7–2.4)

## 2020-10-23 MED ORDER — SODIUM CHLORIDE 0.9 % IV SOLN
40.0000 mg/m2 | Freq: Once | INTRAVENOUS | Status: AC
  Administered 2020-10-23: 68 mg via INTRAVENOUS
  Filled 2020-10-23: qty 68

## 2020-10-23 MED ORDER — SODIUM CHLORIDE 0.9% FLUSH
10.0000 mL | INTRAVENOUS | Status: DC | PRN
  Administered 2020-10-23: 10 mL
  Filled 2020-10-23: qty 10

## 2020-10-23 MED ORDER — MAGNESIUM SULFATE 2 GM/50ML IV SOLN
2.0000 g | Freq: Once | INTRAVENOUS | Status: AC
  Administered 2020-10-23: 2 g via INTRAVENOUS

## 2020-10-23 MED ORDER — SODIUM CHLORIDE 0.9 % IV SOLN
Freq: Once | INTRAVENOUS | Status: AC
  Filled 2020-10-23: qty 250

## 2020-10-23 MED ORDER — PALONOSETRON HCL INJECTION 0.25 MG/5ML
0.2500 mg | Freq: Once | INTRAVENOUS | Status: AC
  Administered 2020-10-23: 0.25 mg via INTRAVENOUS

## 2020-10-23 MED ORDER — PALONOSETRON HCL INJECTION 0.25 MG/5ML
INTRAVENOUS | Status: AC
  Filled 2020-10-23: qty 5

## 2020-10-23 MED ORDER — SODIUM CHLORIDE 0.9 % IV SOLN
150.0000 mg | Freq: Once | INTRAVENOUS | Status: AC
  Administered 2020-10-23: 150 mg via INTRAVENOUS
  Filled 2020-10-23: qty 150

## 2020-10-23 MED ORDER — MAGNESIUM SULFATE 2 GM/50ML IV SOLN
INTRAVENOUS | Status: AC
  Filled 2020-10-23: qty 50

## 2020-10-23 MED ORDER — HEPARIN SOD (PORK) LOCK FLUSH 100 UNIT/ML IV SOLN
500.0000 [IU] | Freq: Once | INTRAVENOUS | Status: AC | PRN
  Administered 2020-10-23: 500 [IU]
  Filled 2020-10-23: qty 5

## 2020-10-23 MED ORDER — SODIUM CHLORIDE 0.9 % IV SOLN
10.0000 mg | Freq: Once | INTRAVENOUS | Status: AC
  Administered 2020-10-23: 10 mg via INTRAVENOUS
  Filled 2020-10-23: qty 10

## 2020-10-23 MED ORDER — POTASSIUM CHLORIDE IN NACL 20-0.9 MEQ/L-% IV SOLN
Freq: Once | INTRAVENOUS | Status: AC
  Filled 2020-10-23: qty 1000

## 2020-10-23 MED ORDER — SODIUM CHLORIDE 0.9% FLUSH
10.0000 mL | Freq: Once | INTRAVENOUS | Status: AC
  Administered 2020-10-23: 10 mL
  Filled 2020-10-23: qty 10

## 2020-10-23 NOTE — Progress Notes (Signed)
Nutrition follow-up completed with patient during infusion for recurrent laryngeal cancer. Weight is stable at 133 pounds. Labs were reviewed. Patient reports he is eating very little by mouth but is drinking Ensure when he has samples. States he is using 6 cartons of Osmolite 1.5 via PEG with 120 mL free water before and after bolus feedings. Continues to have occasional nausea but no vomiting. Denies diarrhea or constipation.  Estimated nutrition needs: 2000-2200 cal, 80-95 g protein, 2.2 L fluid.  Nutrition diagnosis: Unintended weight loss stable.  Intervention: Will provide 1 complementary case of Ensure Plus for patient to drink by mouth. Patient should continue 6 cartons of Osmolite 1.5 via PEG daily with 120 mL free water before and after bolus feedings. Continue free water in soft foods by mouth as tolerated. Continue nausea medication as needed.  Monitoring, evaluation, goals: Patient will tolerate tube feeding and oral intake to promote weight stabilization.  Next visit: Tuesday, February 15.  **Disclaimer: This note was dictated with voice recognition software. Similar sounding words can inadvertently be transcribed and this note may contain transcription errors which may not have been corrected upon publication of note.**

## 2020-10-23 NOTE — Patient Instructions (Signed)
Cancer Center Discharge Instructions for Patients Receiving Chemotherapy  Today you received the following chemotherapy agents cisplatin  To help prevent nausea and vomiting after your treatment, we encourage you to take your nausea medication as directed   If you develop nausea and vomiting that is not controlled by your nausea medication, call the clinic.   BELOW ARE SYMPTOMS THAT SHOULD BE REPORTED IMMEDIATELY:  *FEVER GREATER THAN 100.5 F  *CHILLS WITH OR WITHOUT FEVER  NAUSEA AND VOMITING THAT IS NOT CONTROLLED WITH YOUR NAUSEA MEDICATION  *UNUSUAL SHORTNESS OF BREATH  *UNUSUAL BRUISING OR BLEEDING  TENDERNESS IN MOUTH AND THROAT WITH OR WITHOUT PRESENCE OF ULCERS  *URINARY PROBLEMS  *BOWEL PROBLEMS  UNUSUAL RASH Items with * indicate a potential emergency and should be followed up as soon as possible.  Feel free to call the clinic should you have any questions or concerns. The clinic phone number is [phone].  Please show the CHEMO ALERT CARD at check-in to the Emergency Department and triage nurse.   

## 2020-10-23 NOTE — Progress Notes (Unsigned)
Patient seen and assessed by Sandi Mealy P.A. Lucianne Lei aware.

## 2020-10-24 ENCOUNTER — Ambulatory Visit
Admission: RE | Admit: 2020-10-24 | Discharge: 2020-10-24 | Disposition: A | Discharge status: Home / Self Care | Payer: Medicare HMO | Source: Ambulatory Visit | Attending: Radiation Oncology | Admitting: Radiation Oncology

## 2020-10-24 DIAGNOSIS — C090 Malignant neoplasm of tonsillar fossa: Secondary | ICD-10-CM | POA: Diagnosis not present
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
## 2020-10-25 ENCOUNTER — Other Ambulatory Visit: Payer: Self-pay

## 2020-10-25 ENCOUNTER — Ambulatory Visit
Admission: RE | Admit: 2020-10-25 | Discharge: 2020-10-25 | Disposition: A | Discharge status: Home / Self Care | Payer: Medicare HMO | Source: Ambulatory Visit | Attending: Radiation Oncology | Admitting: Radiation Oncology

## 2020-10-25 DIAGNOSIS — C090 Malignant neoplasm of tonsillar fossa: Secondary | ICD-10-CM | POA: Diagnosis not present

## 2020-10-26 ENCOUNTER — Other Ambulatory Visit: Payer: Self-pay

## 2020-10-26 ENCOUNTER — Ambulatory Visit
Admission: RE | Admit: 2020-10-26 | Discharge: 2020-10-26 | Disposition: A | Discharge status: Home / Self Care | Payer: Medicare HMO | Source: Ambulatory Visit | Attending: Radiation Oncology | Admitting: Radiation Oncology

## 2020-10-26 DIAGNOSIS — C090 Malignant neoplasm of tonsillar fossa: Secondary | ICD-10-CM | POA: Diagnosis not present

## 2020-10-29 ENCOUNTER — Ambulatory Visit
Admission: RE | Admit: 2020-10-29 | Discharge: 2020-10-29 | Disposition: A | Discharge status: Home / Self Care | Payer: Medicare HMO | Source: Ambulatory Visit | Attending: Radiation Oncology | Admitting: Radiation Oncology

## 2020-10-29 ENCOUNTER — Ambulatory Visit: Payer: Medicare HMO

## 2020-10-29 DIAGNOSIS — C090 Malignant neoplasm of tonsillar fossa: Secondary | ICD-10-CM

## 2020-10-29 MED ORDER — SONAFINE EX EMUL
1.0000 "application " | Freq: Two times a day (BID) | CUTANEOUS | Status: DC
  Administered 2020-10-29: 1 via TOPICAL

## 2020-10-30 ENCOUNTER — Inpatient Hospital Stay: Payer: Medicare HMO

## 2020-10-30 ENCOUNTER — Encounter: Payer: Self-pay | Admitting: Hematology and Oncology

## 2020-10-30 ENCOUNTER — Ambulatory Visit
Admission: RE | Admit: 2020-10-30 | Discharge: 2020-10-30 | Disposition: A | Discharge status: Home / Self Care | Payer: Medicare HMO | Source: Ambulatory Visit | Attending: Radiation Oncology | Admitting: Radiation Oncology

## 2020-10-30 ENCOUNTER — Inpatient Hospital Stay (HOSPITAL_BASED_OUTPATIENT_CLINIC_OR_DEPARTMENT_OTHER): Payer: Medicare HMO | Admitting: Hematology and Oncology

## 2020-10-30 ENCOUNTER — Other Ambulatory Visit: Payer: Self-pay

## 2020-10-30 ENCOUNTER — Ambulatory Visit: Payer: Medicare HMO

## 2020-10-30 ENCOUNTER — Inpatient Hospital Stay: Payer: Medicare HMO | Admitting: Nutrition

## 2020-10-30 VITALS — BP 114/75 | HR 84 | Temp 98.1°F | Resp 19 | Ht 68.0 in | Wt 130.7 lb

## 2020-10-30 DIAGNOSIS — C090 Malignant neoplasm of tonsillar fossa: Secondary | ICD-10-CM

## 2020-10-30 DIAGNOSIS — C329 Malignant neoplasm of larynx, unspecified: Secondary | ICD-10-CM

## 2020-10-30 DIAGNOSIS — K521 Toxic gastroenteritis and colitis: Secondary | ICD-10-CM | POA: Diagnosis not present

## 2020-10-30 DIAGNOSIS — D701 Agranulocytosis secondary to cancer chemotherapy: Secondary | ICD-10-CM | POA: Diagnosis not present

## 2020-10-30 DIAGNOSIS — T451X5A Adverse effect of antineoplastic and immunosuppressive drugs, initial encounter: Secondary | ICD-10-CM

## 2020-10-30 DIAGNOSIS — Z5111 Encounter for antineoplastic chemotherapy: Secondary | ICD-10-CM | POA: Diagnosis not present

## 2020-10-30 DIAGNOSIS — Z95828 Presence of other vascular implants and grafts: Secondary | ICD-10-CM

## 2020-10-30 DIAGNOSIS — K1231 Oral mucositis (ulcerative) due to antineoplastic therapy: Secondary | ICD-10-CM

## 2020-10-30 LAB — CBC WITH DIFFERENTIAL/PLATELET
Abs Immature Granulocytes: 0.02 10*3/uL (ref 0.00–0.07)
Basophils Absolute: 0 10*3/uL (ref 0.0–0.1)
Basophils Relative: 0 %
Eosinophils Absolute: 0 10*3/uL (ref 0.0–0.5)
Eosinophils Relative: 0 %
HCT: 30.4 % — ABNORMAL LOW (ref 39.0–52.0)
Hemoglobin: 10.3 g/dL — ABNORMAL LOW (ref 13.0–17.0)
Immature Granulocytes: 1 %
Lymphocytes Relative: 22 %
Lymphs Abs: 0.5 10*3/uL — ABNORMAL LOW (ref 0.7–4.0)
MCH: 30.8 pg (ref 26.0–34.0)
MCHC: 33.9 g/dL (ref 30.0–36.0)
MCV: 91 fL (ref 80.0–100.0)
Monocytes Absolute: 0.3 10*3/uL (ref 0.1–1.0)
Monocytes Relative: 12 %
Neutro Abs: 1.6 10*3/uL — ABNORMAL LOW (ref 1.7–7.7)
Neutrophils Relative %: 65 %
Platelets: 132 10*3/uL — ABNORMAL LOW (ref 150–400)
RBC: 3.34 MIL/uL — ABNORMAL LOW (ref 4.22–5.81)
RDW: 13.2 % (ref 11.5–15.5)
WBC: 2.5 10*3/uL — ABNORMAL LOW (ref 4.0–10.5)
nRBC: 0 % (ref 0.0–0.2)

## 2020-10-30 LAB — BASIC METABOLIC PANEL
Anion gap: 8 (ref 5–15)
BUN: 13 mg/dL (ref 8–23)
CO2: 26 mmol/L (ref 22–32)
Calcium: 8.6 mg/dL — ABNORMAL LOW (ref 8.9–10.3)
Chloride: 97 mmol/L — ABNORMAL LOW (ref 98–111)
Creatinine, Ser: 0.73 mg/dL (ref 0.61–1.24)
GFR, Estimated: >60 mL/min (ref >60)
Glucose, Bld: 123 mg/dL — ABNORMAL HIGH (ref 70–99)
Potassium: 4.1 mmol/L (ref 3.5–5.1)
Sodium: 131 mmol/L — ABNORMAL LOW (ref 135–145)

## 2020-10-30 LAB — MAGNESIUM: Magnesium: 1.6 mg/dL — ABNORMAL LOW (ref 1.7–2.4)

## 2020-10-30 MED ORDER — SODIUM CHLORIDE 0.9 % IV SOLN
40.0000 mg/m2 | Freq: Once | INTRAVENOUS | Status: AC
  Administered 2020-10-30: 68 mg via INTRAVENOUS
  Filled 2020-10-30: qty 68

## 2020-10-30 MED ORDER — SODIUM CHLORIDE 0.9 % IV SOLN
10.0000 mg | Freq: Once | INTRAVENOUS | Status: AC
  Administered 2020-10-30: 10 mg via INTRAVENOUS
  Filled 2020-10-30: qty 10

## 2020-10-30 MED ORDER — POTASSIUM CHLORIDE IN NACL 20-0.9 MEQ/L-% IV SOLN
Freq: Once | INTRAVENOUS | Status: AC
  Filled 2020-10-30: qty 1000

## 2020-10-30 MED ORDER — SODIUM CHLORIDE 0.9% FLUSH
10.0000 mL | INTRAVENOUS | Status: DC | PRN
  Filled 2020-10-30: qty 10

## 2020-10-30 MED ORDER — PALONOSETRON HCL INJECTION 0.25 MG/5ML
0.2500 mg | Freq: Once | INTRAVENOUS | Status: AC
  Administered 2020-10-30: 0.25 mg via INTRAVENOUS

## 2020-10-30 MED ORDER — MAGNESIUM SULFATE 2 GM/50ML IV SOLN
INTRAVENOUS | Status: AC
  Filled 2020-10-30: qty 50

## 2020-10-30 MED ORDER — SODIUM CHLORIDE 0.9 % IV SOLN
Freq: Once | INTRAVENOUS | Status: AC
Start: 2020-10-30 — End: 2020-10-30
  Filled 2020-10-30: qty 250

## 2020-10-30 MED ORDER — HEPARIN SOD (PORK) LOCK FLUSH 100 UNIT/ML IV SOLN
500.0000 [IU] | Freq: Once | INTRAVENOUS | Status: AC | PRN
  Administered 2020-10-30: 500 [IU]
  Filled 2020-10-30: qty 5

## 2020-10-30 MED ORDER — SODIUM CHLORIDE 0.9 % IV SOLN
150.0000 mg | Freq: Once | INTRAVENOUS | Status: AC
  Administered 2020-10-30: 150 mg via INTRAVENOUS
  Filled 2020-10-30: qty 150

## 2020-10-30 MED ORDER — MAGNESIUM SULFATE 2 GM/50ML IV SOLN
2.0000 g | Freq: Once | INTRAVENOUS | Status: AC
  Administered 2020-10-30: 2 g via INTRAVENOUS

## 2020-10-30 MED ORDER — SODIUM CHLORIDE 0.9% FLUSH
10.0000 mL | Freq: Once | INTRAVENOUS | Status: AC
  Administered 2020-10-30: 10 mL
  Filled 2020-10-30: qty 10

## 2020-10-30 NOTE — Progress Notes (Signed)
Nutrition follow-up completed with patient in the infusion room.  Patient is receiving treatment for recurrent laryngeal cancer. Weight documented as 130.7 pounds February 15. Patient reports occasional nausea. States he is having watery diarrhea 3 times a day but has been encouraged to start taking Imodium. Reports continuing 6 cartons Osmolite 1.5 via PEG with 120 mL free water before and after bolus feedings. Patient continues drinking Ensure by mouth and would like to have an additional case. Final radiation therapy scheduled tomorrow.  Estimated nutrition needs: 2000-2200 cal, 80-95 g protein, 2.2 L fluid.  Nutrition diagnosis: Unintended weight loss stable.  Intervention: Provided 1 complementary case of Ensure Plus. Instructed patient to take Imodium per MD. I doubt diarrhea is from tube feeding as patient has been tolerating tube feeding without diarrhea for several weeks. Take nausea medication as prescribed to reduce nausea. Continue oral intake as tolerated. Strive for weight stability.  Monitoring, evaluation, goals: Patient will tolerate adequate calories and protein to minimize weight loss.  Next visit: To be scheduled.  **Disclaimer: This note was dictated with voice recognition software. Similar sounding words can inadvertently be transcribed and this note may contain transcription errors which may not have been corrected upon publication of note.**

## 2020-10-30 NOTE — Progress Notes (Signed)
MD states okay to treat with only 111ml urine output.

## 2020-10-30 NOTE — Patient Instructions (Signed)
[facility] Discharge Instructions for Patients Receiving Chemotherapy  Today you received the following chemotherapy agents Cisplatin  To help prevent nausea and vomiting after your treatment, we encourage you to take your nausea medication as directed  If you develop nausea and vomiting that is not controlled by your nausea medication, call the clinic.   BELOW ARE SYMPTOMS THAT SHOULD BE REPORTED IMMEDIATELY:  *FEVER GREATER THAN 100.5 F  *CHILLS WITH OR WITHOUT FEVER  NAUSEA AND VOMITING THAT IS NOT CONTROLLED WITH YOUR NAUSEA MEDICATION  *UNUSUAL SHORTNESS OF BREATH  *UNUSUAL BRUISING OR BLEEDING  TENDERNESS IN MOUTH AND THROAT WITH OR WITHOUT PRESENCE OF ULCERS  *URINARY PROBLEMS  *BOWEL PROBLEMS  UNUSUAL RASH Items with * indicate a potential emergency and should be followed up as soon as possible.  Feel free to call the clinic should you have any questions or concerns. The clinic phone number is [phone].  Please show the CHEMO ALERT CARD at check-in to the Emergency Department and triage nurse.   

## 2020-10-30 NOTE — Patient Instructions (Signed)
Implanted Port Insertion, Care After This sheet gives you information about how to care for yourself after your procedure. Your health care provider may also give you more specific instructions. If you have problems or questions, contact your health care provider. What can I expect after the procedure? After the procedure, it is common to have:  Discomfort at the port insertion site.  Bruising on the skin over the port. This should improve over 3-4 days. Follow these instructions at home: Port care  After your port is placed, you will get a manufacturer's information card. The card has information about your port. Keep this card with you at all times.  Take care of the port as told by your health care provider. Ask your health care provider if you or a family member can get training for taking care of the port at home. A home health care nurse may also take care of the port.  Make sure to remember what type of port you have. Incision care  Follow instructions from your health care provider about how to take care of your port insertion site. Make sure you: ? Wash your hands with soap and water before and after you change your bandage (dressing). If soap and water are not available, use hand sanitizer. ? Change your dressing as told by your health care provider. ? Leave stitches (sutures), skin glue, or adhesive strips in place. These skin closures may need to stay in place for 2 weeks or longer. If adhesive strip edges start to loosen and curl up, you may trim the loose edges. Do not remove adhesive strips completely unless your health care provider tells you to do that.  Check your port insertion site every day for signs of infection. Check for: ? Redness, swelling, or pain. ? Fluid or blood. ? Warmth. ? Pus or a bad smell.      Activity  Return to your normal activities as told by your health care provider. Ask your health care provider what activities are safe for you.  Do not  lift anything that is heavier than 10 lb (4.5 kg), or the limit that you are told, until your health care provider says that it is safe. General instructions  Take over-the-counter and prescription medicines only as told by your health care provider.  Do not take baths, swim, or use a hot tub until your health care provider approves. Ask your health care provider if you may take showers. You may only be allowed to take sponge baths.  Do not drive for 24 hours if you were given a sedative during your procedure.  Wear a medical alert bracelet in case of an emergency. This will tell any health care providers that you have a port.  Keep all follow-up visits as told by your health care provider. This is important. Contact a health care provider if:  You cannot flush your port with saline as directed, or you cannot draw blood from the port.  You have a fever or chills.  You have redness, swelling, or pain around your port insertion site.  You have fluid or blood coming from your port insertion site.  Your port insertion site feels warm to the touch.  You have pus or a bad smell coming from the port insertion site. Get help right away if:  You have chest pain or shortness of breath.  You have bleeding from your port that you cannot control. Summary  Take care of the port as told by your   health care provider. Keep the manufacturer's information card with you at all times.  Change your dressing as told by your health care provider.  Contact a health care provider if you have a fever or chills or if you have redness, swelling, or pain around your port insertion site.  Keep all follow-up visits as told by your health care provider. This information is not intended to replace advice given to you by your health care provider. Make sure you discuss any questions you have with your health care provider. Document Revised: 03/30/2018 Document Reviewed: 03/30/2018 [employer] Patient Education   2021 [employer]  

## 2020-10-30 NOTE — Progress Notes (Signed)
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 closure;Reduced tongue base retraction;Penetration/Aspiration during swallow;Penetration/Apiration after swallow;Trace aspiration Pharyngeal Material enters airway, passes BELOW cords then ejected out Pharyngeal- Thin Straw Delayed swallow initiation-vallecula;Delayed swallow initiation-pyriform sinuses;Reduced pharyngeal peristalsis;Reduced epiglottic inversion;Reduced anterior laryngeal mobility;Reduced laryngeal elevation;Reduced airway/laryngeal closure;Reduced tongue base retraction;Pharyngeal residue - valleculae;Pharyngeal residue - pyriform;Pharyngeal residue - posterior pharnyx;Pharyngeal residue - cp segment;Inter-arytenoid space residue;Lateral channel residue Pharyngeal Material enters airway, remains ABOVE vocal cords and not ejected out Pharyngeal- Puree Reduced pharyngeal peristalsis;Reduced epiglottic inversion;Reduced anterior laryngeal mobility;Reduced laryngeal elevation;Reduced airway/laryngeal closure;Reduced tongue base retraction;Pharyngeal residue - valleculae  Pharyngeal Material does not enter airway Pharyngeal- Mechanical Soft Reduced pharyngeal peristalsis;Reduced epiglottic inversion;Reduced anterior laryngeal mobility;Reduced laryngeal elevation;Reduced airway/laryngeal closure;Reduced tongue base retraction;Pharyngeal residue - valleculae Pharyngeal Material does not enter airway Pharyngeal- Regular -- Pharyngeal -- Pharyngeal- Multi-consistency -- Pharyngeal -- Pharyngeal- Pill -- Pharyngeal --  Pharyngeal Comment various postures including head turn right, chin tuck and strategies such as effortful swallow, "expectoration" attempted,  CHL IP CERVICAL ESOPHAGEAL PHASE 10/04/2020 Cervical Esophageal Phase Impaired Pudding Teaspoon -- Pudding Cup -- Honey Teaspoon -- Honey Cup -- Nectar Teaspoon -- Nectar Cup -- Nectar Straw -- Thin Teaspoon -- Thin Cup -- Thin Straw -- Puree -- Mechanical Soft -- Regular -- Multi-consistency -- Pill -- Cervical Esophageal Comment decreased clearance into cervical esophagus results in retention of liquids that is largely cleared with dry swallows Macario Golds 10/04/2020, 4:34 PM    Kathleen Lime, MS [place] Office [phone] Pager [phone]         CLINICAL DATA:  Dysphagia. Prior recurrent head neck cancer. EXAM: MODIFIED BARIUM SWALLOW TECHNIQUE: Different consistencies of barium were administered orally to the patient by the Speech Pathologist. Imaging of the pharynx was performed in the lateral projection. The radiologist was present in the fluoroscopy room for this study, providing personal supervision. FLUOROSCOPY TIME:  Fluoroscopy Time:  3 minutes and 6 seconds Radiation Exposure Index (if provided by the fluoroscopic device): 9.5 mGy Number of Acquired Spot Images: 0 COMPARISON:  None. FINDINGS: With thin liquids, there is small volume aspiration. With thin liquids with cracker, there is post swallow retention. With nectar thickness, there is small volume/trace aspiration. With puree thickness, there  is post swallow retention. IMPRESSION: Swallow dysfunction, as above. Please refer to the Speech Pathologists report for complete details and recommendations. Electronically Signed   By: Abigail Miyamoto M.D.   On: 10/04/2020 14:27    All questions were answered. The patient knows to call the clinic with any problems, questions or concerns.      [name], MD 10/30/2020 9:40 AM

## 2020-10-31 ENCOUNTER — Other Ambulatory Visit: Payer: Self-pay

## 2020-10-31 ENCOUNTER — Ambulatory Visit
Admission: RE | Admit: 2020-10-31 | Discharge: 2020-10-31 | Disposition: A | Discharge status: Home / Self Care | Payer: Medicare HMO | Source: Ambulatory Visit | Attending: Radiation Oncology | Admitting: Radiation Oncology

## 2020-10-31 ENCOUNTER — Encounter: Payer: Self-pay | Admitting: Radiation Oncology

## 2020-10-31 DIAGNOSIS — C090 Malignant neoplasm of tonsillar fossa: Secondary | ICD-10-CM | POA: Diagnosis not present

## 2020-11-06 ENCOUNTER — Inpatient Hospital Stay: Payer: Medicare HMO | Admitting: Medical

## 2020-11-06 ENCOUNTER — Encounter: Payer: Self-pay | Admitting: Nutrition

## 2020-11-06 ENCOUNTER — Inpatient Hospital Stay: Payer: Medicare HMO

## 2020-11-06 ENCOUNTER — Encounter: Payer: Medicare HMO | Admitting: Nutrition

## 2020-11-06 NOTE — Progress Notes (Signed)
Patient did not show up for appointment.

## 2020-11-07 ENCOUNTER — Other Ambulatory Visit: Payer: Self-pay | Admitting: Hematology and Oncology

## 2020-11-13 ENCOUNTER — Ambulatory Visit: Payer: Medicare HMO | Admitting: Physical Therapy

## 2020-11-20 ENCOUNTER — Ambulatory Visit
Admission: RE | Admit: 2020-11-20 | Discharge: 2020-11-20 | Disposition: A | Discharge status: Home / Self Care | Payer: Medicare HMO | Source: Ambulatory Visit | Attending: Radiation Oncology | Admitting: Radiation Oncology

## 2020-11-20 ENCOUNTER — Other Ambulatory Visit: Payer: Self-pay

## 2020-11-20 VITALS — BP 144/85 | HR 81 | Temp 97.2°F | Resp 18 | Ht 68.0 in | Wt 130.5 lb

## 2020-11-20 DIAGNOSIS — R5383 Other fatigue: Secondary | ICD-10-CM | POA: Diagnosis not present

## 2020-11-20 DIAGNOSIS — C770 Secondary and unspecified malignant neoplasm of lymph nodes of head, face and neck: Secondary | ICD-10-CM | POA: Insufficient documentation

## 2020-11-20 DIAGNOSIS — Z923 Personal history of irradiation: Secondary | ICD-10-CM | POA: Insufficient documentation

## 2020-11-20 DIAGNOSIS — F17210 Nicotine dependence, cigarettes, uncomplicated: Secondary | ICD-10-CM | POA: Insufficient documentation

## 2020-11-20 DIAGNOSIS — Z79899 Other long term (current) drug therapy: Secondary | ICD-10-CM | POA: Diagnosis not present

## 2020-11-20 DIAGNOSIS — R682 Dry mouth, unspecified: Secondary | ICD-10-CM | POA: Diagnosis not present

## 2020-11-20 DIAGNOSIS — C090 Malignant neoplasm of tonsillar fossa: Secondary | ICD-10-CM | POA: Diagnosis present

## 2020-11-20 DIAGNOSIS — C4442 Squamous cell carcinoma of skin of scalp and neck: Secondary | ICD-10-CM

## 2020-11-20 NOTE — Progress Notes (Addendum)
Mr. Velazco presents today for 2 week follow-up after completing radiation to his oropharynx on 10/31/2020  Pain issues, if any: Yes--continues to deal with throat pain (rates 8 out of 10) Using a feeding tube?: Yes--reports he does 3 feedings a day. Instills 2 cartons of supplement at each feedings. Reports occasional nausea after a feeding Weight changes, if any:  Wt Readings from Last 3 Encounters:  11/20/20 130 lb 8 oz (59.2 kg)  10/30/20 130 lb 11.2 oz (59.3 kg)  10/23/20 131 lb (59.4 kg)   Swallowing issues, if any: Yes--unable to tolerate even thin liquids. Taking all nutrition and medication via PEG tube Smoking or chewing tobacco? Reports occasional cigarette use. Reports last time he smoked was around the [place] Using fluoride trays daily? N/A Last ENT visit was on: Not since diagnosis Other notable issues, if any: Continues to deal with dry mouth and thick saliva. Denies any ear or jaw pain, or difficulty opening his mouth fully (though he mentions struggling with a stiff jaw a couple weeks ago). Continued fatigue; reports difficulty falling asleep and staying asleep. Skin in treatment field appears intact with a few areas of dry/scaley skin.   Vitals:   11/20/20 1512  BP: (!) 144/85  Pulse: 81  Resp: 18  Temp: (!) 97.2 F (36.2 C)  SpO2: 100%

## 2020-11-20 NOTE — Progress Notes (Signed)
Oncology Nurse Navigator Documentation  I spoke with Mr. Fife and his caregiver Verdene Lennert after his follow up appointment with Dr. Isidore Moos today. He requested that I cancel his appointment with Garald Balding SLP on Friday 11/23/20. Mr. Cornwall is seeing his ENT MD Dr. Ilda Foil at [facility] on 12/03/20 and would like to wait to see SLP and PT until after that appointment. I will also send Dr. Rob Hickman scheduler a message to get Mr. Lasch scheduled with her for a post-treatment follow up as well. He will be scheduled to see Dr. Isidore Moos again in May 2022. They know to call me if they have any further questions or needs.   Harlow Asa RN, BSN, OCN Head & Neck Oncology Nurse [place] at [facility] Phone # [phone]  Fax # [phone]

## 2020-11-21 ENCOUNTER — Other Ambulatory Visit: Payer: Self-pay

## 2020-11-21 DIAGNOSIS — C4442 Squamous cell carcinoma of skin of scalp and neck: Secondary | ICD-10-CM

## 2020-11-22 ENCOUNTER — Telehealth: Payer: Self-pay | Admitting: Hematology and Oncology

## 2020-11-22 NOTE — Telephone Encounter (Signed)
Scheduled appointment per 3/9 inbasket msg from MD. Called patient, no answer and mailbox was full. Mailed updated calendar to patient.

## 2020-11-23 ENCOUNTER — Encounter: Payer: Self-pay | Admitting: Radiation Oncology

## 2020-11-23 ENCOUNTER — Other Ambulatory Visit: Payer: Self-pay

## 2020-11-23 ENCOUNTER — Ambulatory Visit: Payer: Medicare HMO

## 2020-11-23 DIAGNOSIS — C329 Malignant neoplasm of larynx, unspecified: Secondary | ICD-10-CM

## 2020-11-23 DIAGNOSIS — Z1329 Encounter for screening for other suspected endocrine disorder: Secondary | ICD-10-CM

## 2020-11-23 NOTE — Progress Notes (Signed)
Radiation Oncology         [phone] ________________________________  Name: [name] MRN: [id number]  Date: 11/20/2020  DOB: October 01, 1950  Follow-Up Visit Note  CC: [name], MD  [name], MD  Diagnosis and Prior Radiotherapy:       ICD-10-CM   1. Malignant neoplasm of tonsillar fossa ([place])  C09.0   2. Cancer of tonsillar fossa ([facility])  C09.0   3. Metastatic cancer to cervical lymph nodes ([place])  C77.0   4. Squamous cell carcinoma of neck  C44.42     CHIEF COMPLAINT:  Here for follow-up and surveillance of recurrent throat cancer  Narrative:  The patient returns today for routine follow-up.   [name] presents today for 2 week follow-up after completing radiation to his oropharynx and recurrent neck nodes on 10/31/2020  Pain issues, if any: Yes--continues to deal with throat pain (rates 8 out of 10) Using a feeding tube?: Yes--reports he does 3 feedings a day. Instills 2 cartons of supplement at each feedings. Reports occasional nausea after a feeding Weight changes, if any:  Wt Readings from Last 3 Encounters:  11/20/20 130 lb 8 oz (59.2 kg)  10/30/20 130 lb 11.2 oz (59.3 kg)  10/23/20 131 lb (59.4 kg)   Swallowing issues, if any: Yes--unable to tolerate even thin liquids. Taking all nutrition and medication via PEG tube Smoking or chewing tobacco? Reports occasional cigarette use. Reports last time he smoked was around the [place] Using fluoride trays daily? N/A Last ENT visit was on: Not since diagnosis Other notable issues, if any: Continues to deal with dry mouth and thick saliva. Denies any ear or jaw pain, or difficulty opening his mouth fully (though he mentions struggling with a stiff jaw a couple weeks ago). Continued fatigue; reports difficulty falling asleep and staying asleep. Skin in treatment field appears intact with a few areas of dry/scaly skin.     ALLERGIES:  has No Known Allergies.  Meds: Current Outpatient Medications  Medication  Sig Dispense Refill  . atropine 1 % ophthalmic solution Place 1 drop into the right eye 2 (two) times a day.     . cetirizine (ZYRTEC) 10 MG tablet Take 10 mg by mouth daily as needed for allergies.    Marland Kitchen dexamethasone (DECADRON) 4 MG tablet Take 2 tablets (8 mg total) by mouth daily. Take daily x 3 days starting the day after cisplatin chemotherapy. Take with food. 30 tablet 1  . docusate sodium (COLACE) 50 MG capsule Take 1 capsule (50 mg total) by mouth 2 (two) times daily. 10 capsule 0  . HYDROcodone-acetaminophen (HYCET) 7.5-325 mg/15 ml solution Take 10 mLs by mouth 4 (four) times daily as needed for moderate pain (Cancer pain). 473 mL 0  . ibuprofen (ADVIL) 200 MG tablet Take 400 mg by mouth every 6 (six) hours as needed for headache or moderate pain.    Marland Kitchen latanoprost (XALATAN) 0.005 % ophthalmic solution Place 1 drop into the right eye at bedtime.     . lidocaine (XYLOCAINE) 2 % solution Patient: Mix 1part 2% viscous lidocaine, 1part H20. Swish AND swallow 67mL of diluted mixture, 47min before meals and at bedtime, up to QID 200 mL 3  . lidocaine-prilocaine (EMLA) cream Apply to affected area once (Patient taking differently: Apply 1 application topically daily as needed (port access). Apply to affected area once) 30 g 3  . LORazepam (ATIVAN) 0.5 MG tablet Take 1 tablet (0.5 mg total) by mouth every 6 (six) hours  as needed (Nausea or vomiting). 30 tablet 0  . losartan (COZAAR) 50 MG tablet Take 50 mg by mouth daily.    . Nutritional Supplements (FEEDING SUPPLEMENT, OSMOLITE 1.5 CAL,) LIQD Give 1 carton Osmolite 1.5 via PEG 4 times daily with 90 mL free water before and after bolus feeding.  Increase to 1-1/2 cartons 4 times daily.  In addition, drink or flush tube with an additional 240 mL free water twice daily.  This provides 2130 cal, 89.4 g protein and 2352 mL free water which is 100% estimated needs. 1422 mL 0  . ondansetron (ZOFRAN) 8 MG tablet Take 1 tablet (8 mg total) by mouth 2 (two)  times daily as needed. Start on the third day after cisplatin chemotherapy. 30 tablet 1  . prednisoLONE acetate (PRED FORTE) 1 % ophthalmic suspension Place 1 drop into both eyes daily.    . prochlorperazine (COMPAZINE) 10 MG tablet Take 1 tablet (10 mg total) by mouth every 6 (six) hours as needed (Nausea or vomiting). 30 tablet 1  . tolterodine (DETROL LA) 4 MG 24 hr capsule Take 4 mg by mouth daily.    . Vitamin D, Ergocalciferol, (DRISDOL) 1.25 MG (50000 UNIT) CAPS capsule Take 50,000 Units by mouth once a week.     No current facility-administered medications for this encounter.    Physical Findings: The patient is in no acute distress. Patient is alert and oriented. Wt Readings from Last 3 Encounters:  11/20/20 130 lb 8 oz (59.2 kg)  10/30/20 130 lb 11.2 oz (59.3 kg)  10/23/20 131 lb (59.4 kg)    height is 5\' 8"  (1.727 m) and weight is 130 lb 8 oz (59.2 kg). His temporal temperature is 97.2 F (36.2 C) (abnormal). His blood pressure is 144/85 (abnormal) and his pulse is 81. His respiration is 18 and oxygen saturation is 100%. .  General: Alert and oriented, in no acute distress HEENT: Head is normocephalic. Extraocular movements are intact. Oropharynx is notable for no thrush Neck: Neck is notable for dry skin, resolving hyperpigmentation in RT fields, no palpable adenopathy. Skin: Skin in treatment fields shows  dry skin, resolving hyperpigmentation in RT fields     Lab Findings: Lab Results  Component Value Date   WBC 2.5 (L) 10/30/2020   HGB 10.3 (L) 10/30/2020   HCT 30.4 (L) 10/30/2020   MCV 91.0 10/30/2020   PLT 132 (L) 10/30/2020    Lab Results  Component Value Date   TSH 0.905 01/31/2020    Radiographic Findings: No results found.  Impression/Plan:    1) Head and Neck Cancer Status: Healing from reirradiation  2) Nutritional Status: Stable Wt Readings from Last 3 Encounters:  11/20/20 130 lb 8 oz (59.2 kg)  10/30/20 130 lb 11.2 oz (59.3 kg)  10/23/20  131 lb (59.4 kg)    PEG tube: Using  3) Risk Factors: The patient has been educated about risk factors including alcohol and tobacco abuse; they understand that avoidance of alcohol and tobacco is important to prevent recurrences as well as other cancers; unfortunately he is still smoking at times and he understands the risks of this.  4) Swallowing: He is PEG tube dependent.  He has an appointment later this week with speech-language pathology but reports that he does not plan to make this appointment.  Our patient navigator is going to speak with him later today to figure out how to facilitate appointments that he can attend.  I underscored to the patient how important it is  that he receives swallowing therapy to restore his ability to swallow  5) Thyroid function: Check annually Lab Results  Component Value Date   TSH 0.905 01/31/2020    6) Other: He has been lost to follow-up with medical oncology.  I asked our patient navigator, [name] Malta, to address this  7) Follow-up in May with restaging PET and TSH. The patient was encouraged to call with any issues or questions before then.  On date of service, in total, I spent 25 minutes on this encounter. Patient was seen in person. _____________________________________   [name], MD

## 2020-12-03 ENCOUNTER — Encounter: Payer: Self-pay | Admitting: *Deleted

## 2020-12-03 NOTE — Progress Notes (Signed)
[place] Work  Clinical Social Work received call from patient and patients friend, Verdene Lennert asking for housing resources.  CSW and patient discussed housing options and patient stated he had not contacted anyone in the community.  Patient was agreeable to the CSW making a referral to the housing coalition.  CSW also encouraged patient to contact [place].  CSW will continue to research housing options and resources.   Johnnye Lana, MSW, LCSW, OSW-C Clinical Social Worker [facility] [phone]

## 2020-12-04 ENCOUNTER — Other Ambulatory Visit (HOSPITAL_COMMUNITY): Payer: Self-pay | Admitting: Diagnostic Radiology

## 2020-12-04 DIAGNOSIS — Z931 Gastrostomy status: Secondary | ICD-10-CM

## 2020-12-06 ENCOUNTER — Ambulatory Visit (HOSPITAL_COMMUNITY): Admission: RE | Admit: 2020-12-06 | Payer: Medicare HMO | Source: Ambulatory Visit

## 2020-12-06 NOTE — Progress Notes (Signed)
Oncology Nurse Navigator Documentation  I received a phone call from [name]' caregiver [name] today. She requested that [name] be scheduled to see Garald Balding SLP at his outpatient clinic to continue with appointments for evaluation as ordered by Dr. Isidore Moos. I have contacted Glendell Docker and his scheduler to call [name] for an appointment. They know to call me if they have any further needs or concerns.   Harlow Asa RN, BSN, OCN Head & Neck Oncology Nurse [place] at [facility] Phone # [phone]  Fax # [phone]

## 2020-12-13 ENCOUNTER — Ambulatory Visit: Payer: Medicare HMO | Attending: Radiation Oncology

## 2020-12-13 ENCOUNTER — Other Ambulatory Visit: Payer: Self-pay

## 2020-12-13 DIAGNOSIS — R1313 Dysphagia, pharyngeal phase: Secondary | ICD-10-CM | POA: Diagnosis present

## 2020-12-13 NOTE — Therapy (Signed)
[facility] [address] [place], [place], [address] Phone: [phone]   Fax:  [phone]  Speech Language Pathology Treatment  Patient Details  Name: [name] MRN: [id number] Date of Birth: 07-05-1951 Referring Provider (SLP): [name], MD   Encounter Date: 12/13/2020   End of Session - 12/13/20 1642    Visit Number 2    Number of Visits 17    Date for SLP Re-Evaluation 03/13/21    Authorization Type humana    SLP Start Time 1532    SLP Stop Time  1603    SLP Time Calculation (min) 31 min    Activity Tolerance Patient tolerated treatment well           Past Medical History:  Diagnosis Date  . Cancer ([place])    Throat cancer 2019  . ETOH abuse   . Frequent urination   . Glaucoma   . Hepatitis C virus infection without hepatic coma    dx'ed in 11/2018  . History of radiation therapy 05/12/19- 07/06/19   Larynx  . Hypertension   . Wears denture    upper only; lost lower denture    Past Surgical History:  Procedure Laterality Date  . ANKLE SURGERY  2011   right ankle  . COLONOSCOPY  02/2019   polyps - Dr Havery Moros  . DIRECT LARYNGOSCOPY N/A 04/01/2019   Procedure: DIRECT LARYNGOSCOPY WITH BIOPSY;  Surgeon: Jodi Marble, MD;  Location: [facility];  Service: ENT;  Laterality: N/A;  . DIRECT LARYNGOSCOPY N/A 01/06/2020   Procedure: DIRECT LARYNGOSCOPY;  Surgeon: Izora Gala, MD;  Location: [facility];  Service: ENT;  Laterality: N/A;  . DIRECT LARYNGOSCOPY N/A 08/13/2020   Procedure: DIRECT LARYNGOSCOPY;  Surgeon: Izora Gala, MD;  Location: [facility];  Service: ENT;  Laterality: N/A;  . ESOPHAGOSCOPY N/A 04/01/2019   Procedure: ESOPHAGOSCOPY;  Surgeon: Jodi Marble, MD;  Location: [facility];  Service: ENT;  Laterality: N/A;  . EXCISION ORAL TUMOR Right 08/13/2020   Procedure: BIOPSY OF OROPHARYNGEAL MASS;  Surgeon: Izora Gala, MD;  Location: [facility];  Service: ENT;   Laterality: Right;  . EYE SURGERY Right   . IR GASTROSTOMY TUBE MOD SED  09/24/2020  . IR IMAGING GUIDED PORT INSERTION  09/24/2020  . KNEE SURGERY    . LARYNGOSCOPY AND BRONCHOSCOPY N/A 04/01/2019   Procedure: BRONCHOSCOPY;  Surgeon: Jodi Marble, MD;  Location: [facility];  Service: ENT;  Laterality: N/A;  . RADICAL NECK DISSECTION N/A 01/06/2020   Procedure: RADICAL NECK DISSECTION;  Surgeon: Izora Gala, MD;  Location: [facility];  Service: ENT;  Laterality: N/A;    There were no vitals filed for this visit.          ADULT SLP TREATMENT - 12/13/20 1614      General Information   Behavior/Cognition Cooperative;Pleasant mood;Lethargic;Requires cueing   somnolent at times during session     Treatment Provided   Treatment provided Dysphagia      Dysphagia Treatment   Temperature Spikes Noted No    Respiratory Status Room air    Treatment Methods Skilled observation;Therapeutic exercise;Patient/caregiver education    Patient observed directly with PO's No   told pt to bring and use toothbrush/toothpaste and mouthwash next session   Pharyngeal Phase Signs & Symptoms Wet vocal quality;Immediate throat clear   pt completed 3 effortful swallows in 25 seconds and 2 mendelsohn in 17 seconds - first swallow held 1-2 seconds and the second held  3-4 seconds. Pt req'd cues for mendelsohn   Other treatment/comments Pt somnolent at times today, but SLP used teachback method to assure pt understood all aspects of today's visit. He told me ramifications if he were to have POs. He told SLP he has completed only 4 sets of exercises in the 10 days since his FEES and the subsequent receipt of his HEP. SLP made pt aware this HEP was the only "medicine" we have at this time to give him teh best chance to have POs safely so he will need to at least perform HEP as directed at the frequency of completing as many as 10 reps of both exercises at least x3/day, and striving some days for x4/day. SLP repeated this  information re: frequency of HEP x4 today, and pt repeated details of HEP and rationale of HEP back to SLP at the end of the session with rare min A. Pt was instructed to brush teeth and use mouthwash on a pink/green swab, leaning over a sink, prior to each session and rationale for this was explained by SLP. SLP told [name] that in his previous course of tx he told SLP he was completing HEP every other day but req'd mod-max A with all exercises which is peculiar as someone who was completing HEP every other day would not require tha level of cueing. Pt agreed with SLP.      Assessment / Recommendations / Plan   Plan Goals updated;Other (Comment)   incr frequency to x2/week, duration to 8 weeks     Dysphagia Recommendations   Diet recommendations NPO   ice chips/water with SLP after meticulous oral care     Progression Toward Goals   Progression toward goals Not progressing toward goals (comment)            SLP Education - 12/13/20 1639    Education Details HEP procedure, overt s/sx aspiraiton PNA, NPO with water with SLP after meticulous oral care, what meticulous oral care entails, famifications of not doing HEP, need to do HEP with up to 10 reps of each exercise at least x3/day - see "other comments" for more details    Person(s) Educated Patient;Caregiver(s)    Methods Explanation;Demonstration;Verbal cues;Handout    Comprehension Verbalized understanding;Returned demonstration;Verbal cues required;Need further instruction            SLP Short Term Goals - 12/13/20 1648      SLP SHORT TERM GOAL #1   Title pt will demo correct procedure for HEP with rare min A, in 2 sessions    Time 4    Period Weeks   or 9 visits, for all STGs except STG #2   Status On-going      SLP SHORT TERM GOAL #2   Title pt will tell SLP why he is completing HEP with rare min A    Time 2    Period Weeks   or 5 total visits   Status On-going      SLP SHORT TERM GOAL #3   Title pt will demo swallow  precautions with occasional min A in two sessions    Time 2    Status Deferred   pt now NPO     SLP SHORT TERM GOAL #4   Title pt will tell SLP 3 overt s/sx aspiration PNA in 2 sessions after SLP review of overt s/sx    Time 4    Period Weeks    Status On-going      SLP SHORT  TERM GOAL #5   Title pt will complete 10 effortful swallows in 60 seconds    Time 4    Period Weeks    Status New            SLP Long Term Goals - 12/13/20 1650      SLP LONG TERM GOAL #1   Title pt will demo proper procedure for HEP with modified independence over 3 sessions    Time 8    Period Weeks   or 17 visits, for all LTGs   Status Revised      SLP LONG TERM GOAL #2   Title pt will perform 10 reps Mendelsohn in 75 seconds    Time 8    Period Weeks    Status Revised      SLP LONG TERM GOAL #3   Title pt will demo aspiration precauations with POs with occasional min A in 4 sessions total    Time 5    Status Deferred   pt now NPO           Plan - 12/13/20 1643    Clinical Impression Statement [name] presents today with severe dysphagia after FEES at [facility] on 12-03-20.See that report for details. NPO was recommended with ice chips/water with SLP after meticulous oral care. Intensive swallow therapy was recommended after FEES so pt's POC is changed today to be x2/week x8 weeks, or 17 total visits. SLP will need to continue to follow to track proper completion of and progress with HEP, and to assess safety with POs during ST.   Speech Therapy Frequency 2x / week   once approx every 4 weeks   Duration 8 weeks   or 17 total sessions   Treatment/Interventions Aspiration precaution training;Pharyngeal strengthening exercises;Trials of upgraded texture/liquids;Diet toleration management by SLP;Internal/external aids;Patient/family education;SLP instruction and feedback;Environmental controls;Compensatory strategies    Potential to Achieve Goals Fair    Potential Considerations Previous level of  function;Severity of impairments;Cooperation/participation level;Other (comment)   limited literacy   SLP Home Exercise Plan reviewed - Mendelsohn and Effortful swallow today    Consulted and Agree with Plan of Care Patient           Patient will benefit from skilled therapeutic intervention in order to improve the following deficits and impairments:   Pharyngeal dysphagia    Problem List Patient Active Problem List   Diagnosis Date Noted  . Chemotherapy-induced diarrhea 10/30/2020  . Port-A-Cath in place 10/23/2020  . Cancer of tonsillar fossa ([place]) 08/31/2020  . Laryngeal cancer ([place]) 01/06/2020  . Metastatic cancer to cervical lymph nodes ([place]) 12/01/2019  . Chronic hepatitis C without hepatic coma ([place]) 06/09/2019  . 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 Squamous cell carcinoma of neck 04/01/2019    [facility] ,MS, CCC-SLP  12/13/2020, 5:16 PM  [facility] [address] [place], [place], [address] Phone: [phone]   Fax:  [phone]   Name: [name] MRN: [id number] Date of Birth: [birth year]

## 2020-12-13 NOTE — Patient Instructions (Signed)
  Signs of Aspiration Pneumonia   . Chest pain/tightness . Fever (can be low grade) . Cough  o With foul-smelling phlegm (sputum) o With sputum containing pus or blood o With greenish sputum . Fatigue  . Shortness of breath  . Wheezing   **IF YOU HAVE THESE SIGNS, CONTACT YOUR DOCTOR OR GO TO THE EMERGENCY  DEPARTMENT OR URGENT CARE AS SOON AS POSSIBLE**        Pt states he has exercise handout from [facility] and knows where it is.  Mendelsohn and effortful swallow were prescribed for pt.

## 2020-12-17 ENCOUNTER — Other Ambulatory Visit: Payer: Self-pay

## 2020-12-17 ENCOUNTER — Ambulatory Visit: Payer: Medicare HMO | Attending: Radiation Oncology

## 2020-12-17 DIAGNOSIS — R1313 Dysphagia, pharyngeal phase: Secondary | ICD-10-CM | POA: Insufficient documentation

## 2020-12-17 NOTE — Therapy (Signed)
[facility] [address] [place], [place], [address] Phone: [phone]   Fax:  [phone]  Speech Language Pathology Treatment  Patient Details  Name: [name] MRN: [id number] Date of Birth: [birth year] Referring Provider (SLP): [name], MD   Encounter Date: 12/17/2020   End of Session - 12/17/20 1223    Visit Number 3    Number of Visits 17    Date for SLP Re-Evaluation 03/13/21    Authorization Type humana    SLP Start Time 1104    SLP Stop Time  1139    SLP Time Calculation (min) 35 min    Activity Tolerance Patient tolerated treatment well           Past Medical History:  Diagnosis Date  . Cancer ([place])    Throat cancer 2019  . ETOH abuse   . Frequent urination   . Glaucoma   . Hepatitis C virus infection without hepatic coma    dx'ed in 11/2018  . History of radiation therapy 05/12/19- 07/06/19   Larynx  . Hypertension   . Wears denture    upper only; lost lower denture    Past Surgical History:  Procedure Laterality Date  . ANKLE SURGERY  2011   right ankle  . COLONOSCOPY  02/2019   polyps - Dr Havery Moros  . DIRECT LARYNGOSCOPY N/A 04/01/2019   Procedure: DIRECT LARYNGOSCOPY WITH BIOPSY;  Surgeon: Jodi Marble, MD;  Location: [facility];  Service: ENT;  Laterality: N/A;  . DIRECT LARYNGOSCOPY N/A 01/06/2020   Procedure: DIRECT LARYNGOSCOPY;  Surgeon: Izora Gala, MD;  Location: [facility];  Service: ENT;  Laterality: N/A;  . DIRECT LARYNGOSCOPY N/A 08/13/2020   Procedure: DIRECT LARYNGOSCOPY;  Surgeon: Izora Gala, MD;  Location: [facility];  Service: ENT;  Laterality: N/A;  . ESOPHAGOSCOPY N/A 04/01/2019   Procedure: ESOPHAGOSCOPY;  Surgeon: Jodi Marble, MD;  Location: [facility];  Service: ENT;  Laterality: N/A;  . EXCISION ORAL TUMOR Right 08/13/2020   Procedure: BIOPSY OF OROPHARYNGEAL MASS;  Surgeon: Izora Gala, MD;  Location: [facility];  Service: ENT;   Laterality: Right;  . EYE SURGERY Right   . IR GASTROSTOMY TUBE MOD SED  09/24/2020  . IR IMAGING GUIDED PORT INSERTION  09/24/2020  . KNEE SURGERY    . LARYNGOSCOPY AND BRONCHOSCOPY N/A 04/01/2019   Procedure: BRONCHOSCOPY;  Surgeon: Jodi Marble, MD;  Location: [facility];  Service: ENT;  Laterality: N/A;  . RADICAL NECK DISSECTION N/A 01/06/2020   Procedure: RADICAL NECK DISSECTION;  Surgeon: Izora Gala, MD;  Location: [facility];  Service: ENT;  Laterality: N/A;    There were no vitals filed for this visit.   Subjective Assessment - 12/17/20 1121    Subjective Pt reports performing meticulous oral care <30 minutes prior to session intitiation.    Currently in Pain? Yes    Pain Score 7     Pain Location Throat    Pain Orientation Right    Pain Descriptors / Indicators Sore    Pain Type Acute pain    Pain Onset More than a month ago    Pain Frequency Constant    Aggravating Factors  swallowing, coughing    Pain Relieving Factors IDK    Effect of Pain on Daily Activities harder to swallow                 ADULT SLP TREATMENT - 12/17/20 1124      General  Information   Behavior/Cognition Cooperative;Pleasant mood;Lethargic;Requires cueing      Treatment Provided   Treatment provided Dysphagia      Dysphagia Treatment   Temperature Spikes Noted No    Respiratory Status Room air    Oral Cavity - Dentition Edentulous    Treatment Methods Skilled observation;Therapeutic exercise;Compensation strategy training;Patient/caregiver education    Patient observed directly with PO's Yes    Type of PO's observed Thin liquids   1/16-1/8 teaspoon boluses x5   Feeding Able to feed self    Liquids provided via Teaspoon    Pharyngeal Phase Signs & Symptoms Immediate throat clear;Immediate cough;Complaints of residue;Multiple swallows;Audible swallow   wet vocal quality after 4th teaspoon - did not clear remainder of session; sounded as if pt regurgitating phlegm/water bolus after swallowing as  pt con to swallow and then clear his throat after hydrophonia   Other treatment/comments Pt with limited literacy skills. Arrives today with clear voice. Clear voice was not heard again after x4th 1/16 teaspoon H2O even after subsequent effortful and Mendelsohn reps. Pt held [facility] for 4 seconds first rep and never >2-3 seconds on subsequent reps. SLP reiterated that only daily practice with HEP will give [name] a chance to improve his success/strength. SLP told pt he could put 30 seconds rest between reps of Mendelsohn if necessary.      Assessment / Recommendations / Plan   Plan Continue with current plan of care      Dysphagia Recommendations   Diet recommendations NPO      Progression Toward Goals   Progression toward goals Progressing toward goals            SLP Education - 12/17/20 1223    Education Details importance of BID HEP            SLP Short Term Goals - 12/17/20 1224      SLP SHORT TERM GOAL #1   Title pt will demo correct procedure for HEP with rare min A, in 2 sessions    Time 4    Period Weeks   or 9 visits, for all STGs except STG #2   Status On-going      SLP SHORT TERM GOAL #2   Title pt will tell SLP why he is completing HEP with rare min A    Time 2    Period Weeks   or 5 total visits   Status On-going      SLP SHORT TERM GOAL #3   Title pt will demo swallow precautions with occasional min A in two sessions    Time 2    Status Deferred   pt now NPO     SLP SHORT TERM GOAL #4   Title pt will tell SLP 3 overt s/sx aspiration PNA in 2 sessions after SLP review of overt s/sx    Time 4    Period Weeks    Status On-going      SLP SHORT TERM GOAL #5   Title pt will complete 10 effortful swallows in 60 seconds    Time 4    Period Weeks    Status New            SLP Long Term Goals - 12/17/20 1224      SLP LONG TERM GOAL #1   Title pt will demo proper procedure for HEP with modified independence over 3 sessions    Time 8    Period Weeks    or 17 visits, for all  LTGs   Status Revised      SLP LONG TERM GOAL #2   Title pt will perform 10 reps Mendelsohn in 75 seconds    Time 8    Period Weeks    Status Revised      SLP LONG TERM GOAL #3   Title pt will demo aspiration precauations with POs with occasional min A in 4 sessions total    Time 5    Status Deferred   pt now NPO           Plan - 12/17/20 1224    Clinical Impression Statement [name] presents today with severe dysphagia after FEES at [facility] on 12-03-20.See that report for details. NPO was recommended with ice chips/water with SLP after meticulous oral care. Intensive swallow therapy was recommended after FEES so pt's POC is changed today to be x2/week x8 weeks, or 17 total visits. SLP will need to continue to follow to track proper completion of HEP and to assess safety with POs (pt's following of aspiration precautions).    Speech Therapy Frequency 2x / week   once approx every 4 weeks   Duration 8 weeks   or 17 total sessions   Treatment/Interventions Aspiration precaution training;Pharyngeal strengthening exercises;Trials of upgraded texture/liquids;Diet toleration management by SLP;Internal/external aids;Patient/family education;SLP instruction and feedback;Environmental controls;Compensatory strategies    Potential to Achieve Goals Fair    Potential Considerations Previous level of function;Severity of impairments;Cooperation/participation level;Other (comment)   limited literacy   SLP Home Exercise Plan reviewed - Mendelsohn and Effortful swallow today    Consulted and Agree with Plan of Care Patient           Patient will benefit from skilled therapeutic intervention in order to improve the following deficits and impairments:   Pharyngeal dysphagia    Problem List Patient Active Problem List   Diagnosis Date Noted  . Chemotherapy-induced diarrhea 10/30/2020  . Port-A-Cath in place 10/23/2020  . Cancer of tonsillar fossa ([place]) 08/31/2020  .  Laryngeal cancer ([place]) 01/06/2020  . Metastatic cancer to cervical lymph nodes ([place]) 12/01/2019  . Chronic hepatitis C without hepatic coma ([place]) 06/09/2019  . 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 Squamous cell carcinoma of neck 04/01/2019    [facility], CCC-SLP  12/17/2020, 12:25 PM  [facility] [address] [place], [place], [address] Phone: [phone]   Fax:  [phone]   Name: [name] MRN: [id number] Date of Birth: 10/19/1950

## 2020-12-19 ENCOUNTER — Inpatient Hospital Stay: Payer: Medicare HMO | Admitting: Nutrition

## 2020-12-19 ENCOUNTER — Inpatient Hospital Stay: Payer: Medicare HMO | Admitting: Hematology and Oncology

## 2020-12-19 ENCOUNTER — Telehealth: Payer: Self-pay | Admitting: Hematology and Oncology

## 2020-12-19 NOTE — Telephone Encounter (Signed)
Rescheduled 04/06 appointments to 04/12 due to provider pal, patient has been called and notified.

## 2020-12-20 ENCOUNTER — Other Ambulatory Visit (HOSPITAL_COMMUNITY): Payer: Self-pay | Admitting: Interventional Radiology

## 2020-12-20 ENCOUNTER — Ambulatory Visit (HOSPITAL_COMMUNITY)
Admission: RE | Admit: 2020-12-20 | Discharge: 2020-12-20 | Disposition: A | Discharge status: Home / Self Care | Payer: Medicare HMO | Source: Ambulatory Visit | Attending: Interventional Radiology | Admitting: Interventional Radiology

## 2020-12-20 ENCOUNTER — Other Ambulatory Visit: Payer: Self-pay

## 2020-12-20 DIAGNOSIS — R1310 Dysphagia, unspecified: Secondary | ICD-10-CM | POA: Insufficient documentation

## 2020-12-20 DIAGNOSIS — K9423 Gastrostomy malfunction: Secondary | ICD-10-CM | POA: Insufficient documentation

## 2020-12-20 HISTORY — PX: IR CM INJ ANY COLONIC TUBE W/FLUORO: IMG2336

## 2020-12-20 MED ORDER — IOHEXOL 300 MG/ML  SOLN
50.0000 mL | Freq: Once | INTRAMUSCULAR | Status: AC | PRN
  Administered 2020-12-20: 20 mL

## 2020-12-21 ENCOUNTER — Ambulatory Visit: Payer: Medicare HMO

## 2020-12-21 DIAGNOSIS — R1313 Dysphagia, pharyngeal phase: Secondary | ICD-10-CM

## 2020-12-21 NOTE — Therapy (Addendum)
[facility] [address] [place], [place], [address] Phone: [phone]   Fax:  [phone]  Speech Language Pathology Treatment  Patient Details  Name: [name] MRN: [id number] Date of Birth: [birth year] Referring Provider (SLP): [name], MD   Encounter Date: 12/21/2020   End of Session - 12/21/20 1028    Visit Number 4    Number of Visits 17    Date for SLP Re-Evaluation 03/13/21    Authorization Type humana    SLP Start Time [phone]    SLP Stop Time  1007    SLP Time Calculation (min) 38 min    Activity Tolerance Patient tolerated treatment well           Past Medical History:  Diagnosis Date  . Cancer ([place])    Throat cancer 2019  . ETOH abuse   . Frequent urination   . Glaucoma   . Hepatitis C virus infection without hepatic coma    dx'ed in 11/2018  . History of radiation therapy 05/12/19- 07/06/19   Larynx  . Hypertension   . Wears denture    upper only; lost lower denture    Past Surgical History:  Procedure Laterality Date  . ANKLE SURGERY  2011   right ankle  . COLONOSCOPY  02/2019   polyps - Dr Havery Moros  . DIRECT LARYNGOSCOPY N/A 04/01/2019   Procedure: DIRECT LARYNGOSCOPY WITH BIOPSY;  Surgeon: Jodi Marble, MD;  Location: [facility];  Service: ENT;  Laterality: N/A;  . DIRECT LARYNGOSCOPY N/A 01/06/2020   Procedure: DIRECT LARYNGOSCOPY;  Surgeon: Izora Gala, MD;  Location: [facility];  Service: ENT;  Laterality: N/A;  . DIRECT LARYNGOSCOPY N/A 08/13/2020   Procedure: DIRECT LARYNGOSCOPY;  Surgeon: Izora Gala, MD;  Location: [facility];  Service: ENT;  Laterality: N/A;  . ESOPHAGOSCOPY N/A 04/01/2019   Procedure: ESOPHAGOSCOPY;  Surgeon: Jodi Marble, MD;  Location: [facility];  Service: ENT;  Laterality: N/A;  . EXCISION ORAL TUMOR Right 08/13/2020   Procedure: BIOPSY OF OROPHARYNGEAL MASS;  Surgeon: Izora Gala, MD;  Location: [facility];  Service: ENT;   Laterality: Right;  . EYE SURGERY Right   . IR CM INJ ANY COLONIC TUBE W/FLUORO  12/20/2020  . IR GASTROSTOMY TUBE MOD SED  09/24/2020  . IR IMAGING GUIDED PORT INSERTION  09/24/2020  . KNEE SURGERY    . LARYNGOSCOPY AND BRONCHOSCOPY N/A 04/01/2019   Procedure: BRONCHOSCOPY;  Surgeon: Jodi Marble, MD;  Location: [facility];  Service: ENT;  Laterality: N/A;  . RADICAL NECK DISSECTION N/A 01/06/2020   Procedure: RADICAL NECK DISSECTION;  Surgeon: Izora Gala, MD;  Location: [facility];  Service: ENT;  Laterality: N/A;    There were no vitals filed for this visit.          ADULT SLP TREATMENT - 12/21/20 0929      General Information   Behavior/Cognition Cooperative;Pleasant mood;Lethargic;Requires cueing      Treatment Provided   Treatment provided Dysphagia      Dysphagia Treatment   Temperature Spikes Noted No    Respiratory Status Room air    Oral Cavity - Dentition Edentulous    Treatment Methods Skilled observation;Therapeutic exercise;Compensation strategy training;Patient/caregiver education    Patient observed directly with PO's Yes    Type of PO's observed Thin liquids    Feeding Able to feed self    Liquids provided via Teaspoon    Pharyngeal Phase Signs & Symptoms Multiple swallows;Wet vocal quality;Immediate  cough    Other treatment/comments "I took some medicine last night - pain pills - they made me throw up. Threw up 2-3 times." Pt reports he has completed HEP as directed x2/day since last visit, except yesterday. Pt took 1/16 teaspoons of water with results as above - 5/8 teaspoons were with either throat clear of cough immediately. After 6th teaspoon pt notably fatigued - thyroid elevation was reduced. SLP guided pt through [facility] and after 5 reps pt with same decr in thyroid elevation and notable fatigue, thyoid hold decr to 1-2 seconds at this time as well. SLP gave pt 3 minutes rest and began with Caryl Ada and 1/16 teaspoons - pt had 2-3 good reps of each and then  fatigue set in again. Gave pt 2 mintues rest and then another set of Mendelsohn and 1/16 teaspoon swallows. After 2 reps of H20, pt had more fatigue demonstrated by reduced thyoid elevation and "wet" voice after multiple swallows. SLP encouraged pt to cont with HEP as directed.      Assessment / Recommendations / Plan   Plan Continue with current plan of care      Dysphagia Recommendations   Diet recommendations NPO (ice chips or 1/16 teaspoons H2O with SLP after oral care      Progression Toward Goals   Progression toward goals Progressing toward goals              SLP Short Term Goals - 12/21/20 1031      SLP SHORT TERM GOAL #1   Title pt will demo correct procedure for HEP with rare min A, in 2 sessions    Time 4    Period Weeks   or 9 visits, for all STGs except STG #2   Status On-going      SLP SHORT TERM GOAL #2   Title pt will tell SLP why he is completing HEP with rare min A    Time 2    Period Weeks   or 5 total visits   Status On-going      SLP SHORT TERM GOAL #3   Title pt will demo swallow precautions with occasional min A in two sessions    Time 2    Status Deferred   pt now NPO     SLP SHORT TERM GOAL #4   Title pt will tell SLP 3 overt s/sx aspiration PNA in 2 sessions after SLP review of overt s/sx    Time 4    Period Weeks    Status On-going      SLP SHORT TERM GOAL #5   Title pt will complete 10 effortful swallows in 60 seconds    Time 4    Period Weeks    Status New            SLP Long Term Goals - 12/21/20 1032      SLP LONG TERM GOAL #1   Title pt will demo proper procedure for HEP with modified independence over 3 sessions    Time 8    Period Weeks   or 17 visits, for all LTGs   Status On-going      SLP LONG TERM GOAL #2   Title pt will perform 10 reps Mendelsohn in 75 seconds    Time 8    Period Weeks    Status On-going      SLP LONG TERM GOAL #3   Title pt will demo aspiration precauations with POs with occasional min A in 4  sessions total    Time 5    Status Deferred   pt now NPO           Plan - 12/21/20 1031    Clinical Impression Statement [name] presents today with severe dysphagia after FEES at [facility] on 12-03-20.See that report for details. NPO was recommended with ice chips/water with SLP after meticulous oral care. Intensive swallow therapy was recommended after FEES so pt's POC is changed today to be x2/week x8 weeks, or 17 total visits. SLP will need to continue to follow to track proper completion of HEP and to assess safety with POs (pt's following of aspiration precautions).    Speech Therapy Frequency 2x / week   once approx every 4 weeks   Duration 8 weeks   or 17 total sessions   Treatment/Interventions Aspiration precaution training;Pharyngeal strengthening exercises;Trials of upgraded texture/liquids;Diet toleration management by SLP;Internal/external aids;Patient/family education;SLP instruction and feedback;Environmental controls;Compensatory strategies    Potential to Achieve Goals Fair    Potential Considerations Previous level of function;Severity of impairments;Cooperation/participation level;Other (comment)   limited literacy   SLP Home Exercise Plan reviewed - Mendelsohn and Effortful swallow today    Consulted and Agree with Plan of Care Patient           Patient will benefit from skilled therapeutic intervention in order to improve the following deficits and impairments:   Pharyngeal dysphagia    Problem List Patient Active Problem List   Diagnosis Date Noted  . Chemotherapy-induced diarrhea 10/30/2020  . Port-A-Cath in place 10/23/2020  . Cancer of tonsillar fossa ([place]) 08/31/2020  . Laryngeal cancer ([place]) 01/06/2020  . Metastatic cancer to cervical lymph nodes ([place]) 12/01/2019  . Chronic hepatitis C without hepatic coma ([place]) 06/09/2019  . 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 Squamous cell carcinoma of neck 04/01/2019    [facility]  ,MS, CCC-SLP  12/21/2020, 10:40 AM  [facility] [address] [place], [place], [address] Phone: [phone]   Fax:  [phone]   Name: [name] MRN: [id number] Date of Birth: [birth year]

## 2020-12-21 NOTE — Progress Notes (Signed)
  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 He received reirradiation to the gross tumor in his throat and gross adenopathy in the neck, only.  Plan: The patient will follow-up with radiation oncology in 2-3 wks. -----------------------------------  [name], MD

## 2020-12-24 ENCOUNTER — Ambulatory Visit: Payer: Medicare HMO

## 2020-12-25 ENCOUNTER — Inpatient Hospital Stay: Payer: Medicare HMO | Attending: Hematology and Oncology | Admitting: Hematology and Oncology

## 2020-12-25 ENCOUNTER — Other Ambulatory Visit: Payer: Self-pay

## 2020-12-25 ENCOUNTER — Encounter: Payer: Self-pay | Admitting: Hematology and Oncology

## 2020-12-25 ENCOUNTER — Inpatient Hospital Stay: Payer: Medicare HMO | Admitting: Nutrition

## 2020-12-25 VITALS — BP 115/82 | HR 92 | Temp 98.0°F | Resp 18 | Wt 118.6 lb

## 2020-12-25 DIAGNOSIS — Z79899 Other long term (current) drug therapy: Secondary | ICD-10-CM | POA: Insufficient documentation

## 2020-12-25 DIAGNOSIS — C329 Malignant neoplasm of larynx, unspecified: Secondary | ICD-10-CM | POA: Diagnosis not present

## 2020-12-25 DIAGNOSIS — Z923 Personal history of irradiation: Secondary | ICD-10-CM | POA: Diagnosis not present

## 2020-12-25 DIAGNOSIS — C090 Malignant neoplasm of tonsillar fossa: Secondary | ICD-10-CM | POA: Diagnosis not present

## 2020-12-25 DIAGNOSIS — Z9221 Personal history of antineoplastic chemotherapy: Secondary | ICD-10-CM | POA: Diagnosis not present

## 2020-12-25 MED ORDER — ONDANSETRON HCL 4 MG/2ML IJ SOLN
4.0000 mg | Freq: Once | INTRAMUSCULAR | Status: AC
Start: 2020-12-25 — End: 2020-12-25
  Administered 2020-12-25: 4 mg via INTRAVENOUS

## 2020-12-25 MED ORDER — ONDANSETRON HCL 4 MG/2ML IJ SOLN
INTRAMUSCULAR | Status: AC
  Filled 2020-12-25: qty 2

## 2020-12-25 NOTE — Progress Notes (Signed)
Nutrition follow-up completed with patient status post treatment for recurrent laryngeal cancer. Weight decreased and documented as 118.6 pounds on April 12.  This is decreased from 130.7 pounds February 15. Reports he thinks his tube is not working properly.  States it takes too long for Osmolite to infuse.  First reports only using 2-3 cartons of Osmolite daily and then reports 2 cartons Osmolite 1.5 - 3 times daily. Reports he is out of nausea medication as well as Ensure which he is drinking by mouth.   Patient agreeable to demonstrating infusion of Osmolite 1.5 with free water flush.  Estimated nutrition needs: 2000-2200 cal, 80-95 g protein, 2.2 L fluid.  Nutrition diagnosis: Unintended weight loss continues.  Intervention: Instructed patient on the importance of increasing Osmolite 1.5-2 cartons 3 times daily with 120 mL free water before and after bolus feedings.  This provides 2130 cal, 89.4 g protein, 2352 mL free water. Educated patient to consume Ensure Plus by mouth as tolerated. Patient demonstrated appropriate tube feeding administration.  Osmolite 1.5 infused into the feeding tube at an appropriate rate.  Patient demonstrated appropriate administration. Recommended patient sip on water throughout the day.  Monitoring, evaluation, goals: Patient will tolerate adequate tube feeding plus oral intake to meet minimum estimated nutrition needs.  Next visit: Tuesday, May 17 after MD visit.  **Disclaimer: This note was dictated with voice recognition software. Similar sounding words can inadvertently be transcribed and this note may contain transcription errors which may not have been corrected upon publication of note.**

## 2020-12-25 NOTE — Progress Notes (Signed)
[facility] NOTE  Patient Care Team: [name], MD as PCP - General (Internal Medicine) Izora Gala, MD as Consulting Physician (Otolaryngology) [name], MD as Attending Physician (Radiation Oncology) Leota Sauers, RN (Inactive) as Oncology Nurse Navigator Karie Mainland, RD as Dietitian (Nutrition) Valentino Saxon Perry [place], CCC-SLP as Speech Language Pathologist (Speech Pathology) Kennith Center, LCSW as Social Worker [name], MD as Consulting Physician (Hematology and Oncology) Malmfelt, Stephani Police, RN as Oncology Nurse Navigator  CHIEF COMPLAINTS/PURPOSE OF CONSULTATION:  Recurrent SCC of larynx  ASSESSMENT & PLAN:   No problem-specific Assessment & Plan notes found for this encounter.  No orders of the defined types were placed in this encounter.  This is a very pleasant 70 year old male patient with history of recurrent laryngeal cancer now with a right oropharyngeal mass referred to medical oncology for further recommendations.  Please refer to the below mentioned history.   He was first diagnosed with T2N0 laryngeal cancer back in August 2020 and underwent definitive radiotherapy since he refused surgical treatment.  He then had a recurrence in Oct 2021 and had modified radical neck dissection, now presented with soft tissue thickening and enhancement along the right lateral pharynx. PET showed recurrence of head and neck cancer with broad hypermetabolic pharyngeal mucosal lesion involving right lateral posterior oropharynx and hypopharynx extending from palatine tonsil to the vallecula.Hypermetabolic lymph node posterior sternocleidomastoid muscle on the RIGHT (level   He saw Dr. Conley Canal from [facility], and recommendation is to proceed with chemo radiation first and consider salvage surgery if incomplete response. He is s/p five six weekly cycles of cisplatin chemotherapy, completed on 10/30/2020 Completed radiation on 10/31/2020 He is  here for FU. He continues to have sore throat, only eating soups and using G tube for feeding. No clinical evidence of residual disease PET ordered by Dr Pearlie Oyster team, he will FU with me after PET imaging.  2. Nausea, no clear etiology Reviewed all his medications and advised him to use PRN nausea meds. We will also give him some IV zofran today.  3 Mucositis secondary to anti neoplastic therapy. Recovered well.  4. Weight changes, lost about 12 lbs. Advised to at least use 6 bottles of osmolite a day. He also had a FU with nutritionist today He is only using 2/3 a day  6, chemotherapy induced cytopenia Will repeat labs today    All his questions were answered to the best of my knowledge.   Thank you for consulting [place] in the care of this patient.  HISTORY OF PRESENTING ILLNESS:   [name] 70 y.o. male is here because of laryngeal cancer  [name] is a 70 y.o. male who has been treated for laryngeal cancer in the past.   Aug 2020, Pt had presented with dysphagia and difficulty breathing through his mouth, 20 lb weight loss. He saw Dr. Erik Obey who performed laryngoscopy and appreciated a 2 to 3 cm ovoid pedunculated mass arising from the right aryepiglottic fold and impinging on the supraglottic airway.  The vocal cords appeared to be mobile.  Urgent panendoscopy and biopsy were recommended to protect his airway.  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 According to his op note,a bulky necrotic and semi-pedunculated tumor coming off the lateral surface of the right aryepiglottic fold and the anterior and lateral aspect of the piriform sinus on the right side. Airway was compromised by the tumor mass at  intubation.  CT scan of the neck on 04/11/2019 showed glottic closure with no asymmetry of the cords, correlate with laryngoscopy results.  Questionable right level 2 lymph node  measured 7 mm.  No definite pathologic lymph nodes in the neck.  PET scan on 04/22/2019 revealed mild residual activity in the posterior right hypopharynx confined to the mucosa with no evidence of metastatic disease to the neck or distantly.  He underwent definitive radiotherapy to T2N0 laryngeal cancer  since he refused surgical treatment.  He then had PET imaging which showed a right neck lymph node, suspicious on PET. Biopsy of right neck lymph node is positive for recurrence. He underwent right modified radical neck dissection including levels 2 and 3, including the internal jugular vein, suture marks the inferior jugular vein stump.  He most recently underwent a soft tissue neck CT scan on the date of 07/05/2020 that revealed asymmetric soft tissue thickening and enhancement along the right lateral pharynx. It also showed suspicious small 6 mm hyperenhancing nodular soft tissue along the posterior margin of the neck dissect at right level 3. Additionally, there was a mild inflammatory appearing right upper lobe centrilobular ground-glass opacity that was new (consider mild or developing upper lobe infection), post radiation changes to the lung apices, and sequelae of radiation and right neck dissection from previous neck CT.  Subsequently, the patient underwent a direct laryngoscopy and biopsy of oropharyngeal mass on the date of 08/13/2020. Pathology from the procedure revealed poorly differentiated squamous cell carcinoma.  PET showed recurrence of head and neck cancer with broad hypermetabolic pharyngeal mucosal lesion involving right lateral posterior oropharynx and hypopharynx extending from palatine tonsil to the vallecula.Hypermetabolic lymph node posterior sternocleidomastoid muscle on the RIGHT (level 3).Hypermetabolic nodule within the LEFT parotid glands favored small primary parotid neoplasm.  Recommendation was to consider proceeding with upfront concurrent chemo radiation followed  by salvage surgery if needed. He completed 6 weekly cycles of chemotherapy and concurrent radiation.   Interim History  He is here for follow up after completion of chemotherapy He says he still has sore throat, able to eat mostly soups. He is feeling a bit nauseated today and was wondering if we can give him IV Zofran. He denies any changes in breathing, occasional cough. No change in hearing,  No change in bowel habits He lost about 15 lbs since last visit, only 2-3 bottles of gevity in G tube.  Rest of the pertinent 10 point ROS reviewed and negative.  MEDICAL HISTORY:  Past Medical History:  Diagnosis Date  . Cancer ([place])    Throat cancer 2019  . ETOH abuse   . Frequent urination   . Glaucoma   . Hepatitis C virus infection without hepatic coma    dx'ed in 11/2018  . History of radiation therapy 05/12/19- 07/06/19   Larynx  . Hypertension   . Wears denture    upper only; lost lower denture    SURGICAL HISTORY: Past Surgical History:  Procedure Laterality Date  . ANKLE SURGERY  2011   right ankle  . COLONOSCOPY  02/2019   polyps - Dr Havery Moros  . DIRECT LARYNGOSCOPY N/A 04/01/2019   Procedure: DIRECT LARYNGOSCOPY WITH BIOPSY;  Surgeon: Jodi Marble, MD;  Location: [place] [facility];  Service: ENT;  Laterality: N/A;  . DIRECT LARYNGOSCOPY N/A 01/06/2020   Procedure: DIRECT LARYNGOSCOPY;  Surgeon: Izora Gala, MD;  Location: [place];  Service: ENT;  Laterality: N/A;  . DIRECT LARYNGOSCOPY N/A 08/13/2020   Procedure: DIRECT LARYNGOSCOPY;  Surgeon: Izora Gala, MD;  Location: [place];  Service: ENT;  Laterality: N/A;  . ESOPHAGOSCOPY N/A 04/01/2019   Procedure: ESOPHAGOSCOPY;  Surgeon: Jodi Marble, MD;  Location: [place];  Service: ENT;  Laterality: N/A;  . EXCISION ORAL TUMOR Right 08/13/2020   Procedure: BIOPSY OF OROPHARYNGEAL MASS;  Surgeon: Izora Gala, MD;  Location: [place];  Service: ENT;  Laterality: Right;  . EYE SURGERY Right   . IR  CM INJ ANY COLONIC TUBE W/FLUORO  12/20/2020  . IR GASTROSTOMY TUBE MOD SED  09/24/2020  . IR IMAGING GUIDED PORT INSERTION  09/24/2020  . KNEE SURGERY    . LARYNGOSCOPY AND BRONCHOSCOPY N/A 04/01/2019   Procedure: BRONCHOSCOPY;  Surgeon: Jodi Marble, MD;  Location: [facility];  Service: ENT;  Laterality: N/A;  . RADICAL NECK DISSECTION N/A 01/06/2020   Procedure: RADICAL NECK DISSECTION;  Surgeon: Izora Gala, MD;  Location: [facility];  Service: ENT;  Laterality: N/A;    SOCIAL HISTORY: Social History   Socioeconomic History  . Marital status: Single    Spouse name: Not on file  . Number of children: 2  . Years of education: Not on file  . Highest education level: Not on file  Occupational History  . Not on file  Tobacco Use  . Smoking status: Former Smoker    Years: 50.00    Types: Cigarettes    Quit date: 06/01/2020    Years since quitting: 0.5  . Smokeless tobacco: Never Used  Vaping Use  . Vaping Use: Never used  Substance and Sexual Activity  . Alcohol use: Yes    Alcohol/week: 4.0 standard drinks    Types: 4 Cans of beer per week    Comment: 40oz beer daily  . Drug use: Yes    Types: Cocaine    Comment: none in 2 yrs  . Sexual activity: Yes    Partners: Female  Other Topics Concern  . Not on file  Social History Narrative   Patient is divorced with 2 children.   Patient is currently living with his sister.   Patient with a history of smoking a third of pack of cigarettes daily for 50 years.  Patient currently smoking 2 to 3 cigarettes/day.   Patient has never used smokeless tobacco.   Patient with occasional use of alcohol.   Patient last used cocaine approximately 6 months ago.  Patient denies use of marijuana.   Social Determinants of Health   Financial Resource Strain: Not on file  Food Insecurity: No Food Insecurity  . Worried About Charity fundraiser in the Last Year: Never true  . Ran Out of Food in the Last Year: Never true  Transportation Needs: No  Transportation Needs  . Lack of Transportation (Medical): No  . Lack of Transportation (Non-Medical): No  Physical Activity: Not on file  Stress: Not on file  Social Connections: Unknown  . Frequency of Communication with Friends and Family: Three times a week  . Frequency of Social Gatherings with Friends and Family: More than three times a week  . Attends Religious Services: Not on file  . Active Member of Clubs or Organizations: Not on file  . Attends Archivist Meetings: Not on file  . Marital Status: Not on file  Intimate Partner Violence: Not on file    FAMILY HISTORY: Family History  Problem Relation Age of Onset  . Breast cancer Sister   . Colon cancer Brother 41       ????  .  Cancer Brother     ALLERGIES:  has No Known Allergies.  MEDICATIONS:  Current Outpatient Medications  Medication Sig Dispense Refill  . traMADol (ULTRAM) 50 MG tablet Take 1 tablet by mouth every 6 (six) hours as needed.    Marland Kitchen atropine 1 % ophthalmic solution Place 1 drop into the right eye 2 (two) times a day.     . cetirizine (ZYRTEC) 10 MG tablet Take 10 mg by mouth daily as needed for allergies.    Marland Kitchen dexamethasone (DECADRON) 4 MG tablet Take 2 tablets (8 mg total) by mouth daily. Take daily x 3 days starting the day after cisplatin chemotherapy. Take with food. 30 tablet 1  . docusate sodium (COLACE) 50 MG capsule Take 1 capsule (50 mg total) by mouth 2 (two) times daily. 10 capsule 0  . HYDROcodone-acetaminophen (HYCET) 7.5-325 mg/15 ml solution Take 10 mLs by mouth 4 (four) times daily as needed for moderate pain (Cancer pain). 473 mL 0  . ibuprofen (ADVIL) 200 MG tablet Take 400 mg by mouth every 6 (six) hours as needed for headache or moderate pain.    Marland Kitchen latanoprost (XALATAN) 0.005 % ophthalmic solution Place 1 drop into the right eye at bedtime.     . lidocaine (XYLOCAINE) 2 % solution Patient: Mix 1part 2% viscous lidocaine, 1part H20. Swish AND swallow 59mL of diluted mixture,  84min before meals and at bedtime, up to QID 200 mL 3  . lidocaine-prilocaine (EMLA) cream Apply to affected area once (Patient taking differently: Apply 1 application topically daily as needed (port access). Apply to affected area once) 30 g 3  . LORazepam (ATIVAN) 0.5 MG tablet Take 1 tablet (0.5 mg total) by mouth every 6 (six) hours as needed (Nausea or vomiting). 30 tablet 0  . losartan (COZAAR) 50 MG tablet Take 50 mg by mouth daily.    . Nutritional Supplements (FEEDING SUPPLEMENT, OSMOLITE 1.5 CAL,) LIQD Give 1 carton Osmolite 1.5 via PEG 4 times daily with 90 mL free water before and after bolus feeding.  Increase to 1-1/2 cartons 4 times daily.  In addition, drink or flush tube with an additional 240 mL free water twice daily.  This provides 2130 cal, 89.4 g protein and 2352 mL free water which is 100% estimated needs. 1422 mL 0  . ondansetron (ZOFRAN) 8 MG tablet Take 1 tablet (8 mg total) by mouth 2 (two) times daily as needed. Start on the third day after cisplatin chemotherapy. 30 tablet 1  . prednisoLONE acetate (PRED FORTE) 1 % ophthalmic suspension Place 1 drop into both eyes daily.    . prochlorperazine (COMPAZINE) 10 MG tablet Take 1 tablet (10 mg total) by mouth every 6 (six) hours as needed (Nausea or vomiting). 30 tablet 1  . tolterodine (DETROL LA) 4 MG 24 hr capsule Take 4 mg by mouth daily.    . Vitamin D, Ergocalciferol, (DRISDOL) 1.25 MG (50000 UNIT) CAPS capsule Take 50,000 Units by mouth once a week.     No current facility-administered medications for this visit.     PHYSICAL EXAMINATION: ECOG PERFORMANCE STATUS: 1 - Symptomatic but completely ambulatory  Vitals:   12/25/20 1226  BP: 115/82  Pulse: 92  Resp: 18  Temp: 98 F (36.7 C)  SpO2: 100%   Filed Weights   12/25/20 1226  Weight: 118 lb 9.6 oz (53.8 kg)    GENERAL:alert, no distress and comfortable. SKIN: skin color, texture, turgor are normal, no rashes or significant lesions EYES: normal,  conjunctiva  are pink and non-injected, sclera clear NECK: No palpable LN, LUNGS: No adv sounds. CTA B/L HEART: regular rate & rhythm and no murmurs and no lower extremity edema ABDOMEN:abdomen soft, non-tender and normal bowel sounds. Peg site looks well. Musculoskeletal:no cyanosis of digits and no clubbing  PSYCH: alert & oriented x 3 with fluent speech NEURO: no focal motor/sensory deficits  LABORATORY DATA:  I have reviewed the data as listed Lab Results  Component Value Date   WBC 2.5 (L) 10/30/2020   HGB 10.3 (L) 10/30/2020   HCT 30.4 (L) 10/30/2020   MCV 91.0 10/30/2020   PLT 132 (L) 10/30/2020     Chemistry      Component Value Date/Time   NA 131 (L) 10/30/2020 0909   K 4.1 10/30/2020 0909   CL 97 (L) 10/30/2020 0909   CO2 26 10/30/2020 0909   BUN 13 10/30/2020 0909   CREATININE 0.73 10/30/2020 0909   CREATININE 0.61 (L) 10/26/2019 1523      Component Value Date/Time   CALCIUM 8.6 (L) 10/30/2020 0909   ALKPHOS 41 01/03/2020 1355   AST 17 01/03/2020 1355   AST 60 (H) 05/04/2019 1207   ALT 14 01/03/2020 1355   ALT 53 (H) 09/06/2019 1543   BILITOT 0.5 01/03/2020 1355   BILITOT 0.5 05/04/2019 1207     Labs today  RADIOGRAPHIC STUDIES: I have personally reviewed the radiological images as listed and agreed with the findings in the report. IR Cm Inj Any Colonic Tube W/Fluoro  Result Date: 12/20/2020 INDICATION: History of pull-through gastrostomy tube placement 09/24/2020 now with dysfunction of the gastrostomy tube adapter device as well as complaint of gastrostomy tube leakage. Patient presents today for fluoroscopic guided gastrostomy tube evaluation. EXAM: FLUOROSCOPIC GUIDED REPLACEMENT OF GASTROSTOMY TUBE COMPARISON:  Image guided gastrostomy tube placement-09/24/2020 MEDICATIONS: None. CONTRAST:  20 cc-administered into the gastric lumen FLUOROSCOPY TIME:  18 seconds COMPLICATIONS: None immediate. PROCEDURE: A timeout was performed prior to the initiation of the  procedure. Patient was placed supine on the fluoroscopy table. The fractured gastrostomy tube adaptor device was removed and a new adapter was applied. The external disc of the gastrostomy tube was cinched against the patient's skin and contrast injection was performed demonstrating appropriate position functionality of the gastrostomy tube. Multiple spot fluoroscopic images were obtained confirming appropriate positioning and functionality of the new gastrostomy tube. A dressing was applied. The patient tolerated the procedure well without immediate postprocedural complication. IMPRESSION: 1. Successful bedside exchange of gastrostomy tube adapter. 2. Complain of gastrostomy tube leakage is likely secondary to incomplete opposition the retention disc which was secured against the ventral aspect the abdomen. Contrast injection demonstrates otherwise appropriate position functionality of the gastrostomy tube. The gastrostomy tube is ready for immediate use. Electronically Signed   By: Sandi Mariscal M.D.   On: 12/20/2020 15:17    All questions were answered. The patient knows to call the clinic with any problems, questions or concerns.  I spent 30 minutes in the care of this patient including H and P< review of records, counseling and coordination of care.    [name], MD 12/25/2020 12:58 PM

## 2020-12-26 ENCOUNTER — Ambulatory Visit: Payer: Medicare HMO

## 2020-12-26 DIAGNOSIS — R1313 Dysphagia, pharyngeal phase: Secondary | ICD-10-CM | POA: Diagnosis not present

## 2020-12-26 NOTE — Therapy (Signed)
[facility] [address] [place], [place], [address] Phone: [phone]   Fax:  [phone]  Patient Details  Name: [name] MRN: [id number] Date of Birth: 02/07/1951 Referring Provider:  [name], MD  Encounter Date: 12/26/2020   Alinda Deem 12/26/2020, 10:49 AM  [facility] [address] [place], [place], [address] Phone: [phone]   Fax:  [phone]

## 2020-12-26 NOTE — Therapy (Signed)
[facility] [address] [place], [place], [address] Phone: [phone]   Fax:  [phone]  Speech Language Pathology Treatment  Patient Details  Name: [name] MRN: [id number] Date of Birth: [birth year] Referring Provider (SLP): [name], MD   Encounter Date: 12/26/2020   End of Session - 12/26/20 1118    Visit Number 5    Number of Visits 17    Date for SLP Re-Evaluation 03/13/21    Authorization Type humana    SLP Start Time 0623    SLP Stop Time  1030    SLP Time Calculation (min) 15 min    Activity Tolerance Patient limited by pain;Other (comment)   N/V          Past Medical History:  Diagnosis Date  . Cancer ([place])    Throat cancer 2019  . ETOH abuse   . Frequent urination   . Glaucoma   . Hepatitis C virus infection without hepatic coma    dx'ed in 11/2018  . History of radiation therapy 05/12/19- 07/06/19   Larynx  . Hypertension   . Wears denture    upper only; lost lower denture    Past Surgical History:  Procedure Laterality Date  . ANKLE SURGERY  2011   right ankle  . COLONOSCOPY  02/2019   polyps - Dr Havery Moros  . DIRECT LARYNGOSCOPY N/A 04/01/2019   Procedure: DIRECT LARYNGOSCOPY WITH BIOPSY;  Surgeon: Jodi Marble, MD;  Location: [facility];  Service: ENT;  Laterality: N/A;  . DIRECT LARYNGOSCOPY N/A 01/06/2020   Procedure: DIRECT LARYNGOSCOPY;  Surgeon: Izora Gala, MD;  Location: [facility];  Service: ENT;  Laterality: N/A;  . DIRECT LARYNGOSCOPY N/A 08/13/2020   Procedure: DIRECT LARYNGOSCOPY;  Surgeon: Izora Gala, MD;  Location: [facility];  Service: ENT;  Laterality: N/A;  . ESOPHAGOSCOPY N/A 04/01/2019   Procedure: ESOPHAGOSCOPY;  Surgeon: Jodi Marble, MD;  Location: [facility];  Service: ENT;  Laterality: N/A;  . EXCISION ORAL TUMOR Right 08/13/2020   Procedure: BIOPSY OF OROPHARYNGEAL MASS;  Surgeon: Izora Gala, MD;  Location: [facility];   Service: ENT;  Laterality: Right;  . EYE SURGERY Right   . IR CM INJ ANY COLONIC TUBE W/FLUORO  12/20/2020  . IR GASTROSTOMY TUBE MOD SED  09/24/2020  . IR IMAGING GUIDED PORT INSERTION  09/24/2020  . KNEE SURGERY    . LARYNGOSCOPY AND BRONCHOSCOPY N/A 04/01/2019   Procedure: BRONCHOSCOPY;  Surgeon: Jodi Marble, MD;  Location: [facility];  Service: ENT;  Laterality: N/A;  . RADICAL NECK DISSECTION N/A 01/06/2020   Procedure: RADICAL NECK DISSECTION;  Surgeon: Izora Gala, MD;  Location: [facility];  Service: ENT;  Laterality: N/A;    There were no vitals filed for this visit.   Subjective Assessment - 12/26/20 1015    Subjective "not good. I've been throwing up"    Currently in Pain? Yes    Pain Score 8     Pain Location Throat    Pain Descriptors / Indicators Sore                 ADULT SLP TREATMENT - 12/26/20 1021      General Information   Behavior/Cognition Pleasant mood;Lethargic;Requires cueing;Distractible      Treatment Provided   Treatment provided Dysphagia      Dysphagia Treatment   Treatment Methods Patient/caregiver education    Other treatment/comments Pt reports he went to [facility] apt yesterday who provided IV  and refill of nausea medication. Pt reports feeling nausated this session, with pt leaving room x3 with salvia reportedly expelled. SLP reviewed previously provided education of s/sx of aspiration PNA, in which pt able to state 3 s/sx after education. SLP educated patient on NPO recommendation per most recent MBSS, given some concern pt is consuming thin liquids by mouth. Pt verbalized understanding of NPO recommendation and risks of aspiration PNA. Pt reports he has been completing oral care and swallow exercises BID but pt did not feel able to complete sw ex at this time due to nausea.  Pt only tolerated 15 minutes of ST session prior to endorsing need to return home.      Assessment / Recommendations / Plan   Plan Continue with current plan of care      Progression  Toward Goals   Progression toward goals Progressing toward goals            SLP Education - 12/26/20 1118    Education Details s/sx of aspiration, thorough oral care 2-3x/day, importance of HEP    Person(s) Educated Patient    Methods Explanation    Comprehension Verbalized understanding;Returned demonstration;Need further instruction            SLP Short Term Goals - 12/26/20 1010      SLP SHORT TERM GOAL #1   Title pt will demo correct procedure for HEP with rare min A, in 2 sessions    Time 3    Period Weeks   or 9 visits, for all STGs except STG #2   Status On-going      SLP SHORT TERM GOAL #2   Title pt will tell SLP why he is completing HEP with rare min A    Baseline 12-26-20    Time 1    Period Weeks   or 5 total visits   Status On-going      SLP SHORT TERM GOAL #3   Title pt will demo swallow precautions with occasional min A in two sessions    Time 1    Status Deferred   pt now NPO     SLP SHORT TERM GOAL #4   Title pt will tell SLP 3 overt s/sx aspiration PNA in 2 sessions after SLP review of overt s/sx    Baseline 12-26-20    Time 3    Period Weeks    Status On-going      SLP SHORT TERM GOAL #5   Title pt will complete 10 effortful swallows in 60 seconds    Time 3    Period Weeks    Status On-going            SLP Long Term Goals - 12/26/20 1010      SLP LONG TERM GOAL #1   Title pt will demo proper procedure for HEP with modified independence over 3 sessions    Time 7    Period Weeks   or 17 visits, for all LTGs   Status On-going      SLP LONG TERM GOAL #2   Title pt will perform 10 reps Mendelsohn in 75 seconds    Time 7    Period Weeks    Status On-going      SLP LONG TERM GOAL #3   Title pt will demo aspiration precauations with POs with occasional min A in 4 sessions total    Time --    Status Deferred   pt now NPO  Plan - 12/26/20 1119    Clinical Impression Statement [name] presents today with severe dysphagia after  FEES at [facility] on 12-03-20.See that report for details. NPO was recommended with ice chips/water with SLP only after meticulous oral care. Intensive swallow therapy was recommended after FEES. Pt reports he is completing recommended HEP and oral care BID. SLP re-educated patient on s/sx of aspiration as there is concern pt is not following NPO recommendation. Pt able to state 3 s/sx of aspiration PNA following education. Due to reported N/V, pt unable to participate in swallow exercises this session, with ST session shortened due to reported nausea. SLP will need to continue to follow to track proper completion of HEP and to assess safety with POs (pt's following of aspiration precautions).    Speech Therapy Frequency 2x / week   once approx every 4 weeks   Duration 8 weeks   or 17 total sessions   Treatment/Interventions Aspiration precaution training;Pharyngeal strengthening exercises;Trials of upgraded texture/liquids;Diet toleration management by SLP;Internal/external aids;Patient/family education;SLP instruction and feedback;Environmental controls;Compensatory strategies    Potential to Achieve Goals Fair    Potential Considerations Previous level of function;Severity of impairments;Cooperation/participation level;Other (comment)    SLP Home Exercise Plan reviewed - s/sx of aspiration, oral care, HEP    Consulted and Agree with Plan of Care Patient           Patient will benefit from skilled therapeutic intervention in order to improve the following deficits and impairments:   Pharyngeal dysphagia    Problem List Patient Active Problem List   Diagnosis Date Noted  . Chemotherapy-induced diarrhea 10/30/2020  . Port-A-Cath in place 10/23/2020  . Cancer of tonsillar fossa ([place]) 08/31/2020  . Laryngeal cancer ([place]) 01/06/2020  . Metastatic cancer to cervical lymph nodes ([place]) 12/01/2019  . Chronic hepatitis C without hepatic coma ([place]) 06/09/2019  . 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 Squamous cell carcinoma of neck 04/01/2019    Alinda Deem, MA CCC-SLP 12/26/2020, 11:31 AM  [facility] [address] [place], [place], [address] Phone: [phone]   Fax:  [phone]   Name: [name] MRN: [id number] Date of Birth: 07/18/1951

## 2020-12-31 ENCOUNTER — Ambulatory Visit: Payer: Medicare HMO

## 2021-01-02 ENCOUNTER — Ambulatory Visit: Payer: Medicare HMO

## 2021-01-02 ENCOUNTER — Other Ambulatory Visit: Payer: Self-pay

## 2021-01-02 DIAGNOSIS — R1313 Dysphagia, pharyngeal phase: Secondary | ICD-10-CM | POA: Diagnosis not present

## 2021-01-03 NOTE — Therapy (Signed)
[facility] [address] [place], [place], [address] Phone: [phone]   Fax:  [phone]  Speech Language Pathology Treatment  Patient Details  Name: [name] MRN: [id number] Date of Birth: 26-May-1951 Referring Provider (SLP): [name], MD   Encounter Date: 01/02/2021   End of Session - 01/03/21 0040    Visit Number 6    Number of Visits 17    Date for SLP Re-Evaluation 03/13/21    Authorization Type humana    SLP Start Time 1105    SLP Stop Time  1145    SLP Time Calculation (min) 40 min    Activity Tolerance Patient limited by pain   N/V          Past Medical History:  Diagnosis Date  . Cancer ([place])    Throat cancer 2019  . ETOH abuse   . Frequent urination   . Glaucoma   . Hepatitis C virus infection without hepatic coma    dx'ed in 11/2018  . History of radiation therapy 05/12/19- 07/06/19   Larynx  . Hypertension   . Wears denture    upper only; lost lower denture    Past Surgical History:  Procedure Laterality Date  . ANKLE SURGERY  2011   right ankle  . COLONOSCOPY  02/2019   polyps - Dr Havery Moros  . DIRECT LARYNGOSCOPY N/A 04/01/2019   Procedure: DIRECT LARYNGOSCOPY WITH BIOPSY;  Surgeon: Jodi Marble, MD;  Location: [name];  Service: ENT;  Laterality: N/A;  . DIRECT LARYNGOSCOPY N/A 01/06/2020   Procedure: DIRECT LARYNGOSCOPY;  Surgeon: Izora Gala, MD;  Location: [facility];  Service: ENT;  Laterality: N/A;  . DIRECT LARYNGOSCOPY N/A 08/13/2020   Procedure: DIRECT LARYNGOSCOPY;  Surgeon: Izora Gala, MD;  Location: [facility];  Service: ENT;  Laterality: N/A;  . ESOPHAGOSCOPY N/A 04/01/2019   Procedure: ESOPHAGOSCOPY;  Surgeon: Jodi Marble, MD;  Location: [facility];  Service: ENT;  Laterality: N/A;  . EXCISION ORAL TUMOR Right 08/13/2020   Procedure: BIOPSY OF OROPHARYNGEAL MASS;  Surgeon: Izora Gala, MD;  Location: [facility];  Service: ENT;   Laterality: Right;  . EYE SURGERY Right   . IR CM INJ ANY COLONIC TUBE W/FLUORO  12/20/2020  . IR GASTROSTOMY TUBE MOD SED  09/24/2020  . IR IMAGING GUIDED PORT INSERTION  09/24/2020  . KNEE SURGERY    . LARYNGOSCOPY AND BRONCHOSCOPY N/A 04/01/2019   Procedure: BRONCHOSCOPY;  Surgeon: Jodi Marble, MD;  Location: [facility];  Service: ENT;  Laterality: N/A;  . RADICAL NECK DISSECTION N/A 01/06/2020   Procedure: RADICAL NECK DISSECTION;  Surgeon: Izora Gala, MD;  Location: [facility];  Service: ENT;  Laterality: N/A;    There were no vitals filed for this visit.   Subjective Assessment - 01/02/21 1114    Subjective Pt entered using a "spit cup" today - reports he completed thorough oral care <30 minutes prior to start of ST today.    Currently in Pain? Yes    Pain Score 8     Pain Location Throat    Pain Orientation Mid    Pain Descriptors / Indicators Sore    Pain Type Acute pain    Pain Onset More than a month ago    Pain Frequency Constant    Aggravating Factors  swallow                 ADULT SLP TREATMENT - 01/03/21 0001  General Information   Behavior/Cognition Pleasant mood;Lethargic;Requires cueing;Distractible      Treatment Provided   Treatment provided Dysphagia      Dysphagia Treatment   Temperature Spikes Noted --   pt woke up this morning "sweating", reports looks like he is clearing pus when expectorating, incr'd feeling throat "closing up" on him, and pain incr'd to 8/10 - all since med onc f/u last week - med onc and rad onc notified   Respiratory Status Room air    Oral Cavity - Dentition Edentulous    Treatment Methods Skilled observation;Patient/caregiver education;Therapeutic exercise    Patient observed directly with PO's Yes    Type of PO's observed Thin liquids    Liquids provided via Teaspoon   1/16 teaspoon   Oral Phase Signs & Symptoms Other (comment)   none observed   Pharyngeal Phase Signs & Symptoms Multiple swallows;Wet vocal quality;Immediate  throat clear;Complaints of residue    Other treatment/comments Pt tells SLP he had some chicken noodle soup last night until he "started coughing bad" and then ceased. Pt also reported having juices occasionally. SLP reminded pt strongly about NPO recommendation including PO water or ice only with SLP. With1/16 teaspoon boluses x5 pt with above s/sx. Pt performed HEP of effortful and Mendelsohn with modified independence today - fatigue noted with each after 3 reps. SLP questions PNA given pt's symptoms reported (producing "pus"-like sputum, incr'd pain and feeling of incr'd edema in pharyx.      Assessment / Recommendations / Plan   Plan Continue with current plan of care   contact med onc and rad onc     Progression Toward Goals   Progression toward goals Progressing toward goals            SLP Education - 01/03/21 0039    Education Details NPO means NOTHING by mouth, reiterated rationale for NPO, ramifications of repeated aspiration (PNA)    Person(s) Educated Patient    Methods Explanation    Comprehension Verbalized understanding            SLP Short Term Goals - 01/03/21 0042      SLP SHORT TERM GOAL #1   Title pt will demo correct procedure for HEP with rare min A, in 2 sessions    Baseline 01/02/21    Time 2    Period Weeks   or 9 visits, for all STGs except STG #2   Status On-going      SLP SHORT TERM GOAL #2   Title pt will tell SLP why he is completing HEP with rare min A    Baseline 12-26-20    Period --   or 5 total visits   Status Achieved      SLP SHORT TERM GOAL #3   Title pt will demo swallow precautions with occasional min A in two sessions    Time 1    Status Deferred   pt now NPO     SLP SHORT TERM GOAL #4   Title pt will tell SLP 3 overt s/sx aspiration PNA in 2 sessions after SLP review of overt s/sx    Baseline 12-26-20    Time 2    Period Weeks    Status On-going      SLP SHORT TERM GOAL #5   Title pt will complete 10 effortful swallows in 60  seconds    Time 2    Period Weeks    Status On-going  SLP Long Term Goals - 01/03/21 0043      SLP LONG TERM GOAL #1   Title pt will demo proper procedure for HEP with modified independence over 3 sessions    Time 6    Period Weeks   or 17 visits, for all LTGs   Status On-going      SLP LONG TERM GOAL #2   Title pt will perform 10 reps Mendelsohn in 75 seconds    Time 6    Period Weeks    Status On-going      SLP LONG TERM GOAL #3   Title pt will demo aspiration precauations with POs with occasional min A in 4 sessions total    Status Deferred   pt now NPO           Plan - 01/03/21 0041    Clinical Impression Statement [place] presents today with severe dysphagia after FEES at [facility] on 12-03-20.See that report for details. NPO was recommended with ice chips/water with SLP after meticulous oral care. Pt cont to have soup and juice PO, despite SLP telling pt last session that POs were ONLY water or ice chips with SLP after meticulous oral care. Intensive swallow therapy was recommended after FEES. See "other comments" for more details of today's session. SLP will need to continue to follow to track proper completion of HEP and to assess safety with POs (pt's following of aspiration precautions).    Speech Therapy Frequency 2x / week   once approx every 4 weeks   Duration 8 weeks   or 17 total sessions   Treatment/Interventions Aspiration precaution training;Pharyngeal strengthening exercises;Trials of upgraded texture/liquids;Diet toleration management by SLP;Internal/external aids;Patient/family education;SLP instruction and feedback;Environmental controls;Compensatory strategies    Potential to Achieve Goals Fair    Potential Considerations Previous level of function;Severity of impairments;Cooperation/participation level;Other (comment)   limited literacy   SLP Home Exercise Plan reviewed - Mendelsohn and Effortful swallow today    Consulted and Agree with Plan of  Care Patient           Patient will benefit from skilled therapeutic intervention in order to improve the following deficits and impairments:   Pharyngeal dysphagia    Problem List Patient Active Problem List   Diagnosis Date Noted  . Chemotherapy-induced diarrhea 10/30/2020  . Port-A-Cath in place 10/23/2020  . Cancer of tonsillar fossa ([place]) 08/31/2020  . Laryngeal cancer ([place]) 01/06/2020  . Metastatic cancer to cervical lymph nodes ([place]) 12/01/2019  . Chronic hepatitis C without hepatic coma ([place]) 06/09/2019  . 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[place]) 05/02/2019  . Squamous cell carcinoma of neck 04/01/2019    [facility] ,MS, CCC-SLP  01/03/2021, 12:43 AM  [facility] [address] [place], [place], [address] Phone: [phone]   Fax:  [phone]   Name: [name] MRN: [id number] Date of Birth: August 05, 1951

## 2021-01-07 ENCOUNTER — Other Ambulatory Visit: Payer: Self-pay

## 2021-01-07 ENCOUNTER — Telehealth: Payer: Self-pay | Admitting: Hematology and Oncology

## 2021-01-07 ENCOUNTER — Ambulatory Visit: Payer: Medicare HMO

## 2021-01-07 DIAGNOSIS — R1313 Dysphagia, pharyngeal phase: Secondary | ICD-10-CM

## 2021-01-07 NOTE — Telephone Encounter (Signed)
Scheduled per 04/25 scheduled message, patient has been called and notified.

## 2021-01-07 NOTE — Therapy (Signed)
[facility] [address] [place], [place], [address] Phone: [phone]   Fax:  [phone]  Speech Language Pathology Treatment  Patient Details  Name: [name] MRN: [id number] Date of Birth: December 10, 1950 Referring Provider (SLP): [name], MD   Encounter Date: 01/07/2021   End of Session - 01/07/21 1046    Visit Number 7    Number of Visits 17    Date for SLP Re-Evaluation 03/13/21    Authorization Type humana    SLP Start Time 1100    SLP Stop Time  0938    SLP Time Calculation (min) 45 min    Activity Tolerance Patient limited by pain;Patient limited by fatigue           Past Medical History:  Diagnosis Date  . Cancer ([place])    Throat cancer 2019  . ETOH abuse   . Frequent urination   . Glaucoma   . Hepatitis C virus infection without hepatic coma    dx'ed in 11/2018  . History of radiation therapy 05/12/19- 07/06/19   Larynx  . Hypertension   . Wears denture    upper only; lost lower denture    Past Surgical History:  Procedure Laterality Date  . ANKLE SURGERY  2011   right ankle  . COLONOSCOPY  02/2019   polyps - Dr Havery Moros  . DIRECT LARYNGOSCOPY N/A 04/01/2019   Procedure: DIRECT LARYNGOSCOPY WITH BIOPSY;  Surgeon: Jodi Marble, MD;  Location: [facility];  Service: ENT;  Laterality: N/A;  . DIRECT LARYNGOSCOPY N/A 01/06/2020   Procedure: DIRECT LARYNGOSCOPY;  Surgeon: Izora Gala, MD;  Location: [facility];  Service: ENT;  Laterality: N/A;  . DIRECT LARYNGOSCOPY N/A 08/13/2020   Procedure: DIRECT LARYNGOSCOPY;  Surgeon: Izora Gala, MD;  Location: [facility];  Service: ENT;  Laterality: N/A;  . ESOPHAGOSCOPY N/A 04/01/2019   Procedure: ESOPHAGOSCOPY;  Surgeon: Jodi Marble, MD;  Location: [facility];  Service: ENT;  Laterality: N/A;  . EXCISION ORAL TUMOR Right 08/13/2020   Procedure: BIOPSY OF OROPHARYNGEAL MASS;  Surgeon: Izora Gala, MD;  Location: [facility];   Service: ENT;  Laterality: Right;  . EYE SURGERY Right   . IR CM INJ ANY COLONIC TUBE W/FLUORO  12/20/2020  . IR GASTROSTOMY TUBE MOD SED  09/24/2020  . IR IMAGING GUIDED PORT INSERTION  09/24/2020  . KNEE SURGERY    . LARYNGOSCOPY AND BRONCHOSCOPY N/A 04/01/2019   Procedure: BRONCHOSCOPY;  Surgeon: Jodi Marble, MD;  Location: [facility];  Service: ENT;  Laterality: N/A;  . RADICAL NECK DISSECTION N/A 01/06/2020   Procedure: RADICAL NECK DISSECTION;  Surgeon: Izora Gala, MD;  Location: [facility];  Service: ENT;  Laterality: N/A;    There were no vitals filed for this visit.   Subjective Assessment - 01/07/21 1104    Currently in Pain? Yes    Pain Score 7     Pain Location Throat    Pain Descriptors / Indicators Sore    Pain Type Acute pain    Pain Onset More than a month ago    Pain Frequency Constant    Pain Relieving Factors pain pill                 ADULT SLP TREATMENT - 01/07/21 1046      General Information   Behavior/Cognition Pleasant mood;Lethargic;Requires cueing;Distractible      Treatment Provided   Treatment provided Dysphagia      Dysphagia Treatment  Respiratory Status Room air    Oral Cavity - Dentition Edentulous    Treatment Methods Skilled observation;Patient/caregiver education;Therapeutic exercise    Patient observed directly with PO's Yes    Type of PO's observed Thin liquids    Liquids provided via Teaspoon   1/16 tsp   Oral Phase Signs & Symptoms Other (comment)   ?decreased bolus control and cohesion   Pharyngeal Phase Signs & Symptoms Multiple swallows;Wet vocal quality;Immediate throat clear;Delayed throat clear;Delayed cough    Type of cueing Verbal;Tactile    Other treatment/comments Pt arrived with spit cup in hand. Pt presents with congestion and frequent coughing this session, with pt reports sensation of phlegm and usual expectoration noted. SLP reviewed HEP, including pt recalled completion of effortful swallows and Mendelsohn manuvers. Pt  reports he completes ~20 reps per day. SLP trialed 1/16 tsp sips of thin liquids x5 trials, with decreased oral control with suspected premature spillage. Immediate throat clears and delayed coughing noted for 4/5 trials. Usual expectoration of spit noted during trials, sw exercises, and in conversation. Pt completed effortful swallows x10 and Mendelsohn x5 this session. SLP reviewed NPO recommendations, s/sx of aspiration PNA, and importance of thorough oral care, in which pt verbalized understanding and pt able to recall 3 s/sx with min prompting. Pt reports follow-up appointment with MD not yet scheduled. Pt c/o feeling nasuated "off and on" throughout day, with occasional vomiting.      Assessment / Recommendations / Plan   Plan Continue with current plan of care      Progression Toward Goals   Progression toward goals Progressing toward goals            SLP Education - 01/07/21 1119    Education Details NPO, s/sx of aspiration, effortful swallows, Mendelsohn manuever    Person(s) Educated Patient    Methods Explanation;Demonstration;Verbal cues    Comprehension Verbalized understanding;Returned demonstration;Verbal cues required            SLP Short Term Goals - 01/07/21 1045      SLP SHORT TERM GOAL #1   Title pt will demo correct procedure for HEP with rare min A, in 2 sessions    Baseline 01/02/21, 01-07-21    Time --    Period --   or 9 visits, for all STGs except STG #2   Status Achieved      SLP SHORT TERM GOAL #2   Title pt will tell SLP why he is completing HEP with rare min A    Baseline 12-26-20    Period --   or 5 total visits   Status Achieved      SLP SHORT TERM GOAL #3   Title pt will demo swallow precautions with occasional min A in two sessions    Status Deferred   pt now NPO     SLP SHORT TERM GOAL #4   Title pt will tell SLP 3 overt s/sx aspiration PNA in 2 sessions after SLP review of overt s/sx    Baseline 12-26-20, 01-07-21    Time --    Period --     Status Achieved      SLP SHORT TERM GOAL #5   Title pt will complete 10 effortful swallows in 60 seconds    Time 1    Period Weeks    Status On-going            SLP Long Term Goals - 01/07/21 1045      SLP LONG TERM GOAL #  1   Title pt will demo proper procedure for HEP with modified independence over 3 sessions    Time 5    Period Weeks   or 17 visits, for all LTGs   Status On-going      SLP LONG TERM GOAL #2   Title pt will perform 10 reps Mendelsohn in 75 seconds    Time 5    Period Weeks    Status On-going      SLP LONG TERM GOAL #3   Title pt will demo aspiration precauations with POs with occasional min A in 4 sessions total    Status Deferred   pt now NPO           Plan - 01/07/21 1143    Clinical Impression Statement [name] presents today with severe dysphagia after FEES at [facility] on 12-03-20. See that report for details. NPO was recommended with ice chips/water with SLP only after meticulous oral care. Pt reports he is abiding by NPO recommendation following extensive education last two ST sessions. SLP conducted PO trials of thin liquids via tsp, with usual s/sx of aspiration noted. SLP reviewed dysphagia HEP, with pt able to complete recommended swallow exercises with min A. Fatigue and N/V limited overall number of repetitions. See "other comments" for more details of today's session. SLP will need to continue to follow to track proper completion of HEP and to assess safety with POs (pt's following of aspiration precautions).    Speech Therapy Frequency 2x / week    Duration 8 weeks   or 17 total sessions   Treatment/Interventions Aspiration precaution training;Pharyngeal strengthening exercises;Trials of upgraded texture/liquids;Diet toleration management by SLP;Internal/external aids;Patient/family education;SLP instruction and feedback;Environmental controls;Compensatory strategies    Potential to Achieve Goals Fair    Potential Considerations Previous level of  function;Severity of impairments;Cooperation/participation level;Other (comment)    SLP Home Exercise Plan provided    Consulted and Agree with Plan of Care Patient           Patient will benefit from skilled therapeutic intervention in order to improve the following deficits and impairments:   Pharyngeal dysphagia    Problem List Patient Active Problem List   Diagnosis Date Noted  . Chemotherapy-induced diarrhea 10/30/2020  . Port-A-Cath in place 10/23/2020  . Cancer of tonsillar fossa ([place]) 08/31/2020  . Laryngeal cancer ([place]) 01/06/2020  . Metastatic cancer to cervical lymph nodes ([place]) 12/01/2019  . Chronic hepatitis C without hepatic coma ([place]) 06/09/2019  . 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 Squamous cell carcinoma of neck 04/01/2019    Alinda Deem, MA CCC-SLP 01/07/2021, 11:55 AM  [facility] [address] [place], [place], [address] Phone: [phone]   Fax:  [phone]   Name: [name] MRN: [id number] Date of Birth: 05-15-1951

## 2021-01-09 ENCOUNTER — Ambulatory Visit: Payer: Medicare HMO

## 2021-01-09 ENCOUNTER — Other Ambulatory Visit: Payer: Self-pay

## 2021-01-09 DIAGNOSIS — R1313 Dysphagia, pharyngeal phase: Secondary | ICD-10-CM | POA: Diagnosis not present

## 2021-01-09 NOTE — Therapy (Signed)
[facility] [address] [place], [place], [address] Phone: [phone]   Fax:  [phone]  Speech Language Pathology Treatment  Patient Details  Name: [name] MRN: [id number] Date of Birth: [birth year] Referring Provider (SLP): [name], MD   Encounter Date: 01/09/2021   End of Session - 01/09/21 1252    Visit Number 8    Number of Visits 17    Date for SLP Re-Evaluation 03/13/21    Authorization Type humana    SLP Start Time 1104    SLP Stop Time  9702    SLP Time Calculation (min) 41 min           Past Medical History:  Diagnosis Date  . Cancer ([place])    Throat cancer 2019  . ETOH abuse   . Frequent urination   . Glaucoma   . Hepatitis C virus infection without hepatic coma    dx'ed in 11/2018  . History of radiation therapy 05/12/19- 07/06/19   Larynx  . Hypertension   . Wears denture    upper only; lost lower denture    Past Surgical History:  Procedure Laterality Date  . ANKLE SURGERY  2011   right ankle  . COLONOSCOPY  02/2019   polyps - Dr Havery Moros  . DIRECT LARYNGOSCOPY N/A 04/01/2019   Procedure: DIRECT LARYNGOSCOPY WITH BIOPSY;  Surgeon: Jodi Marble, MD;  Location: [facility];  Service: ENT;  Laterality: N/A;  . DIRECT LARYNGOSCOPY N/A 01/06/2020   Procedure: DIRECT LARYNGOSCOPY;  Surgeon: Izora Gala, MD;  Location: [facility];  Service: ENT;  Laterality: N/A;  . DIRECT LARYNGOSCOPY N/A 08/13/2020   Procedure: DIRECT LARYNGOSCOPY;  Surgeon: Izora Gala, MD;  Location: [facility];  Service: ENT;  Laterality: N/A;  . ESOPHAGOSCOPY N/A 04/01/2019   Procedure: ESOPHAGOSCOPY;  Surgeon: Jodi Marble, MD;  Location: [facility];  Service: ENT;  Laterality: N/A;  . EXCISION ORAL TUMOR Right 08/13/2020   Procedure: BIOPSY OF OROPHARYNGEAL MASS;  Surgeon: Izora Gala, MD;  Location: [facility];  Service: ENT;  Laterality: Right;  . EYE SURGERY Right   . IR CM INJ  ANY COLONIC TUBE W/FLUORO  12/20/2020  . IR GASTROSTOMY TUBE MOD SED  09/24/2020  . IR IMAGING GUIDED PORT INSERTION  09/24/2020  . KNEE SURGERY    . LARYNGOSCOPY AND BRONCHOSCOPY N/A 04/01/2019   Procedure: BRONCHOSCOPY;  Surgeon: Jodi Marble, MD;  Location: [facility];  Service: ENT;  Laterality: N/A;  . RADICAL NECK DISSECTION N/A 01/06/2020   Procedure: RADICAL NECK DISSECTION;  Surgeon: Izora Gala, MD;  Location: [facility];  Service: ENT;  Laterality: N/A;    There were no vitals filed for this visit.   Subjective Assessment - 01/09/21 1112    Subjective "Everytime I wake up I'm sweating."                 ADULT SLP TREATMENT - 01/09/21 1113      General Information   Behavior/Cognition Pleasant mood;Lethargic;Requires cueing;Distractible      Treatment Provided   Treatment provided Dysphagia      Dysphagia Treatment   Temperature Spikes Noted --   pt unsure but states he wakes up sweating   Respiratory Status Room air    Oral Cavity - Dentition Edentulous    Treatment Methods Skilled observation;Therapeutic exercise;Patient/caregiver education;Compensation strategy training    Patient observed directly with PO's Yes    Type of PO's observed Thin liquids  Liquids provided via Teaspoon   1/8 - 1/16 teaspoon   Oral Phase Signs & Symptoms --   ? premature spillage   Pharyngeal Phase Signs & Symptoms Multiple swallows;Immediate cough;Watery eyes;Wet vocal quality    Other treatment/comments Pt indicated he performed meticulous oral care <30 minutes prior to session today. Less congestion than previous session this SLP saw pt (01-03-21). Pt reports coughing yellowish mucous from either chest or throat, pt not sure. Pt reports getting 20/day reps of Mendelsohn, and about 15/day of effortful swallow. SLP told pt to attempt to double that up to 40 reps/day as a goal at this time. Pt with question of when he will eat - SLP told [name] that his swallow needs to get as strong as it can  before he attempts another swallow test - and that swallow test will tell us if he can eat or drink safely and if so - what he can eat and/or drink safely. After 7 reps of effortful swallow pt with delay in swallow trigger of 6-7 seconds. With [place] pt with loud sigh after each rep after the second rep, indicating fatigue. Extent of thyroid elevation was reduced after 3 reps and elevation time reduced after 3 reps. Pt req'd to take a break after 3 reps - after these reps pt demonstrated swallow hold time which was decreasing in seconds from rep to rep, indicating fatigue. With PO water pt had violent cough on first1/6-1/8 teaspoon and did not try any more after this.      Pain Assessment   Pain Assessment 0-10    Pain Score 5     Pain Location throat and radiating to rt ear when coughing or swallowing    Pain Descriptors / Indicators Sharp;Sore    Pain Intervention(s) Monitored during session            SLP Education - 01/09/21 1251    Education Details rationale for oral care prior to POs here in clinic, signs that his swallowing is stronger and he is ready for another swallow eval    Person(s) Educated Patient    Methods Explanation    Comprehension Verbalized understanding            SLP Short Term Goals - 01/07/21 [place] #1   Title pt will demo correct procedure for HEP with rare min A, in 2 sessions    Baseline 01/02/21, 01-07-21    Time --    Period --   or 9 visits, for all STGs except STG #2   Status Achieved      SLP SHORT TERM GOAL #2   Title pt will tell SLP why he is completing HEP with rare min A    Baseline 12-26-20    Period --   or 5 total visits   Status Achieved      SLP SHORT TERM GOAL #3   Title pt will demo swallow precautions with occasional min A in two sessions    Status Deferred   pt now NPO     SLP SHORT TERM GOAL #4   Title pt will tell SLP 3 overt s/sx aspiration PNA in 2 sessions after SLP review of overt s/sx     Baseline 12-26-20, 01-07-21    Time --    Period --    Status Achieved      SLP SHORT TERM GOAL #5   Title pt will complete 10 effortful swallows in 60 seconds  Time 1    Period Weeks    Status On-going            SLP Long Term Goals - 01/09/21 1255      SLP LONG TERM GOAL #1   Title pt will demo proper procedure for HEP with modified independence over 3 sessions    Baseline 01-09-21    Time 5    Period Weeks   or 17 visits, for all LTGs   Status On-going      SLP LONG TERM GOAL #2   Title pt will perform 10 reps Mendelsohn in 75 seconds    Time 5    Period Weeks    Status On-going      SLP LONG TERM GOAL #3   Title pt will demo aspiration precauations with POs with occasional min A in 4 sessions total    Status Deferred   pt now NPO           Plan - 01/09/21 1253    Clinical Impression Statement [name] presents today with severe dysphagia after FEES at [facility] on 12-03-20.See that report for details. NPO was recommended with ice chips/water with SLP after meticulous oral care. Pt completing HEP suboptimal scope (less than suggested # of reps/day). Intensive swallow therapy was recommended after FEES. See "other comments" for more details of today's session. SLP will need to continue to follow to track proper completion of HEP and to assess safety with POs (pt's following of aspiration precautions).    Speech Therapy Frequency 2x / week   once approx every 4 weeks   Duration 8 weeks   or 17 total sessions   Treatment/Interventions Aspiration precaution training;Pharyngeal strengthening exercises;Trials of upgraded texture/liquids;Diet toleration management by SLP;Internal/external aids;Patient/family education;SLP instruction and feedback;Environmental controls;Compensatory strategies    Potential to Achieve Goals Fair    Potential Considerations Previous level of function;Severity of impairments;Cooperation/participation level;Other (comment)   limited literacy   SLP Home  Exercise Plan reviewed - Mendelsohn and Effortful swallow today    Consulted and Agree with Plan of Care Patient           Patient will benefit from skilled therapeutic intervention in order to improve the following deficits and impairments:   Pharyngeal dysphagia    Problem List Patient Active Problem List   Diagnosis Date Noted  . Chemotherapy-induced diarrhea 10/30/2020  . Port-A-Cath in place 10/23/2020  . Cancer of tonsillar fossa ([place]) 08/31/2020  . Laryngeal cancer ([place]) 01/06/2020  . Metastatic cancer to cervical lymph nodes ([place]) 12/01/2019  . Chronic hepatitis C without hepatic coma ([place]) 06/09/2019  . 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 Squamous cell carcinoma of neck 04/01/2019    [facility] ,MS, CCC-SLP  01/09/2021, 12:55 PM  [facility] [address] [place], [place], [address] Phone: [phone]   Fax:  [phone]   Name: [name] MRN: [id number] Date of Birth: 1951/01/19

## 2021-01-10 ENCOUNTER — Inpatient Hospital Stay: Payer: Medicare HMO

## 2021-01-10 ENCOUNTER — Inpatient Hospital Stay (HOSPITAL_BASED_OUTPATIENT_CLINIC_OR_DEPARTMENT_OTHER): Payer: Medicare HMO | Admitting: Hematology and Oncology

## 2021-01-10 ENCOUNTER — Telehealth: Payer: Self-pay | Admitting: Hematology and Oncology

## 2021-01-10 ENCOUNTER — Ambulatory Visit (HOSPITAL_COMMUNITY)
Admission: RE | Admit: 2021-01-10 | Discharge: 2021-01-10 | Disposition: A | Discharge status: Home / Self Care | Payer: Medicare HMO | Source: Ambulatory Visit | Attending: Hematology and Oncology | Admitting: Hematology and Oncology

## 2021-01-10 ENCOUNTER — Encounter: Payer: Self-pay | Admitting: Hematology and Oncology

## 2021-01-10 VITALS — BP 121/89 | HR 78 | Temp 97.4°F | Resp 15 | Ht 68.0 in | Wt 122.3 lb

## 2021-01-10 DIAGNOSIS — C090 Malignant neoplasm of tonsillar fossa: Secondary | ICD-10-CM | POA: Insufficient documentation

## 2021-01-10 DIAGNOSIS — R0602 Shortness of breath: Secondary | ICD-10-CM | POA: Insufficient documentation

## 2021-01-10 DIAGNOSIS — Z95828 Presence of other vascular implants and grafts: Secondary | ICD-10-CM

## 2021-01-10 DIAGNOSIS — K1231 Oral mucositis (ulcerative) due to antineoplastic therapy: Secondary | ICD-10-CM | POA: Diagnosis not present

## 2021-01-10 DIAGNOSIS — C329 Malignant neoplasm of larynx, unspecified: Secondary | ICD-10-CM | POA: Diagnosis not present

## 2021-01-10 DIAGNOSIS — Z931 Gastrostomy status: Secondary | ICD-10-CM

## 2021-01-10 LAB — CBC WITH DIFFERENTIAL/PLATELET
Abs Immature Granulocytes: 0.02 10*3/uL (ref 0.00–0.07)
Basophils Absolute: 0 10*3/uL (ref 0.0–0.1)
Basophils Relative: 1 %
Eosinophils Absolute: 0.1 10*3/uL (ref 0.0–0.5)
Eosinophils Relative: 5 %
HCT: 33.8 % — ABNORMAL LOW (ref 39.0–52.0)
Hemoglobin: 11.2 g/dL — ABNORMAL LOW (ref 13.0–17.0)
Immature Granulocytes: 1 %
Lymphocytes Relative: 38 %
Lymphs Abs: 1.2 10*3/uL (ref 0.7–4.0)
MCH: 31.9 pg (ref 26.0–34.0)
MCHC: 33.1 g/dL (ref 30.0–36.0)
MCV: 96.3 fL (ref 80.0–100.0)
Monocytes Absolute: 0.3 10*3/uL (ref 0.1–1.0)
Monocytes Relative: 9 %
Neutro Abs: 1.4 10*3/uL — ABNORMAL LOW (ref 1.7–7.7)
Neutrophils Relative %: 46 %
Platelets: 199 10*3/uL (ref 150–400)
RBC: 3.51 MIL/uL — ABNORMAL LOW (ref 4.22–5.81)
RDW: 13.2 % (ref 11.5–15.5)
WBC: 3.1 10*3/uL — ABNORMAL LOW (ref 4.0–10.5)
nRBC: 0 % (ref 0.0–0.2)

## 2021-01-10 LAB — CMP (CANCER CENTER ONLY)
ALT: 6 U/L (ref 0–44)
AST: 15 U/L (ref 15–41)
Albumin: 4.1 g/dL (ref 3.5–5.0)
Alkaline Phosphatase: 60 U/L (ref 38–126)
Anion gap: 10 (ref 5–15)
BUN: 8 mg/dL (ref 8–23)
CO2: 29 mmol/L (ref 22–32)
Calcium: 9.6 mg/dL (ref 8.9–10.3)
Chloride: 101 mmol/L (ref 98–111)
Creatinine: 0.67 mg/dL (ref 0.61–1.24)
GFR, Estimated: >60 mL/min (ref >60)
Glucose, Bld: 97 mg/dL (ref 70–99)
Potassium: 4.3 mmol/L (ref 3.5–5.1)
Sodium: 140 mmol/L (ref 135–145)
Total Bilirubin: 0.4 mg/dL (ref 0.3–1.2)
Total Protein: 8.1 g/dL (ref 6.5–8.1)

## 2021-01-10 LAB — MAGNESIUM: Magnesium: 1.8 mg/dL (ref 1.7–2.4)

## 2021-01-10 MED ORDER — HYDROCODONE-ACETAMINOPHEN 7.5-325 MG/15ML PO SOLN: 10.0000 mL | Freq: Four times a day (QID) | ORAL | 0 refills | Status: DC | PRN

## 2021-01-10 MED ORDER — FLUCONAZOLE 200 MG PO TABS: 200.0000 mg | ORAL_TABLET | Freq: Every day | ORAL | 0 refills | Status: DC

## 2021-01-10 NOTE — Telephone Encounter (Signed)
Scheduled follow-up appointment per 4/28 los. Patient is aware. 

## 2021-01-10 NOTE — Progress Notes (Signed)
[facility] NOTE  Patient Care Team: [name], MD as PCP - General (Internal Medicine) Izora Gala, MD as Consulting Physician (Otolaryngology) [name], MD as Attending Physician (Radiation Oncology) Leota Sauers, RN (Inactive) as Oncology Nurse Navigator Karie Mainland, RD as Dietitian (Nutrition) Valentino Saxon Perry Mount, CCC-SLP as Speech Language Pathologist (Speech Pathology) Kennith Center, LCSW as Social Worker [name], MD as Consulting Physician (Hematology and Oncology) Malmfelt, Stephani Police, RN as Oncology Nurse Navigator  CHIEF COMPLAINTS/PURPOSE OF CONSULTATION:  Recurrent SCC of larynx  ASSESSMENT & PLAN:   Cancer of tonsillar fossa [facility]) This is a very pleasant 70 year old male patient with history of recurrent laryngeal cancer with a right oropharyngeal mass referred to medical oncology for further recommendations.   He completed chemoradiation concurrently in Feb 2022. No clinical evidence of residual disease He is here for FU.  He continues to have lot of pain in his mouth and throat,  Will prescribe him Hycet and he should FU with his ENT again It is unusual to have these symptoms almost 2 months out from his radiation. I also gave him prescription for fluconazole given concern for oropharyngeal candidiasis. Repeat PET imaging ordered to assess the status of the malignancy.  Port-A-Cath in place Will await PET results before removing the port. No concern for infection  Shortness of breath SOB worsening according to the patient with some cough and sweating Will get a CXR today Clinically no evidence of crackles. If concern for pneumonia, since he is clinically stable, will prescribe course of abx for outpatient administration    Mucositis due to antineoplastic therapy He complains of severe pain in his mouth and his throat, about 7/10 Cant even drink water. Will give another prescription for hycet,  Will try  fluconazole for thrush. RTC in 2 weeks FU with ENT recommended as well since it is unusual to have these worsening symptoms almost 2.5 months after chemotherapy and radiation.  G tube feedings ([place]) Gained about 3 lbs since last visit Continue 6 cans of osmolite daily. Diarrhea likely from ensure, mild. FU with nutrition  Orders Placed This Encounter  Procedures  . DG Chest 2 View    Standing Status:   Future    Standing Expiration Date:   01/10/2022    Order Specific Question:   Reason for exam:    Answer:   Worsening SOB, risk for aspiration,    Order Specific Question:   Preferred imaging location?    Answer:   [facility]  . CBC with Differential/Platelet    Standing Status:   Standing    Number of Occurrences:   22    Standing Expiration Date:   01/10/2022  . Basic Metabolic Panel - [place] Only    Standing Status:   Future    Standing Expiration Date:   01/10/2022    HISTORY OF PRESENTING ILLNESS:   Oncology History Overview Note    Khaleed Sipe Etchells is a 70 y.o. male who has been treated for laryngeal cancer in the past.   Aug 2020, Pt had presented with dysphagia and difficulty breathing through his mouth, 20 lb weight loss. He saw Dr. Erik Obey who performed laryngoscopy and appreciated a 2 to 3 cm ovoid pedunculated mass arising from the right aryepiglottic fold and impinging on the supraglottic airway.  The vocal cords appeared to be mobile.  Urgent panendoscopy and biopsy were recommended to protect his airway.   On 04/01/2019 biopsy/debulking revealed squamous cell  carcinoma with focal sarcomatoid features, poorly differentiated, P 16 neg. According to his op note, a bulky necrotic and semi-pedunculated tumor coming off the lateral surface of the right aryepiglottic fold and the anterior and lateral aspect of the piriform sinus on the right side.  Airway was compromised by the tumor mass at intubation.   CT scan of the neck on 04/11/2019 showed glottic closure  with no asymmetry of the cords, correlate with laryngoscopy results.  Questionable right level 2 lymph node measured 7 mm.  No definite pathologic lymph nodes in the neck.   PET scan on 04/22/2019 revealed mild residual activity in the posterior right hypopharynx confined to the mucosa with no evidence of metastatic disease to the neck or distantly.  He underwent definitive radiotherapy to T2N0 laryngeal cancer  since he refused surgical treatment.  He then had PET imaging which showed a right neck lymph node, suspicious on PET. Biopsy of right neck lymph node is positive for recurrence. He underwent right modified radical neck dissection including levels 2 and 3, including the internal jugular vein, suture marks the inferior jugular vein stump.  He most recently underwent a soft tissue neck CT scan on the date of 07/05/2020 that revealed asymmetric soft tissue thickening and enhancement along the right lateral pharynx. It also showed suspicious small 6 mm hyperenhancing nodular soft tissue along the posterior margin of the neck dissect at right level 3. Additionally, there was a mild inflammatory appearing right upper lobe centrilobular ground-glass opacity that was new (consider mild or developing upper lobe infection), post radiation changes to the lung apices, and sequelae of radiation and right neck dissection from previous neck CT.   Subsequently, the patient underwent a direct laryngoscopy and biopsy of oropharyngeal mass on the date of 08/13/2020. Pathology from the procedure revealed poorly differentiated squamous cell carcinoma.  PET showed recurrence of head and neck cancer with broad hypermetabolic pharyngeal mucosal lesion involving right lateral posterior oropharynx and hypopharynx extending from palatine tonsil to the vallecula.Hypermetabolic lymph node posterior sternocleidomastoid muscle on the RIGHT (level 3).Hypermetabolic nodule within the LEFT parotid glands favored small primary  parotid neoplasm.  Recommendation was to consider proceeding with upfront concurrent chemo radiation followed by salvage surgery if needed. He completed 6 weekly cycles of chemotherapy and concurrent radiation, 10/2020.   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 SEE NOTE   04/11/2019 Imaging   CT neck: IMPRESSION: The glottis is closed with suboptimal evaluation of the vocal cords. No asymmetry of the cords. Correlate with laryngoscopy results.   No enlarged lymph nodes in the neck. 7 mm right level 2 lymph node may be reactive. No definite pathologic lymph nodes in the neck.   04/22/2019 Imaging   PET: IMPRESSION: 1. Mild residual activity in the posterior RIGHT hypopharynx confined to the mucosa. 2. No evidence of hypermetabolic metastatic lymph nodes in LEFT or RIGHT neck. 3. No evidence distant metastatic disease.   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 He is not doing well. He complains of severe pain in his mouth, cant even drink water. Nausea, mild takes PRN nausea meds, mostly once in a day Mild diarrhea, he attributes this to  ensure, about 2/3 times a day. Worsening cough, SOB, wheezing and occasional sweating, he is worried could be a pneumonia He is using tube feeds, 6 cans a day Rest of the pertinent 10 point ROS reviewed and negative.  MEDICAL HISTORY:  Past Medical History:  Diagnosis Date  . Cancer ([place])    Throat cancer 2019  . ETOH abuse   . Frequent urination   . Glaucoma   . Hepatitis C virus infection without hepatic coma    dx'ed in 11/2018  . History of radiation therapy 05/12/19- 07/06/19   Larynx  . Hypertension   . Wears denture    upper only; lost lower denture    SURGICAL HISTORY: Past Surgical History:  Procedure Laterality Date  . ANKLE SURGERY  2011   right ankle  . COLONOSCOPY  02/2019   polyps - Dr Havery Moros  . DIRECT LARYNGOSCOPY N/A 04/01/2019   Procedure: DIRECT LARYNGOSCOPY WITH BIOPSY;  Surgeon: Jodi Marble, MD;  Location: [place];  Service: ENT;  Laterality: N/A;  . DIRECT LARYNGOSCOPY N/A 01/06/2020   Procedure: DIRECT LARYNGOSCOPY;  Surgeon: Izora Gala, MD;  Location: [place];  Service: ENT;  Laterality: N/A;  . DIRECT LARYNGOSCOPY N/A 08/13/2020   Procedure: DIRECT LARYNGOSCOPY;  Surgeon: Izora Gala, MD;  Location: [place];  Service: ENT;  Laterality: N/A;  . ESOPHAGOSCOPY N/A 04/01/2019   Procedure: ESOPHAGOSCOPY;  Surgeon: Jodi Marble, MD;  Location: [place];  Service: ENT;  Laterality: N/A;  . EXCISION ORAL TUMOR Right 08/13/2020   Procedure: BIOPSY OF OROPHARYNGEAL MASS;  Surgeon: Izora Gala, MD;  Location: [place];  Service: ENT;  Laterality: Right;  . EYE SURGERY Right   . IR CM INJ ANY COLONIC TUBE W/FLUORO  12/20/2020  . IR GASTROSTOMY TUBE MOD SED  09/24/2020  . IR IMAGING GUIDED PORT INSERTION  09/24/2020  . KNEE SURGERY    . LARYNGOSCOPY AND BRONCHOSCOPY N/A 04/01/2019   Procedure: BRONCHOSCOPY;  Surgeon: Jodi Marble, MD;  Location: [place];  Service: ENT;  Laterality: N/A;  . RADICAL NECK DISSECTION N/A 01/06/2020    Procedure: RADICAL NECK DISSECTION;  Surgeon: Izora Gala, MD;  Location: [facility] OR;  Service: ENT;  Laterality: N/A;    SOCIAL HISTORY: Social History   Socioeconomic History  . Marital status: Single    Spouse name: Not on file  . Number of children: 2  . Years of education: Not on file  . Highest education level: Not on file  Occupational History  . Not on file  Tobacco Use  . Smoking status: Former Smoker    Years: 50.00    Types: Cigarettes    Quit date: 06/01/2020    Years since quitting: 0.6  . Smokeless tobacco: Never Used  Vaping Use  . Vaping Use: Never used  Substance and Sexual Activity  . Alcohol use: Yes    Alcohol/week: 4.0 standard drinks    Types: 4 Cans of beer per week  Comment: 40oz beer daily  . Drug use: Yes    Types: Cocaine    Comment: none in 2 yrs  . Sexual activity: Yes    Partners: Female  Other Topics Concern  . Not on file  Social History Narrative   Patient is divorced with 2 children.   Patient is currently living with his sister.   Patient with a history of smoking a third of pack of cigarettes daily for 50 years.  Patient currently smoking 2 to 3 cigarettes/day.   Patient has never used smokeless tobacco.   Patient with occasional use of alcohol.   Patient last used cocaine approximately 6 months ago.  Patient denies use of marijuana.   Social Determinants of Health   Financial Resource Strain: Not on file  Food Insecurity: No Food Insecurity  . Worried About Charity fundraiser in the Last Year: Never true  . Ran Out of Food in the Last Year: Never true  Transportation Needs: No Transportation Needs  . Lack of Transportation (Medical): No  . Lack of Transportation (Non-Medical): No  Physical Activity: Not on file  Stress: Not on file  Social Connections: Unknown  . Frequency of Communication with Friends and Family: Three times a week  . Frequency of Social Gatherings with Friends and Family: More than three times a week  .  Attends Religious Services: Not on file  . Active Member of Clubs or Organizations: Not on file  . Attends Archivist Meetings: Not on file  . Marital Status: Not on file  Intimate Partner Violence: Not on file    FAMILY HISTORY: Family History  Problem Relation Age of Onset  . Breast cancer Sister   . Colon cancer Brother 57       ????  . Cancer Brother     ALLERGIES:  has No Known Allergies.  MEDICATIONS:  Current Outpatient Medications  Medication Sig Dispense Refill  . fluconazole (DIFLUCAN) 200 MG tablet Take 1 tablet (200 mg total) by mouth daily. 1 tablet 0  . atropine 1 % ophthalmic solution Place 1 drop into the right eye 2 (two) times a day.     . cetirizine (ZYRTEC) 10 MG tablet Take 10 mg by mouth daily as needed for allergies.    Marland Kitchen dexamethasone (DECADRON) 4 MG tablet Take 2 tablets (8 mg total) by mouth daily. Take daily x 3 days starting the day after cisplatin chemotherapy. Take with food. 30 tablet 1  . docusate sodium (COLACE) 50 MG capsule Take 1 capsule (50 mg total) by mouth 2 (two) times daily. 10 capsule 0  . HYDROcodone-acetaminophen (HYCET) 7.5-325 mg/15 ml solution Take 10 mLs by mouth 4 (four) times daily as needed for moderate pain (Cancer pain). 473 mL 0  . ibuprofen (ADVIL) 200 MG tablet Take 400 mg by mouth every 6 (six) hours as needed for headache or moderate pain.    Marland Kitchen latanoprost (XALATAN) 0.005 % ophthalmic solution Place 1 drop into the right eye at bedtime.     . lidocaine (XYLOCAINE) 2 % solution Patient: Mix 1part 2% viscous lidocaine, 1part H20. Swish AND swallow 41mL of diluted mixture, 64min before meals and at bedtime, up to QID 200 mL 3  . lidocaine-prilocaine (EMLA) cream Apply to affected area once (Patient taking differently: Apply 1 application topically daily as needed (port access). Apply to affected area once) 30 g 3  . LORazepam (ATIVAN) 0.5 MG tablet Take 1 tablet (0.5 mg total) by mouth every 6 (  six) hours as needed  (Nausea or vomiting). 30 tablet 0  . losartan (COZAAR) 50 MG tablet Take 50 mg by mouth daily.    . Nutritional Supplements (FEEDING SUPPLEMENT, OSMOLITE 1.5 CAL,) LIQD Give 1 carton Osmolite 1.5 via PEG 4 times daily with 90 mL free water before and after bolus feeding.  Increase to 1-1/2 cartons 4 times daily.  In addition, drink or flush tube with an additional 240 mL free water twice daily.  This provides 2130 cal, 89.4 g protein and 2352 mL free water which is 100% estimated needs. 1422 mL 0  . ondansetron (ZOFRAN) 8 MG tablet Take 1 tablet (8 mg total) by mouth 2 (two) times daily as needed. Start on the third day after cisplatin chemotherapy. 30 tablet 1  . prednisoLONE acetate (PRED FORTE) 1 % ophthalmic suspension Place 1 drop into both eyes daily.    . prochlorperazine (COMPAZINE) 10 MG tablet Take 1 tablet (10 mg total) by mouth every 6 (six) hours as needed (Nausea or vomiting). 30 tablet 1  . tolterodine (DETROL LA) 4 MG 24 hr capsule Take 4 mg by mouth daily.    . Vitamin D, Ergocalciferol, (DRISDOL) 1.25 MG (50000 UNIT) CAPS capsule Take 50,000 Units by mouth once a week.     No current facility-administered medications for this visit.     PHYSICAL EXAMINATION: ECOG PERFORMANCE STATUS: 1 - Symptomatic but completely ambulatory  Vitals:   01/10/21 0840  BP: 121/89  Pulse: 78  Resp: 15  Temp: (!) 97.4 F (36.3 C)  SpO2: 100%   Filed Weights   01/10/21 0840  Weight: 122 lb 4.8 oz (55.5 kg)    GENERAL:alert, no distress and comfortable. SKIN: skin color, texture, turgor are normal, no rashes or significant lesions EYES: normal, conjunctiva are pink and non-injected, sclera clear NECK: No palpable LN, LUNGS: No adv sounds. CTA B/L HEART: regular rate & rhythm and no murmurs and no lower extremity edema ABDOMEN:abdomen soft, non-tender and normal bowel sounds. Peg site looks well. Musculoskeletal:no cyanosis of digits and no clubbing  PSYCH: alert & oriented x 3 with  fluent speech NEURO: no focal motor/sensory deficits  LABORATORY DATA:  I have reviewed the data as listed Lab Results  Component Value Date   WBC 3.1 (L) 01/10/2021   HGB 11.2 (L) 01/10/2021   HCT 33.8 (L) 01/10/2021   MCV 96.3 01/10/2021   PLT 199 01/10/2021     Chemistry      Component Value Date/Time   NA 131 (L) 10/30/2020 0909   K 4.1 10/30/2020 0909   CL 97 (L) 10/30/2020 0909   CO2 26 10/30/2020 0909   BUN 13 10/30/2020 0909   CREATININE 0.73 10/30/2020 0909   CREATININE 0.61 (L) 10/26/2019 1523      Component Value Date/Time   CALCIUM 8.6 (L) 10/30/2020 0909   ALKPHOS 41 01/03/2020 1355   AST 17 01/03/2020 1355   AST 60 (H) 05/04/2019 1207   ALT 14 01/03/2020 1355   ALT 53 (H) 09/06/2019 1543   BILITOT 0.5 01/03/2020 1355   BILITOT 0.5 05/04/2019 1207     Labs today, pending  RADIOGRAPHIC STUDIES: I have personally reviewed the radiological images as listed and agreed with the findings in the report. IR Cm Inj Any Colonic Tube W/Fluoro  Result Date: 12/20/2020 INDICATION: History of pull-through gastrostomy tube placement 09/24/2020 now with dysfunction of the gastrostomy tube adapter device as well as complaint of gastrostomy tube leakage. Patient presents today for fluoroscopic  guided gastrostomy tube evaluation. EXAM: FLUOROSCOPIC GUIDED REPLACEMENT OF GASTROSTOMY TUBE COMPARISON:  Image guided gastrostomy tube placement-09/24/2020 MEDICATIONS: None. CONTRAST:  20 cc-administered into the gastric lumen FLUOROSCOPY TIME:  18 seconds COMPLICATIONS: None immediate. PROCEDURE: A timeout was performed prior to the initiation of the procedure. Patient was placed supine on the fluoroscopy table. The fractured gastrostomy tube adaptor device was removed and a new adapter was applied. The external disc of the gastrostomy tube was cinched against the patient's skin and contrast injection was performed demonstrating appropriate position functionality of the gastrostomy tube.  Multiple spot fluoroscopic images were obtained confirming appropriate positioning and functionality of the new gastrostomy tube. A dressing was applied. The patient tolerated the procedure well without immediate postprocedural complication. IMPRESSION: 1. Successful bedside exchange of gastrostomy tube adapter. 2. Complain of gastrostomy tube leakage is likely secondary to incomplete opposition the retention disc which was secured against the ventral aspect the abdomen. Contrast injection demonstrates otherwise appropriate position functionality of the gastrostomy tube. The gastrostomy tube is ready for immediate use. Electronically Signed   By: Sandi Mariscal M.D.   On: 12/20/2020 15:17    All questions were answered. The patient knows to call the clinic with any problems, questions or concerns.  I spent 40 minutes in the care of this patient including H and P< review of records, counseling and coordination of care.    [name], MD 01/10/2021 9:49 AM

## 2021-01-10 NOTE — Assessment & Plan Note (Signed)
Gained about 3 lbs since last visit Continue 6 cans of osmolite daily. Diarrhea likely from ensure, mild. FU with nutrition

## 2021-01-10 NOTE — Assessment & Plan Note (Addendum)
SOB worsening according to the patient with some cough and sweating Will get a CXR today Clinically no evidence of crackles. If concern for pneumonia, since he is clinically stable, will prescribe course of abx for outpatient administration

## 2021-01-10 NOTE — Assessment & Plan Note (Signed)
He complains of severe pain in his mouth and his throat, about 7/10 Cant even drink water. Will give another prescription for hycet,  Will try fluconazole for thrush. RTC in 2 weeks FU with ENT recommended as well since it is unusual to have these worsening symptoms almost 2.5 months after chemotherapy and radiation.

## 2021-01-10 NOTE — Assessment & Plan Note (Signed)
This is a very pleasant 70 year old male patient with history of recurrent laryngeal cancer with a right oropharyngeal mass referred to medical oncology for further recommendations.   He completed chemoradiation concurrently in Feb 2022. No clinical evidence of residual disease He is here for FU.  He continues to have lot of pain in his mouth and throat,  Will prescribe him Hycet and he should FU with his ENT again It is unusual to have these symptoms almost 2 months out from his radiation. I also gave him prescription for fluconazole given concern for oropharyngeal candidiasis. Repeat PET imaging ordered to assess the status of the malignancy.

## 2021-01-10 NOTE — Assessment & Plan Note (Signed)
Will await PET results before removing the port. No concern for infection

## 2021-01-14 ENCOUNTER — Ambulatory Visit: Payer: Medicare HMO

## 2021-01-14 ENCOUNTER — Telehealth (HOSPITAL_COMMUNITY): Payer: Self-pay | Admitting: Dietician

## 2021-01-14 NOTE — Telephone Encounter (Addendum)
Nutrition Follow-up:  Patient is s/p treatment for recurrent laryngeal cancer.  Spoke with patient and caregiver Verdene Lennert) via telephone. Patient reports tolerating 2 cartons of Osmolite 1.5 via tube 3 times daily. He is using 16 oz bottle of water to flush with each bolus. Patient reports drinking 2-3 Ensure Plus by mouth. Patient reports having diarrhea "once or twice last week" This has resolved, reports having a regular bowel movement yesterday. Patient is complaining of mouth pain, reports his tongue is a little swollen. Caregiver reports Diflucan script picked up on Friday contained a single tablet. She contacted Dr. Rob Hickman office on Friday afternoon and is awaiting a return call.   Addendum: Pt is working with SLP 2x weekly and currenlty NPO due to severe dysphagia after 3/21 FEES at [facility]. Per 4/27 SLP note, diet toleration/mangement by SLP. Contacted patient to clarify route of supplement intake. Patient reports understanding of NPO and will not consume supplement by mouth.    Medications: Diflucan, Hydrocodone  Labs: reviewed  Anthropometrics: Weight 122 lb 4.8 oz increased ~4 lbs from 118 lb 9.6 oz on 4/12  3/8 - 130 lb 8 oz 2/8 - 131 lb  Estimated Energy Needs  Kcals: 2000-2200 Protein: 80-95 Fluid: 2.2 L  NUTRITION DIAGNOSIS: Unintended weight loss improved   INTERVENTION:  Diet advancement per SLP Continue Osmolite 1.5 - 2 cartons TID with 120 ml free water flush before and after each bolus feeding Diarrhea resolved, do not feel diarrhea related to intake of supplement Suggested drinking half of supplement at a time if having poor tolerance to Ensure Caregiver to contact Dr. Rob Hickman regarding Diflucan prescription     MONITORING, EVALUATION, GOAL: weight trends, tube feeding, oral intake   NEXT VISIT: Tuesday May 17

## 2021-01-18 ENCOUNTER — Ambulatory Visit: Payer: Medicare HMO | Admitting: Hematology and Oncology

## 2021-01-21 ENCOUNTER — Telehealth: Payer: Self-pay | Admitting: *Deleted

## 2021-01-21 NOTE — Telephone Encounter (Signed)
CALLED PATIENT'S SISTER- DAISY MICKEL TO INFORM OF PET SCAN ON 01-31-21 - ARRIVAL TIME- 12:30 PM @ [facility], PATIENT TO BE NPO- 6 HRS. PRIOR TO TEST AND PATIENT TO HAVE LAB ON 02-01-21 @ 2:15 PM @ [place] ON 02-01-21 @ 2:40 PM, LVM FOR A RETURN CALL

## 2021-01-23 ENCOUNTER — Telehealth: Payer: Self-pay | Admitting: *Deleted

## 2021-01-23 ENCOUNTER — Other Ambulatory Visit: Payer: Self-pay

## 2021-01-23 ENCOUNTER — Ambulatory Visit: Payer: Medicare HMO | Attending: Radiation Oncology

## 2021-01-23 DIAGNOSIS — R1313 Dysphagia, pharyngeal phase: Secondary | ICD-10-CM | POA: Diagnosis present

## 2021-01-23 NOTE — Telephone Encounter (Signed)
Returned patient's sister's phone call [name]), lvm for a return call

## 2021-01-23 NOTE — Telephone Encounter (Signed)
RETURNED [place] PHONE CALL, UNABLE TO LEAVE MESSAGE

## 2021-01-23 NOTE — Therapy (Signed)
[facility] [address] [place], [place], [address] Phone: [phone]   Fax:  [phone]  Speech Language Pathology Treatment  Patient Details  Name: [name] MRN: [id number] Date of Birth: May 05, 1951 Referring Provider (SLP): [name], MD   Encounter Date: 01/23/2021   End of Session - 01/23/21 1318    Visit Number 9    Number of Visits 17    Date for SLP Re-Evaluation 03/13/21    Authorization Type humana    SLP Start Time 1148    SLP Stop Time  1227    SLP Time Calculation (min) 39 min    Activity Tolerance Patient tolerated treatment well           Past Medical History:  Diagnosis Date  . Cancer ([place])    Throat cancer 2019  . ETOH abuse   . Frequent urination   . Glaucoma   . Hepatitis C virus infection without hepatic coma    dx'ed in 11/2018  . History of radiation therapy 05/12/19- 07/06/19   Larynx  . Hypertension   . Wears denture    upper only; lost lower denture    Past Surgical History:  Procedure Laterality Date  . ANKLE SURGERY  2011   right ankle  . COLONOSCOPY  02/2019   polyps - Dr Havery Moros  . DIRECT LARYNGOSCOPY N/A 04/01/2019   Procedure: DIRECT LARYNGOSCOPY WITH BIOPSY;  Surgeon: Jodi Marble, MD;  Location: [facility];  Service: ENT;  Laterality: N/A;  . DIRECT LARYNGOSCOPY N/A 01/06/2020   Procedure: DIRECT LARYNGOSCOPY;  Surgeon: Izora Gala, MD;  Location: [facility];  Service: ENT;  Laterality: N/A;  . DIRECT LARYNGOSCOPY N/A 08/13/2020   Procedure: DIRECT LARYNGOSCOPY;  Surgeon: Izora Gala, MD;  Location: [facility];  Service: ENT;  Laterality: N/A;  . ESOPHAGOSCOPY N/A 04/01/2019   Procedure: ESOPHAGOSCOPY;  Surgeon: Jodi Marble, MD;  Location: [facility];  Service: ENT;  Laterality: N/A;  . EXCISION ORAL TUMOR Right 08/13/2020   Procedure: BIOPSY OF OROPHARYNGEAL MASS;  Surgeon: Izora Gala, MD;  Location: [facility];  Service: ENT;   Laterality: Right;  . EYE SURGERY Right   . IR CM INJ ANY COLONIC TUBE W/FLUORO  12/20/2020  . IR GASTROSTOMY TUBE MOD SED  09/24/2020  . IR IMAGING GUIDED PORT INSERTION  09/24/2020  . KNEE SURGERY    . LARYNGOSCOPY AND BRONCHOSCOPY N/A 04/01/2019   Procedure: BRONCHOSCOPY;  Surgeon: Jodi Marble, MD;  Location: [facility];  Service: ENT;  Laterality: N/A;  . RADICAL NECK DISSECTION N/A 01/06/2020   Procedure: RADICAL NECK DISSECTION;  Surgeon: Izora Gala, MD;  Location: [facility];  Service: ENT;  Laterality: N/A;    There were no vitals filed for this visit.          ADULT SLP TREATMENT - 01/23/21 1159      General Information   Behavior/Cognition Pleasant mood;Lethargic;Requires cueing;Distractible      Treatment Provided   Treatment provided Dysphagia      Dysphagia Treatment   Other treatment/comments Pt reports he went to see ENT at [facility] yesterday but unfortunately SLP can only find pt's vitals from visit yesterday in [place], there is currently no other documentation. Pt states MD yesterday told him "the back of my throat is swollen from radiation." Pt cannot tell SLP any more detail than this. [name] saw [facility] "last week" (actualy 01-10-21), and got an antibiotic due to suspected candidiasis. Pt reports his tongue  feels like the swelling has subsided somewhat. Pt reports he has not done exercises to the degree prescribed by SLP -maybe 5-10 reps/day of each (40 reps each was recommended/day). Pt did not perform oral care prior to ST today so SLP did not attempt any POs with pt. Since pt has had difficulty with performing the prescribed number of reps for his HEP, SLP provided pt a chart that he can track how many reps of each exercise he has completed/day. Pt with notable fatigue with both Mendelsohn and effortful swallow after 3 reps. SLP told pt to perform swallows piecemeal throughout the day but aim for no < 5 reps at once.      Pain Assessment   Pain Assessment 0-10     Pain Score 6     Pain Location throat    Pain Descriptors / Indicators Sharp;Sore    Pain Intervention(s) Premedicated before session   hycet (hydrocodone/acetameniphen)     Assessment / Recommendations / Plan   Plan Continue with current plan of care      Progression Toward Goals   Progression toward goals Not progressing toward goals (comment)   not performing HEP as directed           SLP Education - 01/23/21 1317    Education Details need to do HEP as directed    Person(s) Educated Patient    Methods Explanation;Handout    Comprehension Verbalized understanding            SLP Short Term Goals - 01/23/21 1320      SLP SHORT TERM GOAL #1   Title pt will demo correct procedure for HEP with rare min A, in 2 sessions    Baseline 01/02/21, 01-07-21    Period --   or 9 visits, for all STGs except STG #2   Status Achieved      SLP SHORT TERM GOAL #2   Title pt will tell SLP why he is completing HEP with rare min A    Baseline 12-26-20    Period --   or 5 total visits   Status Achieved      SLP SHORT TERM GOAL #3   Title pt will demo swallow precautions with occasional min A in two sessions    Status Deferred   pt now NPO     SLP SHORT TERM GOAL #4   Title pt will tell SLP 3 overt s/sx aspiration PNA in 2 sessions after SLP review of overt s/sx    Baseline 12-26-20, 01-07-21    Status Achieved      SLP SHORT TERM GOAL #5   Title pt will complete 10 effortful swallows in 60 seconds    Time 1    Period Weeks    Status On-going            SLP Long Term Goals - 01/23/21 1321      SLP LONG TERM GOAL #1   Title pt will demo proper procedure for HEP with modified independence over 3 sessions    Baseline 01-09-21    Time 4    Period Weeks   or 17 visits, for all LTGs   Status On-going      SLP LONG TERM GOAL #2   Title pt will perform 10 reps Mendelsohn in 75 seconds    Time 4    Period Weeks    Status On-going      SLP LONG TERM GOAL #3   Title pt will demo  aspiration precauations with POs with occasional min A in 4 sessions total    Status Deferred   pt now NPO           Plan - 01/23/21 1318    Clinical Impression Statement [name] presents today with severe dysphagia after FEES at [facility] on 12-03-20.See that report for details. NPO was recommended with ice chips/water with SLP after meticulous oral care. Pt cont to complete HEP with significantly less than suggested # of reps/day. Intensive swallow therapy was recommended after FEES. See "other comments" for more details of today's session. SLP will need to continue to follow to track proper completion of HEP and to assess safety with POs (pt's following of aspiration precautions).    Speech Therapy Frequency 2x / week   once approx every 4 weeks   Duration 8 weeks   or 17 total sessions   Treatment/Interventions Aspiration precaution training;Pharyngeal strengthening exercises;Trials of upgraded texture/liquids;Diet toleration management by SLP;Internal/external aids;Patient/family education;SLP instruction and feedback;Environmental controls;Compensatory strategies    Potential to Achieve Goals Fair    Potential Considerations Previous level of function;Severity of impairments;Cooperation/participation level;Other (comment)   limited literacy   SLP Home Exercise Plan reviewed - Mendelsohn and Effortful swallow today    Consulted and Agree with Plan of Care Patient           Patient will benefit from skilled therapeutic intervention in order to improve the following deficits and impairments:   Pharyngeal dysphagia    Problem List Patient Active Problem List   Diagnosis Date Noted  . Shortness of breath 01/10/2021  . Mucositis due to antineoplastic therapy 01/10/2021  . G tube feedings ([place]) 01/10/2021  . Chemotherapy-induced diarrhea 10/30/2020  . Port-A-Cath in place 10/23/2020  . Cancer of tonsillar fossa ([place]) 08/31/2020  . Laryngeal cancer ([place]) 01/06/2020  . Metastatic cancer  to cervical lymph nodes ([place]) 12/01/2019  . Chronic hepatitis C without hepatic coma ([place]) 06/09/2019  . 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 Squamous cell carcinoma of neck 04/01/2019    [facility] ,MS, CCC-SLP  01/23/2021, 1:21 PM  [facility] [address] [place], [place], [address] Phone: [phone]   Fax:  [phone]   Name: [name] MRN: [id number] Date of Birth: [birth year]

## 2021-01-23 NOTE — Patient Instructions (Signed)
SLP provided pt an exercise chart to track reps/day of each of his two exercises.

## 2021-01-24 ENCOUNTER — Inpatient Hospital Stay: Payer: Medicare HMO | Attending: Hematology and Oncology | Admitting: Hematology and Oncology

## 2021-01-24 ENCOUNTER — Encounter: Payer: Self-pay | Admitting: Hematology and Oncology

## 2021-01-24 VITALS — BP 125/83 | HR 90 | Temp 97.8°F | Resp 18 | Wt 120.0 lb

## 2021-01-24 DIAGNOSIS — C090 Malignant neoplasm of tonsillar fossa: Secondary | ICD-10-CM | POA: Diagnosis not present

## 2021-01-24 DIAGNOSIS — Z931 Gastrostomy status: Secondary | ICD-10-CM | POA: Insufficient documentation

## 2021-01-24 DIAGNOSIS — R112 Nausea with vomiting, unspecified: Secondary | ICD-10-CM | POA: Diagnosis not present

## 2021-01-24 DIAGNOSIS — Z923 Personal history of irradiation: Secondary | ICD-10-CM | POA: Insufficient documentation

## 2021-01-24 DIAGNOSIS — Z9221 Personal history of antineoplastic chemotherapy: Secondary | ICD-10-CM | POA: Insufficient documentation

## 2021-01-24 DIAGNOSIS — C321 Malignant neoplasm of supraglottis: Secondary | ICD-10-CM | POA: Diagnosis not present

## 2021-01-24 DIAGNOSIS — C329 Malignant neoplasm of larynx, unspecified: Secondary | ICD-10-CM | POA: Insufficient documentation

## 2021-01-24 NOTE — Assessment & Plan Note (Signed)
He says he doesn't eat because of aspiration risk, will follow with SLP Currently on G tube feedings, Weight about the same. Encouraged him to stay in touch with SLP for further recommendations about eating

## 2021-01-24 NOTE — Progress Notes (Signed)
[facility] FOLLOW UP NOTE  Patient Care Team: [name], MD as PCP - General (Internal Medicine) Izora Gala, MD as Consulting Physician (Otolaryngology) [name], MD as Attending Physician (Radiation Oncology) Leota Sauers, RN (Inactive) as Oncology Nurse Navigator Karie Mainland, RD as Dietitian (Nutrition) Valentino Saxon [place], CCC-SLP as Speech Language Pathologist (Speech Pathology) Kennith Center, LCSW as Social Worker [name], MD as Consulting Physician (Hematology and Oncology) Malmfelt, Stephani Police, RN as Oncology Nurse Navigator  CHIEF COMPLAINTS/PURPOSE OF CONSULTATION:  Follow up after completion of chemoradiation for laryngeal cancer, nausea and vomiting.  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 No concern for recurrence or residual disease on exam PE unremarkable, no concern for cervical lymphadenopathy. He will proceed with End of treatment PET and RTC with [place] for FU to discuss additional recommendations.  Nausea and vomiting Non intractable nausea and vomiting, unknown etiology He is not on any medication that could contribute at this time. He says he is taking zofran PRN, but cant confirm the name of the drug. Requested that he see GI for further recommendations since this is unlikely related to the cancer or treatment. Also I recommended he bring all his meds for future visits so we can reconcile.  G tube feedings [facility]) He says he doesn't eat because of aspiration risk, will follow with SLP Currently on G tube feedings, Weight about the same. Encouraged him to stay in touch with SLP for further recommendations about eating  Orders Placed This Encounter   Procedures  . Ambulatory referral to Gastroenterology    Referral Priority:   Routine    Referral Type:   Consultation    Referral Reason:   Specialty Services Required    Number of Visits Requested:   1     HISTORY OF PRESENTING ILLNESS:  [name] 70 y.o. male is here for follow up on ongoing nausea and vomiting  Oncology History Overview Note    [name] is a 70 y.o. male who has been treated for laryngeal cancer in the past.   Aug 2020, Pt had presented with dysphagia and difficulty breathing through his mouth, 20 lb weight loss. He saw Dr. Erik Obey who performed laryngoscopy and appreciated a 2 to 3 cm ovoid pedunculated mass arising from the right aryepiglottic fold and impinging on the supraglottic airway.  The vocal cords appeared to be mobile.  Urgent panendoscopy and biopsy were recommended to protect his airway.   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 According to his op note, a bulky necrotic and semi-pedunculated tumor coming off the lateral surface of the right aryepiglottic fold and the anterior and lateral aspect of the piriform sinus on the right side.  Airway was compromised by the tumor mass at intubation.   CT scan of the neck on 04/11/2019 showed glottic closure with no asymmetry of the cords, correlate with laryngoscopy results.  Questionable right level 2 lymph node measured 7 mm.  No definite pathologic lymph nodes in the neck.   PET scan on 04/22/2019 revealed mild residual activity in the posterior right hypopharynx confined to the mucosa with no evidence of metastatic disease to the neck or distantly.  He underwent definitive radiotherapy to T2N0 laryngeal cancer  since he refused surgical treatment.  He then had PET imaging which showed a right neck lymph node, suspicious on PET. Biopsy of right neck lymph node is positive for recurrence. He underwent right  modified radical neck dissection including levels 2 and 3, including the internal jugular vein, suture marks the inferior jugular vein stump.  He most recently underwent a soft tissue neck CT scan on the date of 07/05/2020 that revealed asymmetric soft tissue thickening and enhancement along the right lateral pharynx. It also showed suspicious small 6 mm hyperenhancing nodular soft tissue along the posterior margin of the neck dissect at right level 3. Additionally, there was a mild inflammatory appearing right upper lobe centrilobular ground-glass opacity that was new (consider mild or developing upper lobe infection), post radiation changes to the lung apices, and sequelae of radiation and right neck dissection from previous neck CT.   Subsequently, the patient underwent a direct laryngoscopy and biopsy of oropharyngeal mass on the date of 08/13/2020. Pathology from the procedure revealed poorly differentiated squamous cell carcinoma.  PET showed recurrence of head and neck cancer with broad hypermetabolic pharyngeal mucosal lesion involving right lateral posterior oropharynx and hypopharynx extending from palatine tonsil to the vallecula.Hypermetabolic lymph node posterior sternocleidomastoid muscle on the RIGHT (level 3).Hypermetabolic nodule within the LEFT parotid glands favored small primary parotid neoplasm.  Recommendation was to consider proceeding with upfront concurrent chemo radiation followed by salvage surgery if needed. He completed 6 weekly cycles of chemotherapy and concurrent radiation, 10/2020.   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 SEE NOTE   04/11/2019 Imaging   CT neck: IMPRESSION: The glottis is closed with suboptimal evaluation of the vocal cords. No asymmetry of the cords. Correlate with laryngoscopy results.   No enlarged lymph nodes in the neck. 7 mm right level 2 lymph node may be reactive. No definite pathologic lymph nodes in the neck.   04/22/2019 Imaging   PET: IMPRESSION: 1. Mild residual activity in the posterior RIGHT hypopharynx confined to the mucosa. 2. No evidence of hypermetabolic metastatic lymph nodes in LEFT or RIGHT neck. 3. No evidence distant metastatic disease.   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 He says he still has some nausea, vomiting, throws up twice a day although he hasnt been eating. He says speech therapist has asked him not to eat because of aspiration risk He is using G tube for feeding, weight change of about 2 lbs. No change in breathing, bowel or urinary habits, has ongoing cough and feels the need to spit. He complains of some  pain in his mouth No new neurological complaints. Rest of the pertinent 10 point ROS reviewed and negative.   MEDICAL HISTORY:  Past Medical History:  Diagnosis Date  . Cancer ([place])    Throat cancer 2019  . ETOH abuse   . Frequent urination   . Glaucoma   . Hepatitis C virus infection without hepatic coma    dx'ed in 11/2018  . History of radiation therapy 05/12/19- 07/06/19   Larynx  . Hypertension   .  Wears denture    upper only; lost lower denture    SURGICAL HISTORY: Past Surgical History:  Procedure Laterality Date  . ANKLE SURGERY  2011   right ankle  . COLONOSCOPY  02/2019   polyps - Dr Havery Moros  . DIRECT LARYNGOSCOPY N/A 04/01/2019   Procedure: DIRECT LARYNGOSCOPY WITH BIOPSY;  Surgeon: Jodi Marble, MD;  Location: [place];  Service: ENT;  Laterality: N/A;  . DIRECT LARYNGOSCOPY N/A 01/06/2020   Procedure: DIRECT LARYNGOSCOPY;  Surgeon: Izora Gala, MD;  Location: [place];  Service: ENT;  Laterality: N/A;  . DIRECT LARYNGOSCOPY N/A 08/13/2020   Procedure: DIRECT LARYNGOSCOPY;  Surgeon: Izora Gala, MD;  Location: [place];  Service: ENT;  Laterality: N/A;  . ESOPHAGOSCOPY N/A 04/01/2019   Procedure: ESOPHAGOSCOPY;  Surgeon: Jodi Marble, MD;  Location: [place];  Service: ENT;  Laterality: N/A;  . EXCISION ORAL TUMOR Right 08/13/2020   Procedure: BIOPSY OF OROPHARYNGEAL MASS;  Surgeon: Izora Gala, MD;  Location: [place];  Service: ENT;  Laterality: Right;  . EYE SURGERY Right   . IR CM INJ ANY COLONIC TUBE W/FLUORO  12/20/2020  . IR GASTROSTOMY TUBE MOD SED  09/24/2020  . IR IMAGING GUIDED PORT INSERTION  09/24/2020  . KNEE SURGERY    . LARYNGOSCOPY AND BRONCHOSCOPY N/A 04/01/2019   Procedure: BRONCHOSCOPY;  Surgeon: Jodi Marble, MD;  Location: [place];  Service: ENT;  Laterality: N/A;  . RADICAL NECK DISSECTION N/A 01/06/2020   Procedure: RADICAL NECK DISSECTION;  Surgeon: Izora Gala, MD;  Location: [facility];  Service: ENT;  Laterality: N/A;    SOCIAL HISTORY: Social History   Socioeconomic History  . Marital status: Single    Spouse name: Not on file  . Number of children: 2  . Years of education: Not on file  . Highest education level: Not on file  Occupational History  . Not on file  Tobacco Use  . Smoking status: Former Smoker    Years: 50.00    Types: Cigarettes    Quit date: 06/01/2020    Years since quitting: 0.6  .  Smokeless tobacco: Never Used  Vaping Use  . Vaping Use: Never used  Substance and Sexual Activity  . Alcohol use: Yes    Alcohol/week: 4.0 standard drinks    Types: 4 Cans of beer per week    Comment: 40oz beer daily  . Drug use: Yes    Types: Cocaine    Comment: none in 2 yrs  . Sexual activity: Yes    Partners: Female  Other Topics Concern  . Not on file  Social History Narrative   Patient is divorced with 2 children.   Patient is currently living with his sister.   Patient with a history of smoking a third of pack of cigarettes daily for 50 years.  Patient currently smoking 2 to 3 cigarettes/day.   Patient has never used smokeless tobacco.   Patient with occasional use of alcohol.   Patient  last used cocaine approximately 6 months ago.  Patient denies use of marijuana.   Social Determinants of Health   Financial Resource Strain: Not on file  Food Insecurity: No Food Insecurity  . Worried About Charity fundraiser in the Last Year: Never true  . Ran Out of Food in the Last Year: Never true  Transportation Needs: No Transportation Needs  . Lack of Transportation (Medical): No  . Lack of Transportation (Non-Medical): No  Physical Activity: Not on file  Stress: Not on file  Social Connections: Unknown  . Frequency of Communication with Friends and Family: Three times a week  . Frequency of Social Gatherings with Friends and Family: More than three times a week  . Attends Religious Services: Not on file  . Active Member of Clubs or Organizations: Not on file  . Attends Archivist Meetings: Not on file  . Marital Status: Not on file  Intimate Partner Violence: Not on file    FAMILY HISTORY: Family History  Problem Relation Age of Onset  . Breast cancer Sister   . Colon cancer Brother 62       ????  . Cancer Brother     ALLERGIES:  has No Known Allergies.  MEDICATIONS:  Current Outpatient Medications  Medication Sig Dispense Refill  . atropine 1 %  ophthalmic solution Place 1 drop into the right eye 2 (two) times a day.     . cetirizine (ZYRTEC) 10 MG tablet Take 10 mg by mouth daily as needed for allergies.    Marland Kitchen dexamethasone (DECADRON) 4 MG tablet Take 2 tablets (8 mg total) by mouth daily. Take daily x 3 days starting the day after cisplatin chemotherapy. Take with food. 30 tablet 1  . docusate sodium (COLACE) 50 MG capsule Take 1 capsule (50 mg total) by mouth 2 (two) times daily. 10 capsule 0  . fluconazole (DIFLUCAN) 200 MG tablet Take 1 tablet (200 mg total) by mouth daily. 1 tablet 0  . HYDROcodone-acetaminophen (HYCET) 7.5-325 mg/15 ml solution Take 10 mLs by mouth 4 (four) times daily as needed for moderate pain (Cancer pain). 473 mL 0  . ibuprofen (ADVIL) 200 MG tablet Take 400 mg by mouth every 6 (six) hours as needed for headache or moderate pain.    Marland Kitchen latanoprost (XALATAN) 0.005 % ophthalmic solution Place 1 drop into the right eye at bedtime.     . lidocaine (XYLOCAINE) 2 % solution Patient: Mix 1part 2% viscous lidocaine, 1part H20. Swish AND swallow 27mL of diluted mixture, 27min before meals and at bedtime, up to QID 200 mL 3  . lidocaine-prilocaine (EMLA) cream Apply to affected area once (Patient taking differently: Apply 1 application topically daily as needed (port access). Apply to affected area once) 30 g 3  . LORazepam (ATIVAN) 0.5 MG tablet Take 1 tablet (0.5 mg total) by mouth every 6 (six) hours as needed (Nausea or vomiting). 30 tablet 0  . losartan (COZAAR) 50 MG tablet Take 50 mg by mouth daily.    . Nutritional Supplements (FEEDING SUPPLEMENT, OSMOLITE 1.5 CAL,) LIQD Give 1 carton Osmolite 1.5 via PEG 4 times daily with 90 mL free water before and after bolus feeding.  Increase to 1-1/2 cartons 4 times daily.  In addition, drink or flush tube with an additional 240 mL free water twice daily.  This provides 2130 cal, 89.4 g protein and 2352 mL free water which is 100% estimated needs. 1422 mL 0  . ondansetron  (ZOFRAN) 8 MG  tablet Take 1 tablet (8 mg total) by mouth 2 (two) times daily as needed. Start on the third day after cisplatin chemotherapy. 30 tablet 1  . prednisoLONE acetate (PRED FORTE) 1 % ophthalmic suspension Place 1 drop into both eyes daily.    . prochlorperazine (COMPAZINE) 10 MG tablet Take 1 tablet (10 mg total) by mouth every 6 (six) hours as needed (Nausea or vomiting). 30 tablet 1  . tolterodine (DETROL LA) 4 MG 24 hr capsule Take 4 mg by mouth daily.    . Vitamin D, Ergocalciferol, (DRISDOL) 1.25 MG (50000 UNIT) CAPS capsule Take 50,000 Units by mouth once a week.     No current facility-administered medications for this visit.    PHYSICAL EXAMINATION:   ECOG PERFORMANCE STATUS: 0 - Asymptomatic  Vitals:   01/24/21 1332  BP: 125/83  Pulse: 90  Resp: 18  Temp: 97.8 F (36.6 C)  SpO2: 100%   Filed Weights   01/24/21 1332  Weight: 120 lb (54.4 kg)   Physical Exam Constitutional:      General: He is not in acute distress.    Comments: Frail   HENT:     Head: Normocephalic.     Mouth/Throat:     Mouth: Mucous membranes are moist.     Pharynx: No oropharyngeal exudate or posterior oropharyngeal erythema.  Cardiovascular:     Rate and Rhythm: Normal rate and regular rhythm.  Pulmonary:     Effort: Pulmonary effort is normal.  Abdominal:     General: Abdomen is flat.     Palpations: Abdomen is soft.     Comments: G tube with some yellow drainage from around the tube, no clear evidence of infection   Musculoskeletal:        General: No swelling or tenderness.     Cervical back: Normal range of motion and neck supple. No rigidity.  Lymphadenopathy:     Cervical: No cervical adenopathy.  Skin:    General: Skin is warm and dry.  Neurological:     General: No focal deficit present.     Mental Status: He is alert.  Psychiatric:        Mood and Affect: Mood normal.     LABORATORY DATA:  I have reviewed the data as listed Lab Results  Component Value  Date   WBC 3.1 (L) 01/10/2021   HGB 11.2 (L) 01/10/2021   HCT 33.8 (L) 01/10/2021   MCV 96.3 01/10/2021   PLT 199 01/10/2021     Chemistry      Component Value Date/Time   NA 140 01/10/2021 0921   K 4.3 01/10/2021 0921   CL 101 01/10/2021 0921   CO2 29 01/10/2021 0921   BUN 8 01/10/2021 0921   CREATININE 0.67 01/10/2021 0921   CREATININE 0.61 (L) 10/26/2019 1523      Component Value Date/Time   CALCIUM 9.6 01/10/2021 0921   ALKPHOS 60 01/10/2021 0921   AST 15 01/10/2021 0921   ALT 6 01/10/2021 0921   ALT 53 (H) 09/06/2019 1543   BILITOT 0.4 01/10/2021 0921       RADIOGRAPHIC STUDIES: I have personally reviewed the radiological images as listed and agreed with the findings in the report. DG Chest 2 View  Result Date: 01/12/2021 CLINICAL DATA:  Worsening shortness of breath EXAM: CHEST - 2 VIEW COMPARISON:  June 16, 2013 FINDINGS: A left Port-A-Cath terminates in the central SVC. The heart, hila, mediastinum, lungs, and pleura are otherwise unremarkable. No nodules, masses, or  focal infiltrates. IMPRESSION: A left Port-A-Cath is in good position.  No acute abnormalities. Electronically Signed   By: Dorise Bullion III M.D   On: 01/12/2021 16:32    All questions were answered. The patient knows to call the clinic with any problems, questions or concerns.      [name], MD 01/24/2021 3:09 PM

## 2021-01-24 NOTE — Assessment & Plan Note (Addendum)
Non intractable nausea and vomiting, unknown etiology He is not on any medication that could contribute at this time. He says he is taking zofran PRN, but cant confirm the name of the drug. Requested that he see GI for further recommendations since this is likely unrelated from chemo. Also I recommended he bring all his meds for future visits so we can reconcile.

## 2021-01-24 NOTE — Assessment & Plan Note (Addendum)
This is a very pleasant 70 year old male patient with history of recurrent laryngeal cancer with a right oropharyngeal mass s/p concurrent CRT now here for FU He complains of ongoing nausea and vomiting, likely unrelated to treatment. No concern for recurrence or residual disease on exam PE unremarkable, no concern for cervical lymphadenopathy. He will proceed with End of treatment PET and RTC with [place] for FU to discuss additional recommendations.

## 2021-01-28 ENCOUNTER — Ambulatory Visit: Payer: Medicare HMO

## 2021-01-29 ENCOUNTER — Ambulatory Visit: Payer: Medicare HMO | Admitting: Radiation Oncology

## 2021-01-29 ENCOUNTER — Ambulatory Visit: Payer: Medicare HMO

## 2021-01-29 ENCOUNTER — Other Ambulatory Visit: Payer: Self-pay

## 2021-01-29 ENCOUNTER — Inpatient Hospital Stay: Payer: Medicare HMO | Admitting: Dietician

## 2021-01-29 NOTE — Progress Notes (Signed)
Nutrition Follow-up:  Patient is s/p treatment for recurrent laryngeal cancer.   Met with patient in clinic today. He reports nausea and vomiting over the past couple of weeks. Patient reports he has not been eating or drinking by mouth, continues to work with SLP. He reports giving 2 cartons of Osmolite 1.5 via PEG. Patient reports this takes him less than 30 minutes. He has also been giving 1-2 Ensure via PEG directly after feedings. Patient says he lays down after bolus feedings due to nausea and "feels like it wants to come back up when I lay down" Patient reports having diarrhea once or twice/week but usually has regular bowl movements.    Medications: reviewed  Labs: reviewed  Anthropometrics: Patient 119.6 lb today in office   5/12 - 120 lb 5/2 - 122 lb 4.8 oz 4/12 - 118 lb 9.6 oz  Estimated Energy Needs  Kcals: 2000-2200 Protein: 80-95 Fluid: 2.2 L  NUTRITION DIAGNOSIS: Unintended weight loss stable    INTERVENTION:  Diet advancement per SLP Continue Osmolite 1.5 - 2 cartons TID with 120 ml free water flush before and after each feeding Discussed strategies for improving nausea Educated on allowing 20 minutes per carton of tube feed (40 minutes per feeding) Discussed not laying down for 1 hour after feedings   Discussed not giving Ensure via tube at same time as regular feedings due to increased volume likely contributing to nausea/vomiting after feedings Print out of upcoming appointments provided per patient request   Osmolite 1.5 - 2 cartons TID with 120 ml water flush before and after each feeding provides 2130 kcal, 89.4 grams protein, 2352 ml free water  MONITORING, EVALUATION, GOAL: weight trends, tube feeding, diet advancement per ST   NEXT VISIT: Friday June 3 in clinic

## 2021-01-30 ENCOUNTER — Other Ambulatory Visit: Payer: Self-pay

## 2021-01-30 ENCOUNTER — Ambulatory Visit: Payer: Medicare HMO

## 2021-01-30 DIAGNOSIS — Z1329 Encounter for screening for other suspected endocrine disorder: Secondary | ICD-10-CM

## 2021-01-31 ENCOUNTER — Encounter (HOSPITAL_COMMUNITY)
Admission: RE | Admit: 2021-01-31 | Discharge: 2021-01-31 | Disposition: A | Discharge status: Home / Self Care | Payer: Medicare HMO | Source: Ambulatory Visit | Attending: Radiation Oncology | Admitting: Radiation Oncology

## 2021-01-31 ENCOUNTER — Other Ambulatory Visit: Payer: Self-pay

## 2021-01-31 ENCOUNTER — Telehealth: Payer: Self-pay | Admitting: *Deleted

## 2021-01-31 DIAGNOSIS — C4442 Squamous cell carcinoma of skin of scalp and neck: Secondary | ICD-10-CM | POA: Diagnosis not present

## 2021-01-31 DIAGNOSIS — Z79899 Other long term (current) drug therapy: Secondary | ICD-10-CM | POA: Diagnosis not present

## 2021-01-31 LAB — GLUCOSE, CAPILLARY: Glucose-Capillary: 96 mg/dL (ref 70–99)

## 2021-01-31 MED ORDER — FLUDEOXYGLUCOSE F - 18 (FDG) INJECTION
6.0000 | Freq: Once | INTRAVENOUS | Status: AC | PRN
  Administered 2021-01-31: 5.9 via INTRAVENOUS

## 2021-01-31 NOTE — Telephone Encounter (Signed)
Called patient's sister- Bethena Midget to remind of lab and fu for 02-01-21, lvm for a return call

## 2021-01-31 NOTE — Telephone Encounter (Signed)
xxxx 

## 2021-02-01 ENCOUNTER — Ambulatory Visit
Admission: RE | Admit: 2021-02-01 | Discharge: 2021-02-01 | Disposition: A | Discharge status: Home / Self Care | Payer: Medicare HMO | Source: Ambulatory Visit | Attending: Radiation Oncology | Admitting: Radiation Oncology

## 2021-02-01 VITALS — BP 124/70 | HR 98 | Temp 97.2°F | Resp 18 | Ht 68.0 in | Wt 116.4 lb

## 2021-02-01 DIAGNOSIS — Z1329 Encounter for screening for other suspected endocrine disorder: Secondary | ICD-10-CM | POA: Insufficient documentation

## 2021-02-01 DIAGNOSIS — C4442 Squamous cell carcinoma of skin of scalp and neck: Secondary | ICD-10-CM | POA: Diagnosis present

## 2021-02-01 DIAGNOSIS — C090 Malignant neoplasm of tonsillar fossa: Secondary | ICD-10-CM | POA: Insufficient documentation

## 2021-02-01 LAB — TSH: TSH: 0.899 u[IU]/mL (ref 0.320–4.118)

## 2021-02-01 MED ORDER — OXYMETAZOLINE HCL 0.05 % NA SOLN
1.0000 | Freq: Once | NASAL | Status: AC
  Administered 2021-02-01: 1 via NASAL

## 2021-02-01 NOTE — Progress Notes (Signed)
[name] presents today for follow-up after completing radiation to his oropharynx on 10/31/2020  Pain issues, if any: Reports his throat feels sore occasionally when he goes to swallow something Using a feeding tube?: Yes--Met with [name] on 01/29/2021: "Met with patient in clinic today. He reports nausea and vomiting over the past couple of weeks. Patient reports he has not been eating or drinking by mouth, continues to work with SLP. He reports giving 2 cartons of Osmolite 1.5 via PEG. Patient reports this takes him less than 30 minutes. He has also been giving 1-2 Ensure via PEG directly after feedings. Patient says he lays down after bolus feedings due to nausea and "feels like it wants to come back up when I lay down" Patient reports having diarrhea once or twice/week but usually has regular bowl movements.  NEXT VISIT: Friday June 3 in clinic" Weight changes, if any:  Wt Readings from Last 3 Encounters:  02/01/21 116 lb 6 oz (52.8 kg)  01/24/21 120 lb (54.4 kg)  01/10/21 122 lb 4.8 oz (55.5 kg)   Swallowing issues, if any: Yes--continues to struggle to swallow even liquids by mouth. Scheduled for SLP evaluation on 02/06/2021 Smoking or chewing tobacco? Reports he hasn't had a cigarette since around the [employer] this year (10/2020) Using fluoride trays daily? N/A Last ENT visit was on: 01/21/2021 Saw [name] (resident with [name])  "Impression & Plans:  [name] is a 70 y.o. male with a history of T2N0 right pyriform sinus/aeryepiglotic fold squamous cell carcinoma s/p definitive radiotherapy of the supraglottis and bilateral necks (04/2019-06/2019) and right MRND 12/2019, with recurrence of right oropharyngeal squamous cell carcinoma s/p CRT (6 weeks, completed week of 10/29/20). TFL today demonstrates blanching of the mucosa with associated edema along the right vallecula/pharynx consistent with likely post radiation treatment effects, although  persistent disease remains on the differential. I will plan to order a CT neck with contrast for surveillance imaging, with instructions provided to [name] and [name] that if he has imaging already ordered through [name] office for them to call our office of the CT imaging could be canceled. Both expressed their understanding. I also strongly advised him to continue following up with his swallowing therapy appointments given the significant risk for G-tube dependence in light of 2 rounds of radiation. Smoking cessation also strongly advised given his clinical history. - Follow up with [name] (radiation oncology) and [name] (oncology) in [name] as scheduled -Surveillance CT neck with contrast ordered-call if surveillance imaging is already scheduled through [name] or [name] so that our order may be canceled -Dietary recommendations per SLP -Follow-up in 1 month for repeat TFL given questionable radiation changes versus persistent disease - Call with questions or concerns"  Other notable issues, if any: Reports occasional sharp ear pain on the right side. Reports build-up of ear wax but denies any drainage or signs of bleeding to his ear. Denies any jaw pain but does state he is unable to open his mouth fully. Continues to deal thick/copious secretions. Reports lingering fatigue and states he occasionally has difficulty falling asleep/staying asleep.   Vitals:   02/01/21 1451  BP: 124/70  Pulse: 98  Resp: 18  Temp: (!) 97.2 F (36.2 C)  SpO2: 100%

## 2021-02-04 ENCOUNTER — Other Ambulatory Visit: Payer: Self-pay

## 2021-02-04 ENCOUNTER — Ambulatory Visit: Payer: Medicare HMO

## 2021-02-04 DIAGNOSIS — C321 Malignant neoplasm of supraglottis: Secondary | ICD-10-CM

## 2021-02-04 NOTE — Progress Notes (Signed)
Oncology Nurse Navigator Documentation  At the request of Dr. Chryl Heck I have requested PDL1 testing be ordered for [name]' Oropharynx biopsy on 08/13/2020. I spoke with Dominica in Pathology and she verbalized that she will get the order placed as requested.   Harlow Asa RN, BSN, OCN Head & Neck Oncology Nurse [place] at [facility] Phone # [phone]  Fax # [phone]

## 2021-02-05 ENCOUNTER — Telehealth: Payer: Self-pay

## 2021-02-05 ENCOUNTER — Encounter: Payer: Self-pay | Admitting: Hematology and Oncology

## 2021-02-05 NOTE — Telephone Encounter (Signed)
Attempted to contact patient's contacts [place] to schedule a Palliative Care consult appointment. No answer left a message to return call for [name]. Veronica's voicemail is full.

## 2021-02-05 NOTE — Telephone Encounter (Signed)
Spoke with patient's sister [name]. Patient was gone to another appointment. She state she will call back on 5/25 to schedule consult.

## 2021-02-06 ENCOUNTER — Telehealth: Payer: Self-pay | Admitting: Hematology and Oncology

## 2021-02-06 ENCOUNTER — Other Ambulatory Visit: Payer: Self-pay

## 2021-02-06 ENCOUNTER — Telehealth: Payer: Self-pay

## 2021-02-06 ENCOUNTER — Ambulatory Visit: Payer: Medicare HMO

## 2021-02-06 DIAGNOSIS — R1313 Dysphagia, pharyngeal phase: Secondary | ICD-10-CM | POA: Diagnosis not present

## 2021-02-06 NOTE — Telephone Encounter (Signed)
Scheduled appt per 5/25 sch msg. Pt aware.  

## 2021-02-06 NOTE — Telephone Encounter (Signed)
Spoke with patient's sister [name] and scheduled an in-person Palliative Consult for 02/22/21 @ 12:30PM  COVID screening was negative. No pets in home. Patient lives with sister.  Consent obtained; updated Outlook/Netsmart/Team List and Epic.   Family is aware they may be receiving a call from NP the day before or day of to confirm appointment.

## 2021-02-06 NOTE — Progress Notes (Signed)
Radiation Oncology         [phone] ________________________________  Name: [name] MRN: [id number]  Date: 02/01/2021  DOB: 07/28/1951  Follow-Up Visit Note  CC: Alan Ebbs, MD  Alan Ebbs, MD  Diagnosis and Prior Radiotherapy:       ICD-10-CM   1. Squamous cell carcinoma of neck  C44.42 oxymetazoline (AFRIN) 0.05 % nasal spray 1 spray    Fiberoptic laryngoscopy   PREVIOUS RADIATION:  Radiation Treatment Dates: 05/12/2019 through 07/06/2019 Site Technique Total Dose (Gy) Dose per Fx (Gy) Completed Fx Beam Energies  Head & neck: HN_larynx IMRT 70/70 2 35/35 6X    Radiation Treatment Dates: 09/18/2020 through 10/31/2020 Site Technique Total Dose (Gy) Dose per Fx (Gy) Completed Fx Beam Energies  Oropharynx: HN_orophar IMRT 60/60 2 30/30 6X    CHIEF COMPLAINT:  Here for follow-up and surveillance of recurrent throat cancer  Narrative:   [name] presents today for follow-up after completing radiation to his oropharynx on 10/31/2020  Pain issues, if any: Reports his throat feels sore occasionally when he goes to swallow something  Using a feeding tube?: Yes--Met with [name] on 01/29/2021 NEXT VISIT: Friday June 3 in clinic  Weight changes, if any:  Wt Readings from Last 3 Encounters:  02/01/21 116 lb 6 oz (52.8 kg)  01/24/21 120 lb (54.4 kg)  01/10/21 122 lb 4.8 oz (55.5 kg)   Swallowing issues, if any: Yes--continues to struggle to swallow even liquids by mouth. Scheduled for SLP evaluation on 02/06/2021  Smoking or chewing tobacco? Reports he hasn't had a cigarette since around the [employer] this year (10/2020)  Using fluoride trays daily? N/A  Last ENT visit was on: 01/21/2021 Saw [name] (resident with [name])  Will be seen again in early June    ALLERGIES:  has No Known Allergies.  Meds: Current Outpatient Medications  Medication Sig Dispense Refill  . atropine 1 % ophthalmic solution Place 1 drop  into the right eye 2 (two) times a day.     . cetirizine (ZYRTEC) 10 MG tablet Take 10 mg by mouth daily as needed for allergies.    Marland Kitchen dexamethasone (DECADRON) 4 MG tablet Take 2 tablets (8 mg total) by mouth daily. Take daily x 3 days starting the day after cisplatin chemotherapy. Take with food. 30 tablet 1  . docusate sodium (COLACE) 50 MG capsule Take 1 capsule (50 mg total) by mouth 2 (two) times daily. 10 capsule 0  . fluconazole (DIFLUCAN) 200 MG tablet Take 1 tablet (200 mg total) by mouth daily. 1 tablet 0  . HYDROcodone-acetaminophen (HYCET) 7.5-325 mg/15 ml solution Take 10 mLs by mouth 4 (four) times daily as needed for moderate pain (Cancer pain). 473 mL 0  . ibuprofen (ADVIL) 200 MG tablet Take 400 mg by mouth every 6 (six) hours as needed for headache or moderate pain.    Marland Kitchen latanoprost (XALATAN) 0.005 % ophthalmic solution Place 1 drop into the right eye at bedtime.     . lidocaine (XYLOCAINE) 2 % solution Patient: Mix 1part 2% viscous lidocaine, 1part H20. Swish AND swallow 54m of diluted mixture, 384m before meals and at bedtime, up to QID 200 mL 3  . lidocaine-prilocaine (EMLA) cream Apply to affected area once (Patient taking differently: Apply 1 application topically daily as needed (port access). Apply to affected area once) 30 g 3  . LORazepam (ATIVAN) 0.5 MG tablet Take 1 tablet (0.5 mg total) by mouth every 6 (six)  hours as needed (Nausea or vomiting). 30 tablet 0  . losartan (COZAAR) 50 MG tablet Take 50 mg by mouth daily.    . Nutritional Supplements (FEEDING SUPPLEMENT, OSMOLITE 1.5 CAL,) LIQD Give 1 carton Osmolite 1.5 via PEG 4 times daily with 90 mL free water before and after bolus feeding.  Increase to 1-1/2 cartons 4 times daily.  In addition, drink or flush tube with an additional 240 mL free water twice daily.  This provides 2130 cal, 89.4 g protein and 2352 mL free water which is 100% estimated needs. 1422 mL 0  . ondansetron (ZOFRAN) 8 MG tablet Take 1 tablet (8 mg  total) by mouth 2 (two) times daily as needed. Start on the third day after cisplatin chemotherapy. 30 tablet 1  . prednisoLONE acetate (PRED FORTE) 1 % ophthalmic suspension Place 1 drop into both eyes daily.    . prochlorperazine (COMPAZINE) 10 MG tablet Take 1 tablet (10 mg total) by mouth every 6 (six) hours as needed (Nausea or vomiting). 30 tablet 1  . tolterodine (DETROL LA) 4 MG 24 hr capsule Take 4 mg by mouth daily.    . Vitamin D, Ergocalciferol, (DRISDOL) 1.25 MG (50000 UNIT) CAPS capsule Take 50,000 Units by mouth once a week.     No current facility-administered medications for this encounter.    Physical Findings: The patient is in no acute distress. Patient is alert and oriented. Wt Readings from Last 3 Encounters:  02/01/21 116 lb 6 oz (52.8 kg)  01/24/21 120 lb (54.4 kg)  01/10/21 122 lb 4.8 oz (55.5 kg)    height is _0  (1.727 m) and weight is 116 lb 6 oz (52.8 kg). His temporal temperature is 97.2 F (36.2 C) (abnormal). His blood pressure is 124/70 and his pulse is 98. His respiration is 18 and oxygen saturation is 100%. .  General: Alert and oriented, in no acute distress, cachectic HEENT: Temporal wasting evident; oropharynx is notable for no thrush or visible tumor though he does have a brisk gag reflex which makes exam difficult Neck: Supraclavicular and cervical regions demonstrate no obvious palpable adenopathy. Skin: Is intact over neck   PROCEDURE NOTE: After obtaining consent and anesthetizing the nasal cavity with topical oxymetazoline, the flexible endoscope was introduced and passed through the nasal cavity.  The nasopharynx, oropharynx, hypopharynx and larynx were then examined.  There is nonspecific edema favoring the right pharyngeal tissue, but no obvious  tumor visualized throughout these regions.  ECOG = 3  0 - Asymptomatic (Fully active, able to carry on all predisease activities without restriction)  1 - Symptomatic but completely ambulatory  (Restricted in physically strenuous activity but ambulatory and able to carry out work of a light or sedentary nature. For example, light housework, office work)  2 - Symptomatic, <50% in bed during the day (Ambulatory and capable of all self care but unable to carry out any work activities. Up and about more than 50% of waking hours)  3 - Symptomatic, >50% in bed, but not bedbound (Capable of only limited self-care, confined to bed or chair 50% or more of waking hours)  4 - Bedbound (Completely disabled. Cannot carry on any self-care. Totally confined to bed or chair)  5 - Death   Eustace Pen MM, Creech RH, Tormey DC, et al. [phone]). "Toxicity and response criteria of the [facility]". [place] Oncol. 5 (6): 649-55   Lab Findings: Lab Results  Component Value Date   WBC 3.1 (L)  01/10/2021   HGB 11.2 (L) 01/10/2021   HCT 33.8 (L) 01/10/2021   MCV 96.3 01/10/2021   PLT 199 01/10/2021    Lab Results  Component Value Date   TSH 0.899 02/01/2021    Radiographic Findings: DG Chest 2 View  Result Date: 01/12/2021 CLINICAL DATA:  Worsening shortness of breath EXAM: CHEST - 2 VIEW COMPARISON:  June 16, 2013 FINDINGS: A left Port-A-Cath terminates in the central SVC. The heart, hila, mediastinum, lungs, and pleura are otherwise unremarkable. No nodules, masses, or focal infiltrates. IMPRESSION: A left Port-A-Cath is in good position.  No acute abnormalities. Electronically Signed   By: Dorise Bullion III M.D   On: 01/12/2021 16:32   NM PET Image Restag (PS) Skull Base To Thigh  Result Date: 02/01/2021 CLINICAL DATA:  Subsequent treatment strategy for head neck cancer. EXAM: NUCLEAR MEDICINE PET SKULL BASE TO THIGH TECHNIQUE: 5.9 mCi F-18 FDG was injected intravenously. Full-ring PET imaging was performed from the skull base to thigh after the radiotracer. CT data was obtained and used for attenuation correction and anatomic localization. Fasting blood glucose: 96 mg/dl  COMPARISON:  Multiple prior studies most recent PET from December of 2021. FINDINGS: Mediastinal blood pool activity: SUV max 2.35 Liver activity: SUV max NA NECK: Marked loss of normal tissue planes throughout the neck due to post treatment changes. Compared to prior imaging there is near complete resolution of hypermetabolic activity across the mucosal surfaces of the oropharynx. Fullness of soft tissue remains without the degree of metabolism that was seen previously. Maximum SUV for instance at the base of the RIGHT tongue extending towards the lateral mucosal surfaces measures 4 with respect to maximum SUV. Previously as much as 10.6. There is however an 11 mm lymph node, short axis measurement on image 31 of series 4 that shows marked increased metabolism with a maximum SUV of 8.4 at LEFT level 2. No additional areas of increased metabolism to suggest persistent disease in the neck, low level activity throughout the treated area as discussed above. Incidental CT findings: none CHEST: RIGHT thoracic inlet just above the RIGHT clavicle (image 56 of series 4) with a hypermetabolic lymph node in this area measuring 12 mm short axis maximum SUV of 6.8 no additional hypermetabolic lymph nodes in the chest. No suspicious pulmonary nodules. Incidental CT findings: LEFT Port-A-Cath terminates at the caval to atrial junction. Normal heart size. Signs of coronary artery disease. No substantial pericardial effusion. Normal caliber of pulmonary vasculature. Scarring in the lingula. ABDOMEN/PELVIS: No abnormal hypermetabolic activity within the liver, pancreas, adrenal glands, or spleen. No hypermetabolic lymph nodes in the abdomen or pelvis. Incidental CT findings: Sludge in the gallbladder. No acute findings relative to liver, gallbladder, spleen, pancreas, adrenal glands and kidneys. Urinary bladder under distended. G-tube in place. No acute gastrointestinal findings. Colonic diverticulosis. Grossly unremarkable  appearance of reproductive structures. Aortic atherosclerosis without aneurysm. SKELETON: No focal hypermetabolic activity to suggest skeletal metastasis. Incidental CT findings: none IMPRESSION: Findings of worsening nodal disease with a node or group of nodes in the LEFT neck and a RIGHT supraclavicular lymph node as discussed. Post treatment changes with soft tissue fullness in the area of the prior tumor but with markedly diminished metabolic activity. Electronically Signed   By: Zetta Bills M.D.   On: 02/01/2021 15:41    Impression/Plan:    1) Head and Neck Cancer Status: I personally reviewed his pet imaging and discussed this with the patient and his caregiver.  While he had an  excellent response to salvage radiation therapy that had targeted  his right oropharynx and progressive adenopathy, there are new nodes that have developed outside of the reirradiation fields.  One is in the right lower neck and one is in the left upper neck.  He still does not demonstrate any distant metastatic disease.  I had a long discussion with the patient and his caregiver - due to his multiply recurrent head and neck cancer and suboptimal performance status he has a guarded prognosis.  However this still may be treatable with palliative systemic therapy.  I do not think that he is likely to be a good candidate for surgery.  I have contacted medical oncology and PD-L1 testing will be ordered on his tissue to see if he is a candidate for palliative immunotherapy.  We also discussed the option of hospice if aggressive palliative interventions are not ultimately in his best interest or desired by the patient. I will notify otolaryngology of these latest developments.  2) Nutritional Status: Decreased, continue to use feeding tube and follow with nutrition Wt Readings from Last 3 Encounters:  02/01/21 116 lb 6 oz (52.8 kg)  01/24/21 120 lb (54.4 kg)  01/10/21 122 lb 4.8 oz (55.5 kg)    PEG tube: Using  3) Risk  Factors: The patient has been educated about risk factors including alcohol and tobacco abuse; they understand that avoidance of alcohol and tobacco is important to prevent recurrences as well as other cancers   4) Swallowing: Continue to follow the instructions of speech-language pathology  5) Thyroid function: Check annually Lab Results  Component Value Date   TSH 0.899 02/01/2021    6) I will see him back in September.  He will see medical oncology as soon as possible, pending PD-L1 results.  He is scheduled to see otolaryngology in June and I will call Dr Conley Canal with the latest developments.    On date of service, in total, I spent 25 minutes on this encounter. Patient was seen in person. _____________________________________   [name], MD

## 2021-02-07 NOTE — Therapy (Signed)
[facility] [address] [place], [place], [address] Phone: [phone]   Fax:  [phone]  Speech Language Pathology Treatment/Progress note  Patient Details  Name: [name] MRN: [id number] Date of Birth: 1951-09-09 Referring Provider (SLP): [name], MD   Encounter Date: 02/06/2021   End of Session - 02/07/21 1012    Visit Number 10    Number of Visits 17    Date for SLP Re-Evaluation 03/13/21    Authorization Type humana    SLP Start Time 1115   arrived late; checked in 1114   SLP Stop Time  1148    SLP Time Calculation (min) 33 min    Activity Tolerance Patient tolerated treatment well           Past Medical History:  Diagnosis Date  . Cancer ([place])    Throat cancer 2019  . ETOH abuse   . Frequent urination   . Glaucoma   . Hepatitis C virus infection without hepatic coma    dx'ed in 11/2018  . History of radiation therapy 05/12/19- 07/06/19   Larynx  . Hypertension   . Wears denture    upper only; lost lower denture    Past Surgical History:  Procedure Laterality Date  . ANKLE SURGERY  2011   right ankle  . COLONOSCOPY  02/2019   polyps - Dr Havery Moros  . DIRECT LARYNGOSCOPY N/A 04/01/2019   Procedure: DIRECT LARYNGOSCOPY WITH BIOPSY;  Surgeon: Jodi Marble, MD;  Location: [facility];  Service: ENT;  Laterality: N/A;  . DIRECT LARYNGOSCOPY N/A 01/06/2020   Procedure: DIRECT LARYNGOSCOPY;  Surgeon: Izora Gala, MD;  Location: [facility];  Service: ENT;  Laterality: N/A;  . DIRECT LARYNGOSCOPY N/A 08/13/2020   Procedure: DIRECT LARYNGOSCOPY;  Surgeon: Izora Gala, MD;  Location: [facility];  Service: ENT;  Laterality: N/A;  . ESOPHAGOSCOPY N/A 04/01/2019   Procedure: ESOPHAGOSCOPY;  Surgeon: Jodi Marble, MD;  Location: [facility];  Service: ENT;  Laterality: N/A;  . EXCISION ORAL TUMOR Right 08/13/2020   Procedure: BIOPSY OF OROPHARYNGEAL MASS;  Surgeon: Izora Gala, MD;  Location:  [facility];  Service: ENT;  Laterality: Right;  . EYE SURGERY Right   . IR CM INJ ANY COLONIC TUBE W/FLUORO  12/20/2020  . IR GASTROSTOMY TUBE MOD SED  09/24/2020  . IR IMAGING GUIDED PORT INSERTION  09/24/2020  . KNEE SURGERY    . LARYNGOSCOPY AND BRONCHOSCOPY N/A 04/01/2019   Procedure: BRONCHOSCOPY;  Surgeon: Jodi Marble, MD;  Location: [facility];  Service: ENT;  Laterality: N/A;  . RADICAL NECK DISSECTION N/A 01/06/2020   Procedure: RADICAL NECK DISSECTION;  Surgeon: Izora Gala, MD;  Location: [facility];  Service: ENT;  Laterality: N/A;    There were no vitals filed for this visit.    Speech Therapy Progress Note  Dates of Reporting Period: 10-18-20 to present  Subjective Statement: Pt continues with sever-profound oropharyngeal dysphagia following radiation therapy. He has been seen for 10 sessions.  Objective: Pt continues to perform HEP suboptimal frequency/scope.  Goal Update: See below  Plan: See below  Reason Skilled Services are Required: Pt to decide on desire for skilled services to continue. See below.    Subjective Assessment - 02/06/21 1124    Subjective Pt had modified (MBSS) yesterday. "I guess I did pretty good." (re: SLP question "How did you do yesterday on the swallow test?")  ADULT SLP TREATMENT - 02/07/21 0954      General Information   Behavior/Cognition Pleasant mood;Lethargic;Requires cueing;Cooperative      Treatment Provided   Treatment provided Dysphagia      Dysphagia Treatment   Other treatment/comments Pt without oral care prior to visit today. [name] tells SLP that pt can swallow honey-thick liquids but is unsure how to obtain these. SLP referred her to drug store for thickener and told her to follow directions on side of container. SLP told pt and [name] that SLP note from yesterday stated "pleasure feeds" of honey thick, adn water after thorough oral care and strict adherence to aspiration precautions of  multiple swallows cough or throat clear with additional swallow. SLP explained to [place], and pt (again), what "thorough oral care" consisted of. SLP then demonstrated swallow precautions for pt/[name]. Highlihgts from modified (MBSS) yesterday include better management of secretions however severe to profound dysphagia still evident c/b mod oral residue across consistencies, decreased retraction of tongue base, absent hyolaryngeal excursion, reduced pharyngeal constriction, reduced closure of laryngeal vestibule. Mild-mod pharyngeal residue noted across all consistencies, with aspiration during and after the swallow on all consistencies - with caveat of honey once/8 boluses. Recommendation of NPO with aforementioned PO recommendations with precautions. Pt endorsed NOT performing exercises as he has been directed by SLP - 40 reps of each - effortful swallow and Mendelsohn. SLP again told pt that he would need to perform 40 reps of each of these per day at home to give him any opportunity to enjoy some kind of PO food/liquid safely. SLP explained that pt's prognosis was guarded but that doing exercises daily as SLP has directed will provide pt the opportunity for improvement - if pt does not perform exercises as directed then pt almost guaranteed to remain at the level he is at now or worsen. [name] was asked whether he wanted to continue working with SLP with dysphagia exercises and did not answer this question in ~10 seconds, so SLP encouraged pt to think about it, talk with family and friends about it and tell SLP his decision.      Assessment / Recommendations / Plan   Plan --   pt will tell SLP his desired plan     Dysphagia Recommendations   Diet recommendations NPO   with pleasure feeds of honey thick with precautions after thorough oral care   Medication Administration Via alternative means    Supervision Intermittent supervision to cue for compensatory strategies    Compensations Clear throat  after each swallow;Hard cough after swallow;Multiple dry swallows after each bite/sip   must do POs after thorough oral care     Progression Toward Goals   Progression toward goals Not progressing toward goals (comment)   suboptimal completion of HEP           SLP Education - 02/07/21 1012    Education Details see therapy note for today for details    Person(s) Educated Patient;Caregiver(s)    Methods Explanation;Demonstration;Verbal cues    Comprehension Verbalized understanding;Returned demonstration;Need further instruction;Verbal cues required            SLP Short Term Goals - 01/23/21 1320      SLP SHORT TERM GOAL #1   Title pt will demo correct procedure for HEP with rare min A, in 2 sessions    Baseline 01/02/21, 01-07-21    Period --   or 9 visits, for all STGs except STG #2   Status Achieved  SLP SHORT TERM GOAL #2   Title pt will tell SLP why he is completing HEP with rare min A    Baseline 12-26-20    Period --   or 5 total visits   Status Achieved      SLP SHORT TERM GOAL #3   Title pt will demo swallow precautions with occasional min A in two sessions    Status Deferred   pt now NPO     SLP SHORT TERM GOAL #4   Title pt will tell SLP 3 overt s/sx aspiration PNA in 2 sessions after SLP review of overt s/sx    Baseline 12-26-20, 01-07-21    Status Achieved      SLP SHORT TERM GOAL #5   Title pt will complete 10 effortful swallows in 60 seconds    Time 1    Period Weeks    Status On-going            SLP Long Term Goals - 02/07/21 [place] #1   Title pt will demo proper procedure for HEP with modified independence over 3 sessions    Baseline 01-09-21    Time 3    Period Weeks   or 17 visits, for all LTGs   Status On-going      SLP LONG TERM GOAL #2   Title pt will perform 10 reps Mendelsohn in 75 seconds    Time 3    Period Weeks    Status On-going      SLP LONG TERM GOAL #3   Title pt will demo aspiration precauations  with POs with occasional min A in 4 sessions total    Status Deferred   pt now NPO           Plan - 02/07/21 1013    Clinical Impression Statement [name] presents today with severe dysphagia after FEES at [facility], and MBSS yesterday at [facility] - sever to profound dysphagia continues with NPO for nutrition/hydration with pleasure feeds honey thick and water after thorough oral care with aspiration precautions mentioned in daily note ("other comments"). See those reports in "care everywhere" for more details. Pt cont to endorse suboptimal completion of HEP  Intensive swallow therapy was recommended after FEES, and after yesterday's MBSS. See "other comments" for more details of today's session. SLP will need to continue to follow to track proper completion of HEP and to assess safety with POs (pt's following of aspiration precautions). It was ascertained today that pt may not be completely willing to continue with ST at this time. Pt will make his decision and tell SLP what he prefers - possible d/c may ensue.    Speech Therapy Frequency 2x / week   once approx every 4 weeks   Duration 8 weeks   or 17 total sessions   Treatment/Interventions Aspiration precaution training;Pharyngeal strengthening exercises;Trials of upgraded texture/liquids;Diet toleration management by SLP;Internal/external aids;Patient/family education;SLP instruction and feedback;Environmental controls;Compensatory strategies    Potential to Achieve Goals Fair    Potential Considerations Previous level of function;Severity of impairments;Cooperation/participation level;Other (comment)   limited literacy   SLP Home Exercise Plan reviewed - Mendelsohn and Effortful swallow today    Consulted and Agree with Plan of Care Patient           Patient will benefit from skilled therapeutic intervention in order to improve the following deficits and impairments:   Pharyngeal dysphagia    Problem List Patient Active Problem  List   Diagnosis Date Noted  . Nausea and vomiting 01/24/2021  . Shortness of breath 01/10/2021  . Mucositis due to antineoplastic therapy 01/10/2021  . G tube feedings ([place]) 01/10/2021  . Chemotherapy-induced diarrhea 10/30/2020  . Port-A-Cath in place 10/23/2020  . Cancer of tonsillar fossa ([place]) 08/31/2020  . Laryngeal cancer ([place]) 01/06/2020  . Metastatic cancer to cervical lymph nodes ([place]) 12/01/2019  . Chronic hepatitis C without hepatic coma ([place]) 06/09/2019  . 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 Squamous cell carcinoma of neck 04/01/2019    [facility] 02/07/2021, 10:17 AM  [facility] [address] [place], [place], [address] Phone: [phone]   Fax:  [phone]   Name: [name] MRN: [id number] Date of Birth: 27-Oct-1950

## 2021-02-13 ENCOUNTER — Ambulatory Visit: Payer: Medicare HMO

## 2021-02-13 ENCOUNTER — Other Ambulatory Visit: Payer: Self-pay

## 2021-02-13 DIAGNOSIS — R1313 Dysphagia, pharyngeal phase: Secondary | ICD-10-CM

## 2021-02-14 ENCOUNTER — Inpatient Hospital Stay: Payer: Medicare HMO | Admitting: Hematology and Oncology

## 2021-02-15 ENCOUNTER — Inpatient Hospital Stay: Payer: Medicare HMO | Attending: Hematology and Oncology | Admitting: Dietician

## 2021-02-15 ENCOUNTER — Encounter: Payer: Self-pay | Admitting: Hematology and Oncology

## 2021-02-15 DIAGNOSIS — Z923 Personal history of irradiation: Secondary | ICD-10-CM | POA: Insufficient documentation

## 2021-02-15 DIAGNOSIS — R590 Localized enlarged lymph nodes: Secondary | ICD-10-CM | POA: Insufficient documentation

## 2021-02-15 DIAGNOSIS — Z931 Gastrostomy status: Secondary | ICD-10-CM | POA: Insufficient documentation

## 2021-02-15 DIAGNOSIS — C329 Malignant neoplasm of larynx, unspecified: Secondary | ICD-10-CM | POA: Insufficient documentation

## 2021-02-15 DIAGNOSIS — Z79899 Other long term (current) drug therapy: Secondary | ICD-10-CM | POA: Insufficient documentation

## 2021-02-15 DIAGNOSIS — Z5111 Encounter for antineoplastic chemotherapy: Secondary | ICD-10-CM | POA: Insufficient documentation

## 2021-02-15 DIAGNOSIS — K1231 Oral mucositis (ulcerative) due to antineoplastic therapy: Secondary | ICD-10-CM | POA: Insufficient documentation

## 2021-02-15 DIAGNOSIS — Z9221 Personal history of antineoplastic chemotherapy: Secondary | ICD-10-CM | POA: Insufficient documentation

## 2021-02-15 NOTE — Progress Notes (Signed)
Nutrition  Patient did not show for scheduled nutrition appointment. 

## 2021-02-19 ENCOUNTER — Ambulatory Visit: Payer: Medicare HMO | Attending: Radiation Oncology

## 2021-02-19 ENCOUNTER — Other Ambulatory Visit: Payer: Self-pay

## 2021-02-19 DIAGNOSIS — R1313 Dysphagia, pharyngeal phase: Secondary | ICD-10-CM | POA: Insufficient documentation

## 2021-02-19 NOTE — Therapy (Signed)
[facility] [address] [place], [place], [address] Phone: [phone]   Fax:  [phone]  Speech Language Pathology Treatment  Patient Details  Name: [name] MRN: [id number] Date of Birth: 07/05/1951 Referring Provider (SLP): [name], MD   Encounter Date: 02/19/2021   End of Session - 02/19/21 1240    Visit Number 11    Number of Visits 17    Date for SLP Re-Evaluation 03/13/21    Authorization Type humana    Authorization Time Period 02-27-21    Authorization - Visit Number 11    Authorization - Number of Visits 16    SLP Start Time 1153    SLP Stop Time  1225    SLP Time Calculation (min) 32 min    Activity Tolerance Patient tolerated treatment well           Past Medical History:  Diagnosis Date  . Cancer ([place])    Throat cancer 2019  . ETOH abuse   . Frequent urination   . Glaucoma   . Hepatitis C virus infection without hepatic coma    dx'ed in 11/2018  . History of radiation therapy 05/12/19- 07/06/19   Larynx  . Hypertension   . Wears denture    upper only; lost lower denture    Past Surgical History:  Procedure Laterality Date  . ANKLE SURGERY  2011   right ankle  . COLONOSCOPY  02/2019   polyps - Dr Havery Moros  . DIRECT LARYNGOSCOPY N/A 04/01/2019   Procedure: DIRECT LARYNGOSCOPY WITH BIOPSY;  Surgeon: Jodi Marble, MD;  Location: [facility];  Service: ENT;  Laterality: N/A;  . DIRECT LARYNGOSCOPY N/A 01/06/2020   Procedure: DIRECT LARYNGOSCOPY;  Surgeon: Izora Gala, MD;  Location: [facility];  Service: ENT;  Laterality: N/A;  . DIRECT LARYNGOSCOPY N/A 08/13/2020   Procedure: DIRECT LARYNGOSCOPY;  Surgeon: Izora Gala, MD;  Location: [facility];  Service: ENT;  Laterality: N/A;  . ESOPHAGOSCOPY N/A 04/01/2019   Procedure: ESOPHAGOSCOPY;  Surgeon: Jodi Marble, MD;  Location: [facility];  Service: ENT;  Laterality: N/A;  . EXCISION ORAL TUMOR Right 08/13/2020   Procedure:  BIOPSY OF OROPHARYNGEAL MASS;  Surgeon: Izora Gala, MD;  Location: [facility];  Service: ENT;  Laterality: Right;  . EYE SURGERY Right   . IR CM INJ ANY COLONIC TUBE W/FLUORO  12/20/2020  . IR GASTROSTOMY TUBE MOD SED  09/24/2020  . IR IMAGING GUIDED PORT INSERTION  09/24/2020  . KNEE SURGERY    . LARYNGOSCOPY AND BRONCHOSCOPY N/A 04/01/2019   Procedure: BRONCHOSCOPY;  Surgeon: Jodi Marble, MD;  Location: [facility];  Service: ENT;  Laterality: N/A;  . RADICAL NECK DISSECTION N/A 01/06/2020   Procedure: RADICAL NECK DISSECTION;  Surgeon: Izora Gala, MD;  Location: [facility];  Service: ENT;  Laterality: N/A;    There were no vitals filed for this visit.   Subjective Assessment - 02/19/21 1152    Subjective Pt was not in lobby at 1147. Entered lobby at 1148 from outdoors and once back into therapy area, used restroom until 1152.    Currently in Pain? No/denies                 ADULT SLP TREATMENT - 02/19/21 1154      General Information   Behavior/Cognition Pleasant mood;Lethargic;Requires cueing;Cooperative;Alert      Treatment Provided   Treatment provided Dysphagia      Dysphagia Treatment   Other treatment/comments [name]  wants to continue in [place] and vows he will do better with exercise frequency and rep numbers. He appears to have better management of secretions today than in previous sessions, as his voice is not as "gurgly" sounding as with previous sessions. Pt reported he completed thorough oral care <30 minutes prior to ST today, so SLP got water for pt. [name] reported to SLP that he was getting "about 30" reps in 4 of the last 6 days. SLP reiterated to pt 40 reps/day, each exercise, and 6-7 days/week is patient's current target. Pt recalled that SLP has given pt a sheet on which to track # reps/day and SLP encouraged pt to use this. Pt's first 4 boluses of icewater did not result in wet voice but pt's 5th bolus he seemed to have clear voice only after x7-8 throat  clears and reswallows after this presentation. Subsequent boluses of 1/16 teaspoon water had like results. By 4th rep of Mendelsohn pt beginning to show fatigue by reduced thyroid elevation and reduced time of thyroid hold, with 10 seconds between reps. For 6th rep, pt had delay of 7 seconds prior to swallow initiation. Using a wet spoon (dunk into water) for effortful swallow, pt with noted decr in swallow strength after rep #5 evidenced by reduced extent of thyroid elevation and more frequent wet sounding voice. Delay in swallow trigger observed after rep #8.      Dysphagia Recommendations   Diet recommendations NPO    Medication Administration Via alternative means    Supervision Intermittent supervision to cue for compensatory strategies    Compensations Clear throat after each swallow;Hard cough after swallow;Multiple dry swallows after each bite/sip      Progression Toward Goals   Progression toward goals Progressing toward goals            SLP Education - 02/19/21 1239    Education Details need 40 reps of each done at least 6 days a week    Person(s) Educated Patient    Methods Explanation    Comprehension Verbalized understanding            SLP Short Term Goals - 01/23/21 1320      SLP SHORT TERM GOAL #1   Title pt will demo correct procedure for HEP with rare min A, in 2 sessions    Baseline 01/02/21, 01-07-21    Period --   or 9 visits, for all STGs except STG #2   Status Achieved      SLP SHORT TERM GOAL #2   Title pt will tell SLP why he is completing HEP with rare min A    Baseline 12-26-20    Period --   or 5 total visits   Status Achieved      SLP SHORT TERM GOAL #3   Title pt will demo swallow precautions with occasional min A in two sessions    Status Deferred   pt now NPO     SLP SHORT TERM GOAL #4   Title pt will tell SLP 3 overt s/sx aspiration PNA in 2 sessions after SLP review of overt s/sx    Baseline 12-26-20, 01-07-21    Status Achieved      SLP  SHORT TERM GOAL #5   Title pt will complete 10 effortful swallows in 60 seconds    Time 1    Period Weeks    Status On-going            SLP Long Term Goals - 02/19/21 1244  SLP LONG TERM GOAL #1   Title pt will demo proper procedure for HEP with modified independence over 3 sessions    Baseline 01-09-21, 02-19-21    Time 2    Period Weeks   or 17 visits, for all LTGs   Status On-going      SLP LONG TERM GOAL #2   Title pt will perform 10 reps Mendelsohn in 75 seconds    Time 2    Period Weeks    Status On-going      SLP LONG TERM GOAL #3   Title pt will demo aspiration precauations with POs with occasional min A in 4 sessions total    Status Deferred   pt now NPO           Plan - 02/19/21 1240    Clinical Impression Statement [name] presents today with severe dysphagia after FEES at [facility] on 12-03-20, and MBSS at [facility] in mid-May2022- severe to profound dysphagia continues with NPO for nutrition/hydration with pleasure feeds honey thick and water after thorough oral care with aspiration precautions mentioned in daily note from 02-08-21. He wants to continue with ST.  Pt cont to endorse suboptimal completion of HEP but better than previous sessions ("about 30" reps each, 4/6 days since pt last seen), and pt with less secretions noted by less frequent throat clearing than in previous sessions.Intensive swallow therapy was recommended after FEES, and after pt's MBSS. See "other comments" for more details of today's session. SLP will need to continue to follow to track proper completion of HEP and to assess safety with POs (pt's following of aspiration precautions).    Speech Therapy Frequency 2x / week   once approx every 4 weeks   Duration 8 weeks   or 17 total sessions   Treatment/Interventions Aspiration precaution training;Pharyngeal strengthening exercises;Trials of upgraded texture/liquids;Diet toleration management by SLP;Internal/external aids;Patient/family education;SLP  instruction and feedback;Environmental controls;Compensatory strategies    Potential to Achieve Goals Fair    Potential Considerations Previous level of function;Severity of impairments;Cooperation/participation level;Other (comment)   limited literacy   SLP Home Exercise Plan reviewed - Mendelsohn and Effortful swallow today    Consulted and Agree with Plan of Care Patient           Patient will benefit from skilled therapeutic intervention in order to improve the following deficits and impairments:   Pharyngeal dysphagia    Problem List Patient Active Problem List   Diagnosis Date Noted  . Nausea and vomiting 01/24/2021  . Shortness of breath 01/10/2021  . Mucositis due to antineoplastic therapy 01/10/2021  . G tube feedings ([place]) 01/10/2021  . Chemotherapy-induced diarrhea 10/30/2020  . Port-A-Cath in place 10/23/2020  . Cancer of tonsillar fossa ([place]) 08/31/2020  . Laryngeal cancer ([place]) 01/06/2020  . Metastatic cancer to cervical lymph nodes ([place]) 12/01/2019  . Chronic hepatitis C without hepatic coma ([place]) 06/09/2019  . 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 Squamous cell carcinoma of neck 04/01/2019    [facility] ,[place], CCC-SLP  02/19/2021, 12:44 PM  [facility] [address] [place], [place], [address] Phone: [phone]   Fax:  [phone]   Name: [name] MRN: [id number] Date of Birth: 1950-12-04

## 2021-02-19 NOTE — Patient Instructions (Signed)
Aim for 40 reps of each exercise per day, at least 6 of the 7 days per week.  Clean your mouth out well no more than 30 minutes before you put honey-thick liquid, or water in your mouth

## 2021-02-21 ENCOUNTER — Other Ambulatory Visit: Payer: Self-pay

## 2021-02-21 ENCOUNTER — Encounter: Payer: Self-pay | Admitting: Hematology and Oncology

## 2021-02-21 ENCOUNTER — Inpatient Hospital Stay (HOSPITAL_BASED_OUTPATIENT_CLINIC_OR_DEPARTMENT_OTHER): Payer: Medicare HMO | Admitting: Hematology and Oncology

## 2021-02-21 VITALS — BP 112/83 | HR 80 | Temp 98.2°F | Resp 19 | Ht 68.0 in | Wt 116.4 lb

## 2021-02-21 DIAGNOSIS — Z931 Gastrostomy status: Secondary | ICD-10-CM | POA: Diagnosis not present

## 2021-02-21 DIAGNOSIS — C329 Malignant neoplasm of larynx, unspecified: Secondary | ICD-10-CM | POA: Diagnosis present

## 2021-02-21 DIAGNOSIS — K1231 Oral mucositis (ulcerative) due to antineoplastic therapy: Secondary | ICD-10-CM | POA: Diagnosis not present

## 2021-02-21 DIAGNOSIS — Z79899 Other long term (current) drug therapy: Secondary | ICD-10-CM | POA: Diagnosis not present

## 2021-02-21 DIAGNOSIS — C090 Malignant neoplasm of tonsillar fossa: Secondary | ICD-10-CM

## 2021-02-21 DIAGNOSIS — Z923 Personal history of irradiation: Secondary | ICD-10-CM | POA: Diagnosis not present

## 2021-02-21 DIAGNOSIS — Z5111 Encounter for antineoplastic chemotherapy: Secondary | ICD-10-CM | POA: Diagnosis not present

## 2021-02-21 DIAGNOSIS — R590 Localized enlarged lymph nodes: Secondary | ICD-10-CM | POA: Diagnosis not present

## 2021-02-21 DIAGNOSIS — Z9221 Personal history of antineoplastic chemotherapy: Secondary | ICD-10-CM | POA: Diagnosis not present

## 2021-02-21 DIAGNOSIS — C321 Malignant neoplasm of supraglottis: Secondary | ICD-10-CM | POA: Diagnosis not present

## 2021-02-21 MED ORDER — PROCHLORPERAZINE MALEATE 10 MG PO TABS: 10.0000 mg | ORAL_TABLET | Freq: Four times a day (QID) | ORAL | 1 refills | Status: DC | PRN

## 2021-02-21 MED ORDER — ONDANSETRON HCL 8 MG PO TABS: 8.0000 mg | ORAL_TABLET | Freq: Two times a day (BID) | ORAL | 1 refills | Status: DC | PRN

## 2021-02-21 MED ORDER — LIDOCAINE-PRILOCAINE 2.5-2.5 % EX CREA
TOPICAL_CREAM | CUTANEOUS | 3 refills | Status: DC
Start: 2021-02-21 — End: 2021-03-05

## 2021-02-21 MED ORDER — DEXAMETHASONE 4 MG PO TABS: 8.0000 mg | ORAL_TABLET | Freq: Two times a day (BID) | ORAL | 1 refills | Status: DC

## 2021-02-21 NOTE — Assessment & Plan Note (Signed)
G tube dependence. Weight change of about 3 lbs since past month Will recommend to stay in touch with our nutrition team.

## 2021-02-21 NOTE — Assessment & Plan Note (Addendum)
This is a very 61 pleasant male patient with laryngeal cancer s/p previous definitive RT in 2020 and most recently had concurrent CRT completed 10/2020 He had end of treatment PET scan which showed bilateral lymphadenopathy which is concerning for residual disease. He is unfortunately not a candidate for any more radiation and surgery. He was recommended palliative chemotherapy or immunotherapy He is PDL1 neg, hence he may not benefit from [place] Also given his fraility, baseline PS, we will consider single agent chemotherapy, preferably non platinum like docetaxel If he does tolerate it well, then I can consider doublet such as carbotaxol. I have discussed about adverse effects of docetaxel including but not limited to fatigue, nausea, vomiting, cytopenias, neuropathy, fluid retention, alopecia etc. He understands that some of the side effects of chemo can be fatal. He is willing to try some more treatment. If his PS deteriorates and he has disease progression in the future, I have recommended that we re discuss goals of care at this time. Chemotherapy plan placed, he already has a port in place. Will plan repeat imaging after 2 cycles of chemotherapy.

## 2021-02-21 NOTE — Progress Notes (Signed)
[name] FOLLOW UP NOTE  Patient Care Team: Alan Ebbs, MD as PCP - General (Internal Medicine) Alan Gala, MD as Consulting Physician (Otolaryngology) Alan Gibson, MD as Attending Physician (Radiation Oncology) Alan Sauers, RN (Inactive) as Oncology Nurse Navigator [name], RD as Dietitian (Nutrition) [name], CCC-SLP as Speech Language Pathologist (Speech Pathology) Alan Center, LCSW as Social Worker Alan Pike, MD as Consulting Physician (Hematology and Oncology) [name], Alan Police, RN as Oncology Nurse Navigator  CHIEF COMPLAINTS/PURPOSE OF CONSULTATION:  Follow up to discuss imaging results and treatment recommendations.  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 He is unfortunately not a candidate for any more radiation and surgery. He was recommended palliative chemotherapy or immunotherapy He is PDL1 neg, hence he may not benefit from [place] Also given his fraility, baseline PS, we will consider single agent chemotherapy, preferably non platinum like docetaxel If he does tolerate it well, then I can consider doublet such as carbotaxol. I have discussed about adverse effects of docetaxel including but not limited to fatigue, nausea, vomiting, cytopenias, neuropathy, fluid retention, alopecia etc. He understands that some of the side effects of chemo can be fatal. He is willing to try some more treatment. If his PS deteriorates and he has disease progression in the future, I have recommended that we re discuss goals of care at this time. Chemotherapy plan placed, he already has a port in place. Will plan repeat imaging after  2 cycles of chemotherapy.  G tube feedings (HCC) G tube dependence. Weight change of about 3 lbs since past month Will recommend to stay in touch with our nutrition team.  No orders of the defined types were placed in this encounter.    HISTORY OF PRESENTING ILLNESS:  [name] 70 y.o. male is here for follow up after his most recent PET CT  Oncology History Overview Note    [name] is a 70 y.o. male who has been treated for laryngeal cancer in the past.   Aug 2020, Pt had presented with dysphagia and difficulty breathing through his mouth, 20 lb weight loss. He saw Dr. Erik Adams who performed laryngoscopy and appreciated a 2 to 3 cm ovoid pedunculated mass arising from the right aryepiglottic fold and impinging on the supraglottic airway.  The vocal cords appeared to be mobile.  Urgent panendoscopy and biopsy were recommended to protect his airway.   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 According to his op note, a bulky necrotic and semi-pedunculated tumor coming off the lateral surface of the right aryepiglottic fold and the anterior and lateral aspect of the piriform sinus on the right side.  Airway was compromised by the tumor mass at intubation.   CT scan of the neck on 04/11/2019 showed glottic closure with no asymmetry of the cords, correlate with laryngoscopy results.  Questionable right level 2 lymph node measured 7 mm.  No definite pathologic lymph nodes in the neck.   PET scan on 04/22/2019 revealed mild residual activity in the posterior right hypopharynx confined to the mucosa with no evidence of metastatic disease to the neck or distantly.  He underwent definitive  radiotherapy to T2N0 laryngeal cancer  since he refused surgical treatment.  He then had PET imaging which showed a right neck lymph node, suspicious on PET. Biopsy of right neck lymph node is positive for recurrence.  He underwent right modified radical neck dissection including levels 2 and 3, including the internal jugular vein, suture marks the inferior jugular vein stump.  He most recently underwent a soft tissue neck CT scan on the date of 07/05/2020 that revealed asymmetric soft tissue thickening and enhancement along the right lateral pharynx. It also showed suspicious small 6 mm hyperenhancing nodular soft tissue along the posterior margin of the neck dissect at right level 3. Additionally, there was a mild inflammatory appearing right upper lobe centrilobular ground-glass opacity that was new (consider mild or developing upper lobe infection), post radiation changes to the lung apices, and sequelae of radiation and right neck dissection from previous neck CT.   Subsequently, the patient underwent a direct laryngoscopy and biopsy of oropharyngeal mass on the date of 08/13/2020. Pathology from the procedure revealed poorly differentiated squamous cell carcinoma.  PET showed recurrence of head and neck cancer with broad hypermetabolic pharyngeal mucosal lesion involving right lateral posterior oropharynx and hypopharynx extending from palatine tonsil to the vallecula.Hypermetabolic lymph node posterior sternocleidomastoid muscle on the RIGHT (level 3).Hypermetabolic nodule within the LEFT parotid glands favored small primary parotid neoplasm.  Recommendation was to consider proceeding with upfront concurrent chemo radiation followed by salvage surgery if needed. He completed 6 weekly cycles of chemotherapy and concurrent radiation, 10/2020.  PET scan 01/31/2021 with worsening nodal disease in the left neck and a right supraclavicular LN as discussed. Post treatment changes with soft tissue fullness in the area of the prior tumor but with markedly diminished metabolic activity.     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 SEE NOTE   04/11/2019 Imaging   CT neck: IMPRESSION: The glottis is closed with suboptimal evaluation of the vocal cords. No asymmetry of the cords. Correlate with laryngoscopy results.   No enlarged lymph nodes in the neck. 7 mm right level 2 lymph node may be reactive. No definite pathologic lymph nodes in the neck.   04/22/2019 Imaging   PET: IMPRESSION: 1. Mild residual activity in the posterior RIGHT hypopharynx confined to the mucosa. 2. No evidence of hypermetabolic metastatic lymph nodes in LEFT or RIGHT neck. 3. No evidence distant metastatic disease.   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 He cant quite speak because of hoarseness and pain in his throat. He was told the PET CT results that showed bilateral lymphadenopathy concerning for disease progression.  He continues to have pain in his throat, ongoing nausea with intermittent vomiting. No change in breathing Nourishment is mostly through G tube, Rest of the pertinent 10 point ROS reviewed and negative.   MEDICAL HISTORY:  Past Medical History:  Diagnosis Date   Cancer ([place])    Throat cancer 2019   ETOH abuse    Frequent urination     Glaucoma    Hepatitis C virus infection without hepatic coma    dx'ed in 11/2018   History of radiation therapy 05/12/19- 07/06/19   Larynx   Hypertension    Wears denture    upper only; lost lower denture    SURGICAL HISTORY: Past Surgical History:  Procedure Laterality Date   ANKLE SURGERY  2011   right ankle   COLONOSCOPY  02/2019   polyps - Dr Havery Moros   DIRECT LARYNGOSCOPY N/A 04/01/2019   Procedure: DIRECT LARYNGOSCOPY WITH BIOPSY;  Surgeon: Jodi Marble, MD;  Location: [facility];  Service: ENT;  Laterality: N/A;   DIRECT LARYNGOSCOPY N/A 01/06/2020   Procedure: DIRECT LARYNGOSCOPY;  Surgeon: Alan Gala, MD;  Location: [facility];  Service: ENT;  Laterality: N/A;   DIRECT LARYNGOSCOPY N/A 08/13/2020   Procedure: DIRECT LARYNGOSCOPY;  Surgeon: Alan Gala, MD;  Location: [facility];  Service: ENT;  Laterality: N/A;   ESOPHAGOSCOPY N/A 04/01/2019   Procedure: ESOPHAGOSCOPY;  Surgeon: Jodi Marble, MD;  Location: [facility];  Service: ENT;  Laterality: N/A;   EXCISION ORAL TUMOR Right 08/13/2020   Procedure: BIOPSY OF OROPHARYNGEAL MASS;  Surgeon: Alan Gala, MD;  Location: [facility];  Service: ENT;  Laterality: Right;   EYE SURGERY Right    IR CM INJ ANY COLONIC TUBE W/FLUORO  12/20/2020   IR GASTROSTOMY TUBE MOD SED  09/24/2020   IR IMAGING GUIDED PORT INSERTION  09/24/2020   KNEE SURGERY     LARYNGOSCOPY AND BRONCHOSCOPY N/A 04/01/2019   Procedure: BRONCHOSCOPY;  Surgeon: Jodi Marble, MD;  Location: [facility];  Service: ENT;  Laterality: N/A;   RADICAL NECK DISSECTION N/A 01/06/2020   Procedure: RADICAL NECK DISSECTION;  Surgeon: Alan Gala, MD;  Location: [facility];  Service: ENT;  Laterality: N/A;    SOCIAL HISTORY: Social History   Socioeconomic History   Marital status: Single    Spouse name: Not on file   Number of children: 2   Years of education: Not on file   Highest education level: Not on file  Occupational History   Not on file   Tobacco Use   Smoking status: Former    Years: 50.00    Pack years: 0.00    Types: Cigarettes    Quit date: 06/01/2020    Years since quitting: 0.7   Smokeless tobacco: Never  Vaping Use   Vaping Use: Never used  Substance and Sexual Activity   Alcohol use: Yes    Alcohol/week: 4.0 standard drinks    Types: 4 Cans of beer per week    Comment: 40oz beer daily   Drug use: Yes    Types: Cocaine    Comment: none in 2 yrs   Sexual activity: Yes    Partners: Female  Other Topics Concern   Not on file  Social History Narrative   Patient is divorced with 2 children.   Patient is currently living with his sister.   Patient with a history of smoking a third of pack of cigarettes daily for 50 years.  Patient currently smoking 2 to 3 cigarettes/day.  Patient has never used smokeless tobacco.   Patient with occasional use of alcohol.   Patient last used cocaine approximately 6 months ago.  Patient denies use of marijuana.   Social Determinants of Health   Financial Resource Strain: Not on file  Food Insecurity: No Food Insecurity   Worried About Charity fundraiser in the Last Year: Never true   Ran Out of Food in the Last Year: Never true  Transportation Needs: No Transportation Needs   Lack of Transportation (Medical): No   Lack of Transportation (Non-Medical): No  Physical Activity: Not on file  Stress: Not on file  Social Connections: Unknown   Frequency of Communication with Friends and Family: Three times a week   Frequency of Social Gatherings with Friends and Family: More than three times a week   Attends Religious Services: Not on file   Active Member of Clubs or Organizations: Not on file   Attends Archivist Meetings: Not on file   Marital Status: Not on file  Intimate Partner Violence: Not on file    FAMILY HISTORY: Family History  Problem Relation Age of Onset   Breast cancer Sister    Colon cancer Brother 53       ????   Cancer Brother      ALLERGIES:  has No Known Allergies.  MEDICATIONS:  Current Outpatient Medications  Medication Sig Dispense Refill   atropine 1 % ophthalmic solution Place 1 drop into the right eye 2 (two) times a day.      cetirizine (ZYRTEC) 10 MG tablet Take 10 mg by mouth daily as needed for allergies.     dexamethasone (DECADRON) 4 MG tablet Take 2 tablets (8 mg total) by mouth daily. Take daily x 3 days starting the day after cisplatin chemotherapy. Take with food. 30 tablet 1   docusate sodium (COLACE) 50 MG capsule Take 1 capsule (50 mg total) by mouth 2 (two) times daily. 10 capsule 0   fluconazole (DIFLUCAN) 200 MG tablet Take 1 tablet (200 mg total) by mouth daily. 1 tablet 0   HYDROcodone-acetaminophen (HYCET) 7.5-325 mg/15 ml solution Take 10 mLs by mouth 4 (four) times daily as needed for moderate pain (Cancer pain). 473 mL 0   ibuprofen (ADVIL) 200 MG tablet Take 400 mg by mouth every 6 (six) hours as needed for headache or moderate pain.     latanoprost (XALATAN) 0.005 % ophthalmic solution Place 1 drop into the right eye at bedtime.      lidocaine (XYLOCAINE) 2 % solution Patient: Mix 1part 2% viscous lidocaine, 1part H20. Swish AND swallow 66m of diluted mixture, 317m before meals and at bedtime, up to QID 200 mL 3   lidocaine-prilocaine (EMLA) cream Apply to affected area once (Patient taking differently: Apply 1 application topically daily as needed (port access). Apply to affected area once) 30 g 3   LORazepam (ATIVAN) 0.5 MG tablet Take 1 tablet (0.5 mg total) by mouth every 6 (six) hours as needed (Nausea or vomiting). 30 tablet 0   losartan (COZAAR) 50 MG tablet Take 50 mg by mouth daily.     Nutritional Supplements (FEEDING SUPPLEMENT, OSMOLITE 1.5 CAL,) LIQD Give 1 carton Osmolite 1.5 via PEG 4 times daily with 90 mL free water before and after bolus feeding.  Increase to 1-1/2 cartons 4 times daily.  In addition, drink or flush tube with an additional 240 mL free water twice daily.   This provides 2130 cal, 89.4 g protein and 2352  mL free water which is 100% estimated needs. 1422 mL 0   ondansetron (ZOFRAN) 8 MG tablet Take 1 tablet (8 mg total) by mouth 2 (two) times daily as needed. Start on the third day after cisplatin chemotherapy. 30 tablet 1   prednisoLONE acetate (PRED FORTE) 1 % ophthalmic suspension Place 1 drop into both eyes daily.     prochlorperazine (COMPAZINE) 10 MG tablet Take 1 tablet (10 mg total) by mouth every 6 (six) hours as needed (Nausea or vomiting). 30 tablet 1   tolterodine (DETROL LA) 4 MG 24 hr capsule Take 4 mg by mouth daily.     Vitamin D, Ergocalciferol, (DRISDOL) 1.25 MG (50000 UNIT) CAPS capsule Take 50,000 Units by mouth once a week.     No current facility-administered medications for this visit.    PHYSICAL EXAMINATION:   ECOG PERFORMANCE STATUS: 0 - Asymptomatic  Vitals:   02/21/21 1452  BP: 112/83  Pulse: 80  Resp: 19  Temp: 98.2 F (36.8 C)  SpO2: 100%   Filed Weights   02/21/21 1452  Weight: 116 lb 6.4 oz (52.8 kg)   Physical Exam Constitutional:      General: He is not in acute distress.    Comments: Frail   HENT:     Head: Normocephalic.     Mouth/Throat:     Mouth: Mucous membranes are moist.     Pharynx: No oropharyngeal exudate or posterior oropharyngeal erythema.  Cardiovascular:     Rate and Rhythm: Normal rate and regular rhythm.  Pulmonary:     Effort: Pulmonary effort is normal.  Abdominal:     General: Abdomen is flat.     Palpations: Abdomen is soft.     Comments: G tube with some yellow drainage from around the tube, no clear evidence of infection   Musculoskeletal:        General: No swelling or tenderness.     Cervical back: Normal range of motion and neck supple. No rigidity.  Lymphadenopathy:     Cervical: No cervical adenopathy (I couldnt palpate residual lymphadenopathy. His neck skin is very tight from previous radiation).  Skin:    General: Skin is warm and dry.  Neurological:      General: No focal deficit present.     Mental Status: He is alert.  Psychiatric:        Mood and Affect: Mood normal.    LABORATORY DATA:  I have reviewed the data as listed Lab Results  Component Value Date   WBC 3.1 (L) 01/10/2021   HGB 11.2 (L) 01/10/2021   HCT 33.8 (L) 01/10/2021   MCV 96.3 01/10/2021   PLT 199 01/10/2021     Chemistry      Component Value Date/Time   NA 140 01/10/2021 0921   K 4.3 01/10/2021 0921   CL 101 01/10/2021 0921   CO2 29 01/10/2021 0921   BUN 8 01/10/2021 0921   CREATININE 0.67 01/10/2021 0921   CREATININE 0.61 (L) 10/26/2019 1523      Component Value Date/Time   CALCIUM 9.6 01/10/2021 0921   ALKPHOS 60 01/10/2021 0921   AST 15 01/10/2021 0921   ALT 6 01/10/2021 0921   ALT 53 (H) 09/06/2019 1543   BILITOT 0.4 01/10/2021 0921       RADIOGRAPHIC STUDIES: I have personally reviewed the radiological images as listed and agreed with the findings in the report. NM PET Image Restag (PS) Skull Base To Thigh  Result Date: 02/01/2021 CLINICAL DATA:  Subsequent treatment strategy for head neck cancer. EXAM: NUCLEAR MEDICINE PET SKULL BASE TO THIGH TECHNIQUE: 5.9 mCi F-18 FDG was injected intravenously. Full-ring PET imaging was performed from the skull base to thigh after the radiotracer. CT data was obtained and used for attenuation correction and anatomic localization. Fasting blood glucose: 96 mg/dl COMPARISON:  Multiple prior studies most recent PET from December of 2021. FINDINGS: Mediastinal blood pool activity: SUV max 2.35 Liver activity: SUV max NA NECK: Marked loss of normal tissue planes throughout the neck due to post treatment changes. Compared to prior imaging there is near complete resolution of hypermetabolic activity across the mucosal surfaces of the oropharynx. Fullness of soft tissue remains without the degree of metabolism that was seen previously. Maximum SUV for instance at the base of the RIGHT tongue extending towards the  lateral mucosal surfaces measures 4 with respect to maximum SUV. Previously as much as 10.6. There is however an 11 mm lymph node, short axis measurement on image 31 of series 4 that shows marked increased metabolism with a maximum SUV of 8.4 at LEFT level 2. No additional areas of increased metabolism to suggest persistent disease in the neck, low level activity throughout the treated area as discussed above. Incidental CT findings: none CHEST: RIGHT thoracic inlet just above the RIGHT clavicle (image 56 of series 4) with a hypermetabolic lymph node in this area measuring 12 mm short axis maximum SUV of 6.8 no additional hypermetabolic lymph nodes in the chest. No suspicious pulmonary nodules. Incidental CT findings: LEFT Port-A-Cath terminates at the caval to atrial junction. Normal heart size. Signs of coronary artery disease. No substantial pericardial effusion. Normal caliber of pulmonary vasculature. Scarring in the lingula. ABDOMEN/PELVIS: No abnormal hypermetabolic activity within the liver, pancreas, adrenal glands, or spleen. No hypermetabolic lymph nodes in the abdomen or pelvis. Incidental CT findings: Sludge in the gallbladder. No acute findings relative to liver, gallbladder, spleen, pancreas, adrenal glands and kidneys. Urinary bladder under distended. G-tube in place. No acute gastrointestinal findings. Colonic diverticulosis. Grossly unremarkable appearance of reproductive structures. Aortic atherosclerosis without aneurysm. SKELETON: No focal hypermetabolic activity to suggest skeletal metastasis. Incidental CT findings: none IMPRESSION: Findings of worsening nodal disease with a node or group of nodes in the LEFT neck and a RIGHT supraclavicular lymph node as discussed. Post treatment changes with soft tissue fullness in the area of the prior tumor but with markedly diminished metabolic activity. Electronically Signed   By: Zetta Bills M.D.   On: 02/01/2021 15:41    PDL1 negative < 1%  All  questions were answered. The patient knows to call the clinic with any problems, questions or concerns.      Alan Pike, MD 02/21/2021 7:47 PM

## 2021-02-21 NOTE — Progress Notes (Signed)
DISCONTINUE ON PATHWAY REGIMEN - Head and Neck     A cycle is every 7 days:     Cisplatin   **Always confirm dose/schedule in your pharmacy ordering system**  REASON: Disease Progression PRIOR TREATMENT: [id number]: Cisplatin 40 mg/m2 q7 Days with Concurrent Radiation TREATMENT RESPONSE: Progressive Disease (PD)  START OFF PATHWAY REGIMEN - Head and Neck   OFF00006:Docetaxel (100 mg/m2):   A cycle is every 21 days:     Docetaxel   **Always confirm dose/schedule in your pharmacy ordering system**  Patient Characteristics: Larynx, Local Recurrence, Not a Candidate for Radiation Therapy, Prior Platinum-Based Chemoradiation within 6 Months, Not a Candidate for Checkpoint Inhibitor Disease Classification: Larynx AJCC T Category: T2 AJCC 8 Stage Grouping: Unknown Therapeutic Status: Local Recurrence AJCC N Category: NX AJCC M Category: M0 Is patient a candidate for radiation therapy<= No Prior Platinum Status: Prior Platinum-Based Chemoradiation within 6 Months Checkpoint Inhibitor Candidacy: Not a Candidate for Checkpoint Inhibitor Intent of Therapy: Non-Curative / Palliative Intent, Discussed with Patient

## 2021-02-22 ENCOUNTER — Other Ambulatory Visit: Payer: Medicaid Other | Admitting: Student

## 2021-02-25 ENCOUNTER — Telehealth: Payer: Self-pay | Admitting: Hematology and Oncology

## 2021-02-25 NOTE — Telephone Encounter (Signed)
Scheduled per 06/09 los, patient has been called and notified of upcoming appointments.

## 2021-02-26 ENCOUNTER — Telehealth: Payer: Self-pay | Admitting: Student

## 2021-02-26 NOTE — Progress Notes (Signed)
Pharmacist Chemotherapy Monitoring - Initial Assessment    Anticipated start date: 03/05/21   Regimen:  Are orders appropriate based on the patient's diagnosis, regimen, and cycle? Yes Does the plan date match the patient's scheduled date? Yes Is the sequencing of drugs appropriate? Yes Are the premedications appropriate for the patient's regimen? Yes Prior Authorization for treatment is: Not Started If applicable, is the correct biosimilar selected based on the patient's insurance? not applicable  Organ Function and Labs: Are dose adjustments needed based on the patient's renal function, hepatic function, or hematologic function? Yes Are appropriate labs ordered prior to the start of patient's treatment? Yes Other organ system assessment, if indicated: N/A The following baseline labs, if indicated, have been ordered: N/A  Dose Assessment: Are the drug doses appropriate? Yes Are the following correct: Drug concentrations Yes IV fluid compatible with drug Yes Administration routes Yes Timing of therapy Yes If applicable, does the patient have documented access for treatment and/or plans for port-a-cath placement? yes If applicable, have lifetime cumulative doses been properly documented and assessed? yes Lifetime Dose Tracking  No doses have been documented on this patient for the following tracked chemicals: Doxorubicin, Epirubicin, Idarubicin, Daunorubicin, Mitoxantrone, Bleomycin, Oxaliplatin, Carboplatin, Liposomal Doxorubicin    Toxicity Monitoring/Prevention: The patient has the following take home antiemetics prescribed: Ondansetron, Prochlorperazine, and Dexamethasone The patient has the following take home medications prescribed: N/A Medication allergies and previous infusion related reactions, if applicable, have been reviewed and addressed. No The patient's current medication list has been assessed for drug-drug interactions with their chemotherapy regimen. no significant  drug-drug interactions were identified on review.  Order Review: Are the treatment plan orders signed? No Is the patient scheduled to see a provider prior to their treatment? Yes  I verify that I have reviewed each item in the above checklist and answered each question accordingly.  Larene Beach [facility], 02/26/2021  12:23 PM y

## 2021-02-26 NOTE — Telephone Encounter (Signed)
Called patient's sister, [name], to offer to reschedule the Palliative Consult, no answer  - left message with my name and call back number requesting a return call.

## 2021-02-27 ENCOUNTER — Ambulatory Visit (INDEPENDENT_AMBULATORY_CARE_PROVIDER_SITE_OTHER): Payer: Medicare HMO | Admitting: Physician Assistant

## 2021-02-27 ENCOUNTER — Encounter: Payer: Self-pay | Admitting: Physician Assistant

## 2021-02-27 VITALS — BP 118/90 | HR 95 | Ht 68.0 in | Wt 112.0 lb

## 2021-02-27 DIAGNOSIS — Z931 Gastrostomy status: Secondary | ICD-10-CM

## 2021-02-27 DIAGNOSIS — R112 Nausea with vomiting, unspecified: Secondary | ICD-10-CM | POA: Diagnosis not present

## 2021-02-27 DIAGNOSIS — R197 Diarrhea, unspecified: Secondary | ICD-10-CM | POA: Diagnosis not present

## 2021-02-27 DIAGNOSIS — C329 Malignant neoplasm of larynx, unspecified: Secondary | ICD-10-CM

## 2021-02-27 NOTE — Progress Notes (Signed)
Agree with assessment and plan as outlined.  

## 2021-02-27 NOTE — Progress Notes (Signed)
Chief Complaint: Nausea and vomiting  HPI:    [name] is a 70 year old African-American male with a past medical history as listed below including hepatitis C, alcohol abuse, laryngeal cancer with previous chemo/radiation currently on chemotherapy and multiple others, who was referred to me by [name], MD for a complaint of nausea and vomiting.      03/24/2019 virtual office visit with Dr. Havery Moros for sore throat.  That time is noted patient had been referred for colon cancer screening in the light of his brother diagnosis of colon cancer in his 67s prior.  He underwent a colonoscopy in June 2020 with 1 small adenoma.  Repeat recommended in 7 years.  Also noted to have elevated liver enzymes in the setting of significant alcohol use.  Imaging initially showed ultrasound showed a fatty liver with suggestion of cirrhosis.  He was positive for genotype Ia hepatitis C and referred to ID.  He had an elastography done which showed F0-F1 fibrosis and minimal risk for cirrhosis.  His main complaint that time a sore throat for a few months.  At times discussed that he warranted a laryngoscopy based on symptoms.  It seem less likely he had esophageal issues however as discussed he could have an EGD if needed.    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  He was diagnosed in 2020 and has had chemo and radiation.  I discussed he had a recent PET scan which showed bilateral lymphadenopathy which is concerning for regional disease.  Unfortunately was not a candidate for any more radiation or surgery.  Is recommended he have palliative chemotherapy or immunotherapy.  He is also had a G-tube placed in the interim.  It was noted that he was having hoarseness and pain in his throat and ongoing nausea with intermittent vomiting.  Nourishment was mostly through his G-tube.    Today, the patient presents to clinic accompanied by a family member and tells  me that he has been having ongoing problems with nausea and vomiting.  Apparently he was on some medicine that was not working for him.  Recently his oncologist sent in for Zofran 8 mg and Compazine 10 mg.  They have not started using this medicine yet.  Patient tells me that he will typically have nausea at some point every day and it is worse in the evenings and often in the mornings he will have vomiting.  Denies accompanying abdominal pain.  Does get most of his feeds through G-tube.    Also tells me today will have 3-4 loose stools a day.  No incontinence, but occasional nighttime awakenings.    Denies fever, chills or blood in the stool.  GI imaging/procedures: 08/13/2020 direct laryngoscopy and biopsy of oropharyngeal mass-squamous cell carcinoma  PET scan 04/22/2019 revealed mild residual activity in the posterior right hypopharynx confined to the mucosa with no evidence of metastatic disease to the neck or distantly  CT scan of the neck 04/11/2019 showed glottic closure with no asymmetry of the cords, questionable right level 2 lymph node measured 7 mm  [place] 02/16/19 - irregular liver, possible early cirrhosis, otherwise normal [place] with Elastography 03/22/19 - fibrosis score F0-F1, mild hepatic steatosis,   Ferritin 581, iron 262, sat 64.8%, hemochromatosis genetic testing negative   Colonoscopy 03/07/19 - The terminal ileum appeared normal. 20mm adenoma, diverticulosis, hemorrhoids, normal colon otherwise.     Past Medical History:  Diagnosis Date   Cancer ([place])  Throat cancer 2019   ETOH abuse    Frequent urination    Glaucoma    Hepatitis C virus infection without hepatic coma    dx'ed in 11/2018   History of radiation therapy 05/12/19- 07/06/19   Larynx   Hypertension    Wears denture    upper only; lost lower denture    Past Surgical History:  Procedure Laterality Date   ANKLE SURGERY  2011   right ankle   COLONOSCOPY  02/2019   polyps - Dr Havery Moros   DIRECT LARYNGOSCOPY  N/A 04/01/2019   Procedure: DIRECT LARYNGOSCOPY WITH BIOPSY;  Surgeon: Jodi Marble, MD;  Location: [place];  Service: ENT;  Laterality: N/A;   DIRECT LARYNGOSCOPY N/A 01/06/2020   Procedure: DIRECT LARYNGOSCOPY;  Surgeon: Izora Gala, MD;  Location: [place];  Service: ENT;  Laterality: N/A;   DIRECT LARYNGOSCOPY N/A 08/13/2020   Procedure: DIRECT LARYNGOSCOPY;  Surgeon: Izora Gala, MD;  Location: [place];  Service: ENT;  Laterality: N/A;   ESOPHAGOSCOPY N/A 04/01/2019   Procedure: ESOPHAGOSCOPY;  Surgeon: Jodi Marble, MD;  Location: [place];  Service: ENT;  Laterality: N/A;   EXCISION ORAL TUMOR Right 08/13/2020   Procedure: BIOPSY OF OROPHARYNGEAL MASS;  Surgeon: Izora Gala, MD;  Location: [place];  Service: ENT;  Laterality: Right;   EYE SURGERY Right    IR CM INJ ANY COLONIC TUBE W/FLUORO  12/20/2020   IR GASTROSTOMY TUBE MOD SED  09/24/2020   IR IMAGING GUIDED PORT INSERTION  09/24/2020   KNEE SURGERY     LARYNGOSCOPY AND BRONCHOSCOPY N/A 04/01/2019   Procedure: BRONCHOSCOPY;  Surgeon: Jodi Marble, MD;  Location: [place];  Service: ENT;  Laterality: N/A;   RADICAL NECK DISSECTION N/A 01/06/2020   Procedure: RADICAL NECK DISSECTION;  Surgeon: Izora Gala, MD;  Location: [place];  Service: ENT;  Laterality: N/A;    Current Outpatient Medications  Medication Sig Dispense Refill   atropine 1 % ophthalmic solution Place 1 drop into the right eye 2 (two) times a day.      cetirizine (ZYRTEC) 10 MG tablet Take 10 mg by mouth daily as needed for allergies.     dexamethasone (DECADRON) 4 MG tablet Take 2 tablets (8 mg total) by mouth 2 (two) times daily. Start the day before Taxotere. Then daily after chemo for 2 days. 30 tablet 1   docusate sodium (COLACE) 50 MG capsule Take 1 capsule (50 mg total) by mouth 2 (two) times daily. 10 capsule 0   fluconazole (DIFLUCAN) 200 MG tablet Take 1 tablet (200 mg total) by mouth daily. 1 tablet 0   HYDROcodone-acetaminophen  (HYCET) 7.5-325 mg/15 ml solution Take 10 mLs by mouth 4 (four) times daily as needed for moderate pain (Cancer pain). 473 mL 0   ibuprofen (ADVIL) 200 MG tablet Take 400 mg by mouth every 6 (six) hours as needed for headache or moderate pain.     latanoprost (XALATAN) 0.005 % ophthalmic solution Place 1 drop into the right eye at bedtime.      lidocaine-prilocaine (EMLA) cream Apply to affected area once 30 g 3   losartan (COZAAR) 50 MG tablet Take 50 mg by mouth daily.     Nutritional Supplements (FEEDING SUPPLEMENT, OSMOLITE 1.5 CAL,) LIQD Give 1 carton Osmolite 1.5 via PEG 4 times daily with 90 mL free water before and after bolus feeding.  Increase to 1-1/2 cartons 4 times daily.  In addition, drink or flush tube with an additional  240 mL free water twice daily.  This provides 2130 cal, 89.4 g protein and 2352 mL free water which is 100% estimated needs. 1422 mL 0   ondansetron (ZOFRAN) 8 MG tablet Take 1 tablet (8 mg total) by mouth 2 (two) times daily as needed for refractory nausea / vomiting. 30 tablet 1   prednisoLONE acetate (PRED FORTE) 1 % ophthalmic suspension Place 1 drop into both eyes daily.     prochlorperazine (COMPAZINE) 10 MG tablet Take 1 tablet (10 mg total) by mouth every 6 (six) hours as needed (Nausea or vomiting). 30 tablet 1   tolterodine (DETROL LA) 4 MG 24 hr capsule Take 4 mg by mouth daily.     Vitamin D, Ergocalciferol, (DRISDOL) 1.25 MG (50000 UNIT) CAPS capsule Take 50,000 Units by mouth once a week.     No current facility-administered medications for this visit.    Allergies as of 02/27/2021   (No Known Allergies)    Family History  Problem Relation Age of Onset   Breast cancer Sister    Colon cancer Brother 75       ????   Cancer Brother     Social History   Socioeconomic History   Marital status: Single    Spouse name: Not on file   Number of children: 2   Years of education: Not on file   Highest education level: Not on file  Occupational  History   Not on file  Tobacco Use   Smoking status: Former    Years: 50.00    Pack years: 0.00    Types: Cigarettes    Quit date: 06/01/2020    Years since quitting: 0.7   Smokeless tobacco: Never  Vaping Use   Vaping Use: Never used  Substance and Sexual Activity   Alcohol use: Yes    Alcohol/week: 4.0 standard drinks    Types: 4 Cans of beer per week    Comment: 40oz beer daily   Drug use: Yes    Types: Cocaine    Comment: none in 2 yrs   Sexual activity: Yes    Partners: Female  Other Topics Concern   Not on file  Social History Narrative   Patient is divorced with 2 children.   Patient is currently living with his sister.   Patient with a history of smoking a third of pack of cigarettes daily for 50 years.  Patient currently smoking 2 to 3 cigarettes/day.   Patient has never used smokeless tobacco.   Patient with occasional use of alcohol.   Patient last used cocaine approximately 6 months ago.  Patient denies use of marijuana.   Social Determinants of Health   Financial Resource Strain: Not on file  Food Insecurity: No Food Insecurity   Worried About Charity fundraiser in the Last Year: Never true   Ran Out of Food in the Last Year: Never true  Transportation Needs: No Transportation Needs   Lack of Transportation (Medical): No   Lack of Transportation (Non-Medical): No  Physical Activity: Not on file  Stress: Not on file  Social Connections: Unknown   Frequency of Communication with Friends and Family: Three times a week   Frequency of Social Gatherings with Friends and Family: More than three times a week   Attends Religious Services: Not on file   Active Member of Clubs or Organizations: Not on file   Attends Archivist Meetings: Not on file   Marital Status: Not on file  Intimate  Partner Violence: Not on file    Review of Systems:    Constitutional: No weight loss, fever or chills Cardiovascular: No chest pain Respiratory: No SOB   Gastrointestinal: See HPI and otherwise negative   Physical Exam:  Vital signs: BP 118/90   Pulse 95   Ht 5\' 8"  (1.727 m)   Wt 112 lb (50.8 kg)   SpO2 98%   BMI 17.03 kg/m   Constitutional:   Pleasant chronically ill, cachectic, AA male appears to be in NAD, Well developed, Well nourished, alert and cooperative Respiratory: Respirations even and unlabored. Lungs clear to auscultation bilaterally.   No wheezes, crackles, or rhonchi.  Cardiovascular: Normal S1, S2. No MRG. Regular rate and rhythm. No peripheral edema, cyanosis or pallor.  Gastrointestinal:  Soft, nondistended, moderate LLQ ttp, No rebound or guarding. Normal bowel sounds. No appreciable masses or hepatomegaly.+g-tube Rectal:  Not performed.  Psychiatric: Demonstrates good judgement and reason without abnormal affect or behaviors.  RELEVANT LABS AND IMAGING: CBC    Component Value Date/Time   WBC 3.1 (L) 01/10/2021 0921   RBC 3.51 (L) 01/10/2021 0921   HGB 11.2 (L) 01/10/2021 0921   HGB 14.0 05/04/2019 1207   HCT 33.8 (L) 01/10/2021 0921   PLT 199 01/10/2021 0921   PLT 152 05/04/2019 1207   MCV 96.3 01/10/2021 0921   MCH 31.9 01/10/2021 0921   MCHC 33.1 01/10/2021 0921   RDW 13.2 01/10/2021 0921   LYMPHSABS 1.2 01/10/2021 0921   MONOABS 0.3 01/10/2021 0921   EOSABS 0.1 01/10/2021 0921   BASOSABS 0.0 01/10/2021 0921    CMP     Component Value Date/Time   NA 140 01/10/2021 0921   K 4.3 01/10/2021 0921   CL 101 01/10/2021 0921   CO2 29 01/10/2021 0921   GLUCOSE 97 01/10/2021 0921   BUN 8 01/10/2021 0921   CREATININE 0.67 01/10/2021 0921   CREATININE 0.61 (L) 10/26/2019 1523   CALCIUM 9.6 01/10/2021 0921   PROT 8.1 01/10/2021 0921   ALBUMIN 4.1 01/10/2021 0921   AST 15 01/10/2021 0921   ALT 6 01/10/2021 0921   ALT 53 (H) 09/06/2019 1543   ALKPHOS 60 01/10/2021 0921   BILITOT 0.4 01/10/2021 0921   GFRNONAA >60 01/10/2021 0921   GFRAA >60 01/03/2020 1355   GFRAA >60 05/04/2019 1207     Assessment: 1.  Nausea and vomiting in the setting of chemotherapy: Patient was initially on "some other medicine", which was not working, recently given Zofran 8 mg and Compazine which she has not tried yet 2.  Diarrhea: Likely due to chemo  Plan: 1.  Discussed with patient and family member that I not feel like we need to do investigation for his nausea and vomiting as he has a reason for this given his chemotherapy currently.  We just need to find a way to manage it. 2.  Would recommend that they go ahead and schedule Zofran 8 mg every 6 hours.  If he has breakthrough nausea/vomiting in between he can use Compazine.  They verbalized understanding. 3.  Briefly discussed diarrhea.  If this becomes bothersome can try Imodium. 4.  Patient/family will contact us if above scheduling is not working and we can discuss alternate medications. 5.  Patient to follow in clinic as needed.  Ellouise Newer, PA-C [facility] 02/27/2021, 8:52 AM  Cc: [name], MD

## 2021-02-27 NOTE — Patient Instructions (Signed)
If you are age 68 or older, your body mass index should be between 23-30. Your Body mass index is 17.03 kg/m. If this is out of the aforementioned range listed, please consider follow up with your Primary Care Provider.  If you are age 71 or younger, your body mass index should be between 19-25. Your Body mass index is 17.03 kg/m. If this is out of the aformentioned range listed, please consider follow up with your Primary Care Provider.   Try scheduling Zofran every 6 hours. If you are still  nausea use compazine.  The [facility] providers would like to encourage you to use [facility] to communicate with providers for non-urgent requests or questions.  Due to long hold times on the telephone, sending your provider a message by [facility] may be a faster and more efficient way to get a response.  Please allow 48 business hours for a response.  Please remember that this is for non-urgent requests.   It was a pleasure to see you today!  Thank you for trusting me with your gastrointestinal care!     Ellouise Newer, PA-C

## 2021-02-28 ENCOUNTER — Ambulatory Visit: Payer: Medicare HMO | Admitting: Hematology and Oncology

## 2021-03-01 ENCOUNTER — Telehealth: Payer: Self-pay | Admitting: Student

## 2021-03-01 NOTE — Telephone Encounter (Signed)
Called patient's sister, [name], and left message requesting a return call to reschedule the Palliative Consult - left my name and call back number.

## 2021-03-04 NOTE — Progress Notes (Signed)
[place] FOLLOW UP NOTE  Patient Care Team: [name], MD as PCP - General (Internal Medicine) Izora Gala, MD as Consulting Physician (Otolaryngology) [name], MD as Attending Physician (Radiation Oncology) Leota Sauers, RN (Inactive) as Oncology Nurse Navigator Karie Mainland, RD as Dietitian (Nutrition) Sharen Counter, CCC-SLP as Speech Language Pathologist (Speech Pathology) Kennith Center, LCSW as Social Worker [name], MD as Consulting Physician (Hematology and Oncology) Malmfelt, Stephani Police, RN as Oncology Nurse Navigator  CHIEF COMPLAINTS/PURPOSE OF CONSULTATION:  Follow up to discuss imaging results and treatment recommendations.  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 He is unfortunately not a candidate for any more radiation and surgery. He was recommended palliative chemotherapy or immunotherapy He is PDL1 neg, hence he may not benefit from [place] Also given his fraility, baseline PS, we agreed to try single agent taxane.  We have discussed about side effects of docetaxel including but not limited to fatigue, nausea, alopecia, diarrhea, increased risk of infections, neuropathy.  He understands that some of the side effects can be permanent and life-threatening.  He is however willing to try it.  He is due for cycle 1 day 1 of docetaxel today.  No concerning symptoms to hold treatment.  CBC reviewed and satisfactory to proceed.  CMP pending at the time of my visit.  Okay to proceed if labs are within parameters.  I plan to give him 2 cycles and repeat the scan.  Unfortunately his lymphadenopathy in the neck is very hard to palpate  because of the previous radiation and associated skin tightening. We will have to repeat imaging to assess response after 2 cycles of chemotherapy.  Mucositis due to antineoplastic therapy No active mucositis on exam.  No need for pain medication at this time.  G tube feedings [facility]) He says he is unable to swallow any food.  He is G-tube dependent, uses about 4 to 5 cans of Osmolite, sometimes regurgitates a portion of the Osmolite.  His weight has been stable on the Osmolite.  We will continue to monitor.  No orders of the defined types were placed in this encounter.   HISTORY OF PRESENTING ILLNESS:   [name] 70 y.o. male is here for follow up after his most recent PET CT  Oncology History Overview Note    [name] is a 70 y.o. male who has been treated for laryngeal cancer in the past.   Aug 2020, Pt had presented with dysphagia and difficulty breathing through his mouth, 20 lb weight loss. He saw Dr. Erik Obey who performed laryngoscopy and appreciated a 2 to 3 cm ovoid pedunculated mass arising from the right aryepiglottic fold and impinging on the supraglottic airway.  The vocal cords appeared to be mobile.  Urgent panendoscopy and biopsy were recommended to protect his airway.   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 According to his op note, a bulky necrotic and semi-pedunculated tumor coming off the lateral surface of the right aryepiglottic fold and the anterior and lateral aspect of the piriform sinus on the right side.  Airway was compromised by the tumor mass at intubation.  CT scan of the neck on 04/11/2019 showed glottic closure with no asymmetry of the cords, correlate with laryngoscopy results.  Questionable right level 2 lymph node measured 7 mm.  No definite pathologic lymph nodes in the neck.   PET scan on 04/22/2019 revealed mild residual activity in the posterior right  hypopharynx confined to the mucosa with no evidence of metastatic disease to the neck or distantly.  He underwent definitive radiotherapy to T2N0 laryngeal cancer  since he refused surgical treatment.  He then had PET imaging which showed a right neck lymph node, suspicious on PET. Biopsy of right neck lymph node is positive for recurrence. He underwent right modified radical neck dissection including levels 2 and 3, including the internal jugular vein, suture marks the inferior jugular vein stump.  He most recently underwent a soft tissue neck CT scan on the date of 07/05/2020 that revealed asymmetric soft tissue thickening and enhancement along the right lateral pharynx. It also showed suspicious small 6 mm hyperenhancing nodular soft tissue along the posterior margin of the neck dissect at right level 3. Additionally, there was a mild inflammatory appearing right upper lobe centrilobular ground-glass opacity that was new (consider mild or developing upper lobe infection), post radiation changes to the lung apices, and sequelae of radiation and right neck dissection from previous neck CT.   Subsequently, the patient underwent a direct laryngoscopy and biopsy of oropharyngeal mass on the date of 08/13/2020. Pathology from the procedure revealed poorly differentiated squamous cell carcinoma.  PET showed recurrence of head and neck cancer with broad hypermetabolic pharyngeal mucosal lesion involving right lateral posterior oropharynx and hypopharynx extending from palatine tonsil to the vallecula.Hypermetabolic lymph node posterior sternocleidomastoid muscle on the RIGHT (level 3).Hypermetabolic nodule within the LEFT parotid glands favored small primary parotid neoplasm.  Recommendation was to consider proceeding with upfront concurrent chemo radiation followed by salvage surgery if needed. He completed 6 weekly cycles of chemotherapy and concurrent radiation, 10/2020.  PET scan 01/31/2021 with  worsening nodal disease in the left neck and a right supraclavicular LN as discussed. Post treatment changes with soft tissue fullness in the area of the prior tumor but with markedly diminished metabolic activity.     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 SEE NOTE   04/11/2019 Imaging   CT neck: IMPRESSION: The glottis is closed with suboptimal evaluation of the vocal cords. No asymmetry of the cords. Correlate with laryngoscopy results.   No enlarged lymph nodes in the neck. 7 mm right level 2 lymph node may be reactive. No definite pathologic lymph nodes in the neck.   04/22/2019 Imaging   PET: IMPRESSION: 1. Mild residual activity in the posterior RIGHT hypopharynx confined to the mucosa. 2. No evidence of hypermetabolic metastatic lymph nodes in LEFT or RIGHT neck. 3. No evidence distant metastatic disease.   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 He cant quite speak because of hoarseness and pain in his throat. He says he is throwing up a little bit of  osmolite, using 4/5 cans a day. Weight has been the same. Diarrhea likely from tube feeds. No change in breathing Cleaning G tube every 2 days. No fevers or chills. Rest of the pertinent 10 point ROS reviewed and neg  MEDICAL HISTORY:  Past Medical History:  Diagnosis Date   Cancer ([place])    Throat cancer 2019   ETOH abuse    Frequent urination    Glaucoma    Hepatitis C virus infection without hepatic coma    dx'ed in 11/2018   History of radiation therapy 05/12/19- 07/06/19   Larynx   Hypertension    Wears denture    upper only; lost lower denture    SURGICAL HISTORY: Past Surgical History:  Procedure Laterality Date   ANKLE SURGERY  2011   right ankle   COLONOSCOPY  02/2019   polyps - Dr Havery Moros   DIRECT LARYNGOSCOPY N/A 04/01/2019   Procedure: DIRECT LARYNGOSCOPY WITH BIOPSY;  Surgeon: Jodi Marble, MD;  Location: [facility];  Service: ENT;  Laterality: N/A;   DIRECT LARYNGOSCOPY N/A 01/06/2020   Procedure: DIRECT LARYNGOSCOPY;  Surgeon: Izora Gala, MD;  Location: [facility];  Service: ENT;  Laterality: N/A;   DIRECT LARYNGOSCOPY N/A 08/13/2020   Procedure: DIRECT LARYNGOSCOPY;  Surgeon: Izora Gala, MD;  Location: [facility];  Service: ENT;  Laterality: N/A;   ESOPHAGOSCOPY N/A 04/01/2019   Procedure: ESOPHAGOSCOPY;  Surgeon: Jodi Marble, MD;  Location: [facility];  Service: ENT;  Laterality: N/A;   EXCISION ORAL TUMOR Right 08/13/2020   Procedure: BIOPSY OF OROPHARYNGEAL MASS;  Surgeon: Izora Gala, MD;  Location: [facility];  Service: ENT;  Laterality: Right;   EYE SURGERY Right    IR CM INJ ANY COLONIC TUBE W/FLUORO  12/20/2020   IR GASTROSTOMY TUBE MOD SED  09/24/2020   IR IMAGING GUIDED PORT INSERTION  09/24/2020   KNEE SURGERY     LARYNGOSCOPY AND BRONCHOSCOPY N/A 04/01/2019   Procedure: BRONCHOSCOPY;  Surgeon: Jodi Marble, MD;  Location: [facility];  Service: ENT;  Laterality: N/A;   RADICAL NECK DISSECTION N/A 01/06/2020   Procedure: RADICAL  NECK DISSECTION;  Surgeon: Izora Gala, MD;  Location: [facility] OR;  Service: ENT;  Laterality: N/A;    SOCIAL HISTORY: Social History   Socioeconomic History   Marital status: Single    Spouse name: Not on file   Number of children: 2   Years of education: Not on file   Highest education level: Not on file  Occupational History   Not on file  Tobacco Use   Smoking status: Former    Years: 50.00    Pack years: 0.00    Types: Cigarettes    Quit date: 06/01/2020    Years since quitting: 0.7   Smokeless tobacco: Never  Vaping Use   Vaping Use: Never used  Substance and Sexual Activity   Alcohol use: Yes    Alcohol/week: 4.0 standard drinks    Types: 4 Cans of beer per week  Comment: 40oz beer daily   Drug use: Yes    Types: Cocaine    Comment: none in 2 yrs   Sexual activity: Yes    Partners: Female  Other Topics Concern   Not on file  Social History Narrative   Patient is divorced with 2 children.   Patient is currently living with his sister.   Patient with a history of smoking a third of pack of cigarettes daily for 50 years.  Patient currently smoking 2 to 3 cigarettes/day.   Patient has never used smokeless tobacco.   Patient with occasional use of alcohol.   Patient last used cocaine approximately 6 months ago.  Patient denies use of marijuana.   Social Determinants of Health   Financial Resource Strain: Not on file  Food Insecurity: No Food Insecurity   Worried About Charity fundraiser in the Last Year: Never true   Ran Out of Food in the Last Year: Never true  Transportation Needs: No Transportation Needs   Lack of Transportation (Medical): No   Lack of Transportation (Non-Medical): No  Physical Activity: Not on file  Stress: Not on file  Social Connections: Unknown   Frequency of Communication with Friends and Family: Three times a week   Frequency of Social Gatherings with Friends and Family: More than three times a week   Attends Religious Services: Not  on file   Active Member of Clubs or Organizations: Not on file   Attends Archivist Meetings: Not on file   Marital Status: Not on file  Intimate Partner Violence: Not on file    FAMILY HISTORY: Family History  Problem Relation Age of Onset   Breast cancer Sister    Colon cancer Brother 3       ????   Cancer Brother     ALLERGIES:  has No Known Allergies.  MEDICATIONS:  Current Outpatient Medications  Medication Sig Dispense Refill   atropine 1 % ophthalmic solution Place 1 drop into the right eye 2 (two) times a day.      cetirizine (ZYRTEC) 10 MG tablet Take 10 mg by mouth daily as needed for allergies.     dexamethasone (DECADRON) 4 MG tablet Take 2 tablets (8 mg total) by mouth 2 (two) times daily. Start the day before Taxotere. Then daily after chemo for 2 days. 30 tablet 1   docusate sodium (COLACE) 50 MG capsule Take 1 capsule (50 mg total) by mouth 2 (two) times daily. 10 capsule 0   fluconazole (DIFLUCAN) 200 MG tablet Take 1 tablet (200 mg total) by mouth daily. 1 tablet 0   HYDROcodone-acetaminophen (HYCET) 7.5-325 mg/15 ml solution Take 10 mLs by mouth 4 (four) times daily as needed for moderate pain (Cancer pain). 473 mL 0   ibuprofen (ADVIL) 200 MG tablet Take 400 mg by mouth every 6 (six) hours as needed for headache or moderate pain.     latanoprost (XALATAN) 0.005 % ophthalmic solution Place 1 drop into the right eye at bedtime.      lidocaine-prilocaine (EMLA) cream Apply to affected area once 30 g 3   losartan (COZAAR) 50 MG tablet Take 50 mg by mouth daily.     Nutritional Supplements (FEEDING SUPPLEMENT, OSMOLITE 1.5 CAL,) LIQD Give 1 carton Osmolite 1.5 via PEG 4 times daily with 90 mL free water before and after bolus feeding.  Increase to 1-1/2 cartons 4 times daily.  In addition, drink or flush tube with an additional 240 mL free water  twice daily.  This provides 2130 cal, 89.4 g protein and 2352 mL free water which is 100% estimated needs. 1422 mL 0    ondansetron (ZOFRAN) 8 MG tablet Take 1 tablet (8 mg total) by mouth 2 (two) times daily as needed for refractory nausea / vomiting. 30 tablet 1   prednisoLONE acetate (PRED FORTE) 1 % ophthalmic suspension Place 1 drop into both eyes daily.     prochlorperazine (COMPAZINE) 10 MG tablet Take 1 tablet (10 mg total) by mouth every 6 (six) hours as needed (Nausea or vomiting). 30 tablet 1   tolterodine (DETROL LA) 4 MG 24 hr capsule Take 4 mg by mouth daily.     Vitamin D, Ergocalciferol, (DRISDOL) 1.25 MG (50000 UNIT) CAPS capsule Take 50,000 Units by mouth once a week.     No current facility-administered medications for this visit.   Facility-Administered Medications Ordered in Other Visits  Medication Dose Route Frequency Provider Last Rate Last Admin   0.9 %  sodium chloride infusion   Intravenous Once Philbert Ocallaghan, MD       dexamethasone (DECADRON) 10 mg in sodium chloride 0.9 % 50 mL IVPB  10 mg Intravenous Once Undra Trembath, MD       DOCEtaxel (TAXOTERE) 120 mg in sodium chloride 0.9 % 250 mL chemo infusion  75 mg/m2 (Treatment Plan Recorded) Intravenous Once Nithin Demeo, MD       heparin lock flush 100 unit/mL  500 Units Intracatheter Once PRN Kierria Feigenbaum, MD       sodium chloride flush (NS) 0.9 % injection 10 mL  10 mL Intracatheter PRN Kately Graffam, MD        PHYSICAL EXAMINATION:   ECOG PERFORMANCE STATUS: 0 - Asymptomatic  Vitals:   03/05/21 1300  BP: 124/80  Pulse: 88  Resp: 17  Temp: 97.7 F (36.5 C)  SpO2: 97%   Filed Weights   03/05/21 1300  Weight: 113 lb 3.2 oz (51.3 kg)   Physical Exam Constitutional:      General: He is not in acute distress.    Comments: Frail   HENT:     Head: Normocephalic.     Mouth/Throat:     Mouth: Mucous membranes are moist.     Pharynx: No oropharyngeal exudate or posterior oropharyngeal erythema.  Cardiovascular:     Rate and Rhythm: Normal rate and regular rhythm.  Pulmonary:     Effort: Pulmonary effort  is normal.  Abdominal:     General: Abdomen is flat.     Palpations: Abdomen is soft.     Comments: G tube with some yellow drainage from around the tube, no clear evidence of infection   Musculoskeletal:        General: No swelling or tenderness.     Cervical back: Normal range of motion and neck supple. No rigidity.  Lymphadenopathy:     Cervical: No cervical adenopathy (I couldnt palpate residual lymphadenopathy. His neck skin is very tight from previous radiation).  Skin:    General: Skin is warm and dry.  Neurological:     General: No focal deficit present.     Mental Status: He is alert.  Psychiatric:        Mood and Affect: Mood normal.   No change in exam from last visit.  LABORATORY DATA:  I have reviewed the data as listed Lab Results  Component Value Date   WBC 4.3 03/05/2021   HGB 11.4 (L) 03/05/2021   HCT 33.0 (L) 03/05/2021  MCV 90.4 03/05/2021   PLT 161 03/05/2021     Chemistry      Component Value Date/Time   NA 140 01/10/2021 0921   K 4.3 01/10/2021 0921   CL 101 01/10/2021 0921   CO2 29 01/10/2021 0921   BUN 8 01/10/2021 0921   CREATININE 0.67 01/10/2021 0921   CREATININE 0.61 (L) 10/26/2019 1523      Component Value Date/Time   CALCIUM 9.6 01/10/2021 0921   ALKPHOS 60 01/10/2021 0921   AST 15 01/10/2021 0921   ALT 6 01/10/2021 0921   ALT 53 (H) 09/06/2019 1543   BILITOT 0.4 01/10/2021 0921       RADIOGRAPHIC STUDIES: I have personally reviewed the radiological images as listed and agreed with the findings in the report. No results found.  PDL1 negative < 1%  All questions were answered. The patient knows to call the clinic with any problems, questions or concerns.  I spent 30 minutes in the care of this patient including H and P, review of records, counseling and coordination of care. We reviewed treatment plan, anticipated adverse effects, reviewed labs and follow up recommendations.    [name], MD 03/05/2021 1:52 PM

## 2021-03-05 ENCOUNTER — Other Ambulatory Visit: Payer: Self-pay

## 2021-03-05 ENCOUNTER — Inpatient Hospital Stay: Payer: Medicare HMO

## 2021-03-05 ENCOUNTER — Telehealth: Payer: Self-pay | Admitting: Student

## 2021-03-05 ENCOUNTER — Inpatient Hospital Stay (HOSPITAL_BASED_OUTPATIENT_CLINIC_OR_DEPARTMENT_OTHER): Payer: Medicare HMO | Admitting: Hematology and Oncology

## 2021-03-05 ENCOUNTER — Encounter: Payer: Self-pay | Admitting: Hematology and Oncology

## 2021-03-05 VITALS — BP 148/92 | HR 75 | Temp 99.0°F | Resp 18

## 2021-03-05 DIAGNOSIS — C090 Malignant neoplasm of tonsillar fossa: Secondary | ICD-10-CM

## 2021-03-05 DIAGNOSIS — K1231 Oral mucositis (ulcerative) due to antineoplastic therapy: Secondary | ICD-10-CM | POA: Diagnosis not present

## 2021-03-05 DIAGNOSIS — Z931 Gastrostomy status: Secondary | ICD-10-CM | POA: Diagnosis not present

## 2021-03-05 DIAGNOSIS — Z95828 Presence of other vascular implants and grafts: Secondary | ICD-10-CM

## 2021-03-05 DIAGNOSIS — C321 Malignant neoplasm of supraglottis: Secondary | ICD-10-CM

## 2021-03-05 DIAGNOSIS — Z5111 Encounter for antineoplastic chemotherapy: Secondary | ICD-10-CM | POA: Diagnosis not present

## 2021-03-05 LAB — CMP (CANCER CENTER ONLY)
ALT: 13 U/L (ref 0–44)
AST: 21 U/L (ref 15–41)
Albumin: 4.2 g/dL (ref 3.5–5.0)
Alkaline Phosphatase: 56 U/L (ref 38–126)
Anion gap: 8 (ref 5–15)
BUN: 11 mg/dL (ref 8–23)
CO2: 30 mmol/L (ref 22–32)
Calcium: 9.2 mg/dL (ref 8.9–10.3)
Chloride: 97 mmol/L — ABNORMAL LOW (ref 98–111)
Creatinine: 0.63 mg/dL (ref 0.61–1.24)
GFR, Estimated: >60 mL/min (ref >60)
Glucose, Bld: 124 mg/dL — ABNORMAL HIGH (ref 70–99)
Potassium: 4.3 mmol/L (ref 3.5–5.1)
Sodium: 135 mmol/L (ref 135–145)
Total Bilirubin: 0.6 mg/dL (ref 0.3–1.2)
Total Protein: 8.1 g/dL (ref 6.5–8.1)

## 2021-03-05 LAB — CBC WITH DIFFERENTIAL (CANCER CENTER ONLY)
Abs Immature Granulocytes: 0.01 10*3/uL (ref 0.00–0.07)
Basophils Absolute: 0 10*3/uL (ref 0.0–0.1)
Basophils Relative: 1 %
Eosinophils Absolute: 0 10*3/uL (ref 0.0–0.5)
Eosinophils Relative: 1 %
HCT: 33 % — ABNORMAL LOW (ref 39.0–52.0)
Hemoglobin: 11.4 g/dL — ABNORMAL LOW (ref 13.0–17.0)
Immature Granulocytes: 0 %
Lymphocytes Relative: 16 %
Lymphs Abs: 0.7 10*3/uL (ref 0.7–4.0)
MCH: 31.2 pg (ref 26.0–34.0)
MCHC: 34.5 g/dL (ref 30.0–36.0)
MCV: 90.4 fL (ref 80.0–100.0)
Monocytes Absolute: 0.5 10*3/uL (ref 0.1–1.0)
Monocytes Relative: 13 %
Neutro Abs: 3 10*3/uL (ref 1.7–7.7)
Neutrophils Relative %: 69 %
Platelet Count: 161 10*3/uL (ref 150–400)
RBC: 3.65 MIL/uL — ABNORMAL LOW (ref 4.22–5.81)
RDW: 13 % (ref 11.5–15.5)
WBC Count: 4.3 10*3/uL (ref 4.0–10.5)
nRBC: 0 % (ref 0.0–0.2)

## 2021-03-05 MED ORDER — SODIUM CHLORIDE 0.9 % IV SOLN
Freq: Once | INTRAVENOUS | Status: AC
  Filled 2021-03-05: qty 250

## 2021-03-05 MED ORDER — LIDOCAINE-PRILOCAINE 2.5-2.5 % EX CREA: TOPICAL_CREAM | CUTANEOUS | 3 refills | Status: DC

## 2021-03-05 MED ORDER — SODIUM CHLORIDE 0.9% FLUSH
10.0000 mL | Freq: Once | INTRAVENOUS | Status: AC
Start: 2021-03-05 — End: 2021-03-05
  Administered 2021-03-05: 10 mL
  Filled 2021-03-05: qty 10

## 2021-03-05 MED ORDER — ONDANSETRON HCL 8 MG PO TABS: 8.0000 mg | ORAL_TABLET | Freq: Two times a day (BID) | ORAL | 1 refills | Status: DC | PRN

## 2021-03-05 MED ORDER — SODIUM CHLORIDE 0.9 % IV SOLN
10.0000 mg | Freq: Once | INTRAVENOUS | Status: AC
  Administered 2021-03-05: 10 mg via INTRAVENOUS
  Filled 2021-03-05: qty 10

## 2021-03-05 MED ORDER — DEXAMETHASONE 4 MG PO TABS: 8.0000 mg | ORAL_TABLET | Freq: Two times a day (BID) | ORAL | 1 refills | Status: DC

## 2021-03-05 MED ORDER — HEPARIN SOD (PORK) LOCK FLUSH 100 UNIT/ML IV SOLN
500.0000 [IU] | Freq: Once | INTRAVENOUS | Status: AC | PRN
  Administered 2021-03-05: 500 [IU]
  Filled 2021-03-05: qty 5

## 2021-03-05 MED ORDER — PROCHLORPERAZINE MALEATE 10 MG PO TABS: 10.0000 mg | ORAL_TABLET | Freq: Four times a day (QID) | ORAL | 1 refills | Status: DC | PRN

## 2021-03-05 MED ORDER — SODIUM CHLORIDE 0.9% FLUSH
10.0000 mL | INTRAVENOUS | Status: DC | PRN
  Administered 2021-03-05: 10 mL
  Filled 2021-03-05: qty 10

## 2021-03-05 MED ORDER — SODIUM CHLORIDE 0.9 % IV SOLN
75.0000 mg/m2 | Freq: Once | INTRAVENOUS | Status: AC
  Administered 2021-03-05: 120 mg via INTRAVENOUS
  Filled 2021-03-05: qty 12

## 2021-03-05 NOTE — Telephone Encounter (Signed)
Returned call to patient's sister, [name], and have rescheduled the Palliative Consult to 04/03/21 @ 12:30 PM.

## 2021-03-05 NOTE — Patient Instructions (Signed)

## 2021-03-05 NOTE — Assessment & Plan Note (Signed)
This is a very 45 pleasant male patient with laryngeal cancer s/p previous definitive RT in 2020 and most recently had concurrent CRT completed 10/2020 He had end of treatment PET scan which showed bilateral lymphadenopathy which is concerning for residual disease. He is unfortunately not a candidate for any more radiation and surgery. He was recommended palliative chemotherapy or immunotherapy He is PDL1 neg, hence he may not benefit from [place] Also given his fraility, baseline PS, we agreed to try single agent taxane.  We have discussed about side effects of docetaxel including but not limited to fatigue, nausea, alopecia, diarrhea, increased risk of infections, neuropathy.  He understands that some of the side effects can be permanent and life-threatening.  He is however willing to try it.  He is due for cycle 1 day 1 of docetaxel today.  No concerning symptoms to hold treatment.  CBC reviewed and satisfactory to proceed.  CMP pending at the time of my visit.  Okay to proceed if labs are within parameters.  I plan to give him 2 cycles and repeat the scan.  Unfortunately his lymphadenopathy in the neck is very hard to palpate because of the previous radiation and associated skin tightening. We will have to repeat imaging to assess response after 2 cycles of chemotherapy.

## 2021-03-05 NOTE — Assessment & Plan Note (Signed)
No active mucositis on exam.  No need for pain medication at this time.

## 2021-03-05 NOTE — Patient Instructions (Signed)
[facility]  Discharge Instructions: Thank you for choosing [facility] to provide your oncology and hematology care.   If you have a lab appointment with the [facility], please go directly to the [facility] and check in at the registration area.   Wear comfortable clothing and clothing appropriate for easy access to any Portacath or PICC line.   We strive to give you quality time with your provider. You may need to reschedule your appointment if you arrive late (15 or more minutes).  Arriving late affects you and other patients whose appointments are after yours.  Also, if you miss three or more appointments without notifying the office, you may be dismissed from the clinic at the provider's discretion.      For prescription refill requests, have your pharmacy contact our office and allow 72 hours for refills to be completed.    Today you received the following chemotherapy and/or immunotherapy agents Taxotere     To help prevent nausea and vomiting after your treatment, we encourage you to take your nausea medication as directed.  BELOW ARE SYMPTOMS THAT SHOULD BE REPORTED IMMEDIATELY: *FEVER GREATER THAN 100.4 F (38 C) OR HIGHER *CHILLS OR SWEATING *NAUSEA AND VOMITING THAT IS NOT CONTROLLED WITH YOUR NAUSEA MEDICATION *UNUSUAL SHORTNESS OF BREATH *UNUSUAL BRUISING OR BLEEDING *URINARY PROBLEMS (pain or burning when urinating, or frequent urination) *BOWEL PROBLEMS (unusual diarrhea, constipation, pain near the anus) TENDERNESS IN MOUTH AND THROAT WITH OR WITHOUT PRESENCE OF ULCERS (sore throat, sores in mouth, or a toothache) UNUSUAL RASH, SWELLING OR PAIN  UNUSUAL VAGINAL DISCHARGE OR ITCHING   Items with * indicate a potential emergency and should be followed up as soon as possible or go to the Emergency Department if any problems should occur.  Please show the CHEMOTHERAPY ALERT CARD or IMMUNOTHERAPY ALERT CARD at check-in to the  Emergency Department and triage nurse.  Should you have questions after your visit or need to cancel or reschedule your appointment, please contact [facility]  Dept: [phone]  and follow the prompts.  Office hours are 8:00 a.m. to 4:30 p.m. Monday - Friday. Please note that voicemails left after 4:00 p.m. may not be returned until the following business day.  We are closed weekends and major holidays. You have access to a nurse at all times for urgent questions. Please call the main number to the clinic Dept: [phone] and follow the prompts.   For any non-urgent questions, you may also contact your provider using MyChart. We now offer e-Visits for anyone 38 and older to request care online for non-urgent symptoms. For details visit [email or web address].   Also download the MyChart app! Go to the app store, search "MyChart", open the app, select [facility], and log in with your MyChart username and password.  Due to Covid, a mask is required upon entering the hospital/clinic. If you do not have a mask, one will be given to you upon arrival. For doctor visits, patients may have 1 support person aged 18 or older with them. For treatment visits, patients cannot have anyone with them due to current Covid guidelines and our immunocompromised population.    Docetaxel injection What is this medication? DOCETAXEL (doe se TAX el) is a chemotherapy drug. It targets fast dividing cells, like cancer cells, and causes these cells to die. This medicine is used to treat many types of cancers like breast cancer, certain stomach cancers,head and neck cancer, lung  cancer, and prostate cancer. This medicine may be used for other purposes; ask your health care provider orpharmacist if you have questions. COMMON BRAND NAME(S): Docefrez, Taxotere What should I tell my care team before I take this medication? They need to know if you have any of these conditions: infection  (especially a virus infection such as chickenpox, cold sores, or herpes) liver disease low blood counts, like low white cell, platelet, or red cell counts an unusual or allergic reaction to docetaxel, polysorbate 80, other chemotherapy agents, other medicines, foods, dyes, or preservatives pregnant or trying to get pregnant breast-feeding How should I use this medication? This drug is given as an infusion into a vein. It is administered in a hospitalor clinic by a specially trained health care professional. Talk to your pediatrician regarding the use of this medicine in children.Special care may be needed. Overdosage: If you think you have taken too much of this medicine contact apoison control center or emergency room at once. NOTE: This medicine is only for you. Do not share this medicine with others. What if I miss a dose? It is important not to miss your dose. Call your doctor or health careprofessional if you are unable to keep an appointment. What may interact with this medication? Do not take this medicine with any of the following medications: live virus vaccines This medicine may also interact with the following medications: aprepitant certain antibiotics like erythromycin or clarithromycin certain antivirals for HIV or hepatitis certain medicines for fungal infections like fluconazole, itraconazole, ketoconazole, posaconazole, or voriconazole cimetidine ciprofloxacin conivaptan cyclosporine dronedarone fluvoxamine grapefruit juice imatinib verapamil This list may not describe all possible interactions. Give your health care provider a list of all the medicines, herbs, non-prescription drugs, or dietary supplements you use. Also tell them if you smoke, drink alcohol, or use illegaldrugs. Some items may interact with your medicine. What should I watch for while using this medication? Your condition will be monitored carefully while you are receiving this medicine. You will  need important blood work done while you are taking thismedicine. Call your doctor or health care professional for advice if you get a fever, chills or sore throat, or other symptoms of a cold or flu. Do not treat yourself. This drug decreases your body's ability to fight infections. Try toavoid being around people who are sick. Some products may contain alcohol. Ask your health care professional if this medicine contains alcohol. Be sure to tell all health care professionals you are taking this medicine. Certain medicines, like metronidazole and disulfiram, can cause an unpleasant reaction when taken with alcohol. The reaction includes flushing, headache, nausea, vomiting, sweating, and increased thirst. Thereaction can last from 30 minutes to several hours. You may get drowsy or dizzy. Do not drive, use machinery, or do anything that needs mental alertness until you know how this medicine affects you. Do not stand or sit up quickly, especially if you are an older patient. This reduces the risk of dizzy or fainting spells. Alcohol may interfere with the effect ofthis medicine. Talk to your health care professional about your risk of cancer. You may bemore at risk for certain types of cancer if you take this medicine. Do not become pregnant while taking this medicine or for 6 months after stopping it. Women should inform their doctor if they wish to become pregnant or think they might be pregnant. There is a potential for serious side effects to an unborn child. Talk to your health care professional or pharmacist for  more information. Do not breast-feed an infant while taking this medicine orfor 1 week after stopping it. Males who get this medicine must use a condom during sex with females who can get pregnant. If you get a woman pregnant, the baby could have birth defects. The baby could die before they are born. You will need to continue wearing a condom for 3 months after stopping the medicine. Tell your  health care providerright away if your partner becomes pregnant while you are taking this medicine. This may interfere with the ability to father a child. You should talk to yourdoctor or health care professional if you are concerned about your fertility. What side effects may I notice from receiving this medication? Side effects that you should report to your doctor or health care professionalas soon as possible: allergic reactions like skin rash, itching or hives, swelling of the face, lips, or tongue blurred vision breathing problems changes in vision low blood counts - This drug may decrease the number of white blood cells, red blood cells and platelets. You may be at increased risk for infections and bleeding. nausea and vomiting pain, redness or irritation at site where injected pain, tingling, numbness in the hands or feet redness, blistering, peeling, or loosening of the skin, including inside the mouth signs of decreased platelets or bleeding - bruising, pinpoint red spots on the skin, black, tarry stools, nosebleeds signs of decreased red blood cells - unusually weak or tired, fainting spells, lightheadedness signs of infection - fever or chills, cough, sore throat, pain or difficulty passing urine swelling of the ankle, feet, hands Side effects that usually do not require medical attention (report to yourdoctor or health care professional if they continue or are bothersome): constipation diarrhea fingernail or toenail changes hair loss loss of appetite mouth sores muscle pain This list may not describe all possible side effects. Call your doctor for medical advice about side effects. You may report side effects to FDA at1-800-FDA-1088. Where should I keep my medication? This drug is given in a hospital or clinic and will not be stored at home. NOTE: This sheet is a summary. It may not cover all possible information. If you have questions about this medicine, talk to your  doctor, pharmacist, orhealth care provider.  2022 Elsevier/Gold Standard (2019-08-01 19:50:31)

## 2021-03-05 NOTE — Assessment & Plan Note (Signed)
He says he is unable to swallow any food.  He is G-tube dependent, uses about 4 to 5 cans of Osmolite, sometimes regurgitates a portion of the Osmolite.  His weight has been stable on the Osmolite.  We will continue to monitor.

## 2021-03-06 ENCOUNTER — Inpatient Hospital Stay: Payer: Medicare HMO | Admitting: Dietician

## 2021-03-06 ENCOUNTER — Ambulatory Visit: Payer: Medicare HMO

## 2021-03-06 ENCOUNTER — Telehealth: Payer: Self-pay

## 2021-03-06 DIAGNOSIS — R1313 Dysphagia, pharyngeal phase: Secondary | ICD-10-CM | POA: Diagnosis not present

## 2021-03-06 NOTE — Patient Instructions (Signed)
  I will check with Dr. Chryl Heck and see if you can be crushing your medication and putting it through your tube. That was the recommendation on your last swallow test.

## 2021-03-06 NOTE — Telephone Encounter (Signed)
Received word from SLP that patient was taking some medications PO. Spoke with patient's family member and reiterated that all meds must be taken via tube. Family member expressed understanding and said that she would do her best to ensure he took all medications correctly. I told her to call with any questions or concerns.

## 2021-03-06 NOTE — Therapy (Signed)
[facility] [address] [place], [place], [address] Phone: [phone]   Fax:  [phone]  Speech Language Pathology Treatment  Patient Details  Name: [name] MRN: [id number] Date of Birth: Jan 13, 1951 Referring Provider (SLP): [name], MD   Encounter Date: 03/06/2021   End of Session - 03/06/21 1340     Visit Number 12    Number of Visits 19    Date for SLP Re-Evaluation 04/19/21    Authorization Type humana    Authorization Time Period 02-27-21 (requesting 7 more visits with end date 04-29-21)    Authorization - Visit Number 12    Authorization - Number of Visits 16    SLP Start Time 1148    SLP Stop Time  1218    SLP Time Calculation (min) 30 min    Activity Tolerance Patient tolerated treatment well             Past Medical History:  Diagnosis Date   Cancer ([facility])    Throat cancer 2019   ETOH abuse    Frequent urination    Glaucoma    Hepatitis C virus infection without hepatic coma    dx'ed in 11/2018   History of radiation therapy 05/12/19- 07/06/19   Larynx   Hypertension    Wears denture    upper only; lost lower denture    Past Surgical History:  Procedure Laterality Date   ANKLE SURGERY  2011   right ankle   COLONOSCOPY  02/2019   polyps - Dr Havery Moros   DIRECT LARYNGOSCOPY N/A 04/01/2019   Procedure: DIRECT LARYNGOSCOPY WITH BIOPSY;  Surgeon: Jodi Marble, MD;  Location: [facility];  Service: ENT;  Laterality: N/A;   DIRECT LARYNGOSCOPY N/A 01/06/2020   Procedure: DIRECT LARYNGOSCOPY;  Surgeon: Izora Gala, MD;  Location: [facility];  Service: ENT;  Laterality: N/A;   DIRECT LARYNGOSCOPY N/A 08/13/2020   Procedure: DIRECT LARYNGOSCOPY;  Surgeon: Izora Gala, MD;  Location: [facility];  Service: ENT;  Laterality: N/A;   ESOPHAGOSCOPY N/A 04/01/2019   Procedure: ESOPHAGOSCOPY;  Surgeon: Jodi Marble, MD;  Location: [facility];  Service: ENT;  Laterality: N/A;   EXCISION  ORAL TUMOR Right 08/13/2020   Procedure: BIOPSY OF OROPHARYNGEAL MASS;  Surgeon: Izora Gala, MD;  Location: [facility];  Service: ENT;  Laterality: Right;   EYE SURGERY Right    IR CM INJ ANY COLONIC TUBE W/FLUORO  12/20/2020   IR GASTROSTOMY TUBE MOD SED  09/24/2020   IR IMAGING GUIDED PORT INSERTION  09/24/2020   KNEE SURGERY     LARYNGOSCOPY AND BRONCHOSCOPY N/A 04/01/2019   Procedure: BRONCHOSCOPY;  Surgeon: Jodi Marble, MD;  Location: [facility];  Service: ENT;  Laterality: N/A;   RADICAL NECK DISSECTION N/A 01/06/2020   Procedure: RADICAL NECK DISSECTION;  Surgeon: Izora Gala, MD;  Location: [facility];  Service: ENT;  Laterality: N/A;    There were no vitals filed for this visit.   Subjective Assessment - 03/06/21 1200     Subjective "I did them (HEP) during my radiation yesterday - well, not radiation, chemo." Pt tells SLP he has performed HEP every day since last session.    Currently in Pain? Yes    Pain Score 2     Pain Location Throat    Pain Orientation Mid    Pain Descriptors / Indicators Sore    Pain Type Chronic pain;Acute pain  ADULT SLP TREATMENT - 03/06/21 1202       General Information   Behavior/Cognition Pleasant mood;Lethargic;Requires cueing;Cooperative;Alert      Treatment Provided   Treatment provided Dysphagia      Dysphagia Treatment   Other treatment/comments Chistian's voice is similar to last session - limited/no "gurgly" voice. He reports he has completed HEP all but 2 dyas since last ST session, BID. Pt did not perform oral care prior to ST today so not POs were given. Pt was able to produce 6-7 effortful swallows regularly 2-3 seconds apart prior to SLP observing any s/sx fatigue. Caryl Ada was completed with 4-5 reps with 3 second hold times prior to s/sx fatigue being observed by SLP. Pt able to complete 4 more reps at 2-3 seconds hold time with 5 seconds rest between reps prior to s/sx fatigue. Pt performance  definitely improved over session on 02-19-21. SLP strongly encourgaed pt to cont to practice with HEP and told him progress was evident. Pt states he vomited this morning when taking medication - SLP suggested pt crush and put through PEG - SLP to talk to MD to verify pt can do so as he is likely aspirating liquid used to adminster meds.      Assessment / Recommendations / Plan   Plan Continue with current plan of care      Dysphagia Recommendations   Diet recommendations NPO    Medication Administration Via alternative means    Supervision Intermittent supervision to cue for compensatory strategies    Compensations Clear throat after each swallow;Hard cough after swallow;Multiple dry swallows after each bite/sip      Progression Toward Goals   Progression toward goals Progressing toward goals              SLP Education - 03/06/21 1339     Education Details best to crush meds and put them through PEG at this time, needs to do 40 reps/day of effortful and [employer]) Educated Patient    Methods Explanation    Comprehension Verbalized understanding              SLP Short Term Goals - 03/06/21 1357       SLP SHORT TERM GOAL #1   Title pt will demo correct procedure for HEP with rare min A, in 2 sessions    Baseline 01/02/21, 01-07-21    Period --   or 9 visits, for all STGs except STG #2   Status Achieved      SLP SHORT TERM GOAL #2   Title pt will tell SLP why he is completing HEP with rare min A    Baseline 12-26-20    Period --   or 5 total visits   Status Achieved      SLP SHORT TERM GOAL #3   Title pt will demo swallow precautions with occasional min A in two sessions    Status Deferred   pt now NPO     SLP SHORT TERM GOAL #4   Title pt will tell SLP 3 overt s/sx aspiration PNA in 2 sessions after SLP review of overt s/sx    Baseline 12-26-20, 01-07-21    Status Achieved      SLP SHORT TERM GOAL #5   Title pt will complete 10 effortful swallows in 60  seconds    Time 4    Period Weeks    Status Partially Met   and ongoing  SLP Long Term Goals - 03/06/21 1344       SLP LONG TERM GOAL #1   Title pt will demo proper procedure for HEP with modified independence over 3 sessions    Baseline 01-09-21, 02-19-21    Period --   or 17 visits, for all LTGs   Status Achieved      SLP LONG TERM GOAL #2   Title pt will perform 10 reps Mendelsohn in 75 seconds    Time 6    Status Partially Met   and ongoing   Target Date 04/19/21      SLP LONG TERM GOAL #3   Title pt will demo aspiration precauations with POs with occasional min A in 4 sessions total    Status Deferred   pt now NPO     SLP LONG TERM GOAL #4   Title pt will complete 10 effortful swallows in 60 seconds    Time 6    Period Weeks    Status New    Target Date 04/19/21      SLP LONG TERM GOAL #5   Title pt will complete effortful and Mendelsohn swallows independently between 2 sessions    Time 6    Status New    Target Date 04/19/21              Plan - 03/06/21 1341     Clinical Impression Statement Yurem presents today with severe dysphagia after FEES at [facility] on 12-03-20, and MBSS at [facility] in mid-May2022- severe to profound dysphagia continues with NPO for nutrition/hydration with pleasure feeds honey thick and water after thorough oral care with aspiration precautions mentioned in daily note from 02-08-21. He wants to continue with ST.  Pt tells SLP today he has completed HEP every day except two, since last ST and has completed 40 of both effortful swallow and mendelsohn. Pt continues with less secretions noted as in his previous session. Intensive swallow therapy was recommended after FEES, and after pt's MBSS. See "other comments" for more details of today's session. SLP will need to continue to follow to track proper completion of HEP and to assess safety with POs (pt's following of aspiration precautions). Pt told SLP today he is taking his meds PO.  SLP strongly suggested pt crush them- SLP has inquiry into pt's med onc to see if this is possible. Frequency will be at once/week for 8 weeks. If pt cont to show progress with success with HEP procedure ST frequency may be decr'd to once every two weeks.    Speech Therapy Frequency 1x /week    Duration --   6 full weeks(before 04-19-21)   Treatment/Interventions Aspiration precaution training;Pharyngeal strengthening exercises;Trials of upgraded texture/liquids;Diet toleration management by SLP;Internal/external aids;Patient/family education;SLP instruction and feedback;Environmental controls;Compensatory strategies    Potential to Achieve Goals Fair    Potential Considerations Previous level of function;Severity of impairments;Cooperation/participation level;Other (comment)   limited literacy   SLP Home Exercise Plan reviewed - Mendelsohn and Effortful swallow today    Consulted and Agree with Plan of Care Patient             Patient will benefit from skilled therapeutic intervention in order to improve the following deficits and impairments:   Pharyngeal dysphagia    Problem List Patient Active Problem List   Diagnosis Date Noted   Nausea and vomiting 01/24/2021   Shortness of breath 01/10/2021   Mucositis due to antineoplastic therapy 01/10/2021   G tube feedings ([facility]) 01/10/2021  Chemotherapy-induced diarrhea 10/30/2020   Port-A-Cath in place 10/23/2020   Cancer of tonsillar fossa ([facility]) 08/31/2020   Laryngeal cancer ([facility]) 01/06/2020   Metastatic cancer to cervical lymph nodes ([facility]) 12/01/2019   Chronic hepatitis C without hepatic coma ([facility]) 06/09/2019   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 [place], [place], [address] Phone: [phone]   Fax:   [phone]   Name: POWELL HALBERT MRN: [id number] Date of Birth: 20-Sep-1950

## 2021-03-06 NOTE — Progress Notes (Signed)
Nutrition Follow-up:  Patient with laryngeal cancer. He completed concurrent chemoradiation therapy 2/22. Patient currently receiving single agent docetaxel.   Met with patient and caregiver [name]) in clinic. He reports he has been completing SLP exercises, observed decreased secretions at visit today. Patient reports he has not been eating/drinking orally expect for medications. Patient reports episode of vomiting after swallowing pill this morning, SLP recommended medications crushed via tube. Caregiver reports patient eating broccoli cheddar soup by mouth a few weeks ago. Educated on strict NPO, pt understanding, says he has been compliant. Patient reports multiple episodes of diarrhea that started on Sunday evening and went through Tuesday morning. This is now resolved and has given 3 cartons of Osmolite 1.5 today, denies abdominal discomfort, nausea, diarrhea. Patient has been trying to increase tube feedings, says he has been giving 2 cartons TID over the past week. Previously, he was giving 4 cartons.   Medications: Zofran, compazine, decadron, hydrocodone  Labs: 6/22 Glucose 124  Anthropometrics: Weight 113 lb 3.2 oz today increased from 112 lb on 6/15 decreased from 116 lb 6.4 oz on 6/9  Estimated Energy Needs  Kcals: 2000-2200 Protein: 80-95 Fluid: 2.2 L  Osmolite 1.5 - 6 cartons/day provides 2130 kcal, 89.4 grams protein, 2352 ml free water  NUTRITION DIAGNOSIS: Unintended weight loss stable   INTERVENTION:  Diet advancement per SLP Encouraged patient to complete daily HEP  Continue Osmolite 1.5 - 2 cartons TID with 120 ml free water flush before and after each feeding Discussed taking nausea medication as prescribed - waiting 30 minutes before giving tube feeding Intermittent episodes of diarrhea resolved, do not feel this is related to tube feedings Patient has contact information    MONITORING, EVALUATION, GOAL: weight trends, intake, tube feedings   NEXT VISIT:  Tuesday July 12 in infusion

## 2021-03-07 ENCOUNTER — Inpatient Hospital Stay: Payer: Medicare HMO

## 2021-03-07 ENCOUNTER — Other Ambulatory Visit: Payer: Self-pay

## 2021-03-07 VITALS — BP 108/78 | HR 84 | Temp 98.7°F | Resp 16

## 2021-03-07 DIAGNOSIS — C090 Malignant neoplasm of tonsillar fossa: Secondary | ICD-10-CM

## 2021-03-07 DIAGNOSIS — Z5111 Encounter for antineoplastic chemotherapy: Secondary | ICD-10-CM | POA: Diagnosis not present

## 2021-03-07 MED ORDER — PEGFILGRASTIM-JMDB 6 MG/0.6ML ~~LOC~~ SOSY
PREFILLED_SYRINGE | SUBCUTANEOUS | Status: AC
  Filled 2021-03-07: qty 0.6

## 2021-03-07 MED ORDER — PEGFILGRASTIM-JMDB 6 MG/0.6ML ~~LOC~~ SOSY
6.0000 mg | PREFILLED_SYRINGE | Freq: Once | SUBCUTANEOUS | Status: AC
  Administered 2021-03-07: 6 mg via SUBCUTANEOUS

## 2021-03-07 NOTE — Patient Instructions (Signed)

## 2021-03-11 ENCOUNTER — Other Ambulatory Visit: Payer: Self-pay

## 2021-03-11 ENCOUNTER — Encounter (HOSPITAL_COMMUNITY): Payer: Self-pay

## 2021-03-11 ENCOUNTER — Inpatient Hospital Stay (HOSPITAL_COMMUNITY)
Admission: EM | Admit: 2021-03-11 | Discharge: 2021-03-14 | DRG: 155 | Disposition: A | Discharge status: Home / Self Care | Payer: Medicare HMO | Attending: Internal Medicine | Admitting: Internal Medicine

## 2021-03-11 ENCOUNTER — Emergency Department (HOSPITAL_COMMUNITY): Payer: Medicare HMO

## 2021-03-11 DIAGNOSIS — Z800 Family history of malignant neoplasm of digestive organs: Secondary | ICD-10-CM | POA: Diagnosis not present

## 2021-03-11 DIAGNOSIS — Z803 Family history of malignant neoplasm of breast: Secondary | ICD-10-CM | POA: Diagnosis not present

## 2021-03-11 DIAGNOSIS — Z923 Personal history of irradiation: Secondary | ICD-10-CM

## 2021-03-11 DIAGNOSIS — D72819 Decreased white blood cell count, unspecified: Secondary | ICD-10-CM | POA: Diagnosis present

## 2021-03-11 DIAGNOSIS — I10 Essential (primary) hypertension: Secondary | ICD-10-CM | POA: Diagnosis present

## 2021-03-11 DIAGNOSIS — Z681 Body mass index (BMI) 19 or less, adult: Secondary | ICD-10-CM

## 2021-03-11 DIAGNOSIS — R64 Cachexia: Secondary | ICD-10-CM | POA: Diagnosis present

## 2021-03-11 DIAGNOSIS — J3801 Paralysis of vocal cords and larynx, unilateral: Secondary | ICD-10-CM | POA: Diagnosis present

## 2021-03-11 DIAGNOSIS — T451X5A Adverse effect of antineoplastic and immunosuppressive drugs, initial encounter: Secondary | ICD-10-CM | POA: Diagnosis present

## 2021-03-11 DIAGNOSIS — C329 Malignant neoplasm of larynx, unspecified: Secondary | ICD-10-CM | POA: Diagnosis present

## 2021-03-11 DIAGNOSIS — Z79899 Other long term (current) drug therapy: Secondary | ICD-10-CM

## 2021-03-11 DIAGNOSIS — Z87891 Personal history of nicotine dependence: Secondary | ICD-10-CM

## 2021-03-11 DIAGNOSIS — Z931 Gastrostomy status: Secondary | ICD-10-CM

## 2021-03-11 DIAGNOSIS — F1010 Alcohol abuse, uncomplicated: Secondary | ICD-10-CM | POA: Diagnosis present

## 2021-03-11 DIAGNOSIS — Z20822 Contact with and (suspected) exposure to covid-19: Secondary | ICD-10-CM | POA: Diagnosis present

## 2021-03-11 DIAGNOSIS — Z85828 Personal history of other malignant neoplasm of skin: Secondary | ICD-10-CM | POA: Diagnosis not present

## 2021-03-11 DIAGNOSIS — J384 Edema of larynx: Secondary | ICD-10-CM | POA: Diagnosis present

## 2021-03-11 DIAGNOSIS — H409 Unspecified glaucoma: Secondary | ICD-10-CM | POA: Diagnosis present

## 2021-03-11 DIAGNOSIS — C321 Malignant neoplasm of supraglottis: Secondary | ICD-10-CM | POA: Diagnosis present

## 2021-03-11 DIAGNOSIS — R0602 Shortness of breath: Secondary | ICD-10-CM | POA: Diagnosis present

## 2021-03-11 LAB — COMPREHENSIVE METABOLIC PANEL
ALT: 11 U/L (ref 0–44)
AST: 14 U/L — ABNORMAL LOW (ref 15–41)
Albumin: 3.8 g/dL (ref 3.5–5.0)
Alkaline Phosphatase: 50 U/L (ref 38–126)
Anion gap: 8 (ref 5–15)
BUN: 11 mg/dL (ref 8–23)
CO2: 31 mmol/L (ref 22–32)
Calcium: 9.3 mg/dL (ref 8.9–10.3)
Chloride: 92 mmol/L — ABNORMAL LOW (ref 98–111)
Creatinine, Ser: 0.53 mg/dL — ABNORMAL LOW (ref 0.61–1.24)
GFR, Estimated: >60 mL/min (ref >60)
Glucose, Bld: 108 mg/dL — ABNORMAL HIGH (ref 70–99)
Potassium: 3.8 mmol/L (ref 3.5–5.1)
Sodium: 131 mmol/L — ABNORMAL LOW (ref 135–145)
Total Bilirubin: 0.8 mg/dL (ref 0.3–1.2)
Total Protein: 7.6 g/dL (ref 6.5–8.1)

## 2021-03-11 LAB — CBC WITH DIFFERENTIAL/PLATELET
Abs Immature Granulocytes: 0.02 10*3/uL (ref 0.00–0.07)
Basophils Absolute: 0 10*3/uL (ref 0.0–0.1)
Basophils Relative: 1 %
Eosinophils Absolute: 0 10*3/uL (ref 0.0–0.5)
Eosinophils Relative: 1 %
HCT: 32.8 % — ABNORMAL LOW (ref 39.0–52.0)
Hemoglobin: 11 g/dL — ABNORMAL LOW (ref 13.0–17.0)
Immature Granulocytes: 1 %
Lymphocytes Relative: 24 %
Lymphs Abs: 0.7 10*3/uL (ref 0.7–4.0)
MCH: 31.1 pg (ref 26.0–34.0)
MCHC: 33.5 g/dL (ref 30.0–36.0)
MCV: 92.7 fL (ref 80.0–100.0)
Monocytes Absolute: 0.6 10*3/uL (ref 0.1–1.0)
Monocytes Relative: 22 %
Neutro Abs: 1.4 10*3/uL — ABNORMAL LOW (ref 1.7–7.7)
Neutrophils Relative %: 51 %
Platelets: 150 10*3/uL (ref 150–400)
RBC: 3.54 MIL/uL — ABNORMAL LOW (ref 4.22–5.81)
RDW: 12.8 % (ref 11.5–15.5)
WBC: 2.8 10*3/uL — ABNORMAL LOW (ref 4.0–10.5)
nRBC: 0 % (ref 0.0–0.2)

## 2021-03-11 LAB — RESP PANEL BY RT-PCR (FLU A&B, COVID) ARPGX2
Influenza A by PCR: NEGATIVE
Influenza B by PCR: NEGATIVE
SARS Coronavirus 2 by RT PCR: NEGATIVE

## 2021-03-11 MED ORDER — MORPHINE SULFATE (PF) 2 MG/ML IV SOLN
1.0000 mg | INTRAVENOUS | Status: AC | PRN
Start: 2021-03-11 — End: 2021-03-12
  Administered 2021-03-12 (×3): 1 mg via INTRAVENOUS
  Filled 2021-03-11 (×3): qty 1

## 2021-03-11 MED ORDER — ACETAMINOPHEN 325 MG PO TABS
650.0000 mg | ORAL_TABLET | Freq: Four times a day (QID) | ORAL | Status: DC | PRN
  Administered 2021-03-13: 650 mg via ORAL
  Filled 2021-03-11: qty 2

## 2021-03-11 MED ORDER — ENOXAPARIN SODIUM 40 MG/0.4ML IJ SOSY
40.0000 mg | PREFILLED_SYRINGE | INTRAMUSCULAR | Status: DC
  Administered 2021-03-11 – 2021-03-13 (×3): 40 mg via SUBCUTANEOUS
  Filled 2021-03-11 (×3): qty 0.4

## 2021-03-11 MED ORDER — PREDNISOLONE ACETATE 1 % OP SUSP
1.0000 [drp] | Freq: Four times a day (QID) | OPHTHALMIC | Status: DC
  Administered 2021-03-12 – 2021-03-14 (×10): 1 [drp] via OPHTHALMIC
  Filled 2021-03-11: qty 5

## 2021-03-11 MED ORDER — IOHEXOL 300 MG/ML  SOLN
75.0000 mL | Freq: Once | INTRAMUSCULAR | Status: AC | PRN
  Administered 2021-03-11: 75 mL via INTRAVENOUS

## 2021-03-11 MED ORDER — DEXAMETHASONE SODIUM PHOSPHATE 10 MG/ML IJ SOLN
10.0000 mg | Freq: Once | INTRAMUSCULAR | Status: AC
  Administered 2021-03-11: 10 mg via INTRAVENOUS
  Filled 2021-03-11: qty 1

## 2021-03-11 MED ORDER — SODIUM CHLORIDE 0.9% FLUSH
3.0000 mL | Freq: Two times a day (BID) | INTRAVENOUS | Status: DC
  Administered 2021-03-12 – 2021-03-13 (×4): 3 mL via INTRAVENOUS

## 2021-03-11 MED ORDER — ONDANSETRON HCL 4 MG/2ML IJ SOLN
4.0000 mg | Freq: Four times a day (QID) | INTRAMUSCULAR | Status: DC | PRN
  Administered 2021-03-12: 4 mg via INTRAVENOUS
  Filled 2021-03-11: qty 2

## 2021-03-11 MED ORDER — ONDANSETRON HCL 4 MG PO TABS: 4.0000 mg | ORAL_TABLET | Freq: Four times a day (QID) | ORAL | Status: DC | PRN

## 2021-03-11 MED ORDER — DEXAMETHASONE SODIUM PHOSPHATE 4 MG/ML IJ SOLN
4.0000 mg | Freq: Four times a day (QID) | INTRAMUSCULAR | Status: DC
  Administered 2021-03-11 – 2021-03-14 (×11): 4 mg via INTRAVENOUS
  Filled 2021-03-11 (×11): qty 1

## 2021-03-11 MED ORDER — OSMOLITE 1.5 CAL PO LIQD
237.0000 mL | Freq: Four times a day (QID) | ORAL | Status: DC
  Administered 2021-03-12 – 2021-03-13 (×5): 237 mL
  Filled 2021-03-11 (×8): qty 237

## 2021-03-11 MED ORDER — ACETAMINOPHEN 650 MG RE SUPP
650.0000 mg | Freq: Four times a day (QID) | RECTAL | Status: DC | PRN
  Filled 2021-03-11: qty 1

## 2021-03-11 MED ORDER — ATROPINE SULFATE 1 % OP SOLN
1.0000 [drp] | Freq: Two times a day (BID) | OPHTHALMIC | Status: DC
  Administered 2021-03-12 – 2021-03-14 (×6): 1 [drp] via OPHTHALMIC
  Filled 2021-03-11: qty 2

## 2021-03-11 MED ORDER — LOSARTAN POTASSIUM 50 MG PO TABS
50.0000 mg | ORAL_TABLET | Freq: Every day | ORAL | Status: DC
  Administered 2021-03-12 – 2021-03-14 (×3): 50 mg
  Filled 2021-03-11 (×3): qty 1

## 2021-03-11 NOTE — ED Triage Notes (Signed)
Pt came from home via EMS. C/c: increased SOB since yesterday. Per EMS, lung sounds are clear but stridor is present d/t tumor in throat. Pt began chemo last week.  150/90 98 bpm 16 RR 99% on RA GCS of 15, A&O x4  G tube in LLQ

## 2021-03-11 NOTE — ED Provider Notes (Signed)
[facility] DEPT Provider Note   CSN: [id number] Arrival date & time: 03/11/21  1616     History Chief Complaint  Patient presents with   Shortness of Breath    [name] is a 70 y.o. male.   Shortness of Breath Associated symptoms: cough   Associated symptoms: no abdominal pain, no chest pain and no rash      Past Medical History:  Diagnosis Date   Cancer ([place])    Throat cancer 2019   ETOH abuse    Frequent urination    Glaucoma    Hepatitis C virus infection without hepatic coma    dx'ed in 11/2018   History of radiation therapy 05/12/19- 07/06/19   Larynx   Hypertension    Wears denture    upper only; lost lower denture    Patient Active Problem List   Diagnosis Date Noted   Nausea and vomiting 01/24/2021   Shortness of breath 01/10/2021   Mucositis due to antineoplastic therapy 01/10/2021   G tube feedings ([place]) 01/10/2021   Chemotherapy-induced diarrhea 10/30/2020   Port-A-Cath in place 10/23/2020   Cancer of tonsillar fossa ([place]) 08/31/2020   Laryngeal cancer ([place]) 01/06/2020   Metastatic cancer to cervical lymph nodes ([place]) 12/01/2019   Chronic hepatitis C without hepatic coma ([place]) 06/09/2019   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[place]) 05/02/2019   Squamous cell carcinoma of neck 04/01/2019    Past Surgical History:  Procedure Laterality Date   ANKLE SURGERY  2011   right ankle   COLONOSCOPY  02/2019   polyps - Dr Havery Moros   DIRECT LARYNGOSCOPY N/A 04/01/2019   Procedure: DIRECT LARYNGOSCOPY WITH BIOPSY;  Surgeon: Jodi Marble, MD;  Location: [facility];  Service: ENT;  Laterality: N/A;   DIRECT LARYNGOSCOPY N/A 01/06/2020   Procedure: DIRECT LARYNGOSCOPY;  Surgeon: Izora Gala, MD;  Location: [facility];  Service: ENT;  Laterality: N/A;   DIRECT LARYNGOSCOPY N/A 08/13/2020   Procedure: DIRECT LARYNGOSCOPY;  Surgeon: Izora Gala, MD;  Location: [facility];  Service: ENT;  Laterality: N/A;    ESOPHAGOSCOPY N/A 04/01/2019   Procedure: ESOPHAGOSCOPY;  Surgeon: Jodi Marble, MD;  Location: [facility];  Service: ENT;  Laterality: N/A;   EXCISION ORAL TUMOR Right 08/13/2020   Procedure: BIOPSY OF OROPHARYNGEAL MASS;  Surgeon: Izora Gala, MD;  Location: [facility];  Service: ENT;  Laterality: Right;   EYE SURGERY Right    IR CM INJ ANY COLONIC TUBE W/FLUORO  12/20/2020   IR GASTROSTOMY TUBE MOD SED  09/24/2020   IR IMAGING GUIDED PORT INSERTION  09/24/2020   KNEE SURGERY     LARYNGOSCOPY AND BRONCHOSCOPY N/A 04/01/2019   Procedure: BRONCHOSCOPY;  Surgeon: Jodi Marble, MD;  Location: [facility];  Service: ENT;  Laterality: N/A;   RADICAL NECK DISSECTION N/A 01/06/2020   Procedure: RADICAL NECK DISSECTION;  Surgeon: Izora Gala, MD;  Location: [facility] OR;  Service: ENT;  Laterality: N/A;       Family History  Problem Relation Age of Onset   Breast cancer Sister    Colon cancer Brother 86       ????   Cancer Brother     Social History   Tobacco Use   Smoking status: Former    Years: 50.00    Pack years: 0.00    Types: Cigarettes    Quit date: 06/01/2020    Years since quitting: 0.7   Smokeless tobacco: Never  Vaping Use  Vaping Use: Never used  Substance Use Topics   Alcohol use: Yes    Alcohol/week: 4.0 standard drinks    Types: 4 Cans of beer per week    Comment: 40oz beer daily   Drug use: Yes    Types: Cocaine    Comment: none in 2 yrs    Home Medications Prior to Admission medications   Medication Sig Start Date End Date Taking? Authorizing Provider  atropine 1 % ophthalmic solution Place 1 drop into the right eye 2 (two) times a day.     [provider]  cetirizine (ZYRTEC) 10 MG tablet Take 10 mg by mouth daily as needed for allergies.    [provider]  dexamethasone (DECADRON) 4 MG tablet Take 2 tablets (8 mg total) by mouth 2 (two) times daily. Start the day before Taxotere. Then daily after chemo for 2 days. 03/05/21   [name], MD  docusate sodium (COLACE) 50 MG capsule Take 1 capsule (50 mg total) by mouth 2 (two) times daily. 09/25/20   [name], MD  fluconazole (DIFLUCAN) 200 MG tablet Take 1 tablet (200 mg total) by mouth daily. 01/10/21   [name], MD  HYDROcodone-acetaminophen (HYCET) 7.5-325 mg/15 ml solution Take 10 mLs by mouth 4 (four) times daily as needed for moderate pain (Cancer pain). 01/10/21   [name], MD  ibuprofen (ADVIL) 200 MG tablet Take 400 mg by mouth every 6 (six) hours as needed for headache or moderate pain.    [provider]  latanoprost (XALATAN) 0.005 % ophthalmic solution Place 1 drop into the right eye at bedtime.     [provider]  lidocaine-prilocaine (EMLA) cream Apply to affected area once 03/05/21   [name], MD  losartan (COZAAR) 50 MG tablet Take 50 mg by mouth daily. 02/24/20   [provider]  Nutritional Supplements (FEEDING SUPPLEMENT, OSMOLITE 1.5 CAL,) LIQD Give 1 carton Osmolite 1.5 via PEG 4 times daily with 90 mL free water before and after bolus feeding.  Increase to 1-1/2 cartons 4 times daily.  In addition, drink or flush tube with an additional 240 mL free water twice daily.  This provides 2130 cal, 89.4 g protein and 2352 mL free water which is 100% estimated needs. 09/26/20   [name], MD  ondansetron (ZOFRAN) 8 MG tablet Take 1 tablet (8 mg total) by mouth 2 (two) times daily as needed for refractory nausea / vomiting. 03/05/21   [name], MD  prednisoLONE acetate (PRED FORTE) 1 % ophthalmic suspension Place 1 drop into both eyes daily. 07/04/20   [provider]  prochlorperazine (COMPAZINE) 10 MG tablet Take 1 tablet (10 mg total) by mouth every 6 (six) hours as needed (Nausea or vomiting). 03/05/21   [name], MD  tolterodine (DETROL LA) 4 MG 24 hr capsule Take 4 mg by mouth daily. 01/16/19   [provider]  Vitamin D, Ergocalciferol, (DRISDOL) 1.25 MG (50000 UNIT) CAPS  capsule Take 50,000 Units by mouth once a week. 05/22/20   [provider]    Allergies    Patient has no known allergies.  Review of Systems   Review of Systems  Constitutional:  Negative for appetite change and fatigue.  HENT:  Positive for trouble swallowing.   Respiratory:  Positive for cough and shortness of breath.   Cardiovascular:  Negative for chest pain.  Gastrointestinal:  Negative for abdominal pain.  Genitourinary:  Negative for flank pain.  Musculoskeletal:  Negative for back  pain.  Skin:  Negative for rash.  Neurological:  Negative for weakness.   Physical Exam Updated Vital Signs BP (!) 145/104   Pulse 95   Temp 99 F (37.2 C) (Oral)   Resp (!) 23   Ht 5\' 8"  (1.727 m)   Wt 51.7 kg   SpO2 99%   BMI 17.33 kg/m   Physical Exam Vitals and nursing note reviewed.  HENT:     Head: Atraumatic.     Mouth/Throat:     Comments: Possible posterior pharyngeal edema.  May have some mild stridor.  Harsh voice. Neck:     Comments: Radiation changes to neck. Cardiovascular:     Rate and Rhythm: Normal rate.  Pulmonary:     Comments: Mildly harsh breath sounds. Chest:     Chest wall: No mass.  Musculoskeletal:     Cervical back: Neck supple.     Right lower leg: No edema.     Left lower leg: No edema.  Skin:    General: Skin is warm.     Capillary Refill: Capillary refill takes less than 2 seconds.  Neurological:     Mental Status: He is alert and oriented to person, place, and time.    ED Results / Procedures / Treatments   Labs (all labs ordered are listed, but only abnormal results are displayed) Labs Reviewed  COMPREHENSIVE METABOLIC PANEL - Abnormal; Notable for the following components:      Result Value   Sodium 131 (*)    Chloride 92 (*)    Glucose, Bld 108 (*)    Creatinine, Ser 0.53 (*)    AST 14 (*)    All other components within normal limits  CBC WITH DIFFERENTIAL/PLATELET - Abnormal; Notable for the following components:   WBC  2.8 (*)    RBC 3.54 (*)    Hemoglobin 11.0 (*)    HCT 32.8 (*)    Neutro Abs 1.4 (*)    All other components within normal limits  RESP PANEL BY RT-PCR (FLU A&B, COVID) ARPGX2    EKG None  Radiology CT Soft Tissue Neck W Contrast  Result Date: 03/11/2021 CLINICAL DATA:  Worsening shortness of breath since yesterday. Stridor. Tumor in the throat being treated with chemotherapy. EXAM: CT NECK WITH CONTRAST TECHNIQUE: Multidetector CT imaging of the neck was performed using the standard protocol following the bolus administration of intravenous contrast. CONTRAST:  11mL OMNIPAQUE IOHEXOL 300 MG/ML  SOLN COMPARISON:  PET scan 01/31/2021.  CT 07/05/2020. FINDINGS: Pharynx and larynx: Increased edema of the mucosa an the parapharyngeal space throughout the mid upper neck consistent with acute radiation change. There does not appear to be focal tumor responsible for this. This results in airway narrowing and could possibly result in critical narrowing. Salivary glands: Parotid and submandibular glands do not show acute disease. Some volume loss probably due to previous radiation. Thyroid: Normal Lymph nodes: Redemonstration of recurrent malignant lymphadenopathy in the left neck, level 2 and 3. This extends over a length of about 3-4 cm with maximal transverse diameter of 21 mm. Similar appearance to the recent PET scan. Additional lymphadenopathy in the low right supraclavicular region just above and behind the clavicular head, measuring about 18 mm in diameter. No new area of recurrent nodal disease is seen compared to the previous PET scan. Vascular: No acute vascular finding. Limited intracranial: Negative Visualized orbits: Normal Mastoids and visualized paranasal sinuses: Clear Skeleton: Chronic cervical spondylosis. Upper chest: Negative Other: None IMPRESSION: Diffuse pharyngeal and  laryngeal mucosal and submucosal edema probably related to acute radiation injury. The airway is narrow and could  possibly be critically narrowed. I do not see any evidence of recurrent mucosal or submucosal tumor identifiable. Similar appearance of the recurrent malignant lymphadenopathy on the left at level 2 and level 3 and on the right in the low supraclavicular region just above and behind the clavicular head. Electronically Signed   By: Nelson Chimes M.D.   On: 03/11/2021 17:53    Procedures Procedures   Medications Ordered in ED Medications  iohexol (OMNIPAQUE) 300 MG/ML solution 75 mL (75 mLs Intravenous Contrast Given 03/11/21 1722)  dexamethasone (DECADRON) injection 10 mg (10 mg Intravenous Given 03/11/21 1838)    ED Course  I have reviewed the triage vital signs and the nursing notes.  Pertinent labs & imaging results that were available during my care of the patient were reviewed by me and considered in my medical decision making (see chart for details).    MDM Rules/Calculators/A&P                          Patient with more difficulty breathing.  Has known throat cancer.  On new chemotherapy a week ago.  Last couple days more swelling.  Little bit of cough with some mild production.  Change in voice.  CT scan done and showed edema with some airway narrowing.  Some dyspneic but not hypoxia.  Discussed with Dr. Redmond Baseman from ENT.  Will follow.  Can stay at [facility] long however does not need transfer to [facility].  Will round tomorrow but can be contacted sooner if needed.  Have given Decadron.  Infection felt less likely.  Will admit to hospitalist.  Discussed with patient and he would want intubation or trach if needed.  CRITICAL CARE Performed by: Davonna Belling Total critical care time: 30 minutes Critical care time was exclusive of separately billable procedures and treating other patients. Critical care was necessary to treat or prevent imminent or life-threatening deterioration. Critical care was time spent personally by me on the following activities: development of treatment plan with  patient and/or surrogate as well as nursing, discussions with consultants, evaluation of patient's response to treatment, examination of patient, obtaining history from patient or surrogate, ordering and performing treatments and interventions, ordering and review of laboratory studies, ordering and review of radiographic studies, pulse oximetry and re-evaluation of patient's condition.   Final Clinical Impression(s) / ED Diagnoses Final diagnoses:  Laryngeal edema    Rx / DC Orders ED Discharge Orders     None        Davonna Belling, MD 03/11/21 1912

## 2021-03-11 NOTE — H&P (Signed)
History and Physical    HISAO DOO GUR:[id number] DOB: 1950/11/04 DOA: 03/11/2021  PCP: [name], MD  Patient coming from: Home via EMS  I have personally briefly reviewed patient's old medical records in [place]  Chief Complaint: Cough, dyspnea  HPI: [name] is a 70 y.o. male with medical history significant for SCC of right supraglottis s/p RT 2020, concurrent CRT 10/2020, currently on Docetaxel s/p cycle 1, G-tube dependent, alcohol and substance use disorder who presented to the ED for evaluation of worsening cough and dyspnea.  Patient reports several days of worsening cough productive of clear sputum and dyspnea.  He says this feels similar to his prior issues with swelling of his throat since undergoing radiation therapy.  He is G-tube dependent and tube feeds 4 times daily.  He completed 3 days of cycle 1 Docetaxel on 6/23.  ED Course:  Initial vitals showed BP 148/106, pulse 96, RR 18, temp 99.0 F, SPO2 100% on room air.  Labs show sodium 131, potassium 3.8, bicarb 31, BUN 11, creatinine 0.53, serum glucose 108, WBC 2.8, hemoglobin 11.0, platelets 150,000.  SARS-CoV-2 PCR negative.  Influenza A/B PCR negative.  CT soft tissue neck with contrast shows diffuse pharyngeal and laryngeal mucosal and submucosal edema with narrow airway.  Similar appearance of recurrent malignant lymphadenopathy on the left at level 2 and level 3 and on the right in the low supraclavicular region just above and behind the clavicular head noted.  EDP discussed with on-call [facility], Dr. Redmond Baseman who recommended IV steroids and will see in a.m.  The hospitalist service was consulted to admit for further evaluation and management.  Review of Systems: All systems reviewed and are negative except as documented in history of present illness above.   Past Medical History:  Diagnosis Date   Cancer ([place])    Throat cancer 2019   ETOH abuse    Frequent urination    Glaucoma    Hepatitis  C virus infection without hepatic coma    dx'ed in 11/2018   History of radiation therapy 05/12/19- 07/06/19   Larynx   Hypertension    Wears denture    upper only; lost lower denture    Past Surgical History:  Procedure Laterality Date   ANKLE SURGERY  2011   right ankle   COLONOSCOPY  02/2019   polyps - Dr Havery Moros   DIRECT LARYNGOSCOPY N/A 04/01/2019   Procedure: DIRECT LARYNGOSCOPY WITH BIOPSY;  Surgeon: Jodi Marble, MD;  Location: [facility];  Service: ENT;  Laterality: N/A;   DIRECT LARYNGOSCOPY N/A 01/06/2020   Procedure: DIRECT LARYNGOSCOPY;  Surgeon: Izora Gala, MD;  Location: [facility];  Service: ENT;  Laterality: N/A;   DIRECT LARYNGOSCOPY N/A 08/13/2020   Procedure: DIRECT LARYNGOSCOPY;  Surgeon: Izora Gala, MD;  Location: [facility];  Service: ENT;  Laterality: N/A;   ESOPHAGOSCOPY N/A 04/01/2019   Procedure: ESOPHAGOSCOPY;  Surgeon: Jodi Marble, MD;  Location: [facility];  Service: ENT;  Laterality: N/A;   EXCISION ORAL TUMOR Right 08/13/2020   Procedure: BIOPSY OF OROPHARYNGEAL MASS;  Surgeon: Izora Gala, MD;  Location: [facility];  Service: ENT;  Laterality: Right;   EYE SURGERY Right    IR CM INJ ANY COLONIC TUBE W/FLUORO  12/20/2020   IR GASTROSTOMY TUBE MOD SED  09/24/2020   IR IMAGING GUIDED PORT INSERTION  09/24/2020   KNEE SURGERY     LARYNGOSCOPY AND BRONCHOSCOPY N/A 04/01/2019   Procedure: BRONCHOSCOPY;  Surgeon: Jodi Marble, MD;  Location: [facility];  Service: ENT;  Laterality: N/A;   RADICAL NECK DISSECTION N/A 01/06/2020   Procedure: RADICAL NECK DISSECTION;  Surgeon: Izora Gala, MD;  Location: [facility];  Service: ENT;  Laterality: N/A;    Social History:  reports that he quit smoking about 9 months ago. His smoking use included cigarettes. He has never used smokeless tobacco. He reports current alcohol use of about 4.0 standard drinks of alcohol per week. He reports current drug use. Drug: Cocaine.  No Known Allergies  Family  History  Problem Relation Age of Onset   Breast cancer Sister    Colon cancer Brother 30       ????   Cancer Brother      Prior to Admission medications   Medication Sig Start Date End Date Taking? Authorizing Provider  atropine 1 % ophthalmic solution Place 1 drop into the right eye 2 (two) times a day.     [provider]  cetirizine (ZYRTEC) 10 MG tablet Take 10 mg by mouth daily as needed for allergies.    [provider]  dexamethasone (DECADRON) 4 MG tablet Take 2 tablets (8 mg total) by mouth 2 (two) times daily. Start the day before Taxotere. Then daily after chemo for 2 days. 03/05/21   [name], MD  docusate sodium (COLACE) 50 MG capsule Take 1 capsule (50 mg total) by mouth 2 (two) times daily. 09/25/20   [name], MD  fluconazole (DIFLUCAN) 200 MG tablet Take 1 tablet (200 mg total) by mouth daily. 01/10/21   [name], MD  HYDROcodone-acetaminophen (HYCET) 7.5-325 mg/15 ml solution Take 10 mLs by mouth 4 (four) times daily as needed for moderate pain (Cancer pain). 01/10/21   [name], MD  ibuprofen (ADVIL) 200 MG tablet Take 400 mg by mouth every 6 (six) hours as needed for headache or moderate pain.    [provider]  latanoprost (XALATAN) 0.005 % ophthalmic solution Place 1 drop into the right eye at bedtime.     [provider]  lidocaine-prilocaine (EMLA) cream Apply to affected area once 03/05/21   [name], MD  losartan (COZAAR) 50 MG tablet Take 50 mg by mouth daily. 02/24/20   [provider]  Nutritional Supplements (FEEDING SUPPLEMENT, OSMOLITE 1.5 CAL,) LIQD Give 1 carton Osmolite 1.5 via PEG 4 times daily with 90 mL free water before and after bolus feeding.  Increase to 1-1/2 cartons 4 times daily.  In addition, drink or flush tube with an additional 240 mL free water twice daily.  This provides 2130 cal, 89.4 g protein and 2352 mL free water which is 100% estimated needs. 09/26/20   [name], MD  ondansetron (ZOFRAN) 8 MG tablet Take 1 tablet (8 mg total) by mouth 2 (two) times daily as needed for refractory nausea / vomiting. 03/05/21   [name], MD  prednisoLONE acetate (PRED FORTE) 1 % ophthalmic suspension Place 1 drop into both eyes daily. 07/04/20   [provider]  prochlorperazine (COMPAZINE) 10 MG tablet Take 1 tablet (10 mg total) by mouth every 6 (six) hours as needed (Nausea or vomiting). 03/05/21   [name], MD  tolterodine (DETROL LA) 4 MG 24 hr capsule Take 4 mg by mouth daily. 01/16/19   [provider]  Vitamin D, Ergocalciferol, (DRISDOL) 1.25 MG (50000 UNIT) CAPS capsule Take 50,000 Units by mouth once a week. 05/22/20   [provider]    Physical Exam: Vitals:  03/11/21 1900 03/11/21 1930 03/11/21 2000 03/11/21 2030  BP: (!) 156/91 (!) 163/102 (!) 166/113 (!) 145/94  Pulse: 94 (!) 108 99 98  Resp: 18 (!) 23 16 20   Temp:      TempSrc:      SpO2: 98% 100% 99% 98%  Weight:      Height:       Constitutional: Thin man resting in bed with head elevated Eyes: Opacified right pupil, lids and conjunctivae normal ENMT: Hoarse voice with frequent cough productive of clear sputum.  Mucous membranes are dry. Posterior pharynx clear of any exudate or lesions.poor dentition.  Respiratory: Distant breath sounds. No accessory muscle use.  Cardiovascular: Regular rate and rhythm, no murmurs / rubs / gallops. No extremity edema. 2+ pedal pulses. Abdomen: G-tube in place, no tenderness, no masses palpated. Bowel sounds positive.  Musculoskeletal: no clubbing / cyanosis. No joint deformity upper and lower extremities. Good ROM, no contractures. Normal muscle tone.  Skin: no rashes, lesions, ulcers. No induration Neurologic: Sensation intact. Strength 5/5 in all 4.  Psychiatric: Normal judgment and insight. Alert and oriented x 3. Normal mood.   Labs on Admission: I have personally reviewed following labs and imaging  studies  CBC: Recent Labs  Lab 03/05/21 1235 03/11/21 1656  WBC 4.3 2.8*  NEUTROABS 3.0 1.4*  HGB 11.4* 11.0*  HCT 33.0* 32.8*  MCV 90.4 92.7  PLT 161 528   Basic Metabolic Panel: Recent Labs  Lab 03/05/21 1235 03/11/21 1656  NA 135 131*  K 4.3 3.8  CL 97* 92*  CO2 30 31  GLUCOSE 124* 108*  BUN 11 11  CREATININE 0.63 0.53*  CALCIUM 9.2 9.3   GFR: Estimated Creatinine Clearance: 65.5 mL/min (A) (by C-G formula based on SCr of 0.53 mg/dL (L)). Liver Function Tests: Recent Labs  Lab 03/05/21 1235 03/11/21 1656  AST 21 14*  ALT 13 11  ALKPHOS 56 50  BILITOT 0.6 0.8  PROT 8.1 7.6  ALBUMIN 4.2 3.8   No results for input(s): LIPASE, AMYLASE in the last 168 hours. No results for input(s): AMMONIA in the last 168 hours. Coagulation Profile: No results for input(s): INR, PROTIME in the last 168 hours. Cardiac Enzymes: No results for input(s): CKTOTAL, CKMB, CKMBINDEX, TROPONINI in the last 168 hours. BNP (last 3 results) No results for input(s): PROBNP in the last 8760 hours. HbA1C: No results for input(s): HGBA1C in the last 72 hours. CBG: No results for input(s): GLUCAP in the last 168 hours. Lipid Profile: No results for input(s): CHOL, HDL, LDLCALC, TRIG, CHOLHDL, LDLDIRECT in the last 72 hours. Thyroid Function Tests: No results for input(s): TSH, T4TOTAL, FREET4, T3FREE, THYROIDAB in the last 72 hours. Anemia Panel: No results for input(s): VITAMINB12, FOLATE, FERRITIN, TIBC, IRON, RETICCTPCT in the last 72 hours. Urine analysis:    Component Value Date/Time   COLORURINE YELLOW 11/10/2019 0940   APPEARANCEUR CLEAR 11/10/2019 0940   LABSPEC 1.016 11/10/2019 0940   PHURINE 7.0 11/10/2019 0940   GLUCOSEU NEGATIVE 11/10/2019 0940   HGBUR NEGATIVE 11/10/2019 0940   BILIRUBINUR NEGATIVE 11/10/2019 0940   KETONESUR NEGATIVE 11/10/2019 0940   PROTEINUR NEGATIVE 11/10/2019 0940   UROBILINOGEN 4.0 (H) 10/13/2007 1940   NITRITE NEGATIVE 11/10/2019 0940    LEUKOCYTESUR NEGATIVE 11/10/2019 0940    Radiological Exams on Admission: CT Soft Tissue Neck W Contrast  Result Date: 03/11/2021 CLINICAL DATA:  Worsening shortness of breath since yesterday. Stridor. Tumor in the throat being treated with chemotherapy. EXAM: CT NECK WITH  CONTRAST TECHNIQUE: Multidetector CT imaging of the neck was performed using the standard protocol following the bolus administration of intravenous contrast. CONTRAST:  22mL OMNIPAQUE IOHEXOL 300 MG/ML  SOLN COMPARISON:  PET scan 01/31/2021.  CT 07/05/2020. FINDINGS: Pharynx and larynx: Increased edema of the mucosa an the parapharyngeal space throughout the mid upper neck consistent with acute radiation change. There does not appear to be focal tumor responsible for this. This results in airway narrowing and could possibly result in critical narrowing. Salivary glands: Parotid and submandibular glands do not show acute disease. Some volume loss probably due to previous radiation. Thyroid: Normal Lymph nodes: Redemonstration of recurrent malignant lymphadenopathy in the left neck, level 2 and 3. This extends over a length of about 3-4 cm with maximal transverse diameter of 21 mm. Similar appearance to the recent PET scan. Additional lymphadenopathy in the low right supraclavicular region just above and behind the clavicular head, measuring about 18 mm in diameter. No new area of recurrent nodal disease is seen compared to the previous PET scan. Vascular: No acute vascular finding. Limited intracranial: Negative Visualized orbits: Normal Mastoids and visualized paranasal sinuses: Clear Skeleton: Chronic cervical spondylosis. Upper chest: Negative Other: None IMPRESSION: Diffuse pharyngeal and laryngeal mucosal and submucosal edema probably related to acute radiation injury. The airway is narrow and could possibly be critically narrowed. I do not see any evidence of recurrent mucosal or submucosal tumor identifiable. Similar appearance of the  recurrent malignant lymphadenopathy on the left at level 2 and level 3 and on the right in the low supraclavicular region just above and behind the clavicular head. Electronically Signed   By: Nelson Chimes M.D.   On: 03/11/2021 17:53    EKG: Not performed.  Assessment/Plan Principal Problem:   Laryngeal edema Active Problems:   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[facility])   G tube feedings ([place])   [name] is a 70 y.o. male with medical history significant for SCC of right supraglottis s/p RT 2020, concurrent CRT 10/2020, currently on Docetaxel s/p cycle 1, G-tube dependent, alcohol and substance use disorder who is admitted with laryngeal edema.  Diffuse pharyngeal and laryngeal edema G-tube dependent SCC of right supraglottis/laryngeal cancer s/p RT 2020, concurrent CRT 10/2020, currently on Docetaxel s/p cycle 1: -[facility], Dr. Redmond Baseman to see in a.m. Call overnight if needed. -Continue IV Decadron 4 mg every 6 hours -Nothing by mouth, continue tube feeds  Hypertension: Continue losartan.  Leukopenia: Likely chemotherapy related.  Alcohol use: Reports drinking 40 ounce beer daily.  Continue CIWA checks.  DVT prophylaxis: Lovenox Code Status: Full code, confirmed with patient on admission Family Communication: Discussed with patient, he has discussed with family Disposition Plan: From home, dispo pending clinical progress Consults called: [facility] Level of care: Stepdown Admission status:  Status is: Inpatient  Remains inpatient appropriate because:IV treatments appropriate due to intensity of illness or inability to take PO and Inpatient level of care appropriate due to severity of illness  Dispo: The patient is from: Home              Anticipated d/c is to: Home              Patient currently is not medically stable to d/c.   Difficult to place patient No  Zada Finders MD [facility]  If 7PM-7AM, please contact  night-coverage [email or web address]  03/11/2021, 8:54 PM

## 2021-03-12 ENCOUNTER — Encounter (HOSPITAL_COMMUNITY): Payer: Self-pay | Admitting: Internal Medicine

## 2021-03-12 LAB — CBC
HCT: 34.3 % — ABNORMAL LOW (ref 39.0–52.0)
Hemoglobin: 11.5 g/dL — ABNORMAL LOW (ref 13.0–17.0)
MCH: 31.2 pg (ref 26.0–34.0)
MCHC: 33.5 g/dL (ref 30.0–36.0)
MCV: 93 fL (ref 80.0–100.0)
Platelets: 176 10*3/uL (ref 150–400)
RBC: 3.69 MIL/uL — ABNORMAL LOW (ref 4.22–5.81)
RDW: 12.8 % (ref 11.5–15.5)
WBC: 4 10*3/uL (ref 4.0–10.5)
nRBC: 0 % (ref 0.0–0.2)

## 2021-03-12 LAB — BASIC METABOLIC PANEL
Anion gap: 10 (ref 5–15)
BUN: 12 mg/dL (ref 8–23)
CO2: 29 mmol/L (ref 22–32)
Calcium: 9.3 mg/dL (ref 8.9–10.3)
Chloride: 93 mmol/L — ABNORMAL LOW (ref 98–111)
Creatinine, Ser: 0.49 mg/dL — ABNORMAL LOW (ref 0.61–1.24)
GFR, Estimated: >60 mL/min (ref >60)
Glucose, Bld: 138 mg/dL — ABNORMAL HIGH (ref 70–99)
Potassium: 4.3 mmol/L (ref 3.5–5.1)
Sodium: 132 mmol/L — ABNORMAL LOW (ref 135–145)

## 2021-03-12 LAB — HIV ANTIBODY (ROUTINE TESTING W REFLEX)
HIV Screen 4th Generation wRfx: NONREACTIVE
HIV Screen 4th Generation wRfx: NONREACTIVE

## 2021-03-12 LAB — MRSA PCR SCREENING: MRSA by PCR: NEGATIVE

## 2021-03-12 MED ORDER — CHLORHEXIDINE GLUCONATE CLOTH 2 % EX PADS
6.0000 | MEDICATED_PAD | Freq: Every day | CUTANEOUS | Status: DC
  Administered 2021-03-13 – 2021-03-14 (×2): 6 via TOPICAL

## 2021-03-12 MED ORDER — MORPHINE SULFATE (PF) 2 MG/ML IV SOLN
1.0000 mg | INTRAVENOUS | Status: AC | PRN
Start: 2021-03-12 — End: 2021-03-13
  Administered 2021-03-12 – 2021-03-13 (×3): 1 mg via INTRAVENOUS
  Filled 2021-03-12 (×3): qty 1

## 2021-03-12 MED ORDER — ORAL CARE MOUTH RINSE
15.0000 mL | Freq: Two times a day (BID) | OROMUCOSAL | Status: DC
  Administered 2021-03-12 – 2021-03-14 (×5): 15 mL via OROMUCOSAL

## 2021-03-12 NOTE — ED Notes (Signed)
Patient placed on 2 L  due to dropping to 88% room air while sleep. Patient o2 sat went right back up to 93%.

## 2021-03-12 NOTE — Consult Note (Signed)
Reason for Consult: Dyspnea Referring Physician: Hospitalist  [name] [name] is an 70 y.o. male.  HPI: 70 year old male with right pyriform sinus cancer representing a second head and neck primary following radiation treatments in 2020 and concurrent chemo/XRT completed in February.  One week ago, he received his first palliative docetaxel dose.  Over the weekend, he felt more difficulty breathing with tightness in the throat and more cough.  He came to the ER last night and was admitted after an updated CT scan.  He has been treated with steroids and feels 50% better today.  He has a G-tube for nutrition.  Past Medical History:  Diagnosis Date   Cancer ([place])    Throat cancer 2019   ETOH abuse    Frequent urination    Glaucoma    Hepatitis C virus infection without hepatic coma    dx'ed in 11/2018   History of radiation therapy 05/12/19- 07/06/19   Larynx   Hypertension    Wears denture    upper only; lost lower denture    Past Surgical History:  Procedure Laterality Date   ANKLE SURGERY  2011   right ankle   COLONOSCOPY  02/2019   polyps - Dr Havery Moros   DIRECT LARYNGOSCOPY N/A 04/01/2019   Procedure: DIRECT LARYNGOSCOPY WITH BIOPSY;  Surgeon: Jodi Marble, MD;  Location: [facility];  Service: ENT;  Laterality: N/A;   DIRECT LARYNGOSCOPY N/A 01/06/2020   Procedure: DIRECT LARYNGOSCOPY;  Surgeon: Izora Gala, MD;  Location: [facility];  Service: ENT;  Laterality: N/A;   DIRECT LARYNGOSCOPY N/A 08/13/2020   Procedure: DIRECT LARYNGOSCOPY;  Surgeon: Izora Gala, MD;  Location: [facility];  Service: ENT;  Laterality: N/A;   ESOPHAGOSCOPY N/A 04/01/2019   Procedure: ESOPHAGOSCOPY;  Surgeon: Jodi Marble, MD;  Location: [facility];  Service: ENT;  Laterality: N/A;   EXCISION ORAL TUMOR Right 08/13/2020   Procedure: BIOPSY OF OROPHARYNGEAL MASS;  Surgeon: Izora Gala, MD;  Location: [facility];  Service: ENT;  Laterality: Right;   EYE SURGERY Right    IR CM INJ ANY  COLONIC TUBE W/FLUORO  12/20/2020   IR GASTROSTOMY TUBE MOD SED  09/24/2020   IR IMAGING GUIDED PORT INSERTION  09/24/2020   KNEE SURGERY     LARYNGOSCOPY AND BRONCHOSCOPY N/A 04/01/2019   Procedure: BRONCHOSCOPY;  Surgeon: Jodi Marble, MD;  Location: [facility];  Service: ENT;  Laterality: N/A;   RADICAL NECK DISSECTION N/A 01/06/2020   Procedure: RADICAL NECK DISSECTION;  Surgeon: Izora Gala, MD;  Location: [facility];  Service: ENT;  Laterality: N/A;    Family History  Problem Relation Age of Onset   Breast cancer Sister    Colon cancer Brother 52       ????   Cancer Brother     Social History:  reports that he quit smoking about 9 months ago. His smoking use included cigarettes. He has never used smokeless tobacco. He reports current alcohol use of about 4.0 standard drinks of alcohol per week. He reports current drug use. Drug: Cocaine.  Allergies: No Known Allergies  Medications: I have reviewed the patient's current medications.  Results for orders placed or performed during the hospital encounter of 03/11/21 (from the past 48 hour(s))  Comprehensive metabolic panel     Status: Abnormal   Collection Time: 03/11/21  4:56 PM  Result Value Ref Range   Sodium 131 (L) 135 - 145 mmol/L   Potassium 3.8 3.5 - 5.1 mmol/L  Chloride 92 (L) 98 - 111 mmol/L   CO2 31 22 - 32 mmol/L   Glucose, Bld 108 (H) 70 - 99 mg/dL    Comment: Glucose reference range applies only to samples taken after fasting for at least 8 hours.   BUN 11 8 - 23 mg/dL   Creatinine, Ser 0.53 (L) 0.61 - 1.24 mg/dL   Calcium 9.3 8.9 - 10.3 mg/dL   Total Protein 7.6 6.5 - 8.1 g/dL   Albumin 3.8 3.5 - 5.0 g/dL   AST 14 (L) 15 - 41 U/L   ALT 11 0 - 44 U/L   Alkaline Phosphatase 50 38 - 126 U/L   Total Bilirubin 0.8 0.3 - 1.2 mg/dL   GFR, Estimated >60 >60 mL/min    Comment: (NOTE) Calculated using the CKD-EPI Creatinine Equation (2021)    Anion gap 8 5 - 15    Comment: Performed at [facility], [place] [address], [place], [place] [address]  CBC with Differential     Status: Abnormal   Collection Time: 03/11/21  4:56 PM  Result Value Ref Range   WBC 2.8 (L) 4.0 - 10.5 K/uL   RBC 3.54 (L) 4.22 - 5.81 MIL/uL   Hemoglobin 11.0 (L) 13.0 - 17.0 g/dL   HCT 32.8 (L) 39.0 - 52.0 %   MCV 92.7 80.0 - 100.0 fL   MCH 31.1 26.0 - 34.0 pg   MCHC 33.5 30.0 - 36.0 g/dL   RDW 12.8 11.5 - 15.5 %   Platelets 150 150 - 400 K/uL   nRBC 0.0 0.0 - 0.2 %   Neutrophils Relative % 51 %   Neutro Abs 1.4 (L) 1.7 - 7.7 K/uL   Lymphocytes Relative 24 %   Lymphs Abs 0.7 0.7 - 4.0 K/uL   Monocytes Relative 22 %   Monocytes Absolute 0.6 0.1 - 1.0 K/uL   Eosinophils Relative 1 %   Eosinophils Absolute 0.0 0.0 - 0.5 K/uL   Basophils Relative 1 %   Basophils Absolute 0.0 0.0 - 0.1 K/uL   Immature Granulocytes 1 %   Abs Immature Granulocytes 0.02 0.00 - 0.07 K/uL    Comment: Performed at [facility], [place] [address] [place], [place] [address]  Resp Panel by RT-PCR (Flu A&B, Covid) Nasopharyngeal Swab     Status: None   Collection Time: 03/11/21  6:45 PM   Specimen: Nasopharyngeal Swab; Nasopharyngeal(NP) swabs in vial transport medium  Result Value Ref Range   SARS Coronavirus 2 by RT PCR NEGATIVE NEGATIVE    Comment: (NOTE) SARS-CoV-2 target nucleic acids are NOT DETECTED.  The SARS-CoV-2 RNA is generally detectable in upper respiratory specimens during the acute phase of infection. The lowest concentration of SARS-CoV-2 viral copies this assay can detect is 138 copies/mL. A negative result does not preclude SARS-Cov-2 infection and should not be used as the sole basis for treatment or other patient management decisions. A negative result may occur with  improper specimen collection/handling, submission of specimen other than nasopharyngeal swab, presence of viral mutation(s) within the areas targeted by this assay, and inadequate number of viral copies(<138 copies/mL). A negative  result must be combined with clinical observations, patient history, and epidemiological information. The expected result is Negative.  Fact Sheet for Patients:  EntrepreneurPulse.com.au  Fact Sheet for Healthcare Providers:  IncredibleEmployment.be  This test is no t yet approved or cleared by the [place] FDA and  has been authorized for detection and/or diagnosis of SARS-CoV-2 by FDA  under an Emergency Use Authorization (EUA). This EUA will remain  in effect (meaning this test can be used) for the duration of the COVID-19 declaration under Section 564(b)(1) of the Act, 21 U.S.C.section 360bbb-3(b)(1), unless the authorization is terminated  or revoked sooner.       Influenza A by PCR NEGATIVE NEGATIVE   Influenza B by PCR NEGATIVE NEGATIVE    Comment: (NOTE) The Xpert Xpress SARS-CoV-2/FLU/RSV plus assay is intended as an aid in the diagnosis of influenza from Nasopharyngeal swab specimens and should not be used as a sole basis for treatment. Nasal washings and aspirates are unacceptable for Xpert Xpress SARS-CoV-2/FLU/RSV testing.  Fact Sheet for Patients: EntrepreneurPulse.com.au  Fact Sheet for Healthcare Providers: IncredibleEmployment.be  This test is not yet approved or cleared by the [place] FDA and has been authorized for detection and/or diagnosis of SARS-CoV-2 by FDA under an Emergency Use Authorization (EUA). This EUA will remain in effect (meaning this test can be used) for the duration of the COVID-19 declaration under Section 564(b)(1) of the Act, 21 U.S.C. section 360bbb-3(b)(1), unless the authorization is terminated or revoked.  Performed at [facility], [place] [address], [place], [place] [address]   HIV Antibody (routine testing w rflx)     Status: None   Collection Time: 03/11/21  8:00 PM  Result Value Ref Range   HIV Screen 4th Generation wRfx Non  Reactive Non Reactive    Comment: Performed at [facility], [place] [address], [place], [place] [address]  HIV Antibody (routine testing w rflx)     Status: None   Collection Time: 03/12/21  5:28 AM  Result Value Ref Range   HIV Screen 4th Generation wRfx Non Reactive Non Reactive    Comment: Performed at [facility], [place] [address], [place], [place] [address]  Basic metabolic panel     Status: Abnormal   Collection Time: 03/12/21  5:28 AM  Result Value Ref Range   Sodium 132 (L) 135 - 145 mmol/L   Potassium 4.3 3.5 - 5.1 mmol/L   Chloride 93 (L) 98 - 111 mmol/L   CO2 29 22 - 32 mmol/L   Glucose, Bld 138 (H) 70 - 99 mg/dL    Comment: Glucose reference range applies only to samples taken after fasting for at least 8 hours.   BUN 12 8 - 23 mg/dL   Creatinine, Ser 0.49 (L) 0.61 - 1.24 mg/dL   Calcium 9.3 8.9 - 10.3 mg/dL   GFR, Estimated >60 >60 mL/min    Comment: (NOTE) Calculated using the CKD-EPI Creatinine Equation (2021)    Anion gap 10 5 - 15    Comment: Performed at [facility], [place] [address], [place], [place] [address]  CBC     Status: Abnormal   Collection Time: 03/12/21  5:28 AM  Result Value Ref Range   WBC 4.0 4.0 - 10.5 K/uL   RBC 3.69 (L) 4.22 - 5.81 MIL/uL   Hemoglobin 11.5 (L) 13.0 - 17.0 g/dL   HCT 34.3 (L) 39.0 - 52.0 %   MCV 93.0 80.0 - 100.0 fL   MCH 31.2 26.0 - 34.0 pg   MCHC 33.5 30.0 - 36.0 g/dL   RDW 12.8 11.5 - 15.5 %   Platelets 176 150 - 400 K/uL   nRBC 0.0 0.0 - 0.2 %    Comment: Performed at [facility], [place] [address] [place], [place] [address]    CT Soft Tissue  Neck W Contrast  Result Date: 03/11/2021 CLINICAL DATA:  Worsening shortness of breath since yesterday. Stridor. Tumor in the throat being treated with chemotherapy. EXAM: CT NECK WITH CONTRAST TECHNIQUE: Multidetector CT imaging of the neck was performed using the standard protocol following the bolus administration of intravenous  contrast. CONTRAST:  66mL OMNIPAQUE IOHEXOL 300 MG/ML  SOLN COMPARISON:  PET scan 01/31/2021.  CT 07/05/2020. FINDINGS: Pharynx and larynx: Increased edema of the mucosa an the parapharyngeal space throughout the mid upper neck consistent with acute radiation change. There does not appear to be focal tumor responsible for this. This results in airway narrowing and could possibly result in critical narrowing. Salivary glands: Parotid and submandibular glands do not show acute disease. Some volume loss probably due to previous radiation. Thyroid: Normal Lymph nodes: Redemonstration of recurrent malignant lymphadenopathy in the left neck, level 2 and 3. This extends over a length of about 3-4 cm with maximal transverse diameter of 21 mm. Similar appearance to the recent PET scan. Additional lymphadenopathy in the low right supraclavicular region just above and behind the clavicular head, measuring about 18 mm in diameter. No new area of recurrent nodal disease is seen compared to the previous PET scan. Vascular: No acute vascular finding. Limited intracranial: Negative Visualized orbits: Normal Mastoids and visualized paranasal sinuses: Clear Skeleton: Chronic cervical spondylosis. Upper chest: Negative Other: None IMPRESSION: Diffuse pharyngeal and laryngeal mucosal and submucosal edema probably related to acute radiation injury. The airway is narrow and could possibly be critically narrowed. I do not see any evidence of recurrent mucosal or submucosal tumor identifiable. Similar appearance of the recurrent malignant lymphadenopathy on the left at level 2 and level 3 and on the right in the low supraclavicular region just above and behind the clavicular head. Electronically Signed   By: Nelson Chimes M.D.   On: 03/11/2021 17:53    Review of Systems  Respiratory:  Positive for cough and shortness of breath.   All other systems reviewed and are negative. Blood pressure 119/69, pulse (!) 103, temperature 98.1 F  (36.7 C), temperature source Oral, resp. rate (!) 25, height 5\' 8"  (1.727 m), weight 51.7 kg, SpO2 100 %. Physical Exam Constitutional:      Appearance: He is well-developed.     Comments: Cachectic  HENT:     Head: Normocephalic and atraumatic.     Right Ear: External ear normal.     Left Ear: External ear normal.     Nose: Nose normal.     Mouth/Throat:     Mouth: Mucous membranes are moist.     Pharynx: Oropharynx is clear.     Comments: Moderate hoarseness.  Edentulous. Eyes:     Extraocular Movements: Extraocular movements intact.     Conjunctiva/sclera: Conjunctivae normal.     Pupils: Pupils are equal, round, and reactive to light.  Cardiovascular:     Rate and Rhythm: Normal rate.  Pulmonary:     Effort: Pulmonary effort is normal.     Breath sounds: No stridor.  Musculoskeletal:     Cervical back: Normal range of motion.  Skin:    General: Skin is warm and dry.  Neurological:     General: No focal deficit present.     Mental Status: He is alert and oriented to person, place, and time.  Psychiatric:        Mood and Affect: Mood normal.        Behavior: Behavior normal.        Thought  Content: Thought content normal.        Judgment: Judgment normal.    Assessment/Plan: Laryngeal cancer, right vocal fold paralysis, and laryngeal edema  Fiberoptic exam of the larynx was performed at the bedside, see procedure note.  The right hemilarynx is fixated and the glottis is narrow.  Secretions pass through the larynx with some cough response.  We discussed tracheostomy but he is feeling 50% better today after less than 24 hours of steroid therapy.  We agreed to continue medical therapy with potential discharge in the next day or two if he continues to improve.  Tracheostomy as part of palliative care could be considered if he worsens.  Melida Quitter 03/12/2021, 12:11 PM

## 2021-03-12 NOTE — Procedures (Signed)
Preop diagnosis: Laryngeal cancer Postop diagnosis: same, right vocal fold paralysis Procedure: Transnasal fiberoptic laryngoscopy Surgeon: Redmond Baseman Anesth: Topical with 4% lidocaine Compl: None Findings: Right hemilarynx fixated with some bulk and white mucosal appearance laterally toward the pyriform sinus.  The right pyriform sinus appears full.  The left vocal fold moves but the glottis remains narrow.  Secretions pass through the glottis with some cough response. Description:  After discussing risks, benefits, and alternatives, the patient was placed in a seated position and the right nasal passage was sprayed with topical anesthetic.  The fiberoptic scope was passed through the right nasal passage to view the pharynx and larynx.  Findings are noted above.  The scope was then removed and he was returned to nursing care in stable condition.

## 2021-03-12 NOTE — Progress Notes (Addendum)
MD made aware of purulent drainage & 8/10 pain from pt's chronic PEG tube site.

## 2021-03-12 NOTE — Progress Notes (Signed)
NUTRITION NOTE  Secure chat message received from Pharmacist about a difference in TF order between outpatient order and what is currently ordered for inpatient admission.  Order in place for 1 carton (237 ml) Osmolite 1.5 QID.  Will maintain this order for today and RD will see patient for full assessment 6/29 and make adjustments to TF order, if warranted, at that time.      Jarome Matin, MS, RD, LDN, CNSC Inpatient Clinical Dietitian RD pager # available in [place]  After hours/weekend pager # available in [name]

## 2021-03-12 NOTE — Progress Notes (Signed)
PROGRESS NOTE    [name]  HYQ:[id number] DOB: 1950-10-18 DOA: 03/11/2021 PCP: [name], MD   Chief Complain: Cough, dyspnea  Brief Narrative: Patient is a 70 year old male with history of squamous cell carcinoma of right supraglottis status post ration therapy in 2020, concurrent chemoradiation on 2/22, currently G-tube dependent, history of alcohol/substance abuse who presented to the emergency department with complaints of worsening cough and dyspnea.  Also reported productive cough.  On presentation he was hemodynamically stable.  CT soft tissue neck with contrast showed diffuse pharyngeal and laryngeal mucosa/submucosal edema with airway narrowing.  ED physician discussed with [facility], Dr. Redmond Baseman who recommended IV steroids and will consult.  Assessment & Plan:   Principal Problem:   Laryngeal edema Active Problems:   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HCC)   G tube feedings (HCC)   Diffuse pharyngeal/laryngeal edema: Continue steroids.  Unknown etiology.  ENT following.  N.p.o..  No wheezing auscultated today.  His cough is better.  He is symptomatically much better today.  Plan to change the steroids to oral and discharge tomorrow to home if no further recommendation from ENT.  We will monitor him on room air. Patient had previous similar episode after chemotherapy.  Most likely pharyngeal/laryngeal edema was associated with chemo.  Laryngeal cancer: History of SCC of right supraglottis/laryngeal cancer s/p RT 2020, concurrent CRT 10/2020, currently on Docetaxel s/p cycle 1.  We recommend to follow-up with ENT/oncology as an outpatient.  Currently G-tube dependent, will continue tube feeding.  Hypertension: Currently blood stable.  On losartan at home  Leukopenia: Most likely secondary to chemotherapy.  Continue to monitor  Chronic alcohol use: Reports drinking 40 ounce of beer daily.  Currently not on withdrawal.         DVT prophylaxis:  Lovenox Code Status:Full  Family Communication:  Status is: Inpatient  Remains inpatient appropriate because:IV treatments appropriate due to intensity of illness or inability to take PO  Dispo: The patient is from: Home              Anticipated d/c is to: Home              Patient currently is not medically stable to d/c.   Difficult to place patient No     Consultants: ENT  Procedures:None  Antimicrobials:  Anti-infectives (From admission, onward)    None       Subjective:  Patient seen and examined the bedside this morning.  Hemodynamically stable.  Comfortable.  Denies any complaints today, cough is better.     Objective: Vitals:   03/11/21 2230 03/11/21 2300 03/12/21 0031 03/12/21 0330  BP: (!) 141/95 (!) 153/109 (!) 140/99 (!) 120/98  Pulse: 91 (!) 108 99 99  Resp: 18  18 17   Temp:      TempSrc:      SpO2: 97% 100% 99% 95%  Weight:      Height:       No intake or output data in the 24 hours ending 03/12/21 0729 Filed Weights   03/11/21 1638  Weight: 51.7 kg    Examination:  General exam: Appears calm and comfortable ,Not in distress,thin built HEENT:Oral mucosa moist, Ear/Nose normal on gross exam Respiratory system: Bilateral equal air entry, normal vesicular breath sounds, no wheezes or crackles  Cardiovascular system: S1 & S2 heard, RRR. No JVD, murmurs, rubs, gallops or clicks. No pedal edema. Gastrointestinal system: Abdomen is nondistended, soft and nontender. No organomegaly or masses felt. Normal bowel  sounds heard. Central nervous system: Alert and oriented. No focal neurological deficits. Extremities: No edema, no clubbing ,no cyanosis. Skin: No rashes, lesions or ulcers,no icterus ,no pallor   Data Reviewed: I have personally reviewed following labs and imaging studies  CBC: Recent Labs  Lab 03/05/21 1235 03/11/21 1656 03/12/21 0528  WBC 4.3 2.8* 4.0  NEUTROABS 3.0 1.4*  --   HGB 11.4* 11.0* 11.5*  HCT 33.0* 32.8* 34.3*  MCV  90.4 92.7 93.0  PLT 161 150 703   Basic Metabolic Panel: Recent Labs  Lab 03/05/21 1235 03/11/21 1656 03/12/21 0528  NA 135 131* 132*  K 4.3 3.8 4.3  CL 97* 92* 93*  CO2 30 31 29   GLUCOSE 124* 108* 138*  BUN 11 11 12   CREATININE 0.63 0.53* 0.49*  CALCIUM 9.2 9.3 9.3   GFR: Estimated Creatinine Clearance: 65.5 mL/min (A) (by C-G formula based on SCr of 0.49 mg/dL (L)). Liver Function Tests: Recent Labs  Lab 03/05/21 1235 03/11/21 1656  AST 21 14*  ALT 13 11  ALKPHOS 56 50  BILITOT 0.6 0.8  PROT 8.1 7.6  ALBUMIN 4.2 3.8   No results for input(s): LIPASE, AMYLASE in the last 168 hours. No results for input(s): AMMONIA in the last 168 hours. Coagulation Profile: No results for input(s): INR, PROTIME in the last 168 hours. Cardiac Enzymes: No results for input(s): CKTOTAL, CKMB, CKMBINDEX, TROPONINI in the last 168 hours. BNP (last 3 results) No results for input(s): PROBNP in the last 8760 hours. HbA1C: No results for input(s): HGBA1C in the last 72 hours. CBG: No results for input(s): GLUCAP in the last 168 hours. Lipid Profile: No results for input(s): CHOL, HDL, LDLCALC, TRIG, CHOLHDL, LDLDIRECT in the last 72 hours. Thyroid Function Tests: No results for input(s): TSH, T4TOTAL, FREET4, T3FREE, THYROIDAB in the last 72 hours. Anemia Panel: No results for input(s): VITAMINB12, FOLATE, FERRITIN, TIBC, IRON, RETICCTPCT in the last 72 hours. Sepsis Labs: No results for input(s): PROCALCITON, LATICACIDVEN in the last 168 hours.  Recent Results (from the past 240 hour(s))  Resp Panel by RT-PCR (Flu A&B, Covid) Nasopharyngeal Swab     Status: None   Collection Time: 03/11/21  6:45 PM   Specimen: Nasopharyngeal Swab; Nasopharyngeal(NP) swabs in vial transport medium  Result Value Ref Range Status   SARS Coronavirus 2 by RT PCR NEGATIVE NEGATIVE Final    Comment: (NOTE) SARS-CoV-2 target nucleic acids are NOT DETECTED.  The SARS-CoV-2 RNA is generally detectable in  upper respiratory specimens during the acute phase of infection. The lowest concentration of SARS-CoV-2 viral copies this assay can detect is 138 copies/mL. A negative result does not preclude SARS-Cov-2 infection and should not be used as the sole basis for treatment or other patient management decisions. A negative result may occur with  improper specimen collection/handling, submission of specimen other than nasopharyngeal swab, presence of viral mutation(s) within the areas targeted by this assay, and inadequate number of viral copies(<138 copies/mL). A negative result must be combined with clinical observations, patient history, and epidemiological information. The expected result is Negative.  Fact Sheet for Patients:  EntrepreneurPulse.com.au  Fact Sheet for Healthcare Providers:  IncredibleEmployment.be  This test is no t yet approved or cleared by the [place] FDA and  has been authorized for detection and/or diagnosis of SARS-CoV-2 by FDA under an Emergency Use Authorization (EUA). This EUA will remain  in effect (meaning this test can be used) for the duration of the COVID-19 declaration under Section 564(b)(1)  of the Act, 21 U.S.C.section 360bbb-3(b)(1), unless the authorization is terminated  or revoked sooner.       Influenza A by PCR NEGATIVE NEGATIVE Final   Influenza B by PCR NEGATIVE NEGATIVE Final    Comment: (NOTE) The Xpert Xpress SARS-CoV-2/FLU/RSV plus assay is intended as an aid in the diagnosis of influenza from Nasopharyngeal swab specimens and should not be used as a sole basis for treatment. Nasal washings and aspirates are unacceptable for Xpert Xpress SARS-CoV-2/FLU/RSV testing.  Fact Sheet for Patients: EntrepreneurPulse.com.au  Fact Sheet for Healthcare Providers: IncredibleEmployment.be  This test is not yet approved or cleared by the [place] FDA and has been  authorized for detection and/or diagnosis of SARS-CoV-2 by FDA under an Emergency Use Authorization (EUA). This EUA will remain in effect (meaning this test can be used) for the duration of the COVID-19 declaration under Section 564(b)(1) of the Act, 21 U.S.C. section 360bbb-3(b)(1), unless the authorization is terminated or revoked.  Performed at [facility], [place] [address], [place],  [address]          Radiology Studies: CT Soft Tissue Neck W Contrast  Result Date: 03/11/2021 CLINICAL DATA:  Worsening shortness of breath since yesterday. Stridor. Tumor in the throat being treated with chemotherapy. EXAM: CT NECK WITH CONTRAST TECHNIQUE: Multidetector CT imaging of the neck was performed using the standard protocol following the bolus administration of intravenous contrast. CONTRAST:  5mL OMNIPAQUE IOHEXOL 300 MG/ML  SOLN COMPARISON:  PET scan 01/31/2021.  CT 07/05/2020. FINDINGS: Pharynx and larynx: Increased edema of the mucosa an the parapharyngeal space throughout the mid upper neck consistent with acute radiation change. There does not appear to be focal tumor responsible for this. This results in airway narrowing and could possibly result in critical narrowing. Salivary glands: Parotid and submandibular glands do not show acute disease. Some volume loss probably due to previous radiation. Thyroid: Normal Lymph nodes: Redemonstration of recurrent malignant lymphadenopathy in the left neck, level 2 and 3. This extends over a length of about 3-4 cm with maximal transverse diameter of 21 mm. Similar appearance to the recent PET scan. Additional lymphadenopathy in the low right supraclavicular region just above and behind the clavicular head, measuring about 18 mm in diameter. No new area of recurrent nodal disease is seen compared to the previous PET scan. Vascular: No acute vascular finding. Limited intracranial: Negative Visualized orbits: Normal Mastoids and  visualized paranasal sinuses: Clear Skeleton: Chronic cervical spondylosis. Upper chest: Negative Other: None IMPRESSION: Diffuse pharyngeal and laryngeal mucosal and submucosal edema probably related to acute radiation injury. The airway is narrow and could possibly be critically narrowed. I do not see any evidence of recurrent mucosal or submucosal tumor identifiable. Similar appearance of the recurrent malignant lymphadenopathy on the left at level 2 and level 3 and on the right in the low supraclavicular region just above and behind the clavicular head. Electronically Signed   By: Nelson Chimes M.D.   On: 03/11/2021 17:53        Scheduled Meds:  atropine  1 drop Right Eye BID   Chlorhexidine Gluconate Cloth  6 each Topical Daily   dexamethasone (DECADRON) injection  4 mg Intravenous Q6H   enoxaparin (LOVENOX) injection  40 mg Subcutaneous Q24H   feeding supplement (OSMOLITE 1.5 CAL)  237 mL Per Tube QID   losartan  50 mg Per Tube Daily   mouth rinse  15 mL Mouth Rinse BID   prednisoLONE acetate  1 drop Right  Eye QID   sodium chloride flush  3 mL Intravenous Q12H   Continuous Infusions:   LOS: 1 day    Time spent:35 mins. More than 50% of that time was spent in counseling and/or coordination of care.      Shelly Coss, MD [facility] P6/28/2022, 7:29 AM

## 2021-03-12 NOTE — TOC Initial Note (Signed)
Transition of Care [facility]) - Initial/Assessment Note    Patient Details  Name: [name] MRN: [id number] Date of Birth: [birth year]  [facility] of [facility]) CM/SW Contact:    Leeroy Cha, RN Phone Number: 03/12/2021, 9:03 AM  Clinical Narrative:                 70 y.o. male with medical history significant for SCC of right supraglottis s/p RT 2020, concurrent CRT 10/2020, currently on Docetaxel s/p cycle 1, G-tube dependent, alcohol and substance use disorder who presented to the ED for evaluation of worsening cough and dyspnea.   Patient reports several days of worsening cough productive of clear sputum and dyspnea.  He says this feels similar to his prior issues with swelling of his throat since undergoing radiation therapy.  He is G-tube dependent and tube feeds 4 times daily.  He completed 3 days of cycle 1 Docetaxel on 6/23.   ED Course:  Initial vitals showed BP 148/106, pulse 96, RR 18, temp 99.0 F, SPO2 100% on room air.   Labs show sodium 131, potassium 3.8, bicarb 31, BUN 11, creatinine 0.53, serum glucose 108, WBC 2.8, hemoglobin 11.0, platelets 150,000.  SARS-CoV-2 PCR negative.  Influenza A/B PCR negative.   CT soft tissue neck with contrast shows diffuse pharyngeal and laryngeal mucosal and submucosal edema with narrow airway.  Similar appearance of recurrent malignant lymphadenopathy on the left at level 2 and level 3 and on the right in the low supraclavicular region just above and behind the clavicular head noted.   EDP discussed with on-call [facility], Dr. Redmond Baseman who recommended IV steroids and will see in a.m.  The hospitalist service was consulted to admit for further evaluation and management. TOC PLAN OF CARE: Following for progression and toc needs.  From home lives by himself does have family in the area. Nlo toc need present at this time. Expected Discharge Plan: Home/Self Care Barriers to Discharge: Continued Medical Work up   Patient Goals and CMS  Choice Patient states their goals for this hospitalization and ongoing recovery are:: to go home CMS Medicare.gov Compare Post Acute Care list provided to:: Patient    Expected Discharge Plan and Services Expected Discharge Plan: Home/Self Care   Discharge Planning Services: CM Consult   Living arrangements for the past 2 months: Single Family Home                                      Prior Living Arrangements/Services Living arrangements for the past 2 months: Single Family Home Lives with:: Self Patient language and need for interpreter reviewed:: Yes Do you feel safe going back to the place where you live?: Yes            Criminal Activity/Legal Involvement Pertinent to Current Situation/Hospitalization: No - Comment as needed  Activities of Daily Living Home Assistive Devices/Equipment: None ADL Screening (condition at time of admission) Patient's cognitive ability adequate to safely complete daily activities?: Yes Is the patient deaf or have difficulty hearing?: No Does the patient have difficulty seeing, even when wearing glasses/contacts?: No Does the patient have difficulty concentrating, remembering, or making decisions?: No Patient able to express need for assistance with ADLs?: Yes Does the patient have difficulty dressing or bathing?: Yes Independently performs ADLs?: No Communication: Independent Dressing (OT): Needs assistance Is this a change from baseline?: Pre-admission baseline Grooming: Needs assistance Is this  a change from baseline?: Pre-admission baseline Feeding: Needs assistance Is this a change from baseline?: Pre-admission baseline Bathing: Needs assistance Is this a change from baseline?: Pre-admission baseline Toileting: Needs assistance Is this a change from baseline?: Pre-admission baseline In/Out Bed: Needs assistance Is this a change from baseline?: Pre-admission baseline Walks in Home: Needs assistance Is this a change from  baseline?: Pre-admission baseline Does the patient have difficulty walking or climbing stairs?: Yes Weakness of Legs: Both Weakness of Arms/Hands: Both  Permission Sought/Granted                  Emotional Assessment Appearance:: Appears stated age Attitude/Demeanor/Rapport: Engaged Affect (typically observed): Calm Orientation: : Oriented to Self, Oriented to Place, Oriented to  Time, Oriented to Situation Alcohol / Substance Use: Not Applicable Psych Involvement: No (comment)  Admission diagnosis:  Laryngeal edema [J38.4] Patient Active Problem List   Diagnosis Date Noted   Laryngeal edema 03/11/2021   Nausea and vomiting 01/24/2021   Shortness of breath 01/10/2021   Mucositis due to antineoplastic therapy 01/10/2021   G tube feedings ([place]) 01/10/2021   Chemotherapy-induced diarrhea 10/30/2020   Port-A-Cath in place 10/23/2020   Cancer of tonsillar fossa ([place]) 08/31/2020   Laryngeal cancer ([place]) 01/06/2020   Metastatic cancer to cervical lymph nodes ([place]) 12/01/2019   Chronic hepatitis C without hepatic coma ([place]) 06/09/2019   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[place]) 05/02/2019   Squamous cell carcinoma of neck 04/01/2019   PCP:  [name], MD Pharmacy:   [id number] - [place], [place] [address] Phone: [phone] Fax: [phone]  [place] [address] [place] [address] Phone: [phone] Fax: [phone]     Social Determinants of Health (SDOH) Interventions    Readmission Risk Interventions No flowsheet data found.

## 2021-03-13 ENCOUNTER — Ambulatory Visit: Payer: Medicare HMO

## 2021-03-13 LAB — MAGNESIUM: Magnesium: 1.9 mg/dL (ref 1.7–2.4)

## 2021-03-13 LAB — PHOSPHORUS: Phosphorus: 2.8 mg/dL (ref 2.5–4.6)

## 2021-03-13 LAB — GLUCOSE, CAPILLARY
Glucose-Capillary: 144 mg/dL — ABNORMAL HIGH (ref 70–99)
Glucose-Capillary: 194 mg/dL — ABNORMAL HIGH (ref 70–99)

## 2021-03-13 MED ORDER — OXYCODONE HCL 5 MG PO TABS
5.0000 mg | ORAL_TABLET | ORAL | Status: DC | PRN
  Administered 2021-03-13 – 2021-03-14 (×3): 5 mg via ORAL
  Filled 2021-03-13 (×4): qty 1

## 2021-03-13 MED ORDER — VITAL HIGH PROTEIN PO LIQD: 1000.0000 mL | ORAL | Status: DC

## 2021-03-13 MED ORDER — OSMOLITE 1.5 CAL PO LIQD
237.0000 mL | Freq: Every day | ORAL | Status: DC
  Administered 2021-03-13 – 2021-03-14 (×5): 237 mL
  Filled 2021-03-13 (×9): qty 237

## 2021-03-13 MED ORDER — PROSOURCE TF PO LIQD
45.0000 mL | Freq: Two times a day (BID) | ORAL | Status: DC
  Administered 2021-03-13 – 2021-03-14 (×2): 45 mL
  Filled 2021-03-13 (×3): qty 45

## 2021-03-13 MED ORDER — MORPHINE SULFATE (PF) 2 MG/ML IV SOLN
2.0000 mg | INTRAVENOUS | Status: DC | PRN
  Administered 2021-03-13 (×2): 2 mg via INTRAVENOUS
  Filled 2021-03-13 (×2): qty 1

## 2021-03-13 MED ORDER — PROSOURCE TF PO LIQD: 45.0000 mL | Freq: Two times a day (BID) | ORAL | Status: DC

## 2021-03-13 MED ORDER — FREE WATER
100.0000 mL | Freq: Every day | Status: DC
  Administered 2021-03-13 – 2021-03-14 (×7): 100 mL

## 2021-03-13 NOTE — Progress Notes (Signed)
Initial Nutrition Assessment  DOCUMENTATION CODES:   Severe malnutrition in context of chronic illness, Underweight  INTERVENTION:  - will adjust TF regimen: 1 carton (237 ml) Osmolite 1.5 x6/day with 45 ml Prosource TF BIDand 100 ml free water/TF bolus. - this regimen will provide 2210 kcal, 111 grams protein, and 1686 ml free water.    NUTRITION DIAGNOSIS:   Severe Malnutrition related to chronic illness, cancer and cancer related treatments as evidenced by severe fat depletion, severe muscle depletion.  GOAL:   Patient will meet greater than or equal to 90% of their needs  MONITOR:   TF tolerance, Labs, Weight trends  REASON FOR ASSESSMENT:   Consult Enteral/tube feeding initiation and management  ASSESSMENT:   70 year old male with medical history of squamous cell carcinoma of R supraglottis s/p XRT in 2020, concurrent chemoradiation in 10/2020, currently G-tube dependent, and hx of alcohol/substance abuse. He presented to the ED due to worsening cough (productive) and dyspnea. CT soft tissue of neck showed diffuse pharyngeal and laryngeal mucosa/submucosal edema with airway narrowing. He underwent laryngoscopy on 6/28. Current plan is to continue conservative management with steroids  Patient has been NPO since admission. Able to communicate with [place] RD who has been following patient. She reports that patient has consumed some PO items in the past despite being aware of need to remain NPO. Patient with chronic thick, copious secretions and needs to suction or spit frequently.  RD reports that patient's goal TF regimen is for 6 cartons (237 ml each) Osmolite 1.5/day. This provides 2130 kcal, 89 grams protein, and 1086 ml free water.   Order currently in place for 1 carton (237 ml) Osmolite 1.5 QID. This regimen provides 1420 kcal, 60 grams protein, and 724 ml free water.   Able to talk with RN late morning, shortly after she had provided patient with a bolus of 1  carton (237 ml) of Osmolite 1.5. she reports no issues with PEG or providing TF bolus.   Patient laying in bed with no family or visitors present when RD entered room immediately after discussion with RN. Patient was laying with HOB <30 degrees. He was sleeping and did not awake to name call x3 but did awake to shoulder rub x1. He took some time to clear throat/secretions enough to talk with RD.   He reports that he has not been consuming anything in the past 1 week other than infrequently having a tsp of water to help him swallow as he began to experience sensation of esophageal narrowing and difficulty breathing 2/2 this. He states all items are per tube.  At home he would do 1-2 cartons (237 ml each) Osmolite 1.5 TID or QID. He often felt nauseated after these boluses. He has anti-emetic medication at home but it sometimes helped and sometimes did not.   He reports feeling nauseated after bolus given this AM. Set HOB to <45 degrees and patient reports feeling better, feels he laid down too much too soon after TF bolus. He does report ongoing pain at PEG site which began on 6/26.  Weight on 6/27 was 114 lb and weight on 4/28 was 122 lb. This indicates 8 lb weight loss (6.5% body weight) in the past 2 months.  Per notes: - diffuse pharyngeal/laryngeal edema--s/p laryngoscopy 6/28, on steroids, no further recommendations from ENT - pain at PEG site--IR evaluated and reported functioning well but recommend CT abdomen is pain continues - MD note this AM states home 6/30 likely   Labs  reviewed; Na: 132 mmol/l, Cl: 93 mmol/l, creatinine: 0.49 mg/dl. Medications reviewed.    NUTRITION - FOCUSED PHYSICAL EXAM:  Flowsheet Row Most Recent Value  Orbital Region Severe depletion  Upper Arm Region Severe depletion  Thoracic and Lumbar Region Unable to assess  Buccal Region Severe depletion  Temple Region Moderate depletion  Clavicle Bone Region Severe depletion  Clavicle and Acromion Bone Region  Severe depletion  Scapular Bone Region Moderate depletion  Dorsal Hand Moderate depletion  Patellar Region Severe depletion  Anterior Thigh Region Unable to assess  Posterior Calf Region Severe depletion  Edema (RD Assessment) None  Hair Reviewed  Eyes Reviewed  [cloudy]  Mouth Unable to assess  [d/t frequent need for suctioning and persistent coughing]  Skin Reviewed  Nails Reviewed       Diet Order:   Diet Order             Diet NPO time specified  Diet effective now                   EDUCATION NEEDS:   No education needs have been identified at this time  Skin:  Skin Assessment: Reviewed RN Assessment  Last BM:  PTA/unknown  Height:   Ht Readings from Last 1 Encounters:  03/11/21 5\' 8"  (1.727 m)    Weight:   Wt Readings from Last 1 Encounters:  03/11/21 51.7 kg     Estimated Nutritional Needs:  Kcal:  7846-9629 kcal Protein:  105-120 grams Fluid:  >/= 2.1 L/day     Jarome Matin, MS, RD, LDN, CNSC Inpatient Clinical Dietitian RD pager # available in [facility]  After hours/weekend pager # available in [facility]

## 2021-03-13 NOTE — Progress Notes (Signed)
Chaplain engaged in an initial visit with [name].  Chaplain learned of what brought [name] into the hospital and his trouble with breathing.  Ladarrious noted that he was feeling some pain due to his feeding tube and that he had already told his nurse.  Chaplain offered listening and support as she checked in with him.    Chaplain will follow-up.    03/13/21 1200  Clinical Encounter Type  Visited With Patient  Visit Type Initial

## 2021-03-13 NOTE — Progress Notes (Signed)
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  Also reported productive cough.  On presentation he was hemodynamically stable.  CT soft tissue neck with contrast showed diffuse pharyngeal and laryngeal mucosa/submucosal edema with airway narrowing.  ED physician discussed with [facility], Dr. Redmond Baseman who recommended IV steroids .  He underwent laryngoscopy on 6/28.  Current plan is to continue conservative management with steroids  Assessment & Plan:   Principal Problem:   Laryngeal edema Active Problems:   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HCC)   G tube feedings (HCC)   Diffuse pharyngeal/laryngeal edema: .  ENT following. He underwent laryngoscopy on 6/28.  Current plan is to continue conservative management with steroids  No wheezing auscultated today.  His cough is better.   Plan to change the steroids to oral on DC  if no further recommendation from ENT.  Currently he is on room air.  Patient had previous similar episode after chemotherapy.  Most likely pharyngeal/laryngeal edema was associated with chemo.  Laryngeal cancer: History of SCC of right supraglottis/laryngeal cancer s/p RT 2020, concurrent CRT 10/2020, currently on Docetaxel s/p cycle 1.  We recommend to follow-up with ENT/oncology as an outpatient.  Currently G-tube dependent, will continue tube feeding.  Pain at the PEG site: IR evaluated the peg tube, PEG tube is functioning well.  Will consider CT abdomen without contrast if he continues to have pain  Hypertension: Currently blood  stable.  On losartan at home  Leukopenia: Most likely secondary to chemotherapy.  Continue to monitor  Chronic alcohol use: Reports drinking 40 ounce of beer daily.  Currently not on withdrawal.         DVT prophylaxis: Lovenox Code Status:Full  Family Communication: Patient said no need to call anybody Status is: Inpatient  Remains inpatient appropriate because:IV treatments appropriate due to intensity of illness or inability to take PO  Dispo: The patient is from: Home              Anticipated d/c is to: Home tomorrow with oral steroids              Patient currently is not medically stable to d/c.   Difficult to place patient No     Consultants: ENT  Procedures:None  Antimicrobials:  Anti-infectives (From admission, onward)    None       Subjective:  Patient seen and examined the bedside this morning.  Hemodynamically stable.  Complains of pain at the PEG site.  On examination the PEG site was dry without any erythema or edema.  His respiratory status is stable.  He is not wheezing.  Still having some cough   Objective: Vitals:   03/13/21 0700 03/13/21 0800 03/13/21 0808 03/13/21 0900  BP: 123/85 (!) 147/96  (!) 162/103  Pulse: 77 86  76  Resp: 14 16  15   Temp:   98.3 F (36.8 C)   TempSrc:   Oral   SpO2: 97% 100%  99%  Weight:      Height:        Intake/Output Summary (Last 24 hours) at 03/13/2021 1051 Last data filed  at 03/13/2021 1008 Gross per 24 hour  Intake 546 ml  Output 875 ml  Net -329 ml   Filed Weights   03/11/21 1638  Weight: 51.7 kg    Examination:  General exam: Overall comfortable, not in distress HEENT: PERRL Respiratory system:  no wheezes or crackles  Cardiovascular system: S1 & S2 heard, RRR.  Gastrointestinal system: Abdomen is nondistended, soft and nontender.PEG tube Central nervous system: Alert and oriented Extremities: No edema, no clubbing ,no cyanosis Skin: No rashes, no ulcers,no icterus     Data Reviewed:  I have personally reviewed following labs and imaging studies  CBC: Recent Labs  Lab 03/11/21 1656 03/12/21 0528  WBC 2.8* 4.0  NEUTROABS 1.4*  --   HGB 11.0* 11.5*  HCT 32.8* 34.3*  MCV 92.7 93.0  PLT 150 500   Basic Metabolic Panel: Recent Labs  Lab 03/11/21 1656 03/12/21 0528  NA 131* 132*  K 3.8 4.3  CL 92* 93*  CO2 31 29  GLUCOSE 108* 138*  BUN 11 12  CREATININE 0.53* 0.49*  CALCIUM 9.3 9.3   GFR: Estimated Creatinine Clearance: 65.5 mL/min (A) (by C-G formula based on SCr of 0.49 mg/dL (L)). Liver Function Tests: Recent Labs  Lab 03/11/21 1656  AST 14*  ALT 11  ALKPHOS 50  BILITOT 0.8  PROT 7.6  ALBUMIN 3.8   No results for input(s): LIPASE, AMYLASE in the last 168 hours. No results for input(s): AMMONIA in the last 168 hours. Coagulation Profile: No results for input(s): INR, PROTIME in the last 168 hours. Cardiac Enzymes: No results for input(s): CKTOTAL, CKMB, CKMBINDEX, TROPONINI in the last 168 hours. BNP (last 3 results) No results for input(s): PROBNP in the last 8760 hours. HbA1C: No results for input(s): HGBA1C in the last 72 hours. CBG: No results for input(s): GLUCAP in the last 168 hours. Lipid Profile: No results for input(s): CHOL, HDL, LDLCALC, TRIG, CHOLHDL, LDLDIRECT in the last 72 hours. Thyroid Function Tests: No results for input(s): TSH, T4TOTAL, FREET4, T3FREE, THYROIDAB in the last 72 hours. Anemia Panel: No results for input(s): VITAMINB12, FOLATE, FERRITIN, TIBC, IRON, RETICCTPCT in the last 72 hours. Sepsis Labs: No results for input(s): PROCALCITON, LATICACIDVEN in the last 168 hours.  Recent Results (from the past 240 hour(s))  Resp Panel by RT-PCR (Flu A&B, Covid) Nasopharyngeal Swab     Status: None   Collection Time: 03/11/21  6:45 PM   Specimen: Nasopharyngeal Swab; Nasopharyngeal(NP) swabs in vial transport medium  Result Value Ref Range Status   SARS Coronavirus 2 by RT PCR NEGATIVE NEGATIVE Final    Comment:  (NOTE) SARS-CoV-2 target nucleic acids are NOT DETECTED.  The SARS-CoV-2 RNA is generally detectable in upper respiratory specimens during the acute phase of infection. The lowest concentration of SARS-CoV-2 viral copies this assay can detect is 138 copies/mL. A negative result does not preclude SARS-Cov-2 infection and should not be used as the sole basis for treatment or other patient management decisions. A negative result may occur with  improper specimen collection/handling, submission of specimen other than nasopharyngeal swab, presence of viral mutation(s) within the areas targeted by this assay, and inadequate number of viral copies(<138 copies/mL). A negative result must be combined with clinical observations, patient history, and epidemiological information. The expected result is Negative.  Fact Sheet for Patients:  EntrepreneurPulse.com.au  Fact Sheet for Healthcare Providers:  IncredibleEmployment.be  This test is no t yet approved or cleared by the [place] and  has been authorized  for detection and/or diagnosis of SARS-CoV-2 by FDA under an Emergency Use Authorization (EUA). This EUA will remain  in effect (meaning this test can be used) for the duration of the COVID-19 declaration under Section 564(b)(1) of the Act, 21 U.S.C.section 360bbb-3(b)(1), unless the authorization is terminated  or revoked sooner.       Influenza A by PCR NEGATIVE NEGATIVE Final   Influenza B by PCR NEGATIVE NEGATIVE Final    Comment: (NOTE) The Xpert Xpress SARS-CoV-2/FLU/RSV plus assay is intended as an aid in the diagnosis of influenza from Nasopharyngeal swab specimens and should not be used as a sole basis for treatment. Nasal washings and aspirates are unacceptable for Xpert Xpress SARS-CoV-2/FLU/RSV testing.  Fact Sheet for Patients: EntrepreneurPulse.com.au  Fact Sheet for Healthcare  Providers: IncredibleEmployment.be  This test is not yet approved or cleared by the [place] FDA and has been authorized for detection and/or diagnosis of SARS-CoV-2 by FDA under an Emergency Use Authorization (EUA). This EUA will remain in effect (meaning this test can be used) for the duration of the COVID-19 declaration under Section 564(b)(1) of the Act, 21 U.S.C. section 360bbb-3(b)(1), unless the authorization is terminated or revoked.  Performed at [facility], [place] [address], [place], [place] [address]   MRSA PCR Screening     Status: None   Collection Time: 03/12/21  6:47 AM  Result Value Ref Range Status   MRSA by PCR NEGATIVE NEGATIVE Final    Comment:        The GeneXpert MRSA Assay (FDA approved for NASAL specimens only), is one component of a comprehensive MRSA colonization surveillance program. It is not intended to diagnose MRSA infection nor to guide or monitor treatment for MRSA infections. Performed at [facility], [place] [address] [place],  [address]          Radiology Studies: CT Soft Tissue Neck W Contrast  Result Date: 03/11/2021 CLINICAL DATA:  Worsening shortness of breath since yesterday. Stridor. Tumor in the throat being treated with chemotherapy. EXAM: CT NECK WITH CONTRAST TECHNIQUE: Multidetector CT imaging of the neck was performed using the standard protocol following the bolus administration of intravenous contrast. CONTRAST:  29mL OMNIPAQUE IOHEXOL 300 MG/ML  SOLN COMPARISON:  PET scan 01/31/2021.  CT 07/05/2020. FINDINGS: Pharynx and larynx: Increased edema of the mucosa an the parapharyngeal space throughout the mid upper neck consistent with acute radiation change. There does not appear to be focal tumor responsible for this. This results in airway narrowing and could possibly result in critical narrowing. Salivary glands: Parotid and submandibular glands do not show acute  disease. Some volume loss probably due to previous radiation. Thyroid: Normal Lymph nodes: Redemonstration of recurrent malignant lymphadenopathy in the left neck, level 2 and 3. This extends over a length of about 3-4 cm with maximal transverse diameter of 21 mm. Similar appearance to the recent PET scan. Additional lymphadenopathy in the low right supraclavicular region just above and behind the clavicular head, measuring about 18 mm in diameter. No new area of recurrent nodal disease is seen compared to the previous PET scan. Vascular: No acute vascular finding. Limited intracranial: Negative Visualized orbits: Normal Mastoids and visualized paranasal sinuses: Clear Skeleton: Chronic cervical spondylosis. Upper chest: Negative Other: None IMPRESSION: Diffuse pharyngeal and laryngeal mucosal and submucosal edema probably related to acute radiation injury. The airway is narrow and could possibly be critically narrowed. I do not see any evidence of recurrent mucosal or submucosal tumor identifiable. Similar appearance  of the recurrent malignant lymphadenopathy on the left at level 2 and level 3 and on the right in the low supraclavicular region just above and behind the clavicular head. Electronically Signed   By: Nelson Chimes M.D.   On: 03/11/2021 17:53        Scheduled Meds:  atropine  1 drop Right Eye BID   Chlorhexidine Gluconate Cloth  6 each Topical Daily   dexamethasone (DECADRON) injection  4 mg Intravenous Q6H   enoxaparin (LOVENOX) injection  40 mg Subcutaneous Q24H   feeding supplement (OSMOLITE 1.5 CAL)  237 mL Per Tube QID   losartan  50 mg Per Tube Daily   mouth rinse  15 mL Mouth Rinse BID   prednisoLONE acetate  1 drop Right Eye QID   sodium chloride flush  3 mL Intravenous Q12H   Continuous Infusions:   LOS: 2 days    Time spent:35 mins. More than 50% of that time was spent in counseling and/or coordination of care.      Shelly Coss, MD [facility] P6/29/2022,  10:51 AM

## 2021-03-13 NOTE — Progress Notes (Signed)
Patient ID: [name], male   DOB: 06/07/1951, 70 y.o.   MRN: [id number] Asked by hospitalist service to evaluate patient's G-tube secondary to report of drainage and pain at insertion site . Tube was initially placed on 09/24/2020.  On exam today tube appears intact, insertion site okay, no visible drainage at this time or signs of infection.  Insertion site is close to the costal margin which may be causing some irritation.  Outer disc cinched taut to skin surface to prevent potential leakage. Per nursing infusions through tube are proceeding without difficulty.  At this time rec to continue to closely monitor the tube and make sure outer disc remains taut to skin surface.  Should pain increase recommend limited CT abdomen without contrast to assess site.

## 2021-03-14 LAB — GLUCOSE, CAPILLARY
Glucose-Capillary: 138 mg/dL — ABNORMAL HIGH (ref 70–99)
Glucose-Capillary: 138 mg/dL — ABNORMAL HIGH (ref 70–99)
Glucose-Capillary: 186 mg/dL — ABNORMAL HIGH (ref 70–99)

## 2021-03-14 LAB — MAGNESIUM: Magnesium: 1.9 mg/dL (ref 1.7–2.4)

## 2021-03-14 LAB — PHOSPHORUS: Phosphorus: 3.2 mg/dL (ref 2.5–4.6)

## 2021-03-14 MED ORDER — OXYCODONE HCL 5 MG PO TABS: 5.0000 mg | ORAL_TABLET | ORAL | 0 refills | Status: DC | PRN

## 2021-03-14 MED ORDER — HEPARIN SOD (PORK) LOCK FLUSH 100 UNIT/ML IV SOLN
500.0000 [IU] | INTRAVENOUS | Status: AC | PRN
Start: 2021-03-14 — End: 2021-03-14
  Administered 2021-03-14: 500 [IU]

## 2021-03-14 MED ORDER — DEXAMETHASONE 1 MG PO TABS: 1.0000 mg | ORAL_TABLET | Freq: Four times a day (QID) | ORAL | 0 refills | Status: DC

## 2021-03-14 MED ORDER — OXYCODONE HCL 5 MG PO TABS
5.0000 mg | ORAL_TABLET | ORAL | Status: AC | PRN
Start: 2021-03-14 — End: 2021-03-14
  Administered 2021-03-14: 5 mg
  Filled 2021-03-14 (×2): qty 1

## 2021-03-14 NOTE — Progress Notes (Signed)
Peg dressing changed, now clean dry intact.

## 2021-03-14 NOTE — Discharge Summary (Signed)
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  Also reported productive cough.  On presentation he was hemodynamically stable.  CT soft tissue neck with contrast showed diffuse pharyngeal and laryngeal mucosa/submucosal edema with airway narrowing.  ED physician discussed with [facility], Dr. Redmond Baseman who recommended IV steroids .  He underwent laryngoscopy on 6/28.  He is symptomatically much better.  He is hemodynamically stable for discharge to home with oral steroids.  He will follow-up with ENT and his oncologist in outpatient.  Following problems were addressed during his hospitalization:   Diffuse pharyngeal/laryngeal edema: .  ENT were following. He underwent laryngoscopy on 6/28.  Current plan is to continue conservative management with steroids  No wheezing auscultated today.  His cough is better.   Plan to change the steroids to oral on DC .  Currently he is on room air.  Patient had previous similar episode after chemotherapy.  Most likely pharyngeal/laryngeal edema was associated with chemo.  Continue dexamethasone on tapering doses on discharge.   Laryngeal cancer: History of SCC of right supraglottis/laryngeal cancer s/p RT 2020, concurrent CRT 10/2020, currently on Docetaxel s/p cycle 1.  We recommend to follow-up with ENT/oncology as an outpatient.  Currently G-tube dependent, will continue tube feeding.    Pain at the PEG site: IR evaluated the peg tube, PEG tube is functioning well.  Pain is better   Hypertension: Currently blood stable.  On losartan at home   Leukopenia: Most likely secondary to chemotherapy.  Continue to monitor   Chronic alcohol use: Reports drinking 40 ounce of beer daily.  Currently not on withdrawal.  Counseled for cessation    Discharge Diagnoses:  Principal Problem:   Laryngeal edema Active Problems:   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HCC)   G tube feedings [facility])    Discharge Instructions  Discharge Instructions     Diet - low sodium heart healthy   Complete by: As directed    Discharge instructions   Complete by: As directed    1)Please take prescribed medications as instructed. 2)Follow up with Dr. Constance Holster, ENT, in a week 3)Follow up with your oncologist and primary care physician   Increase activity slowly   Complete by: As directed       Allergies as of 03/14/2021   No Known Allergies      Medication List     STOP taking these medications    docusate sodium 50 MG capsule Commonly known as: COLACE   fluconazole 200 MG tablet Commonly known as: DIFLUCAN   HYDROcodone-acetaminophen 7.5-325 mg/15 ml solution Commonly known as: HYCET       TAKE these medications    atropine 1 % ophthalmic solution Place 1 drop into the right eye 2 (two) times a day.   dexamethasone 4 MG tablet Commonly known as: DECADRON Take 2 tablets (8 mg total) by mouth 2 (two) times daily. Start the day before Taxotere. Then daily after chemo for 2 days. What changed: Another medication with the same name was  added. Make sure you understand how and when to take each.   dexamethasone 1 MG tablet Commonly known as: DECADRON Take 1 tablet (1 mg total) by mouth every 6 (six) hours. Take 4 pills 2 times a day for 1 day then 4 pills daily for 1 day then 2 pills daily for 1 day then 1 pill daily for 1 day then stop Start taking on: March 15, 2021 What changed: You were already taking a medication with the same name, and this prescription was added. Make sure you understand how and when to take each.   feeding supplement (OSMOLITE 1.5 CAL) Liqd Give 1 carton Osmolite 1.5 via PEG 4 times daily with 90 mL free water before and after bolus feeding.  Increase to 1-1/2 cartons 4 times daily.  In addition, drink or flush tube with an additional 240 mL free water twice daily.  This provides 2130 cal, 89.4 g protein and 2352 mL free water which is 100% estimated needs.   losartan 50 MG tablet Commonly known as: COZAAR Take 50 mg by mouth daily.   ondansetron 8 MG tablet Commonly known as: Zofran Take 1 tablet (8 mg total) by mouth 2 (two) times daily as needed for refractory nausea / vomiting.   oxyCODONE 5 MG immediate release tablet Commonly known as: Oxy IR/ROXICODONE Take 1 tablet (5 mg total) by mouth every 4 (four) hours as needed for moderate pain.   prednisoLONE acetate 1 % ophthalmic suspension Commonly known as: PRED FORTE Place 1 drop into the right eye 4 (four) times daily.   prochlorperazine 10 MG tablet Commonly known as: COMPAZINE Take 1 tablet (10 mg total) by mouth every 6 (six) hours as needed (Nausea or vomiting).   tolterodine 4 MG 24 hr capsule Commonly known as: DETROL LA Take 4 mg by mouth daily.   Vitamin D (Ergocalciferol) 1.25 MG (50000 UNIT) Caps capsule Commonly known as: DRISDOL Take 50,000 Units by mouth once a week.       ASK your doctor about these medications    lidocaine-prilocaine cream Commonly known as: EMLA Apply to affected area once        Follow-up Information     Izora Gala, MD. Schedule an appointment as soon as possible for a visit in 1 week(s).   Specialty: Otolaryngology Contact information: [address] [place] [address] [phone]         [name], MD. Schedule an appointment as soon as possible for a visit in 1 week(s).    Specialty: Internal Medicine Contact information: [place] [phone]                No Known Allergies  Consultations: EnT   Procedures/Studies: CT Soft Tissue Neck W Contrast  Result Date: 03/11/2021 CLINICAL DATA:  Worsening shortness of breath since yesterday. Stridor. Tumor in the throat being treated with chemotherapy. EXAM: CT NECK WITH CONTRAST TECHNIQUE: Multidetector CT imaging of the neck was performed using the standard protocol following the bolus administration of intravenous contrast. CONTRAST:  75mL OMNIPAQUE IOHEXOL 300 MG/ML  SOLN COMPARISON:  PET scan 01/31/2021.  CT 07/05/2020. FINDINGS: Pharynx and larynx: Increased edema of the mucosa an the parapharyngeal space throughout the mid upper neck consistent with acute radiation change. There does not appear to be focal tumor responsible for this. This results in airway narrowing and could possibly result in critical narrowing. Salivary glands: Parotid and submandibular glands do not show acute disease. Some  volume loss probably due to previous radiation. Thyroid: Normal Lymph nodes: Redemonstration of recurrent malignant lymphadenopathy in the left neck, level 2 and 3. This extends over a length of about 3-4 cm with maximal transverse diameter of 21 mm. Similar appearance to the recent PET scan. Additional lymphadenopathy in the low right supraclavicular region just above and behind the clavicular head, measuring about 18 mm in diameter. No new area of recurrent nodal disease is seen compared to the previous PET scan. Vascular: No acute vascular finding. Limited intracranial: Negative Visualized orbits: Normal Mastoids and visualized paranasal sinuses: Clear Skeleton: Chronic cervical spondylosis. Upper chest: Negative Other: None IMPRESSION: Diffuse pharyngeal and laryngeal mucosal and submucosal edema probably related to acute radiation injury. The airway is narrow and could possibly be  critically narrowed. I do not see any evidence of recurrent mucosal or submucosal tumor identifiable. Similar appearance of the recurrent malignant lymphadenopathy on the left at level 2 and level 3 and on the right in the low supraclavicular region just above and behind the clavicular head. Electronically Signed   By: Nelson Chimes M.D.   On: 03/11/2021 17:53      Subjective:  Patient seen and examined at the bedside this morning.  Hemodynamically stable for discharge today.   Discharge Exam: Vitals:   03/14/21 0905 03/14/21 0955  BP: (!) 141/100 (!) 139/102  Pulse: 67 79  Resp: 18 19  Temp: 98.3 F (36.8 C) 97.9 F (36.6 C)  SpO2: 100% 100%   Vitals:   03/14/21 0500 03/14/21 0606 03/14/21 0905 03/14/21 0955  BP:  (!) 145/97 (!) 141/100 (!) 139/102  Pulse:  70 67 79  Resp:  18 18 19   Temp:  98.3 F (36.8 C) 98.3 F (36.8 C) 97.9 F (36.6 C)  TempSrc:  Oral Oral Oral  SpO2:  100% 100% 100%  Weight: 49.8 kg     Height:        General: Pt is alert, awake, not in acute distress Cardiovascular: RRR, S1/S2 +, no rubs, no gallops Respiratory: CTA bilaterally, no wheezing, no rhonchi Abdominal: Soft, NT, ND, bowel sounds + Extremities: no edema, no cyanosis    The results of significant diagnostics from this hospitalization (including imaging, microbiology, ancillary and laboratory) are listed below for reference.     Microbiology: Recent Results (from the past 240 hour(s))  Resp Panel by RT-PCR (Flu A&B, Covid) Nasopharyngeal Swab     Status: None   Collection Time: 03/11/21  6:45 PM   Specimen: Nasopharyngeal Swab; Nasopharyngeal(NP) swabs in vial transport medium  Result Value Ref Range Status   SARS Coronavirus 2 by RT PCR NEGATIVE NEGATIVE Final    Comment: (NOTE) SARS-CoV-2 target nucleic acids are NOT DETECTED.  The SARS-CoV-2 RNA is generally detectable in upper respiratory specimens during the acute phase of infection. The lowest concentration of SARS-CoV-2  viral copies this assay can detect is 138 copies/mL. A negative result does not preclude SARS-Cov-2 infection and should not be used as the sole basis for treatment or other patient management decisions. A negative result may occur with  improper specimen collection/handling, submission of specimen other than nasopharyngeal swab, presence of viral mutation(s) within the areas targeted by this assay, and inadequate number of viral copies(<138 copies/mL). A negative result must be combined with clinical observations, patient history, and epidemiological information. The expected result is Negative.  Fact Sheet for Patients:  EntrepreneurPulse.com.au  Fact Sheet for Healthcare Providers:  IncredibleEmployment.be  This test is no t yet approved or  cleared by the [place] and  has been authorized for detection and/or diagnosis of SARS-CoV-2 by FDA under an Emergency Use Authorization (EUA). This EUA will remain  in effect (meaning this test can be used) for the duration of the COVID-19 declaration under Section 564(b)(1) of the Act, 21 U.S.C.section 360bbb-3(b)(1), unless the authorization is terminated  or revoked sooner.       Influenza A by PCR NEGATIVE NEGATIVE Final   Influenza B by PCR NEGATIVE NEGATIVE Final    Comment: (NOTE) The Xpert Xpress SARS-CoV-2/FLU/RSV plus assay is intended as an aid in the diagnosis of influenza from Nasopharyngeal swab specimens and should not be used as a sole basis for treatment. Nasal washings and aspirates are unacceptable for Xpert Xpress SARS-CoV-2/FLU/RSV testing.  Fact Sheet for Patients: EntrepreneurPulse.com.au  Fact Sheet for Healthcare Providers: IncredibleEmployment.be  This test is not yet approved or cleared by the [place] FDA and has been authorized for detection and/or diagnosis of SARS-CoV-2 by FDA under an Emergency Use Authorization  (EUA). This EUA will remain in effect (meaning this test can be used) for the duration of the COVID-19 declaration under Section 564(b)(1) of the Act, 21 U.S.C. section 360bbb-3(b)(1), unless the authorization is terminated or revoked.  Performed at [facility], [place] [address], [place], [place] [address]   MRSA PCR Screening     Status: None   Collection Time: 03/12/21  6:47 AM  Result Value Ref Range Status   MRSA by PCR NEGATIVE NEGATIVE Final    Comment:        The GeneXpert MRSA Assay (FDA approved for NASAL specimens only), is one component of a comprehensive MRSA colonization surveillance program. It is not intended to diagnose MRSA infection nor to guide or monitor treatment for MRSA infections. Performed at [facility], [place] [address] [place], [place] [address]      Labs: BNP (last 3 results) No results for input(s): BNP in the last 8760 hours. Basic Metabolic Panel: Recent Labs  Lab 03/11/21 1656 03/12/21 0528 03/13/21 1700  NA 131* 132*  --   K 3.8 4.3  --   CL 92* 93*  --   CO2 31 29  --   GLUCOSE 108* 138*  --   BUN 11 12  --   CREATININE 0.53* 0.49*  --   CALCIUM 9.3 9.3  --   MG  --   --  1.9  PHOS  --   --  2.8   Liver Function Tests: Recent Labs  Lab 03/11/21 1656  AST 14*  ALT 11  ALKPHOS 50  BILITOT 0.8  PROT 7.6  ALBUMIN 3.8   No results for input(s): LIPASE, AMYLASE in the last 168 hours. No results for input(s): AMMONIA in the last 168 hours. CBC: Recent Labs  Lab 03/11/21 1656 03/12/21 0528  WBC 2.8* 4.0  NEUTROABS 1.4*  --   HGB 11.0* 11.5*  HCT 32.8* 34.3*  MCV 92.7 93.0  PLT 150 176   Cardiac Enzymes: No results for input(s): CKTOTAL, CKMB, CKMBINDEX, TROPONINI in the last 168 hours. BNP: Invalid input(s): POCBNP CBG: Recent Labs  Lab 03/13/21 2014 03/13/21 2351 03/14/21 0356 03/14/21 0724  GLUCAP 144* 194* 138* 138*   D-Dimer No results for input(s): DDIMER in the  last 72 hours. Hgb A1c No results for input(s): HGBA1C in the last 72 hours. Lipid Profile No results for input(s): CHOL, HDL, LDLCALC, TRIG, CHOLHDL, LDLDIRECT in the last 72 hours. Thyroid  function studies No results for input(s): TSH, T4TOTAL, T3FREE, THYROIDAB in the last 72 hours.  Invalid input(s): FREET3 Anemia work up No results for input(s): VITAMINB12, FOLATE, FERRITIN, TIBC, IRON, RETICCTPCT in the last 72 hours. Urinalysis    Component Value Date/Time   COLORURINE YELLOW 11/10/2019 0940   APPEARANCEUR CLEAR 11/10/2019 0940   LABSPEC 1.016 11/10/2019 0940   PHURINE 7.0 11/10/2019 0940   GLUCOSEU NEGATIVE 11/10/2019 0940   HGBUR NEGATIVE 11/10/2019 0940   BILIRUBINUR NEGATIVE 11/10/2019 0940   KETONESUR NEGATIVE 11/10/2019 0940   PROTEINUR NEGATIVE 11/10/2019 0940   UROBILINOGEN 4.0 (H) 10/13/2007 1940   NITRITE NEGATIVE 11/10/2019 0940   LEUKOCYTESUR NEGATIVE 11/10/2019 0940   Sepsis Labs Invalid input(s): PROCALCITONIN,  WBC,  LACTICIDVEN Microbiology Recent Results (from the past 240 hour(s))  Resp Panel by RT-PCR (Flu A&B, Covid) Nasopharyngeal Swab     Status: None   Collection Time: 03/11/21  6:45 PM   Specimen: Nasopharyngeal Swab; Nasopharyngeal(NP) swabs in vial transport medium  Result Value Ref Range Status   SARS Coronavirus 2 by RT PCR NEGATIVE NEGATIVE Final    Comment: (NOTE) SARS-CoV-2 target nucleic acids are NOT DETECTED.  The SARS-CoV-2 RNA is generally detectable in upper respiratory specimens during the acute phase of infection. The lowest concentration of SARS-CoV-2 viral copies this assay can detect is 138 copies/mL. A negative result does not preclude SARS-Cov-2 infection and should not be used as the sole basis for treatment or other patient management decisions. A negative result may occur with  improper specimen collection/handling, submission of specimen other than nasopharyngeal swab, presence of viral mutation(s) within the areas  targeted by this assay, and inadequate number of viral copies(<138 copies/mL). A negative result must be combined with clinical observations, patient history, and epidemiological information. The expected result is Negative.  Fact Sheet for Patients:  EntrepreneurPulse.com.au  Fact Sheet for Healthcare Providers:  IncredibleEmployment.be  This test is no t yet approved or cleared by the [place] FDA and  has been authorized for detection and/or diagnosis of SARS-CoV-2 by FDA under an Emergency Use Authorization (EUA). This EUA will remain  in effect (meaning this test can be used) for the duration of the COVID-19 declaration under Section 564(b)(1) of the Act, 21 U.S.C.section 360bbb-3(b)(1), unless the authorization is terminated  or revoked sooner.       Influenza A by PCR NEGATIVE NEGATIVE Final   Influenza B by PCR NEGATIVE NEGATIVE Final    Comment: (NOTE) The Xpert Xpress SARS-CoV-2/FLU/RSV plus assay is intended as an aid in the diagnosis of influenza from Nasopharyngeal swab specimens and should not be used as a sole basis for treatment. Nasal washings and aspirates are unacceptable for Xpert Xpress SARS-CoV-2/FLU/RSV testing.  Fact Sheet for Patients: EntrepreneurPulse.com.au  Fact Sheet for Healthcare Providers: IncredibleEmployment.be  This test is not yet approved or cleared by the [place] FDA and has been authorized for detection and/or diagnosis of SARS-CoV-2 by FDA under an Emergency Use Authorization (EUA). This EUA will remain in effect (meaning this test can be used) for the duration of the COVID-19 declaration under Section 564(b)(1) of the Act, 21 U.S.C. section 360bbb-3(b)(1), unless the authorization is terminated or revoked.  Performed at [facility], [place] [address] [place], [place] [address]   MRSA PCR Screening     Status: None   Collection  Time: 03/12/21  6:47 AM  Result Value Ref Range Status   MRSA by PCR NEGATIVE NEGATIVE Final    Comment:  The GeneXpert MRSA Assay (FDA approved for NASAL specimens only), is one component of a comprehensive MRSA colonization surveillance program. It is not intended to diagnose MRSA infection nor to guide or monitor treatment for MRSA infections. Performed at [facility], [place] [address] [place], [place] [address]     Please note: You were cared for by a hospitalist during your hospital stay. Once you are discharged, your primary care physician will handle any further medical issues. Please note that NO REFILLS for any discharge medications will be authorized once you are discharged, as it is imperative that you return to your primary care physician (or establish a relationship with a primary care physician if you do not have one) for your post hospital discharge needs so that they can reassess your need for medications and monitor your lab values.    Time coordinating discharge: 40 minutes  SIGNED:   Shelly Coss, MD  [facility] 03/14/2021, 10:26 AM Pager [phone]  If 7PM-7AM, please contact night-coverage [email or web address] Password [email or web address]

## 2021-03-14 NOTE — Care Management Important Message (Signed)
Important Message  Patient Details IM Letter placed in Patient's room Name: [name] MRN: [id number] Date of Birth: 01-10-1951   Medicare Important Message Given:  Yes     Kerin Salen 03/14/2021, 10:47 AM

## 2021-03-14 NOTE — Progress Notes (Signed)
No change from am assessment. Pt remains alert, oriented x4 ambulatory with minimal assistance. No change from baseline. Discharge instructions were reviewed. Questions, concerns denied at this time.

## 2021-03-15 ENCOUNTER — Other Ambulatory Visit (HOSPITAL_COMMUNITY): Payer: Self-pay | Admitting: Interventional Radiology

## 2021-03-15 ENCOUNTER — Telehealth: Payer: Self-pay | Admitting: Student

## 2021-03-15 DIAGNOSIS — R633 Feeding difficulties, unspecified: Secondary | ICD-10-CM

## 2021-03-19 ENCOUNTER — Ambulatory Visit (HOSPITAL_COMMUNITY): Payer: Medicare HMO

## 2021-03-19 ENCOUNTER — Telehealth (HOSPITAL_COMMUNITY): Payer: Self-pay

## 2021-03-19 NOTE — Telephone Encounter (Signed)
Called to schedule peg replacement, no answer, left vm. AW

## 2021-03-20 ENCOUNTER — Other Ambulatory Visit (HOSPITAL_COMMUNITY): Payer: Self-pay | Admitting: Interventional Radiology

## 2021-03-20 ENCOUNTER — Other Ambulatory Visit: Payer: Self-pay

## 2021-03-20 ENCOUNTER — Ambulatory Visit (HOSPITAL_COMMUNITY)
Admission: RE | Admit: 2021-03-20 | Discharge: 2021-03-20 | Disposition: A | Discharge status: Home / Self Care | Payer: Medicare HMO | Source: Ambulatory Visit | Attending: Interventional Radiology | Admitting: Interventional Radiology

## 2021-03-20 DIAGNOSIS — R633 Feeding difficulties, unspecified: Secondary | ICD-10-CM | POA: Insufficient documentation

## 2021-03-20 DIAGNOSIS — K9423 Gastrostomy malfunction: Secondary | ICD-10-CM | POA: Insufficient documentation

## 2021-03-20 HISTORY — PX: IR CM INJ ANY COLONIC TUBE W/FLUORO: IMG2336

## 2021-03-20 MED ORDER — IOHEXOL 300 MG/ML  SOLN: 50.0000 mL | Freq: Once | INTRAMUSCULAR | Status: DC | PRN

## 2021-03-20 NOTE — Progress Notes (Signed)
  Patient with some skin breakdown at Gtube stoma.  Discussed recommendations with Julien Girt, [place] RN.   She recommends OTC Desitin, apply to area, can cover with gauze but make sure bumper is cinched snug again to avoid leakage of gastric contents onto skin.   Discussed with patient and he verbalized understanding.  Iran Rowe S Amairani Shuey PA-C 03/20/2021 9:51 AM

## 2021-03-21 NOTE — Telephone Encounter (Signed)
Langley Gauss Foor attempted to contact patient's sister, [name], to reschedule the Palliative Consult. Received no answer, but left a vm with my information and letting her know we have earlier appts or if she would like to keep the appt on 04/03/21, we would need to reschedule the time due to Dr. Mariea Clonts seeing her brother.

## 2021-03-26 ENCOUNTER — Inpatient Hospital Stay: Payer: Medicare HMO | Admitting: Dietician

## 2021-03-26 ENCOUNTER — Other Ambulatory Visit: Payer: Self-pay

## 2021-03-26 ENCOUNTER — Inpatient Hospital Stay: Payer: Medicare HMO | Attending: Hematology and Oncology | Admitting: Hematology and Oncology

## 2021-03-26 ENCOUNTER — Inpatient Hospital Stay: Payer: Medicare HMO

## 2021-03-26 ENCOUNTER — Encounter: Payer: Self-pay | Admitting: Hematology and Oncology

## 2021-03-26 ENCOUNTER — Other Ambulatory Visit: Payer: Medicare HMO

## 2021-03-26 DIAGNOSIS — Z79899 Other long term (current) drug therapy: Secondary | ICD-10-CM | POA: Diagnosis not present

## 2021-03-26 DIAGNOSIS — D6481 Anemia due to antineoplastic chemotherapy: Secondary | ICD-10-CM

## 2021-03-26 DIAGNOSIS — C090 Malignant neoplasm of tonsillar fossa: Secondary | ICD-10-CM

## 2021-03-26 DIAGNOSIS — T451X5A Adverse effect of antineoplastic and immunosuppressive drugs, initial encounter: Secondary | ICD-10-CM | POA: Diagnosis not present

## 2021-03-26 DIAGNOSIS — Z5111 Encounter for antineoplastic chemotherapy: Secondary | ICD-10-CM | POA: Insufficient documentation

## 2021-03-26 DIAGNOSIS — C329 Malignant neoplasm of larynx, unspecified: Secondary | ICD-10-CM | POA: Insufficient documentation

## 2021-03-26 DIAGNOSIS — Z95828 Presence of other vascular implants and grafts: Secondary | ICD-10-CM

## 2021-03-26 DIAGNOSIS — G893 Neoplasm related pain (acute) (chronic): Secondary | ICD-10-CM | POA: Diagnosis not present

## 2021-03-26 DIAGNOSIS — C321 Malignant neoplasm of supraglottis: Secondary | ICD-10-CM

## 2021-03-26 LAB — CMP (CANCER CENTER ONLY)
ALT: 12 U/L (ref 0–44)
AST: 12 U/L — ABNORMAL LOW (ref 15–41)
Albumin: 3.2 g/dL — ABNORMAL LOW (ref 3.5–5.0)
Alkaline Phosphatase: 58 U/L (ref 38–126)
Anion gap: 9 (ref 5–15)
BUN: 17 mg/dL (ref 8–23)
CO2: 30 mmol/L (ref 22–32)
Calcium: 9.6 mg/dL (ref 8.9–10.3)
Chloride: 97 mmol/L — ABNORMAL LOW (ref 98–111)
Creatinine: 0.67 mg/dL (ref 0.61–1.24)
GFR, Estimated: >60 mL/min (ref >60)
Glucose, Bld: 151 mg/dL — ABNORMAL HIGH (ref 70–99)
Potassium: 4.1 mmol/L (ref 3.5–5.1)
Sodium: 136 mmol/L (ref 135–145)
Total Bilirubin: <0.2 mg/dL — ABNORMAL LOW (ref 0.3–1.2)
Total Protein: 6.9 g/dL (ref 6.5–8.1)

## 2021-03-26 LAB — CBC WITH DIFFERENTIAL (CANCER CENTER ONLY)
Abs Immature Granulocytes: 0.2 10*3/uL — ABNORMAL HIGH (ref 0.00–0.07)
Basophils Absolute: 0.1 10*3/uL (ref 0.0–0.1)
Basophils Relative: 1 %
Eosinophils Absolute: 0 10*3/uL (ref 0.0–0.5)
Eosinophils Relative: 0 %
HCT: 24.1 % — ABNORMAL LOW (ref 39.0–52.0)
Hemoglobin: 8.1 g/dL — ABNORMAL LOW (ref 13.0–17.0)
Immature Granulocytes: 2 %
Lymphocytes Relative: 5 %
Lymphs Abs: 0.6 10*3/uL — ABNORMAL LOW (ref 0.7–4.0)
MCH: 31.6 pg (ref 26.0–34.0)
MCHC: 33.6 g/dL (ref 30.0–36.0)
MCV: 94.1 fL (ref 80.0–100.0)
Monocytes Absolute: 1 10*3/uL (ref 0.1–1.0)
Monocytes Relative: 8 %
Neutro Abs: 11 10*3/uL — ABNORMAL HIGH (ref 1.7–7.7)
Neutrophils Relative %: 84 %
Platelet Count: 310 10*3/uL (ref 150–400)
RBC: 2.56 MIL/uL — ABNORMAL LOW (ref 4.22–5.81)
RDW: 14.1 % (ref 11.5–15.5)
WBC Count: 12.8 10*3/uL — ABNORMAL HIGH (ref 4.0–10.5)
nRBC: 0 % (ref 0.0–0.2)

## 2021-03-26 LAB — PREPARE RBC (CROSSMATCH)

## 2021-03-26 LAB — ABO/RH: ABO/RH(D): A POS

## 2021-03-26 LAB — SAMPLE TO BLOOD BANK

## 2021-03-26 MED ORDER — SODIUM CHLORIDE 0.9 % IV SOLN
Freq: Once | INTRAVENOUS | Status: AC
  Filled 2021-03-26: qty 250

## 2021-03-26 MED ORDER — SODIUM CHLORIDE 0.9 % IV SOLN
75.0000 mg/m2 | Freq: Once | INTRAVENOUS | Status: AC
  Administered 2021-03-26: 120 mg via INTRAVENOUS
  Filled 2021-03-26: qty 12

## 2021-03-26 MED ORDER — OXYCODONE HCL 5 MG PO TABS: 5.0000 mg | ORAL_TABLET | Freq: Four times a day (QID) | ORAL | 0 refills | Status: DC | PRN

## 2021-03-26 MED ORDER — SODIUM CHLORIDE 0.9% FLUSH
10.0000 mL | INTRAVENOUS | Status: DC | PRN
  Administered 2021-03-26: 10 mL
  Filled 2021-03-26: qty 10

## 2021-03-26 MED ORDER — DEXAMETHASONE 2 MG PO TABS: 2.0000 mg | ORAL_TABLET | Freq: Every day | ORAL | 0 refills | Status: DC

## 2021-03-26 MED ORDER — HEPARIN SOD (PORK) LOCK FLUSH 100 UNIT/ML IV SOLN
500.0000 [IU] | Freq: Once | INTRAVENOUS | Status: AC | PRN
  Administered 2021-03-26: 500 [IU]
  Filled 2021-03-26: qty 5

## 2021-03-26 MED ORDER — SODIUM CHLORIDE 0.9 % IV SOLN
10.0000 mg | Freq: Once | INTRAVENOUS | Status: AC
  Administered 2021-03-26: 10 mg via INTRAVENOUS
  Filled 2021-03-26: qty 10

## 2021-03-26 MED ORDER — SODIUM CHLORIDE 0.9% FLUSH
10.0000 mL | Freq: Once | INTRAVENOUS | Status: AC
  Administered 2021-03-26: 10 mL
  Filled 2021-03-26: qty 10

## 2021-03-26 NOTE — Progress Notes (Signed)
Nutrition Follow-up:   Patient with laryngeal cancer. He completed concurrent chemoradiation therapy 2/22. Patient is currently receiving single agent docetaxel for residual disease.   Recent hospital admission (6/27-6/30) for laryngeal edema with airway narrowing s/p laryngoscopy on 6/28.   Met with patient in infusion. He reports feeling better after recent hospitalization. He continues to have a sore throat, patient says he is using tube for all nutrition, hydration, and medication. Patient reports adjustments made to PEG by IR during admission, states it is no longer leaking and functioning properly. Patient reports ongoing nausea after feedings, states he thinks this is because he is laying down directly after bolus feedings. Patient is crushing medications and giving through tube. He is asking about his Drisdol, says he has pin poking a hole in capsule and squeezing liquid into PEG. Patient wanting to know if this is ok for him to do or if there is another way to administer.    Medications: Decadron, Zofran, Compazine  Labs: Glucose 151  Anthropometrics: Weight 111 lb 8 oz today decreased from 113 lb 3.2 oz on 6/22 and 116 lb 6.4 oz on 6/9. Patient has lost additional 5 lbs (4.3%) in one month; concerning   Estimated Energy Needs  Kcals: 2000-2200 Protein: 80-95 Fluid: 2.2 L  NUTRITION DIAGNOSIS: Unintentional weight loss ongoing   MALNUTRITION DIAGNOSIS: Noted severe malnutrition related to chronic illness diagnosed by inpatient RD on 6/29   INTERVENTION:  Educated pt to allow 60 minutes after feeding before lying down Suggested increasing from 3 to 4 feedings daily to decrease volume at each feeding  - Osmolite 1.5 - give 1 1/2 cartons QID with 90 ml free water flush before and after each feeding Spoke with pharmacy, if Drisdol capsule is not working for pt option for ergocalciferol oral solution (8000 IU/mL) via PEG, message left for sister with this information.  Continue  to take nausea medications as prescribed Pt has contact information     MONITORING, EVALUATION, GOAL: weight trends, tube feeding   NEXT VISIT: Tuesday August 2 in infusion

## 2021-03-26 NOTE — Assessment & Plan Note (Signed)
This is a very 65 pleasant male patient with laryngeal cancer s/p previous definitive RT in 2020 and most recently had concurrent CRT completed 10/2020 He had end of treatment PET scan which showed bilateral lymphadenopathy which is concerning for residual disease. He is unfortunately not a candidate for any more radiation and surgery. He was recommended palliative chemotherapy or immunotherapy He is PDL1 neg, hence he may not benefit from [facility] We agreed to try single agent docetaxel at 75% of dose. He tolerated first cycle well except he felt SOB and went to the hospital, put on short course dex. He feels better today, pain is controlled on BID oxycodone, would like to proceed with treatment. Ok to proceed with chemo if labs are within parameters.

## 2021-03-26 NOTE — Assessment & Plan Note (Signed)
On oxycodone 5 mg BID PRN for pain. Will refill this for pain management.

## 2021-03-26 NOTE — Assessment & Plan Note (Signed)
Hb at 8 g/dl Will arrange 1 unit of PRBC sometime this week

## 2021-03-26 NOTE — Progress Notes (Signed)
[place] FOLLOW UP NOTE  Patient Care Team: [name], MD as PCP - General (Internal Medicine) Izora Gala, MD as Consulting Physician (Otolaryngology) [name], MD as Attending Physician (Radiation Oncology) Leota Sauers, RN (Inactive) as Oncology Nurse Navigator Karie Mainland, RD as Dietitian (Nutrition) Sharen Counter, CCC-SLP as Speech Language Pathologist (Speech Pathology) Kennith Center, LCSW as Social Worker [name], MD as Consulting Physician (Hematology and Oncology) Malmfelt, Stephani Police, RN as Oncology Nurse Navigator  CHIEF COMPLAINTS/PURPOSE OF CONSULTATION:  Follow up to discuss imaging results and treatment recommendations.  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 He is unfortunately not a candidate for any more radiation and surgery. He was recommended palliative chemotherapy or immunotherapy He is PDL1 neg, hence he may not benefit from [facility] We agreed to try single agent docetaxel at 75% of dose. He tolerated first cycle well except he felt SOB and went to the hospital, put on short course dex. He feels better today, pain is controlled on BID oxycodone, would like to proceed with treatment. Ok to proceed with chemo if labs are within parameters.  Anemia due to antineoplastic chemotherapy Hb at 8 g/dl Will arrange 1 unit of PRBC sometime this week  Cancer related pain On oxycodone 5 mg BID PRN for pain. Will refill this for pain management.  No orders of the defined types were placed in this encounter.   HISTORY OF PRESENTING ILLNESS:   [name] 70 y.o. male is here for follow up after his most recent PET  CT  Oncology History Overview Note    [name] is a 70 y.o. male who has been treated for laryngeal cancer in the past.   Aug 2020, Pt had presented with dysphagia and difficulty breathing through his mouth, 20 lb weight loss. He saw Dr. Erik Obey who performed laryngoscopy and appreciated a 2 to 3 cm ovoid pedunculated mass arising from the right aryepiglottic fold and impinging on the supraglottic airway.  The vocal cords appeared to be mobile.  Urgent panendoscopy and biopsy were recommended to protect his airway.   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 According to his op note, a bulky necrotic and semi-pedunculated tumor coming off the lateral surface of the right aryepiglottic fold and the anterior and lateral aspect of the piriform sinus on the right side.  Airway was compromised by the tumor mass at intubation.   CT scan of the neck on 04/11/2019 showed glottic closure with no asymmetry of the cords, correlate with laryngoscopy results.  Questionable right level 2 lymph node measured 7 mm.  No definite pathologic lymph nodes in the neck.   PET scan on 04/22/2019 revealed mild residual activity in the posterior right hypopharynx confined to the mucosa with no evidence of metastatic disease to the neck or distantly.  He underwent definitive radiotherapy to T2N0 laryngeal cancer  since he refused surgical treatment.  He then had PET imaging which showed a right neck lymph node, suspicious on PET. Biopsy of right neck lymph node is positive for recurrence. He underwent right modified radical neck dissection including levels 2 and 3, including the internal  jugular vein, suture marks the inferior jugular vein stump.  He most recently underwent a soft tissue neck CT scan on the date of 07/05/2020 that revealed asymmetric soft tissue thickening and enhancement along the right lateral pharynx. It also showed  suspicious small 6 mm hyperenhancing nodular soft tissue along the posterior margin of the neck dissect at right level 3. Additionally, there was a mild inflammatory appearing right upper lobe centrilobular ground-glass opacity that was new (consider mild or developing upper lobe infection), post radiation changes to the lung apices, and sequelae of radiation and right neck dissection from previous neck CT.   Subsequently, the patient underwent a direct laryngoscopy and biopsy of oropharyngeal mass on the date of 08/13/2020. Pathology from the procedure revealed poorly differentiated squamous cell carcinoma.  PET showed recurrence of head and neck cancer with broad hypermetabolic pharyngeal mucosal lesion involving right lateral posterior oropharynx and hypopharynx extending from palatine tonsil to the vallecula.Hypermetabolic lymph node posterior sternocleidomastoid muscle on the RIGHT (level 3).Hypermetabolic nodule within the LEFT parotid glands favored small primary parotid neoplasm.  Recommendation was to consider proceeding with upfront concurrent chemo radiation followed by salvage surgery if needed. He completed 6 weekly cycles of chemotherapy and concurrent radiation, 10/2020.  PET scan 01/31/2021 with worsening nodal disease in the left neck and a right supraclavicular LN as discussed. Post treatment changes with soft tissue fullness in the area of the prior tumor but with markedly diminished metabolic activity.     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 SEE NOTE   04/11/2019 Imaging   CT  neck: IMPRESSION: The glottis is closed with suboptimal evaluation of the vocal cords. No asymmetry of the cords. Correlate with laryngoscopy results.   No enlarged lymph nodes in the neck. 7 mm right level 2 lymph node may be reactive. No definite pathologic lymph nodes in the neck.   04/22/2019 Imaging   PET: IMPRESSION: 1. Mild residual activity in the posterior RIGHT hypopharynx confined to the mucosa. 2. No evidence of hypermetabolic metastatic lymph nodes in LEFT or RIGHT neck. 3. No evidence distant metastatic disease.   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 He cant quite speak because of hoarseness and pain in his throat. He says he  went to the hospital with shortness of breath, had some steroids on discharge, and he felt better on steroids He could breathe and swallow better with steroids Pain in the throat has been bothering him, he has been taking oxycodone BID Spitting up and nausea are pretty  much the same, hasnt gotten worse. No change in bowel or urinary habits NO new neurological complaints.  MEDICAL HISTORY:  Past Medical History:  Diagnosis Date   Cancer ([place])    Throat cancer 2019   ETOH abuse    Frequent urination    Glaucoma    Hepatitis C virus infection without hepatic coma    dx'ed in 11/2018   History of radiation therapy 05/12/19- 07/06/19   Larynx   Hypertension    Wears denture    upper only; lost lower denture     SURGICAL HISTORY: Past Surgical History:  Procedure Laterality Date   ANKLE SURGERY  2011   right ankle   COLONOSCOPY  02/2019   polyps - Dr Havery Moros   DIRECT LARYNGOSCOPY N/A 04/01/2019   Procedure: DIRECT LARYNGOSCOPY WITH BIOPSY;  Surgeon: Jodi Marble, MD;  Location: [place];  Service: ENT;  Laterality: N/A;   DIRECT LARYNGOSCOPY N/A 01/06/2020   Procedure: DIRECT LARYNGOSCOPY;  Surgeon: Izora Gala, MD;  Location: [place];  Service: ENT;  Laterality: N/A;   DIRECT LARYNGOSCOPY N/A 08/13/2020   Procedure: DIRECT LARYNGOSCOPY;  Surgeon: Izora Gala, MD;  Location: [place];  Service: ENT;  Laterality: N/A;   ESOPHAGOSCOPY N/A 04/01/2019   Procedure: ESOPHAGOSCOPY;  Surgeon: Jodi Marble, MD;  Location: [place];  Service: ENT;  Laterality: N/A;   EXCISION ORAL TUMOR Right 08/13/2020   Procedure: BIOPSY OF OROPHARYNGEAL MASS;  Surgeon: Izora Gala, MD;  Location: [place];  Service: ENT;  Laterality: Right;   EYE SURGERY Right    IR CM INJ ANY COLONIC TUBE W/FLUORO  12/20/2020   IR CM INJ ANY COLONIC TUBE W/FLUORO  03/20/2021   IR GASTROSTOMY TUBE MOD SED  09/24/2020   IR IMAGING GUIDED PORT INSERTION  09/24/2020   KNEE SURGERY     LARYNGOSCOPY AND BRONCHOSCOPY N/A 04/01/2019   Procedure: BRONCHOSCOPY;  Surgeon: Jodi Marble, MD;  Location: [place];  Service: ENT;  Laterality: N/A;   RADICAL NECK DISSECTION N/A 01/06/2020   Procedure: RADICAL NECK DISSECTION;  Surgeon: Izora Gala, MD;  Location: [facility];  Service: ENT;  Laterality: N/A;    SOCIAL HISTORY: Social History   Socioeconomic History   Marital status: Single    Spouse name: Not on file   Number of children: 2   Years of education: Not on file   Highest education level: Not on file  Occupational History   Not on file  Tobacco Use   Smoking status: Former    Years: 50.00    Pack years: 0.00    Types: Cigarettes    Quit date: 06/01/2020    Years since quitting: 0.8   Smokeless  tobacco: Never  Vaping Use   Vaping Use: Never used  Substance and Sexual Activity   Alcohol use: Yes    Alcohol/week: 4.0 standard drinks    Types: 4 Cans of beer per week    Comment: 40oz beer daily   Drug use: Yes    Types: Cocaine    Comment: none in 2 yrs   Sexual activity: Yes    Partners: Female  Other Topics Concern   Not on file  Social History Narrative   Patient is divorced with 2 children.   Patient is currently living with his sister.   Patient with a history of smoking a third of pack of cigarettes daily for 50 years.  Patient currently smoking 2 to 3 cigarettes/day.   Patient has never used  smokeless tobacco.   Patient with occasional use of alcohol.   Patient last used cocaine approximately 6 months ago.  Patient denies use of marijuana.   Social Determinants of Health   Financial Resource Strain: Not on file  Food Insecurity: No Food Insecurity   Worried About Charity fundraiser in the Last Year: Never true   Ran Out of Food in the Last Year: Never true  Transportation Needs: No Transportation Needs   Lack of Transportation (Medical): No   Lack of Transportation (Non-Medical): No  Physical Activity: Not on file  Stress: Not on file  Social Connections: Unknown   Frequency of Communication with Friends and Family: Three times a week   Frequency of Social Gatherings with Friends and Family: More than three times a week   Attends Religious Services: Not on file   Active Member of Clubs or Organizations: Not on file   Attends Archivist Meetings: Not on file   Marital Status: Not on file  Intimate Partner Violence: Not on file    FAMILY HISTORY: Family History  Problem Relation Age of Onset   Breast cancer Sister    Colon cancer Brother 5       ????   Cancer Brother     ALLERGIES:  has No Known Allergies.  MEDICATIONS:  Current Outpatient Medications  Medication Sig Dispense Refill   atropine 1 % ophthalmic solution Place 1 drop into  the right eye 2 (two) times a day.      dexamethasone (DECADRON) 2 MG tablet Take 1 tablet (2 mg total) by mouth daily. 30 tablet 0   dexamethasone (DECADRON) 4 MG tablet Take 2 tablets (8 mg total) by mouth 2 (two) times daily. Start the day before Taxotere. Then daily after chemo for 2 days. 30 tablet 1   lidocaine-prilocaine (EMLA) cream Apply to affected area once (Patient taking differently: Apply 1 application topically daily as needed (access port).) 30 g 3   losartan (COZAAR) 50 MG tablet Take 50 mg by mouth daily.     Nutritional Supplements (FEEDING SUPPLEMENT, OSMOLITE 1.5 CAL,) LIQD Give 1 carton Osmolite 1.5 via PEG 4 times daily with 90 mL free water before and after bolus feeding.  Increase to 1-1/2 cartons 4 times daily.  In addition, drink or flush tube with an additional 240 mL free water twice daily.  This provides 2130 cal, 89.4 g protein and 2352 mL free water which is 100% estimated needs. 1422 mL 0   ondansetron (ZOFRAN) 8 MG tablet Take 1 tablet (8 mg total) by mouth 2 (two) times daily as needed for refractory nausea / vomiting. 30 tablet 1   prednisoLONE acetate (PRED FORTE) 1 % ophthalmic suspension Place 1 drop into the right eye 4 (four) times daily.     prochlorperazine (COMPAZINE) 10 MG tablet Take 1 tablet (10 mg total) by mouth every 6 (six) hours as needed (Nausea or vomiting). 30 tablet 1   tolterodine (DETROL LA) 4 MG 24 hr capsule Take 4 mg by mouth daily.     Vitamin D, Ergocalciferol, (DRISDOL) 1.25 MG (50000 UNIT) CAPS capsule Take 50,000 Units by mouth once a week.     oxyCODONE (OXY IR/ROXICODONE) 5 MG immediate release tablet Take 1 tablet (5 mg total) by mouth every 6 (six) hours as needed for moderate pain. 60 tablet 0   No current facility-administered medications for this visit.   Facility-Administered Medications Ordered in Other Visits  Medication Dose Route Frequency Provider Last  Rate Last Admin   dexamethasone (DECADRON) 10 mg in sodium chloride  0.9 % 50 mL IVPB  10 mg Intravenous Once Liylah Najarro, MD       DOCEtaxel (TAXOTERE) 120 mg in sodium chloride 0.9 % 250 mL chemo infusion  75 mg/m2 (Treatment Plan Recorded) Intravenous Once Tasha Jindra, MD       heparin lock flush 100 unit/mL  500 Units Intracatheter Once PRN Delrick Dehart, MD       sodium chloride flush (NS) 0.9 % injection 10 mL  10 mL Intracatheter PRN Jemiah Cuadra, MD        PHYSICAL EXAMINATION:   ECOG PERFORMANCE STATUS: 0 - Asymptomatic  Vitals:   03/26/21 1214  BP: 126/83  Pulse: 99  Resp: 18  Temp: 98.5 F (36.9 C)  SpO2: 100%   Filed Weights   03/26/21 1214  Weight: 111 lb 8 oz (50.6 kg)   Physical Exam Constitutional:      General: He is not in acute distress.    Comments: Frail   HENT:     Head: Normocephalic.     Mouth/Throat:     Mouth: Mucous membranes are moist.     Pharynx: No oropharyngeal exudate or posterior oropharyngeal erythema.  Cardiovascular:     Rate and Rhythm: Normal rate and regular rhythm.  Pulmonary:     Effort: Pulmonary effort is normal.  Abdominal:     General: Abdomen is flat.     Palpations: Abdomen is soft.     Comments: G tube appears ok, dressing in place, no clear evidence of infection   Musculoskeletal:        General: No swelling or tenderness.     Cervical back: Normal range of motion and neck supple. No rigidity.  Lymphadenopathy:     Cervical: No cervical adenopathy (Possible palpable lymphadenopathy left neck, hard to appreciate on phy exam because of his previous radiation and associated skin changes).  Skin:    General: Skin is warm and dry.  Neurological:     General: No focal deficit present.     Mental Status: He is alert.  Psychiatric:        Mood and Affect: Mood normal.   No change in exam from last visit.  LABORATORY DATA:  I have reviewed the data as listed Lab Results  Component Value Date   WBC 12.8 (H) 03/26/2021   HGB 8.1 (L) 03/26/2021   HCT 24.1 (L) 03/26/2021    MCV 94.1 03/26/2021   PLT 310 03/26/2021     Chemistry      Component Value Date/Time   NA 136 03/26/2021 1159   K 4.1 03/26/2021 1159   CL 97 (L) 03/26/2021 1159   CO2 30 03/26/2021 1159   BUN 17 03/26/2021 1159   CREATININE 0.67 03/26/2021 1159   CREATININE 0.61 (L) 10/26/2019 1523      Component Value Date/Time   CALCIUM 9.6 03/26/2021 1159   ALKPHOS 58 03/26/2021 1159   AST 12 (L) 03/26/2021 1159   ALT 12 03/26/2021 1159   ALT 53 (H) 09/06/2019 1543   BILITOT <0.2 (L) 03/26/2021 1159       RADIOGRAPHIC STUDIES: I have personally reviewed the radiological images as listed and agreed with the findings in the report. CT Soft Tissue Neck W Contrast  Result Date: 03/11/2021 CLINICAL DATA:  Worsening shortness of breath since yesterday. Stridor. Tumor in the throat being treated with chemotherapy. EXAM: CT NECK WITH CONTRAST TECHNIQUE: Multidetector CT imaging of  the neck was performed using the standard protocol following the bolus administration of intravenous contrast. CONTRAST:  65m OMNIPAQUE IOHEXOL 300 MG/ML  SOLN COMPARISON:  PET scan 01/31/2021.  CT 07/05/2020. FINDINGS: Pharynx and larynx: Increased edema of the mucosa an the parapharyngeal space throughout the mid upper neck consistent with acute radiation change. There does not appear to be focal tumor responsible for this. This results in airway narrowing and could possibly result in critical narrowing. Salivary glands: Parotid and submandibular glands do not show acute disease. Some volume loss probably due to previous radiation. Thyroid: Normal Lymph nodes: Redemonstration of recurrent malignant lymphadenopathy in the left neck, level 2 and 3. This extends over a length of about 3-4 cm with maximal transverse diameter of 21 mm. Similar appearance to the recent PET scan. Additional lymphadenopathy in the low right supraclavicular region just above and behind the clavicular head, measuring about 18 mm in diameter. No new  area of recurrent nodal disease is seen compared to the previous PET scan. Vascular: No acute vascular finding. Limited intracranial: Negative Visualized orbits: Normal Mastoids and visualized paranasal sinuses: Clear Skeleton: Chronic cervical spondylosis. Upper chest: Negative Other: None IMPRESSION: Diffuse pharyngeal and laryngeal mucosal and submucosal edema probably related to acute radiation injury. The airway is narrow and could possibly be critically narrowed. I do not see any evidence of recurrent mucosal or submucosal tumor identifiable. Similar appearance of the recurrent malignant lymphadenopathy on the left at level 2 and level 3 and on the right in the low supraclavicular region just above and behind the clavicular head. Electronically Signed   By: MNelson ChimesM.D.   On: 03/11/2021 17:53   IR Cm Inj Any Colonic Tube W/Fluoro  Result Date: 03/20/2021 INDICATION: 70year old gentleman with gastrostomy tube returns to interventional radiology with leaking around gastrostomy tube. EXAM: Fluoroscopy guided repositioning of gastrostomy tube MEDICATIONS: None ANESTHESIA/SEDATION: None CONTRAST:  5 mL of Omnipaque 350-administered into the gastric lumen. FLUOROSCOPY TIME:  Fluoroscopy Time: 0.4 minutes (0.7 mGy). COMPLICATIONS: None immediate. PROCEDURE: Informed written consent was obtained from the patient after a thorough discussion of the procedural risks, benefits and alternatives. All questions were addressed. Maximal Sterile Barrier Technique was utilized including caps, mask, sterile gowns, sterile gloves, sterile drape, hand hygiene and skin antiseptic. A timeout was performed prior to the initiation of the procedure. Contrast administered through the gastrostomy tube showed appropriate positioning within the lumen. There was slack between the bumper and mushroom of the gastrostomy tube. The slack was reduced as this is likely responsible for patient's leakage. IMPRESSION: Leakage of gastrostomy  tube likely secondary to incomplete apposition of retention disc is against anterior abdominal wall. External disc was cinched against the patient's skin, which should decreased the leakage. The patient was taught how to adjust the disc if leakage again occurs. Some skin breakdown was noted around the G-tube insertion site. Patient was seen by stoma nurse who recommended OTC Desitin application to the area. Electronically Signed   By: FMiachel RouxM.D.   On: 03/20/2021 16:17    PDL1 negative < 1%  All questions were answered. The patient knows to call the clinic with any problems, questions or concerns.  I spent 30 minutes in the care of this patient including H and P, review of records, counseling and coordination of care. We reviewed treatment plan, anticipated adverse effects, reviewed labs and follow up recommendations.    PBenay Pike MD 03/26/2021 12:59 PM

## 2021-03-26 NOTE — Patient Instructions (Signed)
[facility]  Discharge Instructions: Thank you for choosing [facility] to provide your oncology and hematology care.   If you have a lab appointment with the [facility], please go directly to the [facility] and check in at the registration area.   Wear comfortable clothing and clothing appropriate for easy access to any Portacath or PICC line.   We strive to give you quality time with your provider. You may need to reschedule your appointment if you arrive late (15 or more minutes).  Arriving late affects you and other patients whose appointments are after yours.  Also, if you miss three or more appointments without notifying the office, you may be dismissed from the clinic at the provider's discretion.      For prescription refill requests, have your pharmacy contact our office and allow 72 hours for refills to be completed.    Today you received the following chemotherapy and/or immunotherapy agents Taxotere     To help prevent nausea and vomiting after your treatment, we encourage you to take your nausea medication as directed.  BELOW ARE SYMPTOMS THAT SHOULD BE REPORTED IMMEDIATELY: *FEVER GREATER THAN 100.4 F (38 C) OR HIGHER *CHILLS OR SWEATING *NAUSEA AND VOMITING THAT IS NOT CONTROLLED WITH YOUR NAUSEA MEDICATION *UNUSUAL SHORTNESS OF BREATH *UNUSUAL BRUISING OR BLEEDING *URINARY PROBLEMS (pain or burning when urinating, or frequent urination) *BOWEL PROBLEMS (unusual diarrhea, constipation, pain near the anus) TENDERNESS IN MOUTH AND THROAT WITH OR WITHOUT PRESENCE OF ULCERS (sore throat, sores in mouth, or a toothache) UNUSUAL RASH, SWELLING OR PAIN  UNUSUAL VAGINAL DISCHARGE OR ITCHING   Items with * indicate a potential emergency and should be followed up as soon as possible or go to the Emergency Department if any problems should occur.  Please show the CHEMOTHERAPY ALERT CARD or IMMUNOTHERAPY ALERT CARD at check-in to the  Emergency Department and triage nurse.  Should you have questions after your visit or need to cancel or reschedule your appointment, please contact [facility]  Dept: [phone]  and follow the prompts.  Office hours are 8:00 a.m. to 4:30 p.m. Monday - Friday. Please note that voicemails left after 4:00 p.m. may not be returned until the following business day.  We are closed weekends and major holidays. You have access to a nurse at all times for urgent questions. Please call the main number to the clinic Dept: [phone] and follow the prompts.   For any non-urgent questions, you may also contact your provider using MyChart. We now offer e-Visits for anyone 14 and older to request care online for non-urgent symptoms. For details visit [email or web address].   Also download the MyChart app! Go to the app store, search "MyChart", open the app, select [facility], and log in with your MyChart username and password.  Due to Covid, a mask is required upon entering the hospital/clinic. If you do not have a mask, one will be given to you upon arrival. For doctor visits, patients may have 1 support person aged 33 or older with them. For treatment visits, patients cannot have anyone with them due to current Covid guidelines and our immunocompromised population.    Docetaxel injection What is this medication? DOCETAXEL (doe se TAX el) is a chemotherapy drug. It targets fast dividing cells, like cancer cells, and causes these cells to die. This medicine is used to treat many types of cancers like breast cancer, certain stomach cancers,head and neck cancer, lung  cancer, and prostate cancer. This medicine may be used for other purposes; ask your health care provider orpharmacist if you have questions. COMMON BRAND NAME(S): Docefrez, Taxotere What should I tell my care team before I take this medication? They need to know if you have any of these conditions: infection  (especially a virus infection such as chickenpox, cold sores, or herpes) liver disease low blood counts, like low white cell, platelet, or red cell counts an unusual or allergic reaction to docetaxel, polysorbate 80, other chemotherapy agents, other medicines, foods, dyes, or preservatives pregnant or trying to get pregnant breast-feeding How should I use this medication? This drug is given as an infusion into a vein. It is administered in a hospitalor clinic by a specially trained health care professional. Talk to your pediatrician regarding the use of this medicine in children.Special care may be needed. Overdosage: If you think you have taken too much of this medicine contact apoison control center or emergency room at once. NOTE: This medicine is only for you. Do not share this medicine with others. What if I miss a dose? It is important not to miss your dose. Call your doctor or health careprofessional if you are unable to keep an appointment. What may interact with this medication? Do not take this medicine with any of the following medications: live virus vaccines This medicine may also interact with the following medications: aprepitant certain antibiotics like erythromycin or clarithromycin certain antivirals for HIV or hepatitis certain medicines for fungal infections like fluconazole, itraconazole, ketoconazole, posaconazole, or voriconazole cimetidine ciprofloxacin conivaptan cyclosporine dronedarone fluvoxamine grapefruit juice imatinib verapamil This list may not describe all possible interactions. Give your health care provider a list of all the medicines, herbs, non-prescription drugs, or dietary supplements you use. Also tell them if you smoke, drink alcohol, or use illegaldrugs. Some items may interact with your medicine. What should I watch for while using this medication? Your condition will be monitored carefully while you are receiving this medicine. You will  need important blood work done while you are taking thismedicine. Call your doctor or health care professional for advice if you get a fever, chills or sore throat, or other symptoms of a cold or flu. Do not treat yourself. This drug decreases your body's ability to fight infections. Try toavoid being around people who are sick. Some products may contain alcohol. Ask your health care professional if this medicine contains alcohol. Be sure to tell all health care professionals you are taking this medicine. Certain medicines, like metronidazole and disulfiram, can cause an unpleasant reaction when taken with alcohol. The reaction includes flushing, headache, nausea, vomiting, sweating, and increased thirst. Thereaction can last from 30 minutes to several hours. You may get drowsy or dizzy. Do not drive, use machinery, or do anything that needs mental alertness until you know how this medicine affects you. Do not stand or sit up quickly, especially if you are an older patient. This reduces the risk of dizzy or fainting spells. Alcohol may interfere with the effect ofthis medicine. Talk to your health care professional about your risk of cancer. You may bemore at risk for certain types of cancer if you take this medicine. Do not become pregnant while taking this medicine or for 6 months after stopping it. Women should inform their doctor if they wish to become pregnant or think they might be pregnant. There is a potential for serious side effects to an unborn child. Talk to your health care professional or pharmacist for  more information. Do not breast-feed an infant while taking this medicine orfor 1 week after stopping it. Males who get this medicine must use a condom during sex with females who can get pregnant. If you get a woman pregnant, the baby could have birth defects. The baby could die before they are born. You will need to continue wearing a condom for 3 months after stopping the medicine. Tell your  health care providerright away if your partner becomes pregnant while you are taking this medicine. This may interfere with the ability to father a child. You should talk to yourdoctor or health care professional if you are concerned about your fertility. What side effects may I notice from receiving this medication? Side effects that you should report to your doctor or health care professionalas soon as possible: allergic reactions like skin rash, itching or hives, swelling of the face, lips, or tongue blurred vision breathing problems changes in vision low blood counts - This drug may decrease the number of white blood cells, red blood cells and platelets. You may be at increased risk for infections and bleeding. nausea and vomiting pain, redness or irritation at site where injected pain, tingling, numbness in the hands or feet redness, blistering, peeling, or loosening of the skin, including inside the mouth signs of decreased platelets or bleeding - bruising, pinpoint red spots on the skin, black, tarry stools, nosebleeds signs of decreased red blood cells - unusually weak or tired, fainting spells, lightheadedness signs of infection - fever or chills, cough, sore throat, pain or difficulty passing urine swelling of the ankle, feet, hands Side effects that usually do not require medical attention (report to yourdoctor or health care professional if they continue or are bothersome): constipation diarrhea fingernail or toenail changes hair loss loss of appetite mouth sores muscle pain This list may not describe all possible side effects. Call your doctor for medical advice about side effects. You may report side effects to FDA at1-800-FDA-1088. Where should I keep my medication? This drug is given in a hospital or clinic and will not be stored at home. NOTE: This sheet is a summary. It may not cover all possible information. If you have questions about this medicine, talk to your  doctor, pharmacist, orhealth care provider.  2022 Elsevier/Gold Standard (2019-08-01 19:50:31)

## 2021-03-27 ENCOUNTER — Inpatient Hospital Stay: Payer: Medicare HMO

## 2021-03-27 DIAGNOSIS — D6481 Anemia due to antineoplastic chemotherapy: Secondary | ICD-10-CM

## 2021-03-27 DIAGNOSIS — Z5111 Encounter for antineoplastic chemotherapy: Secondary | ICD-10-CM | POA: Diagnosis not present

## 2021-03-27 MED ORDER — SODIUM CHLORIDE 0.9% FLUSH
10.0000 mL | INTRAVENOUS | Status: AC | PRN
Start: 2021-03-27 — End: 2021-03-27
  Administered 2021-03-27: 10 mL
  Filled 2021-03-27: qty 10

## 2021-03-27 MED ORDER — HEPARIN SOD (PORK) LOCK FLUSH 100 UNIT/ML IV SOLN
500.0000 [IU] | Freq: Every day | INTRAVENOUS | Status: AC | PRN
  Administered 2021-03-27: 500 [IU]
  Filled 2021-03-27: qty 5

## 2021-03-27 MED ORDER — SODIUM CHLORIDE 0.9% IV SOLUTION
250.0000 mL | Freq: Once | INTRAVENOUS | Status: AC
  Administered 2021-03-27: 250 mL via INTRAVENOUS
  Filled 2021-03-27: qty 250

## 2021-03-27 NOTE — Patient Instructions (Signed)
Blood Transfusion, Adult, Care After This sheet gives you information about how to care for yourself after your procedure. Your doctor may also give you more specific instructions. If youhave problems or questions, contact your doctor. What can I expect after the procedure? After the procedure, it is common to have: Bruising and soreness at the IV site. A fever or chills on the day of the procedure. This may be your body's response to the new blood cells received. A headache. Follow these instructions at home: Insertion site care     Follow instructions from your doctor about how to take care of your insertion site. This is where an IV tube was put into your vein. Make sure you: Wash your hands with soap and water before and after you change your bandage (dressing). If you cannot use soap and water, use hand sanitizer. Change your bandage as told by your doctor. Check your insertion site every day for signs of infection. Check for: Redness, swelling, or pain. Bleeding from the site. Warmth. Pus or a bad smell. General instructions Take over-the-counter and prescription medicines only as told by your doctor. Rest as told by your doctor. Go back to your normal activities as told by your doctor. Keep all follow-up visits as told by your doctor. This is important. Contact a doctor if: You have itching or red, swollen areas of skin (hives). You feel worried or nervous (anxious). You feel weak after doing your normal activities. You have redness, swelling, warmth, or pain around the insertion site. You have blood coming from the insertion site, and the blood does not stop with pressure. You have pus or a bad smell coming from the insertion site. Get help right away if: You have signs of a serious reaction. This may be coming from an allergy or the body's defense system (immune system). Signs include: Trouble breathing or shortness of breath. Swelling of the face or feeling warm  (flushed). Fever or chills. Head, chest, or back pain. Dark pee (urine) or blood in the pee. Widespread rash. Fast heartbeat. Feeling dizzy or light-headed. You may receive your blood transfusion in an outpatient setting. If so, youwill be told whom to contact to report any reactions. These symptoms may be an emergency. Do not wait to see if the symptoms will go away. Get medical help right away. Call your local emergency services (911 in the [place] Do not drive yourself to the hospital. Summary Bruising and soreness at the IV site are common. Check your insertion site every day for signs of infection. Rest as told by your doctor. Go back to your normal activities as told by your doctor. Get help right away if you have signs of a serious reaction. This information is not intended to replace advice given to you by your health care provider. Make sure you discuss any questions you have with your healthcare provider. Document Revised: 02/24/2019 Document Reviewed: 02/24/2019 Elsevier Patient Education  2022 Elsevier Inc.  

## 2021-03-28 ENCOUNTER — Other Ambulatory Visit: Payer: Self-pay | Admitting: Hematology and Oncology

## 2021-03-28 ENCOUNTER — Other Ambulatory Visit: Payer: Self-pay

## 2021-03-28 ENCOUNTER — Inpatient Hospital Stay: Payer: Medicare HMO

## 2021-03-28 VITALS — BP 124/93 | HR 73 | Temp 98.3°F | Resp 18

## 2021-03-28 DIAGNOSIS — Z5111 Encounter for antineoplastic chemotherapy: Secondary | ICD-10-CM | POA: Diagnosis not present

## 2021-03-28 DIAGNOSIS — C090 Malignant neoplasm of tonsillar fossa: Secondary | ICD-10-CM

## 2021-03-28 LAB — TYPE AND SCREEN
ABO/RH(D): A POS
Antibody Screen: NEGATIVE
Unit division: 0

## 2021-03-28 LAB — BPAM RBC
Blood Product Expiration Date: 202208052359
ISSUE DATE / TIME: 202207131149
Unit Type and Rh: 6200

## 2021-03-28 MED ORDER — PEGFILGRASTIM-JMDB 6 MG/0.6ML ~~LOC~~ SOSY
6.0000 mg | PREFILLED_SYRINGE | Freq: Once | SUBCUTANEOUS | Status: AC
  Administered 2021-03-28: 6 mg via SUBCUTANEOUS

## 2021-03-28 MED ORDER — LIDOCAINE VISCOUS HCL 2 % MT SOLN: 10.0000 mL | Freq: Four times a day (QID) | OROMUCOSAL | 2 refills | Status: DC | PRN

## 2021-03-28 MED ORDER — PEGFILGRASTIM-JMDB 6 MG/0.6ML ~~LOC~~ SOSY
PREFILLED_SYRINGE | SUBCUTANEOUS | Status: AC
  Filled 2021-03-28: qty 0.6

## 2021-03-28 NOTE — Patient Instructions (Signed)

## 2021-03-28 NOTE — Progress Notes (Signed)
Viscous lidocaine sent for mucositis

## 2021-03-28 NOTE — Progress Notes (Signed)
Pt's feeding tube was leaking and RN Erin did dressing change and assessed area as well.

## 2021-04-03 ENCOUNTER — Other Ambulatory Visit: Payer: Medicaid Other | Admitting: Student

## 2021-04-05 ENCOUNTER — Encounter (HOSPITAL_COMMUNITY): Payer: Self-pay

## 2021-04-05 ENCOUNTER — Other Ambulatory Visit: Payer: Self-pay

## 2021-04-05 ENCOUNTER — Other Ambulatory Visit (HOSPITAL_COMMUNITY): Payer: Self-pay | Admitting: Diagnostic Radiology

## 2021-04-05 ENCOUNTER — Ambulatory Visit (HOSPITAL_COMMUNITY)
Admission: RE | Admit: 2021-04-05 | Discharge: 2021-04-05 | Disposition: A | Discharge status: Home / Self Care | Payer: Medicare HMO | Source: Ambulatory Visit | Attending: Diagnostic Radiology | Admitting: Diagnostic Radiology

## 2021-04-05 DIAGNOSIS — K9423 Gastrostomy malfunction: Secondary | ICD-10-CM

## 2021-04-08 ENCOUNTER — Encounter (HOSPITAL_COMMUNITY): Payer: Medicare HMO | Admitting: Dietician

## 2021-04-09 ENCOUNTER — Ambulatory Visit: Payer: Medicare HMO | Admitting: Dietician

## 2021-04-09 ENCOUNTER — Telehealth: Payer: Self-pay | Admitting: Dietician

## 2021-04-09 MED ORDER — KATE FARMS STANDARD 1.4 EN LIQD: 325.0000 mL | Freq: Four times a day (QID) | ENTERAL | Status: DC

## 2021-04-09 NOTE — Telephone Encounter (Signed)
Nutrition Follow-up:  Received call from care giver Verdene Lennert) with request to discuss concerns regarding tube feeds. Spoke with [place] and patient via telephone this morning. Verdene Lennert reports patient continues to have episodes of vomiting with tube feedings. Noted patient reported during recent hospital admission as well as 7/15 office visit he continues to drink alcohol (40 oz beer/day) Discussed this with patient, reports he has been putting alcohol in his tube. Patient says this is not every day, but regularly. He reports giving 16 oz beer via tube yesterday and had a "little bit" of moonshine over the weekend while at a cook out. Patient reports he waits 1 hour after alcohol intake before giving tube feeding. Patient reports episodes of nausea with vomiting after tube feedings without intake of alcohol and does not feel this is related.     Medications: reviewed  Labs: no new labs for review   Anthropometrics: Last weight 111 lb 8 oz (50.6 kg) on 7/11 decreased from 113 lb 3.2 oz on 6/22 and 116 lb 6.4 oz on 6/9.   5/2 - 122 lb 4.8 oz    Estimated Energy Needs (adjusted needs d/t weight trends)  Kcals: 1771-2024 (35 -40 kcal/kg) Protein: 86-100 g Fluid: 1.8 L  NUTRITION DIAGNOSIS: Unintentional weight loss ongoing   MALNUTRITION DIAGNOSIS: Severe malnutrition continues   INTERVENTION:  Strongly recommended patient not to give alcohol via PEG Will d/c Osmolite 1.5 due to reported poor toleration Will switch tube feeding formula to [employer] 1.4 Pt to give 1 carton (325 ml) [employer] QID. Flush tube with 90 ml free water before and after each feeding. Give addition 240 ml water via tube daily  -This will provide 1820 kcal, 80 grams protein, 1896 ml total water - meets 100% needs  MONITORING, EVALUATION, GOAL: weight trends, intake, tube feedings   NEXT VISIT: Tuesday, August 2 in infusion

## 2021-04-09 NOTE — Progress Notes (Signed)
See telephone note.

## 2021-04-12 ENCOUNTER — Inpatient Hospital Stay (HOSPITAL_COMMUNITY)
Admission: EM | Admit: 2021-04-12 | Discharge: 2021-04-22 | DRG: 329 | Disposition: A | Discharge status: Home Health | Payer: Medicare HMO | Attending: Internal Medicine | Admitting: Internal Medicine

## 2021-04-12 ENCOUNTER — Observation Stay (HOSPITAL_COMMUNITY): Payer: Medicare HMO | Admitting: Anesthesiology

## 2021-04-12 ENCOUNTER — Encounter (HOSPITAL_COMMUNITY): Payer: Self-pay

## 2021-04-12 ENCOUNTER — Emergency Department (HOSPITAL_COMMUNITY): Payer: Medicare HMO

## 2021-04-12 ENCOUNTER — Other Ambulatory Visit: Payer: Self-pay

## 2021-04-12 ENCOUNTER — Encounter (HOSPITAL_COMMUNITY)
Admission: EM | Disposition: A | Discharge status: Home Health | Payer: Self-pay | Source: Home / Self Care | Attending: Internal Medicine

## 2021-04-12 ENCOUNTER — Inpatient Hospital Stay: Payer: Self-pay

## 2021-04-12 ENCOUNTER — Inpatient Hospital Stay: Admit: 2021-04-12 | Payer: Medicare HMO | Admitting: Surgery

## 2021-04-12 DIAGNOSIS — K265 Chronic or unspecified duodenal ulcer with perforation: Secondary | ICD-10-CM | POA: Diagnosis present

## 2021-04-12 DIAGNOSIS — T451X5A Adverse effect of antineoplastic and immunosuppressive drugs, initial encounter: Secondary | ICD-10-CM | POA: Diagnosis present

## 2021-04-12 DIAGNOSIS — E43 Unspecified severe protein-calorie malnutrition: Secondary | ICD-10-CM | POA: Insufficient documentation

## 2021-04-12 DIAGNOSIS — Z95828 Presence of other vascular implants and grafts: Secondary | ICD-10-CM | POA: Diagnosis not present

## 2021-04-12 DIAGNOSIS — E8809 Other disorders of plasma-protein metabolism, not elsewhere classified: Secondary | ICD-10-CM | POA: Diagnosis present

## 2021-04-12 DIAGNOSIS — K9421 Gastrostomy hemorrhage: Secondary | ICD-10-CM | POA: Diagnosis present

## 2021-04-12 DIAGNOSIS — Z803 Family history of malignant neoplasm of breast: Secondary | ICD-10-CM

## 2021-04-12 DIAGNOSIS — K660 Peritoneal adhesions (postprocedural) (postinfection): Secondary | ICD-10-CM | POA: Diagnosis present

## 2021-04-12 DIAGNOSIS — C770 Secondary and unspecified malignant neoplasm of lymph nodes of head, face and neck: Secondary | ICD-10-CM | POA: Diagnosis present

## 2021-04-12 DIAGNOSIS — R5381 Other malaise: Secondary | ICD-10-CM | POA: Diagnosis present

## 2021-04-12 DIAGNOSIS — Z681 Body mass index (BMI) 19 or less, adult: Secondary | ICD-10-CM

## 2021-04-12 DIAGNOSIS — F1010 Alcohol abuse, uncomplicated: Secondary | ICD-10-CM | POA: Diagnosis present

## 2021-04-12 DIAGNOSIS — Z20822 Contact with and (suspected) exposure to covid-19: Secondary | ICD-10-CM | POA: Diagnosis present

## 2021-04-12 DIAGNOSIS — Z923 Personal history of irradiation: Secondary | ICD-10-CM | POA: Diagnosis not present

## 2021-04-12 DIAGNOSIS — D6481 Anemia due to antineoplastic chemotherapy: Secondary | ICD-10-CM | POA: Diagnosis present

## 2021-04-12 DIAGNOSIS — R911 Solitary pulmonary nodule: Secondary | ICD-10-CM | POA: Diagnosis present

## 2021-04-12 DIAGNOSIS — K251 Acute gastric ulcer with perforation: Secondary | ICD-10-CM | POA: Diagnosis not present

## 2021-04-12 DIAGNOSIS — I10 Essential (primary) hypertension: Secondary | ICD-10-CM | POA: Diagnosis present

## 2021-04-12 DIAGNOSIS — H409 Unspecified glaucoma: Secondary | ICD-10-CM | POA: Diagnosis present

## 2021-04-12 DIAGNOSIS — C321 Malignant neoplasm of supraglottis: Secondary | ICD-10-CM | POA: Diagnosis present

## 2021-04-12 DIAGNOSIS — B9681 Helicobacter pylori [H. pylori] as the cause of diseases classified elsewhere: Secondary | ICD-10-CM | POA: Diagnosis present

## 2021-04-12 DIAGNOSIS — K650 Generalized (acute) peritonitis: Secondary | ICD-10-CM | POA: Diagnosis present

## 2021-04-12 DIAGNOSIS — C090 Malignant neoplasm of tonsillar fossa: Secondary | ICD-10-CM

## 2021-04-12 DIAGNOSIS — F17210 Nicotine dependence, cigarettes, uncomplicated: Secondary | ICD-10-CM | POA: Diagnosis present

## 2021-04-12 DIAGNOSIS — K9423 Gastrostomy malfunction: Principal | ICD-10-CM | POA: Diagnosis present

## 2021-04-12 DIAGNOSIS — K255 Chronic or unspecified gastric ulcer with perforation: Secondary | ICD-10-CM

## 2021-04-12 DIAGNOSIS — R1110 Vomiting, unspecified: Secondary | ICD-10-CM

## 2021-04-12 HISTORY — PX: LAPAROTOMY: SHX154

## 2021-04-12 LAB — COMPREHENSIVE METABOLIC PANEL
ALT: 12 U/L (ref 0–44)
AST: 14 U/L — ABNORMAL LOW (ref 15–41)
Albumin: 3.4 g/dL — ABNORMAL LOW (ref 3.5–5.0)
Alkaline Phosphatase: 57 U/L (ref 38–126)
Anion gap: 16 — ABNORMAL HIGH (ref 5–15)
BUN: 16 mg/dL (ref 8–23)
CO2: 26 mmol/L (ref 22–32)
Calcium: 9.1 mg/dL (ref 8.9–10.3)
Chloride: 92 mmol/L — ABNORMAL LOW (ref 98–111)
Creatinine, Ser: 0.48 mg/dL — ABNORMAL LOW (ref 0.61–1.24)
GFR, Estimated: >60 mL/min (ref >60)
Glucose, Bld: 120 mg/dL — ABNORMAL HIGH (ref 70–99)
Potassium: 3.6 mmol/L (ref 3.5–5.1)
Sodium: 134 mmol/L — ABNORMAL LOW (ref 135–145)
Total Bilirubin: 1.5 mg/dL — ABNORMAL HIGH (ref 0.3–1.2)
Total Protein: 6.7 g/dL (ref 6.5–8.1)

## 2021-04-12 LAB — URINALYSIS, ROUTINE W REFLEX MICROSCOPIC
Bilirubin Urine: NEGATIVE
Glucose, UA: NEGATIVE mg/dL
Hgb urine dipstick: NEGATIVE
Ketones, ur: 20 mg/dL — AB
Leukocytes,Ua: NEGATIVE
Nitrite: NEGATIVE
Protein, ur: NEGATIVE mg/dL
Specific Gravity, Urine: 1.004 — ABNORMAL LOW (ref 1.005–1.030)
pH: 6 (ref 5.0–8.0)

## 2021-04-12 LAB — CBC WITH DIFFERENTIAL/PLATELET
Abs Immature Granulocytes: 0.68 10*3/uL — ABNORMAL HIGH (ref 0.00–0.07)
Basophils Absolute: 0 10*3/uL (ref 0.0–0.1)
Basophils Relative: 0 %
Eosinophils Absolute: 0 10*3/uL (ref 0.0–0.5)
Eosinophils Relative: 0 %
HCT: 30.5 % — ABNORMAL LOW (ref 39.0–52.0)
Hemoglobin: 9.9 g/dL — ABNORMAL LOW (ref 13.0–17.0)
Immature Granulocytes: 3 %
Lymphocytes Relative: 2 %
Lymphs Abs: 0.3 10*3/uL — ABNORMAL LOW (ref 0.7–4.0)
MCH: 31.2 pg (ref 26.0–34.0)
MCHC: 32.5 g/dL (ref 30.0–36.0)
MCV: 96.2 fL (ref 80.0–100.0)
Monocytes Absolute: 0.8 10*3/uL (ref 0.1–1.0)
Monocytes Relative: 4 %
Neutro Abs: 19.6 10*3/uL — ABNORMAL HIGH (ref 1.7–7.7)
Neutrophils Relative %: 91 %
Platelets: 381 10*3/uL (ref 150–400)
RBC: 3.17 MIL/uL — ABNORMAL LOW (ref 4.22–5.81)
RDW: 14.9 % (ref 11.5–15.5)
WBC: 21.4 10*3/uL — ABNORMAL HIGH (ref 4.0–10.5)
nRBC: 0 % (ref 0.0–0.2)

## 2021-04-12 LAB — TYPE AND SCREEN
ABO/RH(D): A POS
Antibody Screen: NEGATIVE

## 2021-04-12 LAB — LIPASE, BLOOD: Lipase: 72 U/L — ABNORMAL HIGH (ref 11–51)

## 2021-04-12 LAB — RESP PANEL BY RT-PCR (FLU A&B, COVID) ARPGX2
Influenza A by PCR: NEGATIVE
Influenza B by PCR: NEGATIVE
SARS Coronavirus 2 by RT PCR: NEGATIVE

## 2021-04-12 LAB — ETHANOL: Alcohol, Ethyl (B): <10 mg/dL (ref <10)

## 2021-04-12 SURGERY — LAPAROTOMY, EXPLORATORY
Anesthesia: General | Site: Abdomen

## 2021-04-12 MED ORDER — ONDANSETRON HCL 4 MG/2ML IJ SOLN
INTRAMUSCULAR | Status: DC | PRN
  Administered 2021-04-12: 4 mg via INTRAVENOUS

## 2021-04-12 MED ORDER — PROPOFOL 10 MG/ML IV BOLUS
INTRAVENOUS | Status: AC
  Filled 2021-04-12: qty 20

## 2021-04-12 MED ORDER — IOHEXOL 350 MG/ML SOLN
100.0000 mL | Freq: Once | INTRAVENOUS | Status: AC | PRN
  Administered 2021-04-12: 80 mL via INTRAVENOUS

## 2021-04-12 MED ORDER — 0.9 % SODIUM CHLORIDE (POUR BTL) OPTIME
TOPICAL | Status: DC | PRN
  Administered 2021-04-12: 3000 mL

## 2021-04-12 MED ORDER — PROPOFOL 10 MG/ML IV BOLUS
INTRAVENOUS | Status: DC | PRN
  Administered 2021-04-12: 60 mg via INTRAVENOUS

## 2021-04-12 MED ORDER — LACTATED RINGERS IV SOLN: INTRAVENOUS | Status: DC

## 2021-04-12 MED ORDER — DEXAMETHASONE SODIUM PHOSPHATE 10 MG/ML IJ SOLN
INTRAMUSCULAR | Status: DC | PRN
  Administered 2021-04-12: 5 mg via INTRAVENOUS

## 2021-04-12 MED ORDER — FENTANYL CITRATE (PF) 250 MCG/5ML IJ SOLN
INTRAMUSCULAR | Status: DC | PRN
  Administered 2021-04-12 (×4): 50 ug via INTRAVENOUS

## 2021-04-12 MED ORDER — ROCURONIUM BROMIDE 10 MG/ML (PF) SYRINGE
PREFILLED_SYRINGE | INTRAVENOUS | Status: AC
  Filled 2021-04-12: qty 10

## 2021-04-12 MED ORDER — ACETAMINOPHEN 10 MG/ML IV SOLN
1000.0000 mg | Freq: Four times a day (QID) | INTRAVENOUS | Status: AC
  Administered 2021-04-12 – 2021-04-13 (×3): 1000 mg via INTRAVENOUS
  Filled 2021-04-12 (×3): qty 100

## 2021-04-12 MED ORDER — SUGAMMADEX SODIUM 200 MG/2ML IV SOLN
INTRAVENOUS | Status: DC | PRN
  Administered 2021-04-12: 100 mg via INTRAVENOUS

## 2021-04-12 MED ORDER — CHLORHEXIDINE GLUCONATE 0.12 % MT SOLN
15.0000 mL | Freq: Once | OROMUCOSAL | Status: AC
  Administered 2021-04-12: 15 mL via OROMUCOSAL

## 2021-04-12 MED ORDER — ONDANSETRON HCL 4 MG/2ML IJ SOLN: 4.0000 mg | Freq: Once | INTRAMUSCULAR | Status: DC | PRN

## 2021-04-12 MED ORDER — ACETAMINOPHEN 650 MG RE SUPP: 650.0000 mg | Freq: Four times a day (QID) | RECTAL | Status: DC | PRN

## 2021-04-12 MED ORDER — DEXAMETHASONE SODIUM PHOSPHATE 10 MG/ML IJ SOLN
INTRAMUSCULAR | Status: AC
  Filled 2021-04-12: qty 1

## 2021-04-12 MED ORDER — SODIUM CHLORIDE 0.9 % IV BOLUS
500.0000 mL | Freq: Once | INTRAVENOUS | Status: AC
  Administered 2021-04-12: 500 mL via INTRAVENOUS

## 2021-04-12 MED ORDER — CHLORHEXIDINE GLUCONATE CLOTH 2 % EX PADS
6.0000 | MEDICATED_PAD | Freq: Every day | CUTANEOUS | Status: DC
  Administered 2021-04-13 – 2021-04-22 (×11): 6 via TOPICAL

## 2021-04-12 MED ORDER — SUCCINYLCHOLINE CHLORIDE 200 MG/10ML IV SOSY
PREFILLED_SYRINGE | INTRAVENOUS | Status: AC
  Filled 2021-04-12: qty 10

## 2021-04-12 MED ORDER — IOHEXOL 300 MG/ML  SOLN
30.0000 mL | Freq: Once | INTRAMUSCULAR | Status: AC | PRN
  Administered 2021-04-12: 30 mL

## 2021-04-12 MED ORDER — SODIUM CHLORIDE 0.9% FLUSH
10.0000 mL | INTRAVENOUS | Status: DC | PRN
  Administered 2021-04-15: 10 mL

## 2021-04-12 MED ORDER — LIDOCAINE 2% (20 MG/ML) 5 ML SYRINGE
INTRAMUSCULAR | Status: DC | PRN
  Administered 2021-04-12: 1.5 mg/kg/h via INTRAVENOUS

## 2021-04-12 MED ORDER — ROCURONIUM BROMIDE 10 MG/ML (PF) SYRINGE
PREFILLED_SYRINGE | INTRAVENOUS | Status: DC | PRN
  Administered 2021-04-12: 50 mg via INTRAVENOUS

## 2021-04-12 MED ORDER — LIDOCAINE 2% (20 MG/ML) 5 ML SYRINGE
INTRAMUSCULAR | Status: DC | PRN
  Administered 2021-04-12: 60 mg via INTRAVENOUS

## 2021-04-12 MED ORDER — ATROPINE SULFATE 1 % OP SOLN: 1.0000 [drp] | Freq: Three times a day (TID) | OPHTHALMIC | Status: DC

## 2021-04-12 MED ORDER — HYDROMORPHONE HCL 1 MG/ML IJ SOLN
INTRAMUSCULAR | Status: AC
  Administered 2021-04-12: 0.5 mg via INTRAVENOUS
  Filled 2021-04-12: qty 1

## 2021-04-12 MED ORDER — PHENYLEPHRINE 40 MCG/ML (10ML) SYRINGE FOR IV PUSH (FOR BLOOD PRESSURE SUPPORT)
PREFILLED_SYRINGE | INTRAVENOUS | Status: DC | PRN
  Administered 2021-04-12 (×2): 120 ug via INTRAVENOUS

## 2021-04-12 MED ORDER — ONDANSETRON HCL 4 MG/2ML IJ SOLN
4.0000 mg | Freq: Once | INTRAMUSCULAR | Status: AC
  Administered 2021-04-12: 4 mg via INTRAVENOUS
  Filled 2021-04-12: qty 2

## 2021-04-12 MED ORDER — ACETAMINOPHEN 325 MG PO TABS: 650.0000 mg | ORAL_TABLET | Freq: Four times a day (QID) | ORAL | Status: DC | PRN

## 2021-04-12 MED ORDER — ONDANSETRON HCL 4 MG/2ML IJ SOLN
INTRAMUSCULAR | Status: AC
  Filled 2021-04-12: qty 2

## 2021-04-12 MED ORDER — FENTANYL CITRATE (PF) 100 MCG/2ML IJ SOLN
50.0000 ug | Freq: Once | INTRAMUSCULAR | Status: AC
Start: 2021-04-12 — End: 2021-04-12
  Administered 2021-04-12: 50 ug via INTRAVENOUS
  Filled 2021-04-12: qty 2

## 2021-04-12 MED ORDER — PIPERACILLIN-TAZOBACTAM 3.375 G IVPB 30 MIN
3.3750 g | Freq: Once | INTRAVENOUS | Status: AC
  Administered 2021-04-12: 3.375 g via INTRAVENOUS
  Filled 2021-04-12: qty 50

## 2021-04-12 MED ORDER — FENTANYL CITRATE (PF) 250 MCG/5ML IJ SOLN
INTRAMUSCULAR | Status: AC
  Filled 2021-04-12: qty 5

## 2021-04-12 MED ORDER — ALBUMIN HUMAN 5 % IV SOLN
INTRAVENOUS | Status: AC
  Filled 2021-04-12: qty 250

## 2021-04-12 MED ORDER — SODIUM CHLORIDE 0.9 % IV SOLN: INTRAVENOUS | Status: DC

## 2021-04-12 MED ORDER — PREDNISOLONE ACETATE 1 % OP SUSP
1.0000 [drp] | Freq: Four times a day (QID) | OPHTHALMIC | Status: DC
  Administered 2021-04-12 – 2021-04-22 (×39): 1 [drp] via OPHTHALMIC
  Filled 2021-04-12 (×2): qty 5

## 2021-04-12 MED ORDER — THIAMINE HCL 100 MG/ML IJ SOLN
100.0000 mg | Freq: Every day | INTRAMUSCULAR | Status: DC
  Administered 2021-04-12: 100 mg via INTRAVENOUS
  Filled 2021-04-12 (×2): qty 2

## 2021-04-12 MED ORDER — SUCCINYLCHOLINE CHLORIDE 200 MG/10ML IV SOSY
PREFILLED_SYRINGE | INTRAVENOUS | Status: DC | PRN
  Administered 2021-04-12: 100 mg via INTRAVENOUS

## 2021-04-12 MED ORDER — LIDOCAINE 2% (20 MG/ML) 5 ML SYRINGE
INTRAMUSCULAR | Status: AC
  Filled 2021-04-12: qty 5

## 2021-04-12 MED ORDER — ORAL CARE MOUTH RINSE: 15.0000 mL | Freq: Once | OROMUCOSAL | Status: AC

## 2021-04-12 MED ORDER — MIDAZOLAM HCL 2 MG/2ML IJ SOLN
INTRAMUSCULAR | Status: DC | PRN
  Administered 2021-04-12: 1 mg via INTRAVENOUS

## 2021-04-12 MED ORDER — ACETAMINOPHEN 10 MG/ML IV SOLN
INTRAVENOUS | Status: AC
  Administered 2021-04-12: 1000 mg via INTRAVENOUS
  Filled 2021-04-12: qty 100

## 2021-04-12 MED ORDER — DEXMEDETOMIDINE (PRECEDEX) IN NS 20 MCG/5ML (4 MCG/ML) IV SYRINGE
PREFILLED_SYRINGE | INTRAVENOUS | Status: AC
  Filled 2021-04-12: qty 5

## 2021-04-12 MED ORDER — PIPERACILLIN-TAZOBACTAM 3.375 G IVPB
3.3750 g | Freq: Three times a day (TID) | INTRAVENOUS | Status: DC
  Administered 2021-04-12 – 2021-04-17 (×16): 3.375 g via INTRAVENOUS
  Filled 2021-04-12 (×15): qty 50

## 2021-04-12 MED ORDER — ACETAMINOPHEN 10 MG/ML IV SOLN: 1000.0000 mg | Freq: Once | INTRAVENOUS | Status: DC | PRN

## 2021-04-12 MED ORDER — HYDROMORPHONE HCL 1 MG/ML IJ SOLN: 0.2500 mg | INTRAMUSCULAR | Status: DC | PRN

## 2021-04-12 MED ORDER — DEXMEDETOMIDINE (PRECEDEX) IN NS 20 MCG/5ML (4 MCG/ML) IV SYRINGE
PREFILLED_SYRINGE | INTRAVENOUS | Status: DC | PRN
  Administered 2021-04-12: 12 ug via INTRAVENOUS

## 2021-04-12 MED ORDER — FENTANYL CITRATE (PF) 100 MCG/2ML IJ SOLN
50.0000 ug | INTRAMUSCULAR | Status: DC | PRN
  Administered 2021-04-12: 50 ug via INTRAVENOUS
  Filled 2021-04-12: qty 2

## 2021-04-12 MED ORDER — ALBUMIN HUMAN 5 % IV SOLN: INTRAVENOUS | Status: DC | PRN

## 2021-04-12 MED ORDER — PANTOPRAZOLE SODIUM 40 MG IV SOLR
40.0000 mg | Freq: Two times a day (BID) | INTRAVENOUS | Status: DC
  Administered 2021-04-12 – 2021-04-22 (×20): 40 mg via INTRAVENOUS
  Filled 2021-04-12 (×20): qty 40

## 2021-04-12 MED ORDER — MORPHINE SULFATE (PF) 2 MG/ML IV SOLN
2.0000 mg | INTRAVENOUS | Status: DC | PRN
  Administered 2021-04-13 – 2021-04-17 (×25): 2 mg via INTRAVENOUS
  Filled 2021-04-12 (×27): qty 1

## 2021-04-12 MED ORDER — METHOCARBAMOL 500 MG IVPB - SIMPLE MED
500.0000 mg | Freq: Four times a day (QID) | INTRAVENOUS | Status: DC | PRN
  Filled 2021-04-12: qty 50

## 2021-04-12 MED ORDER — ONDANSETRON HCL 4 MG/2ML IJ SOLN
4.0000 mg | Freq: Four times a day (QID) | INTRAMUSCULAR | Status: DC | PRN
  Administered 2021-04-13 – 2021-04-18 (×4): 4 mg via INTRAVENOUS
  Filled 2021-04-12 (×4): qty 2

## 2021-04-12 MED ORDER — PANTOPRAZOLE SODIUM 40 MG IV SOLR
40.0000 mg | Freq: Once | INTRAVENOUS | Status: AC
  Administered 2021-04-12: 40 mg via INTRAVENOUS
  Filled 2021-04-12: qty 40

## 2021-04-12 MED ORDER — KETAMINE HCL 10 MG/ML IJ SOLN
INTRAMUSCULAR | Status: AC
  Filled 2021-04-12: qty 1

## 2021-04-12 MED ORDER — MIDAZOLAM HCL 2 MG/2ML IJ SOLN
INTRAMUSCULAR | Status: AC
  Filled 2021-04-12: qty 2

## 2021-04-12 MED ORDER — ATROPINE SULFATE 1 % OP SOLN
1.0000 [drp] | Freq: Two times a day (BID) | OPHTHALMIC | Status: DC
  Administered 2021-04-12 – 2021-04-22 (×20): 1 [drp] via OPHTHALMIC
  Filled 2021-04-12 (×2): qty 2

## 2021-04-12 SURGICAL SUPPLY — 37 items
BAG COUNTER SPONGE SURGICOUNT (BAG) IMPLANT
CATH GJ 22FR KT ENFIT (CATHETERS) IMPLANT
CHLORAPREP W/TINT 26 (MISCELLANEOUS) ×4 IMPLANT
COVER MAYO STAND STRL (DRAPES) ×2 IMPLANT
COVER SURGICAL LIGHT HANDLE (MISCELLANEOUS) ×2 IMPLANT
DRAIN CHANNEL 19F RND (DRAIN) ×2 IMPLANT
DRAPE LAPAROSCOPIC ABDOMINAL (DRAPES) ×2 IMPLANT
DRSG OPSITE POSTOP 4X10 (GAUZE/BANDAGES/DRESSINGS) IMPLANT
DRSG OPSITE POSTOP 4X6 (GAUZE/BANDAGES/DRESSINGS) IMPLANT
DRSG OPSITE POSTOP 4X8 (GAUZE/BANDAGES/DRESSINGS) ×2 IMPLANT
ELECT REM PT RETURN 15FT ADLT (MISCELLANEOUS) IMPLANT
EVACUATOR SILICONE 100CC (DRAIN) ×2 IMPLANT
GLOVE SURG ENC MOIS LTX SZ6 (GLOVE) ×4 IMPLANT
GLOVE SURG UNDER LTX SZ6.5 (GLOVE) ×4 IMPLANT
GOWN STRL REUS W/TWL LRG LVL3 (GOWN DISPOSABLE) ×2 IMPLANT
GOWN STRL REUS W/TWL XL LVL3 (GOWN DISPOSABLE) ×2 IMPLANT
KIT TURNOVER KIT A (KITS) ×2 IMPLANT
PACK GENERAL/GYN (CUSTOM PROCEDURE TRAY) ×2 IMPLANT
PENCIL SMOKE EVACUATOR (MISCELLANEOUS) ×2 IMPLANT
SPONGE DRAIN TRACH 4X4 STRL 2S (GAUZE/BANDAGES/DRESSINGS) ×4 IMPLANT
SPONGE T-LAP 18X18 ~~LOC~~+RFID (SPONGE) ×4 IMPLANT
STAPLER VISISTAT 35W (STAPLE) IMPLANT
SUT ETHILON 3 0 PS 1 (SUTURE) IMPLANT
SUT MNCRL AB 4-0 PS2 18 (SUTURE) IMPLANT
SUT NOVA T20/GS 25 (SUTURE) IMPLANT
SUT SILK 2 0 (SUTURE) ×2
SUT SILK 2 0 SH CR/8 (SUTURE) ×2 IMPLANT
SUT SILK 2-0 18XBRD TIE 12 (SUTURE) ×1 IMPLANT
SUT SILK 3 0 (SUTURE) ×2
SUT SILK 3 0 SH CR/8 (SUTURE) ×2 IMPLANT
SUT SILK 3-0 18XBRD TIE 12 (SUTURE) ×1 IMPLANT
SUT VIC AB 2-0 SH 18 (SUTURE) ×4 IMPLANT
TOWEL OR 17X26 10 PK STRL BLUE (TOWEL DISPOSABLE) IMPLANT
TOWEL OR NON WOVEN STRL DISP B (DISPOSABLE) ×2 IMPLANT
TRAY FOLEY MTR SLVR 14FR STAT (SET/KITS/TRAYS/PACK) IMPLANT
TRAY FOLEY MTR SLVR 16FR STAT (SET/KITS/TRAYS/PACK) ×2 IMPLANT
TUBE BOLUS G TUBE 26FR (CATHETERS) ×2 IMPLANT

## 2021-04-12 NOTE — ED Provider Notes (Signed)
[facility] DEPT Provider Note   CSN: [id number] Arrival date & time: 04/12/21  0036     History Chief Complaint  Patient presents with   Nausea   Blood in Feeding Tube    [name] is a 70 y.o. male.  The history is provided by the patient and medical records.   70 year old male with history of throat cancer, alcohol abuse, hepatitis C, hypertension, presenting to the ED with nausea, vomiting, and some bleeding around his feeding tube.  States he started vomiting yesterday, seems a little worse today.  He reports bleeding from around the site of his PEG tube but has not had any bleeding into the tubing.  He states it has been flushing fine.  He still eats and drinks fairly regularly, not really able to hold much down today.  He denies any fever.  No sick contacts.  Past Medical History:  Diagnosis Date   Cancer ([place])    Throat cancer 2019   ETOH abuse    Frequent urination    Glaucoma    Hepatitis C virus infection without hepatic coma    dx'ed in 11/2018   History of radiation therapy 05/12/19- 07/06/19   Larynx   Hypertension    Wears denture    upper only; lost lower denture    Patient Active Problem List   Diagnosis Date Noted   Anemia due to antineoplastic chemotherapy 03/26/2021   Cancer related pain 03/26/2021   Laryngeal edema 03/11/2021   Nausea and vomiting 01/24/2021   Shortness of breath 01/10/2021   Mucositis due to antineoplastic therapy 01/10/2021   G tube feedings ([place]) 01/10/2021   Chemotherapy-induced diarrhea 10/30/2020   Port-A-Cath in place 10/23/2020   Cancer of tonsillar fossa ([place]) 08/31/2020   Laryngeal cancer ([place]) 01/06/2020   Metastatic cancer to cervical lymph nodes ([place]) 12/01/2019   Chronic hepatitis C without hepatic coma ([place]) 06/09/2019   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[place]) 05/02/2019   Squamous cell carcinoma of neck 04/01/2019    Past Surgical History:  Procedure  Laterality Date   ANKLE SURGERY  2011   right ankle   COLONOSCOPY  02/2019   polyps - Dr Havery Moros   DIRECT LARYNGOSCOPY N/A 04/01/2019   Procedure: DIRECT LARYNGOSCOPY WITH BIOPSY;  Surgeon: Jodi Marble, MD;  Location: [facility];  Service: ENT;  Laterality: N/A;   DIRECT LARYNGOSCOPY N/A 01/06/2020   Procedure: DIRECT LARYNGOSCOPY;  Surgeon: Izora Gala, MD;  Location: [facility];  Service: ENT;  Laterality: N/A;   DIRECT LARYNGOSCOPY N/A 08/13/2020   Procedure: DIRECT LARYNGOSCOPY;  Surgeon: Izora Gala, MD;  Location: [facility];  Service: ENT;  Laterality: N/A;   ESOPHAGOSCOPY N/A 04/01/2019   Procedure: ESOPHAGOSCOPY;  Surgeon: Jodi Marble, MD;  Location: [facility];  Service: ENT;  Laterality: N/A;   EXCISION ORAL TUMOR Right 08/13/2020   Procedure: BIOPSY OF OROPHARYNGEAL MASS;  Surgeon: Izora Gala, MD;  Location: [facility];  Service: ENT;  Laterality: Right;   EYE SURGERY Right    IR CM INJ ANY COLONIC TUBE W/FLUORO  12/20/2020   IR CM INJ ANY COLONIC TUBE W/FLUORO  03/20/2021   IR GASTROSTOMY TUBE MOD SED  09/24/2020   IR IMAGING GUIDED PORT INSERTION  09/24/2020   KNEE SURGERY     LARYNGOSCOPY AND BRONCHOSCOPY N/A 04/01/2019   Procedure: BRONCHOSCOPY;  Surgeon: Jodi Marble, MD;  Location: [facility];  Service: ENT;  Laterality: N/A;   RADICAL NECK DISSECTION  N/A 01/06/2020   Procedure: RADICAL NECK DISSECTION;  Surgeon: Izora Gala, MD;  Location: [facility];  Service: ENT;  Laterality: N/A;       Family History  Problem Relation Age of Onset   Breast cancer Sister    Colon cancer Brother 55       ????   Cancer Brother     Social History   Tobacco Use   Smoking status: Former    Years: 50.00    Types: Cigarettes    Quit date: 06/01/2020    Years since quitting: 0.8   Smokeless tobacco: Never  Vaping Use   Vaping Use: Never used  Substance Use Topics   Alcohol use: Yes    Alcohol/week: 4.0 standard drinks    Types: 4 Cans of beer per week     Comment: 40oz beer daily   Drug use: Yes    Types: Cocaine    Comment: none in 2 yrs    Home Medications Prior to Admission medications   Medication Sig Start Date End Date Taking? Authorizing Provider  atropine 1 % ophthalmic solution Place 1 drop into the right eye 2 (two) times a day.     [provider]  dexamethasone (DECADRON) 2 MG tablet Take 1 tablet (2 mg total) by mouth daily. 03/26/21   [name], MD  dexamethasone (DECADRON) 4 MG tablet Take 2 tablets (8 mg total) by mouth 2 (two) times daily. Start the day before Taxotere. Then daily after chemo for 2 days. 03/05/21   [name], MD  lidocaine (XYLOCAINE) 2 % solution Use as directed 10 mLs in the mouth or throat every 6 (six) hours as needed for mouth pain. Mix 1 part of lidocaine with 1 part of water, swish and swallow 03/28/21   [name], MD  lidocaine-prilocaine (EMLA) cream Apply to affected area once Patient taking differently: Apply 1 application topically daily as needed (access port). 03/05/21   [name], MD  losartan (COZAAR) 50 MG tablet Take 50 mg by mouth daily. 02/24/20   [provider]  Nutritional Supplements (KATE FARMS STANDARD 1.4) LIQD 325 mLs by Enteral route 4 (four) times daily. Give 1 carton (325 ml) [employer] 1.4 via tube four times daily. Flush tube with 90 ml free water before and after each bolus feed. Give additional 240 ml (1 cup) water via tube daily. Provides 1820 kcal, 80 grams protein, 1896 ml total water. Meets 100% needs 04/09/21   [name], MD  ondansetron (ZOFRAN) 8 MG tablet Take 1 tablet (8 mg total) by mouth 2 (two) times daily as needed for refractory nausea / vomiting. 03/05/21   [name], MD  oxyCODONE (OXY IR/ROXICODONE) 5 MG immediate release tablet Take 1 tablet (5 mg total) by mouth every 6 (six) hours as needed for moderate pain. 03/26/21   [name], MD  prednisoLONE acetate (PRED FORTE) 1 % ophthalmic suspension Place 1 drop  into the right eye 4 (four) times daily.    [provider]  prochlorperazine (COMPAZINE) 10 MG tablet Take 1 tablet (10 mg total) by mouth every 6 (six) hours as needed (Nausea or vomiting). 03/05/21   [name], MD  tolterodine (DETROL LA) 4 MG 24 hr capsule Take 4 mg by mouth daily. 01/16/19   [provider]  Vitamin D, Ergocalciferol, (DRISDOL) 1.25 MG (50000 UNIT) CAPS capsule Take 50,000 Units by mouth once a week. 05/22/20   [provider]    Allergies    Patient  has no known allergies.  Review of Systems   Review of Systems  Gastrointestinal:  Positive for nausea and vomiting.  All other systems reviewed and are negative.  Physical Exam Updated Vital Signs BP 114/77 (BP Location: Right Arm)   Pulse (!) 108   Temp (!) 97.4 F (36.3 C) (Oral)   Resp 14   Ht '5\' 8"'$  (1.727 m)   Wt 50.6 kg   SpO2 98%   BMI 16.96 kg/m   Physical Exam Vitals and nursing note reviewed.  Constitutional:      Appearance: He is well-developed.     Comments: Very thin, chronically ill appearing  HENT:     Head: Normocephalic and atraumatic.  Eyes:     Conjunctiva/sclera: Conjunctivae normal.     Pupils: Pupils are equal, round, and reactive to light.  Cardiovascular:     Rate and Rhythm: Normal rate and regular rhythm.     Heart sounds: Normal heart sounds.  Pulmonary:     Effort: Pulmonary effort is normal.     Breath sounds: Normal breath sounds.  Abdominal:     General: Bowel sounds are normal.     Palpations: Abdomen is soft.     Comments: Peg tube in place, there is no bleeding inside the tubing, does appear to have some macerated tissue beneath insertion site with scant bleeding, there is no protective dressing/skin barrier present  Musculoskeletal:        General: Normal range of motion.     Cervical back: Normal range of motion.  Skin:    General: Skin is warm and dry.  Neurological:     Mental Status: He is alert and oriented to person, place, and  time.    ED Results / Procedures / Treatments   Labs (all labs ordered are listed, but only abnormal results are displayed) Labs Reviewed  CBC WITH DIFFERENTIAL/PLATELET - Abnormal; Notable for the following components:      Result Value   WBC 21.4 (*)    RBC 3.17 (*)    Hemoglobin 9.9 (*)    HCT 30.5 (*)    Neutro Abs 19.6 (*)    Lymphs Abs 0.3 (*)    Abs Immature Granulocytes 0.68 (*)    All other components within normal limits  COMPREHENSIVE METABOLIC PANEL - Abnormal; Notable for the following components:   Sodium 134 (*)    Chloride 92 (*)    Glucose, Bld 120 (*)    Creatinine, Ser 0.48 (*)    Albumin 3.4 (*)    AST 14 (*)    Total Bilirubin 1.5 (*)    Anion gap 16 (*)    All other components within normal limits  LIPASE, BLOOD - Abnormal; Notable for the following components:   Lipase 72 (*)    All other components within normal limits  URINALYSIS, ROUTINE W REFLEX MICROSCOPIC - Abnormal; Notable for the following components:   Color, Urine STRAW (*)    Specific Gravity, Urine 1.004 (*)    Ketones, ur 20 (*)    All other components within normal limits  RESP PANEL BY RT-PCR (FLU A&B, COVID) [id number]  ETHANOL    EKG None  Radiology DG Chest 1 View  Result Date: 04/12/2021 CLINICAL DATA:  History of throat cancer with abdominal pain. EXAM: CHEST  1 VIEW COMPARISON:  January 10, 2021 FINDINGS: There is stable left-sided venous Port-A-Cath positioning. The heart size and mediastinal contours are within normal limits. Both lungs are clear. Radiopaque contrast is  seen within the gastric lumen. The visualized skeletal structures are unremarkable. IMPRESSION: No active disease. Electronically Signed   By: Virgina Norfolk M.D.   On: 04/12/2021 03:35   CT ABDOMEN PELVIS W CONTRAST  Result Date: 04/12/2021 CLINICAL DATA:  Nausea, vomiting, blood in gastrostomy catheter EXAM: CT ABDOMEN AND PELVIS WITH CONTRAST TECHNIQUE: Multidetector CT imaging of the abdomen and pelvis  was performed using the standard protocol following bolus administration of intravenous contrast. CONTRAST:  45m OMNIPAQUE IOHEXOL 350 MG/ML SOLN COMPARISON:  PET-CT 01/31/2021, and previous FINDINGS: Lower chest: No pleural or pericardial effusion. 1.3 cm pleural-based nodule in the right middle lobe, new since previous. Hepatobiliary: No focal liver abnormality is seen. No gallstones, gallbladder wall thickening, or biliary dilatation. Pancreas: Unremarkable. No pancreatic ductal dilatation or surrounding inflammatory changes. Spleen: Normal in size without focal abnormality. Adrenals/Urinary Tract: Adrenal glands unremarkable. 1 cm probable cyst, mid left kidney. No hydronephrosis. Contrast material in the renal collecting systems and nondistended urinary bladder. Stomach/Bowel: The gastrostomy tube projects in expected location. There is dense contrast in the partially distended stomach. Small bowel is decompressed, with incomplete distal passage of oral contrast material. The colon is decompressed, unremarkable. Vascular/Lymphatic: Scattered calcified aortoiliac plaque without aneurysm or evident stenosis. Portal vein patent. No abdominal or pelvic adenopathy. Reproductive: Prostate is unremarkable. Other: There is a moderate amount of high attenuation fluid in the upper and anterior abdomen suggesting leak of oral contrast material, presumably from along the gastrostomy tract although there is no definite hyperdense leak adjacent to the gastrostomy catheter. Small volume fluid-attenuation pelvic ascites without evident loculation. No free air. Musculoskeletal: Mild L3 compression deformity, stable since 11/10/2019. Multilevel lumbar spondylitic change. No acute fracture or worrisome bone lesion. IMPRESSION: 1. Contrast material in the peritoneal cavity of the upper abdomen suggesting leak along the gastrostomy tract. Critical Value/emergent results were called by telephone at the time of interpretation on  04/12/2021 at 5:38 am to provider [facility]. [facility], who verbally acknowledged these results. 2. Gastrostomy tube is appropriately positioned. No evidence of abscess. 3. 1.3 cm right middle lobe nodule, new since 01/31/2021. Favor infectious/inflammatory over primary/secondary neoplasm given the relatively short time frame of development. Electronically Signed   By: DLucrezia EuropeM.D.   On: 04/12/2021 05:41   DG ABDOMEN PEG TUBE LOCATION  Result Date: 04/12/2021 CLINICAL DATA:  Peg tube location. EXAM: ABDOMEN - 1 VIEW COMPARISON:  None. FINDINGS: A percutaneous gastrostomy tube is seen with its distal tip and insufflator bulb overlying left upper quadrant. Following injection of 30 mL of Omnipaque into the peg tube, radiopaque contrast is seen throughout the gastric lumen. No contrast extravasation is identified. The bowel gas pattern is normal. No radio-opaque calculi are seen. IMPRESSION: Percutaneous gastrostomy tube positioned within the body of the stomach. Electronically Signed   By: TVirgina NorfolkM.D.   On: 04/12/2021 02:38    Procedures Procedures   Medications Ordered in ED Medications  ondansetron (ZOFRAN) injection 4 mg (4 mg Intravenous Given 04/12/21 0211)  sodium chloride 0.9 % bolus 500 mL (0 mLs Intravenous Stopped 04/12/21 0247)  iohexol (OMNIPAQUE) 300 MG/ML solution 30 mL (30 mLs Per Tube Contrast Given 04/12/21 0227)  iohexol (OMNIPAQUE) 350 MG/ML injection 100 mL (80 mLs Intravenous Contrast Given 04/12/21 0431)  fentaNYL (SUBLIMAZE) injection 50 mcg (50 mcg Intravenous Given 04/12/21 0457)  piperacillin-tazobactam (ZOSYN) IVPB 3.375 g (0 g Intravenous Stopped 04/12/21 [id number]    ED Course  I have reviewed the triage vital signs and the nursing  notes.  Pertinent labs & imaging results that were available during my care of the patient were reviewed by me and considered in my medical decision making (see chart for details).    MDM Rules/Calculators/A&P                            70 year old male presenting to the ED with vomiting and blood around his feeding tube.  States he has been feeling nauseated for about 24 hours.  He is afebrile and nontoxic in appearance here.  He is somewhat tachycardic but blood pressure is stable.  Feeding tube is in position, there does appear to be macerated tissue beneath this and there is no barrier gauze present.  I do not appreciate any blood in the actual tubing and none in his emesis.  It appears that there was skin breakdown noted during IR evaluation on 03/20/2021 as well.  Will obtain screening labs.  Will get x-ray to confirm PEG location.  Given IV fluid bolus, Zofran.  Patient with significant leukocytosis of 21,000.  He is currently on Decadron and Neulasta with his chemo regimen, last given 03/27/2021.  Last WBC count prior to this was around 12K and prior to that were normal or on the low side.  He remains without fever, no further vomiting after zofran here.  PEG study with appropriate positioning of tubing.  Given his significant leukocytosis, x-ray was obtained to ensure no aspiration as he does still eat and drink, this is normal.  Will obtain CT.  CT with contrast present in the abdomen from prior PEG study, however also has contrast pooled around spleen and liver.  This appears less dense and is suspicious for leak.  There is no discrete abscess, free air, or fistula tract noted.  On last IR eval, it did appear there was some external leaking but this was felt to be related to external disc not cinched against his skin.  This has been firmly pressed against his abdomen, I suspect this is likely why he is started to have more skin breakdown.  Unsure of exact reasoning for leak at this time.  Given dose of IV Zosyn.    Discussed with Dr. Myna Hidalgo-- he will admit.  Have paged on call IR to see if this is something they can assist with or if it needs general surgery.  6:24 AM Spoke to on call IR, Dr. Kathlene Cote-- will have IR team here  evaluate for any cause they can see (cracked tube, breakdown, poor positioning, etc).  If nothing identifiable or beyond their scope, will relay to medical team for surgical consult.    Final Clinical Impression(s) / ED Diagnoses Final diagnoses:  Vomiting  Leaking percutaneous endoscopic gastrostomy (PEG) tube [facility])    Rx / DC Orders ED Discharge Orders     None        Larene Pickett, PA-C 04/12/21 0630    Fatima Blank, MD 04/12/21 [id number]

## 2021-04-12 NOTE — Progress Notes (Addendum)
PHARMACY - TOTAL PARENTERAL NUTRITION CONSULT NOTE   Indication: Duodenal perf s/p repair 7/29  Patient Measurements: Height: '5\' 8"'$  (172.7 cm) Weight: 50.6 kg (111 lb 8.8 oz) IBW/kg (Calculated) : 68.4 TPN AdjBW (KG): 50.6 Body mass index is 16.96 kg/m. Usual Weight:   Assessment:  70 y.o. male w/ laryngeal cancer w/ hx of radiation and currently undergoing chemo. PMH:  hep C, HTN, ETOH abuse; glaucoma.  PTA has PEG but still eats & drinks- had been putting beer & moonshine into PEG tube per RD note 7/26  7/29 presented with bleeding around g-tube. Patient has hx of g-tube insertion by IR in Jan 2022 by IR.  CT suggested leak long gastrostomy tract. 7/29 emergent exp lap: perforated duodenal ulcer, modified Graham patch of duodenal ulcer, Gastrostomy tube exchange  Glucose / Insulin: no hx DM, got decadron 5 mg IV x 1 7/29; on decadron 2 mg po qday PTA Electrolytes:  Lytes WNL x Na 134;  Renal: WNL Hepatic:  T bili 1.5 Intake / Output; MIVF:  GI Imaging:  7/29 CT A/P: suggesting leak along the gastrostomy tract.  GI Surgeries / Procedures:  7/29 emergent exp lap: perforated duodenal ulcer, modified Graham patch of duodenal ulcer, Gastrostomy tube exchange Central access: has PAC placed 1/10/202; PICC ordered 7/29 for TPN TPN start date: 04/13/2021  Nutritional Goals (per RD recommendation on 04/10/2019): PTA: [place] Std 1.4  325 mls QID & flush w/ 90 ml free water before and after each feed to provide 1820 Kcal & 80 gm protein, 1896 total water;   kCal: [id number], Protein: 86-100, Fluid: 1.8 L  Goal TPN rate is 75 mL/hr (provides  g of protein and  kcals per day)  Current Nutrition:  NPO  Plan:  TPN consult received after noon deadline. TPN to stat 04/13/2021 at 1800 TPN labs ordered IVF per MD  Eudelia Bunch, Pharm.D 04/12/2021 1:53 PM

## 2021-04-12 NOTE — Anesthesia Postprocedure Evaluation (Signed)
Anesthesia Post Note  [name]  Procedure(s) Performed: EXPLORATORY LAPAROTOMY (Abdomen)     Patient location during evaluation: PACU Anesthesia Type: General Level of consciousness: awake and alert Pain management: pain level controlled Vital Signs Assessment: post-procedure vital signs reviewed and stable Respiratory status: spontaneous breathing, nonlabored ventilation, respiratory function stable and patient connected to nasal cannula oxygen Cardiovascular status: blood pressure returned to baseline and stable Postop Assessment: no apparent nausea or vomiting Anesthetic complications: no   No notable events documented.  Last Vitals:  Vitals:   04/12/21 1630 04/12/21 1645  BP: 113/81 123/85  Pulse: 85 86  Resp: 11 11  Temp:    SpO2: 97% 96%    Last Pain:  Vitals:   04/12/21 1645  TempSrc:   PainSc: Asleep                 Barnet Glasgow

## 2021-04-12 NOTE — ED Notes (Signed)
Patient asking for pain medication. Lattie Haw, [place] notified.

## 2021-04-12 NOTE — Op Note (Signed)
[name] (10/18/1950, [id number])  Date of Surgery: 04/12/2021   Preoperative Diagnosis: Perforated viscus  Postoperative Diagnosis: Perforated duodenal ulcer (1st portion)  Surgical Procedure:  Modified Graham patch of duodenal ulcer Gastrostomy tube exchange  Operative Team Members:  Surgeon(s) and Role:    * Aries Kasa, Nickola Major, MD - Primary   Anesthesiologist: Barnet Glasgow, MD; Myrtie Soman, MD CRNA: Sharlette Dense, CRNA; West Pugh, CRNA   Anesthesia: General   Fluids:  Total I/O In: 500 [IV Piggyback:500] Out: 175 [Urine:125; [id number]  Complications: * No complications entered in OR log *  Drains:  (19 Fr) Jackson-Pratt drain(s) with closed bulb suction in the right upper quadrant near the duodenal ulcer repair and Gastrostomy Tube 26 fr  Specimen: None  Disposition:  PACU - hemodynamically stable.  Plan of Care: Admit to inpatient     Indications for Procedure: Iden Massett Scheider is a 70 y.o. male who presented with abdominal pain, found to have a perforated viscus with enteral contrast filling the abdomen on CT scan of the abdomen.  Recommendation was made to proceed with exploratory laparotomy.  The procedure itself as well as its risks, benefits and alternatives were discussed.  The risks discussed included but were not limited to the risk of infection, bleeding, damage to nearby structures.  After a full discussion and all questions answered the patient granted consent to proceed.  Findings: Perforated duodenal ulcer  Infection status: [name] Emergency General Surgery Service Patient Case: Emergent Infection Present At Time Of Surgery (PATOS): Purulent Peritonitis from freely perforated duodenal ulcer   Description of Procedure:   On the date stated above patient taken operating suite and placed supine position.  General endotracheal anesthesia was induced.  A timeout was completed verifying the correct patient,  procedure, position, and equipment needed for the case.  The patient's abdomen was prepped and draped in usual sterile fashion.  Antibiotics were given prior to the case start.  SCDs were placed on the lower extremities.  Made an upper midline laparotomy incision under the abdomen.  There was purulent fluid throughout the abdomen with a tinge of bile appearance.  The G-tube was inspected and appeared healthy and intact without any issues.  We explored the abdomen.  A perforated ulcer was identified in the anterior aspect of the first portion of the duodenum near the pylorus.  It was densely adherent to the gallbladder.  Using blunt dissection and electrocautery the duodenum was dissected away from the gallbladder.  This may have been the largest of the ulcer slightly, but give [place] the ability to fully inspect the ulcer for repair.  There is inflammation on the wall the gallbladder but the gallbladder appeared intact.  We performed a modified Graham patch of this perforated ulcer.  The ulcer defect was closed using 4 interrupted Vicryl sutures, being careful to reapproximate the tissues but not tear through the inflamed duodenum.  The falciform ligament was dissected off the anterior abdominal wall and dropped down to cover the ulcer.  Two 2-0 Vicryl sutures were placed near the ulcer on the duodenum and stomach to hold the tongue of harvested falciform ligament over the ulcer repair.  With also repaired, we attempted to replace the gastrostomy tube with a GJ tube so that we could feed the patient passed the ulcer.  I was able to advance the GJ tube into the bulb of the duodenum, however I was unable to advance it through the duodenal sweep into  the distal duodenum or jejunum.  I did not want to injure the rib also repair so this idea was aborted and we replaced the gastrostomy tube with a simple 26 French gastrostomy tube which could be replaced with a GJ tube in the future endoscopically or fluoroscopically.  I  did not want to perform it jejunostomy tube on this patient as I am concerned he would not heal this well and would have complications related to the J-tube including wound infections, enterocutaneous fistula, or other issues.  We washed the abdomen out with copious saline irrigation and then direct our attention to closure.  A 19 French round channel JP drain was brought through the right abdomen to lay in the right upper quadrant over the duodenal repair.  The fascia was closed with running 2-0 PDS suture.  The skin was closed after irrigating the wound with staples.  All sponge and needle counts were correct in this case.  A sterile dressing was applied and the patient was transferred postanesthesia care unit in stable condition.   Louanna Raw, MD General, Bariatric, & Minimally Invasive Surgery [facility], [place]

## 2021-04-12 NOTE — Progress Notes (Signed)
Attempted to reach out to patients sister, [name], per patients request without answer.

## 2021-04-12 NOTE — ED Notes (Signed)
Called pharmacy regarding pt medications since no family available to take home. Pharmacy recommended medications stay with pt and that pharmacy form be filled out when he reaches the floor. Medications delivered to short stay. Empty hydrocodone, viscous lidocaine, vitamin D2, prochlorperazine, and ondansetron bottles in bag.

## 2021-04-12 NOTE — Transfer of Care (Signed)
Immediate Anesthesia Transfer of Care Note  [name]  Procedure(s) Performed: EXPLORATORY LAPAROTOMY (Abdomen)  Patient Location: PACU  Anesthesia Type:General  Level of Consciousness: awake and alert   Airway & Oxygen Therapy: Patient Spontanous Breathing and Patient connected to face mask oxygen  Post-op Assessment: Report given to RN and Post -op Vital signs reviewed and stable  Post vital signs: Reviewed and stable  Last Vitals:  Vitals Value Taken Time  BP    Temp    Pulse 86 04/12/21 1336  Resp 12 04/12/21 1336  SpO2 100 % 04/12/21 1336  Vitals shown include unvalidated device data.  Last Pain:  Vitals:   04/12/21 1100  TempSrc: Oral  PainSc:          Complications: No notable events documented.

## 2021-04-12 NOTE — ED Triage Notes (Signed)
Patient BIB [facility] from home. Patient has throat cancer and presents with blood in his feeding tube. No external bleeding. Patient vomited en route and is nauseous.   EMS vitals 134/72 HR 64

## 2021-04-12 NOTE — Progress Notes (Signed)
Pharmacy Antibiotic Note  [name] is a 70 y.o. male admitted on 04/12/2021 with bleeding around PEG tube site.  Pharmacy has been consulted for zosyn dosing.  Plan: Zosyn 3.375g IV q8h (4 hour infusion). Pharmacy to sign off  Height: '5\' 8"'$  (172.7 cm) Weight: 50.6 kg (111 lb 8.8 oz) IBW/kg (Calculated) : 68.4  Temp (24hrs), Avg:97.4 F (36.3 C), Min:97.4 F (36.3 C), Max:97.4 F (36.3 C)  Recent Labs  Lab 04/12/21 0147  WBC 21.4*  CREATININE 0.48*    Estimated Creatinine Clearance: 64.1 mL/min (A) (by C-G formula based on SCr of 0.48 mg/dL (L)).    No Known Allergies  Thank you for allowing pharmacy to be a part of this patient's care.  Eudelia Bunch, Pharm.D 04/12/2021 9:25 AM

## 2021-04-12 NOTE — H&P (Signed)
History and Physical    [name] [id number] DOB: July 11, 1951 DOA: 04/12/2021  PCP: [name], MD  Patient coming from: Home.  Chief Complaint: Abdominal pain with blood around the gastrostomy tube.  HPI: [name] is a 70 y.o. 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  Has been having some vomiting.  Denies any diarrhea.  Pain is diffusely present and more around the gastrostomy tube.  ED Course: In the ER patient had a CT abdomen pelvis which shows extravasation of contrast material into the peritoneal cavity concerning for a longer gastrostomy tube.  Patient's labs show WBC count of 21,000 hemoglobin is 9.9 lipase is mildly elevated at 72 COVID test is negative.  Patient was started on Zosyn.  Admitted for further management.  Review of Systems: As per HPI, rest all negative.   Past Medical History:  Diagnosis Date   Cancer ([place])    Throat cancer 2019   ETOH abuse    Frequent urination    Glaucoma    Hepatitis C virus infection without hepatic coma    dx'ed in 11/2018   History of radiation therapy 05/12/19- 07/06/19   Larynx   Hypertension    Wears denture    upper only; lost lower denture    Past Surgical History:  Procedure Laterality Date   ANKLE SURGERY  2011   right ankle   COLONOSCOPY  02/2019   polyps - Dr Havery Moros   DIRECT LARYNGOSCOPY N/A 04/01/2019   Procedure: DIRECT LARYNGOSCOPY WITH BIOPSY;  Surgeon: Jodi Marble, MD;  Location: [place];  Service: ENT;  Laterality: N/A;   DIRECT LARYNGOSCOPY N/A 01/06/2020   Procedure: DIRECT LARYNGOSCOPY;  Surgeon: Izora Gala, MD;  Location: [place];  Service: ENT;  Laterality: N/A;   DIRECT LARYNGOSCOPY N/A  08/13/2020   Procedure: DIRECT LARYNGOSCOPY;  Surgeon: Izora Gala, MD;  Location: [place];  Service: ENT;  Laterality: N/A;   ESOPHAGOSCOPY N/A 04/01/2019   Procedure: ESOPHAGOSCOPY;  Surgeon: Jodi Marble, MD;  Location: [place];  Service: ENT;  Laterality: N/A;   EXCISION ORAL TUMOR Right 08/13/2020   Procedure: BIOPSY OF OROPHARYNGEAL MASS;  Surgeon: Izora Gala, MD;  Location: [facility];  Service: ENT;  Laterality: Right;   EYE SURGERY Right    IR CM INJ ANY COLONIC TUBE W/FLUORO  12/20/2020   IR CM INJ ANY COLONIC TUBE W/FLUORO  03/20/2021   IR GASTROSTOMY TUBE MOD SED  09/24/2020   IR IMAGING GUIDED PORT INSERTION  09/24/2020   KNEE SURGERY     LARYNGOSCOPY AND BRONCHOSCOPY N/A 04/01/2019   Procedure: BRONCHOSCOPY;  Surgeon: Jodi Marble, MD;  Location: [facility];  Service: ENT;  Laterality: N/A;   RADICAL NECK DISSECTION N/A 01/06/2020   Procedure: RADICAL NECK DISSECTION;  Surgeon: Izora Gala, MD;  Location: [facility];  Service: ENT;  Laterality: N/A;     reports that he quit smoking about 10 months ago. His smoking use included cigarettes. He has never used smokeless tobacco. He reports current alcohol use of about 4.0 standard drinks of alcohol per week. He reports current drug use. Drug: Cocaine.  No Known Allergies  Family History  Problem Relation Age of Onset   Breast cancer  Sister    Colon cancer Brother 11       ????   Cancer Brother     Prior to Admission medications   Medication Sig Start Date End Date Taking? Authorizing Provider  atropine 1 % ophthalmic solution Place 1 drop into the right eye 2 (two) times a day.    Yes [provider]  dexamethasone (DECADRON) 2 MG tablet Take 1 tablet (2 mg total) by mouth daily. 03/26/21  Yes Iruku, Arletha Pili, MD  dexamethasone (DECADRON) 4 MG tablet Take 2 tablets (8 mg total) by mouth 2 (two) times daily. Start the day before Taxotere. Then daily after chemo for 2 days. Patient taking  differently: Take 8 mg by mouth See admin instructions. Start the day before Taxotere, take twice daily, then daily after chemo for 2 days. 03/05/21  Yes Iruku, Arletha Pili, MD  lidocaine (XYLOCAINE) 2 % solution Use as directed 10 mLs in the mouth or throat every 6 (six) hours as needed for mouth pain. Mix 1 part of lidocaine with 1 part of water, swish and swallow 03/28/21  Yes Iruku, Arletha Pili, MD  lidocaine-prilocaine (EMLA) cream Apply to affected area once Patient taking differently: Apply 1 application topically daily as needed (access port). 03/05/21  Yes [name], MD  losartan (COZAAR) 50 MG tablet Take 50 mg by mouth daily. 02/24/20  Yes [provider]  Nutritional Supplements (K[employer]MS STANDARD 1.4) LIQD 325 mLs by Enteral route 4 (four) times daily. Give 1 carton (325 ml) [employer] 1.4 via tube four times daily. Flush tube with 90 ml free water before and after each bolus feed. Give additional 240 ml (1 cup) water via tube daily. Provides 1820 kcal, 80 grams protein, 1896 ml total water. Meets 100% needs 04/09/21  Yes Iruku, Arletha Pili, MD  ondansetron (ZOFRAN) 8 MG tablet Take 1 tablet (8 mg total) by mouth 2 (two) times daily as needed for refractory nausea / vomiting. 03/05/21  Yes [name], MD  oxyCODONE (OXY IR/ROXICODONE) 5 MG immediate release tablet Take 1 tablet (5 mg total) by mouth every 6 (six) hours as needed for moderate pain. 03/26/21  Yes [name], MD  prednisoLONE acetate (PRED FORTE) 1 % ophthalmic suspension Place 1 drop into the right eye 4 (four) times daily.   Yes [provider]  prochlorperazine (COMPAZINE) 10 MG tablet Take 1 tablet (10 mg total) by mouth every 6 (six) hours as needed (Nausea or vomiting). 03/05/21  Yes [name], MD  Vitamin D, Ergocalciferol, (DRISDOL) 1.25 MG (50000 UNIT) CAPS capsule Take 50,000 Units by mouth every Saturday. 05/22/20  Yes [provider]    Physical Exam: Constitutional: Moderately  built and poorly nourished. Vitals:   04/12/21 0400 04/12/21 0500 04/12/21 0600 04/12/21 0730  BP: 130/82 137/89 (!) 137/100 (!) 142/113  Pulse: (!) 109 (!) 108 (!) 110 96  Resp: '15 16 18 18  '$ Temp:      TempSrc:      SpO2: 100% 98% 98% 93%  Weight:      Height:       Eyes: Anicteric no pallor. ENMT: No discharge from the ears eyes nose and mouth. Neck: No mass felt.  No neck rigidity. Respiratory: No rhonchi or crepitations. Cardiovascular: S1-S2 heard. Abdomen: Diffuse abdominal tenderness present.  Gastrostomy tube seen.  At the time of my exam no definite bleeding seen. Musculoskeletal: No edema. Skin: No rash. Neurologic: Alert awake oriented to time place and person.  Moves all extremities. Psychiatric: Appears normal.  Normal affect.   Labs on Admission: I have personally reviewed following labs and imaging studies  CBC: Recent Labs  Lab 04/12/21 0147  WBC 21.4*  NEUTROABS 19.6*  HGB 9.9*  HCT 30.5*  MCV 96.2  PLT [id number]   Basic Metabolic Panel: Recent Labs  Lab 04/12/21 0147  NA 134*  K 3.6  CL 92*  CO2 26  GLUCOSE 120*  BUN 16  CREATININE 0.48*  CALCIUM 9.1   GFR: Estimated Creatinine Clearance: 64.1 mL/min (A) (by C-G formula based on SCr of 0.48 mg/dL (L)). Liver Function Tests: Recent Labs  Lab 04/12/21 0147  AST 14*  ALT 12  ALKPHOS 57  BILITOT 1.5*  PROT 6.7  ALBUMIN 3.4*   Recent Labs  Lab 04/12/21 0147  LIPASE 72*   No results for input(s): AMMONIA in the last 168 hours. Coagulation Profile: No results for input(s): INR, PROTIME in the last 168 hours. Cardiac Enzymes: No results for input(s): CKTOTAL, CKMB, CKMBINDEX, TROPONINI in the last 168 hours. BNP (last 3 results) No results for input(s): PROBNP in the last 8760 hours. HbA1C: No results for input(s): HGBA1C in the last 72 hours. CBG: No results for input(s): GLUCAP in the last 168 hours. Lipid Profile: No results for input(s): CHOL, HDL, LDLCALC, TRIG, CHOLHDL,  LDLDIRECT in the last 72 hours. Thyroid Function Tests: No results for input(s): TSH, T4TOTAL, FREET4, T3FREE, THYROIDAB in the last 72 hours. Anemia Panel: No results for input(s): VITAMINB12, FOLATE, FERRITIN, TIBC, IRON, RETICCTPCT in the last 72 hours. Urine analysis:    Component Value Date/Time   COLORURINE STRAW (A) 04/12/2021 0420   APPEARANCEUR CLEAR 04/12/2021 0420   LABSPEC 1.004 (L) 04/12/2021 0420   PHURINE 6.0 04/12/2021 0420   GLUCOSEU NEGATIVE 04/12/2021 0420   HGBUR NEGATIVE 04/12/2021 0420   BILIRUBINUR NEGATIVE 04/12/2021 0420   KETONESUR 20 (A) 04/12/2021 0420   PROTEINUR NEGATIVE 04/12/2021 0420   UROBILINOGEN 4.0 (H) 10/13/2007 1940   NITRITE NEGATIVE 04/12/2021 0420   LEUKOCYTESUR NEGATIVE 04/12/2021 0420   Sepsis Labs: '@LABRCNTIP'$ (procalcitonin:4,lacticidven:4) ) Recent Results (from the past 240 hour(s))  Resp Panel by RT-PCR (Flu A&B, Covid) Nasopharyngeal Swab     Status: None   Collection Time: 04/12/21  5:46 AM   Specimen: Nasopharyngeal Swab; Nasopharyngeal(NP) swabs in vial transport medium  Result Value Ref Range Status   SARS Coronavirus 2 by RT PCR NEGATIVE NEGATIVE Final    Comment: (NOTE) SARS-CoV-2 target nucleic acids are NOT DETECTED.  The SARS-CoV-2 RNA is generally detectable in upper respiratory specimens during the acute phase of infection. The lowest concentration of SARS-CoV-2 viral copies this assay can detect is 138 copies/mL. A negative result does not preclude SARS-Cov-2 infection and should not be used as the sole basis for treatment or other patient management decisions. A negative result may occur with  improper specimen collection/handling, submission of specimen other than nasopharyngeal swab, presence of viral mutation(s) within the areas targeted by this assay, and inadequate number of viral copies(<138 copies/mL). A negative result must be combined with clinical observations, patient history, and  epidemiological information. The expected result is Negative.  Fact Sheet for Patients:  EntrepreneurPulse.com.au  Fact Sheet for Healthcare Providers:  IncredibleEmployment.be  This test is no t yet approved or cleared by the [place] FDA and  has been authorized for detection and/or diagnosis of SARS-CoV-2 by FDA under an Emergency Use Authorization (EUA). This EUA will remain  in effect (meaning this test can be used) for the duration of the  COVID-19 declaration under Section 564(b)(1) of the Act, 21 U.S.C.section 360bbb-3(b)(1), unless the authorization is terminated  or revoked sooner.       Influenza A by PCR NEGATIVE NEGATIVE Final   Influenza B by PCR NEGATIVE NEGATIVE Final    Comment: (NOTE) The Xpert Xpress SARS-CoV-2/FLU/RSV plus assay is intended as an aid in the diagnosis of influenza from Nasopharyngeal swab specimens and should not be used as a sole basis for treatment. Nasal washings and aspirates are unacceptable for Xpert Xpress SARS-CoV-2/FLU/RSV testing.  Fact Sheet for Patients: EntrepreneurPulse.com.au  Fact Sheet for Healthcare Providers: IncredibleEmployment.be  This test is not yet approved or cleared by the [place] FDA and has been authorized for detection and/or diagnosis of SARS-CoV-2 by FDA under an Emergency Use Authorization (EUA). This EUA will remain in effect (meaning this test can be used) for the duration of the COVID-19 declaration under Section 564(b)(1) of the Act, 21 U.S.C. section 360bbb-3(b)(1), unless the authorization is terminated or revoked.  Performed at [facility], [place] [address] [place], [place] [address]      Radiological Exams on Admission: DG Chest 1 View  Result Date: 04/12/2021 CLINICAL DATA:  History of throat cancer with abdominal pain. EXAM: CHEST  1 VIEW COMPARISON:  January 10, 2021 FINDINGS: There is stable  left-sided venous Port-A-Cath positioning. The heart size and mediastinal contours are within normal limits. Both lungs are clear. Radiopaque contrast is seen within the gastric lumen. The visualized skeletal structures are unremarkable. IMPRESSION: No active disease. Electronically Signed   By: Virgina Norfolk M.D.   On: 04/12/2021 03:35   CT ABDOMEN PELVIS W CONTRAST  Result Date: 04/12/2021 CLINICAL DATA:  Nausea, vomiting, blood in gastrostomy catheter EXAM: CT ABDOMEN AND PELVIS WITH CONTRAST TECHNIQUE: Multidetector CT imaging of the abdomen and pelvis was performed using the standard protocol following bolus administration of intravenous contrast. CONTRAST:  5m OMNIPAQUE IOHEXOL 350 MG/ML SOLN COMPARISON:  PET-CT 01/31/2021, and previous FINDINGS: Lower chest: No pleural or pericardial effusion. 1.3 cm pleural-based nodule in the right middle lobe, new since previous. Hepatobiliary: No focal liver abnormality is seen. No gallstones, gallbladder wall thickening, or biliary dilatation. Pancreas: Unremarkable. No pancreatic ductal dilatation or surrounding inflammatory changes. Spleen: Normal in size without focal abnormality. Adrenals/Urinary Tract: Adrenal glands unremarkable. 1 cm probable cyst, mid left kidney. No hydronephrosis. Contrast material in the renal collecting systems and nondistended urinary bladder. Stomach/Bowel: The gastrostomy tube projects in expected location. There is dense contrast in the partially distended stomach. Small bowel is decompressed, with incomplete distal passage of oral contrast material. The colon is decompressed, unremarkable. Vascular/Lymphatic: Scattered calcified aortoiliac plaque without aneurysm or evident stenosis. Portal vein patent. No abdominal or pelvic adenopathy. Reproductive: Prostate is unremarkable. Other: There is a moderate amount of high attenuation fluid in the upper and anterior abdomen suggesting leak of oral contrast material, presumably from  along the gastrostomy tract although there is no definite hyperdense leak adjacent to the gastrostomy catheter. Small volume fluid-attenuation pelvic ascites without evident loculation. No free air. Musculoskeletal: Mild L3 compression deformity, stable since 11/10/2019. Multilevel lumbar spondylitic change. No acute fracture or worrisome bone lesion. IMPRESSION: 1. Contrast material in the peritoneal cavity of the upper abdomen suggesting leak along the gastrostomy tract. Critical Value/emergent results were called by telephone at the time of interpretation on 04/12/2021 at 5:38 am [facility] verbally acknowledged these results. 2. Gastrostomy tube is appropriately positioned. No evidence of abscess. 3. 1.3 cm  right middle lobe nodule, new since 01/31/2021. Favor infectious/inflammatory over primary/secondary neoplasm given the relatively short time frame of development. Electronically Signed   By: Lucrezia Europe M.D.   On: 04/12/2021 05:41   DG ABDOMEN PEG TUBE LOCATION  Result Date: 04/12/2021 CLINICAL DATA:  Peg tube location. EXAM: ABDOMEN - 1 VIEW COMPARISON:  None. FINDINGS: A percutaneous gastrostomy tube is seen with its distal tip and insufflator bulb overlying left upper quadrant. Following injection of 30 mL of Omnipaque into the peg tube, radiopaque contrast is seen throughout the gastric lumen. No contrast extravasation is identified. The bowel gas pattern is normal. No radio-opaque calculi are seen. IMPRESSION: Percutaneous gastrostomy tube positioned within the body of the stomach. Electronically Signed   By: Virgina Norfolk M.D.   On: 04/12/2021 02:38     Assessment/Plan Principal Problem:   Gastrostomy tube dysfunction (HCC) Active Problems:   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HCC)    Contrast extravasation concerning for leak along the gastrostomy tube site concerning for possible peritonitis -I have called general surgery for consult we will keep  patient n.p.o. we will and IV antibiotics and pain relief medications.  We will follow surgery recommendations. History of hypertension we will keep patient n.p.o. and IV hydralazine until patient can take p.o. History of alcohol abuse still uses alcohol through the gastrostomy tube.  We will keep patient on thiamine and closely watch for any withdrawal. Chronic anemia follow CBC. Leukocytosis could be from infectious source present on antibiotics. History of squamous cell carcinoma of the supraglottis.  Being followed by oncologist.  Since patient has signs concerning for leaking gastrostomy tract into the peritoneum concerning for peritonitis will need inpatient status.   DVT prophylaxis: SCDs.  Avoid anticoagulation anticipation of procedure. Code Status: Full code. Family Communication: Discussed with patient. Disposition Plan: To be determined. Consults called: General surgery. Admission status: Inpatient.   [name] MD [facility] Pager [phone].  If 7PM-7AM, please contact night-coverage [email or web address] Password [facility]  04/12/2021, 9:19 AM

## 2021-04-12 NOTE — Anesthesia Procedure Notes (Addendum)
Procedure Name: Intubation Date/Time: 04/12/2021 11:55 AM Performed by: Sharlette Dense, CRNA Patient Re-evaluated:Patient Re-evaluated prior to induction Oxygen Delivery Method: Circle system utilized Preoxygenation: Pre-oxygenation with 100% oxygen Induction Type: IV induction and Rapid sequence Laryngoscope Size: Glidescope and 3 Grade View: Grade I Tube type: Oral Tube size: 7.0 mm Number of attempts: 1 Airway Equipment and Method: Stylet and Video-laryngoscopy Placement Confirmation: ETT inserted through vocal cords under direct vision, positive ETCO2 and breath sounds checked- equal and bilateral Secured at: 21 cm Tube secured with: Tape Dental Injury: Teeth and Oropharynx as per pre-operative assessment  Comments: Previous radical neck dissection

## 2021-04-12 NOTE — Consult Note (Addendum)
[name] 08-05-1951  [id number].    Requesting MD: Dr. Hal Hope Chief Complaint/Reason for Consult: G-tube malfunction  HPI: [name] is a 70 y.o. male w/ laryngeal cancer w/ hx of radiation and currently undergoing chemo, hep C, HTN, presence of G-tube who presented with bleeding around g-tube. Patient has hx of g-tube insertion by IR in Jan 2022 by IR. Patient reports that yesterday he began having nausea and emesis followed by acutely worsening pain in his abdomen that is constant, severe, and generalized. He also had some bleeding into the tubing of his g-tube. He felt feverish and reports chills. Nothing seemed to make this better or worse. He underwent a CT scan that showed Contrast material in the peritoneal cavity of the upper abdomen suggesting leak along the gastrostomy tract. We were asked to see. He denies other abdominal surgeries. He is not on any blood thinners at home.   ROS: Review of Systems  Constitutional:  Positive for chills and fever.  Eyes:        Chronic blindness of the right eye reported   Respiratory:  Positive for shortness of breath. Negative for cough.   Cardiovascular:  Negative for chest pain and leg swelling.  Gastrointestinal:  Positive for abdominal pain, nausea and vomiting.  All other systems reviewed and are negative.  Family History  Problem Relation Age of Onset   Breast cancer Sister    Colon cancer Brother 74       ????   Cancer Brother     Past Medical History:  Diagnosis Date   Cancer ([place])    Throat cancer 2019   ETOH abuse    Frequent urination    Glaucoma    Hepatitis C virus infection without hepatic coma    dx'ed in 11/2018   History of radiation therapy 05/12/19- 07/06/19   Larynx   Hypertension    Wears denture    upper only; lost lower denture    Past Surgical History:  Procedure Laterality Date   ANKLE SURGERY  2011   right ankle   COLONOSCOPY  02/2019   polyps - Dr Havery Moros   DIRECT LARYNGOSCOPY  N/A 04/01/2019   Procedure: DIRECT LARYNGOSCOPY WITH BIOPSY;  Surgeon: Jodi Marble, MD;  Location: [facility];  Service: ENT;  Laterality: N/A;   DIRECT LARYNGOSCOPY N/A 01/06/2020   Procedure: DIRECT LARYNGOSCOPY;  Surgeon: Izora Gala, MD;  Location: [facility];  Service: ENT;  Laterality: N/A;   DIRECT LARYNGOSCOPY N/A 08/13/2020   Procedure: DIRECT LARYNGOSCOPY;  Surgeon: Izora Gala, MD;  Location: [facility];  Service: ENT;  Laterality: N/A;   ESOPHAGOSCOPY N/A 04/01/2019   Procedure: ESOPHAGOSCOPY;  Surgeon: Jodi Marble, MD;  Location: [facility];  Service: ENT;  Laterality: N/A;   EXCISION ORAL TUMOR Right 08/13/2020   Procedure: BIOPSY OF OROPHARYNGEAL MASS;  Surgeon: Izora Gala, MD;  Location: [facility];  Service: ENT;  Laterality: Right;   EYE SURGERY Right    IR CM INJ ANY COLONIC TUBE W/FLUORO  12/20/2020   IR CM INJ ANY COLONIC TUBE W/FLUORO  03/20/2021   IR GASTROSTOMY TUBE MOD SED  09/24/2020   IR IMAGING GUIDED PORT INSERTION  09/24/2020   KNEE SURGERY     LARYNGOSCOPY AND BRONCHOSCOPY N/A 04/01/2019   Procedure: BRONCHOSCOPY;  Surgeon: Jodi Marble, MD;  Location: [facility];  Service: ENT;  Laterality: N/A;   RADICAL NECK DISSECTION N/A 01/06/2020   Procedure: RADICAL NECK DISSECTION;  Surgeon:  Izora Gala, MD;  Location[facility];  Service: ENT;  Laterality: N/A;    Social History:  reports that he quit smoking about 10 months ago. His smoking use included cigarettes. He has never used smokeless tobacco. He reports current alcohol use of about 4.0 standard drinks of alcohol per week. He reports current drug use. Drug: Cocaine. Lives at home with his sister [name] Reports he does not use any assistive devices to ambulate Prior tobacco use Prior cocaine use  Allergies: No Known Allergies  (Not in a hospital admission)    Physical Exam: Blood pressure (!) 142/113, pulse 96, temperature (!) 97.4 F (36.3 C), temperature source Oral, resp. rate 18, height  '5\' 8"'$  (1.727 m), weight 50.6 kg, SpO2 93 %. General: Frail, elderly AA male who appears chronically ill and appears uncomfortable in some distress HEENT: head is normocephalic, atraumatic.  Blind in right eye. L Sclera are noninjected.  PERRL on L.  Ears and nose without any masses or lesions.  Mouth is pink and moist. Edentulous  Heart: tachycardic with regular rhythm.  No obvious murmurs, gallops, or rubs noted.  Palpable radial and pedal pulses bilaterally  Lungs: Distant breath sounds at the bases w/o wheezes, rhonchi, or rales noted. Upper lung fields clear. Respiratory effort nonlabored. On O2 Abd: Distended abdomen that is firm with generalized tenderness and guarding. Hypoactive bowel sounds. G-tube present. No masses, hernias, or organomegaly MS: no BUE/BLE edema, calves soft and nontender Skin: warm and dry with no masses, lesions, or rashes Psych: A&Ox3 with an appropriate affect Neuro: Blind R eye. Cranial nerves otherwise grossly intact, able speech, thought process intact, moves all extremities, gait not assessed   Results for orders placed or performed during the hospital encounter of 04/12/21 (from the past 48 hour(s))  CBC with Differential     Status: Abnormal   Collection Time: 04/12/21  1:47 AM  Result Value Ref Range   WBC 21.4 (H) 4.0 - 10.5 K/uL   RBC 3.17 (L) 4.22 - 5.81 MIL/uL   Hemoglobin 9.9 (L) 13.0 - 17.0 g/dL   HCT 30.5 (L) 39.0 - 52.0 %   MCV 96.2 80.0 - 100.0 fL   MCH 31.2 26.0 - 34.0 pg   MCHC 32.5 30.0 - 36.0 g/dL   RDW 14.9 11.5 - 15.5 %   Platelets 381 150 - 400 K/uL   nRBC 0.0 0.0 - 0.2 %   Neutrophils Relative % 91 %   Neutro Abs 19.6 (H) 1.7 - 7.7 K/uL   Lymphocytes Relative 2 %   Lymphs Abs 0.3 (L) 0.7 - 4.0 K/uL   Monocytes Relative 4 %   Monocytes Absolute 0.8 0.1 - 1.0 K/uL   Eosinophils Relative 0 %   Eosinophils Absolute 0.0 0.0 - 0.5 K/uL   Basophils Relative 0 %   Basophils Absolute 0.0 0.0 - 0.1 K/uL   WBC Morphology TOXIC  GRANULATION     Comment: VACUOLATED NEUTROPHILS   Immature Granulocytes 3 %   Abs Immature Granulocytes 0.68 (H) 0.00 - 0.07 K/uL    Comment: Performed at [facility], [name] [address] [place],  [address]  Comprehensive metabolic panel     Status: Abnormal   Collection Time: 04/12/21  1:47 AM  Result Value Ref Range   Sodium 134 (L) 135 - 145 mmol/L   Potassium 3.6 3.5 - 5.1 mmol/L   Chloride 92 (L) 98 - 111 mmol/L   CO2 26 22 - 32 mmol/L   Glucose, Bld  120 (H) 70 - 99 mg/dL    Comment: Glucose reference range applies only to samples taken after fasting for at least 8 hours.   BUN 16 8 - 23 mg/dL   Creatinine, Ser 0.48 (L) 0.61 - 1.24 mg/dL   Calcium 9.1 8.9 - 10.3 mg/dL   Total Protein 6.7 6.5 - 8.1 g/dL   Albumin 3.4 (L) 3.5 - 5.0 g/dL   AST 14 (L) 15 - 41 U/L   ALT 12 0 - 44 U/L   Alkaline Phosphatase 57 38 - 126 U/L   Total Bilirubin 1.5 (H) 0.3 - 1.2 mg/dL   GFR, Estimated >60 >60 mL/min    Comment: (NOTE) Calculated using the CKD-EPI Creatinine Equation (2021)    Anion gap 16 (H) 5 - 15    Comment: Performed at [facility], [place] [address], [place], [place] [address]  Lipase, blood     Status: Abnormal   Collection Time: 04/12/21  1:47 AM  Result Value Ref Range   Lipase 72 (H) 11 - 51 U/L    Comment: Performed at [name], [place] [address], [place], [place] [address]  Ethanol     Status: None   Collection Time: 04/12/21  1:50 AM  Result Value Ref Range   Alcohol, Ethyl (B) <10 <10 mg/dL    Comment: (NOTE) Lowest detectable limit for serum alcohol is 10 mg/dL.  For medical purposes only. Performed at [facility], [place] [address], [place], [place] [address]   Urinalysis, Routine w reflex microscopic     Status: Abnormal   Collection Time: 04/12/21  4:20 AM  Result Value Ref Range   Color, Urine STRAW (A) YELLOW   APPearance CLEAR CLEAR   Specific Gravity, Urine 1.004 (L) 1.005 -  1.030   pH 6.0 5.0 - 8.0   Glucose, UA NEGATIVE NEGATIVE mg/dL   Hgb urine dipstick NEGATIVE NEGATIVE   Bilirubin Urine NEGATIVE NEGATIVE   Ketones, ur 20 (A) NEGATIVE mg/dL   Protein, ur NEGATIVE NEGATIVE mg/dL   Nitrite NEGATIVE NEGATIVE   Leukocytes,Ua NEGATIVE NEGATIVE    Comment: Performed at [place] [address] [place],  [address]  Resp Panel by RT-PCR (Flu A&B, Covid) Nasopharyngeal Swab     Status: None   Collection Time: 04/12/21  5:46 AM   Specimen: Nasopharyngeal Swab; Nasopharyngeal(NP) swabs in vial transport medium  Result Value Ref Range   SARS Coronavirus 2 by RT PCR NEGATIVE NEGATIVE    Comment: (NOTE) SARS-CoV-2 target nucleic acids are NOT DETECTED.  The SARS-CoV-2 RNA is generally detectable in upper respiratory specimens during the acute phase of infection. The lowest concentration of SARS-CoV-2 viral copies this assay can detect is 138 copies/mL. A negative result does not preclude SARS-Cov-2 infection and should not be used as the sole basis for treatment or other patient management decisions. A negative result may occur with  improper specimen collection/handling, submission of specimen other than nasopharyngeal swab, presence of viral mutation(s) within the areas targeted by this assay, and inadequate number of viral copies(<138 copies/mL). A negative result must be combined with clinical observations, patient history, and epidemiological information. The expected result is Negative.  Fact Sheet for Patients:  EntrepreneurPulse.com.au  Fact Sheet for Healthcare Providers:  IncredibleEmployment.be  This test is no t yet approved or cleared by the [place] FDA and  has been authorized for detection and/or diagnosis of SARS-CoV-2 by FDA under an Emergency Use Authorization (EUA). This EUA  will remain  in effect (meaning this test can be used) for the duration of the COVID-19  declaration under Section 564(b)(1) of the Act, 21 U.S.C.section 360bbb-3(b)(1), unless the authorization is terminated  or revoked sooner.       Influenza A by PCR NEGATIVE NEGATIVE   Influenza B by PCR NEGATIVE NEGATIVE    Comment: (NOTE) The Xpert Xpress SARS-CoV-2/FLU/RSV plus assay is intended as an aid in the diagnosis of influenza from Nasopharyngeal swab specimens and should not be used as a sole basis for treatment. Nasal washings and aspirates are unacceptable for Xpert Xpress SARS-CoV-2/FLU/RSV testing.  Fact Sheet for Patients: EntrepreneurPulse.com.au  Fact Sheet for Healthcare Providers: IncredibleEmployment.be  This test is not yet approved or cleared by the [place] FDA and has been authorized for detection and/or diagnosis of SARS-CoV-2 by FDA under an Emergency Use Authorization (EUA). This EUA will remain in effect (meaning this test can be used) for the duration of the COVID-19 declaration under Section 564(b)(1) of the Act, 21 U.S.C. section 360bbb-3(b)(1), unless the authorization is terminated or revoked.  Performed at [facility], [place] [address] [place], [place] [address]    DG Chest 1 View  Result Date: 04/12/2021 CLINICAL DATA:  History of throat cancer with abdominal pain. EXAM: CHEST  1 VIEW COMPARISON:  January 10, 2021 FINDINGS: There is stable left-sided venous Port-A-Cath positioning. The heart size and mediastinal contours are within normal limits. Both lungs are clear. Radiopaque contrast is seen within the gastric lumen. The visualized skeletal structures are unremarkable. IMPRESSION: No active disease. Electronically Signed   By: Virgina Norfolk M.D.   On: 04/12/2021 03:35   CT ABDOMEN PELVIS W CONTRAST  Result Date: 04/12/2021 CLINICAL DATA:  Nausea, vomiting, blood in gastrostomy catheter EXAM: CT ABDOMEN AND PELVIS WITH CONTRAST TECHNIQUE: Multidetector CT imaging of the abdomen  and pelvis was performed using the standard protocol following bolus administration of intravenous contrast. CONTRAST:  67m OMNIPAQUE IOHEXOL 350 MG/ML SOLN COMPARISON:  PET-CT 01/31/2021, and previous FINDINGS: Lower chest: No pleural or pericardial effusion. 1.3 cm pleural-based nodule in the right middle lobe, new since previous. Hepatobiliary: No focal liver abnormality is seen. No gallstones, gallbladder wall thickening, or biliary dilatation. Pancreas: Unremarkable. No pancreatic ductal dilatation or surrounding inflammatory changes. Spleen: Normal in size without focal abnormality. Adrenals/Urinary Tract: Adrenal glands unremarkable. 1 cm probable cyst, mid left kidney. No hydronephrosis. Contrast material in the renal collecting systems and nondistended urinary bladder. Stomach/Bowel: The gastrostomy tube projects in expected location. There is dense contrast in the partially distended stomach. Small bowel is decompressed, with incomplete distal passage of oral contrast material. The colon is decompressed, unremarkable. Vascular/Lymphatic: Scattered calcified aortoiliac plaque without aneurysm or evident stenosis. Portal vein patent. No abdominal or pelvic adenopathy. Reproductive: Prostate is unremarkable. Other: There is a moderate amount of high attenuation fluid in the upper and anterior abdomen suggesting leak of oral contrast material, presumably from along the gastrostomy tract although there is no definite hyperdense leak adjacent to the gastrostomy catheter. Small volume fluid-attenuation pelvic ascites without evident loculation. No free air. Musculoskeletal: Mild L3 compression deformity, stable since 11/10/2019. Multilevel lumbar spondylitic change. No acute fracture or worrisome bone lesion. IMPRESSION: 1. Contrast material in the peritoneal cavity of the upper abdomen suggesting leak along the gastrostomy tract. Critical Value/emergent results were called by telephone at the time of  interpretation on 04/12/2021 at 5:38 am [facility], who verbally acknowledged these results. 2. Gastrostomy tube is  appropriately positioned. No evidence of abscess. 3. 1.3 cm right middle lobe nodule, new since 01/31/2021. Favor infectious/inflammatory over primary/secondary neoplasm given the relatively short time frame of development. Electronically Signed   By: Lucrezia Europe M.D.   On: 04/12/2021 05:41   DG ABDOMEN PEG TUBE LOCATION  Result Date: 04/12/2021 CLINICAL DATA:  Peg tube location. EXAM: ABDOMEN - 1 VIEW COMPARISON:  None. FINDINGS: A percutaneous gastrostomy tube is seen with its distal tip and insufflator bulb overlying left upper quadrant. Following injection of 30 mL of Omnipaque into the peg tube, radiopaque contrast is seen throughout the gastric lumen. No contrast extravasation is identified. The bowel gas pattern is normal. No radio-opaque calculi are seen. IMPRESSION: Percutaneous gastrostomy tube positioned within the body of the stomach. Electronically Signed   By: Virgina Norfolk M.D.   On: 04/12/2021 02:38    Anti-infectives (From admission, onward)    Start     Dose/Rate Route Frequency Ordered Stop   04/12/21 1200  piperacillin-tazobactam (ZOSYN) IVPB 3.375 g        3.375 g 12.5 mL/hr over 240 Minutes Intravenous Every 8 hours 04/12/21 0920     04/12/21 0600  piperacillin-tazobactam (ZOSYN) IVPB 3.375 g        3.375 g 100 mL/hr over 30 Minutes Intravenous  Once 04/12/21 0549 04/12/21 [id number]       Assessment/Plan Peritonitis  Presence of G-tube  - CT scan showed contrast material in the peritoneal cavity of the upper abdomen suggesting leak along the gastrostomy tract.  - Patient is peritonitic on exam. Seen with my attending, Dr. Thermon Leyland, who recommends emergency exploratory laparotomy. He discussed indications and risks with the patient who agrees to proceed. Cont IV abx. Keep NPO. Patient has asked me to reach out to his sister [name] which I will  do.   FEN - NPO, IVF VTE - SCDs ID - Zosyn  Hx laryngeal cancer w/ hx of radiation and currently undergoing chemo Hep C HTN   Jillyn Ledger, [facility] Surgery 04/12/2021, 10:33 AM Please see Amion for pager number during day hours 7:00am-4:30pm

## 2021-04-12 NOTE — Anesthesia Preprocedure Evaluation (Signed)
Anesthesia Evaluation  Patient identified by MRN, date of birth, ID band Patient awake    Reviewed: Allergy & Precautions, NPO status , Patient's Chart, lab work & pertinent test results  Airway Mallampati: II  TM Distance: >3 FB Neck ROM: Full   Comment: S/P radical neck dissection Dental no notable dental hx.    Pulmonary neg pulmonary ROS, Patient abstained from smoking., former smoker,    Pulmonary exam normal breath sounds clear to auscultation       Cardiovascular hypertension, Normal cardiovascular exam Rhythm:Regular Rate:Normal     Neuro/Psych negative neurological ROS  negative psych ROS   GI/Hepatic (+)     substance abuse  alcohol use and cocaine use, Hepatitis -, CMalnourished and cachectic in appearance   Endo/Other  negative endocrine ROS  Renal/GU negative Renal ROS  negative genitourinary   Musculoskeletal negative musculoskeletal ROS (+)   Abdominal   Peds negative pediatric ROS (+)  Hematology  (+) anemia ,   Anesthesia Other Findings   Reproductive/Obstetrics negative OB ROS                             Anesthesia Physical Anesthesia Plan  ASA: 4  Anesthesia Plan: General   Post-op Pain Management:    Induction: Intravenous  PONV Risk Score and Plan: 2 and Ondansetron, Dexamethasone and Treatment may vary due to age or medical condition  Airway Management Planned: Oral ETT  Additional Equipment:   Intra-op Plan:   Post-operative Plan: Extubation in OR  Informed Consent: I have reviewed the patients History and Physical, chart, labs and discussed the procedure including the risks, benefits and alternatives for the proposed anesthesia with the patient or authorized representative who has indicated his/her understanding and acceptance.     Dental advisory given  Plan Discussed with: CRNA and Surgeon  Anesthesia Plan Comments:          Anesthesia Quick Evaluation

## 2021-04-13 ENCOUNTER — Encounter (HOSPITAL_COMMUNITY): Payer: Self-pay | Admitting: Surgery

## 2021-04-13 DIAGNOSIS — D6481 Anemia due to antineoplastic chemotherapy: Secondary | ICD-10-CM | POA: Diagnosis not present

## 2021-04-13 DIAGNOSIS — T451X5A Adverse effect of antineoplastic and immunosuppressive drugs, initial encounter: Secondary | ICD-10-CM | POA: Diagnosis not present

## 2021-04-13 DIAGNOSIS — Z95828 Presence of other vascular implants and grafts: Secondary | ICD-10-CM | POA: Diagnosis not present

## 2021-04-13 DIAGNOSIS — K9423 Gastrostomy malfunction: Secondary | ICD-10-CM | POA: Diagnosis not present

## 2021-04-13 LAB — DIFFERENTIAL
Abs Immature Granulocytes: 0.97 10*3/uL — ABNORMAL HIGH (ref 0.00–0.07)
Basophils Absolute: 0 10*3/uL (ref 0.0–0.1)
Basophils Relative: 0 %
Eosinophils Absolute: 0 10*3/uL (ref 0.0–0.5)
Eosinophils Relative: 0 %
Immature Granulocytes: 4 %
Lymphocytes Relative: 2 %
Lymphs Abs: 0.5 10*3/uL — ABNORMAL LOW (ref 0.7–4.0)
Monocytes Absolute: 1.3 10*3/uL — ABNORMAL HIGH (ref 0.1–1.0)
Monocytes Relative: 5 %
Neutro Abs: 22.2 10*3/uL — ABNORMAL HIGH (ref 1.7–7.7)
Neutrophils Relative %: 89 %

## 2021-04-13 LAB — COMPREHENSIVE METABOLIC PANEL WITH GFR
ALT: 11 U/L (ref 0–44)
AST: 15 U/L (ref 15–41)
Albumin: 2.8 g/dL — ABNORMAL LOW (ref 3.5–5.0)
Alkaline Phosphatase: 43 U/L (ref 38–126)
Anion gap: 8 (ref 5–15)
BUN: 14 mg/dL (ref 8–23)
CO2: 27 mmol/L (ref 22–32)
Calcium: 8.3 mg/dL — ABNORMAL LOW (ref 8.9–10.3)
Chloride: 100 mmol/L (ref 98–111)
Creatinine, Ser: 0.54 mg/dL — ABNORMAL LOW (ref 0.61–1.24)
GFR, Estimated: >60 mL/min (ref >60)
Glucose, Bld: 132 mg/dL — ABNORMAL HIGH (ref 70–99)
Potassium: 3.5 mmol/L (ref 3.5–5.1)
Sodium: 135 mmol/L (ref 135–145)
Total Bilirubin: 1 mg/dL (ref 0.3–1.2)
Total Protein: 5.5 g/dL — ABNORMAL LOW (ref 6.5–8.1)

## 2021-04-13 LAB — CBC
HCT: 25.9 % — ABNORMAL LOW (ref 39.0–52.0)
Hemoglobin: 8.4 g/dL — ABNORMAL LOW (ref 13.0–17.0)
MCH: 31.5 pg (ref 26.0–34.0)
MCHC: 32.4 g/dL (ref 30.0–36.0)
MCV: 97 fL (ref 80.0–100.0)
Platelets: 292 10*3/uL (ref 150–400)
RBC: 2.67 MIL/uL — ABNORMAL LOW (ref 4.22–5.81)
RDW: 15.1 % (ref 11.5–15.5)
WBC: 25 10*3/uL — ABNORMAL HIGH (ref 4.0–10.5)
nRBC: 0 % (ref 0.0–0.2)

## 2021-04-13 LAB — TRIGLYCERIDES: Triglycerides: 42 mg/dL (ref <150)

## 2021-04-13 LAB — GLUCOSE, CAPILLARY
Glucose-Capillary: 114 mg/dL — ABNORMAL HIGH (ref 70–99)
Glucose-Capillary: 120 mg/dL — ABNORMAL HIGH (ref 70–99)
Glucose-Capillary: 121 mg/dL — ABNORMAL HIGH (ref 70–99)
Glucose-Capillary: 127 mg/dL — ABNORMAL HIGH (ref 70–99)
Glucose-Capillary: 128 mg/dL — ABNORMAL HIGH (ref 70–99)
Glucose-Capillary: 96 mg/dL (ref 70–99)

## 2021-04-13 LAB — PHOSPHORUS: Phosphorus: 2.8 mg/dL (ref 2.5–4.6)

## 2021-04-13 LAB — PREALBUMIN: Prealbumin: 6.4 mg/dL — ABNORMAL LOW (ref 18–38)

## 2021-04-13 LAB — MAGNESIUM: Magnesium: 1.8 mg/dL (ref 1.7–2.4)

## 2021-04-13 MED ORDER — LACTATED RINGERS IV SOLN: INTRAVENOUS | Status: AC

## 2021-04-13 MED ORDER — MAGNESIUM SULFATE 2 GM/50ML IV SOLN
2.0000 g | Freq: Once | INTRAVENOUS | Status: AC
  Administered 2021-04-13: 2 g via INTRAVENOUS
  Filled 2021-04-13: qty 50

## 2021-04-13 MED ORDER — TRAVASOL 10 % IV SOLN: INTRAVENOUS | Status: DC

## 2021-04-13 MED ORDER — INSULIN ASPART 100 UNIT/ML IJ SOLN
0.0000 [IU] | Freq: Four times a day (QID) | INTRAMUSCULAR | Status: DC
  Administered 2021-04-13 – 2021-04-14 (×2): 1 [IU] via SUBCUTANEOUS
  Administered 2021-04-14: 2 [IU] via SUBCUTANEOUS
  Administered 2021-04-14 (×2): 1 [IU] via SUBCUTANEOUS
  Administered 2021-04-15 (×2): 2 [IU] via SUBCUTANEOUS
  Administered 2021-04-15 (×3): 1 [IU] via SUBCUTANEOUS
  Administered 2021-04-16: 2 [IU] via SUBCUTANEOUS
  Administered 2021-04-16: 1 [IU] via SUBCUTANEOUS
  Administered 2021-04-16 – 2021-04-18 (×8): 2 [IU] via SUBCUTANEOUS
  Administered 2021-04-19: 1 [IU] via SUBCUTANEOUS
  Administered 2021-04-19 (×2): 2 [IU] via SUBCUTANEOUS
  Administered 2021-04-19: 1 [IU] via SUBCUTANEOUS
  Administered 2021-04-20: 2 [IU] via SUBCUTANEOUS
  Administered 2021-04-20: 1 [IU] via SUBCUTANEOUS
  Administered 2021-04-20 – 2021-04-21 (×2): 2 [IU] via SUBCUTANEOUS
  Administered 2021-04-21 – 2021-04-22 (×3): 1 [IU] via SUBCUTANEOUS

## 2021-04-13 MED ORDER — POTASSIUM PHOSPHATES 15 MMOLE/5ML IV SOLN
20.0000 mmol | Freq: Once | INTRAVENOUS | Status: AC
  Administered 2021-04-13: 20 mmol via INTRAVENOUS
  Filled 2021-04-13: qty 6.67

## 2021-04-13 MED ORDER — THIAMINE HCL 100 MG/ML IJ SOLN
INTRAVENOUS | Status: AC
  Filled 2021-04-13: qty 432

## 2021-04-13 NOTE — Progress Notes (Addendum)
PHARMACY - TOTAL PARENTERAL NUTRITION CONSULT NOTE   Indication: Duodenal perf s/p repair 7/29  Patient Measurements: Height: '5\' 8"'$  (172.7 cm) Weight: 50.6 kg (111 lb 8.8 oz) IBW/kg (Calculated) : 68.4 TPN AdjBW (KG): 50.6 Body mass index is 16.96 kg/m. Usual Weight:   Assessment:  70 y.o. male w/ laryngeal cancer w/ hx of radiation and currently undergoing chemo. PMH:  hep C, HTN, ETOH abuse; glaucoma.  PTA has PEG but still eats & drinks- had been putting beer & moonshine into PEG tube per RD note 7/26  7/29 presented with bleeding around g-tube. Patient has hx of g-tube insertion by IR in Jan 2022 by IR.  CT suggested leak long gastrostomy tract. 7/29 emergent exp lap: perforated duodenal ulcer, modified Graham patch of duodenal ulcer, Gastrostomy tube exchange  Glucose / Insulin: no hx DM, Decadron 5 mg IV x 1 7/29; on decadron 2 mg po qday PTA Electrolytes:  Lytes wnl Renal: WNL Hepatic:  T bili 1.0, TG 42, PreAlb 6.4 Intake / Output; MIVF: LR at 75 ml/hr GI Imaging:  7/29 CT A/P: suggesting leak along the gastrostomy tract.  GI Surgeries / Procedures:  7/29 emergent exp lap: perforated duodenal ulcer, modified Graham patch of duodenal ulcer, Gastrostomy tube exchange, drain placed Central access: has PAC placed 1/10/202; PICC ordered 7/29 for TPN  TPN start date: 04/13/2021  Central access: Has PAC placed 09/24/20 TPN start date: 7/30  Nutritional Goals (per RD recommendation on 04/10/2019): PTA: [address] 1.4  325 mls QID & flush w/ 90 ml free water before and after each feed to provide 1820 Kcal & 80 gm protein, 1896 total water;   kCal: [id number], Protein: 86-100, Fluid: 1.8 L   Goal TPN rate is 75 mL/hr (provides  g of protein and  kcals per day) 40 ml/hr provides 43 g protein, 983 kCal per 24 hr   Current Nutrition: NPO  Plan:  Mag Sulfate 2gm bolus K Phos 20 mMol bolus  At 1800: Start TPN at 55m/hr at 1800 Electrolytes in TPN: Na 663m/L, K 505mL, Ca  5mE19m, Mg 5mEq44m and Phos 15mmo90m Cl:Ac 1:1 Add IV Thiamine '100mg'$  to TPN, discontinued the IV push daily order Add standard MVI and trace elements to TPN Initiate Sensitive q6h SSI and adjust as needed  Reduce MIVF to 35 mL/hr at 1800 Monitor TPN labs on Mon/Thurs, Lytes in am 7/31  Vonceil [phone],8:44 AM

## 2021-04-13 NOTE — Plan of Care (Signed)

## 2021-04-13 NOTE — Progress Notes (Addendum)
Initial Nutrition Assessment  DOCUMENTATION CODES:   Underweight  INTERVENTION:   TPN per pharmacy  Recommend thiamine '100mg'$  and folic acid '1mg'$  daily added to TPN  Pt at high refeed risk; recommend monitor potassium, magnesium and phosphorus labs daily until stable  NUTRITION DIAGNOSIS:   Inadequate oral intake related to dysphagia as evidenced by NPO status, other (comment) (pt with chronic G-tube).  GOAL:   Patient will meet greater than or equal to 90% of their needs  MONITOR:   Labs, Weight trends, Skin, I & O's  REASON FOR ASSESSMENT:   Consult New TPN/TNA  ASSESSMENT:   70 y.o. male with a h/o metastatic laryngeal SCC s/p radiation/chemo, hep C, etoh abuse, s/p IR G-tube 1/10 (still uses etoh via G-tube) and HTN who is admitted with perforated duodenal ulcer and peritonitis now s/p ex lap with graham patch repair and replacement of 69F G-tube 7/29  RD working remotely.  Unable to reach pt by phone. Per chart review, pt is followed by the RD at the [facility]. Pt originally had IR G-tube placement 09/24/20. Pt continues to use etoh via his G-tube. Pt was previously on Osmolite 1.5 but was recently switched to [employer] 1 carton QID with 79m water flushes before and after each feed along with an additional 2470mfluid flush daily r/t concerns for intolerance as pt reporting nausea and vomiting. RD unsure if pt was truly intolerant to the Osmolite or if symptoms were related to his duodenal ulcer. Surgery attempted G-J tube placement during ex lap but was unsuccessful. Plan is for PICC line and TPN today. Pt is likely at high refeed risk. Per chart, pt appears to be down 19lbs(15%) since January; RD unsure if pt is actually been receiving his tube feeds at home. Highly suspect pt with severe malnutrition; will obtain NFPE at follow-up.   Medications reviewed and include: insulin, protonix, LRS'@35ml'$ /hr, zosyn, Kphos  Labs reviewed: K 3.5 wnl, creat 0.54(L), P 2.8 wnl,  Mg 1.8 wnl Prealbumin 6.4(L) Triglycerides 42 Wbc- 25.0(H), Hgb 8.4(L), Hct 25.9(L) Cbgs- 186, 128, 120, 114 x 24 hrs AIC 5.8(H)- 4/20  NUTRITION - FOCUSED PHYSICAL EXAM: Unable to perform at this time   Diet Order:   Diet Order             Diet NPO time specified  Diet effective now                  EDUCATION NEEDS:   No education needs have been identified at this time  Skin:  Skin Assessment: Reviewed RN Assessment (incision abdomen)  Last BM:  7/27  Height:   Ht Readings from Last 1 Encounters:  04/12/21 '5\' 8"'$  (1.727 m)    Weight:   Wt Readings from Last 1 Encounters:  04/12/21 50.6 kg    Ideal Body Weight:  70 kg  BMI:  Body mass index is 16.96 kg/m.  Estimated Nutritional Needs:   Kcal:  1800-2000kcal/day  Protein:  90-100g/day  Fluid:  1.4-1.6L/day  CaKoleen DistanceS, RD, LDN Please refer to [facility] and/or RD on-call/weekend/after hours pager

## 2021-04-13 NOTE — Progress Notes (Signed)
Secure chat with Dr Tana Coast and Dr Joyice Faster re PICC order.  Pt currently with a PAC that can be used for TNA.  New order from Dr Marcello Moores to cancel PICC order.

## 2021-04-13 NOTE — Evaluation (Signed)
Physical Therapy Evaluation Patient Details Name: [name] MRN: [id number] DOB: [birth year] Today's Date: 04/13/2021   History of Present Illness  70 yo male admitted with G tube dysfunction. S/P G tube exchange,duodenal ulcer repair 04/12/21. Hx of throat ca, ETOH abuse  Clinical Impression  On eval, pt required Min guard for mobility. He walked ~150 feet while pushing IV pole. He tolerated activity well. Assisted pt back to bed at his request. Will follow during hospital stay. Recommend HHPT f/u.     Follow Up Recommendations Home health PT;Supervision - Intermittent    Equipment Recommendations  None recommended by PT    Recommendations for Other Services       Precautions / Restrictions Precautions Precautions: Fall Precaution Comments: R jp drain, L g tube Restrictions Weight Bearing Restrictions: No      Mobility  Bed Mobility Overal bed mobility: Needs Assistance Bed Mobility: Supine to Sit;Sit to Supine     Supine to sit: Supervision;HOB elevated Sit to supine: Supervision;HOB elevated   General bed mobility comments: Supv for safety, lines    Transfers Overall transfer level: Needs assistance   Transfers: Sit to/from Stand Sit to Stand: Min guard         General transfer comment: Min guard for safety.  Ambulation/Gait Ambulation/Gait assistance: Min guard Gait Distance (Feet): 150 Feet Assistive device: IV Pole Gait Pattern/deviations: Step-through pattern;Decreased stride length     General Gait Details: Min guard for safety. Mildly unsteady. Tolerated distance well.  Stairs            Wheelchair Mobility    Modified Rankin (Stroke Patients Only)       Balance Overall balance assessment: Needs assistance         Standing balance support: Single extremity supported Standing balance-Leahy Scale: Fair                               Pertinent Vitals/Pain Pain Assessment: Faces Faces Pain Scale: Hurts little  more Pain Location: abdomen Pain Intervention(s): Monitored during session    Home Living Family/patient expects to be discharged to:: Private residence Living Arrangements: Other relatives Available Help at Discharge: Family Type of Home: House       Home Layout: One level Home Equipment: None      Prior Function Level of Independence: Independent               Hand Dominance        Extremity/Trunk Assessment   Upper Extremity Assessment Upper Extremity Assessment: Overall WFL for tasks assessed    Lower Extremity Assessment Lower Extremity Assessment: Generalized weakness    Cervical / Trunk Assessment Cervical / Trunk Assessment: Normal  Communication   Communication: No difficulties  Cognition Arousal/Alertness: Awake/alert Behavior During Therapy: WFL for tasks assessed/performed Overall Cognitive Status: Within Functional Limits for tasks assessed                                        General Comments      Exercises     Assessment/Plan    PT Assessment Patient needs continued PT services  PT Problem List Decreased strength;Decreased mobility;Decreased activity tolerance       PT Treatment Interventions DME instruction;Therapeutic exercise;Gait training;Functional mobility training;Patient/family education;Therapeutic activities    PT Goals (Current goals can be found in the Care Plan section)  Acute Rehab PT Goals Patient Stated Goal: none stated PT Goal Formulation: With patient Time For Goal Achievement: 04/27/21 Potential to Achieve Goals: Good    Frequency Min 3X/week   Barriers to discharge        Co-evaluation               AM-PAC PT "6 Clicks" Mobility  Outcome Measure Help needed turning from your back to your side while in a flat bed without using bedrails?: A Little Help needed moving from lying on your back to sitting on the side of a flat bed without using bedrails?: A Little Help needed moving  to and from a bed to a chair (including a wheelchair)?: A Little Help needed standing up from a chair using your arms (e.g., wheelchair or bedside chair)?: A Little Help needed to walk in hospital room?: A Little Help needed climbing 3-5 steps with a railing? : A Little 6 Click Score: 18    End of Session   Activity Tolerance: Patient tolerated treatment well Patient left: in bed;with call bell/phone within reach;with bed alarm set   PT Visit Diagnosis: Muscle weakness (generalized) (M62.81)    Time: [id number] PT Time Calculation (min) (ACUTE ONLY): 16 min   Charges:   PT Evaluation $PT Eval Moderate Complexity: 1 Mod             Doreatha Massed, PT Acute Rehabilitation  Office: [phone] Pager: [phone]

## 2021-04-13 NOTE — Progress Notes (Signed)
[facility]                                                                              Patient Demographics  [name], is a 70 y.o. 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  Patient presented to ED with complaints of increasing abdominal pain and bloating around the gastrostomy tube for the last 24 hours PTA.  Also reported vomiting, no diarrhea.  Pain diffusely under the G-tube. 34 CT abdomen showed extravasation of contrast material into the peritoneal cavity of the upper abdomen suggesting leak along the gastrostomy tract   Assessment & Plan    Principal Problem: Abdominal pain secondary to perforated duodenal ulcer, peritonitis -Patient underwent ORIF on 7/29, found to have perforated duodenal ulcer, proximal portion, status post G-tube exchange, Graham patch of duodenal ulcer, postop day #1 -General surgery following, recommended continue n.p.o. status, PICC/TPN for nutrition -Continue pain control, IV Zosyn.  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[place]) -Follows oncology at [facility], status post radiotherapy and concurrent chemotherapy, completed in 10/2020 -  Port-A-Cath in place     Anemia due to antineoplastic chemotherapy -H&H currently at baseline, follow closely  Alcohol use -Continue CIWA with Ativan protocol, thiamine, folate  1.3 cm right  middle lobe nodule -Incidentally seen on CT abdomen, outpatient follow-up by oncology  Moderate protein calorie malnutrition, underweight Estimated body mass index is 16.96 kg/m as calculated from the following:   Height as of this encounter: '5\' 8"'$  (1.727 m).   Weight as of this encounter: 50.6 kg. -Will need TPN   Code Status: full  DVT Prophylaxis:  SCDs Start: 04/12/21 0917   Level of Care: Level of care: Telemetry Family Communication: Discussed all imaging results, lab results, explained to the patient    Disposition Plan:     Status is: Inpatient  Remains inpatient appropriate because:Inpatient level of care appropriate due to severity of illness  Dispo: The patient is from: Home              Anticipated d/c is to: Home              Patient currently is not medically stable to d/c.   Difficult to place patient No      Time Spent in minutes   25 minutes  Procedures:  7/29 : gastrostomy tube exchange, modified Graham patch of duodenal ulcer  Consultants:   General surgery  Antimicrobials:   Anti-infectives (From admission, onward)    Start     Dose/Rate  Route Frequency Ordered Stop   04/12/21 1200  piperacillin-tazobactam (ZOSYN) IVPB 3.375 g        3.375 g 12.5 mL/hr over 240 Minutes Intravenous Every 8 hours 04/12/21 0920     04/12/21 0600  piperacillin-tazobactam (ZOSYN) IVPB 3.375 g        3.375 g 100 mL/hr over 30 Minutes Intravenous  Once 04/12/21 0549 04/12/21 0621          Medications  Scheduled Meds:  atropine  1 drop Right Eye BID   Chlorhexidine Gluconate Cloth  6 each Topical Daily   insulin aspart  0-9 Units Subcutaneous Q6H   pantoprazole (PROTONIX) IV  40 mg Intravenous Q12H   prednisoLONE acetate  1 drop Right Eye QID   Continuous Infusions:  lactated ringers     methocarbamol (ROBAXIN) IV     piperacillin-tazobactam (ZOSYN)  IV 3.375 g (04/13/21 0459)   potassium PHOSPHATE IVPB (in mmol)     TPN ADULT (ION)     PRN  Meds:.methocarbamol (ROBAXIN) IV, morphine injection, ondansetron (ZOFRAN) IV, sodium chloride flush      Subjective:   [name] was seen and examined today.  Has diffuse abdominal pain 5/10, no nausea or vomiting or diarrhea.  Patient denies dizziness, chest pain, shortness of breath, numbess, tingling. No acute events overnight.  No fevers.  Objective:   Vitals:   04/12/21 1730 04/12/21 1804 04/12/21 2156 04/13/21 0438  BP: 114/84 (!) 119/91 132/89 135/90  Pulse: 88 87 88 83  Resp: '11 16 16 20  '$ Temp:  98.5 F (36.9 C) 98.1 F (36.7 C) (!) 97.5 F (36.4 C)  TempSrc:  Oral Oral Oral  SpO2: 98% 98% 100% 100%  Weight:      Height:        Intake/Output Summary (Last 24 hours) at 04/13/2021 1132 Last data filed at 04/13/2021 [id number] Gross per 24 hour  Intake 2358.53 ml  Output 965 ml  Net 1393.53 ml     Wt Readings from Last 3 Encounters:  04/12/21 50.6 kg  03/26/21 50.6 kg  03/14/21 49.8 kg     Exam General: Alert and oriented x 3, NAD Cardiovascular: S1 S2 auscultated,  RRR Respiratory: Clear to auscultation bilaterally, no wheezing, rales or rhonchi, port in the left chest wall Gastrointestinal: Soft, nontender, nondistended, + bowel sounds Ext: no pedal edema bilaterally Neuro: moving all 4 extremities Skin: No rashes Psych: Normal affect and demeanor, alert and oriented x3    Data Reviewed:  I have personally reviewed following labs and imaging studies  Micro Results Recent Results (from the past 240 hour(s))  Resp Panel by RT-PCR (Flu A&B, Covid) Nasopharyngeal Swab     Status: None   Collection Time: 04/12/21  5:46 AM   Specimen: Nasopharyngeal Swab; Nasopharyngeal(NP) swabs in vial transport medium  Result Value Ref Range Status   SARS Coronavirus 2 by RT PCR NEGATIVE NEGATIVE Final    Comment: (NOTE) SARS-CoV-2 target nucleic acids are NOT DETECTED.  The SARS-CoV-2 RNA is generally detectable in upper respiratory specimens during the acute phase  of infection. The lowest concentration of SARS-CoV-2 viral copies this assay can detect is 138 copies/mL. A negative result does not preclude SARS-Cov-2 infection and should not be used as the sole basis for treatment or other patient management decisions. A negative result may occur with  improper specimen collection/handling, submission of specimen other than nasopharyngeal swab, presence of viral mutation(s) within the areas targeted by this assay, and inadequate number of viral copies(<138  copies/mL). A negative result must be combined with clinical observations, patient history, and epidemiological information. The expected result is Negative.  Fact Sheet for Patients:  EntrepreneurPulse.com.au  Fact Sheet for Healthcare Providers:  IncredibleEmployment.be  This test is no t yet approved or cleared by the [place] FDA and  has been authorized for detection and/or diagnosis of SARS-CoV-2 by FDA under an Emergency Use Authorization (EUA). This EUA will remain  in effect (meaning this test can be used) for the duration of the COVID-19 declaration under Section 564(b)(1) of the Act, 21 U.S.C.section 360bbb-3(b)(1), unless the authorization is terminated  or revoked sooner.       Influenza A by PCR NEGATIVE NEGATIVE Final   Influenza B by PCR NEGATIVE NEGATIVE Final    Comment: (NOTE) The Xpert Xpress SARS-CoV-2/FLU/RSV plus assay is intended as an aid in the diagnosis of influenza from Nasopharyngeal swab specimens and should not be used as a sole basis for treatment. Nasal washings and aspirates are unacceptable for Xpert Xpress SARS-CoV-2/FLU/RSV testing.  Fact Sheet for Patients: EntrepreneurPulse.com.au  Fact Sheet for Healthcare Providers: IncredibleEmployment.be  This test is not yet approved or cleared by the [place] FDA and has been authorized for detection and/or diagnosis of  SARS-CoV-2 by FDA under an Emergency Use Authorization (EUA). This EUA will remain in effect (meaning this test can be used) for the duration of the COVID-19 declaration under Section 564(b)(1) of the Act, 21 U.S.C. section 360bbb-3(b)(1), unless the authorization is terminated or revoked.  Performed at [facility], [place] [address], [place], [place] [address]     Radiology Reports DG Chest 1 View  Result Date: 04/12/2021 CLINICAL DATA:  History of throat cancer with abdominal pain. EXAM: CHEST  1 VIEW COMPARISON:  January 10, 2021 FINDINGS: There is stable left-sided venous Port-A-Cath positioning. The heart size and mediastinal contours are within normal limits. Both lungs are clear. Radiopaque contrast is seen within the gastric lumen. The visualized skeletal structures are unremarkable. IMPRESSION: No active disease. Electronically Signed   By: Virgina Norfolk M.D.   On: 04/12/2021 03:35   CT ABDOMEN PELVIS W CONTRAST  Result Date: 04/12/2021 CLINICAL DATA:  Nausea, vomiting, blood in gastrostomy catheter EXAM: CT ABDOMEN AND PELVIS WITH CONTRAST TECHNIQUE: Multidetector CT imaging of the abdomen and pelvis was performed using the standard protocol following bolus administration of intravenous contrast. CONTRAST:  56m OMNIPAQUE IOHEXOL 350 MG/ML SOLN COMPARISON:  PET-CT 01/31/2021, and previous FINDINGS: Lower chest: No pleural or pericardial effusion. 1.3 cm pleural-based nodule in the right middle lobe, new since previous. Hepatobiliary: No focal liver abnormality is seen. No gallstones, gallbladder wall thickening, or biliary dilatation. Pancreas: Unremarkable. No pancreatic ductal dilatation or surrounding inflammatory changes. Spleen: Normal in size without focal abnormality. Adrenals/Urinary Tract: Adrenal glands unremarkable. 1 cm probable cyst, mid left kidney. No hydronephrosis. Contrast material in the renal collecting systems and nondistended urinary bladder.  Stomach/Bowel: The gastrostomy tube projects in expected location. There is dense contrast in the partially distended stomach. Small bowel is decompressed, with incomplete distal passage of oral contrast material. The colon is decompressed, unremarkable. Vascular/Lymphatic: Scattered calcified aortoiliac plaque without aneurysm or evident stenosis. Portal vein patent. No abdominal or pelvic adenopathy. Reproductive: Prostate is unremarkable. Other: There is a moderate amount of high attenuation fluid in the upper and anterior abdomen suggesting leak of oral contrast material, presumably from along the gastrostomy tract although there is no definite hyperdense leak adjacent to the gastrostomy catheter. Small volume fluid-attenuation pelvic  ascites without evident loculation. No free air. Musculoskeletal: Mild L3 compression deformity, stable since 11/10/2019. Multilevel lumbar spondylitic change. No acute fracture or worrisome bone lesion. IMPRESSION: 1. Contrast material in the peritoneal cavity of the upper abdomen suggesting leak along the gastrostomy tract. Critical Value/emergent results were called by telephone at the time of interpretation on 04/12/2021 at 5:38 am to provider [facility] , who verbally acknowledged these results. 2. Gastrostomy tube is appropriately positioned. No evidence of abscess. 3. 1.3 cm right middle lobe nodule, new since 01/31/2021. Favor infectious/inflammatory over primary/secondary neoplasm given the relatively short time frame of development. Electronically Signed   By: Lucrezia Europe M.D.   On: 04/12/2021 05:41   IR Cm Inj Any Colonic Tube W/Fluoro  Result Date: 03/20/2021 INDICATION: 70 year old gentleman with gastrostomy tube returns to interventional radiology with leaking around gastrostomy tube. EXAM: Fluoroscopy guided repositioning of gastrostomy tube MEDICATIONS: None ANESTHESIA/SEDATION: None CONTRAST:  5 mL of Omnipaque 350-administered into the gastric lumen.  FLUOROSCOPY TIME:  Fluoroscopy Time: 0.4 minutes (0.7 mGy). COMPLICATIONS: None immediate. PROCEDURE: Informed written consent was obtained from the patient after a thorough discussion of the procedural risks, benefits and alternatives. All questions were addressed. Maximal Sterile Barrier Technique was utilized including caps, mask, sterile gowns, sterile gloves, sterile drape, hand hygiene and skin antiseptic. A timeout was performed prior to the initiation of the procedure. Contrast administered through the gastrostomy tube showed appropriate positioning within the lumen. There was slack between the bumper and mushroom of the gastrostomy tube. The slack was reduced as this is likely responsible for patient's leakage. IMPRESSION: Leakage of gastrostomy tube likely secondary to incomplete apposition of retention disc is against anterior abdominal wall. External disc was cinched against the patient's skin, which should decreased the leakage. The patient was taught how to adjust the disc if leakage again occurs. Some skin breakdown was noted around the G-tube insertion site. Patient was seen by stoma nurse who recommended OTC Desitin application to the area. Electronically Signed   By: Miachel Roux M.D.   On: 03/20/2021 16:17   DG ABDOMEN PEG TUBE LOCATION  Result Date: 04/12/2021 CLINICAL DATA:  Peg tube location. EXAM: ABDOMEN - 1 VIEW COMPARISON:  None. FINDINGS: A percutaneous gastrostomy tube is seen with its distal tip and insufflator bulb overlying left upper quadrant. Following injection of 30 mL of Omnipaque into the peg tube, radiopaque contrast is seen throughout the gastric lumen. No contrast extravasation is identified. The bowel gas pattern is normal. No radio-opaque calculi are seen. IMPRESSION: Percutaneous gastrostomy tube positioned within the body of the stomach. Electronically Signed   By: Virgina Norfolk M.D.   On: 04/12/2021 02:38   [place] EKG SITE RITE  Result Date: 04/12/2021 If Site  Rite image not attached, placement could not be confirmed due to current cardiac rhythm.   Lab Data:  CBC: Recent Labs  Lab 04/12/21 0147 04/13/21 0355  WBC 21.4* 25.0*  NEUTROABS 19.6* 22.2*  HGB 9.9* 8.4*  HCT 30.5* 25.9*  MCV 96.2 97.0  PLT 381 [id number]   Basic Metabolic Panel: Recent Labs  Lab 04/12/21 0147 04/13/21 0355  NA 134* 135  K 3.6 3.5  CL 92* 100  CO2 26 27  GLUCOSE 120* 132*  BUN 16 14  CREATININE 0.48* 0.54*  CALCIUM 9.1 8.3*  MG  --  1.8  PHOS  --  2.8   GFR: Estimated Creatinine Clearance: 64.1 mL/min (A) (by C-G formula based on SCr of 0.54 mg/dL (L)). Liver Function  Tests: Recent Labs  Lab 04/12/21 0147 04/13/21 0355  AST 14* 15  ALT 12 11  ALKPHOS 57 43  BILITOT 1.5* 1.0  PROT 6.7 5.5*  ALBUMIN 3.4* 2.8*   Recent Labs  Lab 04/12/21 0147  LIPASE 72*   No results for input(s): AMMONIA in the last 168 hours. Coagulation Profile: No results for input(s): INR, PROTIME in the last 168 hours. Cardiac Enzymes: No results for input(s): CKTOTAL, CKMB, CKMBINDEX, TROPONINI in the last 168 hours. BNP (last 3 results) No results for input(s): PROBNP in the last 8760 hours. HbA1C: No results for input(s): HGBA1C in the last 72 hours. CBG: Recent Labs  Lab 04/13/21 0003 04/13/21 0602 04/13/21 0727 04/13/21 1131  GLUCAP 128* 120* 114* 96   Lipid Profile: Recent Labs    04/13/21 0355  TRIG 42   Thyroid Function Tests: No results for input(s): TSH, T4TOTAL, FREET4, T3FREE, THYROIDAB in the last 72 hours. Anemia Panel: No results for input(s): VITAMINB12, FOLATE, FERRITIN, TIBC, IRON, RETICCTPCT in the last 72 hours. Urine analysis:    Component Value Date/Time   COLORURINE STRAW (A) 04/12/2021 0420   APPEARANCEUR CLEAR 04/12/2021 0420   LABSPEC 1.004 (L) 04/12/2021 0420   PHURINE 6.0 04/12/2021 0420   GLUCOSEU NEGATIVE 04/12/2021 0420   HGBUR NEGATIVE 04/12/2021 0420   BILIRUBINUR NEGATIVE 04/12/2021 0420   KETONESUR 20 (A)  04/12/2021 0420   PROTEINUR NEGATIVE 04/12/2021 0420   UROBILINOGEN 4.0 (H) 10/13/2007 1940   NITRITE NEGATIVE 04/12/2021 0420   LEUKOCYTESUR NEGATIVE 04/12/2021 0420     Jilliane Kazanjian M.D. [facility] 04/13/2021, 11:32 AM  Available via Epic secure chat 7am-7pm After 7 pm, please refer to night coverage provider listed on amion.

## 2021-04-13 NOTE — Progress Notes (Signed)
1 Day Post-Op Modified Graham patch of duodenal ulcer Subjective: C/o abd pain, no nausea  Objective: Vital signs in last 24 hours: Temp:  [97.4 F (36.3 C)-98.8 F (37.1 C)] 97.5 F (36.4 C) (07/30 0438) Pulse Rate:  [83-106] 83 (07/30 0438) Resp:  [10-20] 20 (07/30 0438) BP: (112-161)/(74-98) 135/90 (07/30 0438) SpO2:  [95 %-100 %] 100 % (07/30 0438)   Intake/Output from previous day: 07/29 0701 - 07/30 0700 In: 2358.5 [I.V.:1549.4; IV Piggyback:809.2] Out: 965 [Urine:825; Drains:40; Blood:100] Intake/Output this shift: No intake/output data recorded.   General appearance: alert and cooperative GI: normal findings: soft, ND  Incision: no significant drainage  Lab Results:  Recent Labs    04/12/21 0147 04/13/21 0355  WBC 21.4* 25.0*  HGB 9.9* 8.4*  HCT 30.5* 25.9*  PLT 381 292   BMET Recent Labs    04/12/21 0147 04/13/21 0355  NA 134* 135  K 3.6 3.5  CL 92* 100  CO2 26 27  GLUCOSE 120* 132*  BUN 16 14  CREATININE 0.48* 0.54*  CALCIUM 9.1 8.3*   PT/INR No results for input(s): LABPROT, INR in the last 72 hours. ABG No results for input(s): PHART, HCO3 in the last 72 hours.  Invalid input(s): PCO2, PO2  MEDS, Scheduled  atropine  1 drop Right Eye BID   Chlorhexidine Gluconate Cloth  6 each Topical Daily   insulin aspart  0-9 Units Subcutaneous Q6H   pantoprazole (PROTONIX) IV  40 mg Intravenous Q12H   prednisoLONE acetate  1 drop Right Eye QID    Studies/Results: DG Chest 1 View  Result Date: 04/12/2021 CLINICAL DATA:  History of throat cancer with abdominal pain. EXAM: CHEST  1 VIEW COMPARISON:  January 10, 2021 FINDINGS: There is stable left-sided venous Port-A-Cath positioning. The heart size and mediastinal contours are within normal limits. Both lungs are clear. Radiopaque contrast is seen within the gastric lumen. The visualized skeletal structures are unremarkable. IMPRESSION: No active disease. Electronically Signed   By: Virgina Norfolk  M.D.   On: 04/12/2021 03:35   CT ABDOMEN PELVIS W CONTRAST  Result Date: 04/12/2021 CLINICAL DATA:  Nausea, vomiting, blood in gastrostomy catheter EXAM: CT ABDOMEN AND PELVIS WITH CONTRAST TECHNIQUE: Multidetector CT imaging of the abdomen and pelvis was performed using the standard protocol following bolus administration of intravenous contrast. CONTRAST:  59m OMNIPAQUE IOHEXOL 350 MG/ML SOLN COMPARISON:  PET-CT 01/31/2021, and previous FINDINGS: Lower chest: No pleural or pericardial effusion. 1.3 cm pleural-based nodule in the right middle lobe, new since previous. Hepatobiliary: No focal liver abnormality is seen. No gallstones, gallbladder wall thickening, or biliary dilatation. Pancreas: Unremarkable. No pancreatic ductal dilatation or surrounding inflammatory changes. Spleen: Normal in size without focal abnormality. Adrenals/Urinary Tract: Adrenal glands unremarkable. 1 cm probable cyst, mid left kidney. No hydronephrosis. Contrast material in the renal collecting systems and nondistended urinary bladder. Stomach/Bowel: The gastrostomy tube projects in expected location. There is dense contrast in the partially distended stomach. Small bowel is decompressed, with incomplete distal passage of oral contrast material. The colon is decompressed, unremarkable. Vascular/Lymphatic: Scattered calcified aortoiliac plaque without aneurysm or evident stenosis. Portal vein patent. No abdominal or pelvic adenopathy. Reproductive: Prostate is unremarkable. Other: There is a moderate amount of high attenuation fluid in the upper and anterior abdomen suggesting leak of oral contrast material, presumably from along the gastrostomy tract although there is no definite hyperdense leak adjacent to the gastrostomy catheter. Small volume fluid-attenuation pelvic ascites without evident loculation. No free air. Musculoskeletal: Mild L3  compression deformity, stable since 11/10/2019. Multilevel lumbar spondylitic change. No  acute fracture or worrisome bone lesion. IMPRESSION: 1. Contrast material in the peritoneal cavity of the upper abdomen suggesting leak along the gastrostomy tract. Critical Value/emergent results were called by telephone at the time of interpretation on 04/12/2021 at 5:38 am to provider [facility] , who verbally acknowledged these results. 2. Gastrostomy tube is appropriately positioned. No evidence of abscess. 3. 1.3 cm right middle lobe nodule, new since 01/31/2021. Favor infectious/inflammatory over primary/secondary neoplasm given the relatively short time frame of development. Electronically Signed   By: Lucrezia Europe M.D.   On: 04/12/2021 05:41   DG ABDOMEN PEG TUBE LOCATION  Result Date: 04/12/2021 CLINICAL DATA:  Peg tube location. EXAM: ABDOMEN - 1 VIEW COMPARISON:  None. FINDINGS: A percutaneous gastrostomy tube is seen with its distal tip and insufflator bulb overlying left upper quadrant. Following injection of 30 mL of Omnipaque into the peg tube, radiopaque contrast is seen throughout the gastric lumen. No contrast extravasation is identified. The bowel gas pattern is normal. No radio-opaque calculi are seen. IMPRESSION: Percutaneous gastrostomy tube positioned within the body of the stomach. Electronically Signed   By: Virgina Norfolk M.D.   On: 04/12/2021 02:38   [place] EKG SITE RITE  Result Date: 04/12/2021 If Site Rite image not attached, placement could not be confirmed due to current cardiac rhythm.   Assessment: s/p Procedure(s): EXPLORATORY LAPAROTOMY Patient Active Problem List   Diagnosis Date Noted   Gastrostomy tube dysfunction ([place]) 04/12/2021   Anemia due to antineoplastic chemotherapy 03/26/2021   Cancer related pain 03/26/2021   Laryngeal edema 03/11/2021   Nausea and vomiting 01/24/2021   Shortness of breath 01/10/2021   Mucositis due to antineoplastic therapy 01/10/2021   G tube feedings ([place]) 01/10/2021   Chemotherapy-induced diarrhea 10/30/2020   Port-A-Cath in  place 10/23/2020   Cancer of tonsillar fossa ([place]) 08/31/2020   Laryngeal cancer ([place]) 01/06/2020   Metastatic cancer to cervical lymph nodes ([place]) 12/01/2019   Chronic hepatitis C without hepatic coma ([place]) 06/09/2019   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

## 2021-04-14 DIAGNOSIS — Z95828 Presence of other vascular implants and grafts: Secondary | ICD-10-CM | POA: Diagnosis not present

## 2021-04-14 DIAGNOSIS — D6481 Anemia due to antineoplastic chemotherapy: Secondary | ICD-10-CM | POA: Diagnosis not present

## 2021-04-14 DIAGNOSIS — K9423 Gastrostomy malfunction: Secondary | ICD-10-CM | POA: Diagnosis not present

## 2021-04-14 DIAGNOSIS — T451X5A Adverse effect of antineoplastic and immunosuppressive drugs, initial encounter: Secondary | ICD-10-CM | POA: Diagnosis not present

## 2021-04-14 LAB — CBC
HCT: 23.7 % — ABNORMAL LOW (ref 39.0–52.0)
Hemoglobin: 7.6 g/dL — ABNORMAL LOW (ref 13.0–17.0)
MCH: 31.7 pg (ref 26.0–34.0)
MCHC: 32.1 g/dL (ref 30.0–36.0)
MCV: 98.8 fL (ref 80.0–100.0)
Platelets: 244 10*3/uL (ref 150–400)
RBC: 2.4 MIL/uL — ABNORMAL LOW (ref 4.22–5.81)
RDW: 15 % (ref 11.5–15.5)
WBC: 20.9 10*3/uL — ABNORMAL HIGH (ref 4.0–10.5)
nRBC: 0 % (ref 0.0–0.2)

## 2021-04-14 LAB — BASIC METABOLIC PANEL
Anion gap: 7 (ref 5–15)
BUN: 12 mg/dL (ref 8–23)
CO2: 27 mmol/L (ref 22–32)
Calcium: 8.6 mg/dL — ABNORMAL LOW (ref 8.9–10.3)
Chloride: 102 mmol/L (ref 98–111)
Creatinine, Ser: 0.45 mg/dL — ABNORMAL LOW (ref 0.61–1.24)
GFR, Estimated: >60 mL/min (ref >60)
Glucose, Bld: 170 mg/dL — ABNORMAL HIGH (ref 70–99)
Potassium: 3.7 mmol/L (ref 3.5–5.1)
Sodium: 136 mmol/L (ref 135–145)

## 2021-04-14 LAB — GLUCOSE, CAPILLARY
Glucose-Capillary: 147 mg/dL — ABNORMAL HIGH (ref 70–99)
Glucose-Capillary: 184 mg/dL — ABNORMAL HIGH (ref 70–99)
Glucose-Capillary: 144 mg/dL — ABNORMAL HIGH (ref 70–99)
Glucose-Capillary: 135 mg/dL — ABNORMAL HIGH (ref 70–99)

## 2021-04-14 LAB — PHOSPHORUS: Phosphorus: 1.7 mg/dL — ABNORMAL LOW (ref 2.5–4.6)

## 2021-04-14 LAB — MAGNESIUM: Magnesium: 1.8 mg/dL (ref 1.7–2.4)

## 2021-04-14 MED ORDER — MAGNESIUM SULFATE 4 GM/100ML IV SOLN
4.0000 g | Freq: Once | INTRAVENOUS | Status: AC
  Administered 2021-04-14: 4 g via INTRAVENOUS
  Filled 2021-04-14: qty 100

## 2021-04-14 MED ORDER — ACETAMINOPHEN 650 MG RE SUPP
650.0000 mg | RECTAL | Status: DC | PRN
  Administered 2021-04-14: 650 mg via RECTAL
  Filled 2021-04-14: qty 1

## 2021-04-14 MED ORDER — TRAVASOL 10 % IV SOLN
INTRAVENOUS | Status: AC
  Filled 2021-04-14: qty 432

## 2021-04-14 MED ORDER — POTASSIUM PHOSPHATES 15 MMOLE/5ML IV SOLN
30.0000 mmol | Freq: Once | INTRAVENOUS | Status: AC
  Administered 2021-04-14: 30 mmol via INTRAVENOUS
  Filled 2021-04-14: qty 10

## 2021-04-14 NOTE — Progress Notes (Signed)
PHARMACY - TOTAL PARENTERAL NUTRITION CONSULT NOTE   Indication: Duodenal perf s/p repair 7/29  Patient Measurements: Height: '5\' 8"'$  (172.7 cm) Weight: 50.6 kg (111 lb 8.8 oz) IBW/kg (Calculated) : 68.4 TPN AdjBW (KG): 50.6 Body mass index is 16.96 kg/m. Usual Weight:   Assessment:  70 y.o. male w/ laryngeal cancer w/ hx of radiation and currently undergoing chemo. PMH:  hep C, HTN, ETOH abuse; glaucoma.  PTA has PEG but still eats & drinks- had been putting beer & moonshine into PEG tube per RD note 7/26  7/29 presented with bleeding around g-tube. Patient has hx of g-tube insertion by IR in Jan 2022 by IR.  CT suggested leak long gastrostomy tract. 7/29 emergent exp lap: perforated duodenal ulcer, modified Graham patch of duodenal ulcer, Gastrostomy tube exchange  Glucose / Insulin: no hx DM, Decadron 5 mg IV x 1 7/29; on decadron 2 mg po qday PTA CBGs 121 - 147 per 24 hr, 3 units regular insulin Electrolytes:  Phos low, Mag low end of normal - despite boluses 7/30 prior to TPN, other Lytes wnl Renal: WNL Hepatic:  T bili 1.0, TG 42, PreAlb 6.4 Intake / Output; MIVF: UOP not strictly measured, Drain output 16m/24 hr, LR at 35 ml/hr G tube to gravity drainage GI Imaging:  7/29 CT A/P: suggesting leak along the gastrostomy tract.  GI Surgeries / Procedures:  7/29 emergent exp lap: perforated duodenal ulcer, modified Graham patch of duodenal ulcer, Gastrostomy tube exchange, drain placed Central access: has PAC placed 1/10/202; PICC ordered 7/29 for TPN  TPN start date: 04/13/2021  Central access: Has PAC placed 09/24/20, PICC ordered, not placed TPN start date: 7/30  Nutritional Goals (per RD recommendation on 04/10/2019): PTA: KDillard [place] 1.4  325 mls QID & flush w/ 90 ml free water before and after each feed to provide 1820 Kcal & 80 gm protein, 1896 total water;   kCal: [id number] Protein: 86-100, Fluid: 1.8 L   Goal TPN rate is 75 mL/hr (provides  81g of protein and 1850  kcals per day) 40 ml/hr provides 43 g protein, 983 kCal per 24 hr  Plan UGI on POD5   Current Nutrition: NPO  Plan:  Mag Sulfate 4gm bolus K Phos 30 mMol bolus  At 1800: Continue TPN at 454mhr at 1800 Electrolytes in TPN: Na 6036mL, K 26m54m, Ca 5mEq83m Mg 5mEq/69mand Phos 15mmol76mCl:Ac 1:1 Added IV Thiamine '100mg'$  to TPN (7/30), discontinued the IV push daily order Add standard MVI and trace elements to TPN Initiate Sensitive q6h SSI and adjust as needed  Continue MIVF to 35 mL/hr at 1800 Monitor TPN labs on Mon/Thurs  Evianna Chandran, [facility] [phone],8:18 AM

## 2021-04-14 NOTE — Progress Notes (Signed)
2 Days Post-Op Modified Graham patch of duodenal ulcer Subjective: Abd pain better, TPN started  Objective: Vital signs in last 24 hours: Temp:  [98.2 F (36.8 C)-98.9 F (37.2 C)] 98.2 F (36.8 C) (07/31 0510) Pulse Rate:  [79-89] 79 (07/31 0510) Resp:  [15-20] 15 (07/31 0510) BP: (132-142)/(75-95) 140/83 (07/31 0510) SpO2:  [99 %-100 %] 100 % (07/31 0510)   Intake/Output from previous day: 07/30 0701 - 07/31 0700 In: 1922.1 [I.V.:1760.3; IV Piggyback:161.8] Out: 730 [Urine:625; Drains:105] Intake/Output this shift: No intake/output data recorded.   General appearance: alert and cooperative GI: normal findings: soft, ND  Incision: no significant drainage  Lab Results:  Recent Labs    04/13/21 0355 04/14/21 0552  WBC 25.0* 20.9*  HGB 8.4* 7.6*  HCT 25.9* 23.7*  PLT 292 244    BMET Recent Labs    04/13/21 0355 04/14/21 0552  NA 135 136  K 3.5 3.7  CL 100 102  CO2 27 27  GLUCOSE 132* 170*  BUN 14 12  CREATININE 0.54* 0.45*  CALCIUM 8.3* 8.6*    PT/INR No results for input(s): LABPROT, INR in the last 72 hours. ABG No results for input(s): PHART, HCO3 in the last 72 hours.  Invalid input(s): PCO2, PO2  MEDS, Scheduled  atropine  1 drop Right Eye BID   Chlorhexidine Gluconate Cloth  6 each Topical Daily   insulin aspart  0-9 Units Subcutaneous Q6H   pantoprazole (PROTONIX) IV  40 mg Intravenous Q12H   prednisoLONE acetate  1 drop Right Eye QID    Studies/Results: [place] EKG SITE RITE  Result Date: 04/12/2021 If Site Rite image not attached, placement could not be confirmed due to current cardiac rhythm.   Assessment: s/p Procedure(s): EXPLORATORY LAPAROTOMY Patient Active Problem List   Diagnosis Date Noted   Gastrostomy tube dysfunction ([place]) 04/12/2021   Anemia due to antineoplastic chemotherapy 03/26/2021   Cancer related pain 03/26/2021   Laryngeal edema 03/11/2021   Nausea and vomiting 01/24/2021   Shortness of breath 01/10/2021    Mucositis due to antineoplastic therapy 01/10/2021   G tube feedings ([place]) 01/10/2021   Chemotherapy-induced diarrhea 10/30/2020   Port-A-Cath in place 10/23/2020   Cancer of tonsillar fossa ([place]) 08/31/2020   Laryngeal cancer ([place]) 01/06/2020   Metastatic cancer to cervical lymph nodes ([place]) 12/01/2019   Chronic hepatitis C without hepatic coma ([place]) 06/09/2019   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

## 2021-04-14 NOTE — Progress Notes (Signed)
[facility]                                                                              Patient Demographics  [name], is a 70 y.o. 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  Patient presented to ED with complaints of increasing abdominal pain and bloating around the gastrostomy tube for the last 24 hours PTA.  Also reported vomiting, no diarrhea.  Pain diffusely under the G-tube. 4 CT abdomen showed extravasation of contrast material into the peritoneal cavity of the upper abdomen suggesting leak along the gastrostomy tract   Assessment & Plan    Principal Problem: Abdominal pain secondary to perforated duodenal ulcer, peritonitis -Patient underwent ORIF on 7/29, found to have perforated duodenal ulcer, proximal portion, status post G-tube exchange, Graham patch of duodenal ulcer, postop day #1 -Continue n.p.o. status, started on TPN for nutrition -Continue pain control, IV Zosyn, leukocytosis improving -General surgery following, appreciate recommendations  Active Problems:   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[place]) - Follows oncology at [facility], s/p radiotherapy and concurrent chemotherapy, completed in 10/2020 -  Port-A-Cath in place    Anemia due to antineoplastic chemotherapy -H&H trending down, 7.6 -Follow CBC, will transfuse for Hb less than 7.5  Hypophosphatemia, hypoalbuminemia -Currently  on TPN  Alcohol use -Continue CIWA with Ativan protocol, thiamine, folate  1.3 cm right middle lobe nodule -Incidentally seen on CT abdomen, outpatient follow-up by oncology  Moderate protein calorie malnutrition, underweight Estimated body mass index is 16.96 kg/m as calculated from the following:   Height as of this encounter: '5\' 8"'$  (1.727 m).   Weight as of this encounter: 50.6 kg. -Continue TPN   Code Status: full  DVT Prophylaxis:  SCDs Start: 04/12/21 0917   Level of Care: Level of care: Telemetry Family Communication: Discussed all imaging results, lab results, explained to the patient    Disposition Plan:     Status is: Inpatient  Remains inpatient appropriate because:Inpatient level of care appropriate due to severity of illness  Dispo: The patient is from: Home              Anticipated d/c is to: Home              Patient currently is not medically stable to d/c.   Difficult to place patient No      Time Spent in minutes   25 minutes  Procedures:  7/29 : gastrostomy tube exchange, modified Graham patch of duodenal ulcer  Consultants:   General surgery  Antimicrobials:   Anti-infectives (From  admission, onward)    Start     Dose/Rate Route Frequency Ordered Stop   04/12/21 1200  piperacillin-tazobactam (ZOSYN) IVPB 3.375 g        3.375 g 12.5 mL/hr over 240 Minutes Intravenous Every 8 hours 04/12/21 0920     04/12/21 0600  piperacillin-tazobactam (ZOSYN) IVPB 3.375 g        3.375 g 100 mL/hr over 30 Minutes Intravenous  Once 04/12/21 0549 04/12/21 0621          Medications  Scheduled Meds:  atropine  1 drop Right Eye BID   Chlorhexidine Gluconate Cloth  6 each Topical Daily   insulin aspart  0-9 Units Subcutaneous Q6H   pantoprazole (PROTONIX) IV  40 mg Intravenous Q12H   prednisoLONE acetate  1 drop Right Eye QID   Continuous Infusions:  lactated ringers 35 mL/hr at 04/13/21 1900   magnesium sulfate bolus IVPB 4 g (04/14/21 1005)    methocarbamol (ROBAXIN) IV     piperacillin-tazobactam (ZOSYN)  IV 3.375 g (04/14/21 0432)   potassium PHOSPHATE IVPB (in mmol) 30 mmol (04/14/21 1002)   TPN ADULT (ION) 40 mL/hr at 04/13/21 1841   TPN ADULT (ION)     PRN Meds:.methocarbamol (ROBAXIN) IV, morphine injection, ondansetron (ZOFRAN) IV, sodium chloride flush      Subjective:   [name] was seen and examined today.  Abdominal pain currently controlled, no acute nausea vomiting or diarrhea.  No fevers.  No acute issues overnight Objective:   Vitals:   04/13/21 1200 04/13/21 1342 04/13/21 2028 04/14/21 0510  BP: 140/75 (!) 142/95 132/88 140/83  Pulse: 80 79 89 79  Resp:  '18 20 15  '$ Temp:  98.4 F (36.9 C) 98.9 F (37.2 C) 98.2 F (36.8 C)  TempSrc:  Oral Oral Oral  SpO2:  100% 99% 100%  Weight:      Height:        Intake/Output Summary (Last 24 hours) at 04/14/2021 1102 Last data filed at 04/14/2021 1004 Gross per 24 hour  Intake 1622.07 ml  Output 930 ml  Net 692.07 ml     Wt Readings from Last 3 Encounters:  04/12/21 50.6 kg  03/26/21 50.6 kg  03/14/21 49.8 kg   Physical Exam General: Alert and oriented x 3, NAD Cardiovascular: S1 S2 clear, RRR. No pedal edema b/l Respiratory: CTAB Gastrointestinal: Soft, nondistended Ext: no pedal edema bilaterally Neuro: no new deficits Skin: No rashes Psych: flat affect   Data Reviewed:  I have personally reviewed following labs and imaging studies  Micro Results Recent Results (from the past 240 hour(s))  Resp Panel by RT-PCR (Flu A&B, Covid) Nasopharyngeal Swab     Status: None   Collection Time: 04/12/21  5:46 AM   Specimen: Nasopharyngeal Swab; Nasopharyngeal(NP) swabs in vial transport medium  Result Value Ref Range Status   SARS Coronavirus 2 by RT PCR NEGATIVE NEGATIVE Final    Comment: (NOTE) SARS-CoV-2 target nucleic acids are NOT DETECTED.  The SARS-CoV-2 RNA is generally detectable in upper respiratory specimens during the acute phase of  infection. The lowest concentration of SARS-CoV-2 viral copies this assay can detect is 138 copies/mL. A negative result does not preclude SARS-Cov-2 infection and should not be used as the sole basis for treatment or other patient management decisions. A negative result may occur with  improper specimen collection/handling, submission of specimen other than nasopharyngeal swab, presence of viral mutation(s) within the areas targeted by this assay, and inadequate number of viral copies(<138 copies/mL).  A negative result must be combined with clinical observations, patient history, and epidemiological information. The expected result is Negative.  Fact Sheet for Patients:  EntrepreneurPulse.com.au  Fact Sheet for Healthcare Providers:  IncredibleEmployment.be  This test is no t yet approved or cleared by the [place] FDA and  has been authorized for detection and/or diagnosis of SARS-CoV-2 by FDA under an Emergency Use Authorization (EUA). This EUA will remain  in effect (meaning this test can be used) for the duration of the COVID-19 declaration under Section 564(b)(1) of the Act, 21 U.S.C.section 360bbb-3(b)(1), unless the authorization is terminated  or revoked sooner.       Influenza A by PCR NEGATIVE NEGATIVE Final   Influenza B by PCR NEGATIVE NEGATIVE Final    Comment: (NOTE) The Xpert Xpress SARS-CoV-2/FLU/RSV plus assay is intended as an aid in the diagnosis of influenza from Nasopharyngeal swab specimens and should not be used as a sole basis for treatment. Nasal washings and aspirates are unacceptable for Xpert Xpress SARS-CoV-2/FLU/RSV testing.  Fact Sheet for Patients: EntrepreneurPulse.com.au  Fact Sheet for Healthcare Providers: IncredibleEmployment.be  This test is not yet approved or cleared by the [place] FDA and has been authorized for detection and/or diagnosis of SARS-CoV-2  by FDA under an Emergency Use Authorization (EUA). This EUA will remain in effect (meaning this test can be used) for the duration of the COVID-19 declaration under Section 564(b)(1) of the Act, 21 U.S.C. section 360bbb-3(b)(1), unless the authorization is terminated or revoked.  Performed at [facility], [place] [address], [place], [place] [address]     Radiology Reports DG Chest 1 View  Result Date: 04/12/2021 CLINICAL DATA:  History of throat cancer with abdominal pain. EXAM: CHEST  1 VIEW COMPARISON:  January 10, 2021 FINDINGS: There is stable left-sided venous Port-A-Cath positioning. The heart size and mediastinal contours are within normal limits. Both lungs are clear. Radiopaque contrast is seen within the gastric lumen. The visualized skeletal structures are unremarkable. IMPRESSION: No active disease. Electronically Signed   By: Virgina Norfolk M.D.   On: 04/12/2021 03:35   CT ABDOMEN PELVIS W CONTRAST  Result Date: 04/12/2021 CLINICAL DATA:  Nausea, vomiting, blood in gastrostomy catheter EXAM: CT ABDOMEN AND PELVIS WITH CONTRAST TECHNIQUE: Multidetector CT imaging of the abdomen and pelvis was performed using the standard protocol following bolus administration of intravenous contrast. CONTRAST:  49m OMNIPAQUE IOHEXOL 350 MG/ML SOLN COMPARISON:  PET-CT 01/31/2021, and previous FINDINGS: Lower chest: No pleural or pericardial effusion. 1.3 cm pleural-based nodule in the right middle lobe, new since previous. Hepatobiliary: No focal liver abnormality is seen. No gallstones, gallbladder wall thickening, or biliary dilatation. Pancreas: Unremarkable. No pancreatic ductal dilatation or surrounding inflammatory changes. Spleen: Normal in size without focal abnormality. Adrenals/Urinary Tract: Adrenal glands unremarkable. 1 cm probable cyst, mid left kidney. No hydronephrosis. Contrast material in the renal collecting systems and nondistended urinary bladder. Stomach/Bowel:  The gastrostomy tube projects in expected location. There is dense contrast in the partially distended stomach. Small bowel is decompressed, with incomplete distal passage of oral contrast material. The colon is decompressed, unremarkable. Vascular/Lymphatic: Scattered calcified aortoiliac plaque without aneurysm or evident stenosis. Portal vein patent. No abdominal or pelvic adenopathy. Reproductive: Prostate is unremarkable. Other: There is a moderate amount of high attenuation fluid in the upper and anterior abdomen suggesting leak of oral contrast material, presumably from along the gastrostomy tract although there is no definite hyperdense leak adjacent to the gastrostomy catheter. Small volume fluid-attenuation pelvic ascites  without evident loculation. No free air. Musculoskeletal: Mild L3 compression deformity, stable since 11/10/2019. Multilevel lumbar spondylitic change. No acute fracture or worrisome bone lesion. IMPRESSION: 1. Contrast material in the peritoneal cavity of the upper abdomen suggesting leak along the gastrostomy tract. Critical Value/emergent results were called by telephone at the time of interpretation on 04/12/2021 at 5:38 am to provider [facility] , who verbally acknowledged these results. 2. Gastrostomy tube is appropriately positioned. No evidence of abscess. 3. 1.3 cm right middle lobe nodule, new since 01/31/2021. Favor infectious/inflammatory over primary/secondary neoplasm given the relatively short time frame of development. Electronically Signed   By: Lucrezia Europe M.D.   On: 04/12/2021 05:41   IR Cm Inj Any Colonic Tube W/Fluoro  Result Date: 03/20/2021 INDICATION: 70 year old gentleman with gastrostomy tube returns to interventional radiology with leaking around gastrostomy tube. EXAM: Fluoroscopy guided repositioning of gastrostomy tube MEDICATIONS: None ANESTHESIA/SEDATION: None CONTRAST:  5 mL of Omnipaque 350-administered into the gastric lumen. FLUOROSCOPY TIME:   Fluoroscopy Time: 0.4 minutes (0.7 mGy). COMPLICATIONS: None immediate. PROCEDURE: Informed written consent was obtained from the patient after a thorough discussion of the procedural risks, benefits and alternatives. All questions were addressed. Maximal Sterile Barrier Technique was utilized including caps, mask, sterile gowns, sterile gloves, sterile drape, hand hygiene and skin antiseptic. A timeout was performed prior to the initiation of the procedure. Contrast administered through the gastrostomy tube showed appropriate positioning within the lumen. There was slack between the bumper and mushroom of the gastrostomy tube. The slack was reduced as this is likely responsible for patient's leakage. IMPRESSION: Leakage of gastrostomy tube likely secondary to incomplete apposition of retention disc is against anterior abdominal wall. External disc was cinched against the patient's skin, which should decreased the leakage. The patient was taught how to adjust the disc if leakage again occurs. Some skin breakdown was noted around the G-tube insertion site. Patient was seen by stoma nurse who recommended OTC Desitin application to the area. Electronically Signed   By: Miachel Roux M.D.   On: 03/20/2021 16:17   DG ABDOMEN PEG TUBE LOCATION  Result Date: 04/12/2021 CLINICAL DATA:  Peg tube location. EXAM: ABDOMEN - 1 VIEW COMPARISON:  None. FINDINGS: A percutaneous gastrostomy tube is seen with its distal tip and insufflator bulb overlying left upper quadrant. Following injection of 30 mL of Omnipaque into the peg tube, radiopaque contrast is seen throughout the gastric lumen. No contrast extravasation is identified. The bowel gas pattern is normal. No radio-opaque calculi are seen. IMPRESSION: Percutaneous gastrostomy tube positioned within the body of the stomach. Electronically Signed   By: Virgina Norfolk M.D.   On: 04/12/2021 02:38   [place] EKG SITE RITE  Result Date: 04/12/2021 If Site Rite image not  attached, placement could not be confirmed due to current cardiac rhythm.   Lab Data:  CBC: Recent Labs  Lab 04/12/21 0147 04/13/21 0355 04/14/21 0552  WBC 21.4* 25.0* 20.9*  NEUTROABS 19.6* 22.2*  --   HGB 9.9* 8.4* 7.6*  HCT 30.5* 25.9* 23.7*  MCV 96.2 97.0 98.8  PLT 381 292 [id number]   Basic Metabolic Panel: Recent Labs  Lab 04/12/21 0147 04/13/21 0355 04/14/21 0552  NA 134* 135 136  K 3.6 3.5 3.7  CL 92* 100 102  CO2 '26 27 27  '$ GLUCOSE 120* 132* 170*  BUN '16 14 12  '$ CREATININE 0.48* 0.54* 0.45*  CALCIUM 9.1 8.3* 8.6*  MG  --  1.8 1.8  PHOS  --  2.8 1.7*  GFR: Estimated Creatinine Clearance: 64.1 mL/min (A) (by C-G formula based on SCr of 0.45 mg/dL (L)). Liver Function Tests: Recent Labs  Lab 04/12/21 0147 04/13/21 0355  AST 14* 15  ALT 12 11  ALKPHOS 57 43  BILITOT 1.5* 1.0  PROT 6.7 5.5*  ALBUMIN 3.4* 2.8*   Recent Labs  Lab 04/12/21 0147  LIPASE 72*   No results for input(s): AMMONIA in the last 168 hours. Coagulation Profile: No results for input(s): INR, PROTIME in the last 168 hours. Cardiac Enzymes: No results for input(s): CKTOTAL, CKMB, CKMBINDEX, TROPONINI in the last 168 hours. BNP (last 3 results) No results for input(s): PROBNP in the last 8760 hours. HbA1C: No results for input(s): HGBA1C in the last 72 hours. CBG: Recent Labs  Lab 04/13/21 0727 04/13/21 1131 04/13/21 1646 04/13/21 2355 04/14/21 0556  GLUCAP 114* 96 121* 127* 147*   Lipid Profile: Recent Labs    04/13/21 0355  TRIG 42   Thyroid Function Tests: No results for input(s): TSH, T4TOTAL, FREET4, T3FREE, THYROIDAB in the last 72 hours. Anemia Panel: No results for input(s): VITAMINB12, FOLATE, FERRITIN, TIBC, IRON, RETICCTPCT in the last 72 hours. Urine analysis:    Component Value Date/Time   COLORURINE STRAW (A) 04/12/2021 0420   APPEARANCEUR CLEAR 04/12/2021 0420   LABSPEC 1.004 (L) 04/12/2021 0420   PHURINE 6.0 04/12/2021 0420   GLUCOSEU NEGATIVE  04/12/2021 0420   HGBUR NEGATIVE 04/12/2021 0420   BILIRUBINUR NEGATIVE 04/12/2021 0420   KETONESUR 20 (A) 04/12/2021 0420   PROTEINUR NEGATIVE 04/12/2021 0420   UROBILINOGEN 4.0 (H) 10/13/2007 1940   NITRITE NEGATIVE 04/12/2021 0420   LEUKOCYTESUR NEGATIVE 04/12/2021 0420     Jammie Clink M.D. [facility] 04/14/2021, 11:02 AM  Available via Epic secure chat 7am-7pm After 7 pm, please refer to night coverage provider listed on amion.

## 2021-04-14 NOTE — Plan of Care (Signed)

## 2021-04-15 ENCOUNTER — Encounter (HOSPITAL_COMMUNITY): Payer: Self-pay | Admitting: Surgery

## 2021-04-15 DIAGNOSIS — K9423 Gastrostomy malfunction: Secondary | ICD-10-CM | POA: Diagnosis not present

## 2021-04-15 DIAGNOSIS — D6481 Anemia due to antineoplastic chemotherapy: Secondary | ICD-10-CM | POA: Diagnosis not present

## 2021-04-15 DIAGNOSIS — Z95828 Presence of other vascular implants and grafts: Secondary | ICD-10-CM | POA: Diagnosis not present

## 2021-04-15 DIAGNOSIS — T451X5A Adverse effect of antineoplastic and immunosuppressive drugs, initial encounter: Secondary | ICD-10-CM | POA: Diagnosis not present

## 2021-04-15 LAB — MAGNESIUM: Magnesium: 1.9 mg/dL (ref 1.7–2.4)

## 2021-04-15 LAB — DIFFERENTIAL
Abs Immature Granulocytes: 0.27 10*3/uL — ABNORMAL HIGH (ref 0.00–0.07)
Basophils Absolute: 0.1 10*3/uL (ref 0.0–0.1)
Basophils Relative: 1 %
Eosinophils Absolute: 0 10*3/uL (ref 0.0–0.5)
Eosinophils Relative: 0 %
Immature Granulocytes: 2 %
Lymphocytes Relative: 4 %
Lymphs Abs: 0.5 10*3/uL — ABNORMAL LOW (ref 0.7–4.0)
Monocytes Absolute: 0.7 10*3/uL (ref 0.1–1.0)
Monocytes Relative: 5 %
Neutro Abs: 12.2 10*3/uL — ABNORMAL HIGH (ref 1.7–7.7)
Neutrophils Relative %: 88 %

## 2021-04-15 LAB — COMPREHENSIVE METABOLIC PANEL
ALT: 10 U/L (ref 0–44)
AST: 12 U/L — ABNORMAL LOW (ref 15–41)
Albumin: 2.4 g/dL — ABNORMAL LOW (ref 3.5–5.0)
Alkaline Phosphatase: 46 U/L (ref 38–126)
Anion gap: 7 (ref 5–15)
BUN: 9 mg/dL (ref 8–23)
CO2: 30 mmol/L (ref 22–32)
Calcium: 8.4 mg/dL — ABNORMAL LOW (ref 8.9–10.3)
Chloride: 97 mmol/L — ABNORMAL LOW (ref 98–111)
Creatinine, Ser: 0.3 mg/dL — ABNORMAL LOW (ref 0.61–1.24)
GFR, Estimated: >60 mL/min (ref >60)
Glucose, Bld: 157 mg/dL — ABNORMAL HIGH (ref 70–99)
Potassium: 3.7 mmol/L (ref 3.5–5.1)
Sodium: 134 mmol/L — ABNORMAL LOW (ref 135–145)
Total Bilirubin: 0.4 mg/dL (ref 0.3–1.2)
Total Protein: 5.5 g/dL — ABNORMAL LOW (ref 6.5–8.1)

## 2021-04-15 LAB — TRIGLYCERIDES: Triglycerides: 97 mg/dL (ref <150)

## 2021-04-15 LAB — PHOSPHORUS: Phosphorus: 2.7 mg/dL (ref 2.5–4.6)

## 2021-04-15 LAB — CBC
HCT: 23.3 % — ABNORMAL LOW (ref 39.0–52.0)
Hemoglobin: 7.7 g/dL — ABNORMAL LOW (ref 13.0–17.0)
MCH: 31.8 pg (ref 26.0–34.0)
MCHC: 33 g/dL (ref 30.0–36.0)
MCV: 96.3 fL (ref 80.0–100.0)
Platelets: 238 10*3/uL (ref 150–400)
RBC: 2.42 MIL/uL — ABNORMAL LOW (ref 4.22–5.81)
RDW: 14.6 % (ref 11.5–15.5)
WBC: 13.8 10*3/uL — ABNORMAL HIGH (ref 4.0–10.5)
nRBC: 0 % (ref 0.0–0.2)

## 2021-04-15 LAB — PREALBUMIN: Prealbumin: <5 mg/dL — ABNORMAL LOW (ref 18–38)

## 2021-04-15 LAB — GLUCOSE, CAPILLARY
Glucose-Capillary: 135 mg/dL — ABNORMAL HIGH (ref 70–99)
Glucose-Capillary: 139 mg/dL — ABNORMAL HIGH (ref 70–99)
Glucose-Capillary: 152 mg/dL — ABNORMAL HIGH (ref 70–99)
Glucose-Capillary: 156 mg/dL — ABNORMAL HIGH (ref 70–99)

## 2021-04-15 MED ORDER — MAGNESIUM SULFATE IN D5W 1-5 GM/100ML-% IV SOLN
1.0000 g | Freq: Once | INTRAVENOUS | Status: AC
  Administered 2021-04-15: 1 g via INTRAVENOUS
  Filled 2021-04-15: qty 100

## 2021-04-15 MED ORDER — BISACODYL 10 MG RE SUPP
10.0000 mg | Freq: Once | RECTAL | Status: AC
  Administered 2021-04-15: 10 mg via RECTAL
  Filled 2021-04-15: qty 1

## 2021-04-15 MED ORDER — SODIUM CHLORIDE 4 MEQ/ML IV SOLN
INTRAVENOUS | Status: AC
Start: 2021-04-15 — End: 2021-04-16
  Filled 2021-04-15: qty 480

## 2021-04-15 MED ORDER — POTASSIUM PHOSPHATES 15 MMOLE/5ML IV SOLN
10.0000 mmol | Freq: Once | INTRAVENOUS | Status: AC
  Administered 2021-04-15: 10 mmol via INTRAVENOUS
  Filled 2021-04-15: qty 3.33

## 2021-04-15 MED ORDER — ACETAMINOPHEN 10 MG/ML IV SOLN
1000.0000 mg | Freq: Four times a day (QID) | INTRAVENOUS | Status: AC
  Administered 2021-04-15 – 2021-04-16 (×4): 1000 mg via INTRAVENOUS
  Filled 2021-04-15 (×4): qty 100

## 2021-04-15 MED ORDER — LACTATED RINGERS IV SOLN: INTRAVENOUS | Status: AC

## 2021-04-15 NOTE — Progress Notes (Signed)
[facility]                                                                              Patient Demographics  [name], is a 70 y.o. 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  Patient presented to ED with complaints of increasing abdominal pain and bloating around the gastrostomy tube for the last 24 hours PTA.  Also reported vomiting, no diarrhea.  Pain diffusely under the G-tube. 80 CT abdomen showed extravasation of contrast material into the peritoneal cavity of the upper abdomen suggesting leak along the gastrostomy tract   Assessment & Plan    Principal Problem: Abdominal pain secondary to perforated duodenal ulcer, peritonitis -Patient underwent ORIF on 7/29, found to have perforated duodenal ulcer, proximal portion, status post G-tube exchange, Graham patch of duodenal ulcer on 7/29 -General surgery following, continue TPN for nutrition, n.p.o. -Continue IV Zosyn.  Spiking fevers overnight, will check blood cultures -Management per surgery recommendations  Active Problems:   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[place]) - Follows oncology at [facility], s/p radiotherapy and concurrent chemotherapy, completed in 10/2020 -  Port-A-Cath in place    Anemia due to antineoplastic chemotherapy -Hemoglobin 7.7, will transfuse for hemoglobin less than 7  Hypophosphatemia, hypoalbuminemia -Continue  TPN  Alcohol use -CIWA's minimal, 0 at 6 AM  1.3 cm right middle lobe nodule -Incidentally seen on CT abdomen, outpatient follow-up by oncology  Generalized debility -PT OT evaluation, patient declined  Moderate protein calorie malnutrition, underweight Estimated body mass index is 16.96 kg/m as calculated from the following:   Height as of this encounter: '5\' 8"'$  (1.727 m).   Weight as of this encounter: 50.6 kg. -Continue TPN   Code Status: full  DVT Prophylaxis:  SCDs Start: 04/12/21 0917   Level of Care: Level of care: Telemetry Family Communication: Discussed all imaging results, lab results, explained to the patient    Disposition Plan:     Status is: Inpatient  Remains inpatient appropriate because:Inpatient level of care appropriate due to severity of illness  Dispo: The patient is from: Home              Anticipated d/c is to: Home              Patient currently is not medically stable to d/c.   Difficult to place patient No      Time Spent in minutes   25 minutes  Procedures:  7/29 : gastrostomy tube exchange, modified Graham patch of duodenal ulcer  Consultants:   General surgery  Antimicrobials:  Anti-infectives (From admission, onward)    Start     Dose/Rate Route Frequency Ordered Stop   04/12/21 1200  piperacillin-tazobactam (ZOSYN) IVPB 3.375 g        3.375 g 12.5 mL/hr over 240 Minutes Intravenous Every 8 hours 04/12/21 0920     04/12/21 0600  piperacillin-tazobactam (ZOSYN) IVPB 3.375 g        3.375 g 100 mL/hr over 30 Minutes Intravenous  Once 04/12/21 0549 04/12/21 0621          Medications  Scheduled Meds:  atropine  1 drop Right Eye BID   Chlorhexidine Gluconate Cloth  6 each Topical Daily   insulin aspart  0-9 Units Subcutaneous Q6H   pantoprazole (PROTONIX) IV  40 mg Intravenous Q12H   prednisoLONE acetate  1 drop Right Eye QID   Continuous Infusions:  acetaminophen 1,000 mg (04/15/21 1341)   lactated ringers 35 mL/hr  at 04/15/21 1000   lactated ringers     methocarbamol (ROBAXIN) IV     piperacillin-tazobactam (ZOSYN)  IV 3.375 g (04/15/21 1342)   potassium PHOSPHATE IVPB (in mmol) 10 mmol (04/15/21 1343)   TPN ADULT (ION) 40 mL/hr at 04/15/21 1000   TPN ADULT (ION)     PRN Meds:.methocarbamol (ROBAXIN) IV, morphine injection, ondansetron (ZOFRAN) IV, sodium chloride flush      Subjective:   [name] was seen and examined today.  Complaining of abdominal pain however flat affect.  No active nausea or vomiting.  Temp 101.1 F last night    Objective:   Vitals:   04/14/21 2050 04/15/21 0000 04/15/21 0413 04/15/21 1227  BP: 139/85  (!) 142/74 (!) 170/87  Pulse: 90  69 76  Resp: '18  13 18  '$ Temp: (!) 101 F (38.3 C) 98.4 F (36.9 C) 98.3 F (36.8 C) 99 F (37.2 C)  TempSrc: Oral Oral Oral Oral  SpO2: 98%  100% 100%  Weight:      Height:        Intake/Output Summary (Last 24 hours) at 04/15/2021 1354 Last data filed at 04/15/2021 1241 Gross per 24 hour  Intake 1223.1 ml  Output 2250 ml  Net -1026.9 ml     Wt Readings from Last 3 Encounters:  04/12/21 50.6 kg  03/26/21 50.6 kg  03/14/21 49.8 kg   Physical Exam General: Alert and awake, flat affect, NAD Cardiovascular: S1 S2 clear, RRR. No pedal edema b/l Respiratory: CTAB, no wheezing, rales or rhonchi Gastrointestinal: Soft, mild TTP, JP drain +. G tube capped  Ext: no pedal edema bilaterally Neuro: no new deficits Skin: No rashes Psych: flat affect     Data Reviewed:  I have personally reviewed following labs and imaging studies  Micro Results Recent Results (from the past 240 hour(s))  Resp Panel by RT-PCR (Flu A&B, Covid) Nasopharyngeal Swab     Status: None   Collection Time: 04/12/21  5:46 AM   Specimen: Nasopharyngeal Swab; Nasopharyngeal(NP) swabs in vial transport medium  Result Value Ref Range Status   SARS Coronavirus 2 by RT PCR NEGATIVE NEGATIVE Final    Comment: (NOTE) SARS-CoV-2 target nucleic acids  are NOT DETECTED.  The SARS-CoV-2 RNA is generally detectable in upper respiratory specimens during the acute phase of infection. The lowest concentration of SARS-CoV-2 viral copies this assay can detect is 138 copies/mL. A negative result does not preclude SARS-Cov-2 infection and should not be used as the sole basis for treatment or other patient management decisions. A negative result may occur with  improper specimen collection/handling, submission of specimen other than nasopharyngeal swab, presence of viral mutation(s) within the areas targeted by this assay, and inadequate number of viral copies(<138 copies/mL). A negative result must be combined with clinical observations, patient history, and epidemiological information. The expected result is Negative.  Fact Sheet for Patients:  EntrepreneurPulse.com.au  Fact Sheet for Healthcare Providers:  IncredibleEmployment.be  This test is no t yet approved or cleared by the [place] FDA and  has been authorized for detection and/or diagnosis of SARS-CoV-2 by FDA under an Emergency Use Authorization (EUA). This EUA will remain  in effect (meaning this test can be used) for the duration of the COVID-19 declaration under Section 564(b)(1) of the Act, 21 U.S.C.section 360bbb-3(b)(1), unless the authorization is terminated  or revoked sooner.       Influenza A by PCR NEGATIVE NEGATIVE Final   Influenza B by PCR NEGATIVE NEGATIVE Final    Comment: (NOTE) The Xpert Xpress SARS-CoV-2/FLU/RSV plus assay is intended as an aid in the diagnosis of influenza from Nasopharyngeal swab specimens and should not be used as a sole basis for treatment. Nasal washings and aspirates are unacceptable for Xpert Xpress SARS-CoV-2/FLU/RSV testing.  Fact Sheet for Patients: EntrepreneurPulse.com.au  Fact Sheet for Healthcare Providers: IncredibleEmployment.be  This test is  not yet approved or cleared by the [place] FDA and has been authorized for detection and/or diagnosis of SARS-CoV-2 by FDA under an Emergency Use Authorization (EUA). This EUA will remain in effect (meaning this test can be used) for the duration of the COVID-19 declaration under Section 564(b)(1) of the Act, 21 U.S.C. section 360bbb-3(b)(1), unless the authorization is terminated or revoked.  Performed at [facility], [place] [address], [place], [place] [address]     Radiology Reports DG Chest 1 View  Result Date: 04/12/2021 CLINICAL DATA:  History of throat cancer with abdominal pain. EXAM: CHEST  1 VIEW COMPARISON:  January 10, 2021 FINDINGS: There is stable left-sided venous Port-A-Cath positioning. The heart size and mediastinal contours are within normal limits. Both lungs are clear. Radiopaque contrast is seen within the gastric lumen. The visualized skeletal structures are unremarkable. IMPRESSION: No active disease. Electronically Signed   By: Virgina Norfolk M.D.   On: 04/12/2021 03:35   CT ABDOMEN PELVIS W CONTRAST  Result Date: 04/12/2021 CLINICAL DATA:  Nausea, vomiting, blood in gastrostomy catheter EXAM: CT ABDOMEN AND PELVIS WITH CONTRAST TECHNIQUE: Multidetector CT imaging of the abdomen and pelvis was performed using the standard protocol following bolus administration of intravenous contrast. CONTRAST:  80m OMNIPAQUE IOHEXOL 350 MG/ML SOLN COMPARISON:  PET-CT 01/31/2021, and previous FINDINGS: Lower chest: No pleural or pericardial effusion. 1.3 cm pleural-based nodule in the right middle lobe, new since previous. Hepatobiliary: No focal liver abnormality is seen. No gallstones, gallbladder wall thickening, or biliary dilatation. Pancreas: Unremarkable. No pancreatic ductal dilatation or surrounding inflammatory changes. Spleen: Normal in size without focal abnormality. Adrenals/Urinary Tract: Adrenal glands unremarkable. 1 cm probable cyst, mid left  kidney. No hydronephrosis. Contrast material in the renal collecting systems and nondistended urinary bladder. Stomach/Bowel: The gastrostomy tube projects in expected location. There is dense contrast in the partially distended stomach. Small bowel is decompressed, with incomplete distal passage of oral contrast material. The colon is decompressed, unremarkable. Vascular/Lymphatic: Scattered calcified aortoiliac plaque without aneurysm or evident stenosis. Portal vein patent. No abdominal or pelvic adenopathy. Reproductive: Prostate is unremarkable. Other: There is a moderate amount of high attenuation fluid in the upper and anterior abdomen suggesting  leak of oral contrast material, presumably from along the gastrostomy tract although there is no definite hyperdense leak adjacent to the gastrostomy catheter. Small volume fluid-attenuation pelvic ascites without evident loculation. No free air. Musculoskeletal: Mild L3 compression deformity, stable since 11/10/2019. Multilevel lumbar spondylitic change. No acute fracture or worrisome bone lesion. IMPRESSION: 1. Contrast material in the peritoneal cavity of the upper abdomen suggesting leak along the gastrostomy tract. Critical Value/emergent results were called by telephone at the time of interpretation on 04/12/2021 at 5:38 am to provider [facility] , who verbally acknowledged these results. 2. Gastrostomy tube is appropriately positioned. No evidence of abscess. 3. 1.3 cm right middle lobe nodule, new since 01/31/2021. Favor infectious/inflammatory over primary/secondary neoplasm given the relatively short time frame of development. Electronically Signed   By: Lucrezia Europe M.D.   On: 04/12/2021 05:41   IR Cm Inj Any Colonic Tube W/Fluoro  Result Date: 03/20/2021 INDICATION: 70 year old gentleman with gastrostomy tube returns to interventional radiology with leaking around gastrostomy tube. EXAM: Fluoroscopy guided repositioning of gastrostomy tube MEDICATIONS:  None ANESTHESIA/SEDATION: None CONTRAST:  5 mL of Omnipaque 350-administered into the gastric lumen. FLUOROSCOPY TIME:  Fluoroscopy Time: 0.4 minutes (0.7 mGy). COMPLICATIONS: None immediate. PROCEDURE: Informed written consent was obtained from the patient after a thorough discussion of the procedural risks, benefits and alternatives. All questions were addressed. Maximal Sterile Barrier Technique was utilized including caps, mask, sterile gowns, sterile gloves, sterile drape, hand hygiene and skin antiseptic. A timeout was performed prior to the initiation of the procedure. Contrast administered through the gastrostomy tube showed appropriate positioning within the lumen. There was slack between the bumper and mushroom of the gastrostomy tube. The slack was reduced as this is likely responsible for patient's leakage. IMPRESSION: Leakage of gastrostomy tube likely secondary to incomplete apposition of retention disc is against anterior abdominal wall. External disc was cinched against the patient's skin, which should decreased the leakage. The patient was taught how to adjust the disc if leakage again occurs. Some skin breakdown was noted around the G-tube insertion site. Patient was seen by stoma nurse who recommended OTC Desitin application to the area. Electronically Signed   By: Miachel Roux M.D.   On: 03/20/2021 16:17   DG ABDOMEN PEG TUBE LOCATION  Result Date: 04/12/2021 CLINICAL DATA:  Peg tube location. EXAM: ABDOMEN - 1 VIEW COMPARISON:  None. FINDINGS: A percutaneous gastrostomy tube is seen with its distal tip and insufflator bulb overlying left upper quadrant. Following injection of 30 mL of Omnipaque into the peg tube, radiopaque contrast is seen throughout the gastric lumen. No contrast extravasation is identified. The bowel gas pattern is normal. No radio-opaque calculi are seen. IMPRESSION: Percutaneous gastrostomy tube positioned within the body of the stomach. Electronically Signed   By:  Virgina Norfolk M.D.   On: 04/12/2021 02:38   [place] EKG SITE RITE  Result Date: 04/12/2021 If Site Rite image not attached, placement could not be confirmed due to current cardiac rhythm.   Lab Data:  CBC: Recent Labs  Lab 04/12/21 0147 04/13/21 0355 04/14/21 0552 04/15/21 0423  WBC 21.4* 25.0* 20.9* 13.8*  NEUTROABS 19.6* 22.2*  --  12.2*  HGB 9.9* 8.4* 7.6* 7.7*  HCT 30.5* 25.9* 23.7* 23.3*  MCV 96.2 97.0 98.8 96.3  PLT 381 292 244 [id number]   Basic Metabolic Panel: Recent Labs  Lab 04/12/21 0147 04/13/21 0355 04/14/21 0552 04/15/21 0423  NA 134* 135 136 134*  K 3.6 3.5 3.7 3.7  CL 92* 100  102 97*  CO2 '26 27 27 30  '$ GLUCOSE 120* 132* 170* 157*  BUN '16 14 12 9  '$ CREATININE 0.48* 0.54* 0.45* 0.30*  CALCIUM 9.1 8.3* 8.6* 8.4*  MG  --  1.8 1.8 1.9  PHOS  --  2.8 1.7* 2.7   GFR: Estimated Creatinine Clearance: 64.1 mL/min (A) (by C-G formula based on SCr of 0.3 mg/dL (L)). Liver Function Tests: Recent Labs  Lab 04/12/21 0147 04/13/21 0355 04/15/21 0423  AST 14* 15 12*  ALT '12 11 10  '$ ALKPHOS 57 43 46  BILITOT 1.5* 1.0 0.4  PROT 6.7 5.5* 5.5*  ALBUMIN 3.4* 2.8* 2.4*   Recent Labs  Lab 04/12/21 0147  LIPASE 72*   No results for input(s): AMMONIA in the last 168 hours. Coagulation Profile: No results for input(s): INR, PROTIME in the last 168 hours. Cardiac Enzymes: No results for input(s): CKTOTAL, CKMB, CKMBINDEX, TROPONINI in the last 168 hours. BNP (last 3 results) No results for input(s): PROBNP in the last 8760 hours. HbA1C: No results for input(s): HGBA1C in the last 72 hours. CBG: Recent Labs  Lab 04/14/21 1134 04/14/21 1730 04/14/21 2308 04/15/21 0527 04/15/21 1157  GLUCAP 184* 144* 135* 156* 139*   Lipid Profile: Recent Labs    04/13/21 0355 04/15/21 0423  TRIG 42 97   Thyroid Function Tests: No results for input(s): TSH, T4TOTAL, FREET4, T3FREE, THYROIDAB in the last 72 hours. Anemia Panel: No results for input(s): VITAMINB12,  FOLATE, FERRITIN, TIBC, IRON, RETICCTPCT in the last 72 hours. Urine analysis:    Component Value Date/Time   COLORURINE STRAW (A) 04/12/2021 0420   APPEARANCEUR CLEAR 04/12/2021 0420   LABSPEC 1.004 (L) 04/12/2021 0420   PHURINE 6.0 04/12/2021 0420   GLUCOSEU NEGATIVE 04/12/2021 0420   HGBUR NEGATIVE 04/12/2021 0420   BILIRUBINUR NEGATIVE 04/12/2021 0420   KETONESUR 20 (A) 04/12/2021 0420   PROTEINUR NEGATIVE 04/12/2021 0420   UROBILINOGEN 4.0 (H) 10/13/2007 1940   NITRITE NEGATIVE 04/12/2021 0420   LEUKOCYTESUR NEGATIVE 04/12/2021 0420     Epifania Littrell M.D. [facility] 04/15/2021, 1:54 PM  Available via Epic secure chat 7am-7pm After 7 pm, please refer to night coverage provider listed on amion.

## 2021-04-15 NOTE — Progress Notes (Signed)
Lab reported they could not run POCT H pylori. MD notified via amion and secure chat. No response. Lab will save tube for correct order to be placed 04/16/21. RN reported info to Public house manager.

## 2021-04-15 NOTE — Care Management Important Message (Signed)
Important Message  Patient Details IM Letter given to the Patient. Name: ADRIENE AKES MRN: [id number] Date of Birth: 1950-11-24   Medicare Important Message Given:  Yes     Kerin Salen 04/15/2021, 12:06 PM

## 2021-04-15 NOTE — Progress Notes (Addendum)
Progress Note  3 Days Post-Op  Subjective: Having pain at abdominal incision. Passing flatus and small BM today. He has had some nausea but no emesis.    Objective: Vital signs in last 24 hours: Temp:  [98.3 F (36.8 C)-101 F (38.3 C)] 98.3 F (36.8 C) (08/01 0413) Pulse Rate:  [69-90] 69 (08/01 0413) Resp:  [13-18] 13 (08/01 0413) BP: (139-149)/(74-90) 142/74 (08/01 0413) SpO2:  [98 %-100 %] 100 % (08/01 0413) Last BM Date: 05/11/21  Intake/Output from previous day: 07/31 0701 - 08/01 0700 In: 375 [I.V.:375] Out: 860 [Urine:800; Drains:60] Intake/Output this shift: Total I/O In: 615.9 [I.V.:515.9; IV Piggyback:100] Out: 900 [Urine:900]  PE: General: pleasant, male who is sitting up in chair in NAD HEENT: head is normocephalic, atraumatic.   Heart: regular, rate, and rhythm.  Palpable radial pulses bilaterally Lungs: Respiratory effort nonlabored on room air Abd: soft, ND, +BS, mild TTP around midline incision which is covered with honeycomb staples - visible staples intact without erythema or discharge. JP drain with serous output. Gtube capped MSK: all 4 extremities are symmetrical with no cyanosis, clubbing, or edema. No calf TTP Skin: warm and dry with no masses, lesions, or rashes Psych: A&Ox3 with an appropriate affect.    Lab Results:  Recent Labs    04/14/21 0552 04/15/21 0423  WBC 20.9* 13.8*  HGB 7.6* 7.7*  HCT 23.7* 23.3*  PLT 244 238   BMET Recent Labs    04/14/21 0552 04/15/21 0423  NA 136 134*  K 3.7 3.7  CL 102 97*  CO2 27 30  GLUCOSE 170* 157*  BUN 12 9  CREATININE 0.45* 0.30*  CALCIUM 8.6* 8.4*   PT/INR No results for input(s): LABPROT, INR in the last 72 hours. CMP     Component Value Date/Time   NA 134 (L) 04/15/2021 0423   K 3.7 04/15/2021 0423   CL 97 (L) 04/15/2021 0423   CO2 30 04/15/2021 0423   GLUCOSE 157 (H) 04/15/2021 0423   BUN 9 04/15/2021 0423   CREATININE 0.30 (L) 04/15/2021 0423   CREATININE 0.67  03/26/2021 1159   CREATININE 0.61 (L) 10/26/2019 1523   CALCIUM 8.4 (L) 04/15/2021 0423   PROT 5.5 (L) 04/15/2021 0423   ALBUMIN 2.4 (L) 04/15/2021 0423   AST 12 (L) 04/15/2021 0423   AST 12 (L) 03/26/2021 1159   ALT 10 04/15/2021 0423   ALT 12 03/26/2021 1159   ALT 53 (H) 09/06/2019 1543   ALKPHOS 46 04/15/2021 0423   BILITOT 0.4 04/15/2021 0423   BILITOT <0.2 (L) 03/26/2021 1159   GFRNONAA >60 04/15/2021 0423   GFRNONAA >60 03/26/2021 1159   GFRAA >60 01/03/2020 1355   GFRAA >60 05/04/2019 1207   Lipase     Component Value Date/Time   LIPASE 72 (H) 04/12/2021 0147       Studies/Results: No results found.  Anti-infectives: Anti-infectives (From admission, onward)    Start     Dose/Rate Route Frequency Ordered Stop   04/12/21 1200  piperacillin-tazobactam (ZOSYN) IVPB 3.375 g        3.375 g 12.5 mL/hr over 240 Minutes Intravenous Every 8 hours 04/12/21 0920     04/12/21 0600  piperacillin-tazobactam (ZOSYN) IVPB 3.375 g        3.375 g 100 mL/hr over 30 Minutes Intravenous  Once 04/12/21 0549 04/12/21 [id number]        Assessment/Plan Perforated duodenal ulcer - POD 3 s/p ex lap graham patich and G tube exchange by Dr.  Stechshulte 7/29 - Cont NPO - TPN for nutrition - Cont G tube to gravity drain - currently clamped - Plan for UGI on POD 5 (8/3) - will check h pylori  FEN: NPO, TPN ID: [email or web address] 7/28>> VTE: SCDs, okay for chemical prophylaxis from surgical standpoint   SCC of R supraglottis Anemia Alcohol use R middle lobe lung nodule Protein calorie malnutrition  LOS: 3 days    Winferd Humphrey, [facility] 04/15/2021, 11:15 AM Please see Amion for pager number during day hours 7:00am-4:30pm

## 2021-04-15 NOTE — Progress Notes (Signed)
[place] Rm 1404 Manufacturing engineer [facility]) Hospital Liaison note:  This patient is currently enrolled in [facility] outpatient-based Palliative Care. Will continue to follow for disposition.  Please call with any outpatient palliative questions or concerns.  Thank you, Lorelee Market, LPN [facility] Liaison [phone]

## 2021-04-15 NOTE — Progress Notes (Signed)
PHARMACY - TOTAL PARENTERAL NUTRITION CONSULT NOTE   Indication: Duodenal perf s/p repair 7/29  Patient Measurements: Height: '5\' 8"'$  (172.7 cm) Weight: 50.6 kg (111 lb 8.8 oz) IBW/kg (Calculated) : 68.4 TPN AdjBW (KG): 50.6 Body mass index is 16.96 kg/m. Usual Weight:   Assessment:  70 y.o. male w/ laryngeal cancer w/ hx of radiation and currently undergoing chemo. He had g-tube insertion by IR in Jan 2022. PTA has PEG but still eats & drinks- had been putting beer & moonshine into PEG tube per RD note 7/26. He presented the ED on 7/29 with bleeding around g-tube. Abdominal CT on 7/29 showed findings with concern for leak along the gastrostomy tract.  He underwent  emergent exp lap for perforated duodenal ulcer, modified Graham patch of duodenal ulcer, and gastrostomy tube exchange on 7/29.  Pharmacy consulted on 7/29 to start TPN. Glucose / Insulin: no hx DM;  - on sSSI q6h (used 7 units in the past 24 hrs) - CBG (goal <150): 135-184 Electrolytes:  Na low at 134, K 3.7, CL low 97, phos 2.7; other lytes wnl Renal: scr low 0.30 (crcl~64) Hepatic: ALT/AST 12/10, TG 97, PreAlb 2.4 Intake / Output; MIVF:  - I/O: - 84 mL - LR 35 ml/hr GI Imaging:   - 7/29 CT A/P: suggesting leak along the gastrostomy tract.  GI Surgeries / Procedures:  - 7/29 emergent exp lap: perforated duodenal ulcer, modified Graham patch of duodenal ulcer, Gastrostomy tube exchange, drain placed  TPN start date: 04/13/2021  Central access: Has PAC placed 09/24/20 TPN start date: 7/30  Nutritional Goals (per RD recommendation on 7/30): PTA: [place] Std 1.4  325 mls QID & flush w/ 90 ml free water before and after each feed to provide 1820 Kcal & 80 gm protein, 1896 total water  Kcal:  1800-2000kcal/day  Protein:  90-100g/day  Fluid:  1.4-1.6L/day    Goal TPN rate is 65 mL/hr (provides 93g of protein and 1903 kcals per day)    Current Nutrition: NPO, TPN  Plan:   Now:  - potassium phosphate 10 mmol IV  x1 - magnesium sulfate 1gm IV x1  At 1800: - Increase TPN to  56m/hr at 1800 - Electrolytes in TPN: increase Na to 844m/L, K 5075mL, Ca 5mE85m, Mg 5mEq66m and Phos 15mmo62m Cl:Ac 2:1 - Add IV Thiamine '100mg'$  and folic '1mg'$   to TPN  as recom by dietician - Add standard MVI and trace elements to TPN - continue Sensitive q6h SSI and adjust as needed  - decrease MIVF to 20 mL/hr at 1800 - Monitor TPN labs on Mon/Thurs; BMET, phos and mag on 8/2  Jance Siek PhDia SittermD, BCPS 04/15/2021 8:23 AM

## 2021-04-15 NOTE — Progress Notes (Signed)
PT Cancellation Note  Patient Details Name: [name] MRN: [id number] DOB: [birth year]   Cancelled Treatment:    Reason Eval/Treat Not Completed: Patient declined, no reason specified. Pt declines PT despite encouragement, states he has been up moving around the room all morning and just got back in bed. Requests therapy come back tomorrow. Will continue to follow acutely.     Talbot Grumbling PT, DPT 04/15/21, 1:33 PM

## 2021-04-15 NOTE — TOC Initial Note (Signed)
Transition of Care [facility]) - Initial/Assessment Note    Patient Details  Name: [name] MRN: [id number] Date of Birth: Dec 25, 1950  Transition of Care [facility]) CM/SW Contact:    Dessa Phi, RN Phone Number: 04/15/2021, 3:54 PM  Clinical Narrative:  Spoke to sister [name] about d/c plans-retun back home w/HHC-will check on agency to accept for HHPT/HHRN. Patient was going to [place] to manage PEG tube PTA.Will have [facility] to follow-TPN currentlyHas own transport home.               Expected Discharge Plan: [place] Barriers to Discharge: Continued Medical Work up   Patient Goals and CMS Choice Patient states their goals for this hospitalization and ongoing recovery are:: go home CMS Medicare.gov Compare Post Acute Care list provided to:: Patient Represenative (must comment) (sister Daisy [phone]) Choice offered to / list presented to : Sibling  Expected Discharge Plan and Services Expected Discharge Plan: [place]   Discharge Planning Services: CM Consult Post Acute Care Choice: [place] arrangements for the past 2 months: Single Family Home                                      Prior Living Arrangements/Services Living arrangements for the past 2 months: Single Family Home Lives with:: Siblings Patient language and need for interpreter reviewed:: Yes Do you feel safe going back to the place where you live?: Yes      Need for Family Participation in Patient Care: No (Comment) Care giver support system in place?: Yes (comment) Current home services: DME ([place] was managing) Criminal Activity/Legal Involvement Pertinent to Current Situation/Hospitalization: No - Comment as needed  Activities of Daily Living Home Assistive Devices/Equipment: Dentures (specify type), Eyeglasses ADL Screening (condition at time of admission) Patient's cognitive ability adequate to safely complete daily  activities?: Yes Is the patient deaf or have difficulty hearing?: No Does the patient have difficulty seeing, even when wearing glasses/contacts?: Yes Does the patient have difficulty concentrating, remembering, or making decisions?: No Patient able to express need for assistance with ADLs?: Yes Does the patient have difficulty dressing or bathing?: Yes Independently performs ADLs?: No Communication: Independent Dressing (OT): Needs assistance Grooming: Needs assistance Is this a change from baseline?: Pre-admission baseline Feeding: Needs assistance Is this a change from baseline?: Pre-admission baseline Bathing: Needs assistance Toileting: Needs assistance Is this a change from baseline?: Pre-admission baseline In/Out Bed: Needs assistance Is this a change from baseline?: Pre-admission baseline Walks in Home: Needs assistance Is this a change from baseline?: Pre-admission baseline Does the patient have difficulty walking or climbing stairs?: Yes Weakness of Legs: Both Weakness of Arms/Hands: None  Permission Sought/Granted Permission sought to share information with : Case Manager Permission granted to share information with : Yes, Verbal Permission Granted  Share Information with NAME: Case Manager     Permission granted to share info w Relationship: Daisy sister [phone]     Emotional Assessment Appearance:: Appears stated age Attitude/Demeanor/Rapport: Gracious Affect (typically observed): Accepting Orientation: : Oriented to Self, Oriented to Place, Oriented to  Time, Oriented to Situation Alcohol / Substance Use: Not Applicable Psych Involvement: No (comment)  Admission diagnosis:  Vomiting [R11.10] Gastrostomy tube dysfunction (HCC) [K94.23] Leaking percutaneous endoscopic gastrostomy (PEG) tube [facility]) [id number] Patient Active Problem List   Diagnosis Date Noted   Gastrostomy tube dysfunction (  [place]) 04/12/2021   Anemia due to antineoplastic chemotherapy  03/26/2021   Cancer related pain 03/26/2021   Laryngeal edema 03/11/2021   Nausea and vomiting 01/24/2021   Shortness of breath 01/10/2021   Mucositis due to antineoplastic therapy 01/10/2021   G tube feedings ([place]) 01/10/2021   Chemotherapy-induced diarrhea 10/30/2020   Port-A-Cath in place 10/23/2020   Cancer of tonsillar fossa ([place]) 08/31/2020   Laryngeal cancer ([place]) 01/06/2020   Metastatic cancer to cervical lymph nodes ([place]) 12/01/2019   Chronic hepatitis C without hepatic coma ([place]) 06/09/2019   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

## 2021-04-16 ENCOUNTER — Inpatient Hospital Stay: Payer: Medicare HMO

## 2021-04-16 ENCOUNTER — Inpatient Hospital Stay: Payer: Medicare HMO | Admitting: Hematology and Oncology

## 2021-04-16 ENCOUNTER — Other Ambulatory Visit: Payer: Medicare HMO

## 2021-04-16 ENCOUNTER — Inpatient Hospital Stay: Payer: Medicare HMO | Admitting: Dietician

## 2021-04-16 DIAGNOSIS — T451X5A Adverse effect of antineoplastic and immunosuppressive drugs, initial encounter: Secondary | ICD-10-CM | POA: Diagnosis not present

## 2021-04-16 DIAGNOSIS — Z95828 Presence of other vascular implants and grafts: Secondary | ICD-10-CM | POA: Diagnosis not present

## 2021-04-16 DIAGNOSIS — D6481 Anemia due to antineoplastic chemotherapy: Secondary | ICD-10-CM | POA: Diagnosis not present

## 2021-04-16 DIAGNOSIS — K9423 Gastrostomy malfunction: Secondary | ICD-10-CM | POA: Diagnosis not present

## 2021-04-16 LAB — GLUCOSE, CAPILLARY
Glucose-Capillary: 134 mg/dL — ABNORMAL HIGH (ref 70–99)
Glucose-Capillary: 151 mg/dL — ABNORMAL HIGH (ref 70–99)
Glucose-Capillary: 169 mg/dL — ABNORMAL HIGH (ref 70–99)
Glucose-Capillary: 196 mg/dL — ABNORMAL HIGH (ref 70–99)

## 2021-04-16 LAB — CBC
HCT: 25.4 % — ABNORMAL LOW (ref 39.0–52.0)
Hemoglobin: 8.2 g/dL — ABNORMAL LOW (ref 13.0–17.0)
MCH: 31.4 pg (ref 26.0–34.0)
MCHC: 32.3 g/dL (ref 30.0–36.0)
MCV: 97.3 fL (ref 80.0–100.0)
Platelets: 258 10*3/uL (ref 150–400)
RBC: 2.61 MIL/uL — ABNORMAL LOW (ref 4.22–5.81)
RDW: 14.8 % (ref 11.5–15.5)
WBC: 10.5 10*3/uL (ref 4.0–10.5)
nRBC: 0 % (ref 0.0–0.2)

## 2021-04-16 LAB — BASIC METABOLIC PANEL
Anion gap: 10 (ref 5–15)
BUN: 9 mg/dL (ref 8–23)
CO2: 30 mmol/L (ref 22–32)
Calcium: 8.8 mg/dL — ABNORMAL LOW (ref 8.9–10.3)
Chloride: 98 mmol/L (ref 98–111)
Creatinine, Ser: 0.42 mg/dL — ABNORMAL LOW (ref 0.61–1.24)
GFR, Estimated: >60 mL/min (ref >60)
Glucose, Bld: 146 mg/dL — ABNORMAL HIGH (ref 70–99)
Potassium: 3.9 mmol/L (ref 3.5–5.1)
Sodium: 138 mmol/L (ref 135–145)

## 2021-04-16 LAB — MAGNESIUM: Magnesium: 1.9 mg/dL (ref 1.7–2.4)

## 2021-04-16 LAB — PHOSPHORUS: Phosphorus: 3.2 mg/dL (ref 2.5–4.6)

## 2021-04-16 MED ORDER — METHOCARBAMOL 1000 MG/10ML IJ SOLN
1000.0000 mg | Freq: Four times a day (QID) | INTRAVENOUS | Status: DC | PRN
  Filled 2021-04-16: qty 10

## 2021-04-16 MED ORDER — ACETAMINOPHEN 10 MG/ML IV SOLN
1000.0000 mg | Freq: Four times a day (QID) | INTRAVENOUS | Status: AC
  Administered 2021-04-16 – 2021-04-17 (×4): 1000 mg via INTRAVENOUS
  Filled 2021-04-16 (×4): qty 100

## 2021-04-16 MED ORDER — TRACE MINERALS CU-MN-SE-ZN 300-55-60-3000 MCG/ML IV SOLN
INTRAVENOUS | Status: AC
  Filled 2021-04-16: qty 624

## 2021-04-16 MED ORDER — LACTATED RINGERS IV SOLN: INTRAVENOUS | Status: DC

## 2021-04-16 NOTE — Progress Notes (Signed)
[facility]                                                                              Patient Demographics  [name], is a 70 y.o. male, DOB - 01-04-1951, [id number]  Admit date - 04/12/2021   Admitting Physician Alan Patience, MD  Outpatient Primary MD for the patient is Alan Ebbs, MD  Outpatient specialists:   LOS - 4  days   Medical records reviewed and are as summarized below:    Chief Complaint  Patient presents with   Nausea   Blood in Feeding Tube       Brief summary   Patient is a 70 year old male with squamous cell carcinoma of supraglottis s/p radiation therapy, concurrent chemotherapy completed in 10/2020 with was recently admitted for angioedema treated with steroids.  Patient presented to ED with complaints of increasing abdominal pain and bloating around the gastrostomy tube for the last 24 hours PTA.  Also reported vomiting, no diarrhea.  Pain diffusely under the G-tube. 80 CT abdomen showed extravasation of contrast material into the peritoneal cavity of the upper abdomen suggesting leak along the gastrostomy tract Interval summary 7/30-8/2 Patient was admitted with a perforated abdominal ulcer, peritonitis, leaking G-tube, underwent urgent G-tube exchange, Graham patch duodenal ulcer on 7/29 Currently n.p.o., on IV Zosyn, general surgery following.  On TPN.  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[place]) - Follows oncology at [facility], s/p radiotherapy and  concurrent chemotherapy, completed in 10/2020 -  Port-A-Cath in place    Anemia due to antineoplastic chemotherapy -H&H currently stable, 8.2, follow closely and transfuse if less than 7.5  Hypophosphatemia, hypoalbuminemia -Continue TPN  Alcohol use -Stable, CIWA's minimal  1.3 cm right middle lobe nodule -Incidentally seen on CT abdomen, outpatient follow-up by oncology  Generalized debility -PT OT evaluation recommended home health PT  Moderate protein calorie malnutrition, underweight Estimated body mass index is 16.96 kg/m as calculated from the following:   Height as of this encounter: '5\' 8"'$  (1.727 m).   Weight as of this encounter: 50.6 kg. -Continue TPN   Code Status: full  DVT Prophylaxis:  SCDs Start: 04/12/21 0917   Level of Care: Level of care: Telemetry Family Communication: Discussed all imaging results, lab results, explained to the patient and girlfriend at the bedside   Disposition Plan:     Status is: Inpatient  Remains inpatient appropriate because:Inpatient level of care appropriate due to severity of illness  Dispo: The patient is from: Home              Anticipated d/c is to: Home              Patient currently is not medically stable to  d/c.  When cleared by surgery   Difficult to place patient No      Time Spent in minutes   25 minutes  Procedures:  7/29 : gastrostomy tube exchange, modified Graham patch of duodenal ulcer  Consultants:   General surgery  Antimicrobials:   Anti-infectives (From admission, onward)    Start     Dose/Rate Route Frequency Ordered Stop   04/12/21 1200  piperacillin-tazobactam (ZOSYN) IVPB 3.375 g        3.375 g 12.5 mL/hr over 240 Minutes Intravenous Every 8 hours 04/12/21 0920     04/12/21 0600  piperacillin-tazobactam (ZOSYN) IVPB 3.375 g        3.375 g 100 mL/hr over 30 Minutes Intravenous  Once 04/12/21 0549 04/12/21 0621          Medications  Scheduled Meds:  atropine  1 drop Right Eye  BID   Chlorhexidine Gluconate Cloth  6 each Topical Daily   insulin aspart  0-9 Units Subcutaneous Q6H   pantoprazole (PROTONIX) IV  40 mg Intravenous Q12H   prednisoLONE acetate  1 drop Right Eye QID   Continuous Infusions:  lactated ringers 20 mL/hr at 04/16/21 0401   lactated ringers     methocarbamol (ROBAXIN) IV     piperacillin-tazobactam (ZOSYN)  IV 3.375 g (04/16/21 1141)   TPN ADULT (ION) 50 mL/hr at 04/16/21 0400   TPN ADULT (ION)     PRN Meds:.methocarbamol (ROBAXIN) IV, morphine injection, ondansetron (ZOFRAN) IV, sodium chloride flush      Subjective:   [name] was seen and examined today.  No acute complaints this morning, girlfriend at the bedside.  States pain is controlled at this time.  No active nausea or vomiting.  Afebrile this morning.  No acute issues overnight.   Objective:   Vitals:   04/15/21 0413 04/15/21 1227 04/15/21 1955 04/16/21 0554  BP: (!) 142/74 (!) 170/87 136/86 (!) 144/90  Pulse: 69 76 72 84  Resp: '13 18 17 17  '$ Temp: 98.3 F (36.8 C) 99 F (37.2 C) 98.4 F (36.9 C) 98.7 F (37.1 C)  TempSrc: Oral Oral Oral Oral  SpO2: 100% 100% 100% 100%  Weight:      Height:        Intake/Output Summary (Last 24 hours) at 04/16/2021 1209 Last data filed at 04/16/2021 0800 Gross per 24 hour  Intake 1347.74 ml  Output 3645 ml  Net -2297.26 ml     Wt Readings from Last 3 Encounters:  04/12/21 50.6 kg  03/26/21 50.6 kg  03/14/21 49.8 kg   Physical Exam General: Alert and oriented x 3, NAD Cardiovascular: S1 S2 clear, RRR. No pedal edema b/l Respiratory: CTAB, Gastrointestinal: Soft, mild TTP, JP drain, G-tube Ext: no pedal edema bilaterally Neuro: no new deficits Skin: No rashes Psych: Normal affect and demeanor, alert and oriented x3    Data Reviewed:  I have personally reviewed following labs and imaging studies  Micro Results Recent Results (from the past 240 hour(s))  Resp Panel by RT-PCR (Flu A&B, Covid) Nasopharyngeal Swab      Status: None   Collection Time: 04/12/21  5:46 AM   Specimen: Nasopharyngeal Swab; Nasopharyngeal(NP) swabs in vial transport medium  Result Value Ref Range Status   SARS Coronavirus 2 by RT PCR NEGATIVE NEGATIVE Final    Comment: (NOTE) SARS-CoV-2 target nucleic acids are NOT DETECTED.  The SARS-CoV-2 RNA is generally detectable in upper respiratory specimens during the acute phase of infection. The lowest concentration of  SARS-CoV-2 viral copies this assay can detect is 138 copies/mL. A negative result does not preclude SARS-Cov-2 infection and should not be used as the sole basis for treatment or other patient management decisions. A negative result may occur with  improper specimen collection/handling, submission of specimen other than nasopharyngeal swab, presence of viral mutation(s) within the areas targeted by this assay, and inadequate number of viral copies(<138 copies/mL). A negative result must be combined with clinical observations, patient history, and epidemiological information. The expected result is Negative.  Fact Sheet for Patients:  EntrepreneurPulse.com.au  Fact Sheet for Healthcare Providers:  IncredibleEmployment.be  This test is no t yet approved or cleared by the [place] FDA and  has been authorized for detection and/or diagnosis of SARS-CoV-2 by FDA under an Emergency Use Authorization (EUA). This EUA will remain  in effect (meaning this test can be used) for the duration of the COVID-19 declaration under Section 564(b)(1) of the Act, 21 U.S.C.section 360bbb-3(b)(1), unless the authorization is terminated  or revoked sooner.       Influenza A by PCR NEGATIVE NEGATIVE Final   Influenza B by PCR NEGATIVE NEGATIVE Final    Comment: (NOTE) The Xpert Xpress SARS-CoV-2/FLU/RSV plus assay is intended as an aid in the diagnosis of influenza from Nasopharyngeal swab specimens and should not be used as a sole  basis for treatment. Nasal washings and aspirates are unacceptable for Xpert Xpress SARS-CoV-2/FLU/RSV testing.  Fact Sheet for Patients: EntrepreneurPulse.com.au  Fact Sheet for Healthcare Providers: IncredibleEmployment.be  This test is not yet approved or cleared by the [place] FDA and has been authorized for detection and/or diagnosis of SARS-CoV-2 by FDA under an Emergency Use Authorization (EUA). This EUA will remain in effect (meaning this test can be used) for the duration of the COVID-19 declaration under Section 564(b)(1) of the Act, 21 U.S.C. section 360bbb-3(b)(1), unless the authorization is terminated or revoked.  Performed at [facility], [place] [address], [place], [place] [address]   Culture, blood (routine x 2)     Status: None (Preliminary result)   Collection Time: 04/15/21  2:33 PM   Specimen: BLOOD  Result Value Ref Range Status   Specimen Description   Final    BLOOD BLOOD RIGHT FOREARM Performed at [facility] [address], [place], [place] [address]    Special Requests   Final    BOTTLES DRAWN AEROBIC AND ANAEROBIC Blood Culture adequate volume Performed at [facility] [address] [place], [place] [address]    Culture   Final    NO GROWTH < 24 HOURS Performed at [facility] [address], [place], [place] [address]    Report Status PENDING  Incomplete  Culture, blood (routine x 2)     Status: None (Preliminary result)   Collection Time: 04/15/21  2:36 PM   Specimen: BLOOD  Result Value Ref Range Status   Specimen Description   Final    BLOOD BLOOD LEFT WRIST Performed at [place] [facility] [address], [place], [place] [address]    Special Requests   Final    BOTTLES DRAWN AEROBIC AND ANAEROBIC Blood Culture adequate volume Performed at [facility] [address], [place], [place] [address]     Culture   Final    NO GROWTH < 24 HOURS Performed at [facility] [address] [place], [place] [address]    Report Status PENDING  Incomplete  Radiology Reports DG Chest 1 View  Result Date: 04/12/2021 CLINICAL DATA:  History of throat cancer with abdominal pain. EXAM: CHEST  1 VIEW COMPARISON:  January 10, 2021 FINDINGS: There is stable left-sided venous Port-A-Cath positioning. The heart size and mediastinal contours are within normal limits. Both lungs are clear. Radiopaque contrast is seen within the gastric lumen. The visualized skeletal structures are unremarkable. IMPRESSION: No active disease. Electronically Signed   By: Virgina Norfolk M.D.   On: 04/12/2021 03:35   CT ABDOMEN PELVIS W CONTRAST  Result Date: 04/12/2021 CLINICAL DATA:  Nausea, vomiting, blood in gastrostomy catheter EXAM: CT ABDOMEN AND PELVIS WITH CONTRAST TECHNIQUE: Multidetector CT imaging of the abdomen and pelvis was performed using the standard protocol following bolus administration of intravenous contrast. CONTRAST:  65m OMNIPAQUE IOHEXOL 350 MG/ML SOLN COMPARISON:  PET-CT 01/31/2021, and previous FINDINGS: Lower chest: No pleural or pericardial effusion. 1.3 cm pleural-based nodule in the right middle lobe, new since previous. Hepatobiliary: No focal liver abnormality is seen. No gallstones, gallbladder wall thickening, or biliary dilatation. Pancreas: Unremarkable. No pancreatic ductal dilatation or surrounding inflammatory changes. Spleen: Normal in size without focal abnormality. Adrenals/Urinary Tract: Adrenal glands unremarkable. 1 cm probable cyst, mid left kidney. No hydronephrosis. Contrast material in the renal collecting systems and nondistended urinary bladder. Stomach/Bowel: The gastrostomy tube projects in expected location. There is dense contrast in the partially distended stomach. Small bowel is decompressed, with incomplete distal passage of oral contrast material. The colon is  decompressed, unremarkable. Vascular/Lymphatic: Scattered calcified aortoiliac plaque without aneurysm or evident stenosis. Portal vein patent. No abdominal or pelvic adenopathy. Reproductive: Prostate is unremarkable. Other: There is a moderate amount of high attenuation fluid in the upper and anterior abdomen suggesting leak of oral contrast material, presumably from along the gastrostomy tract although there is no definite hyperdense leak adjacent to the gastrostomy catheter. Small volume fluid-attenuation pelvic ascites without evident loculation. No free air. Musculoskeletal: Mild L3 compression deformity, stable since 11/10/2019. Multilevel lumbar spondylitic change. No acute fracture or worrisome bone lesion. IMPRESSION: 1. Contrast material in the peritoneal cavity of the upper abdomen suggesting leak along the gastrostomy tract. Critical Value/emergent results were called by telephone at the time of interpretation on 04/12/2021 at 5:38 am to [facility], who verbally acknowledged these results. 2. Gastrostomy tube is appropriately positioned. No evidence of abscess. 3. 1.3 cm right middle lobe nodule, new since 01/31/2021. Favor infectious/inflammatory over primary/secondary neoplasm given the relatively short time frame of development. Electronically Signed   By: DLucrezia EuropeM.D.   On: 04/12/2021 05:41   IR Cm Inj Any Colonic Tube W/Fluoro  Result Date: 03/20/2021 INDICATION: 70year old gentleman with gastrostomy tube returns to interventional radiology with leaking around gastrostomy tube. EXAM: Fluoroscopy guided repositioning of gastrostomy tube MEDICATIONS: None ANESTHESIA/SEDATION: None CONTRAST:  5 mL of Omnipaque 350-administered into the gastric lumen. FLUOROSCOPY TIME:  Fluoroscopy Time: 0.4 minutes (0.7 mGy). COMPLICATIONS: None immediate. PROCEDURE: Informed written consent was obtained from the patient after a thorough discussion of the procedural risks, benefits and alternatives.  All questions were addressed. Maximal Sterile Barrier Technique was utilized including caps, mask, sterile gowns, sterile gloves, sterile drape, hand hygiene and skin antiseptic. A timeout was performed prior to the initiation of the procedure. Contrast administered through the gastrostomy tube showed appropriate positioning within the lumen. There was slack between the bumper and mushroom of the gastrostomy tube. The slack was reduced as this is likely responsible for patient's leakage. IMPRESSION: Leakage of gastrostomy  tube likely secondary to incomplete apposition of retention disc is against anterior abdominal wall. External disc was cinched against the patient's skin, which should decreased the leakage. The patient was taught how to adjust the disc if leakage again occurs. Some skin breakdown was noted around the G-tube insertion site. Patient was seen by stoma nurse who recommended OTC Desitin application to the area. Electronically Signed   By: Miachel Roux M.D.   On: 03/20/2021 16:17   DG ABDOMEN PEG TUBE LOCATION  Result Date: 04/12/2021 CLINICAL DATA:  Peg tube location. EXAM: ABDOMEN - 1 VIEW COMPARISON:  None. FINDINGS: A percutaneous gastrostomy tube is seen with its distal tip and insufflator bulb overlying left upper quadrant. Following injection of 30 mL of Omnipaque into the peg tube, radiopaque contrast is seen throughout the gastric lumen. No contrast extravasation is identified. The bowel gas pattern is normal. No radio-opaque calculi are seen. IMPRESSION: Percutaneous gastrostomy tube positioned within the body of the stomach. Electronically Signed   By: Virgina Norfolk M.D.   On: 04/12/2021 02:38   [place] EKG SITE RITE  Result Date: 04/12/2021 If Site Rite image not attached, placement could not be confirmed due to current cardiac rhythm.   Lab Data:  CBC: Recent Labs  Lab 04/12/21 0147 04/13/21 0355 04/14/21 0552 04/15/21 0423 04/16/21 0422  WBC 21.4* 25.0* 20.9* 13.8* 10.5   NEUTROABS 19.6* 22.2*  --  12.2*  --   HGB 9.9* 8.4* 7.6* 7.7* 8.2*  HCT 30.5* 25.9* 23.7* 23.3* 25.4*  MCV 96.2 97.0 98.8 96.3 97.3  PLT 381 292 244 238 0000000   Basic Metabolic Panel: Recent Labs  Lab 04/12/21 0147 04/13/21 0355 04/14/21 0552 04/15/21 0423 04/16/21 0422  NA 134* 135 136 134* 138  K 3.6 3.5 3.7 3.7 3.9  CL 92* 100 102 97* 98  CO2 '26 27 27 30 30  '$ GLUCOSE 120* 132* 170* 157* 146*  BUN '16 14 12 9 9  '$ CREATININE 0.48* 0.54* 0.45* 0.30* 0.42*  CALCIUM 9.1 8.3* 8.6* 8.4* 8.8*  MG  --  1.8 1.8 1.9 1.9  PHOS  --  2.8 1.7* 2.7 3.2   GFR: Estimated Creatinine Clearance: 64.1 mL/min (A) (by C-G formula based on SCr of 0.42 mg/dL (L)). Liver Function Tests: Recent Labs  Lab 04/12/21 0147 04/13/21 0355 04/15/21 0423  AST 14* 15 12*  ALT '12 11 10  '$ ALKPHOS 57 43 46  BILITOT 1.5* 1.0 0.4  PROT 6.7 5.5* 5.5*  ALBUMIN 3.4* 2.8* 2.4*   Recent Labs  Lab 04/12/21 0147  LIPASE 72*   No results for input(s): AMMONIA in the last 168 hours. Coagulation Profile: No results for input(s): INR, PROTIME in the last 168 hours. Cardiac Enzymes: No results for input(s): CKTOTAL, CKMB, CKMBINDEX, TROPONINI in the last 168 hours. BNP (last 3 results) No results for input(s): PROBNP in the last 8760 hours. HbA1C: No results for input(s): HGBA1C in the last 72 hours. CBG: Recent Labs  Lab 04/15/21 1157 04/15/21 1905 04/15/21 2351 04/16/21 0555 04/16/21 1150  GLUCAP 139* 152* 135* 169* 151*   Lipid Profile: Recent Labs    04/15/21 0423  TRIG 97   Thyroid Function Tests: No results for input(s): TSH, T4TOTAL, FREET4, T3FREE, THYROIDAB in the last 72 hours. Anemia Panel: No results for input(s): VITAMINB12, FOLATE, FERRITIN, TIBC, IRON, RETICCTPCT in the last 72 hours. Urine analysis:    Component Value Date/Time   COLORURINE STRAW (A) 04/12/2021 [place] 04/12/2021 0420  LABSPEC 1.004 (L) 04/12/2021 0420   PHURINE 6.0 04/12/2021 0420    GLUCOSEU NEGATIVE 04/12/2021 0420   HGBUR NEGATIVE 04/12/2021 0420   BILIRUBINUR NEGATIVE 04/12/2021 0420   KETONESUR 20 (A) 04/12/2021 0420   PROTEINUR NEGATIVE 04/12/2021 0420   UROBILINOGEN 4.0 (H) 10/13/2007 1940   NITRITE NEGATIVE 04/12/2021 0420   LEUKOCYTESUR NEGATIVE 04/12/2021 0420     Sehaj Mcenroe M.D. [facility] 04/16/2021, 12:09 PM  Available via Epic secure chat 7am-7pm After 7 pm, please refer to night coverage provider listed on amion.

## 2021-04-16 NOTE — Progress Notes (Signed)
Physical Therapy Treatment Patient Details Name: [name] MRN: [id number] DOB: 1951-02-03 Today's Date: 04/16/2021    History of Present Illness 70 yo male admitted with G tube dysfunction. S/P G tube exchange,duodenal ulcer repair 04/12/21. Hx of throat ca, ETOH abuse    PT Comments    Patient progressing well with mobility. Ambulated ~360' with min guard and use of IV pole, plan to progress gait training with no UE support next session; pt declined trial this session. Encouraged pt to sit up in recliner and educated on benefits of upright position to reduce secretions. VSS throughout mobility with HR in 110's during gait and 80's at rest. Acute PT will continue to progress as able.    Follow Up Recommendations  Home health PT;Supervision - Intermittent     Equipment Recommendations  None recommended by PT    Recommendations for Other Services       Precautions / Restrictions Precautions Precautions: Fall Precaution Comments: R jp drain, L g tube Restrictions Weight Bearing Restrictions: No    Mobility  Bed Mobility Overal bed mobility: Needs Assistance Bed Mobility: Supine to Sit     Supine to sit: Supervision;HOB elevated     General bed mobility comments: Supervision for safety and lines    Transfers Overall transfer level: Needs assistance Equipment used: None   Sit to Stand: Supervision         General transfer comment: Supervision for safety and lines. pt steady in standing.  Ambulation/Gait Ambulation/Gait assistance: Min guard Gait Distance (Feet): 360 Feet Assistive device: IV Pole Gait Pattern/deviations: Step-through pattern;Decreased stride length Gait velocity: decr   General Gait Details: min assist for IV at start but pt progressed quickly to min guard for safety. encouraged pt to mobilize without support of IV but pt preferred to push it himself. no overt LOB noted.   Stairs             Wheelchair Mobility    Modified  Rankin (Stroke Patients Only)       Balance Overall balance assessment: Needs assistance Sitting-balance support: Feet supported Sitting balance-Leahy Scale: Good     Standing balance support: Single extremity supported;During functional activity Standing balance-Leahy Scale: Fair                              Cognition Arousal/Alertness: Awake/alert Behavior During Therapy: WFL for tasks assessed/performed Overall Cognitive Status: Within Functional Limits for tasks assessed                                 General Comments: pt not much of a conversation      Exercises      General Comments        Pertinent Vitals/Pain Pain Assessment: Faces Faces Pain Scale: No hurt Pain Intervention(s): Monitored during session    Home Living                      Prior Function            PT Goals (current goals can now be found in the care plan section) Acute Rehab PT Goals Patient Stated Goal: none stated PT Goal Formulation: With patient Time For Goal Achievement: 04/27/21 Potential to Achieve Goals: Good Progress towards PT goals: Progressing toward goals    Frequency    Min 3X/week      PT Plan  Current plan remains appropriate    Co-evaluation              AM-PAC PT "6 Clicks" Mobility   Outcome Measure  Help needed turning from your back to your side while in a flat bed without using bedrails?: None Help needed moving from lying on your back to sitting on the side of a flat bed without using bedrails?: A Little Help needed moving to and from a bed to a chair (including a wheelchair)?: A Little Help needed standing up from a chair using your arms (e.g., wheelchair or bedside chair)?: A Little Help needed to walk in hospital room?: A Little Help needed climbing 3-5 steps with a railing? : A Little 6 Click Score: 19    End of Session Equipment Utilized During Treatment: Gait belt Activity Tolerance: Patient  tolerated treatment well Patient left: in chair;with call bell/phone within reach;with family/visitor present;with nursing/sitter in room Nurse Communication: Mobility status PT Visit Diagnosis: Muscle weakness (generalized) (M62.81)     Time: [id number] PT Time Calculation (min) (ACUTE ONLY): 21 min  Charges:  $Gait Training: 8-22 mins                     [name], DPT Acute Rehabilitation Services Office [phone] Pager [phone]    [name] 04/16/2021, 12:01 PM

## 2021-04-16 NOTE — Progress Notes (Signed)
PHARMACY - TOTAL PARENTERAL NUTRITION CONSULT NOTE   Indication: Duodenal perf s/p repair 7/29  Patient Measurements: Height: '5\' 8"'$  (172.7 cm) Weight: 50.6 kg (111 lb 8.8 oz) IBW/kg (Calculated) : 68.4 TPN AdjBW (KG): 50.6 Body mass index is 16.96 kg/m. Usual Weight:   Assessment:  70 y.o. male w/ laryngeal cancer w/ hx of radiation and currently undergoing chemo. He had g-tube insertion by IR in Jan 2022. PTA has PEG but still eats & drinks- had been putting beer & moonshine into PEG tube per RD note 7/26. He presented the ED on 7/29 with bleeding around g-tube. Abdominal CT on 7/29 showed findings with concern for leak along the gastrostomy tract.  He underwent  emergent exp lap for perforated duodenal ulcer, modified Graham patch of duodenal ulcer, and gastrostomy tube exchange on 7/29.  Pharmacy consulted on 7/29 to start TPN. Glucose / Insulin: no hx DM;  - on sSSI q6h (used 5 units since TPN rate increased to 50 ml/hr)) - CBG (goal <150): 135-169 Electrolytes:  Na improved to 138 with Na content increased in TPN;  other lytes wnl Renal: scr low (crcl~64) Hepatic: ALT/AST 12/10, TG 97 (8/1), PreAlb 2.4 (8/1) Intake / Output; MIVF:  - I/O: - 2444 mL - LR 20 ml/hr GI Imaging:   - 7/29 CT A/P: suggesting leak along the gastrostomy tract.  GI Surgeries / Procedures:  - 7/29 emergent exp lap: perforated duodenal ulcer, modified Graham patch of duodenal ulcer, Gastrostomy tube exchange, drain placed  TPN start date: 04/13/2021  Central access: Has PAC placed 09/24/20 TPN start date: 7/30  Nutritional Goals (per RD recommendation on 7/30): PTA: [place] Std 1.4  325 mls QID & flush w/ 90 ml free water before and after each feed to provide 1820 Kcal & 80 gm protein, 1896 total water  Kcal:  1800-2000kcal/day  Protein:  90-100g/day  Fluid:  1.4-1.6L/day    Goal TPN rate is 65 mL/hr (provides 93g of protein and 1903 kcals per day)    Current Nutrition: NPO, TPN  Plan:   At  1800: - Increase TPN to goal rate of 65 mL/hr at 1800 - Electrolytes in TPN: continue Na at 44mq/L, K 550m/L, Ca 37m28mL, Mg 37mE18m, and Phos 137mm96m. Cl:Ac 2:1 - Add IV Thiamine '100mg'$  and folic '1mg'$   to TPN  as recom by dietician - Add standard MVI and trace elements to TPN - continue Sensitive q6h SSI and adjust as needed  - decrease MIVF to 10 mL/hr at 1800 - Monitor TPN labs on Mon/Thurs; BMET, phos and mag on 8/3  Rhianon Zabawa PDia SitterrmD, BCPS 04/16/2021 7:04 AM

## 2021-04-16 NOTE — Progress Notes (Signed)
Nutrition  Scheduled to see patient in infusion for  nutrition follow-up today. Chart reviewed. Patient is currently admitted to hospital. Appointment rescheduled, will plan to see patient at next scheduled infusion on Tuesday, August 23.

## 2021-04-16 NOTE — Progress Notes (Signed)
Progress Note  4 Days Post-Op  Subjective: Patient reports abdominal pain. Denies nausea or vomiting. G-tube is to gravity bag. Denies flatus or BM. His brother was at bedside and I updated him as well.   Objective: Vital signs in last 24 hours: Temp:  [98.4 F (36.9 C)-99 F (37.2 C)] 98.7 F (37.1 C) (08/02 0554) Pulse Rate:  [72-84] 84 (08/02 0554) Resp:  [17-18] 17 (08/02 0554) BP: (136-170)/(86-90) 144/90 (08/02 0554) SpO2:  [100 %] 100 % (08/02 0554) Last BM Date: 04/10/21  Intake/Output from previous day: 08/01 0701 - 08/02 0700 In: 2195.8 [I.V.:1814.3; IV Piggyback:381.6] Out: [id number] [Urine:4640; Drains:30] Intake/Output this shift: Total I/O In: -  Out: 350 [Urine:350]  PE: General: pleasant, male who is sitting up in chair in NAD HEENT: head is normocephalic, atraumatic.   Heart: regular, rate, and rhythm.  Palpable radial pulses bilaterally Lungs: Respiratory effort nonlabored on room air Abd: soft, ND, +BS, mild TTP around midline incision which is covered with honeycomb dressing - visible staples intact without erythema or discharge. JP drain with serous output. Gtube to gravity  MSK: all 4 extremities are symmetrical with no cyanosis, clubbing, or edema. No calf TTP Skin: warm and dry with no masses, lesions, or rashes Psych: A&Ox3 with an appropriate affect.   Lab Results:  Recent Labs    04/15/21 0423 04/16/21 0422  WBC 13.8* 10.5  HGB 7.7* 8.2*  HCT 23.3* 25.4*  PLT 238 258   BMET Recent Labs    04/15/21 0423 04/16/21 0422  NA 134* 138  K 3.7 3.9  CL 97* 98  CO2 30 30  GLUCOSE 157* 146*  BUN 9 9  CREATININE 0.30* 0.42*  CALCIUM 8.4* 8.8*   PT/INR No results for input(s): LABPROT, INR in the last 72 hours. CMP     Component Value Date/Time   NA 138 04/16/2021 0422   K 3.9 04/16/2021 0422   CL 98 04/16/2021 0422   CO2 30 04/16/2021 0422   GLUCOSE 146 (H) 04/16/2021 0422   BUN 9 04/16/2021 0422   CREATININE 0.42 (L) 04/16/2021  0422   CREATININE 0.67 03/26/2021 1159   CREATININE 0.61 (L) 10/26/2019 1523   CALCIUM 8.8 (L) 04/16/2021 0422   PROT 5.5 (L) 04/15/2021 0423   ALBUMIN 2.4 (L) 04/15/2021 0423   AST 12 (L) 04/15/2021 0423   AST 12 (L) 03/26/2021 1159   ALT 10 04/15/2021 0423   ALT 12 03/26/2021 1159   ALT 53 (H) 09/06/2019 1543   ALKPHOS 46 04/15/2021 0423   BILITOT 0.4 04/15/2021 0423   BILITOT <0.2 (L) 03/26/2021 1159   GFRNONAA >60 04/16/2021 0422   GFRNONAA >60 03/26/2021 1159   GFRAA >60 01/03/2020 1355   GFRAA >60 05/04/2019 1207   Lipase     Component Value Date/Time   LIPASE 72 (H) 04/12/2021 0147       Studies/Results: No results found.  Anti-infectives: Anti-infectives (From admission, onward)    Start     Dose/Rate Route Frequency Ordered Stop   04/12/21 1200  piperacillin-tazobactam (ZOSYN) IVPB 3.375 g        3.375 g 12.5 mL/hr over 240 Minutes Intravenous Every 8 hours 04/12/21 0920     04/12/21 0600  piperacillin-tazobactam (ZOSYN) IVPB 3.375 g        3.375 g 100 mL/hr over 30 Minutes Intravenous  Once 04/12/21 0549 04/12/21 [id number]        Assessment/Plan Perforated duodenal ulcer POD4 s/p ex lap graham patich and G  tube exchange by Dr. Lanny Hurst 7/29 - Cont NPO - TPN for nutrition - Cont G tube to gravity  - Plan for UGI on POD 5 (8/3) - have ordered for tomorrow AM - will check h pylori   FEN: NPO, TPN ID: [id number] 7/28>> VTE: SCDs, okay for chemical prophylaxis from surgical standpoint   SCC of R supraglottis Anemia Alcohol use R middle lobe lung nodule Protein calorie malnutrition  LOS: 4 days    Norm Parcel, [facility] 04/16/2021, 12:16 PM Please see Amion for pager number during day hours 7:00am-4:30pm

## 2021-04-16 NOTE — TOC Progression Note (Signed)
Transition of Care [facility]) - Progression Note    Patient Details  Name: [name] MRN: [id number] Date of Birth: [birth year]  [facility] of [facility] CM/SW Contact  Ronell Duffus, Juliann Pulse, RN Phone Number: 04/16/2021, 9:09 AM  Clinical Narrative:  [facility] accepted case for HHRN/PT rep Lattie Haw following. [employer] rep Pam will assess for TPN if needed.    Expected Discharge Plan: [place] Barriers to Discharge: Continued Medical Work up  Expected Discharge Plan and Services Expected Discharge Plan: [place]   Discharge Planning Services: CM Consult Post Acute Care Choice: [place] arrangements for the past 2 months: Single Family Home                           HH Arranged: RN, PT [facility] Agency: [facility] Date [facility] Agency Contacted: 04/16/21 Time [place]: [phone] Representative spoke with at [place]: [place] ([place]) Interventions    Readmission Risk Interventions No flowsheet data found.

## 2021-04-17 ENCOUNTER — Inpatient Hospital Stay (HOSPITAL_COMMUNITY): Payer: Medicare HMO

## 2021-04-17 DIAGNOSIS — K251 Acute gastric ulcer with perforation: Secondary | ICD-10-CM

## 2021-04-17 DIAGNOSIS — Z95828 Presence of other vascular implants and grafts: Secondary | ICD-10-CM | POA: Diagnosis not present

## 2021-04-17 DIAGNOSIS — E43 Unspecified severe protein-calorie malnutrition: Secondary | ICD-10-CM | POA: Insufficient documentation

## 2021-04-17 DIAGNOSIS — C321 Malignant neoplasm of supraglottis: Secondary | ICD-10-CM

## 2021-04-17 DIAGNOSIS — K255 Chronic or unspecified gastric ulcer with perforation: Secondary | ICD-10-CM

## 2021-04-17 DIAGNOSIS — D6481 Anemia due to antineoplastic chemotherapy: Secondary | ICD-10-CM | POA: Diagnosis not present

## 2021-04-17 DIAGNOSIS — K9423 Gastrostomy malfunction: Secondary | ICD-10-CM | POA: Diagnosis not present

## 2021-04-17 LAB — GLUCOSE, CAPILLARY
Glucose-Capillary: 151 mg/dL — ABNORMAL HIGH (ref 70–99)
Glucose-Capillary: 158 mg/dL — ABNORMAL HIGH (ref 70–99)
Glucose-Capillary: 161 mg/dL — ABNORMAL HIGH (ref 70–99)
Glucose-Capillary: 165 mg/dL — ABNORMAL HIGH (ref 70–99)
Glucose-Capillary: 168 mg/dL — ABNORMAL HIGH (ref 70–99)

## 2021-04-17 LAB — BASIC METABOLIC PANEL
Anion gap: 7 (ref 5–15)
BUN: 12 mg/dL (ref 8–23)
CO2: 27 mmol/L (ref 22–32)
Calcium: 8.5 mg/dL — ABNORMAL LOW (ref 8.9–10.3)
Chloride: 101 mmol/L (ref 98–111)
Creatinine, Ser: 0.37 mg/dL — ABNORMAL LOW (ref 0.61–1.24)
GFR, Estimated: >60 mL/min (ref >60)
Glucose, Bld: 160 mg/dL — ABNORMAL HIGH (ref 70–99)
Potassium: 4.1 mmol/L (ref 3.5–5.1)
Sodium: 135 mmol/L (ref 135–145)

## 2021-04-17 LAB — MAGNESIUM: Magnesium: 1.7 mg/dL (ref 1.7–2.4)

## 2021-04-17 LAB — PHOSPHORUS: Phosphorus: 2.9 mg/dL (ref 2.5–4.6)

## 2021-04-17 MED ORDER — HYDROMORPHONE HCL 1 MG/ML IJ SOLN
1.0000 mg | INTRAMUSCULAR | Status: DC | PRN
  Administered 2021-04-17 – 2021-04-18 (×5): 1 mg via INTRAVENOUS
  Administered 2021-04-18 (×2): 2 mg via INTRAVENOUS
  Administered 2021-04-19: 1 mg via INTRAVENOUS
  Administered 2021-04-19 – 2021-04-20 (×3): 2 mg via INTRAVENOUS
  Administered 2021-04-20: 1 mg via INTRAVENOUS
  Administered 2021-04-20: 2 mg via INTRAVENOUS
  Administered 2021-04-20: 1 mg via INTRAVENOUS
  Administered 2021-04-21 – 2021-04-22 (×6): 2 mg via INTRAVENOUS
  Administered 2021-04-22: 1 mg via INTRAVENOUS
  Filled 2021-04-17 (×5): qty 2
  Filled 2021-04-17: qty 1
  Filled 2021-04-17: qty 2
  Filled 2021-04-17 (×2): qty 1
  Filled 2021-04-17 (×2): qty 2
  Filled 2021-04-17: qty 1
  Filled 2021-04-17 (×3): qty 2
  Filled 2021-04-17: qty 1
  Filled 2021-04-17 (×2): qty 2
  Filled 2021-04-17 (×3): qty 1

## 2021-04-17 MED ORDER — ACETAMINOPHEN 160 MG/5ML PO SOLN: 650.0000 mg | Freq: Four times a day (QID) | ORAL | Status: DC

## 2021-04-17 MED ORDER — OXYCODONE HCL 5 MG/5ML PO SOLN
5.0000 mg | ORAL | Status: DC | PRN
Start: 2021-04-17 — End: 2021-04-22
  Administered 2021-04-17 – 2021-04-22 (×4): 5 mg
  Filled 2021-04-17 (×4): qty 5

## 2021-04-17 MED ORDER — ACETAMINOPHEN 160 MG/5ML PO SOLN
650.0000 mg | Freq: Four times a day (QID) | ORAL | Status: DC
  Administered 2021-04-18 – 2021-04-22 (×17): 650 mg
  Filled 2021-04-17 (×18): qty 20.3

## 2021-04-17 MED ORDER — HYDROMORPHONE HCL 1 MG/ML IJ SOLN: 1.0000 mg | INTRAMUSCULAR | Status: DC | PRN

## 2021-04-17 MED ORDER — MAGNESIUM SULFATE 2 GM/50ML IV SOLN
2.0000 g | Freq: Once | INTRAVENOUS | Status: AC
  Administered 2021-04-17: 2 g via INTRAVENOUS
  Filled 2021-04-17: qty 50

## 2021-04-17 MED ORDER — HYDROMORPHONE HCL 1 MG/ML IJ SOLN
1.0000 mg | INTRAMUSCULAR | Status: DC | PRN
  Administered 2021-04-17 (×2): 1 mg via INTRAVENOUS
  Filled 2021-04-17 (×2): qty 1

## 2021-04-17 MED ORDER — TRACE MINERALS CU-MN-SE-ZN 300-55-60-3000 MCG/ML IV SOLN
INTRAVENOUS | Status: AC
  Filled 2021-04-17: qty 624

## 2021-04-17 MED ORDER — OSMOLITE 1.2 CAL PO LIQD: 1000.0000 mL | ORAL | Status: DC

## 2021-04-17 MED ORDER — OSMOLITE 1.5 CAL PO LIQD
1000.0000 mL | ORAL | Status: DC
  Administered 2021-04-18: 1000 mL
  Filled 2021-04-17 (×3): qty 1000

## 2021-04-17 NOTE — Plan of Care (Signed)
  Problem: Clinical Measurements: Goal: Ability to maintain clinical measurements within normal limits will improve Outcome: Progressing Goal: Will remain free from infection Outcome: Progressing   Problem: Activity: Goal: Risk for activity intolerance will decrease Outcome: Progressing   Problem: Skin Integrity: Goal: Risk for impaired skin integrity will decrease Outcome: Progressing

## 2021-04-17 NOTE — Progress Notes (Signed)
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  Patient presented to ED with complaints of increasing abdominal pain and bloating around the gastrostomy tube for the last 24 hours PTA.  Also reported vomiting, no diarrhea.  Pain diffusely under the G-tube. 80 CT abdomen showed extravasation of contrast material into the peritoneal cavity of the upper abdomen suggesting leak along the gastrostomy tract Interval summary 7/30-8/2 Patient was admitted with a perforated abdominal ulcer, peritonitis, leaking G-tube, underwent urgent G-tube exchange, Graham patch duodenal ulcer on 7/29 Currently n.p.o., was on IV Zosyn, general surgery following.  On TPN.   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 -Patient with complaints of abdominal pain states pain medication not helping. -Patient for upper GI series today to determine if leak noted and went to start tube feeds. -Continue G-tube to gravity. -Continue TPN. -Place on IV PPI -Change IV morphine to IV Dilaudid as needed for better pain control. -Per general surgery.  2.  Squamous cell carcinoma of right supraglottis -Follows with oncology at [facility]. -Status post radiotherapy and concurrent chemotherapy completed February 2022. -Port-A-Cath in place. -Outpatient follow-up with oncology.  3.  Anemia due to antineoplastic chemotherapy -H&H stable at 8.2. -Transfusion threshold hemoglobin < 7.  4.  Hypophosphatemia/hypoalbuminemia/ -TPN per pharmacy.  5.  Alcohol use -Stable. -Follow.  6.  1.3 cm right middle lobe nodule -Incidentally noted on CT -Outpatient follow-up with oncology.  7.  Severe protein calorie malnutrition, underweight -TPN.  8.  Generalized debility -Seen by PT/OT who are recommending home health therapies.    DVT prophylaxis: SCDs Code Status: Full Family Communication: Updated patient.  No family at bedside. Disposition:   Status is: Inpatient  Remains inpatient appropriate because:Inpatient level of care appropriate due to severity of illness  Dispo: The patient is from: Home              Anticipated d/c is to: Home              Patient currently is not medically stable to d/c.   Difficult to place patient No       Consultants:  General surgery: Dr.Stechschulte 04/12/2021  Procedures:  CT abdomen and pelvis 04/12/2021 Upper GI series pending 04/17/2021 Chest x-ray 04/12/2021 Abdominal film PEG tube location 04/12/2021 Modified Graham patch of duodenal ulcer/gastrostomy tube exchange per general surgery: Dr.Stechschulte 04/12/2021   Antimicrobials:  IV Zosyn 7/29 /2022x1 dose.   Subjective: Patient complaining of significant abdominal pain.  No nausea or emesis.  No chest pain.  No shortness of breath.  Objective: Vitals:   04/16/21 1920 04/17/21 0612 04/17/21 1259 04/17/21 1636  BP: (!) 137/98 136/82 132/86 (!) 136/94  Pulse: 85 73 79 85  Resp: '18  17 20  '$ Temp: 98.6  F (37 C) 98.5 F (36.9 C) 98.8 F (37.1 C) 99.1 F (37.3 C)  TempSrc: Oral Oral Oral Oral  SpO2: 100% 100% 100% 100%  Weight:      Height:        Intake/Output Summary (Last 24 hours) at 04/17/2021  1836 Last data filed at 04/17/2021 1601 Gross per 24 hour  Intake 1134.61 ml  Output 2110 ml  Net -975.39 ml   Filed Weights   04/12/21 0043  Weight: 50.6 kg    Examination:  General exam: Appears calm and comfortable  Respiratory system: Clear to auscultation anterior lung fields.  No wheezes, no crackles, no rhonchi.Marland Kitchen Respiratory effort normal. Cardiovascular system: S1 & S2 heard, RRR. No JVD, murmurs, rubs, gallops or clicks. No pedal edema. Gastrointestinal system: Abdomen is nondistended, soft and some tenderness to palpation in the epigastrium and lower abdominal region.  Hypoactive bowel sounds.  Honeycomb dressing intact.  PEG tube intact.  Central nervous system: Alert and oriented. No focal neurological deficits. Extremities: Symmetric 5 x 5 power. Skin: No rashes, lesions or ulcers Psychiatry: Judgement and insight appear normal. Mood & affect appropriate.     Data Reviewed: I have personally reviewed following labs and imaging studies  CBC: Recent Labs  Lab 04/12/21 0147 04/13/21 0355 04/14/21 0552 04/15/21 0423 04/16/21 0422  WBC 21.4* 25.0* 20.9* 13.8* 10.5  NEUTROABS 19.6* 22.2*  --  12.2*  --   HGB 9.9* 8.4* 7.6* 7.7* 8.2*  HCT 30.5* 25.9* 23.7* 23.3* 25.4*  MCV 96.2 97.0 98.8 96.3 97.3  PLT 381 292 244 238 0000000    Basic Metabolic Panel: Recent Labs  Lab 04/13/21 0355 04/14/21 0552 04/15/21 0423 04/16/21 0422 04/17/21 0436  NA 135 136 134* 138 135  K 3.5 3.7 3.7 3.9 4.1  CL 100 102 97* 98 101  CO2 '27 27 30 30 27  '$ GLUCOSE 132* 170* 157* 146* 160*  BUN '14 12 9 9 12  '$ CREATININE 0.54* 0.45* 0.30* 0.42* 0.37*  CALCIUM 8.3* 8.6* 8.4* 8.8* 8.5*  MG 1.8 1.8 1.9 1.9 1.7  PHOS 2.8 1.7* 2.7 3.2 2.9    GFR: Estimated Creatinine Clearance: 64.1 mL/min (A) (by C-G formula based on SCr of 0.37 mg/dL (L)).  Liver Function Tests: Recent Labs  Lab 04/12/21 0147 04/13/21 0355 04/15/21 0423  AST 14* 15 12*  ALT '12 11 10  '$ ALKPHOS 57 43 46  BILITOT  1.5* 1.0 0.4  PROT 6.7 5.5* 5.5*  ALBUMIN 3.4* 2.8* 2.4*    CBG: Recent Labs  Lab 04/16/21 1815 04/16/21 2354 04/17/21 0610 04/17/21 0719 04/17/21 1253  GLUCAP 134* 196* 168* 158* 151*     Recent Results (from the past 240 hour(s))  Resp Panel by RT-PCR (Flu A&B, Covid) Nasopharyngeal Swab     Status: None   Collection Time: 04/12/21  5:46 AM   Specimen: Nasopharyngeal Swab; Nasopharyngeal(NP) swabs in vial transport medium  Result Value Ref Range Status   SARS Coronavirus 2 by RT PCR NEGATIVE NEGATIVE Final    Comment: (NOTE) SARS-CoV-2 target nucleic acids are NOT DETECTED.  The SARS-CoV-2 RNA is generally detectable in upper respiratory specimens during the acute phase of infection. The lowest concentration of SARS-CoV-2 viral copies this assay can detect is 138 copies/mL. A negative result does not preclude SARS-Cov-2 infection and should not be used as the sole basis for treatment or other patient management decisions. A negative result may occur with  improper specimen collection/handling, submission of specimen other than nasopharyngeal swab, presence  of viral mutation(s) within the areas targeted by this assay, and inadequate number of viral copies(<138 copies/mL). A negative result must be combined with clinical observations, patient history, and epidemiological information. The expected result is Negative.  Fact Sheet for Patients:  EntrepreneurPulse.com.au  Fact Sheet for Healthcare Providers:  IncredibleEmployment.be  This test is no t yet approved or cleared by the [place] FDA and  has been authorized for detection and/or diagnosis of SARS-CoV-2 by FDA under an Emergency Use Authorization (EUA). This EUA will remain  in effect (meaning this test can be used) for the duration of the COVID-19 declaration under Section 564(b)(1) of the Act, 21 U.S.C.section 360bbb-3(b)(1), unless the authorization is terminated  or  revoked sooner.       Influenza A by PCR NEGATIVE NEGATIVE Final   Influenza B by PCR NEGATIVE NEGATIVE Final    Comment: (NOTE) The Xpert Xpress SARS-CoV-2/FLU/RSV plus assay is intended as an aid in the diagnosis of influenza from Nasopharyngeal swab specimens and should not be used as a sole basis for treatment. Nasal washings and aspirates are unacceptable for Xpert Xpress SARS-CoV-2/FLU/RSV testing.  Fact Sheet for Patients: EntrepreneurPulse.com.au  Fact Sheet for Healthcare Providers: IncredibleEmployment.be  This test is not yet approved or cleared by the [place] FDA and has been authorized for detection and/or diagnosis of SARS-CoV-2 by FDA under an Emergency Use Authorization (EUA). This EUA will remain in effect (meaning this test can be used) for the duration of the COVID-19 declaration under Section 564(b)(1) of the Act, 21 U.S.C. section 360bbb-3(b)(1), unless the authorization is terminated or revoked.  Performed at [facility], [place] [address], [place], [place] [address]   Culture, blood (routine x 2)     Status: None (Preliminary result)   Collection Time: 04/15/21  2:33 PM   Specimen: BLOOD  Result Value Ref Range Status   Specimen Description   Final    BLOOD BLOOD RIGHT FOREARM Performed at [facility] [address], [place], [place] [address]    Special Requests   Final    BOTTLES DRAWN AEROBIC AND ANAEROBIC Blood Culture adequate volume Performed at [facility] [address], [place], [place] [address]    Culture   Final    NO GROWTH 2 DAYS Performed at [facility] [address] [place], [place] [address]    Report Status PENDING  Incomplete  Culture, blood (routine x 2)     Status: None (Preliminary result)   Collection Time: 04/15/21  2:36 PM   Specimen: BLOOD  Result Value Ref Range Status   Specimen Description   Final    BLOOD  BLOOD LEFT WRIST Performed at [facility] [address] [place], [place] [address]    Special Requests   Final    BOTTLES DRAWN AEROBIC AND ANAEROBIC Blood Culture adequate volume Performed at [facility] [address], [place], [place] [address]    Culture   Final    NO GROWTH 2 DAYS Performed at [facility] [address], [place], [place] [address]    Report Status PENDING  Incomplete         Radiology Studies: DG UGI W SINGLE CM (SOL OR THIN BA)  Result Date: 04/17/2021 CLINICAL DATA:  History of Graham patch repair for perforated proximal duodenal ulcer. Postoperative day 5. EXAM: WATER SOLUBLE UPPER GI SERIES TECHNIQUE: Single-column upper GI series was performed using water soluble contrast.  CONTRAST:  Approximately 60 cc water-soluble contrast administered. COMPARISON:  Abdomen pelvis CT 04/12/2021. FLUOROSCOPY TIME:  Fluoroscopy Time:  3 minutes and 7 seconds. Radiation Exposure Index (if provided by the fluoroscopic device): 180.2 mGy Number of Acquired Spot Images: FINDINGS: Patient was evaluated using prone in bilateral oblique positioning with fluoro table in a 30-45 degree head up position. Water-soluble contrast material was a to boost via the existing G-tube. Duodenal bulb has a fixed distorted appearance, likely related to surgery. Gastric emptying is relatively prompt and the duodenum is not dilated. Transit of contrast in the duodenum through the third segment anterior to the spine was somewhat delay, likely due to positioning. Contrast material did migrate past the ligament of Treitz. Gastric emptying was observed for approximately 10 minutes in there is no evidence for contrast extravasation from the proximal duodenum to suggest leak. No evidence for contrast material entering the surgical drain. IMPRESSION: 1. No demonstrable leak from the proximal duodenum. The duodenal bulb is distorted and fixed in appearance,  compatible with recent surgery. The second and third portions of the duodenum are nondilated and demonstrate normal peristaltic activity. 2. No evidence for contrast material entering the surgical drain. Electronically Signed   By: Misty Stanley M.D.   On: 04/17/2021 13:11        Scheduled Meds:  acetaminophen (TYLENOL) oral liquid 160 mg/5 mL  650 mg Per Tube QID   atropine  1 drop Right Eye BID   Chlorhexidine Gluconate Cloth  6 each Topical Daily   feeding supplement (OSMOLITE 1.5 CAL)  1,000 mL Per Tube Q24H   insulin aspart  0-9 Units Subcutaneous Q6H   pantoprazole (PROTONIX) IV  40 mg Intravenous Q12H   prednisoLONE acetate  1 drop Right Eye QID   Continuous Infusions:  lactated ringers 10 mL/hr at 04/16/21 1851   methocarbamol (ROBAXIN) IV     TPN ADULT (ION) 65 mL/hr at 04/17/21 1748     LOS: 5 days    Time spent: 35 minutes    Irine Seal, MD [facility]   To contact the attending provider between 7A-7P or the covering provider during after hours 7P-7A, please log into the web site [email or web address] and access using universal [place] password for that web site. If you do not have the password, please call the hospital operator.  04/17/2021, 6:36 PM

## 2021-04-17 NOTE — Progress Notes (Signed)
Progress Note  5 Days Post-Op  Subjective: Patient reports improving pain control. Discussed plans for UGI today with patient and family member at bedside.   Objective: Vital signs in last 24 hours: Temp:  [98.5 F (36.9 C)-98.6 F (37 C)] 98.5 F (36.9 C) (08/03 0612) Pulse Rate:  [73-85] 73 (08/03 0612) Resp:  [16-18] 18 (08/02 1920) BP: (125-137)/(82-98) 136/82 (08/03 0612) SpO2:  [100 %] 100 % (08/03 0612) Last BM Date: 05/11/21  Intake/Output from previous day: 08/02 0701 - 08/03 0700 In: 1134.6 [I.V.:636.7; IV Piggyback:468] Out: [id number] [Urine:1775; Drains:60] Intake/Output this shift: No intake/output data recorded.  PE: General: pleasant, male who is sitting up in chair in NAD HEENT: head is normocephalic, atraumatic.   Heart: regular, rate, and rhythm.  Palpable radial pulses bilaterally Lungs: Respiratory effort nonlabored on room air Abd: soft, ND, mild TTP around midline incision which is covered with honeycomb dressing - visible staples intact without erythema or discharge. JP drain with serous output. Gtube to gravity  MSK: all 4 extremities are symmetrical with no cyanosis, clubbing, or edema. No calf TTP Skin: warm and dry with no masses, lesions, or rashes Psych: A&Ox3 with an appropriate affect.   Lab Results:  Recent Labs    04/15/21 0423 04/16/21 0422  WBC 13.8* 10.5  HGB 7.7* 8.2*  HCT 23.3* 25.4*  PLT 238 258   BMET Recent Labs    04/16/21 0422 04/17/21 0436  NA 138 135  K 3.9 4.1  CL 98 101  CO2 30 27  GLUCOSE 146* 160*  BUN 9 12  CREATININE 0.42* 0.37*  CALCIUM 8.8* 8.5*   PT/INR No results for input(s): LABPROT, INR in the last 72 hours. CMP     Component Value Date/Time   NA 135 04/17/2021 0436   K 4.1 04/17/2021 0436   CL 101 04/17/2021 0436   CO2 27 04/17/2021 0436   GLUCOSE 160 (H) 04/17/2021 0436   BUN 12 04/17/2021 0436   CREATININE 0.37 (L) 04/17/2021 0436   CREATININE 0.67 03/26/2021 1159   CREATININE 0.61 (L)  10/26/2019 1523   CALCIUM 8.5 (L) 04/17/2021 0436   PROT 5.5 (L) 04/15/2021 0423   ALBUMIN 2.4 (L) 04/15/2021 0423   AST 12 (L) 04/15/2021 0423   AST 12 (L) 03/26/2021 1159   ALT 10 04/15/2021 0423   ALT 12 03/26/2021 1159   ALT 53 (H) 09/06/2019 1543   ALKPHOS 46 04/15/2021 0423   BILITOT 0.4 04/15/2021 0423   BILITOT <0.2 (L) 03/26/2021 1159   GFRNONAA >60 04/17/2021 0436   GFRNONAA >60 03/26/2021 1159   GFRAA >60 01/03/2020 1355   GFRAA >60 05/04/2019 1207   Lipase     Component Value Date/Time   LIPASE 72 (H) 04/12/2021 0147       Studies/Results: No results found.  Anti-infectives: Anti-infectives (From admission, onward)    Start     Dose/Rate Route Frequency Ordered Stop   04/12/21 1200  piperacillin-tazobactam (ZOSYN) IVPB 3.375 g        3.375 g 12.5 mL/hr over 240 Minutes Intravenous Every 8 hours 04/12/21 0920     04/12/21 0600  piperacillin-tazobactam (ZOSYN) IVPB 3.375 g        3.375 g 100 mL/hr over 30 Minutes Intravenous  Once 04/12/21 0549 04/12/21 [id number]        Assessment/Plan Perforated duodenal ulcer POD5 s/p ex lap graham patich and G tube exchange by Dr. Lanny Hurst 7/29 - Cont NPO - TPN for nutrition - Cont G  tube to gravity - UGI today - if negative for leak, will start trickle TF and give meds via G-tube. If there is a leak will need G-tube placed back to gravity and continue TPN and repeat study in a few days.  - H. Pylori pending    FEN: NPO, TPN ID: zosyn 7/28>> VTE: SCDs, okay for chemical prophylaxis from surgical standpoint   SCC of R supraglottis Anemia Alcohol use Tobacco abuse - advised cessation R middle lobe lung nodule Severe Protein calorie malnutrition - prealbumin <5 8/1  LOS: 5 days    Norm Parcel, [facility] 04/17/2021, 9:32 AM Please see Amion for pager number during day hours 7:00am-4:30pm

## 2021-04-17 NOTE — Progress Notes (Addendum)
Nutrition Follow-up  DOCUMENTATION CODES:   Severe malnutrition in context of chronic illness, Underweight  INTERVENTION:   -Trickle feeds of Osmolite 1.5 @ 20 ml/hr via G-tube  -TPN management per Pharmacy  -Goal rate will be Osmolite 1.5 @ 60 ml/hr via G-tube.  NEW NUTRITION DIAGNOSIS:   Severe Malnutrition related to chronic illness, cancer and cancer related treatments as evidenced by severe fat depletion, severe muscle depletion, percent weight loss.  GOAL:   Patient will meet greater than or equal to 90% of their needs  MONITOR:   Labs, Weight trends, TF tolerance, I & O's  REASON FOR ASSESSMENT:   Consult Enteral/tube feeding initiation and management  ASSESSMENT:   70 y.o. male with a h/o metastatic laryngeal SCC s/p radiation/chemo, hep C, etoh abuse, s/p IR G-tube 1/10 (still uses etoh via G-tube) and HTN who is admitted with perforated duodenal ulcer and peritonitis now s/p ex lap with graham patch repair and replacement of 33F G-tube 7/29  7/29: s/p ex lap, perforated duodenal ulcer, modified Graham patch of duodenal ulcer, G-tube exchange, drain placed  RD consulted by surgery to start trickle feeds since UGI came back without leaks present.   Spoke with pt at bedside. States he used Osmolite at home and not [employer] as previously mentioned in notes. Was ok with plan of starting Osmolite 1.5 via G-tube today at trickle rate of 20 ml/hr. (Provides 720 kcals and 30g protein daily).  Reports at home he was administering bolus feeds of 1.5 cartons 3-4 times daily. States he felt really full after feedings and he had some instances of N/V.  TPN continues at goal rate of 65 ml/hr, providing 1903 kcals and 93g protein.   Medications: IV Mg sulfate  Labs reviewed:  CBGs: 151-196  NUTRITION - FOCUSED PHYSICAL EXAM:  Flowsheet Row Most Recent Value  Orbital Region Severe depletion  Upper Arm Region Severe depletion  Thoracic and Lumbar Region Unable to  assess  Buccal Region Severe depletion  Temple Region Severe depletion  Clavicle Bone Region Severe depletion  Clavicle and Acromion Bone Region Severe depletion  Scapular Bone Region Severe depletion  Dorsal Hand Severe depletion  Patellar Region Unable to assess  Anterior Thigh Region Unable to assess  Posterior Calf Region Unable to assess  Edema (RD Assessment) None  Hair Unable to assess  [no hair]  Eyes Reviewed  Mouth Reviewed  [edentulous]       Diet Order:   Diet Order             Diet NPO time specified  Diet effective now                   EDUCATION NEEDS:   Education needs have been addressed  Skin:  Skin Assessment: Skin Integrity Issues: Skin Integrity Issues:: Incisions Incisions: 7/29 abdomen  Last BM:  8/1 -type 2  Height:   Ht Readings from Last 1 Encounters:  04/12/21 '5\' 8"'$  (1.727 m)    Weight:   Wt Readings from Last 1 Encounters:  04/12/21 50.6 kg    Ideal Body Weight:  70 kg  BMI:  Body mass index is 16.96 kg/m.  Estimated Nutritional Needs:   Kcal:  1800-2000kcal/day  Protein:  90-100g/day  Fluid:  1.4-1.6L/day   Clayton Bibles, MS, RD, LDN Inpatient Clinical Dietitian Contact information available via Amion

## 2021-04-17 NOTE — Progress Notes (Addendum)
The patient complained of leakage from PEG tube. Upon assessment the tube is leaking from the distal end portion near the connection port used for feeding. Tube feeding will be placed on hold. CCS on call contacted and v.o received to hold TF tonight for CCS to reassess in the am. Hospitalist updated also.

## 2021-04-17 NOTE — Progress Notes (Signed)
PHARMACY - TOTAL PARENTERAL NUTRITION CONSULT NOTE   Indication: Duodenal perf s/p repair 7/29  Patient Measurements: Height: '5\' 8"'$  (172.7 cm) Weight: 50.6 kg (111 lb 8.8 oz) IBW/kg (Calculated) : 68.4 TPN AdjBW (KG): 50.6 Body mass index is 16.96 kg/m. Usual Weight:   Assessment:  70 y.o. male w/ laryngeal cancer w/ hx of radiation and currently undergoing chemo. He had g-tube insertion by IR in Jan 2022. PTA has PEG but still eats & drinks- had been putting beer & moonshine into PEG tube per RD note 7/26. He presented the ED on 7/29 with bleeding around g-tube. Abdominal CT on 7/29 showed findings with concern for leak along the gastrostomy tract.  He underwent  emergent exp lap for perforated duodenal ulcer, modified Graham patch of duodenal ulcer, and gastrostomy tube exchange on 7/29.  Pharmacy consulted on 7/29 to start TPN.  Glucose / Insulin: no hx DM;  - on sSSI q6h (used 5 units in 12 hrs since TPN rate increased to 65 ml/hr)) - CBG (goal <150): 134-196 Electrolytes:  Na down 135, mag 1.7;  other lytes wnl Renal: scr low (crcl~64) Hepatic: ALT/AST 12/10 on 8/1, TG 97 (8/1), PreAlb 2.4 (8/1) Intake / Output; MIVF:  - I/O: - 700 mL - LR at 10 ml/hr GI Imaging:   - 7/29 CT A/P: suggesting leak along the gastrostomy tract.  GI Surgeries / Procedures:  - 7/29 emergent exp lap: perforated duodenal ulcer, modified Graham patch of duodenal ulcer, Gastrostomy tube exchange, drain placed  TPN start date: 04/13/2021  Central access: Has PAC placed 09/24/20 TPN start date: 7/30  Nutritional Goals (per RD recommendation on 7/30): PTA: [place] Std 1.4  325 mls QID & flush w/ 90 ml free water before and after each feed to provide 1820 Kcal & 80 gm protein, 1896 total water  Kcal:  1800-2000kcal/day  Protein:  90-100g/day  Fluid:  1.4-1.6L/day    Goal TPN rate is 65 mL/hr (provides 93g of protein and 1903 kcals per day)    Current Nutrition: NPO, TPN  Plan:   Now: -  magnesium sulfate 2 gm IV x1  At 1800: - continue TPN at goal rate of 65 mL/hr - Electrolytes in TPN: continue Na at 80 mEq/L (will increase content if Na continues to trend down on 8/4), K 369mq/L, Ca 551m/L, Mg 69m6mL, and Phos 169m64mL. Cl:Ac 1:1 - Add IV Thiamine '100mg'$  and folic '1mg'$   to TPN  as recom by dietician - Add standard MVI and trace elements to TPN - continue Sensitive q6h SSI and adjust as needed  - continue MIVF to 10 mL/hr at 1800 - Monitor TPN labs on Mon/Thurs  Yazleen Molock Dia SitterarmD, BCPS 04/17/2021 7:00 AM

## 2021-04-18 ENCOUNTER — Inpatient Hospital Stay: Payer: Medicare HMO

## 2021-04-18 DIAGNOSIS — K9423 Gastrostomy malfunction: Secondary | ICD-10-CM | POA: Diagnosis not present

## 2021-04-18 DIAGNOSIS — Z95828 Presence of other vascular implants and grafts: Secondary | ICD-10-CM | POA: Diagnosis not present

## 2021-04-18 DIAGNOSIS — D6481 Anemia due to antineoplastic chemotherapy: Secondary | ICD-10-CM | POA: Diagnosis not present

## 2021-04-18 DIAGNOSIS — K251 Acute gastric ulcer with perforation: Secondary | ICD-10-CM | POA: Diagnosis not present

## 2021-04-18 LAB — CBC WITH DIFFERENTIAL/PLATELET
Abs Immature Granulocytes: 0.34 10*3/uL — ABNORMAL HIGH (ref 0.00–0.07)
Basophils Absolute: 0.1 10*3/uL (ref 0.0–0.1)
Basophils Relative: 1 %
Eosinophils Absolute: 0.2 10*3/uL (ref 0.0–0.5)
Eosinophils Relative: 1 %
HCT: 26.1 % — ABNORMAL LOW (ref 39.0–52.0)
Hemoglobin: 8.2 g/dL — ABNORMAL LOW (ref 13.0–17.0)
Immature Granulocytes: 2 %
Lymphocytes Relative: 5 %
Lymphs Abs: 0.7 10*3/uL (ref 0.7–4.0)
MCH: 30.7 pg (ref 26.0–34.0)
MCHC: 31.4 g/dL (ref 30.0–36.0)
MCV: 97.8 fL (ref 80.0–100.0)
Monocytes Absolute: 1.2 10*3/uL — ABNORMAL HIGH (ref 0.1–1.0)
Monocytes Relative: 8 %
Neutro Abs: 11.8 10*3/uL — ABNORMAL HIGH (ref 1.7–7.7)
Neutrophils Relative %: 83 %
Platelets: 288 10*3/uL (ref 150–400)
RBC: 2.67 MIL/uL — ABNORMAL LOW (ref 4.22–5.81)
RDW: 14.9 % (ref 11.5–15.5)
WBC: 14.3 10*3/uL — ABNORMAL HIGH (ref 4.0–10.5)
nRBC: 0 % (ref 0.0–0.2)

## 2021-04-18 LAB — GLUCOSE, CAPILLARY
Glucose-Capillary: 159 mg/dL — ABNORMAL HIGH (ref 70–99)
Glucose-Capillary: 170 mg/dL — ABNORMAL HIGH (ref 70–99)
Glucose-Capillary: 120 mg/dL — ABNORMAL HIGH (ref 70–99)
Glucose-Capillary: 167 mg/dL — ABNORMAL HIGH (ref 70–99)
Glucose-Capillary: 161 mg/dL — ABNORMAL HIGH (ref 70–99)
Glucose-Capillary: 160 mg/dL — ABNORMAL HIGH (ref 70–99)

## 2021-04-18 LAB — COMPREHENSIVE METABOLIC PANEL
ALT: 13 U/L (ref 0–44)
AST: 12 U/L — ABNORMAL LOW (ref 15–41)
Albumin: 2.5 g/dL — ABNORMAL LOW (ref 3.5–5.0)
Alkaline Phosphatase: 71 U/L (ref 38–126)
Anion gap: 8 (ref 5–15)
BUN: 13 mg/dL (ref 8–23)
CO2: 28 mmol/L (ref 22–32)
Calcium: 8.5 mg/dL — ABNORMAL LOW (ref 8.9–10.3)
Chloride: 100 mmol/L (ref 98–111)
Creatinine, Ser: 0.34 mg/dL — ABNORMAL LOW (ref 0.61–1.24)
GFR, Estimated: >60 mL/min (ref >60)
Glucose, Bld: 165 mg/dL — ABNORMAL HIGH (ref 70–99)
Potassium: 4.3 mmol/L (ref 3.5–5.1)
Sodium: 136 mmol/L (ref 135–145)
Total Bilirubin: 0.4 mg/dL (ref 0.3–1.2)
Total Protein: 6.3 g/dL — ABNORMAL LOW (ref 6.5–8.1)

## 2021-04-18 LAB — PHOSPHORUS: Phosphorus: 2.8 mg/dL (ref 2.5–4.6)

## 2021-04-18 LAB — MAGNESIUM: Magnesium: 1.7 mg/dL (ref 1.7–2.4)

## 2021-04-18 LAB — H. PYLORI ANTIBODY, IGG: H Pylori IgG: 1.85 Index Value — ABNORMAL HIGH (ref 0.00–0.79)

## 2021-04-18 MED ORDER — MAGNESIUM SULFATE 4 GM/100ML IV SOLN
4.0000 g | Freq: Once | INTRAVENOUS | Status: AC
  Administered 2021-04-18: 4 g via INTRAVENOUS
  Filled 2021-04-18: qty 100

## 2021-04-18 MED ORDER — M.V.I. ADULT IV INJ
INJECTION | INTRAVENOUS | Status: AC
  Filled 2021-04-18: qty 624

## 2021-04-18 MED ORDER — ENOXAPARIN SODIUM 30 MG/0.3ML IJ SOSY
30.0000 mg | PREFILLED_SYRINGE | INTRAMUSCULAR | Status: DC
  Administered 2021-04-18 – 2021-04-22 (×5): 30 mg via SUBCUTANEOUS
  Filled 2021-04-18 (×5): qty 0.3

## 2021-04-18 MED ORDER — HYDROMORPHONE HCL 1 MG/ML IJ SOLN
0.5000 mg | Freq: Once | INTRAMUSCULAR | Status: AC
  Administered 2021-04-18: 0.5 mg via INTRAVENOUS
  Filled 2021-04-18: qty 0.5

## 2021-04-18 NOTE — Progress Notes (Signed)
Progress Note  6 Days Post-Op  Subjective: Patient reports minimal abdominal pain.  RN report from overnight was g-tube was leaking from connection site, but not seen by myself or RN on day shift.  No nausea.  Objective: Vital signs in last 24 hours: Temp:  [98.6 F (37 C)-99.1 F (37.3 C)] 98.6 F (37 C) (08/04 1101) Pulse Rate:  [72-87] 72 (08/04 1101) Resp:  [17-20] 18 (08/04 1101) BP: (115-139)/(86-94) 115/88 (08/04 1101) SpO2:  [100 %] 100 % (08/04 1101) Weight:  [47.5 kg] 47.5 kg (08/04 0457) Last BM Date: 04/10/21 (pt unsure)  Intake/Output from previous day: 08/03 0701 - 08/04 0700 In: 659.8 [I.V.:659.8] Out: 2120 [Urine:1975; Drains:145] Intake/Output this shift: Total I/O In: -  Out: 115 [Urine:115]  PE: Abd: soft, appropriately tender, +BS, g-tube in place with no leaking noted.  Attachment site well secured and closed.  JP drain with serous output   Lab Results:  Recent Labs    04/16/21 0422 04/18/21 0501  WBC 10.5 14.3*  HGB 8.2* 8.2*  HCT 25.4* 26.1*  PLT 258 288   BMET Recent Labs    04/17/21 0436 04/18/21 0501  NA 135 136  K 4.1 4.3  CL 101 100  CO2 27 28  GLUCOSE 160* 165*  BUN 12 13  CREATININE 0.37* 0.34*  CALCIUM 8.5* 8.5*   PT/INR No results for input(s): LABPROT, INR in the last 72 hours. CMP     Component Value Date/Time   NA 136 04/18/2021 0501   K 4.3 04/18/2021 0501   CL 100 04/18/2021 0501   CO2 28 04/18/2021 0501   GLUCOSE 165 (H) 04/18/2021 0501   BUN 13 04/18/2021 0501   CREATININE 0.34 (L) 04/18/2021 0501   CREATININE 0.67 03/26/2021 1159   CREATININE 0.61 (L) 10/26/2019 1523   CALCIUM 8.5 (L) 04/18/2021 0501   PROT 6.3 (L) 04/18/2021 0501   ALBUMIN 2.5 (L) 04/18/2021 0501   AST 12 (L) 04/18/2021 0501   AST 12 (L) 03/26/2021 1159   ALT 13 04/18/2021 0501   ALT 12 03/26/2021 1159   ALT 53 (H) 09/06/2019 1543   ALKPHOS 71 04/18/2021 0501   BILITOT 0.4 04/18/2021 0501   BILITOT <0.2 (L) 03/26/2021 1159    GFRNONAA >60 04/18/2021 0501   GFRNONAA >60 03/26/2021 1159   GFRAA >60 01/03/2020 1355   GFRAA >60 05/04/2019 1207   Lipase     Component Value Date/Time   LIPASE 72 (H) 04/12/2021 0147       Studies/Results: DG UGI W SINGLE CM (SOL OR THIN BA)  Result Date: 04/17/2021 CLINICAL DATA:  History of Graham patch repair for perforated proximal duodenal ulcer. Postoperative day 5. EXAM: WATER SOLUBLE UPPER GI SERIES TECHNIQUE: Single-column upper GI series was performed using water soluble contrast. CONTRAST:  Approximately 60 cc water-soluble contrast administered. COMPARISON:  Abdomen pelvis CT 04/12/2021. FLUOROSCOPY TIME:  Fluoroscopy Time:  3 minutes and 7 seconds. Radiation Exposure Index (if provided by the fluoroscopic device): 180.2 mGy Number of Acquired Spot Images: FINDINGS: Patient was evaluated using prone in bilateral oblique positioning with fluoro table in a 30-45 degree head up position. Water-soluble contrast material was a to boost via the existing G-tube. Duodenal bulb has a fixed distorted appearance, likely related to surgery. Gastric emptying is relatively prompt and the duodenum is not dilated. Transit of contrast in the duodenum through the third segment anterior to the spine was somewhat delay, likely due to positioning. Contrast material did migrate past the ligament  of Treitz. Gastric emptying was observed for approximately 10 minutes in there is no evidence for contrast extravasation from the proximal duodenum to suggest leak. No evidence for contrast material entering the surgical drain. IMPRESSION: 1. No demonstrable leak from the proximal duodenum. The duodenal bulb is distorted and fixed in appearance, compatible with recent surgery. The second and third portions of the duodenum are nondilated and demonstrate normal peristaltic activity. 2. No evidence for contrast material entering the surgical drain. Electronically Signed   By: Misty Stanley M.D.   On: 04/17/2021 13:11     Anti-infectives: Anti-infectives (From admission, onward)    Start     Dose/Rate Route Frequency Ordered Stop   04/12/21 1200  piperacillin-tazobactam (ZOSYN) IVPB 3.375 g  Status:  Discontinued        3.375 g 12.5 mL/hr over 240 Minutes Intravenous Every 8 hours 04/12/21 0920 04/17/21 1453   04/12/21 0600  piperacillin-tazobactam (ZOSYN) IVPB 3.375 g        3.375 g 100 mL/hr over 30 Minutes Intravenous  Once 04/12/21 0549 04/12/21 [id number]        Assessment/Plan POD 6, s/p ex lap graham patich and G tube exchange by Dr. Lanny Hurst 7/29 for perforated duodenal ulcer - start TFs at 20cc/hr today and see how he does - TPN for nutrition, will wean as adv TFs - UGI today, negative - H. Pylori pending    FEN: NPO, TPN, TFs at 20cc/hr ID: [email or web address] 7/28>> VTE: SCDs, Lovenox   SCC of R supraglottis Anemia Alcohol use Tobacco abuse - advised cessation R middle lobe lung nodule Severe Protein calorie malnutrition - prealbumin <5 8/1  LOS: 6 days    Henreitta Cea, [facility] Surgery 04/18/2021, 11:52 AM Please see Amion for pager number during day hours 7:00am-4:30pm

## 2021-04-18 NOTE — Progress Notes (Signed)
PHARMACY - TOTAL PARENTERAL NUTRITION CONSULT NOTE   Indication: Duodenal perf s/p repair 7/29  Patient Measurements: Height: '5\' 8"'$  (172.7 cm) Weight: 47.5 kg (104 lb 11.5 oz) IBW/kg (Calculated) : 68.4 TPN AdjBW (KG): 50.6 Body mass index is 15.92 kg/m. Usual Weight:   Assessment:  70 y.o. male w/ laryngeal cancer w/ hx of radiation and currently undergoing chemo. He had g-tube insertion by IR in Jan 2022. PTA has PEG but still eats & drinks- had been putting beer & moonshine into PEG tube per RD note 7/26. He presented the ED on 7/29 with bleeding around g-tube. Abdominal CT on 7/29 showed findings with concern for leak along the gastrostomy tract.  He underwent  emergent exp lap for perforated duodenal ulcer, modified Graham patch of duodenal ulcer, and gastrostomy tube exchange on 7/29.  Pharmacy consulted on 7/29 to start TPN.  Glucose / Insulin: no hx DM  - on sSSI q6h - 8 units used last 24hr - CBG (goal <150) - 120-170 Electrolytes:  Na 136, mag 1.7 (Md ordered mag 4g);  other lytes wnl Renal: scr low (crcl~64) Hepatic: ALT/AST 12/13,  TG 97 (8/1), PreAlb 2.4 (8/1) Intake / Output; MIVF:  - I/O: - 700 mL - LR at 10 ml/hr GI Imaging:   - 7/29 CT A/P: suggesting leak along the gastrostomy tract.  GI Surgeries / Procedures:  - 7/29 emergent exp lap: perforated duodenal ulcer, modified Graham patch of duodenal ulcer, Gastrostomy tube exchange, drain placed  TPN start date: 04/13/2021  Central access: Has PAC placed 09/24/20 TPN start date: 7/30  Nutritional Goals (per RD recommendation on 7/30): PTA: [place] Std 1.4  325 mls QID & flush w/ 90 ml free water before and after each feed to provide 1820 Kcal & 80 gm protein, 1896 total water  Kcal:  1800-2000kcal/day  Protein:  90-100g/day  Fluid:  1.4-1.6L/day    Goal TPN rate is 65 mL/hr (provides 93g of protein and 1903 kcals per day)    Current Nutrition: TPN, TFs restarted 8/3 PM but RN note says that PEG tube was  leaking so TFs were stopped until further notice and have not been restarted yet as of this AM  Plan:   Now: - magnesium sulfate 4g IV x1 ordered per Md this AM  At 1800: - continue TPN at goal rate of 65 mL/hr - Electrolytes in TPN: continue Na at 80 mEq/L, K 35mq/L, Ca 565m/L, Mg 4m55mL (incr), and Phos 29m50mL. Cl:Ac 1:1 - Add IV Thiamine '100mg'$  and folic '1mg'$   to TPN  as recom by dietician - Add standard MVI and trace elements to TPN - continue Sensitive q6h SSI and adjust as needed  - continue MIVF to 10 mL/hr at 1800 - Monitor TPN labs on Mon/Thurs  JustAdrian SaranarmD, BCPS Secure Chat if ?s 04/18/2021 10:04 AM

## 2021-04-18 NOTE — Progress Notes (Signed)
Nutrition Brief Note  Page received from RN regarding pt's free water flush recommendations. RN to provide 100 ml/hr every 6 hours (800 ml). This should be sufficient at this time.  Per chart review, G-tube was possibly leaking last night and TF was stopped.  TF has been restarted. Currently receiving Osmolite 1.5 @ 20 ml/hr again.   If other nutrition issues arise, please consult RD.   Clayton Bibles, MS, RD, LDN Inpatient Clinical Dietitian Contact information available via Amion

## 2021-04-18 NOTE — Progress Notes (Signed)
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  Patient presented to ED with complaints of increasing abdominal pain and bloating around the gastrostomy tube for the last 24 hours PTA.  Also reported vomiting, no diarrhea.  Pain diffusely under the G-tube. 80 CT abdomen showed extravasation of contrast material into the peritoneal cavity of the upper abdomen suggesting leak along the gastrostomy tract Interval summary 7/30-8/2 Patient was admitted with a perforated abdominal ulcer, peritonitis, leaking G-tube, underwent urgent G-tube exchange, Graham patch duodenal ulcer on 7/29 Currently n.p.o., was on IV Zosyn, general surgery following.  On TPN.   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 -Patient with complaints of abdominal pain states pain medication not helping. -Patient status post upper GI series which was negative for leak.  -Continue TPN. -Continue IV Dilaudid as needed as it is controlling pain better. -Continue IV PPI. -There was some concern for PEG tube leakage however reassessed by general surgery and patient to be started on tube feeds at 20 cc an hour. -As tube feeds are increased  and patient tolerating TPN to be weaned off. -Per general surgery.  2.  Squamous cell carcinoma of right supraglottis -Follows with oncology at [facility]. -Status post radiotherapy and concurrent chemotherapy completed February 2022. -Port-A-Cath in place. -Outpatient follow-up with oncology.  3.  Anemia due to antineoplastic chemotherapy -Hemoglobin at 8.2.   -Transfusion threshold hemoglobin < 7.  4.  Hypophosphatemia/hypoalbuminemia/ -TPN per pharmacy.  5.  Alcohol use -Stable. -Follow.  6.  1.3 cm right middle lobe nodule -Incidentally noted on CT -Outpatient follow-up with oncology.  7.  Severe protein calorie malnutrition, underweight -TPN. -Tube feeds started at 20 cc an hour per general surgery.  8.  Generalized debility -PT OT following and recommending home health therapies.    DVT prophylaxis: SCDs Code Status: Full Family Communication: Updated patient.  No family at bedside. Disposition:   Status is: Inpatient  Remains inpatient appropriate because:Inpatient level of care appropriate due to severity of illness  Dispo: The patient is from: Home              Anticipated d/c is to: Home              Patient currently is not medically stable to d/c.   Difficult to place patient No       Consultants:  General surgery: Dr.Stechschulte 04/12/2021  Procedures:  CT abdomen and pelvis 04/12/2021 Upper GI series pending 04/17/2021 Chest x-ray 04/12/2021 Abdominal film PEG tube location 04/12/2021 Modified Graham patch of duodenal ulcer/gastrostomy tube exchange per general surgery: Dr.Stechschulte 04/12/2021   Antimicrobials:  IV Zosyn 7/29 /2022x1 dose.   Subjective: Laying in bed.  States overall abdominal pain improved on current pain regimen.  No chest pain.  No shortness of breath.  Tube feeds to be started today at 20 cc an hour.  General surgery.  Therapy at bedside.    Objective: Vitals:   04/17/21 1636 04/17/21 1951 04/18/21 0457 04/18/21  1101  BP: (!) 136/94 129/86 139/89 115/88  Pulse: 85 87 82 72  Resp: '20 18 18 18  '$ Temp: 99.1 F (37.3 C) 99.1 F (37.3 C) 99.1 F (37.3 C) 98.6 F (37 C)  TempSrc: Oral Oral Oral Oral  SpO2: 100% 100% 100% 100%  Weight:   47.5 kg   Height:        Intake/Output Summary (Last 24 hours) at 04/18/2021 1311 Last data filed at 04/18/2021 1239 Gross per 24 hour  Intake 1181.98 ml  Output 1735 ml  Net -553.02 ml    Filed Weights   04/12/21 0043 04/18/21 0457  Weight: 50.6 kg 47.5 kg    Examination:  General exam: NAD Respiratory system: CTA B.  No wheezes, no crackles, no rhonchi.  Normal respiratory effort.  Cardiovascular system: Regular rate rhythm no murmurs rubs or gallops.  No JVD.  No lower extremity edema. Gastrointestinal system: Abdomen is soft, nondistended, some tenderness to palpation epigastrium and lower abdomen.  Positive bowel sounds.  Honeycomb dressing intact.  PEG tube intact.  Central nervous system: Alert and oriented. No focal neurological deficits. Extremities: Symmetric 5 x 5 power. Skin: No rashes, lesions or ulcers Psychiatry: Judgement and insight appear normal. Mood & affect appropriate.     Data Reviewed: I have personally reviewed following labs and imaging studies  CBC: Recent Labs  Lab 04/12/21 0147 04/13/21 0355 04/14/21 0552 04/15/21 0423 04/16/21 0422 04/18/21 0501  WBC 21.4* 25.0* 20.9* 13.8* 10.5 14.3*  NEUTROABS 19.6* 22.2*  --  12.2*  --  11.8*  HGB 9.9* 8.4* 7.6* 7.7* 8.2* 8.2*  HCT 30.5* 25.9* 23.7* 23.3* 25.4* 26.1*  MCV 96.2 97.0 98.8 96.3 97.3 97.8  PLT 381 292 244 238 258 288     Basic Metabolic Panel: Recent Labs  Lab 04/14/21 0552 04/15/21 0423 04/16/21 0422 04/17/21 0436 04/18/21 0501  NA 136 134* 138 135 136  K 3.7 3.7 3.9 4.1 4.3  CL 102 97* 98 101 100  CO2 '27 30 30 27 28  '$ GLUCOSE 170* 157* 146* 160* 165*  BUN '12 9 9 12 13  '$ CREATININE 0.45* 0.30* 0.42* 0.37* 0.34*  CALCIUM 8.6* 8.4* 8.8* 8.5* 8.5*  MG  1.8 1.9 1.9 1.7 1.7  PHOS 1.7* 2.7 3.2 2.9 2.8     GFR: Estimated Creatinine Clearance: 60.2 mL/min (A) (by C-G formula based on SCr of 0.34 mg/dL (L)).  Liver Function Tests: Recent Labs  Lab 04/12/21 0147 04/13/21 0355 04/15/21 0423 04/18/21 0501  AST 14* 15 12* 12*  ALT '12 11 10 13  '$ ALKPHOS 57 43 46 71  BILITOT 1.5* 1.0 0.4 0.4  PROT 6.7 5.5* 5.5* 6.3*  ALBUMIN 3.4* 2.8* 2.4* 2.5*     CBG: Recent Labs  Lab 04/17/21 2354 04/18/21 0456 04/18/21 0600 04/18/21 0720 04/18/21 1058  GLUCAP 165* 159* 170* 120* 167*      Recent Results (from the past 240 hour(s))  Resp Panel by RT-PCR (Flu A&B, Covid) Nasopharyngeal Swab     Status: None   Collection Time: 04/12/21  5:46 AM   Specimen: Nasopharyngeal Swab; Nasopharyngeal(NP) swabs in vial transport medium  Result Value Ref Range Status   SARS Coronavirus 2 by RT PCR NEGATIVE NEGATIVE Final    Comment: (NOTE) SARS-CoV-2 target nucleic  acids are NOT DETECTED.  The SARS-CoV-2 RNA is generally detectable in upper respiratory specimens during the acute phase of infection. The lowest concentration of SARS-CoV-2 viral copies this assay can detect is 138 copies/mL. A negative result does not preclude SARS-Cov-2 infection and should not be used as the sole basis for treatment or other patient management decisions. A negative result may occur with  improper specimen collection/handling, submission of specimen other than nasopharyngeal swab, presence of viral mutation(s) within the areas targeted by this assay, and inadequate number of viral copies(<138 copies/mL). A negative result must be combined with clinical observations, patient history, and epidemiological information. The expected result is Negative.  Fact Sheet for Patients:  EntrepreneurPulse.com.au  Fact Sheet for Healthcare Providers:  IncredibleEmployment.be  This test is no t yet approved or cleared by the [place]  FDA and  has been authorized for detection and/or diagnosis of SARS-CoV-2 by FDA under an Emergency Use Authorization (EUA). This EUA will remain  in effect (meaning this test can be used) for the duration of the COVID-19 declaration under Section 564(b)(1) of the Act, 21 U.S.C.section 360bbb-3(b)(1), unless the authorization is terminated  or revoked sooner.       Influenza A by PCR NEGATIVE NEGATIVE Final   Influenza B by PCR NEGATIVE NEGATIVE Final    Comment: (NOTE) The Xpert Xpress SARS-CoV-2/FLU/RSV plus assay is intended as an aid in the diagnosis of influenza from Nasopharyngeal swab specimens and should not be used as a sole basis for treatment. Nasal washings and aspirates are unacceptable for Xpert Xpress SARS-CoV-2/FLU/RSV testing.  Fact Sheet for Patients: EntrepreneurPulse.com.au  Fact Sheet for Healthcare Providers: IncredibleEmployment.be  This test is not yet approved or cleared by the [place] FDA and has been authorized for detection and/or diagnosis of SARS-CoV-2 by FDA under an Emergency Use Authorization (EUA). This EUA will remain in effect (meaning this test can be used) for the duration of the COVID-19 declaration under Section 564(b)(1) of the Act, 21 U.S.C. section 360bbb-3(b)(1), unless the authorization is terminated or revoked.  Performed at [facility], [place] [address], [place], [place] [address]   Culture, blood (routine x 2)     Status: None (Preliminary result)   Collection Time: 04/15/21  2:33 PM   Specimen: BLOOD  Result Value Ref Range Status   Specimen Description   Final    BLOOD BLOOD RIGHT FOREARM Performed at [place] [address] [place], [place] [address]    Special Requests   Final    BOTTLES DRAWN AEROBIC AND ANAEROBIC Blood Culture adequate volume Performed at [place] [facility] [address] [place], [place] [address]     Culture   Final    NO GROWTH 3 DAYS Performed at [facility], [place] [address] [place], [place] [address]    Report Status PENDING  Incomplete  Culture, blood (routine x 2)     Status: None (Preliminary result)   Collection Time: 04/15/21  2:36 PM   Specimen: BLOOD  Result Value Ref Range Status   Specimen Description   Final    BLOOD BLOOD LEFT WRIST Performed at [place] [address], [place], [place] [address]    Special Requests   Final    BOTTLES DRAWN AEROBIC AND ANAEROBIC Blood Culture adequate volume Performed at [place] [address], [place], [place] [address]    Culture   Final    NO GROWTH 3 DAYS  Performed at [facility], [place] [address], [place],  [address]    Report Status PENDING  Incomplete          Radiology Studies: DG UGI W SINGLE CM (SOL OR THIN BA)  Result Date: 04/17/2021 CLINICAL DATA:  History of Graham patch repair for perforated proximal duodenal ulcer. Postoperative day 5. EXAM: WATER SOLUBLE UPPER GI SERIES TECHNIQUE: Single-column upper GI series was performed using water soluble contrast. CONTRAST:  Approximately 60 cc water-soluble contrast administered. COMPARISON:  Abdomen pelvis CT 04/12/2021. FLUOROSCOPY TIME:  Fluoroscopy Time:  3 minutes and 7 seconds. Radiation Exposure Index (if provided by the fluoroscopic device): 180.2 mGy Number of Acquired Spot Images: FINDINGS: Patient was evaluated using prone in bilateral oblique positioning with fluoro table in a 30-45 degree head up position. Water-soluble contrast material was a to boost via the existing G-tube. Duodenal bulb has a fixed distorted appearance, likely related to surgery. Gastric emptying is relatively prompt and the duodenum is not dilated. Transit of contrast in the duodenum through the third segment anterior to the spine was somewhat delay, likely due to positioning. Contrast material did migrate past the ligament of  Treitz. Gastric emptying was observed for approximately 10 minutes in there is no evidence for contrast extravasation from the proximal duodenum to suggest leak. No evidence for contrast material entering the surgical drain. IMPRESSION: 1. No demonstrable leak from the proximal duodenum. The duodenal bulb is distorted and fixed in appearance, compatible with recent surgery. The second and third portions of the duodenum are nondilated and demonstrate normal peristaltic activity. 2. No evidence for contrast material entering the surgical drain. Electronically Signed   By: Misty Stanley M.D.   On: 04/17/2021 13:11        Scheduled Meds:  acetaminophen (TYLENOL) oral liquid 160 mg/5 mL  650 mg Per Tube QID   atropine  1 drop Right Eye BID   Chlorhexidine Gluconate Cloth  6 each Topical Daily   enoxaparin (LOVENOX) injection  30 mg Subcutaneous Q24H   feeding supplement (OSMOLITE 1.5 CAL)  1,000 mL Per Tube Q24H   insulin aspart  0-9 Units Subcutaneous Q6H   pantoprazole (PROTONIX) IV  40 mg Intravenous Q12H   prednisoLONE acetate  1 drop Right Eye QID   Continuous Infusions:  lactated ringers 10 mL/hr at 04/18/21 1006   methocarbamol (ROBAXIN) IV     TPN ADULT (ION) 65 mL/hr at 04/18/21 0358   TPN ADULT (ION)       LOS: 6 days    Time spent: 35 minutes    Irine Seal, MD [facility]   To contact the attending provider between 7A-7P or the covering provider during after hours 7P-7A, please log into the web site [email or web address] and access using universal [place] password for that web site. If you do not have the password, please call the hospital operator.  04/18/2021, 1:11 PM

## 2021-04-18 NOTE — Progress Notes (Addendum)
Physical Therapy Treatment Patient Details Name: [name] MRN: [id number] DOB: 03-09-1951 Today's Date: 04/18/2021    History of Present Illness 70 yo male admitted with G tube dysfunction. S/P G tube exchange,duodenal ulcer repair 04/12/21. Hx of throat ca, ETOH abuse    PT Comments    Patient progressing well with mobility and agreeable to initiate ambulation without UE support. Pt mildly unsteady requiring intermittent assist to prevent LOB during gait. Pt holding arms stiff throughout and cues for increased arm swing improved balance and pt progressed to min guard. Educated on importance of exercises for functional strength and began repeat sit<>stands/minisquats with use of UE's for power up. Acute PT will progress gait, HEP, and stair training for safe discharge home.     Follow Up Recommendations  Home health PT;Supervision - Intermittent     Equipment Recommendations  None recommended by PT    Recommendations for Other Services       Precautions / Restrictions Precautions Precautions: Fall Precaution Comments: R jp drain, L g tube Restrictions Weight Bearing Restrictions: No    Mobility  Bed Mobility Overal bed mobility: Needs Assistance Bed Mobility: Supine to Sit     Supine to sit: Supervision;HOB elevated     General bed mobility comments: Supervision for safety and lines    Transfers Overall transfer level: Needs assistance Equipment used: None   Sit to Stand: Supervision         General transfer comment: Supervision for safety and lines. pt steady in standing.  Ambulation/Gait Ambulation/Gait assistance: Min guard;Min assist Gait Distance (Feet): 500 Feet Assistive device: None Gait Pattern/deviations: Step-through pattern;Decreased stride length Gait velocity: decr   General Gait Details: min guard with min assist to steady intermittently as pt with several mild LOB without UE support. cues to facilitate increased arm swing and balance  improved.   Stairs             Wheelchair Mobility    Modified Rankin (Stroke Patients Only)       Balance Overall balance assessment: Needs assistance Sitting-balance support: Feet supported Sitting balance-Leahy Scale: Good     Standing balance support: Single extremity supported;During functional activity Standing balance-Leahy Scale: Fair Standing balance comment: intermittent assist needed during gait.                            Cognition Arousal/Alertness: Awake/alert Behavior During Therapy: WFL for tasks assessed/performed Overall Cognitive Status: Within Functional Limits for tasks assessed                                        Exercises Other Exercises Other Exercises: 5 reps min-squat from recliner with bil UE use for power up.    General Comments        Pertinent Vitals/Pain Pain Assessment: Faces Faces Pain Scale: Hurts little more Pain Location: abdomen Pain Descriptors / Indicators: Aching;Discomfort Pain Intervention(s): Limited activity within patient's tolerance;Monitored during session;Repositioned;Patient requesting pain meds-RN notified;RN gave pain meds during session    Home Living                      Prior Function            PT Goals (current goals can now be found in the care plan section) Acute Rehab PT Goals Patient Stated Goal: none stated PT Goal Formulation:  With patient Time For Goal Achievement: 04/27/21 Potential to Achieve Goals: Good Progress towards PT goals: Progressing toward goals    Frequency    Min 3X/week      PT Plan Current plan remains appropriate    Co-evaluation              AM-PAC PT "6 Clicks" Mobility   Outcome Measure  Help needed turning from your back to your side while in a flat bed without using bedrails?: None Help needed moving from lying on your back to sitting on the side of a flat bed without using bedrails?: A Little Help needed  moving to and from a bed to a chair (including a wheelchair)?: A Little Help needed standing up from a chair using your arms (e.g., wheelchair or bedside chair)?: A Little Help needed to walk in hospital room?: A Little Help needed climbing 3-5 steps with a railing? : A Little 6 Click Score: 19    End of Session Equipment Utilized During Treatment: Gait belt Activity Tolerance: Patient tolerated treatment well Patient left: in chair;with call bell/phone within reach;with family/visitor present;with nursing/sitter in room Nurse Communication: Mobility status PT Visit Diagnosis: Muscle weakness (generalized) (M62.81)     Time: [id number] PT Time Calculation (min) (ACUTE ONLY): 28 min  Charges:  $Gait Training: 8-22 mins $Therapeutic Exercise: 8-22 mins                     [name], DPT Acute Rehabilitation Services Office [phone] Pager [phone]    [name] 04/18/2021, 3:01 PM

## 2021-04-18 NOTE — Plan of Care (Signed)
  Problem: Activity: Goal: Risk for activity intolerance will decrease Outcome: Progressing   Problem: Nutrition: Goal: Adequate nutrition will be maintained Outcome: Progressing   

## 2021-04-19 DIAGNOSIS — K251 Acute gastric ulcer with perforation: Secondary | ICD-10-CM | POA: Diagnosis not present

## 2021-04-19 DIAGNOSIS — E43 Unspecified severe protein-calorie malnutrition: Secondary | ICD-10-CM | POA: Diagnosis not present

## 2021-04-19 DIAGNOSIS — D6481 Anemia due to antineoplastic chemotherapy: Secondary | ICD-10-CM | POA: Diagnosis not present

## 2021-04-19 DIAGNOSIS — K9423 Gastrostomy malfunction: Secondary | ICD-10-CM | POA: Diagnosis not present

## 2021-04-19 LAB — GLUCOSE, CAPILLARY
Glucose-Capillary: 132 mg/dL — ABNORMAL HIGH (ref 70–99)
Glucose-Capillary: 132 mg/dL — ABNORMAL HIGH (ref 70–99)
Glucose-Capillary: 142 mg/dL — ABNORMAL HIGH (ref 70–99)
Glucose-Capillary: 144 mg/dL — ABNORMAL HIGH (ref 70–99)
Glucose-Capillary: 155 mg/dL — ABNORMAL HIGH (ref 70–99)
Glucose-Capillary: 167 mg/dL — ABNORMAL HIGH (ref 70–99)
Glucose-Capillary: 178 mg/dL — ABNORMAL HIGH (ref 70–99)

## 2021-04-19 LAB — BASIC METABOLIC PANEL
Anion gap: 5 (ref 5–15)
BUN: 19 mg/dL (ref 8–23)
CO2: 28 mmol/L (ref 22–32)
Calcium: 8.4 mg/dL — ABNORMAL LOW (ref 8.9–10.3)
Chloride: 102 mmol/L (ref 98–111)
Creatinine, Ser: 0.44 mg/dL — ABNORMAL LOW (ref 0.61–1.24)
GFR, Estimated: >60 mL/min (ref >60)
Glucose, Bld: 147 mg/dL — ABNORMAL HIGH (ref 70–99)
Potassium: 4.4 mmol/L (ref 3.5–5.1)
Sodium: 135 mmol/L (ref 135–145)

## 2021-04-19 LAB — PHOSPHORUS: Phosphorus: 3.1 mg/dL (ref 2.5–4.6)

## 2021-04-19 LAB — MAGNESIUM: Magnesium: 2.1 mg/dL (ref 1.7–2.4)

## 2021-04-19 MED ORDER — AMOXICILLIN 250 MG/5ML PO SUSR
1000.0000 mg | Freq: Two times a day (BID) | ORAL | Status: DC
  Administered 2021-04-19 – 2021-04-22 (×6): 1000 mg
  Filled 2021-04-19 (×6): qty 20

## 2021-04-19 MED ORDER — TRACE MINERALS CU-MN-SE-ZN 300-55-60-3000 MCG/ML IV SOLN
INTRAVENOUS | Status: DC
  Filled 2021-04-19: qty 624

## 2021-04-19 MED ORDER — CLARITHROMYCIN 250 MG/5ML PO SUSR
500.0000 mg | Freq: Two times a day (BID) | ORAL | Status: DC
  Administered 2021-04-19 – 2021-04-22 (×6): 500 mg
  Filled 2021-04-19 (×6): qty 10

## 2021-04-19 MED ORDER — FREE WATER
100.0000 mL | Status: DC
  Administered 2021-04-19 – 2021-04-22 (×18): 100 mL

## 2021-04-19 MED ORDER — OSMOLITE 1.5 CAL PO LIQD
1000.0000 mL | ORAL | Status: DC
  Administered 2021-04-19 – 2021-04-21 (×4): 1000 mL
  Filled 2021-04-19 (×2): qty 1000

## 2021-04-19 MED ORDER — TRACE MINERALS CU-MN-SE-ZN 300-55-60-3000 MCG/ML IV SOLN
INTRAVENOUS | Status: DC
  Filled 2021-04-19: qty 528

## 2021-04-19 NOTE — Progress Notes (Addendum)
PHARMACY - TOTAL PARENTERAL NUTRITION CONSULT NOTE   Indication: Duodenal perf s/p repair 7/29  Patient Measurements: Height: '5\' 8"'$  (172.7 cm) Weight: 47.5 kg (104 lb 11.5 oz) IBW/kg (Calculated) : 68.4 TPN AdjBW (KG): 50.6 Body mass index is 15.92 kg/m. Usual Weight:   Recent Labs    04/18/21 0501 04/19/21 0447  NA 136 135  K 4.3 4.4  CL 100 102  CO2 28 28  GLUCOSE 165* 147*  BUN 13 19  CREATININE 0.34* 0.44*  CALCIUM 8.5* 8.4*  PHOS 2.8 3.1  MG 1.7 2.1  ALBUMIN 2.5*  --   ALKPHOS 71  --   AST 12*  --   ALT 13  --   BILITOT 0.4  --      Assessment:  70 y.o. male w/ laryngeal cancer w/ hx of radiation and currently undergoing chemo. He had g-tube insertion by IR in Jan 2022. PTA has PEG but still eats & drinks- had been putting beer & moonshine into PEG tube per RD note 7/26. He presented the ED on 7/29 with bleeding around g-tube. Abdominal CT on 7/29 showed findings with concern for leak along the gastrostomy tract.  He underwent  emergent exp lap for perforated duodenal ulcer, modified Graham patch of duodenal ulcer, and gastrostomy tube exchange on 7/29.  Pharmacy consulted on 7/29 to start TPN.  Glucose / Insulin: no hx DM  - on sSSI q6h - 7 units used last 24hr - CBG (goal <150): 132-163 Electrolytes:  Na 135; other lytes wnl Renal: scr low (crcl~60) Hepatic: LFTs below ULN; TG 97 (8/1); PreAlb 2.4 (8/1) Intake / Output; MIVF:  - I/O: +209 ml - LR at 10 ml/hr GI Imaging:   - 7/29 CT A/P: suggesting leak along the gastrostomy tract.  - 8/3 UGI: -no demonstrable leak from proximal duodenum   GI Surgeries / Procedures:  - 7/29 emergent exp lap: perforated duodenal ulcer, modified Graham patch of duodenal ulcer, Gastrostomy tube exchange, drain placed  TPN start date: 04/13/2021  Central access: Has PAC placed 09/24/20 TPN start date: 7/30  Nutritional Goals (per RD recommendation on 7/30): PTA: [place] Std 1.4  325 mls QID & flush w/ 90 ml free water  before and after each feed to provide 1820 Kcal & 80 gm protein, 1896 total water  Kcal:  1800-2000kcal/day  Protein:  90-100g/day  Fluid:  1.4-1.6L/day    Goal TPN rate is 65 mL/hr (provides 93g of protein and 1903 kcals per day)    Current Nutrition:  - Osmolite 1.5 at 50 mL/hr (goal 60 ml/hr) - TPN at 55 mL/hr   Plan:  - Per Saverio Danker, ok to d/c TPN when current bag runs out at 6p today - Pharmacy will sign off for TPN. Re-consult [place] if need further assistance  Dia Sitter, PharmD, BCPS 04/20/2021 8:30 AM

## 2021-04-19 NOTE — Progress Notes (Addendum)
Nutrition Follow-up  DOCUMENTATION CODES:   Severe malnutrition in context of chronic illness, Underweight  INTERVENTION:  - increase Osmolite 1.5 to 30 ml/hr at this time and then increase by 10 ml every 8 hours to reach goal rate of 60 ml/hr. - at goal rate, this regimen will provide 2160 kcal, 90 grams protein, and 1097 ml free water. - will order 100 ml free water every 4 hours (600 ml/day). - TPN weaning per Pharmacist--note today states plan to decrease to 55 ml/hr starting at 1800.   NUTRITION DIAGNOSIS:   Severe Malnutrition related to chronic illness, cancer and cancer related treatments as evidenced by severe fat depletion, severe muscle depletion, percent weight loss. -ongoing  GOAL:   Patient will meet greater than or equal to 90% of their needs -met  MONITOR:   TF tolerance, Labs, Weight trends, Skin, I & O's, Other (Comment) (TPN regimen)  REASON FOR ASSESSMENT:   Consult Enteral/tube feeding initiation and management  ASSESSMENT:   70 y.o. male with a h/o metastatic laryngeal SCC s/p radiation/chemo, hep C, etoh abuse, s/p IR G-tube 1/10 (still uses etoh via G-tube) and HTN who is admitted with perforated duodenal ulcer and peritonitis now s/p ex lap with graham patch repair and replacement of 15F G-tube 7/29  Patient remains NPO since admission on 7/29. Discussed patient and his TF and TPN orders with RN. Patient is currently receiving Osmolite 1.5 @ 20 ml/hr and TPN at 65 ml/hr. This is providing a total of 2623 kcal and 123 grams protein/24 hrs.   Patient laying in bed with no family or visitors present. He reports some intermittent cramp-like abdominal pain and some intermittent nausea. He was unable to give further details on this such as exacerbating and alleviating factors. Patient reports having a large BM this AM; RN confirms.   Informed patient of plan to begin to slowly increase TF to goal rate in order to wean off TPN. Patient amendable to this.     Weight yesterday was -5.5 lb compared to admission (7/29) weight. No information documented in the edema section of flow sheet during this admission.    Labs reviewed; CBGs: 178, 142, 155, 167, and 144 mg/dl, creatinine: 0.44 mg/dl, Ca: 8.4 mg/dl. Medications reviewed; sliding scale novolog, 40 mg IV protonix BID.   Diet Order:   Diet Order             Diet NPO time specified  Diet effective now                   EDUCATION NEEDS:   Education needs have been addressed  Skin:  Skin Assessment: Skin Integrity Issues: Skin Integrity Issues:: Incisions Incisions: 7/29 abdomen  Last BM:  8/5  Height:   Ht Readings from Last 1 Encounters:  04/12/21 5' 8"  (1.727 m)    Weight:   Wt Readings from Last 1 Encounters:  04/18/21 47.5 kg     Estimated Nutritional Needs:  Kcal:  1800-2000kcal/day Protein:  90-100g/day Fluid:  1.4-1.6L/day      Jarome Matin, MS, RD, LDN, CNSC Inpatient Clinical Dietitian RD pager # available in [place]  After hours/weekend pager # available in [facility]

## 2021-04-19 NOTE — Progress Notes (Signed)
Progress Note  7 Days Post-Op  Subjective: Patient reports minimal abdominal pain.  Tolerating TFs at [employer].   Objective: Vital signs in last 24 hours: Temp:  [98.7 F (37.1 C)-99.7 F (37.6 C)] 99.7 F (37.6 C) (08/05 1101) Pulse Rate:  [91-92] 91 (08/05 1101) Resp:  [16-17] 16 (08/05 1101) BP: (105-120)/(67-76) 112/69 (08/05 1101) SpO2:  [95 %-99 %] 99 % (08/05 1101) Last BM Date: 04/10/21 (pt unsure)  Intake/Output from previous day: 08/04 0701 - 08/05 0700 In: 1637.7 [I.V.:1530.4; NG/GT:7.3; IV Piggyback:100] Out: 1100 [Urine:1090; Drains:10] Intake/Output this shift: Total I/O In: -  Out: 220 [Urine:200; Drains:20]  PE: Abd: soft, appropriately tender, +BS, g-tube in place with no leaking noted.  Tolerating at [employer].  JP drain is serous   Lab Results:  Recent Labs    04/18/21 0501  WBC 14.3*  HGB 8.2*  HCT 26.1*  PLT 288   BMET Recent Labs    04/18/21 0501 04/19/21 0447  NA 136 135  K 4.3 4.4  CL 100 102  CO2 28 28  GLUCOSE 165* 147*  BUN 13 19  CREATININE 0.34* 0.44*  CALCIUM 8.5* 8.4*   PT/INR No results for input(s): LABPROT, INR in the last 72 hours. CMP     Component Value Date/Time   NA 135 04/19/2021 0447   K 4.4 04/19/2021 0447   CL 102 04/19/2021 0447   CO2 28 04/19/2021 0447   GLUCOSE 147 (H) 04/19/2021 0447   BUN 19 04/19/2021 0447   CREATININE 0.44 (L) 04/19/2021 0447   CREATININE 0.67 03/26/2021 1159   CREATININE 0.61 (L) 10/26/2019 1523   CALCIUM 8.4 (L) 04/19/2021 0447   PROT 6.3 (L) 04/18/2021 0501   ALBUMIN 2.5 (L) 04/18/2021 0501   AST 12 (L) 04/18/2021 0501   AST 12 (L) 03/26/2021 1159   ALT 13 04/18/2021 0501   ALT 12 03/26/2021 1159   ALT 53 (H) 09/06/2019 1543   ALKPHOS 71 04/18/2021 0501   BILITOT 0.4 04/18/2021 0501   BILITOT <0.2 (L) 03/26/2021 1159   GFRNONAA >60 04/19/2021 0447   GFRNONAA >60 03/26/2021 1159   GFRAA >60 01/03/2020 1355   GFRAA >60 05/04/2019 1207   Lipase     Component Value  Date/Time   LIPASE 72 (H) 04/12/2021 0147       Studies/Results: DG UGI W SINGLE CM (SOL OR THIN BA)  Result Date: 04/17/2021 CLINICAL DATA:  History of Graham patch repair for perforated proximal duodenal ulcer. Postoperative day 5. EXAM: WATER SOLUBLE UPPER GI SERIES TECHNIQUE: Single-column upper GI series was performed using water soluble contrast. CONTRAST:  Approximately 60 cc water-soluble contrast administered. COMPARISON:  Abdomen pelvis CT 04/12/2021. FLUOROSCOPY TIME:  Fluoroscopy Time:  3 minutes and 7 seconds. Radiation Exposure Index (if provided by the fluoroscopic device): 180.2 mGy Number of Acquired Spot Images: FINDINGS: Patient was evaluated using prone in bilateral oblique positioning with fluoro table in a 30-45 degree head up position. Water-soluble contrast material was a to boost via the existing G-tube. Duodenal bulb has a fixed distorted appearance, likely related to surgery. Gastric emptying is relatively prompt and the duodenum is not dilated. Transit of contrast in the duodenum through the third segment anterior to the spine was somewhat delay, likely due to positioning. Contrast material did migrate past the ligament of Treitz. Gastric emptying was observed for approximately 10 minutes in there is no evidence for contrast extravasation from the proximal duodenum to suggest leak. No evidence for contrast material entering the  surgical drain. IMPRESSION: 1. No demonstrable leak from the proximal duodenum. The duodenal bulb is distorted and fixed in appearance, compatible with recent surgery. The second and third portions of the duodenum are nondilated and demonstrate normal peristaltic activity. 2. No evidence for contrast material entering the surgical drain. Electronically Signed   By: Misty Stanley M.D.   On: 04/17/2021 13:11    Anti-infectives: Anti-infectives (From admission, onward)    Start     Dose/Rate Route Frequency Ordered Stop   04/12/21 1200   piperacillin-tazobactam (ZOSYN) IVPB 3.375 g  Status:  Discontinued        3.375 g 12.5 mL/hr over 240 Minutes Intravenous Every 8 hours 04/12/21 0920 04/17/21 1453   04/12/21 0600  piperacillin-tazobactam (ZOSYN) IVPB 3.375 g        3.375 g 100 mL/hr over 30 Minutes Intravenous  Once 04/12/21 0549 04/12/21 [id number]        Assessment/Plan POD 7, s/p ex lap graham patich and G tube exchange by Dr. Lanny Hurst 7/29 for perforated duodenal ulcer - adv TFs to goal rate per dietitian protocol - begin to wean TNA to off as TFs increase - UGI negative - H. Pylori +, will need treatment for this for 14 days as well as GI follow up as outpatient   FEN: NPO, wean TPN, TFs at 20cc/hr and to adv to goal rate ID: [id number] 7/28>> stopped VTE: SCDs, Lovenox   SCC of R supraglottis Anemia Alcohol use Tobacco abuse - advised cessation R middle lobe lung nodule Severe Protein calorie malnutrition - prealbumin <5 8/1  LOS: 7 days    Henreitta Cea, [facility] Surgery 04/19/2021, 11:07 AM Please see Amion for pager number during day hours 7:00am-4:30pm

## 2021-04-19 NOTE — Progress Notes (Signed)
NUTRITION NOTE  RN alerted RD that patient has Q4h and Q6h CBG checks ordered. Reviewed CBG readings over the past 24 hours. Will d/c order for Q4h checks, maintain order for Q6h checks.   RN reports patient is receiving Osmolite 1.5 @ 20 ml/hr and tolerating. She reports TPN is @ 65 ml/hr.   RD will continue to follow per protocol.       Jarome Matin, MS, RD, LDN, CNSC Inpatient Clinical Dietitian RD pager # available in [place]  After hours/weekend pager # available in [facility]

## 2021-04-19 NOTE — Progress Notes (Signed)
Physical Therapy Treatment Patient Details Name: [name] MRN: [id number] DOB: 11/04/1950 Today's Date: 04/19/2021    History of Present Illness 70 yo male admitted with G tube dysfunction. S/P G tube exchange,duodenal ulcer repair 04/12/21. Hx of throat ca, ETOH abuse    PT Comments    The patient required encouragement to get up and ambulate. Patient pushed IV pole x 400'. Will  place on mobility  tech list to increase amabulation for improved balance.  Follow Up Recommendations  Home health PT;Supervision - Intermittent     Equipment Recommendations  None recommended by PT    Recommendations for Other Services       Precautions / Restrictions Precautions Precaution Comments: R jp drain, L g tube, L port    Mobility  Bed Mobility         Supine to sit: Supervision     General bed mobility comments: Supervision for safety and lines    Transfers   Equipment used: None Transfers: Sit to/from Stand Sit to Stand: Supervision         General transfer comment: Supervision for safety and lines. pt steady in standing.  Ambulation/Gait Ambulation/Gait assistance: Min guard Gait Distance (Feet): 400 Feet Assistive device: IV Pole Gait Pattern/deviations: Step-through pattern     General Gait Details: patient  wanted to push IV pole   Stairs             Wheelchair Mobility    Modified Rankin (Stroke Patients Only)       Balance     Sitting balance-Leahy Scale: Good     Standing balance support: No upper extremity supported Standing balance-Leahy Scale: Fair                              Cognition Arousal/Alertness: Awake/alert                                     General Comments: pt not much of a conversation      Exercises      General Comments        Pertinent Vitals/Pain Pain Assessment: No/denies pain    Home Living                      Prior Function            PT Goals  (current goals can now be found in the care plan section) Progress towards PT goals: Progressing toward goals    Frequency    Min 3X/week      PT Plan Current plan remains appropriate    Co-evaluation              AM-PAC PT "6 Clicks" Mobility   Outcome Measure  Help needed turning from your back to your side while in a flat bed without using bedrails?: None Help needed moving from lying on your back to sitting on the side of a flat bed without using bedrails?: None Help needed moving to and from a bed to a chair (including a wheelchair)?: None Help needed standing up from a chair using your arms (e.g., wheelchair or bedside chair)?: None Help needed to walk in hospital room?: A Little Help needed climbing 3-5 steps with a railing? : A Little 6 Click Score: 22    End of Session Equipment Utilized During Treatment: Gait  belt Activity Tolerance: Patient tolerated treatment well Patient left: in chair;with call bell/phone within reach;with chair alarm set Nurse Communication: Mobility status PT Visit Diagnosis: Muscle weakness (generalized) (M62.81)     Time: [id number] PT Time Calculation (min) (ACUTE ONLY): 25 min  Charges:  $Gait Training: 23-37 mins                     Tresa Endo PT Acute Rehabilitation Services Pager [phone] Office [phone]    [name] 04/19/2021, 4:08 PM

## 2021-04-19 NOTE — Progress Notes (Signed)
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  Patient presented to ED with complaints of increasing abdominal pain and bloating around the gastrostomy tube for the last 24 hours PTA.  Also reported vomiting, no diarrhea.  Pain diffusely under the G-tube. 80 CT abdomen showed extravasation of contrast material into the peritoneal cavity of the upper abdomen suggesting leak along the gastrostomy tract Interval summary 7/30-8/2 Patient was admitted with a perforated abdominal ulcer, peritonitis, leaking G-tube, underwent urgent G-tube exchange, Graham patch duodenal ulcer on 7/29 Currently n.p.o., was on IV Zosyn, general surgery following.  On TPN.   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 -Patient with improvement with abdominal pain on current pain regimen. -Patient status post upper GI series which was negative for leak.  -Continue IV Dilaudid as needed as it is controlling pain better. -Continue IV PPI. -There was some concern for PEG tube leakage however reassessed by general surgery and patient to be started on tube feeds at 20 cc an hour and slowly been advanced to  goal.. -As tube feeds are increased and patient tolerating and once tube feeds at goal could start weaning TPN per general surgery recommendations.  -H. pylori IgG antibody elevated at 1.85. -Start treatment for H. pylori ( triple therapy with amox 1gm BID, clarithromycin 500 mg twice daily, PPI x14 days) as well as outpatient follow-up with GI. -Per general surgery.  2.  Squamous cell carcinoma of right supraglottis -Follows with oncology at [facility]. -Status post radiotherapy and concurrent chemotherapy completed February 2022. -Port-A-Cath in place. -Outpatient follow-up with oncology.  3.  Anemia due to antineoplastic chemotherapy -Hemoglobin at 8.2.   -Transfusion threshold hemoglobin < 7.  4.  Hypophosphatemia/hypoalbuminemia/ -TPN per pharmacy.  5.  Alcohol use -Stable. -Follow.  6.  1.3 cm right middle lobe nodule -Incidentally noted on CT -Outpatient follow-up with oncology.  7.  Severe protein calorie malnutrition, underweight -TPN. -Tube feeds started at 20 cc an hour per general surgery and being slowly advanced. -Once tube feeds at goal could start weaning TPN per general surgery recommendations.  8.  Generalized debility -PT/OT following recommending home health therapies.     DVT prophylaxis: SCDs Code Status: Full Family Communication: Updated patient.  No family at bedside. Disposition:   Status is: Inpatient  Remains inpatient appropriate because:Inpatient level of care appropriate due to severity of illness  Dispo: The patient is from: Home              Anticipated d/c is to: Home              Patient currently is not medically stable to d/c.   Difficult to place patient No       Consultants:  General surgery: Dr.Stechschulte 04/12/2021  Procedures:  CT abdomen and pelvis 04/12/2021 Upper GI series pending 04/17/2021 Chest x-ray 04/12/2021 Abdominal film PEG tube location 04/12/2021 Modified Graham patch of duodenal ulcer/gastrostomy tube  exchange per general surgery: Dr.Stechschulte 04/12/2021   Antimicrobials:  IV Zosyn 7/29 /2022x1 dose.   Subjective: Laying in bed.  States some improvement with abdominal pain.  No nausea or vomiting.  No chest pain.  No shortness of breath.  Tube feeds started.  Objective: Vitals:   04/18/21 1101 04/18/21 2202 04/19/21 0500 04/19/21 1101  BP: 115/88 105/67 120/76 112/69  Pulse: 72 92 91 91  Resp: '18 17  16  '$ Temp: 98.6 F (37 C) 98.8 F (37.1 C) 98.7 F (37.1 C) 99.7 F (37.6 C)  TempSrc: Oral Oral Oral Oral  SpO2: 100% 99% 95% 99%  Weight:      Height:        Intake/Output Summary (Last 24 hours) at 04/19/2021 1239 Last data filed at 04/19/2021 0951 Gross per 24 hour  Intake 1115.53 ml  Output 1080 ml  Net 35.53 ml    Filed Weights   04/12/21 0043 04/18/21 0457  Weight: 50.6 kg 47.5 kg    Examination:  General exam: NAD Respiratory system: Lungs clear to auscultation bilaterally anterior lung fields.  No wheezes, no crackles, no rhonchi.  Normal respiratory effort.  Cardiovascular system: RRR no murmurs rubs or gallops.  No JVD.  No lower extremity edema.   Gastrointestinal system: Abdomen is soft, nondistended, decreased tenderness to palpation in the epigastrium and lower abdomen.  Positive bowel sounds.  Honeycomb dressing intact.  PEG tube intact.  Central nervous system: Alert and oriented. No focal neurological deficits. Extremities: Symmetric 5 x 5 power. Skin: No rashes, lesions or ulcers Psychiatry: Judgement and insight appear normal. Mood & affect appropriate.     Data Reviewed: I have personally reviewed following labs and imaging studies  CBC: Recent Labs  Lab 04/13/21 0355 04/14/21 0552 04/15/21 0423 04/16/21 0422 04/18/21 0501  WBC 25.0* 20.9* 13.8* 10.5 14.3*  NEUTROABS 22.2*  --  12.2*  --  11.8*  HGB 8.4* 7.6* 7.7* 8.2* 8.2*  HCT 25.9* 23.7* 23.3* 25.4* 26.1*  MCV 97.0 98.8 96.3 97.3 97.8  PLT 292 244 238 258 288     Basic  Metabolic Panel: Recent Labs  Lab 04/15/21 0423 04/16/21 0422 04/17/21 0436 04/18/21 0501 04/19/21 0447  NA 134* 138 135 136 135  K 3.7 3.9 4.1 4.3 4.4  CL 97* 98 101 100 102  CO2 '30 30 27 28 28  '$ GLUCOSE 157* 146* 160* 165* 147*  BUN '9 9 12 13 19  '$ CREATININE 0.30* 0.42* 0.37* 0.34* 0.44*  CALCIUM 8.4* 8.8* 8.5* 8.5* 8.4*  MG 1.9 1.9 1.7 1.7 2.1  PHOS 2.7 3.2 2.9 2.8 3.1     GFR: Estimated Creatinine Clearance: 60.2 mL/min (A) (by C-G formula based on SCr of 0.44 mg/dL (L)).  Liver Function Tests: Recent Labs  Lab 04/13/21 0355 04/15/21 0423 04/18/21 0501  AST 15 12* 12*  ALT '11 10 13  '$ ALKPHOS 43 46 71  BILITOT 1.0 0.4 0.4  PROT 5.5* 5.5* 6.3*  ALBUMIN 2.8* 2.4* 2.5*     CBG: Recent Labs  Lab 04/19/21 0026 04/19/21 0427 04/19/21 0657 04/19/21 0715 04/19/21 1058  GLUCAP 178* 142* 155* 167* 144*      Recent Results (from the past 240 hour(s))  Resp Panel by RT-PCR (Flu A&B, Covid) Nasopharyngeal Swab     Status: None  Collection Time: 04/12/21  5:46 AM   Specimen: Nasopharyngeal Swab; Nasopharyngeal(NP) swabs in vial transport medium  Result Value Ref Range Status   SARS Coronavirus 2 by RT PCR NEGATIVE NEGATIVE Final    Comment: (NOTE) SARS-CoV-2 target nucleic acids are NOT DETECTED.  The SARS-CoV-2 RNA is generally detectable in upper respiratory specimens during the acute phase of infection. The lowest concentration of SARS-CoV-2 viral copies this assay can detect is 138 copies/mL. A negative result does not preclude SARS-Cov-2 infection and should not be used as the sole basis for treatment or other patient management decisions. A negative result may occur with  improper specimen collection/handling, submission of specimen other than nasopharyngeal swab, presence of viral mutation(s) within the areas targeted by this assay, and inadequate number of viral copies(<138 copies/mL). A negative result must be combined with clinical observations,  patient history, and epidemiological information. The expected result is Negative.  Fact Sheet for Patients:  EntrepreneurPulse.com.au  Fact Sheet for Healthcare Providers:  IncredibleEmployment.be  This test is no t yet approved or cleared by the [place] FDA and  has been authorized for detection and/or diagnosis of SARS-CoV-2 by FDA under an Emergency Use Authorization (EUA). This EUA will remain  in effect (meaning this test can be used) for the duration of the COVID-19 declaration under Section 564(b)(1) of the Act, 21 U.S.C.section 360bbb-3(b)(1), unless the authorization is terminated  or revoked sooner.       Influenza A by PCR NEGATIVE NEGATIVE Final   Influenza B by PCR NEGATIVE NEGATIVE Final    Comment: (NOTE) The Xpert Xpress SARS-CoV-2/FLU/RSV plus assay is intended as an aid in the diagnosis of influenza from Nasopharyngeal swab specimens and should not be used as a sole basis for treatment. Nasal washings and aspirates are unacceptable for Xpert Xpress SARS-CoV-2/FLU/RSV testing.  Fact Sheet for Patients: EntrepreneurPulse.com.au  Fact Sheet for Healthcare Providers: IncredibleEmployment.be  This test is not yet approved or cleared by the [place] FDA and has been authorized for detection and/or diagnosis of SARS-CoV-2 by FDA under an Emergency Use Authorization (EUA). This EUA will remain in effect (meaning this test can be used) for the duration of the COVID-19 declaration under Section 564(b)(1) of the Act, 21 U.S.C. section 360bbb-3(b)(1), unless the authorization is terminated or revoked.  Performed at [facility], [place] [address] [place], [place] [address]   Culture, blood (routine x 2)     Status: None (Preliminary result)   Collection Time: 04/15/21  2:33 PM   Specimen: BLOOD  Result Value Ref Range Status   Specimen Description   Final    BLOOD  BLOOD RIGHT FOREARM Performed at [place] [address] [place], [place] [address]    Special Requests   Final    BOTTLES DRAWN AEROBIC AND ANAEROBIC Blood Culture adequate volume Performed at [place] [address], [place], [place] [address]    Culture   Final    NO GROWTH 4 DAYS Performed at [facility], [place] [address] [place], [place] [address]    Report Status PENDING  Incomplete  Culture, blood (routine x 2)     Status: None (Preliminary result)   Collection Time: 04/15/21  2:36 PM   Specimen: BLOOD  Result Value Ref Range Status   Specimen Description   Final    BLOOD BLOOD LEFT WRIST Performed at [place] [address] [place], [place] [address]    Special Requests  Final    BOTTLES DRAWN AEROBIC AND ANAEROBIC Blood Culture adequate volume Performed at [place] [address], [place], [place] [address]    Culture   Final    NO GROWTH 4 DAYS Performed at [facility], [place] [address], [place], [place] [address]    Report Status PENDING  Incomplete          Radiology Studies: DG UGI W SINGLE CM (SOL OR THIN BA)  Result Date: 04/17/2021 CLINICAL DATA:  History of Graham patch repair for perforated proximal duodenal ulcer. Postoperative day 5. EXAM: WATER SOLUBLE UPPER GI SERIES TECHNIQUE: Single-column upper GI series was performed using water soluble contrast. CONTRAST:  Approximately 60 cc water-soluble contrast administered. COMPARISON:  Abdomen pelvis CT 04/12/2021. FLUOROSCOPY TIME:  Fluoroscopy Time:  3 minutes and 7 seconds. Radiation Exposure Index (if provided by the fluoroscopic device): 180.2 mGy Number of Acquired Spot Images: FINDINGS: Patient was evaluated using prone in bilateral oblique positioning with fluoro table in a 30-45 degree head up position. Water-soluble contrast material was a to boost via the existing G-tube. Duodenal bulb has a  fixed distorted appearance, likely related to surgery. Gastric emptying is relatively prompt and the duodenum is not dilated. Transit of contrast in the duodenum through the third segment anterior to the spine was somewhat delay, likely due to positioning. Contrast material did migrate past the ligament of Treitz. Gastric emptying was observed for approximately 10 minutes in there is no evidence for contrast extravasation from the proximal duodenum to suggest leak. No evidence for contrast material entering the surgical drain. IMPRESSION: 1. No demonstrable leak from the proximal duodenum. The duodenal bulb is distorted and fixed in appearance, compatible with recent surgery. The second and third portions of the duodenum are nondilated and demonstrate normal peristaltic activity. 2. No evidence for contrast material entering the surgical drain. Electronically Signed   By: Misty Stanley M.D.   On: 04/17/2021 13:11        Scheduled Meds:  acetaminophen (TYLENOL) oral liquid 160 mg/5 mL  650 mg Per Tube QID   atropine  1 drop Right Eye BID   Chlorhexidine Gluconate Cloth  6 each Topical Daily   enoxaparin (LOVENOX) injection  30 mg Subcutaneous Q24H   feeding supplement (OSMOLITE 1.5 CAL)  1,000 mL Per Tube Q24H   free water  100 mL Per Tube Q4H   insulin aspart  0-9 Units Subcutaneous Q6H   pantoprazole (PROTONIX) IV  40 mg Intravenous Q12H   prednisoLONE acetate  1 drop Right Eye QID   Continuous Infusions:  lactated ringers 10 mL/hr at 04/19/21 0436   methocarbamol (ROBAXIN) IV     TPN ADULT (ION) 65 mL/hr at 04/19/21 0436   TPN ADULT (ION)       LOS: 7 days    Time spent: 35 minutes    Irine Seal, MD [facility]   To contact the attending provider between 7A-7P or the covering provider during after hours 7P-7A, please log into the web site [email or web address] and access using universal [place] password for that web site. If you do not have the password, please call the  hospital operator.  04/19/2021, 12:39 PM

## 2021-04-20 DIAGNOSIS — D6481 Anemia due to antineoplastic chemotherapy: Secondary | ICD-10-CM | POA: Diagnosis not present

## 2021-04-20 DIAGNOSIS — E43 Unspecified severe protein-calorie malnutrition: Secondary | ICD-10-CM | POA: Diagnosis not present

## 2021-04-20 DIAGNOSIS — K251 Acute gastric ulcer with perforation: Secondary | ICD-10-CM | POA: Diagnosis not present

## 2021-04-20 DIAGNOSIS — K9423 Gastrostomy malfunction: Secondary | ICD-10-CM | POA: Diagnosis not present

## 2021-04-20 LAB — BASIC METABOLIC PANEL
Anion gap: 6 (ref 5–15)
BUN: 14 mg/dL (ref 8–23)
CO2: 28 mmol/L (ref 22–32)
Calcium: 8.2 mg/dL — ABNORMAL LOW (ref 8.9–10.3)
Chloride: 101 mmol/L (ref 98–111)
Creatinine, Ser: 0.31 mg/dL — ABNORMAL LOW (ref 0.61–1.24)
GFR, Estimated: >60 mL/min (ref >60)
Glucose, Bld: 163 mg/dL — ABNORMAL HIGH (ref 70–99)
Potassium: 4.3 mmol/L (ref 3.5–5.1)
Sodium: 135 mmol/L (ref 135–145)

## 2021-04-20 LAB — CBC
HCT: 23 % — ABNORMAL LOW (ref 39.0–52.0)
Hemoglobin: 7.3 g/dL — ABNORMAL LOW (ref 13.0–17.0)
MCH: 31.2 pg (ref 26.0–34.0)
MCHC: 31.7 g/dL (ref 30.0–36.0)
MCV: 98.3 fL (ref 80.0–100.0)
Platelets: 295 10*3/uL (ref 150–400)
RBC: 2.34 MIL/uL — ABNORMAL LOW (ref 4.22–5.81)
RDW: 14.9 % (ref 11.5–15.5)
WBC: 14 10*3/uL — ABNORMAL HIGH (ref 4.0–10.5)
nRBC: 0 % (ref 0.0–0.2)

## 2021-04-20 LAB — GLUCOSE, CAPILLARY
Glucose-Capillary: 112 mg/dL — ABNORMAL HIGH (ref 70–99)
Glucose-Capillary: 114 mg/dL — ABNORMAL HIGH (ref 70–99)
Glucose-Capillary: 117 mg/dL — ABNORMAL HIGH (ref 70–99)
Glucose-Capillary: 142 mg/dL — ABNORMAL HIGH (ref 70–99)
Glucose-Capillary: 155 mg/dL — ABNORMAL HIGH (ref 70–99)
Glucose-Capillary: 160 mg/dL — ABNORMAL HIGH (ref 70–99)

## 2021-04-20 LAB — CULTURE, BLOOD (ROUTINE X 2)
Culture: NO GROWTH
Culture: NO GROWTH
Special Requests: ADEQUATE
Special Requests: ADEQUATE

## 2021-04-20 MED ORDER — GUAIFENESIN 100 MG/5ML PO SOLN
10.0000 mL | Freq: Two times a day (BID) | ORAL | Status: DC
  Administered 2021-04-20 – 2021-04-22 (×5): 200 mg
  Filled 2021-04-20 (×2): qty 10
  Filled 2021-04-20: qty 20
  Filled 2021-04-20 (×2): qty 10

## 2021-04-20 MED ORDER — TRACE MINERALS CU-MN-SE-ZN 300-55-60-3000 MCG/ML IV SOLN: INTRAVENOUS | Status: DC

## 2021-04-20 MED ORDER — BISACODYL 10 MG RE SUPP: 10.0000 mg | Freq: Every day | RECTAL | Status: DC

## 2021-04-20 MED ORDER — GLYCOPYRROLATE 0.2 MG/ML IJ SOLN
0.1000 mg | Freq: Once | INTRAMUSCULAR | Status: AC
  Administered 2021-04-20: 0.1 mg via INTRAVENOUS
  Filled 2021-04-20: qty 0.5

## 2021-04-20 NOTE — Progress Notes (Signed)
Physical Therapy Treatment Patient Details Name: [name] MRN: [id number] DOB: 11/07/1950 Today's Date: 04/20/2021    History of Present Illness 70 yo male admitted with G tube dysfunction. S/P G tube exchange,duodenal ulcer repair 04/12/21. Hx of throat ca, ETOH abuse    PT Comments    Pt min/guard-supervision level with mobility and progressing well.  Pt should continue to mobilize often and encourage to ambulate with staff.    Follow Up Recommendations  Home health PT;Supervision - Intermittent     Equipment Recommendations  None recommended by PT    Recommendations for Other Services       Precautions / Restrictions Precautions Precautions: Fall Precaution Comments: R jp drain, L g tube, L port Restrictions Weight Bearing Restrictions: No    Mobility  Bed Mobility Overal bed mobility: Needs Assistance Bed Mobility: Supine to Sit     Supine to sit: HOB elevated;Supervision     General bed mobility comments: Supervision for safety and lines    Transfers Overall transfer level: Needs assistance Equipment used: None Transfers: Sit to/from Stand Sit to Stand: Supervision         General transfer comment: Supervision for safety and lines. pt steady in standing.  Ambulation/Gait Ambulation/Gait assistance: Min Gaffer (Feet): 400 Feet Assistive device: IV Pole Gait Pattern/deviations: Step-through pattern     General Gait Details: pt preferred to push IV pole, no unsteadiness or LOB observed, tolerated good distance   Stairs             Wheelchair Mobility    Modified Rankin (Stroke Patients Only)       Balance                                            Cognition Arousal/Alertness: Awake/alert Behavior During Therapy: WFL for tasks assessed/performed;Flat affect Overall Cognitive Status: Within Functional Limits for tasks assessed                                         Exercises      General Comments        Pertinent Vitals/Pain Pain Assessment: Faces Faces Pain Scale: Hurts even more Pain Location: abdomen Pain Descriptors / Indicators: Aching;Discomfort Pain Intervention(s): Repositioned;Monitored during session (RN and MD aware of abdominal pain)    Home Living                      Prior Function            PT Goals (current goals can now be found in the care plan section) Progress towards PT goals: Progressing toward goals    Frequency    Min 3X/week      PT Plan Current plan remains appropriate    Co-evaluation              AM-PAC PT "6 Clicks" Mobility   Outcome Measure  Help needed turning from your back to your side while in a flat bed without using bedrails?: None Help needed moving from lying on your back to sitting on the side of a flat bed without using bedrails?: None Help needed moving to and from a bed to a chair (including a wheelchair)?: None Help needed standing up from a chair using your arms (  e.g., wheelchair or bedside chair)?: None Help needed to walk in hospital room?: A Little Help needed climbing 3-5 steps with a railing? : A Little 6 Click Score: 22    End of Session Equipment Utilized During Treatment: Gait belt Activity Tolerance: Patient tolerated treatment well Patient left: with nursing/sitter in room (in bathroom with NT present in room, also aware to use pull cord) Nurse Communication: Mobility status PT Visit Diagnosis: Muscle weakness (generalized) (M62.81)     Time: [id number] PT Time Calculation (min) (ACUTE ONLY): 15 min  Charges:  $Gait Training: 8-22 mins                    Arlyce Dice, DPT Acute Rehabilitation Services Pager: [phone] Office: [phone]    Dulcy Sida,KATHrine E 04/20/2021, 11:54 AM

## 2021-04-20 NOTE — Progress Notes (Signed)
PHARMACY - TOTAL PARENTERAL NUTRITION CONSULT NOTE   Indication: Duodenal perf s/p repair 7/29  Patient Measurements: Height: '5\' 8"'$  (172.7 cm) Weight: 50.1 kg (110 lb 7.2 oz) IBW/kg (Calculated) : 68.4 TPN AdjBW (KG): 50.6 Body mass index is 16.79 kg/m. Usual Weight:   Recent Labs    04/18/21 0501 04/19/21 0447 04/20/21 0536  NA 136 135 135  K 4.3 4.4 4.3  CL 100 102 101  CO2 '28 28 28  '$ GLUCOSE 165* 147* 163*  BUN '13 19 14  '$ CREATININE 0.34* 0.44* 0.31*  CALCIUM 8.5* 8.4* 8.2*  PHOS 2.8 3.1  --   MG 1.7 2.1  --   ALBUMIN 2.5*  --   --   ALKPHOS 71  --   --   AST 12*  --   --   ALT 13  --   --   BILITOT 0.4  --   --       Assessment:  70 y.o. male w/ laryngeal cancer w/ hx of radiation and currently undergoing chemo. He had g-tube insertion by IR in Jan 2022. PTA has PEG but still eats & drinks- had been putting beer & moonshine into PEG tube per RD note 7/26. He presented the ED on 7/29 with bleeding around g-tube. Abdominal CT on 7/29 showed findings with concern for leak along the gastrostomy tract.  He underwent  emergent exp lap for perforated duodenal ulcer, modified Graham patch of duodenal ulcer, and gastrostomy tube exchange on 7/29.  Pharmacy consulted on 7/29 to start TPN.  Glucose / Insulin: no hx DM  - on sSSI q6h - 7 units used last 24hr - CBG (goal <150): 132-163 Electrolytes:  Na 135; other lytes wnl Renal: scr low (crcl~60) Hepatic: LFTs below ULN; TG 97 (8/1); PreAlb 2.4 (8/1) Intake / Output; MIVF:  - I/O: +209 ml - LR at 10 ml/hr GI Imaging:   - 7/29 CT A/P: suggesting leak along the gastrostomy tract.  - 8/3 UGI: -no demonstrable leak from proximal duodenum   GI Surgeries / Procedures:  - 7/29 emergent exp lap: perforated duodenal ulcer, modified Graham patch of duodenal ulcer, Gastrostomy tube exchange, drain placed  TPN start date: 04/13/2021  Central access: Has PAC placed 09/24/20 TPN start date: 7/30  Nutritional Goals (per RD  recommendation on 7/30): PTA: [place] Std 1.4  325 mls QID & flush w/ 90 ml free water before and after each feed to provide 1820 Kcal & 80 gm protein, 1896 total water  Kcal:  1800-2000kcal/day  Protein:  90-100g/day  Fluid:  1.4-1.6L/day    Goal TPN rate is 65 mL/hr (provides 93g of protein and 1903 kcals per day)    Current Nutrition:  - Osmolite 1.5 at 50 mL/hr (goal 60 ml/hr) - TPN at 55 mL/hr   Plan:  - Per Saverio Danker, ok to d/c TPN when current bag runs out at 6p today - Pharmacy will sign off for TPN. Re-consult [place] if need further assistance  Dia Sitter, PharmD, BCPS 04/20/2021 12:52 PM  [place]: spoke to Dr. Brantley Stage. He's ok with stopping TPN when bag runs out today (8/6) at [place], PharmD, BCPS 04/20/2021 12:56 PM

## 2021-04-20 NOTE — Progress Notes (Signed)
8 Days Post-Op   Subjective/Chief Complaint: some abdominal pain today  Complains of some abdominal pain around his incision    Objective: Vital signs in last 24 hours: Temp:  [98.8 F (37.1 C)-99.7 F (37.6 C)] 99.2 F (37.3 C) (08/06 0951) Pulse Rate:  [88-93] 88 (08/06 0951) Resp:  [14-18] 14 (08/06 0951) BP: (106-116)/(69-70) 116/70 (08/06 0951) SpO2:  [99 %-100 %] 100 % (08/06 0951) Weight:  [50.1 kg] 50.1 kg (08/06 0500) Last BM Date: 04/10/21 (pt unsure)  Intake/Output from previous day: 08/05 0701 - 08/06 0700 In: 1479.6 [I.V.:949.6; NG/GT:500] Out: 1270 [Urine:1225; Drains:45] Intake/Output this shift: Total I/O In: 423.3 [NG/GT:423.3] Out: 1 [Stool:1]   Abd: soft, appropriately tender, +BS, g-tube in place with no leaking noted.  Tolerating at [employer].  JP drain is serous   Lab Results:  Recent Labs    04/18/21 0501 04/20/21 0536  WBC 14.3* 14.0*  HGB 8.2* 7.3*  HCT 26.1* 23.0*  PLT 288 295   BMET Recent Labs    04/19/21 0447 04/20/21 0536  NA 135 135  K 4.4 4.3  CL 102 101  CO2 28 28  GLUCOSE 147* 163*  BUN 19 14  CREATININE 0.44* 0.31*  CALCIUM 8.4* 8.2*   PT/INR No results for input(s): LABPROT, INR in the last 72 hours. ABG No results for input(s): PHART, HCO3 in the last 72 hours.  Invalid input(s): PCO2, PO2  Studies/Results: No results found.  Anti-infectives: Anti-infectives (From admission, onward)    Start     Dose/Rate Route Frequency Ordered Stop   04/19/21 2200  amoxicillin (AMOXIL) 250 MG/5ML suspension 1,000 mg        1,000 mg Per Tube Every 12 hours 04/19/21 2023 05/03/21 2159   04/19/21 2200  clarithromycin (BIAXIN) 250 MG/5ML suspension 500 mg        500 mg Per Tube Every 12 hours 04/19/21 2023 05/03/21 2159   04/12/21 1200  piperacillin-tazobactam (ZOSYN) IVPB 3.375 g  Status:  Discontinued        3.375 g 12.5 mL/hr over 240 Minutes Intravenous Every 8 hours 04/12/21 0920 04/17/21 1453   04/12/21 0600   piperacillin-tazobactam (ZOSYN) IVPB 3.375 g        3.375 g 100 mL/hr over 30 Minutes Intravenous  Once 04/12/21 0549 04/12/21 [id number]       Assessment/Plan: s/p Procedure(s): EXPLORATORY LAPAROTOMY, REPAIR OF DUODENAL ULCER WITH GRAHAM PATCH (N/A) POD 8, s/p ex lap graham patich and G tube exchange by Dr. Lanny Hurst 7/29 for perforated duodenal ulcer - adv TFs to goal rate per dietitian protocol - begin to wean TNA to off as TFs increase - UGI negative - H. Pylori +, will need treatment for this for 14 days as well as GI follow up as outpatient   FEN: NPO, wean TPN, TFs at 20cc/hr and to adv to goal rate ID: zosyn 7/28>> stopped VTE: SCDs, Lovenox   SCC of R supraglottis Anemia Alcohol use Tobacco abuse - advised cessation R middle lobe lung nodule Severe Protein calorie malnutrition - prealbumin <5 8/1  LOS: 8 days    Turner Daniels MD  04/20/2021

## 2021-04-20 NOTE — Plan of Care (Signed)
  Problem: Health Behavior/Discharge Planning: Goal: Ability to manage health-related needs will improve Outcome: Progressing   Problem: Clinical Measurements: Goal: Will remain free from infection Outcome: Progressing   Problem: Pain Managment: Goal: General experience of comfort will improve Outcome: Progressing   

## 2021-04-20 NOTE — Progress Notes (Signed)
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  Patient presented to ED with complaints of increasing abdominal pain and bloating around the gastrostomy tube for the last 24 hours PTA.  Also reported vomiting, no diarrhea.  Pain diffusely under the G-tube. 80 CT abdomen showed extravasation of contrast material into the peritoneal cavity of the upper abdomen suggesting leak along the gastrostomy tract Interval summary 7/30-8/2 Patient was admitted with a perforated abdominal ulcer, peritonitis, leaking G-tube, underwent urgent G-tube exchange, Graham patch duodenal ulcer on 7/29 Currently n.p.o., was on IV Zosyn, general surgery following.  On TPN.   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 -Patient with improvement with abdominal pain on current pain regimen. -Patient status post upper GI series which was negative for leak.  -Continue IV Dilaudid as needed as it is controlling pain better. -Continue IV PPI. -There was some concern for PEG tube leakage however reassessed by general surgery and patient to be started on tube feeds at 20 cc an hour and slowly been advanced to  goal.. -As tube feeds are increased and patient tolerating and once tube feeds at goal could start weaning TPN per general surgery recommendations.  -H. pylori IgG antibody elevated at 1.85. -Patient started on treatment on 04/19/2021, for H. pylori ( triple therapy with amox 1gm BID, clarithromycin 500 mg twice daily, PPI x14 days) as well as outpatient follow-up with GI. -We will need urease breath test or stool antigen test for H. pylori on follow-up to evaluate for eradication. -Per general surgery.  2.  Squamous cell carcinoma of right supraglottis -Follows with oncology at [facility] [place]. -Status post radiotherapy and concurrent chemotherapy completed February 2022. -Port-A-Cath in place. -Outpatient follow-up with oncology.  3.  Anemia due to antineoplastic chemotherapy -Hemoglobin trickling down currently at 7.3.   -Continue PPI.   -Patient started on treatment for H. pylori.    -Transfusion threshold hemoglobin < 7.  4.  Hypophosphatemia/hypoalbuminemia/ -TPN per pharmacy.  5.  Alcohol use -Stable. -Follow.  6.  1.3 cm right middle lobe nodule -Incidentally noted on CT -Outpatient follow-up with oncology.  7.  Severe protein calorie malnutrition, underweight -TPN. -Tube feeds started at 20 cc an hour per general surgery and being slowly advanced. -Once tube feeds at goal could start weaning TPN per general surgery recommendations.  8.  Generalized debility -PT/OT following recommending home health therapies.     DVT prophylaxis: SCDs Code Status: Full Family Communication: Updated patient.  No family at bedside. Disposition:   Status is: Inpatient  Remains inpatient appropriate because:Inpatient level of care appropriate due to severity of illness  Dispo: The patient is from: Home  Anticipated d/c is to: Home              Patient currently is not medically stable to d/c.   Difficult to place patient No       Consultants:  General surgery:  Dr.Stechschulte 04/12/2021  Procedures:  CT abdomen and pelvis 04/12/2021 Upper GI series pending 04/17/2021 Chest x-ray 04/12/2021 Abdominal film PEG tube location 04/12/2021 Modified Graham patch of duodenal ulcer/gastrostomy tube exchange per general surgery: Dr.Stechschulte 04/12/2021   Antimicrobials:  IV Zosyn 7/29 /2022x1 dose.   Subjective: Patient sitting up in chair.  Still with some abdominal pain however stated just received pain medication which seems to be helping.  No chest pain.  No significant shortness of breath.  Objective: Vitals:   04/19/21 1101 04/19/21 2014 04/20/21 0500 04/20/21 0951  BP: 112/69 106/69  116/70  Pulse: 91 93  88  Resp: '16 18  14  '$ Temp: 99.7 F (37.6 C) 98.8 F (37.1 C)  99.2 F (37.3 C)  TempSrc: Oral Oral  Oral  SpO2: 99% 100%  100%  Weight:   50.1 kg   Height:        Intake/Output Summary (Last 24 hours) at 04/20/2021 1210 Last data filed at 04/20/2021 1012 Gross per 24 hour  Intake 2284.23 ml  Output 1051 ml  Net 1233.23 ml    Filed Weights   04/12/21 0043 04/18/21 0457 04/20/21 0500  Weight: 50.6 kg 47.5 kg 50.1 kg    Examination:  General exam: NAD Respiratory system: Some coarse breath sounds.  No wheezing.  No crackles.  Normal respiratory effort.  Cardiovascular system: Regular rate rhythm no murmurs rubs or gallops.  No JVD.  No lower extremity edema.  Gastrointestinal system: Abdomen is soft, nondistended, some tenderness to palpation in the epigastrium and lower abdomen.  Positive bowel sounds.  Honeycomb dressing intact.  PEG tube intact.  Central nervous system: Alert and oriented.  Moving extremities spontaneously.  No focal neurological deficits. Extremities: Symmetric 5 x 5 power. Skin: No rashes, lesions or ulcers Psychiatry: Judgement and insight appear normal. Mood & affect appropriate.     Data Reviewed: I have personally reviewed following labs and imaging studies  CBC: Recent Labs  Lab 04/14/21 0552  04/15/21 0423 04/16/21 0422 04/18/21 0501 04/20/21 0536  WBC 20.9* 13.8* 10.5 14.3* 14.0*  NEUTROABS  --  12.2*  --  11.8*  --   HGB 7.6* 7.7* 8.2* 8.2* 7.3*  HCT 23.7* 23.3* 25.4* 26.1* 23.0*  MCV 98.8 96.3 97.3 97.8 98.3  PLT 244 238 258 288 295     Basic Metabolic Panel: Recent Labs  Lab 04/15/21 0423 04/16/21 0422 04/17/21 0436 04/18/21 0501 04/19/21 0447 04/20/21 0536  NA 134* 138 135 136 135 135  K 3.7 3.9 4.1 4.3 4.4 4.3  CL 97* 98 101 100 102 101  CO2 '30 30 27 28 28 28  '$ GLUCOSE 157* 146* 160* 165* 147* 163*  BUN '9 9 12 13 19 14  '$ CREATININE 0.30* 0.42* 0.37* 0.34* 0.44* 0.31*  CALCIUM 8.4* 8.8* 8.5* 8.5* 8.4* 8.2*  MG 1.9 1.9 1.7 1.7 2.1  --   PHOS 2.7 3.2 2.9 2.8 3.1  --      GFR: Estimated Creatinine Clearance: 63.5 mL/min (A) (by C-G formula based on SCr of 0.31 mg/dL (L)).  Liver Function Tests: Recent Labs  Lab 04/15/21 0423 04/18/21 0501  AST 12* 12*  ALT 10 13  ALKPHOS 46 71  BILITOT 0.4 0.4  PROT 5.5* 6.3*  ALBUMIN 2.4* 2.5*     CBG: Recent Labs  Lab 04/19/21 1058 04/19/21 1559 04/19/21 2014 04/20/21 0004 04/20/21 0551  GLUCAP 144* 132* 132* 142* 160*      Recent Results (from the past 240 hour(s))  Resp Panel by RT-PCR (Flu A&B, Covid) Nasopharyngeal Swab     Status: None   Collection Time: 04/12/21  5:46 AM   Specimen: Nasopharyngeal Swab; Nasopharyngeal(NP) swabs in vial transport medium  Result Value Ref Range Status   SARS Coronavirus 2 by RT PCR NEGATIVE NEGATIVE Final    Comment: (NOTE) SARS-CoV-2 target nucleic acids are NOT DETECTED.  The SARS-CoV-2 RNA is generally detectable in upper respiratory specimens during the acute phase of infection. The lowest concentration of SARS-CoV-2 viral copies this assay can detect is 138 copies/mL. A negative result does not preclude SARS-Cov-2 infection and should not be used as the sole basis for treatment or other patient management decisions. A negative result may occur with   improper specimen collection/handling, submission of specimen other than nasopharyngeal swab, presence of viral mutation(s) within the areas targeted by this assay, and inadequate number of viral copies(<138 copies/mL). A negative result must be combined with clinical observations, patient history, and epidemiological information. The expected result is Negative.  Fact Sheet for Patients:  EntrepreneurPulse.com.au  Fact Sheet for Healthcare Providers:  IncredibleEmployment.be  This test is no t yet approved or cleared by the [place] FDA and  has been authorized for detection and/or diagnosis of SARS-CoV-2 by FDA under an Emergency Use Authorization (EUA). This EUA will remain  in effect (meaning this test can be used) for the duration of the COVID-19 declaration under Section 564(b)(1) of the Act, 21 U.S.C.section 360bbb-3(b)(1), unless the authorization is terminated  or revoked sooner.       Influenza A by PCR NEGATIVE NEGATIVE Final   Influenza B by PCR NEGATIVE NEGATIVE Final    Comment: (NOTE) The Xpert Xpress SARS-CoV-2/FLU/RSV plus assay is intended as an aid in the diagnosis of influenza from Nasopharyngeal swab specimens and should not be used as a sole basis for treatment. Nasal washings and aspirates are unacceptable for Xpert Xpress SARS-CoV-2/FLU/RSV testing.  Fact Sheet for Patients: EntrepreneurPulse.com.au  Fact Sheet for Healthcare Providers: IncredibleEmployment.be  This test is not yet approved or cleared by the [place] FDA and has been authorized for detection and/or diagnosis of SARS-CoV-2 by FDA under an Emergency Use Authorization (EUA). This EUA will remain in effect (meaning this test can be used) for the duration of the COVID-19 declaration under Section 564(b)(1) of the Act, 21 U.S.C. section 360bbb-3(b)(1), unless the authorization is terminated  or revoked.  Performed at [facility], [place] [address], [place], [place] [address]   Culture, blood (routine x 2)     Status: None   Collection Time: 04/15/21  2:33 PM   Specimen: BLOOD  Result Value Ref Range Status   Specimen Description   Final    BLOOD BLOOD RIGHT FOREARM Performed at [facility] [address], [place], [place] [address]    Special Requests   Final    BOTTLES DRAWN AEROBIC AND ANAEROBIC Blood Culture adequate volume Performed at [facility] [address], [place] [address]    Culture   Final    NO GROWTH 5 DAYS Performed at [facility], [place] [address], [place], [place] [address]    Report Status 04/20/2021 FINAL  Final  Culture, blood (routine x 2)  Status: None   Collection Time: 04/15/21  2:36 PM   Specimen: BLOOD  Result Value Ref Range Status   Specimen Description   Final    BLOOD BLOOD LEFT WRIST Performed at [facility] [address], [place], [place] [address]    Special Requests   Final    BOTTLES DRAWN AEROBIC AND ANAEROBIC Blood Culture adequate volume Performed at [facility] [address] [place], [place] [address]    Culture   Final    NO GROWTH 5 DAYS Performed at [facility], [place] [address], [place], [place] [address]    Report Status 04/20/2021 FINAL  Final          Radiology Studies: No results found.      Scheduled Meds:  acetaminophen (TYLENOL) oral liquid 160 mg/5 mL  650 mg Per Tube QID   amoxicillin  1,000 mg Per Tube Q12H   atropine  1 drop Right Eye BID   Chlorhexidine Gluconate Cloth  6 each Topical Daily   clarithromycin  500 mg Per Tube Q12H   enoxaparin (LOVENOX) injection  30 mg Subcutaneous Q24H   feeding supplement (OSMOLITE 1.5 CAL)  1,000 mL Per Tube Q24H   free water  100 mL Per Tube Q4H   insulin aspart  0-9 Units Subcutaneous Q6H   pantoprazole (PROTONIX) IV  40 mg  Intravenous Q12H   prednisoLONE acetate  1 drop Right Eye QID   Continuous Infusions:  lactated ringers 10 mL/hr at 04/19/21 0436   methocarbamol (ROBAXIN) IV     TPN ADULT (ION)       LOS: 8 days    Time spent: 35 minutes    Irine Seal, MD [facility]   To contact the attending provider between 7A-7P or the covering provider during after hours 7P-7A, please log into the web site [email or web address] and access using universal [place] password for that web site. If you do not have the password, please call the hospital operator.  04/20/2021, 12:10 PM

## 2021-04-20 NOTE — Plan of Care (Signed)
  Problem: Activity: Goal: Risk for activity intolerance will decrease Outcome: Progressing   Problem: Elimination: Goal: Will not experience complications related to bowel motility Outcome: Progressing   Problem: Pain Managment: Goal: General experience of comfort will improve Outcome: Progressing   

## 2021-04-21 DIAGNOSIS — E43 Unspecified severe protein-calorie malnutrition: Secondary | ICD-10-CM | POA: Diagnosis not present

## 2021-04-21 DIAGNOSIS — K9423 Gastrostomy malfunction: Secondary | ICD-10-CM | POA: Diagnosis not present

## 2021-04-21 DIAGNOSIS — K251 Acute gastric ulcer with perforation: Secondary | ICD-10-CM | POA: Diagnosis not present

## 2021-04-21 DIAGNOSIS — D6481 Anemia due to antineoplastic chemotherapy: Secondary | ICD-10-CM | POA: Diagnosis not present

## 2021-04-21 LAB — IRON AND TIBC
Iron: 23 ug/dL — ABNORMAL LOW (ref 45–182)
Saturation Ratios: 11 % — ABNORMAL LOW (ref 17.9–39.5)
TIBC: 219 ug/dL — ABNORMAL LOW (ref 250–450)
UIBC: 196 ug/dL

## 2021-04-21 LAB — GLUCOSE, CAPILLARY
Glucose-Capillary: 100 mg/dL — ABNORMAL HIGH (ref 70–99)
Glucose-Capillary: 111 mg/dL — ABNORMAL HIGH (ref 70–99)
Glucose-Capillary: 123 mg/dL — ABNORMAL HIGH (ref 70–99)
Glucose-Capillary: 129 mg/dL — ABNORMAL HIGH (ref 70–99)
Glucose-Capillary: 133 mg/dL — ABNORMAL HIGH (ref 70–99)
Glucose-Capillary: 160 mg/dL — ABNORMAL HIGH (ref 70–99)

## 2021-04-21 LAB — FOLATE: Folate: 12.5 ng/mL (ref >5.9)

## 2021-04-21 LAB — FERRITIN: Ferritin: 657 ng/mL — ABNORMAL HIGH (ref 24–336)

## 2021-04-21 LAB — PREPARE RBC (CROSSMATCH)

## 2021-04-21 LAB — HEMOGLOBIN AND HEMATOCRIT, BLOOD
HCT: 21.2 % — ABNORMAL LOW (ref 39.0–52.0)
Hemoglobin: 6.8 g/dL — CL (ref 13.0–17.0)

## 2021-04-21 LAB — VITAMIN B12: Vitamin B-12: 1058 pg/mL — ABNORMAL HIGH (ref 180–914)

## 2021-04-21 MED ORDER — DIPHENHYDRAMINE HCL 25 MG PO CAPS
25.0000 mg | ORAL_CAPSULE | Freq: Once | ORAL | Status: AC
  Administered 2021-04-21: 25 mg
  Filled 2021-04-21: qty 1

## 2021-04-21 MED ORDER — SODIUM CHLORIDE 0.9% IV SOLUTION: Freq: Once | INTRAVENOUS | Status: AC

## 2021-04-21 MED ORDER — FUROSEMIDE 10 MG/ML IJ SOLN
20.0000 mg | Freq: Once | INTRAMUSCULAR | Status: AC
  Administered 2021-04-22: 20 mg via INTRAVENOUS
  Filled 2021-04-21: qty 2

## 2021-04-21 MED ORDER — ACETAMINOPHEN 325 MG PO TABS: 650.0000 mg | ORAL_TABLET | Freq: Once | ORAL | Status: AC

## 2021-04-21 NOTE — Progress Notes (Signed)
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  Patient presented to ED with complaints of increasing abdominal pain and bloating around the gastrostomy tube for the last 24 hours PTA.  Also reported vomiting, no diarrhea.  Pain diffusely under the G-tube. 80 CT abdomen showed extravasation of contrast material into the peritoneal cavity of the upper abdomen suggesting leak along the gastrostomy tract Interval summary 7/30-8/2 Patient was admitted with a perforated abdominal ulcer, peritonitis, leaking G-tube, underwent urgent G-tube exchange, Graham patch duodenal ulcer on 7/29 Currently n.p.o., was on IV Zosyn, general surgery following.  On TPN.   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 -Patient with improvement with abdominal pain on current pain regimen. -Patient status post upper GI series which was negative for leak.  -Continue IV Dilaudid as needed as it is controlling pain better. -Continue IV PPI. -There was some concern for PEG tube leakage however reassessed by general surgery and patient to be started on  tube feeds at 20 cc an hour and has been advanced.  -Patient tolerating tube feeds. -H. pylori IgG antibody elevated at 1.85. -Patient started on treatment on 04/19/2021, for H. pylori ( triple therapy with amox 1gm BID, clarithromycin 500 mg twice daily, PPI x14 days) as well as outpatient follow-up with GI. -We will need urease breath test or stool antigen test for H. pylori on follow-up to evaluate for eradication. -Per general surgery.  2.  Squamous cell carcinoma of right supraglottis -Follows with oncology at [facility]. -Status post radiotherapy and concurrent chemotherapy completed February 2022. -Port-A-Cath in place. -Outpatient follow-up with oncology.  3.  Anemia due to antineoplastic chemotherapy -Hemoglobin trickling down currently at 7.3 with repeat H&H at 6.8 this afternoon. -Transfuse 2 units packed red blood cells. -Patient admitted with duodenal perforation with positive H. pylori..   -Continue PPI.   -Patient started on treatment for H. pylori.    -Transfusion threshold hemoglobin < 7.  4.  Hypophosphatemia/hypoalbuminemia/ -TPN discontinued.   -Patient on tube feeds.   -Follow.  5.  Alcohol use -Stable. -Follow.  6.  1.3 cm right middle lobe nodule -Incidentally noted on CT -Outpatient follow-up with oncology.  7.  Severe protein calorie malnutrition, underweight -TPN discontinued. -Tube feeds started at 20 cc an hour per general surgery and has been advanced which patient seems to be tolerating.  8.  Generalized debility -PT/OT.  Will need home health therapies.    DVT prophylaxis: SCDs Code Status: Full Family Communication: Updated patient.  No family at bedside. Disposition:   Status is: Inpatient  Remains inpatient appropriate because:Inpatient level of care appropriate due to severity of illness  Dispo: The patient is from: Home  Anticipated d/c is to: Home              Patient currently is not medically stable to d/c.    Difficult to place patient No       Consultants:  General surgery: Dr.Stechschulte 04/12/2021  Procedures:  CT abdomen and pelvis 04/12/2021 Upper GI series pending 04/17/2021 Chest x-ray 04/12/2021 Abdominal film PEG tube location 04/12/2021 Modified Graham patch of duodenal ulcer/gastrostomy tube exchange per general surgery: Dr.Stechschulte 04/12/2021   Antimicrobials:  IV Zosyn 7/29 /2022x1 dose.   Subjective: Patient laying in bed.  States he is feeling better.  Some improvement with abdominal pain.  No nausea or emesis.    Objective: Vitals:   04/20/21 1358 04/20/21 2036 04/21/21 0500 04/21/21 1218  BP: 128/74 117/76  113/77  Pulse: (!) 103 87  83  Resp: '14 18  17  '$ Temp: 98.9 F (37.2 C) 98.7 F (37.1 C)  98.4 F (36.9 C)  TempSrc: Oral Oral  Oral  SpO2: 99% 100%  100%  Weight:   48.3 kg   Height:        Intake/Output Summary (Last 24 hours) at 04/21/2021 1231 Last data filed at 04/21/2021 1139 Gross per 24 hour  Intake 2685.6 ml  Output 970 ml  Net 1715.6 ml    Filed Weights   04/18/21 0457 04/20/21 0500 04/21/21 0500  Weight: 47.5 kg 50.1 kg 48.3 kg    Examination:  General exam: NAD Respiratory system: Scattered coarse breath sounds.  No wheezing, no crackles, normal respiratory effort.  Cardiovascular system: RRR no murmurs rubs or gallops.  No JVD.  No lower extremity edema.  Gastrointestinal system: Abdomen is soft, nondistended, decreased tenderness to palpation in the epigastrium, lower abdomen.  Positive bowel sounds.  Honeycomb dressing intact.  PEG tube intact. Central nervous system: Alert and oriented.  Moving extremities spontaneously.  No focal neurological deficits. Extremities: Symmetric 5 x 5 power. Skin: No rashes, lesions or ulcers Psychiatry: Judgement and insight appear normal. Mood & affect appropriate.     Data Reviewed: I have personally reviewed following labs and imaging studies  CBC: Recent Labs  Lab 04/15/21 0423  04/16/21 0422 04/18/21 0501 04/20/21 0536  WBC 13.8* 10.5 14.3* 14.0*  NEUTROABS 12.2*  --  11.8*  --   HGB 7.7* 8.2* 8.2* 7.3*  HCT 23.3* 25.4* 26.1* 23.0*  MCV 96.3 97.3 97.8 98.3  PLT 238 258 288 295     Basic Metabolic Panel: Recent Labs  Lab 04/15/21 0423 04/16/21 0422 04/17/21 0436 04/18/21 0501 04/19/21 0447 04/20/21 0536  NA 134* 138 135 136 135 135  K 3.7 3.9 4.1 4.3 4.4 4.3  CL 97* 98 101 100 102 101  CO2 '30 30 27 28 28 28  '$ GLUCOSE 157* 146* 160* 165* 147* 163*  BUN '9 9 12 13 19 14  '$ CREATININE 0.30* 0.42* 0.37* 0.34* 0.44* 0.31*  CALCIUM 8.4* 8.8* 8.5* 8.5* 8.4* 8.2*  MG 1.9 1.9 1.7 1.7 2.1  --   PHOS 2.7 3.2 2.9 2.8 3.1  --      GFR: Estimated Creatinine Clearance: 61.2 mL/min (A) (by C-G formula based on SCr of 0.31 mg/dL (L)).  Liver Function Tests: Recent Labs  Lab 04/15/21 0423 04/18/21 0501  AST 12* 12*  ALT 10 13  ALKPHOS 46 71  BILITOT 0.4 0.4  PROT 5.5* 6.3*  ALBUMIN 2.4* 2.5*     CBG: Recent Labs  Lab 04/20/21 2039 04/21/21 0004 04/21/21 0400 04/21/21 0532 04/21/21 1137  GLUCAP 112* 160* 123* 133* 111*      Recent Results (from the past 240 hour(s))  Resp Panel by RT-PCR (Flu A&B, Covid) Nasopharyngeal Swab     Status: None   Collection Time: 04/12/21  5:46 AM   Specimen: Nasopharyngeal Swab; Nasopharyngeal(NP) swabs in vial transport medium  Result Value Ref Range Status   SARS Coronavirus 2 by RT PCR NEGATIVE NEGATIVE Final    Comment: (NOTE) SARS-CoV-2 target nucleic acids are NOT DETECTED.  The SARS-CoV-2 RNA is generally detectable in upper respiratory specimens during the acute phase of infection. The lowest concentration of SARS-CoV-2 viral copies this assay can detect is 138 copies/mL. A negative result does not preclude SARS-Cov-2 infection and should not be used as the sole basis for treatment or other patient management decisions. A negative result may occur with  improper specimen collection/handling,  submission of specimen other than nasopharyngeal swab, presence of viral mutation(s) within the areas targeted by this assay, and inadequate number of viral copies(<138 copies/mL). A negative result must be combined with clinical observations, patient history, and epidemiological information. The expected result is Negative.  Fact Sheet for Patients:  EntrepreneurPulse.com.au  Fact Sheet for Healthcare Providers:  IncredibleEmployment.be  This test is no t yet approved or cleared by the [place] FDA and  has been authorized for detection and/or diagnosis of SARS-CoV-2 by FDA under an Emergency Use Authorization (EUA). This EUA will remain  in effect (meaning this test can be used) for the duration of the COVID-19 declaration under Section 564(b)(1) of the Act, 21 U.S.C.section 360bbb-3(b)(1), unless the authorization is terminated  or revoked sooner.       Influenza A by PCR NEGATIVE NEGATIVE Final   Influenza B by PCR NEGATIVE NEGATIVE Final    Comment: (NOTE) The Xpert Xpress SARS-CoV-2/FLU/RSV plus assay is intended as an aid in the diagnosis of influenza from Nasopharyngeal swab specimens and should not be used as a sole basis for treatment. Nasal washings and aspirates are unacceptable for Xpert Xpress SARS-CoV-2/FLU/RSV testing.  Fact Sheet for Patients: EntrepreneurPulse.com.au  Fact Sheet for Healthcare Providers: IncredibleEmployment.be  This test is not yet approved or cleared by the [place] FDA and has been authorized for detection and/or diagnosis of SARS-CoV-2 by FDA under an Emergency Use Authorization (EUA). This EUA will remain in effect (meaning this test can be used) for the duration of the COVID-19 declaration under Section 564(b)(1) of the Act, 21 U.S.C. section 360bbb-3(b)(1), unless the authorization is terminated or revoked.  Performed at [facility], [place] [address], [place], [place] [address]   Culture, blood (routine x 2)     Status: None   Collection Time: 04/15/21  2:33 PM   Specimen: BLOOD  Result Value Ref Range Status   Specimen Description   Final    BLOOD BLOOD RIGHT FOREARM Performed at [place] [address], [place], [place] [address]    Special Requests   Final    BOTTLES DRAWN AEROBIC AND ANAEROBIC Blood Culture adequate volume Performed at [place] [address], [place], [place] [address]    Culture   Final    NO GROWTH 5 DAYS Performed at [facility], [place] [address] [place], [place] [address]    Report Status 04/20/2021 FINAL  Final  Culture, blood (routine x 2)     Status: None   Collection Time: 04/15/21  2:36 PM   Specimen: BLOOD  Result Value Ref Range  Status   Specimen Description   Final    BLOOD BLOOD LEFT WRIST Performed at [place] [address], [place], [place] [address]    Special Requests   Final    BOTTLES DRAWN AEROBIC AND ANAEROBIC Blood Culture adequate volume Performed at [place] [address] [place], [place] [address]    Culture   Final    NO GROWTH 5 DAYS Performed at [facility], [place] [address] [place],  [address]    Report Status 04/20/2021 FINAL  Final          Radiology Studies: No results found.      Scheduled Meds:  acetaminophen (TYLENOL) oral liquid 160 mg/5 mL  650 mg Per Tube QID   amoxicillin  1,000 mg Per Tube Q12H   atropine  1 drop Right Eye BID   Chlorhexidine Gluconate Cloth  6 each Topical Daily   clarithromycin  500 mg Per Tube Q12H   enoxaparin (LOVENOX) injection  30 mg Subcutaneous Q24H   feeding supplement (OSMOLITE 1.5 CAL)  1,000 mL Per Tube Q24H   free water  100 mL Per Tube Q4H   guaiFENesin  10 mL Per Tube BID   insulin aspart  0-9 Units Subcutaneous Q6H   pantoprazole (PROTONIX) IV  40 mg Intravenous  Q12H   prednisoLONE acetate  1 drop Right Eye QID   Continuous Infusions:  lactated ringers 10 mL/hr at 04/19/21 0436   methocarbamol (ROBAXIN) IV       LOS: 9 days    Time spent: 35 minutes    Irine Seal, MD [facility]   To contact the attending provider between 7A-7P or the covering provider during after hours 7P-7A, please log into the web site [email or web address] and access using universal [place] password for that web site. If you do not have the password, please call the hospital operator.  04/21/2021, 12:31 PM

## 2021-04-21 NOTE — Plan of Care (Signed)
  Problem: Health Behavior/Discharge Planning: Goal: Ability to manage health-related needs will improve Outcome: Progressing   Problem: Nutrition: Goal: Adequate nutrition will be maintained Outcome: Progressing   Problem: Pain Managment: Goal: General experience of comfort will improve Outcome: Progressing   

## 2021-04-21 NOTE — Plan of Care (Signed)
  Problem: Nutrition: Goal: Adequate nutrition will be maintained Outcome: Progressing   Problem: Coping: Goal: Level of anxiety will decrease Outcome: Progressing   Problem: Pain Managment: Goal: General experience of comfort will improve Outcome: Not Progressing   Problem: Clinical Measurements: Goal: Diagnostic test results will improve Outcome: Not Met (add Reason) Note: Hgb 6.8, will be getting blood transfusion.

## 2021-04-21 NOTE — Progress Notes (Signed)
9 Days Post-Op   Subjective/Chief Complaint: NONE  PT WITHOUT COMPLAINT NO ABDOMINAL PAIN  On TF TNA is off    Objective: Vital signs in last 24 hours: Temp:  [98.7 F (37.1 C)-99.2 F (37.3 C)] 98.7 F (37.1 C) (08/06 2036) Pulse Rate:  [87-103] 87 (08/06 2036) Resp:  [14-18] 18 (08/06 2036) BP: (116-128)/(70-76) 117/76 (08/06 2036) SpO2:  [99 %-100 %] 100 % (08/06 2036) Weight:  [48.3 kg] 48.3 kg (08/07 0500) Last BM Date: 04/20/21  Intake/Output from previous day: 08/06 0701 - 08/07 0700 In: 3162.4 [I.V.:892.6; NG/GT:2169.8] Out: 771 [Urine:750; Drains:20; Stool:1] Intake/Output this shift: No intake/output data recorded.   Abd: soft, appropriately tender, +BS, g-tube in place with no leaking noted.  Tolerating at  65 cc/hr.  JP drain is serous INCISION CDI Dressing intact  Lab Results:  Recent Labs    04/20/21 0536  WBC 14.0*  HGB 7.3*  HCT 23.0*  PLT 295   BMET Recent Labs    04/19/21 0447 04/20/21 0536  NA 135 135  K 4.4 4.3  CL 102 101  CO2 28 28  GLUCOSE 147* 163*  BUN 19 14  CREATININE 0.44* 0.31*  CALCIUM 8.4* 8.2*   PT/INR No results for input(s): LABPROT, INR in the last 72 hours. ABG No results for input(s): PHART, HCO3 in the last 72 hours.  Invalid input(s): PCO2, PO2  Studies/Results: No results found.  Anti-infectives: Anti-infectives (From admission, onward)    Start     Dose/Rate Route Frequency Ordered Stop   04/19/21 2200  amoxicillin (AMOXIL) 250 MG/5ML suspension 1,000 mg        1,000 mg Per Tube Every 12 hours 04/19/21 2023 05/03/21 2159   04/19/21 2200  clarithromycin (BIAXIN) 250 MG/5ML suspension 500 mg        500 mg Per Tube Every 12 hours 04/19/21 2023 05/03/21 2159   04/12/21 1200  piperacillin-tazobactam (ZOSYN) IVPB 3.375 g  Status:  Discontinued        3.375 g 12.5 mL/hr over 240 Minutes Intravenous Every 8 hours 04/12/21 0920 04/17/21 1453   04/12/21 0600  piperacillin-tazobactam (ZOSYN) IVPB 3.375 g         3.375 g 100 mL/hr over 30 Minutes Intravenous  Once 04/12/21 0549 04/12/21 [id number]       Assessment/Plan: s/p Procedure(s): EXPLORATORY LAPAROTOMY, REPAIR OF DUODENAL ULCER WITH GRAHAM PATCH (N/A) POD 9  s/p ex lap graham patich and G tube exchange by Dr. Lanny Hurst 7/29 for perforated duodenal ulcer - TF at goal   - UGI negative - H. Pylori +, will need treatment for this for 14 days as well as GI follow up as outpatient   FEN: NPO,   ID: zosyn 7/28>> stopped VTE: SCDs, Lovenox   SCC of R supraglottis Anemia Alcohol use Tobacco abuse - advised cessation R middle lobe lung nodule Severe Protein calorie malnutrition - prealbumin <5 8/1  LOS: 9 days    Turner Daniels MD  04/21/2021

## 2021-04-21 NOTE — Progress Notes (Signed)
Waiting on blood blank for blood readiness, called to verify and was informed by blood band that blood is ready to be picked up, night shift RN notified to F/U with plan of care.

## 2021-04-21 NOTE — Progress Notes (Signed)
Waiting for type and screen from blood bank.

## 2021-04-22 LAB — HEMOGLOBIN AND HEMATOCRIT, BLOOD
HCT: 30.1 % — ABNORMAL LOW (ref 39.0–52.0)
Hemoglobin: 10 g/dL — ABNORMAL LOW (ref 13.0–17.0)

## 2021-04-22 LAB — GLUCOSE, CAPILLARY
Glucose-Capillary: 130 mg/dL — ABNORMAL HIGH (ref 70–99)
Glucose-Capillary: 136 mg/dL — ABNORMAL HIGH (ref 70–99)
Glucose-Capillary: 108 mg/dL — ABNORMAL HIGH (ref 70–99)

## 2021-04-22 MED ORDER — LIP MEDEX EX OINT
1.0000 | TOPICAL_OINTMENT | CUTANEOUS | Status: DC | PRN
Start: 2021-04-22 — End: 2021-04-22
  Administered 2021-04-22: 1 via TOPICAL
  Filled 2021-04-22: qty 7

## 2021-04-22 MED ORDER — GUAIFENESIN 100 MG/5ML PO SOLN: 10.0000 mL | Freq: Two times a day (BID) | ORAL | 0 refills | Status: DC

## 2021-04-22 MED ORDER — CLARITHROMYCIN 250 MG/5ML PO SUSR: 500.0000 mg | Freq: Two times a day (BID) | ORAL | 0 refills | Status: AC

## 2021-04-22 MED ORDER — AMOXICILLIN 250 MG/5ML PO SUSR: 1000.0000 mg | Freq: Two times a day (BID) | ORAL | 0 refills | Status: AC

## 2021-04-22 MED ORDER — FREE WATER: 100.0000 mL | Status: AC

## 2021-04-22 MED ORDER — DEXAMETHASONE 4 MG PO TABS
8.0000 mg | ORAL_TABLET | ORAL | Status: DC
Start: 2021-04-22 — End: 2021-06-06

## 2021-04-22 MED ORDER — OSMOLITE 1.5 CAL PO LIQD: 1000.0000 mL | ORAL | 0 refills | Status: DC

## 2021-04-22 MED ORDER — PANTOPRAZOLE SODIUM 40 MG PO PACK: 40.0000 mg | PACK | Freq: Two times a day (BID) | ORAL | 1 refills | Status: DC

## 2021-04-22 MED ORDER — OXYCODONE HCL 5 MG/5ML PO SOLN: 5.0000 mg | Freq: Four times a day (QID) | ORAL | 0 refills | Status: AC | PRN

## 2021-04-22 MED ORDER — ORAL CARE MOUTH RINSE
15.0000 mL | Freq: Two times a day (BID) | OROMUCOSAL | Status: DC
  Administered 2021-04-22: 15 mL via OROMUCOSAL

## 2021-04-22 MED ORDER — LIDOCAINE-PRILOCAINE 2.5-2.5 % EX CREA
1.0000 | TOPICAL_CREAM | Freq: Every day | CUTANEOUS | Status: DC | PRN
Start: 2021-04-22 — End: 2021-06-06

## 2021-04-22 MED ORDER — KATE FARMS STANDARD 1.4 EN LIQD: 487.0000 mL | Freq: Three times a day (TID) | ENTERAL | Status: AC

## 2021-04-22 MED ORDER — OSMOLITE 1.5 CAL PO LIQD: ORAL | 10 refills | Status: DC

## 2021-04-22 MED ORDER — PANTOPRAZOLE SODIUM 40 MG PO PACK: 40.0000 mg | PACK | Freq: Two times a day (BID) | ORAL | Status: DC

## 2021-04-22 MED ORDER — HEPARIN SOD (PORK) LOCK FLUSH 100 UNIT/ML IV SOLN
500.0000 [IU] | INTRAVENOUS | Status: AC | PRN
  Administered 2021-04-22: 500 [IU]
  Filled 2021-04-22: qty 5

## 2021-04-22 MED ORDER — CHLORHEXIDINE GLUCONATE 0.12 % MT SOLN
15.0000 mL | Freq: Two times a day (BID) | OROMUCOSAL | Status: DC
  Administered 2021-04-22: 15 mL via OROMUCOSAL
  Filled 2021-04-22: qty 15

## 2021-04-22 NOTE — Discharge Instructions (Signed)
[facility]      [facility] [phone]  OPEN ABDOMINAL SURGERY: POST OP INSTRUCTIONS  Always review your discharge instruction sheet given to you by the facility where your surgery was performed.  IF YOU HAVE DISABILITY OR FAMILY LEAVE FORMS, YOU MUST BRING THEM TO THE OFFICE FOR PROCESSING.  PLEASE DO NOT GIVE THEM TO YOUR DOCTOR.  A prescription for pain medication may be given to you upon discharge.  Take your pain medication as prescribed, if needed.  If narcotic pain medicine is not needed, then you may take acetaminophen (Tylenol) or ibuprofen (Advil) as needed. Take your usually prescribed medications unless otherwise directed. If you need a refill on your pain medication, please contact your pharmacy. They will contact our office to request authorization.  Prescriptions will not be filled after 5pm or on week-ends. You should follow a light diet the first few days after arrival home, such as soup and crackers, pudding, etc.unless your doctor has advised otherwise. A high-fiber, low fat diet can be resumed as tolerated.   Be sure to include lots of fluids daily. Most patients will experience some swelling and bruising on the chest and neck area.  Ice packs will help.  Swelling and bruising can take several days to resolve Most patients will experience some swelling and bruising in the area of the incision. Ice pack will help. Swelling and bruising can take several days to resolve..  It is common to experience some constipation if taking pain medication after surgery.  Increasing fluid intake and taking a stool softener will usually help or prevent this problem from occurring.  A mild laxative (Milk of Magnesia or Miralax) should be taken according to package directions if there are no bowel movements after 48 hours.  You may have steri-strips (small skin tapes) in place directly over the incision.  These strips should be left on the skin for 7-10 days.  If your surgeon used skin  glue on the incision, you may shower in 24 hours.  The glue will flake off over the next 2-3 weeks.  Any sutures or staples will be removed at the office during your follow-up visit. You may find that a light gauze bandage over your incision may keep your staples from being rubbed or pulled. You may shower and replace the bandage daily. ACTIVITIES:  You may resume regular (light) daily activities beginning the next day--such as daily self-care, walking, climbing stairs--gradually increasing activities as tolerated.  You may have sexual intercourse when it is comfortable.  Refrain from any heavy lifting or straining until approved by your doctor. You may drive when you no longer are taking prescription pain medication, you can comfortably wear a seatbelt, and you can safely maneuver your car and apply brakes Return to Work: ___________________________________ You should see your doctor in the office for a follow-up appointment approximately two weeks after your surgery.  Make sure that you call for this appointment within a day or two after you arrive home to insure a convenient appointment time. OTHER INSTRUCTIONS:  _____________________________________________________________ _____________________________________________________________  WHEN TO CALL YOUR DOCTOR: Fever over 101.0 Inability to urinate Nausea and/or vomiting Extreme swelling or bruising Continued bleeding from incision. Increased pain, redness, or drainage from the incision. Difficulty swallowing or breathing Muscle cramping or spasms. Numbness or tingling in hands or feet or around lips.  The clinic staff is available to answer your questions during regular business hours.  Please don't hesitate to call and ask to speak to one of   the nurses if you have concerns.  For further questions, please visit [email or web address]  

## 2021-04-22 NOTE — Progress Notes (Signed)
NUTRITION NOTE  Consult received for recommendations for home TF regimen for patient who is stable for d/c.Marland Kitchen  Patient last seen by this RD on 8/5. He is now receiving TF at goal rate: Osmolite 1.5 @ 60 ml/hr.  Patient has used [facility] in the past for TF and has used both Osmolite 1.5 and [employer] 1.4; TOC reports [facility] will continue to provide TF and will be providing [employer] 1.4.  Patient does bolus regimen at home of 1.5 cartons (487 ml) at a time. Recommendation for home TF regimen:  1.5 cartons (487 ml) [employer] 1.4 TID with 60 ml free water before and 60 ml free water after each TF bolus. Provide an additional 120 ml free water TID.   This regimen will provide 2047 kcal, 90 grams protein, and 1773 ml free water.        Jarome Matin, MS, RD, LDN, CNSC Inpatient Clinical Dietitian RD pager # available in [place]  After hours/weekend pager # available in [facility]

## 2021-04-22 NOTE — Progress Notes (Signed)
PIV removed. Port-a-cath deaccessed by IV team. All belongings packed. Pt dressed himself. Dressings CDI. D/c instructions reviewed with pt and sister, [name]. Denies questions/concerns. Pt transported via w/c to front entrance to travel with sister via car.

## 2021-04-22 NOTE — Plan of Care (Signed)
  Problem: Clinical Measurements: Goal: Will remain free from infection Outcome: Progressing Goal: Diagnostic test results will improve Outcome: Progressing   Problem: Nutrition: Goal: Adequate nutrition will be maintained Outcome: Progressing   Problem: Pain Managment: Goal: General experience of comfort will improve Outcome: Progressing   Problem: Clinical Measurements: Goal: Respiratory complications will improve Outcome: Adequate for Discharge

## 2021-04-22 NOTE — TOC Transition Note (Signed)
Transition of Care [facility]) - CM/SW Discharge Note   Patient Details  Name: [name] MRN: [id number] Date of Birth: Dec 30, 1950  Transition of Care [facility]) CM/SW Contact:  Lennart Pall, LCSW Phone Number: 04/22/2021, 4:12 PM   Clinical Narrative:    Alerted by MD that pt medically cleared for dc.  Have alerted [facility] who will be providing HHRN/ PT services.  [facility] will continue to supply tube feedings.  Left a VM for pt's sister about pending dc today. No further TOC needs.   Final next level of care: [place] Barriers to Discharge: Barriers Resolved   Patient Goals and CMS Choice Patient states their goals for this hospitalization and ongoing recovery are:: go home CMS Medicare.gov Compare Post Acute Care list provided to:: Patient Represenative (must comment) (sister Daisy [phone]) Choice offered to / list presented to : Sibling  Discharge Placement                       Discharge Plan and Services   Discharge Planning Services: CM Consult Post Acute Care Choice: Home Health                    HH Arranged: PT, RN [facility] Agency: [facility] Date Uh [facility] Agency Contacted: 04/16/21 Time [place]: 0908 Representative spoke with at [place]: [place] ([place]) Interventions     Readmission Risk Interventions No flowsheet data found.

## 2021-04-22 NOTE — Progress Notes (Signed)
Progress Note  10 Days Post-Op  Subjective: Feeling well. Abdominal pain remains improved. No nausea or emesis  Objective: Vital signs in last 24 hours: Temp:  [98 F (36.7 C)-99.3 F (37.4 C)] 98.5 F (36.9 C) (08/08 0417) Pulse Rate:  [82-86] 84 (08/08 0417) Resp:  [15-18] 15 (08/08 0417) BP: (113-135)/(71-94) 128/94 (08/08 0417) SpO2:  [100 %] 100 % (08/08 0417) Weight:  [47.5 kg] 47.5 kg (08/08 0500) Last BM Date: 04/20/21  Intake/Output from previous day: 08/07 0701 - 08/08 0700 In: 3071.9 [I.V.:169.9; Blood:812; NG/GT:1940] Out: 1660 [id number]; Drains:20] Intake/Output this shift: Total I/O In: 240 [I.V.:20; NG/GT:220] Out: 200 [Urine:200]  PE: General: pleasant, WD, male who is laying in bed in NAD HEENT: head is normocephalic, atraumatic.  Mouth is pink and moist Heart: regular, rate, and rhythm.  Palpable radial pulses Lungs: Respiratory effort nonlabored Abd: soft, ND, +BS, JP drain RLQ without output. Midline incision with honeycomb bandage - staples c/d/I. G tube with tube feeds connected MSK: no calf TTP bilaterally Skin: warm and dry with no masses, lesions, or rashes Psych: Alert with an appropriate affect.    Lab Results:  Recent Labs    04/20/21 0536 04/21/21 1420 04/22/21 0820  WBC 14.0*  --   --   HGB 7.3* 6.8* 10.0*  HCT 23.0* 21.2* 30.1*  PLT 295  --   --    BMET Recent Labs    04/20/21 0536  NA 135  K 4.3  CL 101  CO2 28  GLUCOSE 163*  BUN 14  CREATININE 0.31*  CALCIUM 8.2*   PT/INR No results for input(s): LABPROT, INR in the last 72 hours. CMP     Component Value Date/Time   NA 135 04/20/2021 0536   K 4.3 04/20/2021 0536   CL 101 04/20/2021 0536   CO2 28 04/20/2021 0536   GLUCOSE 163 (H) 04/20/2021 0536   BUN 14 04/20/2021 0536   CREATININE 0.31 (L) 04/20/2021 0536   CREATININE 0.67 03/26/2021 1159   CREATININE 0.61 (L) 10/26/2019 1523   CALCIUM 8.2 (L) 04/20/2021 0536   PROT 6.3 (L) 04/18/2021 0501   ALBUMIN  2.5 (L) 04/18/2021 0501   AST 12 (L) 04/18/2021 0501   AST 12 (L) 03/26/2021 1159   ALT 13 04/18/2021 0501   ALT 12 03/26/2021 1159   ALT 53 (H) 09/06/2019 1543   ALKPHOS 71 04/18/2021 0501   BILITOT 0.4 04/18/2021 0501   BILITOT <0.2 (L) 03/26/2021 1159   GFRNONAA >60 04/20/2021 0536   GFRNONAA >60 03/26/2021 1159   GFRAA >60 01/03/2020 1355   GFRAA >60 05/04/2019 1207   Lipase     Component Value Date/Time   LIPASE 72 (H) 04/12/2021 0147       Studies/Results: No results found.  Anti-infectives: Anti-infectives (From admission, onward)    Start     Dose/Rate Route Frequency Ordered Stop   04/19/21 2200  amoxicillin (AMOXIL) 250 MG/5ML suspension 1,000 mg        1,000 mg Per Tube Every 12 hours 04/19/21 2023 05/03/21 2159   04/19/21 2200  clarithromycin (BIAXIN) 250 MG/5ML suspension 500 mg        500 mg Per Tube Every 12 hours 04/19/21 2023 05/03/21 2159   04/12/21 1200  piperacillin-tazobactam (ZOSYN) IVPB 3.375 g  Status:  Discontinued        3.375 g 12.5 mL/hr over 240 Minutes Intravenous Every 8 hours 04/12/21 0920 04/17/21 1453   04/12/21 0600  piperacillin-tazobactam (ZOSYN) IVPB 3.375 g  3.375 g 100 mL/hr over 30 Minutes Intravenous  Once 04/12/21 0549 04/12/21 [id number]        Assessment/Plan  s/p Procedure(s): EXPLORATORY LAPAROTOMY, REPAIR OF DUODENAL ULCER WITH GRAHAM PATCH (N/A) POD 10  s/p ex lap graham patich and G tube exchange by Dr. Lanny Hurst 7/29 for perforated duodenal ulcer - TF at goal  - UGI negative - H. Pylori +, will need treatment for this for 14 days as well as GI follow up as outpatient   FEN: NPO, TF ID: zosyn 7/28>> stopped VTE: SCDs, Lovenox   SCC of R supraglottis Anemia Alcohol use Tobacco abuse - advised cessation R middle lobe lung nodule Severe Protein calorie malnutrition - prealbumin <5 8/1   Disposition - stable for discharge from surgical perspective. JP drain to be removed prior to discharge    LOS: 10  days    Winferd Humphrey, [facility] 04/22/2021, 10:56 AM Please see Amion for pager number during day hours 7:00am-4:30pm

## 2021-04-22 NOTE — Discharge Summary (Signed)
Physician Discharge Summary  [name] [id number] DOB: 02-27-1951 DOA: 04/12/2021  PCP: Alan Ebbs, MD  Admit date: 04/12/2021 Discharge date: 04/22/2021  Admitted From: home Discharge disposition: home   Recommendations for Outpatient Follow-Up:   Tube feeds: 1.5 cartons (487 ml) [name] 1.4 TID with 60 ml free water before and 60 ml free water after each TF bolus. Provide an additional 120 ml free water TID. 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    Wound care: None.  Code status: Full.   History of Present Illness:   [name] is a 70 y.o. 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  Has been having some vomiting.  Denies any diarrhea.  Pain is diffusely present and more around the gastrostomy tube.   Hospital Course by Problem:   abdominal pain secondary to perforated duodenal ulcer, peritonitis -Status post ex lap [name] patch and G-tube exchange per Dr.Stechshulte 04/12/2021 -Patient currently n.p.o -Patient status post upper GI series which was negative for leak. -Continue PPI. -Patient tolerating tube feeds. -H. pylori IgG antibody elevated at 1.85. -Patient started on treatment on 04/19/2021, for H. pylori ( triple therapy with amox 1gm  BID, clarithromycin 500 mg twice daily, PPI x14 days) as well as outpatient follow-up with GI. -We will need urease breath test or stool antigen test for H. pylori on follow-up to evaluate for eradication. -general surgery: stable for discharge from surgical perspective. JP drain to be removed prior to discharge  Squamous cell carcinoma of right supraglottis -Follows with oncology at [name] post radiotherapy and concurrent chemotherapy completed February 2022. -Port-A-Cath in place. -Outpatient follow-up with oncology.  Anemia due to antineoplastic chemotherapy -s/p 2 units packed red blood cells.  Hypophosphatemia/hypoalbuminemia/ -TPN discontinued.   -Patient on tube feeds.    Alcohol use -Stable. -encouraged cessation  1.3 cm right middle lobe nodule -Incidentally noted on CT -Outpatient follow-up with oncology.   Severe protein calorie malnutrition, underweight -tube feeds as above   Generalized debility -[name].  Will need home health therapies.      Medical Consultants:    General surgery  Discharge Exam:   Vitals:   04/22/21 0417 04/22/21 1201  BP: (!) 128/94 113/72  Pulse: 84 84  Resp: 15 17  Temp: 98.5 F (36.9 C) 98.6 F (37 C)  SpO2: 100% 100%   Vitals:   04/22/21 0145 04/22/21 0417 04/22/21 0500 04/22/21 1201  BP: 122/83 (!) 128/94  113/72  Pulse: 85 84  84  Resp: '15 15  17  '$ Temp: 98 F (36.7 C) 98.5 F (36.9 C)  98.6 F (37 C)  TempSrc: Oral Oral  Oral  SpO2: 100% 100%  100%  Weight:   47.5 kg   Height:        General exam: Appears calm  and comfortable. Asking to go home   The results of significant diagnostics from this hospitalization (including imaging, microbiology, ancillary and laboratory) are listed below for reference.     Procedures and Diagnostic Studies:   DG Chest 1 View  Result Date: 04/12/2021 CLINICAL DATA:  History of throat cancer with abdominal pain. EXAM: CHEST  1 VIEW COMPARISON:  January 10, 2021  FINDINGS: There is stable left-sided venous Port-A-Cath positioning. The heart size and mediastinal contours are within normal limits. Both lungs are clear. Radiopaque contrast is seen within the gastric lumen. The visualized skeletal structures are unremarkable. IMPRESSION: No active disease. Electronically Signed   By: Virgina Norfolk M.D.   On: 04/12/2021 03:35   CT ABDOMEN PELVIS W CONTRAST  Result Date: 04/12/2021 CLINICAL DATA:  Nausea, vomiting, blood in gastrostomy catheter EXAM: CT ABDOMEN AND PELVIS WITH CONTRAST TECHNIQUE: Multidetector CT imaging of the abdomen and pelvis was performed using the standard protocol following bolus administration of intravenous contrast. CONTRAST:  41m OMNIPAQUE IOHEXOL 350 MG/ML SOLN COMPARISON:  PET-CT 01/31/2021, and previous FINDINGS: Lower chest: No pleural or pericardial effusion. 1.3 cm pleural-based nodule in the right middle lobe, new since previous. Hepatobiliary: No focal liver abnormality is seen. No gallstones, gallbladder wall thickening, or biliary dilatation. Pancreas: Unremarkable. No pancreatic ductal dilatation or surrounding inflammatory changes. Spleen: Normal in size without focal abnormality. Adrenals/Urinary Tract: Adrenal glands unremarkable. 1 cm probable cyst, mid left kidney. No hydronephrosis. Contrast material in the renal collecting systems and nondistended urinary bladder. Stomach/Bowel: The gastrostomy tube projects in expected location. There is dense contrast in the partially distended stomach. Small bowel is decompressed, with incomplete distal passage of oral contrast material. The colon is decompressed, unremarkable. Vascular/Lymphatic: Scattered calcified aortoiliac plaque without aneurysm or evident stenosis. Portal vein patent. No abdominal or pelvic adenopathy. Reproductive: Prostate is unremarkable. Other: There is a moderate amount of high attenuation fluid in the upper and anterior abdomen suggesting leak of oral contrast  material, presumably from along the gastrostomy tract although there is no definite hyperdense leak adjacent to the gastrostomy catheter. Small volume fluid-attenuation pelvic ascites without evident loculation. No free air. Musculoskeletal: Mild L3 compression deformity, stable since 11/10/2019. Multilevel lumbar spondylitic change. No acute fracture or worrisome bone lesion. IMPRESSION: 1. Contrast material in the peritoneal cavity of the upper abdomen suggesting leak along the gastrostomy tract. Critical Value/emergent results were called by telephone at the time of interpretation on 04/12/2021 at 5:38 am [facility] who verbally acknowledged these results. 2. Gastrostomy tube is appropriately positioned. No evidence of abscess. 3. 1.3 cm right middle lobe nodule, new since 01/31/2021. Favor infectious/inflammatory over primary/secondary neoplasm given the relatively short time frame of development. Electronically Signed   By: DLucrezia EuropeM.D.   On: 04/12/2021 05:41   DG ABDOMEN PEG TUBE LOCATION  Result Date: 04/12/2021 CLINICAL DATA:  Peg tube location. EXAM: ABDOMEN - 1 VIEW COMPARISON:  None. FINDINGS: A percutaneous gastrostomy tube is seen with its distal tip and insufflator bulb overlying left upper quadrant. Following injection of 30 mL of Omnipaque into the peg tube, radiopaque contrast is seen throughout the gastric lumen. No contrast extravasation is identified. The bowel gas pattern is normal. No radio-opaque calculi are seen. IMPRESSION: Percutaneous gastrostomy tube positioned within the body of the stomach. Electronically Signed   By: TVirgina NorfolkM.D.   On: 04/12/2021 02:38   [place]KG SITE RITE  Result Date: 04/12/2021 If Site Rite image not attached, placement could not  be confirmed due to current cardiac rhythm.    Labs:   Basic Metabolic Panel: Recent Labs  Lab 04/16/21 0422 04/17/21 0436 04/18/21 0501 04/19/21 0447 04/20/21 0536  NA 138 135 136 135 135  K  3.9 4.1 4.3 4.4 4.3  CL 98 101 100 102 101  CO2 '30 27 28 28 28  '$ GLUCOSE 146* 160* 165* 147* 163*  BUN '9 12 13 19 14  '$ CREATININE 0.42* 0.37* 0.34* 0.44* 0.31*  CALCIUM 8.8* 8.5* 8.5* 8.4* 8.2*  MG 1.9 1.7 1.7 2.1  --   PHOS 3.2 2.9 2.8 3.1  --    GFR Estimated Creatinine Clearance: 60.2 mL/min (A) (by C-G formula based on SCr of 0.31 mg/dL (L)). Liver Function Tests: Recent Labs  Lab 04/18/21 0501  AST 12*  ALT 13  ALKPHOS 71  BILITOT 0.4  PROT 6.3*  ALBUMIN 2.5*   No results for input(s): LIPASE, AMYLASE in the last 168 hours. No results for input(s): AMMONIA in the last 168 hours. Coagulation profile No results for input(s): INR, PROTIME in the last 168 hours.  CBC: Recent Labs  Lab 04/16/21 0422 04/18/21 0501 04/20/21 0536 04/21/21 1420 04/22/21 0820  WBC 10.5 14.3* 14.0*  --   --   NEUTROABS  --  11.8*  --   --   --   HGB 8.2* 8.2* 7.3* 6.8* 10.0*  HCT 25.4* 26.1* 23.0* 21.2* 30.1*  MCV 97.3 97.8 98.3  --   --   PLT 258 288 295  --   --    Cardiac Enzymes: No results for input(s): CKTOTAL, CKMB, CKMBINDEX, TROPONINI in the last 168 hours. BNP: Invalid input(s): POCBNP CBG: Recent Labs  Lab 04/21/21 1712 04/21/21 2347 04/22/21 0602 04/22/21 0729 04/22/21 1158  GLUCAP 100* 129* 130* 136* 108*   D-Dimer No results for input(s): DDIMER in the last 72 hours. Hgb A1c No results for input(s): HGBA1C in the last 72 hours. Lipid Profile No results for input(s): CHOL, HDL, LDLCALC, TRIG, CHOLHDL, LDLDIRECT in the last 72 hours. Thyroid function studies No results for input(s): TSH, T4TOTAL, T3FREE, THYROIDAB in the last 72 hours.  Invalid input(s): FREET3 Anemia work up Recent Labs    04/21/21 1615  VITAMINB12 1,058*  FOLATE 12.5  FERRITIN 657*  TIBC 219*  IRON 23*   Microbiology Recent Results (from the past 240 hour(s))  Culture, blood (routine x 2)     Status: None   Collection Time: 04/15/21  2:33 PM   Specimen: BLOOD  Result Value Ref  Range Status   Specimen Description   Final    BLOOD BLOOD RIGHT FOREARM Performed at [facility], [place] [address] [place], [place] [address]    Special Requests   Final    BOTTLES DRAWN AEROBIC AND ANAEROBIC Blood Culture adequate volume Performed at [facility] [address], [place], [place] [address]    Culture   Final    NO GROWTH 5 DAYS Performed at [facility], [place] [address] [place], [place] [address]    Report Status 04/20/2021 FINAL  Final  Culture, blood (routine x 2)     Status: None   Collection Time: 04/15/21  2:36 PM   Specimen: BLOOD  Result Value Ref Range Status   Specimen Description   Final    BLOOD BLOOD LEFT WRIST Performed at [facility] [address], [place], [place] [address]    Special Requests   Final  BOTTLES DRAWN AEROBIC AND ANAEROBIC Blood Culture adequate volume Performed at [facility] [address], [place], [place] [address]    Culture   Final    NO GROWTH 5 DAYS Performed at [facility], [place] [address] [place], [place] [address]    Report Status 04/20/2021 FINAL  Final     Discharge Instructions:   Discharge Instructions     Ambulatory referral to Gastroenterology   Complete by: As directed    What is the reason for referral?: Other Comment - ulcer follo wup   Increase activity slowly   Complete by: As directed    No wound care   Complete by: As directed       Allergies as of 04/22/2021   No Known Allergies      Medication List     STOP taking these medications    losartan 50 MG tablet Commonly known as: COZAAR   oxyCODONE 5 MG immediate release tablet Commonly known as: Oxy IR/ROXICODONE Replaced by: oxyCODONE 5 MG/5ML solution       TAKE these medications    amoxicillin 250 MG/5ML suspension Commonly known as: AMOXIL Place 20 mLs (1,000 mg total) into feeding tube every 12 (twelve) hours for 10 days.    atropine 1 % ophthalmic solution Place 1 drop into the right eye 2 (two) times a day.   clarithromycin 250 MG/5ML suspension Commonly known as: BIAXIN Place 10 mLs (500 mg total) into feeding tube every 12 (twelve) hours for 10 days.   dexamethasone 4 MG tablet Commonly known as: DECADRON Take 2 tablets (8 mg total) by mouth See admin instructions. Start the day before Taxotere, take twice daily, then daily after chemo for 2 days. What changed:  when to take this additional instructions Another medication with the same name was removed. Continue taking this medication, and follow the directions you see here.   free water Soln Place 100 mLs into feeding tube every 4 (four) hours.   guaiFENesin 100 MG/5ML Soln Commonly known as: ROBITUSSIN Place 10 mLs (200 mg total) into feeding tube 2 (two) times daily.   [name] Standard 1.4 Liqd 487 mLs by Enteral route 3 (three) times daily. 1.5 cartons (487 ml) [employer] 1.4 TID with 60 ml free water before and 60 ml free water after each TF bolus. Provide an additional 120 ml free water TID. What changed:  how much to take when to take this additional instructions   lidocaine 2 % solution Commonly known as: XYLOCAINE Use as directed 10 mLs in the mouth or throat every 6 (six) hours as needed for mouth pain. Mix 1 part of lidocaine with 1 part of water, swish and swallow   lidocaine-prilocaine cream Commonly known as: EMLA Apply 1 application topically daily as needed (access port).   ondansetron 8 MG tablet Commonly known as: Zofran Take 1 tablet (8 mg total) by mouth 2 (two) times daily as needed for refractory nausea / vomiting.   oxyCODONE 5 MG/5ML solution Commonly known as: ROXICODONE Place 5 mLs (5 mg total) into feeding tube every 6 (six) hours as needed for up to 5 days for moderate pain or severe pain. Replaces: oxyCODONE 5 MG immediate release tablet   pantoprazole sodium 40 mg/20 mL Pack Commonly known as:  PROTONIX Place 20 mLs (40 mg total) into feeding tube 2 (two) times daily.   prednisoLONE acetate 1 % ophthalmic suspension Commonly known as: PRED FORTE Place 1 drop into the right  eye 4 (four) times daily.   prochlorperazine 10 MG tablet Commonly known as: COMPAZINE Take 1 tablet (10 mg total) by mouth every 6 (six) hours as needed (Nausea or vomiting).   Vitamin D (Ergocalciferol) 1.25 MG (50000 UNIT) Caps capsule Commonly known as: DRISDOL Take 50,000 Units by mouth every Saturday.        Follow-up Information     Stechschulte, Nickola Major, MD Follow up on 05/09/2021.   Specialty: Surgery Why: please follow up on 8/25 at 10:40 am for post operative appointment. Please arrive 30 minutes early to complete check in process Contact information: [place] [place] [address] [phone]                  Time coordinating discharge: 35 min  Signed:  [name] DO  [facility] 04/22/2021, 2:48 PM

## 2021-04-22 NOTE — Care Management Important Message (Signed)
Important Message  Patient Details IM Letter placed in Patients room. Name: [name] MRN: [id number] Date of Birth: 1951/06/28   Medicare Important Message Given:  Yes     Kerin Salen 04/22/2021, 4:03 PM

## 2021-04-23 LAB — TYPE AND SCREEN
ABO/RH(D): A POS
Antibody Screen: NEGATIVE
Unit division: 0
Unit division: 0

## 2021-04-23 LAB — BPAM RBC
Blood Product Expiration Date: 202208202359
Blood Product Expiration Date: 202208202359
ISSUE DATE / TIME: 202208072058
ISSUE DATE / TIME: 202208080123
Unit Type and Rh: 6200
Unit Type and Rh: 6200

## 2021-05-01 ENCOUNTER — Inpatient Hospital Stay: Payer: Medicare HMO

## 2021-05-01 ENCOUNTER — Ambulatory Visit: Payer: Medicare HMO | Admitting: Hematology and Oncology

## 2021-05-01 ENCOUNTER — Other Ambulatory Visit: Payer: Self-pay

## 2021-05-01 ENCOUNTER — Encounter: Payer: Self-pay | Admitting: Hematology and Oncology

## 2021-05-01 ENCOUNTER — Inpatient Hospital Stay (HOSPITAL_BASED_OUTPATIENT_CLINIC_OR_DEPARTMENT_OTHER): Payer: Medicare HMO | Admitting: Hematology and Oncology

## 2021-05-01 ENCOUNTER — Inpatient Hospital Stay: Payer: Medicare HMO | Attending: Hematology and Oncology

## 2021-05-01 VITALS — BP 143/96 | HR 95 | Temp 97.8°F | Resp 18 | Wt 103.6 lb

## 2021-05-01 DIAGNOSIS — C329 Malignant neoplasm of larynx, unspecified: Secondary | ICD-10-CM | POA: Insufficient documentation

## 2021-05-01 DIAGNOSIS — Z95828 Presence of other vascular implants and grafts: Secondary | ICD-10-CM

## 2021-05-01 DIAGNOSIS — Z79899 Other long term (current) drug therapy: Secondary | ICD-10-CM | POA: Insufficient documentation

## 2021-05-01 DIAGNOSIS — C321 Malignant neoplasm of supraglottis: Secondary | ICD-10-CM | POA: Diagnosis not present

## 2021-05-01 DIAGNOSIS — C090 Malignant neoplasm of tonsillar fossa: Secondary | ICD-10-CM | POA: Diagnosis not present

## 2021-05-01 LAB — CBC WITH DIFFERENTIAL (CANCER CENTER ONLY)
Abs Immature Granulocytes: 0.1 10*3/uL — ABNORMAL HIGH (ref 0.00–0.07)
Basophils Absolute: 0.1 10*3/uL (ref 0.0–0.1)
Basophils Relative: 1 %
Eosinophils Absolute: 0 10*3/uL (ref 0.0–0.5)
Eosinophils Relative: 0 %
HCT: 32.3 % — ABNORMAL LOW (ref 39.0–52.0)
Hemoglobin: 10.7 g/dL — ABNORMAL LOW (ref 13.0–17.0)
Immature Granulocytes: 1 %
Lymphocytes Relative: 10 %
Lymphs Abs: 0.9 10*3/uL (ref 0.7–4.0)
MCH: 30.1 pg (ref 26.0–34.0)
MCHC: 33.1 g/dL (ref 30.0–36.0)
MCV: 90.7 fL (ref 80.0–100.0)
Monocytes Absolute: 0.4 10*3/uL (ref 0.1–1.0)
Monocytes Relative: 5 %
Neutro Abs: 7 10*3/uL (ref 1.7–7.7)
Neutrophils Relative %: 83 %
Platelet Count: 421 10*3/uL — ABNORMAL HIGH (ref 150–400)
RBC: 3.56 MIL/uL — ABNORMAL LOW (ref 4.22–5.81)
RDW: 14 % (ref 11.5–15.5)
WBC Count: 8.4 10*3/uL (ref 4.0–10.5)
nRBC: 0 % (ref 0.0–0.2)

## 2021-05-01 LAB — CMP (CANCER CENTER ONLY)
ALT: 23 U/L (ref 0–44)
AST: 13 U/L — ABNORMAL LOW (ref 15–41)
Albumin: 3.2 g/dL — ABNORMAL LOW (ref 3.5–5.0)
Alkaline Phosphatase: 93 U/L (ref 38–126)
Anion gap: 11 (ref 5–15)
BUN: 20 mg/dL (ref 8–23)
CO2: 25 mmol/L (ref 22–32)
Calcium: 9.7 mg/dL (ref 8.9–10.3)
Chloride: 101 mmol/L (ref 98–111)
Creatinine: 0.65 mg/dL (ref 0.61–1.24)
GFR, Estimated: >60 mL/min (ref >60)
Glucose, Bld: 153 mg/dL — ABNORMAL HIGH (ref 70–99)
Potassium: 4.2 mmol/L (ref 3.5–5.1)
Sodium: 137 mmol/L (ref 135–145)
Total Bilirubin: 0.3 mg/dL (ref 0.3–1.2)
Total Protein: 8 g/dL (ref 6.5–8.1)

## 2021-05-01 MED ORDER — SODIUM CHLORIDE 0.9% FLUSH
10.0000 mL | Freq: Once | INTRAVENOUS | Status: AC
  Administered 2021-05-01: 10 mL

## 2021-05-01 MED ORDER — OXYCODONE HCL 5 MG PO TABS: 5.0000 mg | ORAL_TABLET | Freq: Two times a day (BID) | ORAL | 0 refills | Status: DC | PRN

## 2021-05-01 NOTE — Progress Notes (Signed)
[name] FOLLOW UP NOTE  Patient Care Team: [name], MD as PCP - General (Internal Medicine) Izora Gala, MD as Consulting Physician (Otolaryngology) [name], MD as Attending Physician (Radiation Oncology) Leota Sauers, RN (Inactive) as Oncology Nurse Navigator Karie Mainland, RD as Dietitian (Nutrition) Valentino Saxon [place], CCC-SLP as Speech Language Pathologist (Speech Pathology) Kennith Center, LCSW as Social Worker [name], MD as Consulting Physician (Hematology and Oncology) Malmfelt, Stephani Police, RN as Oncology Nurse Navigator  CHIEF COMPLAINTS/PURPOSE OF CONSULTATION:  Follow up after hospital discharge.  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 He is unfortunately not a candidate for any more radiation and surgery. He was recommended palliative chemotherapy or immunotherapy He is PDL1 neg, hence he may not benefit from [facility] We agreed to try single agent docetaxel at 75% of dose. He had 2 cycles of chemotherapy and was recently admitted for perforated duodenal ulcer.  He was recently discharged on some oral antibiotics but he is not quite sure if he has been using them as prescribed.  Today he appears frail, tired, continues to lose weight, lost about 8 to 9 pounds since last visit.  I have recommended that we repeat CT scan to assess response since he has completed 2 cycles of chemotherapy.  If he does seem to have some response, we will consider more chemotherapy.  We will defer chemotherapy for a week or 2 until he recovers from hospital discharge.  Orders Placed This Encounter  Procedures   CT Soft Tissue Neck W Contrast    Standing  Status:   Future    Standing Expiration Date:   05/01/2022    Order Specific Question:   If indicated for the ordered procedure, I authorize the administration of contrast media per Radiology protocol    Answer:   Yes    Order Specific Question:   Preferred imaging location?    Answer:   [facility]     HISTORY OF PRESENTING ILLNESS:   [name] 70 y.o. male is here for follow up after hospital discharge.  Oncology History Overview Note    [name] is a 70 y.o. male who has been treated for laryngeal cancer in the past.   Aug 2020, Pt had presented with dysphagia and difficulty breathing through his mouth, 20 lb weight loss. He saw Dr. Erik Obey who performed laryngoscopy and appreciated a 2 to 3 cm ovoid pedunculated mass arising from the right aryepiglottic fold and impinging on the supraglottic airway.  The vocal cords appeared to be mobile.  Urgent panendoscopy and biopsy were recommended to protect his airway.   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 According to his op note, a bulky necrotic and semi-pedunculated tumor coming off the lateral surface of the right aryepiglottic fold and the anterior and lateral aspect of the piriform sinus on the right side.  Airway was compromised by the tumor mass at intubation.   CT scan of the neck on 04/11/2019 showed glottic closure with no asymmetry of the cords, correlate with laryngoscopy results.  Questionable right level 2 lymph node measured 7 mm.  No definite pathologic lymph  nodes in the neck.   PET scan on 04/22/2019 revealed mild residual activity in the posterior right hypopharynx confined to the mucosa with no evidence of metastatic disease to the neck or distantly.  He underwent definitive radiotherapy to T2N0 laryngeal cancer  since he refused surgical treatment.  He then had PET imaging which showed a right neck lymph node,  suspicious on PET. Biopsy of right neck lymph node is positive for recurrence. He underwent right modified radical neck dissection including levels 2 and 3, including the internal jugular vein, suture marks the inferior jugular vein stump.  He most recently underwent a soft tissue neck CT scan on the date of 07/05/2020 that revealed asymmetric soft tissue thickening and enhancement along the right lateral pharynx. It also showed suspicious small 6 mm hyperenhancing nodular soft tissue along the posterior margin of the neck dissect at right level 3. Additionally, there was a mild inflammatory appearing right upper lobe centrilobular ground-glass opacity that was new (consider mild or developing upper lobe infection), post radiation changes to the lung apices, and sequelae of radiation and right neck dissection from previous neck CT.   Subsequently, the patient underwent a direct laryngoscopy and biopsy of oropharyngeal mass on the date of 08/13/2020. Pathology from the procedure revealed poorly differentiated squamous cell carcinoma.  PET showed recurrence of head and neck cancer with broad hypermetabolic pharyngeal mucosal lesion involving right lateral posterior oropharynx and hypopharynx extending from palatine tonsil to the vallecula.Hypermetabolic lymph node posterior sternocleidomastoid muscle on the RIGHT (level 3).Hypermetabolic nodule within the LEFT parotid glands favored small primary parotid neoplasm.  Recommendation was to consider proceeding with upfront concurrent chemo radiation followed by salvage surgery if needed. He completed 6 weekly cycles of chemotherapy and concurrent radiation, 10/2020.  PET scan 01/31/2021 with worsening nodal disease in the left neck and a right supraclavicular LN as discussed. Post treatment changes with soft tissue fullness in the area of the prior tumor but with markedly diminished metabolic activity.     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 SEE NOTE   04/11/2019 Imaging   CT neck: IMPRESSION: The glottis is closed with suboptimal evaluation of the vocal cords. No asymmetry of the cords. Correlate with laryngoscopy results.   No enlarged lymph nodes in the neck. 7 mm right level 2 lymph node may be reactive. No definite pathologic lymph nodes in the neck.   04/22/2019 Imaging   PET: IMPRESSION: 1. Mild residual activity in the posterior RIGHT hypopharynx confined to the mucosa. 2. No evidence of hypermetabolic metastatic lymph nodes in LEFT or RIGHT neck. 3. No evidence distant metastatic disease.   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  He has been feeling tired since his hospital discharge.  He is not a great historian, cannot remember if he is taking antibiotics that were prescribed to him during the hospitalization.  He has lost about 8 to 9 pounds since his last visit.  He has been on [employer] for nourishment, has been now using about 4 a day.  He denies any overt abdominal pain.  His throat pain continues to bother  him, he has been taking oxycodone twice a day.  No diarrhea or other change in bowel habits.  No change in urinary habits. Rest of the pertinent 10 point ROS reviewed and negative  MEDICAL HISTORY:  Past Medical History:  Diagnosis Date   Cancer ([place])    Throat cancer 2019   ETOH abuse    Frequent urination    Glaucoma    Hepatitis C virus infection without hepatic coma    dx'ed in 11/2018   History of radiation therapy 05/12/19- 07/06/19   Larynx   Hypertension    Wears denture    upper only; lost lower denture    SURGICAL HISTORY: Past Surgical History:  Procedure Laterality Date   ANKLE SURGERY  2011   right ankle   COLONOSCOPY  02/2019   polyps - Dr Havery Moros   DIRECT LARYNGOSCOPY N/A 04/01/2019   Procedure: DIRECT LARYNGOSCOPY WITH BIOPSY;  Surgeon: Jodi Marble, MD;  Location: [place];  Service: ENT;  Laterality: N/A;   DIRECT LARYNGOSCOPY N/A 01/06/2020   Procedure: DIRECT LARYNGOSCOPY;  Surgeon: Izora Gala, MD;  Location: [place];  Service: ENT;  Laterality: N/A;   DIRECT LARYNGOSCOPY N/A 08/13/2020   Procedure: DIRECT LARYNGOSCOPY;  Surgeon: Izora Gala, MD;  Location: [place];  Service: ENT;  Laterality: N/A;   ESOPHAGOSCOPY N/A 04/01/2019   Procedure: ESOPHAGOSCOPY;  Surgeon: Jodi Marble, MD;  Location: [place];  Service: ENT;  Laterality: N/A;   EXCISION ORAL TUMOR Right 08/13/2020   Procedure: BIOPSY OF OROPHARYNGEAL MASS;  Surgeon: Izora Gala, MD;  Location: [employer];  Service: ENT;  Laterality: Right;   EYE SURGERY Right    IR CM INJ ANY COLONIC TUBE W/FLUORO  12/20/2020   IR CM INJ ANY COLONIC TUBE W/FLUORO  03/20/2021   IR GASTROSTOMY TUBE MOD SED  09/24/2020   IR IMAGING GUIDED PORT INSERTION  09/24/2020   KNEE SURGERY     LAPAROTOMY N/A 04/12/2021   Procedure: EXPLORATORY LAPAROTOMY, REPAIR OF DUODENAL ULCER WITH Silvestre Gunner;  Surgeon: Felicie Morn, MD;  Location: [facility];  Service: General;  Laterality: N/A;    LARYNGOSCOPY AND BRONCHOSCOPY N/A 04/01/2019   Procedure: BRONCHOSCOPY;  Surgeon: Jodi Marble, MD;  Location: [place];  Service: ENT;  Laterality: N/A;   RADICAL NECK DISSECTION N/A 01/06/2020   Procedure: RADICAL NECK DISSECTION;  Surgeon: Izora Gala, MD;  [facility];  Service: ENT;  Laterality: N/A;    SOCIAL HISTORY: Social History   Socioeconomic History   Marital status: Single    Spouse name: Not on file   Number of children: 2   Years of education: Not on file   Highest education level: Not on file  Occupational History   Not on file  Tobacco Use   Smoking status: Former    Years: 50.00    Types: Cigarettes    Quit date: 06/01/2020  Years since quitting: 0.9   Smokeless tobacco: Never  Vaping Use   Vaping Use: Never used  Substance and Sexual Activity   Alcohol use: Yes    Alcohol/week: 4.0 standard drinks    Types: 4 Cans of beer per week    Comment: 40oz beer daily   Drug use: Yes    Types: Cocaine    Comment: none in 2 yrs   Sexual activity: Yes    Partners: Female  Other Topics Concern   Not on file  Social History Narrative   Patient is divorced with 2 children.   Patient is currently living with his sister.   Patient with a history of smoking a third of pack of cigarettes daily for 50 years.  Patient currently smoking 2 to 3 cigarettes/day.   Patient has never used smokeless tobacco.   Patient with occasional use of alcohol.   Patient last used cocaine approximately 6 months ago.  Patient denies use of marijuana.   Social Determinants of Health   Financial Resource Strain: Not on file  Food Insecurity: No Food Insecurity   Worried About Charity fundraiser in the Last Year: Never true   Ran Out of Food in the Last Year: Never true  Transportation Needs: No Transportation Needs   Lack of Transportation (Medical): No   Lack of Transportation (Non-Medical): No  Physical Activity: Not on file  Stress: Not on file  Social Connections: Unknown    Frequency of Communication with Friends and Family: Three times a week   Frequency of Social Gatherings with Friends and Family: More than three times a week   Attends Religious Services: Not on file   Active Member of Clubs or Organizations: Not on file   Attends Archivist Meetings: Not on file   Marital Status: Not on file  Intimate Partner Violence: Not on file    FAMILY HISTORY: Family History  Problem Relation Age of Onset   Breast cancer Sister    Colon cancer Brother 32       ????   Cancer Brother     ALLERGIES:  has No Known Allergies.  MEDICATIONS:  Current Outpatient Medications  Medication Sig Dispense Refill   amoxicillin (AMOXIL) 250 MG/5ML suspension Place 20 mLs (1,000 mg total) into feeding tube every 12 (twelve) hours for 10 days. 450 mL 0   atropine 1 % ophthalmic solution Place 1 drop into the right eye 2 (two) times a day.      clarithromycin (BIAXIN) 250 MG/5ML suspension Place 10 mLs (500 mg total) into feeding tube every 12 (twelve) hours for 10 days. 200 mL 0   dexamethasone (DECADRON) 4 MG tablet Take 2 tablets (8 mg total) by mouth See admin instructions. Start the day before Taxotere, take twice daily, then daily after chemo for 2 days.     guaiFENesin (ROBITUSSIN) 100 MG/5ML SOLN Place 10 mLs (200 mg total) into feeding tube 2 (two) times daily. 236 mL 0   lidocaine (XYLOCAINE) 2 % solution Use as directed 10 mLs in the mouth or throat every 6 (six) hours as needed for mouth pain. Mix 1 part of lidocaine with 1 part of water, swish and swallow 100 mL 2   lidocaine-prilocaine (EMLA) cream Apply 1 application topically daily as needed (access port).     Nutritional Supplements (KATE [employer]MS STANDARD 1.4) LIQD 487 mLs by Enteral route 3 (three) times daily. 1.5 cartons (487 ml) [employer] 1.4 TID with 60 ml free water  before and 60 ml free water after each TF bolus. Provide an additional 120 ml free water TID.     ondansetron (ZOFRAN) 8 MG tablet Take  1 tablet (8 mg total) by mouth 2 (two) times daily as needed for refractory nausea / vomiting. 30 tablet 1   oxyCODONE (OXY IR/ROXICODONE) 5 MG immediate release tablet Take 1 tablet (5 mg total) by mouth every 12 (twelve) hours as needed for severe pain. 60 tablet 0   pantoprazole sodium (PROTONIX) 40 mg/20 mL PACK Place 20 mLs (40 mg total) into feeding tube 2 (two) times daily. 3000 mL 1   prednisoLONE acetate (PRED FORTE) 1 % ophthalmic suspension Place 1 drop into the right eye 4 (four) times daily.     prochlorperazine (COMPAZINE) 10 MG tablet Take 1 tablet (10 mg total) by mouth every 6 (six) hours as needed (Nausea or vomiting). 30 tablet 1   Vitamin D, Ergocalciferol, (DRISDOL) 1.25 MG (50000 UNIT) CAPS capsule Take 50,000 Units by mouth every Saturday.     Water For Irrigation, Sterile (FREE WATER) SOLN Place 100 mLs into feeding tube every 4 (four) hours.     No current facility-administered medications for this visit.    PHYSICAL EXAMINATION:   ECOG PERFORMANCE STATUS: 0 - Asymptomatic  Vitals:   05/01/21 1255  BP: (!) 143/96  Pulse: 95  Resp: 18  Temp: 97.8 F (36.6 C)  SpO2: 99%   Filed Weights   05/01/21 1255  Weight: 103 lb 9 oz (47 kg)   Physical Exam Constitutional:      General: He is not in acute distress.    Comments: Frail   HENT:     Head: Normocephalic.     Mouth/Throat:     Mouth: Mucous membranes are moist.     Pharynx: No oropharyngeal exudate or posterior oropharyngeal erythema.  Neck:     Comments: No palpable lymphadenopathy on exam however his neck exam is very hard to discern, postop radiation changes leading to some tightness and scarring in the neck. Cardiovascular:     Rate and Rhythm: Normal rate and regular rhythm.  Pulmonary:     Effort: Pulmonary effort is normal.  Abdominal:     General: Abdomen is flat.     Palpations: Abdomen is soft.     Comments: G tube appears ok, no tenderness around the G-tube.  No purulent discharge noted.   Drain site also appears covered with some scab formation   Musculoskeletal:        General: No swelling or tenderness.     Cervical back: Normal range of motion and neck supple. No rigidity.  Lymphadenopathy:     Cervical: No cervical adenopathy (Possible palpable lymphadenopathy left neck, hard to appreciate on phy exam because of his previous radiation and associated skin changes).  Skin:    General: Skin is warm and dry.  Neurological:     General: No focal deficit present.     Mental Status: He is alert.  Psychiatric:        Mood and Affect: Mood normal.   No change in exam from last visit.  LABORATORY DATA:  I have reviewed the data as listed Lab Results  Component Value Date   WBC 8.4 05/01/2021   HGB 10.7 (L) 05/01/2021   HCT 32.3 (L) 05/01/2021   MCV 90.7 05/01/2021   PLT 421 (H) 05/01/2021     Chemistry      Component Value Date/Time   NA 137 05/01/2021 1244  K 4.2 05/01/2021 1244   CL 101 05/01/2021 1244   CO2 25 05/01/2021 1244   BUN 20 05/01/2021 1244   CREATININE 0.65 05/01/2021 1244   CREATININE 0.61 (L) 10/26/2019 1523      Component Value Date/Time   CALCIUM 9.7 05/01/2021 1244   ALKPHOS 93 05/01/2021 1244   AST 13 (L) 05/01/2021 1244   ALT 23 05/01/2021 1244   ALT 53 (H) 09/06/2019 1543   BILITOT 0.3 05/01/2021 1244       RADIOGRAPHIC STUDIES: I have personally reviewed the radiological images as listed and agreed with the findings in the report. DG Chest 1 View  Result Date: 04/12/2021 CLINICAL DATA:  History of throat cancer with abdominal pain. EXAM: CHEST  1 VIEW COMPARISON:  January 10, 2021 FINDINGS: There is stable left-sided venous Port-A-Cath positioning. The heart size and mediastinal contours are within normal limits. Both lungs are clear. Radiopaque contrast is seen within the gastric lumen. The visualized skeletal structures are unremarkable. IMPRESSION: No active disease. Electronically Signed   By: Virgina Norfolk M.D.   On:  04/12/2021 03:35   CT ABDOMEN PELVIS W CONTRAST  Result Date: 04/12/2021 CLINICAL DATA:  Nausea, vomiting, blood in gastrostomy catheter EXAM: CT ABDOMEN AND PELVIS WITH CONTRAST TECHNIQUE: Multidetector CT imaging of the abdomen and pelvis was performed using the standard protocol following bolus administration of intravenous contrast. CONTRAST:  42m OMNIPAQUE IOHEXOL 350 MG/ML SOLN COMPARISON:  PET-CT 01/31/2021, and previous FINDINGS: Lower chest: No pleural or pericardial effusion. 1.3 cm pleural-based nodule in the right middle lobe, new since previous. Hepatobiliary: No focal liver abnormality is seen. No gallstones, gallbladder wall thickening, or biliary dilatation. Pancreas: Unremarkable. No pancreatic ductal dilatation or surrounding inflammatory changes. Spleen: Normal in size without focal abnormality. Adrenals/Urinary Tract: Adrenal glands unremarkable. 1 cm probable cyst, mid left kidney. No hydronephrosis. Contrast material in the renal collecting systems and nondistended urinary bladder. Stomach/Bowel: The gastrostomy tube projects in expected location. There is dense contrast in the partially distended stomach. Small bowel is decompressed, with incomplete distal passage of oral contrast material. The colon is decompressed, unremarkable. Vascular/Lymphatic: Scattered calcified aortoiliac plaque without aneurysm or evident stenosis. Portal vein patent. No abdominal or pelvic adenopathy. Reproductive: Prostate is unremarkable. Other: There is a moderate amount of high attenuation fluid in the upper and anterior abdomen suggesting leak of oral contrast material, presumably from along the gastrostomy tract although there is no definite hyperdense leak adjacent to the gastrostomy catheter. Small volume fluid-attenuation pelvic ascites without evident loculation. No free air. Musculoskeletal: Mild L3 compression deformity, stable since 11/10/2019. Multilevel lumbar spondylitic change. No acute  fracture or worrisome bone lesion. IMPRESSION: 1. Contrast material in the peritoneal cavity of the upper abdomen suggesting leak along the gastrostomy tract. Critical Value/emergent results were called by telephone at the time of interpretation on 04/12/2021 at 5:38 am [facility], who verbally acknowledged these results. 2. Gastrostomy tube is appropriately positioned. No evidence of abscess. 3. 1.3 cm right middle lobe nodule, new since 01/31/2021. Favor infectious/inflammatory over primary/secondary neoplasm given the relatively short time frame of development. Electronically Signed   By: DLucrezia EuropeM.D.   On: 04/12/2021 05:41   DG ABDOMEN PEG TUBE LOCATION  Result Date: 04/12/2021 CLINICAL DATA:  Peg tube location. EXAM: ABDOMEN - 1 VIEW COMPARISON:  None. FINDINGS: A percutaneous gastrostomy tube is seen with its distal tip and insufflator bulb overlying left upper quadrant. Following injection of 30 mL of Omnipaque into the peg  tube, radiopaque contrast is seen throughout the gastric lumen. No contrast extravasation is identified. The bowel gas pattern is normal. No radio-opaque calculi are seen. IMPRESSION: Percutaneous gastrostomy tube positioned within the body of the stomach. Electronically Signed   By: Virgina Norfolk M.D.   On: 04/12/2021 02:38   DG UGI W SINGLE CM (SOL OR THIN BA)  Result Date: 04/17/2021 CLINICAL DATA:  History of Delford Field for perforated proximal duodenal ulcer. Postoperative day 5. EXAM: WATER SOLUBLE UPPER GI SERIES TECHNIQUE: Single-column upper GI series was performed using water soluble contrast. CONTRAST:  Approximately 60 cc water-soluble contrast administered. COMPARISON:  Abdomen pelvis CT 04/12/2021. FLUOROSCOPY TIME:  Fluoroscopy Time:  3 minutes and 7 seconds. Radiation Exposure Index (if provided by the fluoroscopic device): 180.2 mGy Number of Acquired Spot Images: FINDINGS: Patient was evaluated using prone in bilateral oblique  positioning with fluoro table in a 30-45 degree head up position. Water-soluble contrast material was a to boost via the existing G-tube. Duodenal bulb has a fixed distorted appearance, likely related to surgery. Gastric emptying is relatively prompt and the duodenum is not dilated. Transit of contrast in the duodenum through the third segment anterior to the spine was somewhat delay, likely due to positioning. Contrast material did migrate past the ligament of Treitz. Gastric emptying was observed for approximately 10 minutes in there is no evidence for contrast extravasation from the proximal duodenum to suggest leak. No evidence for contrast material entering the surgical drain. IMPRESSION: 1. No demonstrable leak from the proximal duodenum. The duodenal bulb is distorted and fixed in appearance, compatible with recent surgery. The second and third portions of the duodenum are nondilated and demonstrate normal peristaltic activity. 2. No evidence for contrast material entering the surgical drain. Electronically Signed   By: Misty Stanley M.D.   On: 04/17/2021 13:11   [place] EKG SITE RITE  Result Date: 04/12/2021 If Site Rite image not attached, placement could not be confirmed due to current cardiac rhythm.   PDL1 negative < 1%  All questions were answered. The patient knows to call the clinic with any problems, questions or concerns.  I spent 30 minutes in the care of this patient including H and P, review of records, counseling and coordination of care. We reviewed treatment plan, anticipated adverse effects, reviewed labs and follow up recommendations.    [name], MD 05/01/2021 3:29 PM

## 2021-05-01 NOTE — Assessment & Plan Note (Signed)
This is a very 38 pleasant male patient with laryngeal cancer s/p previous definitive RT in 2020 and most recently had concurrent CRT completed 10/2020 He had end of treatment PET scan which showed bilateral lymphadenopathy which is concerning for residual disease. He is unfortunately not a candidate for any more radiation and surgery. He was recommended palliative chemotherapy or immunotherapy He is PDL1 neg, hence he may not benefit from [facility] We agreed to try single agent docetaxel at 75% of dose. He had 2 cycles of chemotherapy and was recently admitted for perforated duodenal ulcer.  He was recently discharged on some oral antibiotics but he is not quite sure if he has been using them as prescribed.  Today he appears frail, tired, continues to lose weight, lost about 8 to 9 pounds since last visit.  I have recommended that we repeat CT scan to assess response since he has completed 2 cycles of chemotherapy.  If he does seem to have some response, we will consider more chemotherapy.  We will defer chemotherapy for a week or 2 until he recovers from hospital discharge.

## 2021-05-02 ENCOUNTER — Telehealth: Payer: Self-pay

## 2021-05-02 NOTE — Telephone Encounter (Signed)
Called and spoke with patient's caregiver, Verdene Lennert. Informed her of patient's CT scan scheduled for 8/25. Gave her number for central scheduling if any issues were to arrive. She verbalized understanding.

## 2021-05-03 ENCOUNTER — Ambulatory Visit: Payer: Medicare HMO

## 2021-05-03 ENCOUNTER — Telehealth: Payer: Self-pay | Admitting: Hematology and Oncology

## 2021-05-03 NOTE — Telephone Encounter (Signed)
Scheduled per 08/17 los, called and spoke with patient's relative. Patient should be aware of new rescheduled appointments.

## 2021-05-07 ENCOUNTER — Other Ambulatory Visit: Payer: Medicare HMO

## 2021-05-07 ENCOUNTER — Encounter: Payer: Medicare HMO | Admitting: Dietician

## 2021-05-07 ENCOUNTER — Ambulatory Visit: Payer: Medicare HMO | Admitting: Hematology and Oncology

## 2021-05-07 ENCOUNTER — Ambulatory Visit: Payer: Medicare HMO

## 2021-05-09 ENCOUNTER — Ambulatory Visit: Payer: Medicare HMO

## 2021-05-09 ENCOUNTER — Ambulatory Visit (HOSPITAL_COMMUNITY)
Admission: RE | Admit: 2021-05-09 | Discharge: 2021-05-09 | Disposition: A | Discharge status: Home / Self Care | Payer: Medicare HMO | Source: Ambulatory Visit | Attending: Hematology and Oncology | Admitting: Hematology and Oncology

## 2021-05-09 ENCOUNTER — Other Ambulatory Visit: Payer: Self-pay

## 2021-05-09 ENCOUNTER — Encounter (HOSPITAL_COMMUNITY): Payer: Self-pay

## 2021-05-09 DIAGNOSIS — C090 Malignant neoplasm of tonsillar fossa: Secondary | ICD-10-CM | POA: Diagnosis present

## 2021-05-09 DIAGNOSIS — C321 Malignant neoplasm of supraglottis: Secondary | ICD-10-CM | POA: Diagnosis present

## 2021-05-09 MED ORDER — HEPARIN SOD (PORK) LOCK FLUSH 100 UNIT/ML IV SOLN
INTRAVENOUS | Status: AC
  Filled 2021-05-09: qty 5

## 2021-05-09 MED ORDER — HEPARIN SOD (PORK) LOCK FLUSH 100 UNIT/ML IV SOLN
500.0000 [IU] | Freq: Once | INTRAVENOUS | Status: AC
  Administered 2021-05-09: 500 [IU] via INTRAVENOUS

## 2021-05-09 MED ORDER — IOHEXOL 350 MG/ML SOLN
60.0000 mL | Freq: Once | INTRAVENOUS | Status: AC | PRN
  Administered 2021-05-09: 60 mL via INTRAVENOUS

## 2021-05-14 ENCOUNTER — Ambulatory Visit: Payer: Medicare HMO

## 2021-05-14 ENCOUNTER — Telehealth: Payer: Self-pay

## 2021-05-14 ENCOUNTER — Other Ambulatory Visit: Payer: Medicare HMO

## 2021-05-14 ENCOUNTER — Encounter: Payer: Medicare HMO | Admitting: Dietician

## 2021-05-14 ENCOUNTER — Ambulatory Visit: Payer: Medicare HMO | Admitting: Hematology and Oncology

## 2021-05-14 NOTE — Telephone Encounter (Signed)
Attempted to contact patient's sister [name] to reschedule follow up appointment. No answer left a message to return call.

## 2021-05-15 NOTE — Progress Notes (Signed)
[name]  Patient Care Team: [name], MD as PCP - General (Internal Medicine) Izora Gala, MD as Consulting Physician (Otolaryngology) [name], MD as Attending Physician (Radiation Oncology) Leota Sauers, RN (Inactive) as Oncology Nurse Navigator Karie Mainland, RD as Dietitian (Nutrition) Valentino Saxon [place], CCC-SLP as Speech Language Pathologist (Speech Pathology) Kennith Center, LCSW as Social Worker [name], MD as Consulting Physician (Hematology and Oncology) Malmfelt, Stephani Police, RN as Oncology Nurse Navigator  CHIEF COMPLAINTS/PURPOSE OF CONSULTATION:  Follow up after hospital discharge.  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 He is unfortunately not a candidate for any more radiation and surgery. He was recommended palliative chemotherapy or immunotherapy He is PDL1 neg, hence he may not benefit from [place] alone. His performance status was also borderline and he was very frail when he presented hence we have agreed to try single agent chemotherapy. We agreed to try single agent docetaxel at 75% of dose. He had 2 cycles of chemotherapy and was recently admitted for perforated duodenal ulcer.  He had recovered well from the perforated duodenal ulcer however his recent imaging is consistent with progression in both the primary lesion as well as the lymphadenopathy. We have discussed about considering cis-platinum with immunotherapy, I wonder if he can tolerate a doublet chemo with immunotherapy.   Again discussed that the systemic therapy can only control the disease for several months but will not cure the cancer. Family  expressed that they would also like to see someone for second opinion to see if there is any available clinical trial.  I think this is perfectly reasonable.  We will stop docetaxel at this time and a referral will be sent to [facility] for further recommendations. He also has ENT appointment at [facility] coming up. He understands that if the systemic options are limited we will have to consider palliative care. Thank you for consulting [place] the care of this patient.  Please not hesitate to contact us with any additional questions or concerns.  No orders of the defined types were placed in this encounter.    HISTORY OF PRESENTING ILLNESS:   [name] 70 y.o. male is here for follow up after hospital discharge.  Oncology History Overview Note    [name] is a 70 y.o. male who has been treated for laryngeal cancer in the past.   Aug 2020, Pt had presented with dysphagia and difficulty breathing through his mouth, 20 lb weight loss. He saw Dr. Erik Obey who performed laryngoscopy and appreciated a 2 to 3 cm ovoid pedunculated mass arising from the right aryepiglottic fold and impinging on the supraglottic airway.  The vocal cords appeared to be mobile.  Urgent panendoscopy and biopsy were recommended to protect his airway.   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 According to his op note, a bulky necrotic and semi-pedunculated tumor coming off the lateral surface of the right aryepiglottic fold and the anterior and lateral aspect of the piriform sinus on the right side.  Airway was compromised by the tumor mass at intubation.   CT scan of the neck on 04/11/2019 showed  glottic closure with no asymmetry of the cords, correlate with laryngoscopy results.  Questionable right level 2 lymph node measured 7 mm.  No definite pathologic lymph nodes in the neck.   PET scan on 04/22/2019 revealed mild residual  activity in the posterior right hypopharynx confined to the mucosa with no evidence of metastatic disease to the neck or distantly.  He underwent definitive radiotherapy to T2N0 laryngeal cancer  since he refused surgical treatment.  He then had PET imaging which showed a right neck lymph node, suspicious on PET. Biopsy of right neck lymph node is positive for recurrence. He underwent right modified radical neck dissection including levels 2 and 3, including the internal jugular vein, suture marks the inferior jugular vein stump.  He most recently underwent a soft tissue neck CT scan on the date of 07/05/2020 that revealed asymmetric soft tissue thickening and enhancement along the right lateral pharynx. It also showed suspicious small 6 mm hyperenhancing nodular soft tissue along the posterior margin of the neck dissect at right level 3. Additionally, there was a mild inflammatory appearing right upper lobe centrilobular ground-glass opacity that was new (consider mild or developing upper lobe infection), post radiation changes to the lung apices, and sequelae of radiation and right neck dissection from previous neck CT.   Subsequently, the patient underwent a direct laryngoscopy and biopsy of oropharyngeal mass on the date of 08/13/2020. Pathology from the procedure revealed poorly differentiated squamous cell carcinoma.  PET showed recurrence of head and neck cancer with broad hypermetabolic pharyngeal mucosal lesion involving right lateral posterior oropharynx and hypopharynx extending from palatine tonsil to the vallecula.Hypermetabolic lymph node posterior sternocleidomastoid muscle on the RIGHT (level 3).Hypermetabolic nodule within the LEFT parotid glands favored small primary parotid neoplasm.  Recommendation was to consider proceeding with upfront concurrent chemo radiation followed by salvage surgery if needed. He completed 6 weekly cycles of chemotherapy and concurrent radiation,  10/2020.  PET scan 01/31/2021 with worsening nodal disease in the left neck and a right supraclavicular LN as discussed. Post treatment changes with soft tissue fullness in the area of the prior tumor but with markedly diminished metabolic activity.   He was started on single agent docetaxel given progressive tumor.  He was not thought to be a good candidate for any further radiation or surgery. He was admitted to the hospital from 04/12/2021 to 04/22/2021 with perforated duodenal ulcer.  His cycle 3 chemotherapy was delayed for the above-mentioned reason.  CT imaging from August 25 showed progression of lesion in the right tonsil with central fluid collection and peripheral enhancement.  This was a site of prior recurrence, could represent a progressive tumor or abscess.  There is also progressive malignant lymphadenopathy noted in the left neck.  Stable lymphadenopathy in the right supraclavicular region.  There is new area of stranding in the superior segment of the left lower lobe which is of uncertain etiology.   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 SEE NOTE   04/11/2019 Imaging   CT neck: IMPRESSION: The glottis is closed with suboptimal evaluation of the vocal cords. No asymmetry of the cords. Correlate with laryngoscopy results.  No enlarged lymph nodes in the neck. 7 mm right level 2 lymph node may be reactive. No definite pathologic lymph nodes in the neck.   04/22/2019 Imaging   PET: IMPRESSION: 1. Mild residual activity in the posterior RIGHT hypopharynx confined to the mucosa. 2. No  evidence of hypermetabolic metastatic lymph nodes in LEFT or RIGHT neck. 3. No evidence distant metastatic disease.   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 He is feeling okay, reports some ongoing pain in his throat.  He he uses his G-tube for feedings, has been using 5 feedings of Dillard [place] a day.  He has gained about 2 to 3 pounds since his last visit.  He denies any fevers, chills, abdominal pain or change in bowel habits. No change in breathing except when he lays down. Rest of the pertinent 10 point ROS reviewed and negative.  MEDICAL HISTORY:  Past Medical History:  Diagnosis Date   Cancer ([place])    Throat cancer 2019   ETOH abuse    Frequent urination    Glaucoma    Hepatitis C virus infection without hepatic coma    dx'ed in 11/2018   History of radiation therapy 05/12/19- 07/06/19   Larynx   Hypertension    Wears denture    upper only; lost lower denture    SURGICAL HISTORY: Past Surgical History:  Procedure Laterality Date   ANKLE SURGERY  2011   right ankle   COLONOSCOPY  02/2019   polyps - Dr Havery Moros   DIRECT LARYNGOSCOPY N/A 04/01/2019   Procedure: DIRECT LARYNGOSCOPY WITH BIOPSY;  Surgeon: Jodi Marble, MD;  Location: [facility];  Service: ENT;  Laterality: N/A;   DIRECT LARYNGOSCOPY N/A 01/06/2020   Procedure: DIRECT LARYNGOSCOPY;  Surgeon: Izora Gala, MD;  Location: [facility];  Service: ENT;  Laterality: N/A;   DIRECT  LARYNGOSCOPY N/A 08/13/2020   Procedure: DIRECT LARYNGOSCOPY;  Surgeon: Izora Gala, MD;  Location: [facility];  Service: ENT;  Laterality: N/A;   ESOPHAGOSCOPY N/A 04/01/2019   Procedure: ESOPHAGOSCOPY;  Surgeon: Jodi Marble, MD;  Location: [facility];  Service: ENT;  Laterality: N/A;   EXCISION ORAL TUMOR Right 08/13/2020   Procedure: BIOPSY OF OROPHARYNGEAL MASS;  Surgeon: Izora Gala, MD;  Location: [facility];  Service: ENT;  Laterality: Right;   EYE SURGERY Right    IR CM INJ ANY COLONIC TUBE W/FLUORO  12/20/2020   IR CM INJ ANY COLONIC TUBE W/FLUORO  03/20/2021   IR GASTROSTOMY TUBE MOD SED  09/24/2020   IR IMAGING GUIDED PORT INSERTION  09/24/2020   KNEE SURGERY     LAPAROTOMY N/A 04/12/2021   Procedure: EXPLORATORY LAPAROTOMY, REPAIR OF DUODENAL ULCER WITH Silvestre Gunner;  Surgeon: Felicie Morn, MD;  Location: [facility];  Service: General;  Laterality: N/A;   LARYNGOSCOPY AND BRONCHOSCOPY N/A 04/01/2019   Procedure:  BRONCHOSCOPY;  Surgeon: Jodi Marble, MD;  Location: [facility];  Service: ENT;  Laterality: N/A;   RADICAL NECK DISSECTION N/A 01/06/2020   Procedure: RADICAL NECK DISSECTION;  Surgeon: Izora Gala, MD;  Location: [facility];  Service: ENT;  Laterality: N/A;    SOCIAL HISTORY: Social History   Socioeconomic History   Marital status: Single    Spouse name: Not on file   Number of children: 2   Years of education: Not on file   Highest education level: Not on file  Occupational History   Not on file  Tobacco Use   Smoking status: Former    Years: 50.00    Types: Cigarettes    Quit date: 06/01/2020    Years since quitting: 0.9   Smokeless tobacco: Never  Vaping Use   Vaping Use: Never used  Substance and Sexual Activity   Alcohol use: Yes    Alcohol/week: 4.0 standard drinks    Types: 4 Cans of beer per week    Comment: 40oz beer daily   Drug use: Yes    Types: Cocaine    Comment: none in 2 yrs   Sexual activity: Yes    Partners:  Female  Other Topics Concern   Not on file  Social History Narrative   Patient is divorced with 2 children.   Patient is currently living with his sister.   Patient with a history of smoking a third of pack of cigarettes daily for 50 years.  Patient currently smoking 2 to 3 cigarettes/day.   Patient has never used smokeless tobacco.   Patient with occasional use of alcohol.   Patient last used cocaine approximately 6 months ago.  Patient denies use of marijuana.   Social Determinants of Health   Financial Resource Strain: Not on file  Food Insecurity: No Food Insecurity   Worried About Charity fundraiser in the Last Year: Never true   Ran Out of Food in the Last Year: Never true  Transportation Needs: No Transportation Needs   Lack of Transportation (Medical): No   Lack of Transportation (Non-Medical): No  Physical Activity: Not on file  Stress: Not on file  Social Connections: Unknown   Frequency of Communication with Friends and Family: Three times a week   Frequency of Social Gatherings with Friends and Family: More than three times a week   Attends Religious Services: Not on file   Active Member of Clubs or Organizations: Not on file   Attends Archivist Meetings: Not on file   Marital Status: Not on file  Intimate Partner Violence: Not on file    FAMILY HISTORY: Family History  Problem Relation Age of Onset   Breast cancer Sister    Colon cancer Brother 10       ????   Cancer Brother     ALLERGIES:  has No Known Allergies.  MEDICATIONS:  Current Outpatient Medications  Medication Sig Dispense Refill   atropine 1 % ophthalmic solution Place 1 drop into the right eye 2 (two) times a day.      dexamethasone (DECADRON) 4 MG tablet Take 2 tablets (8 mg total) by mouth See admin instructions. Start the day before Taxotere, take twice daily, then daily after chemo for 2 days.     guaiFENesin (ROBITUSSIN) 100 MG/5ML SOLN Place 10 mLs (200 mg total) into feeding  tube 2 (two) times daily. 236 mL 0   lidocaine (XYLOCAINE) 2 % solution Use as directed 10 mLs in the mouth  or throat every 6 (six) hours as needed for mouth pain. Mix 1 part of lidocaine with 1 part of water, swish and swallow 100 mL 2   lidocaine-prilocaine (EMLA) cream Apply 1 application topically daily as needed (access port).     Nutritional Supplements (KATE FARMS STANDARD 1.4) LIQD 487 mLs by Enteral route 3 (three) times daily. 1.5 cartons (487 ml) [employer] 1.4 TID with 60 ml free water before and 60 ml free water after each TF bolus. Provide an additional 120 ml free water TID.     ondansetron (ZOFRAN) 8 MG tablet Take 1 tablet (8 mg total) by mouth 2 (two) times daily as needed for refractory nausea / vomiting. 30 tablet 1   oxyCODONE (OXY IR/ROXICODONE) 5 MG immediate release tablet Take 1 tablet (5 mg total) by mouth every 12 (twelve) hours as needed for severe pain. 60 tablet 0   pantoprazole sodium (PROTONIX) 40 mg/20 mL PACK Place 20 mLs (40 mg total) into feeding tube 2 (two) times daily. 3000 mL 1   prednisoLONE acetate (PRED FORTE) 1 % ophthalmic suspension Place 1 drop into the right eye 4 (four) times daily.     prochlorperazine (COMPAZINE) 10 MG tablet Take 1 tablet (10 mg total) by mouth every 6 (six) hours as needed (Nausea or vomiting). 30 tablet 1   Vitamin D, Ergocalciferol, (DRISDOL) 1.25 MG (50000 UNIT) CAPS capsule Take 50,000 Units by mouth every Saturday.     Water For Irrigation, Sterile (FREE WATER) SOLN Place 100 mLs into feeding tube every 4 (four) hours.     No current facility-administered medications for this visit.    PHYSICAL EXAMINATION:   ECOG PERFORMANCE STATUS: 0 - Asymptomatic  Vitals:   05/16/21 1207  BP: 139/77  Pulse: 68  Resp: 18  Temp: 97.8 F (36.6 C)  SpO2: 100%    Filed Weights   05/16/21 1207  Weight: 108 lb 2 oz (49 kg)    Physical Exam Constitutional:      General: He is not in acute distress.    Comments: Frail   HENT:      Head: Normocephalic.     Mouth/Throat:     Mouth: Mucous membranes are moist.     Pharynx: No oropharyngeal exudate or posterior oropharyngeal erythema.  Neck:     Comments: No palpable lymphadenopathy on exam but as mentioned before, neck exam is challenging on him given postop and radiation-induced fibrosis and scarring. Cardiovascular:     Rate and Rhythm: Normal rate and regular rhythm.  Pulmonary:     Effort: Pulmonary effort is normal.  Abdominal:     General: Abdomen is flat.     Palpations: Abdomen is soft.     Comments: G tube appears ok, no tenderness around the G-tube.  Some drainage doesn't appear purulent    Musculoskeletal:        General: No swelling or tenderness.     Cervical back: Normal range of motion and neck supple. No rigidity.  Skin:    General: Skin is warm and dry.  Neurological:     General: No focal deficit present.     Mental Status: He is alert.  Psychiatric:        Mood and Affect: Mood normal.   No change in exam from last visit.  LABORATORY DATA:  I have reviewed the data as listed Lab Results  Component Value Date   WBC 7.9 05/16/2021   HGB 9.9 (L) 05/16/2021   HCT 29.3 (  L) 05/16/2021   MCV 89.3 05/16/2021   PLT 199 05/16/2021     Chemistry      Component Value Date/Time   NA 139 05/16/2021 1155   K 4.1 05/16/2021 1155   CL 103 05/16/2021 1155   CO2 25 05/16/2021 1155   BUN 18 05/16/2021 1155   CREATININE 0.60 (L) 05/16/2021 1155   CREATININE 0.61 (L) 10/26/2019 1523      Component Value Date/Time   CALCIUM 9.6 05/16/2021 1155   ALKPHOS 54 05/16/2021 1155   AST 9 (L) 05/16/2021 1155   ALT 6 05/16/2021 1155   ALT 53 (H) 09/06/2019 1543   BILITOT 0.4 05/16/2021 1155       RADIOGRAPHIC STUDIES: I have personally reviewed the radiological images as listed and agreed with the findings in the report. CT Soft Tissue Neck W Contrast  Result Date: 05/10/2021 CLINICAL DATA:  Head neck cancer with recurrence EXAM: CT NECK  WITH CONTRAST TECHNIQUE: Multidetector CT imaging of the neck was performed using the standard protocol following the bolus administration of intravenous contrast. CONTRAST:  11m OMNIPAQUE IOHEXOL 350 MG/ML SOLN COMPARISON:  CT neck 03/11/2021.  PET-CT 01/31/2021 FINDINGS: Pharynx and larynx: Enlarging lesion in the region of the right tonsil. This measures approximately 12 mm in diameter with central fluid density and surrounding enhancement. This was the site of a recurrent lesion on PET of 08/30/2020 however had improved on the PET of 01/31/2021. This could represent infection or recurrent tumor. Close monitoring recommended. Post radiation changes seen throughout the pharynx and larynx with mucosal edema. Airway appears adequately patent. Salivary glands: Post radiation atrophy in the submandibular gland bilaterally. Negative parotid bilaterally. Thyroid: Negative Lymph nodes: Right supraclavicular lymph node measures 15 x 17 mm and was hypermetabolic on the prior PET. No change in size. Lymph node mass in the left neck with multiple enlarged lymph nodes, measuring 24 x 17 mm, 12 mm, and 8 mm in diameter. These lymph nodes appear more prominent compared with the prior neck CT 03/11/2021 Vascular: Right jugular vein occluded, chronic and unchanged. Left jugular vein patent. Mild atherosclerotic disease in the carotid artery bilaterally. Limited intracranial: Negative Visualized orbits: Incompletely imaged Mastoids and visualized paranasal sinuses: Negative Skeleton: Moderate to advanced cervical spondylosis. No acute skeletal lesion Upper chest: Mild reticular stranding in the left upper lobe anteriorly. New area of stranding in the superior segment of the left lower lobe not present on Jan 31, 2021 PET-CT. Possible inflammation or less likely tumor. Follow-up recommended. Other: None IMPRESSION: 1. Progression of lesion in the right tonsil with central fluid collection and peripheral enhancement. This was a  site of prior recurrent sick could represent progressive tumor or abscess. Continued follow-up recommended. 2. Progressive malignant lymphadenopathy in the left neck. Stable lymphadenopathy right supraclavicular region. 3. New area of stranding in the superior segment left lower lobe but certain etiology. Attention on follow-up recommended. Electronically Signed   By: CFranchot GalloM.D.   On: 05/10/2021 10:40   DG UGI W SINGLE CM (SOL OR THIN BA)  Result Date: 04/17/2021 CLINICAL DATA:  History of GDelford Fieldfor perforated proximal duodenal ulcer. Postoperative day 5. EXAM: WATER SOLUBLE UPPER GI SERIES TECHNIQUE: Single-column upper GI series was performed using water soluble contrast. CONTRAST:  Approximately 60 cc water-soluble contrast administered. COMPARISON:  Abdomen pelvis CT 04/12/2021. FLUOROSCOPY TIME:  Fluoroscopy Time:  3 minutes and 7 seconds. Radiation Exposure Index (if provided by the fluoroscopic device): 180.2 mGy Number of Acquired Spot Images:  FINDINGS: Patient was evaluated using prone in bilateral oblique positioning with fluoro table in a 30-45 degree head up position. Water-soluble contrast material was a to boost via the existing G-tube. Duodenal bulb has a fixed distorted appearance, likely related to surgery. Gastric emptying is relatively prompt and the duodenum is not dilated. Transit of contrast in the duodenum through the third segment anterior to the spine was somewhat delay, likely due to positioning. Contrast material did migrate past the ligament of Treitz. Gastric emptying was observed for approximately 10 minutes in there is no evidence for contrast extravasation from the proximal duodenum to suggest leak. No evidence for contrast material entering the surgical drain. IMPRESSION: 1. No demonstrable leak from the proximal duodenum. The duodenal bulb is distorted and fixed in appearance, compatible with recent surgery. The second and third portions of the duodenum are  nondilated and demonstrate normal peristaltic activity. 2. No evidence for contrast material entering the surgical drain. Electronically Signed   By: Misty Stanley M.D.   On: 04/17/2021 13:11    PDL1 negative < 1%  All questions were answered. The patient knows to call the clinic with any problems, questions or concerns.  I spent 30 minutes in the care of this patient including H and P, review of records, counseling and coordination of care. I reviewed imaging with radiology and overall they are concerned about progression.    [name], MD 05/16/2021 1:02 PM

## 2021-05-16 ENCOUNTER — Encounter: Payer: Self-pay | Admitting: Hematology and Oncology

## 2021-05-16 ENCOUNTER — Inpatient Hospital Stay (HOSPITAL_BASED_OUTPATIENT_CLINIC_OR_DEPARTMENT_OTHER): Payer: Medicare HMO | Admitting: Hematology and Oncology

## 2021-05-16 ENCOUNTER — Other Ambulatory Visit: Payer: Self-pay

## 2021-05-16 ENCOUNTER — Inpatient Hospital Stay: Payer: Medicare HMO

## 2021-05-16 ENCOUNTER — Inpatient Hospital Stay: Payer: Medicare HMO | Attending: Hematology and Oncology

## 2021-05-16 ENCOUNTER — Ambulatory Visit: Payer: Medicare HMO

## 2021-05-16 DIAGNOSIS — C321 Malignant neoplasm of supraglottis: Secondary | ICD-10-CM

## 2021-05-16 DIAGNOSIS — G893 Neoplasm related pain (acute) (chronic): Secondary | ICD-10-CM | POA: Diagnosis not present

## 2021-05-16 DIAGNOSIS — C090 Malignant neoplasm of tonsillar fossa: Secondary | ICD-10-CM

## 2021-05-16 DIAGNOSIS — Z5111 Encounter for antineoplastic chemotherapy: Secondary | ICD-10-CM | POA: Diagnosis not present

## 2021-05-16 DIAGNOSIS — Z79899 Other long term (current) drug therapy: Secondary | ICD-10-CM | POA: Insufficient documentation

## 2021-05-16 DIAGNOSIS — F17210 Nicotine dependence, cigarettes, uncomplicated: Secondary | ICD-10-CM | POA: Insufficient documentation

## 2021-05-16 DIAGNOSIS — C329 Malignant neoplasm of larynx, unspecified: Secondary | ICD-10-CM | POA: Diagnosis present

## 2021-05-16 DIAGNOSIS — Z95828 Presence of other vascular implants and grafts: Secondary | ICD-10-CM

## 2021-05-16 DIAGNOSIS — Z923 Personal history of irradiation: Secondary | ICD-10-CM | POA: Diagnosis not present

## 2021-05-16 LAB — CMP (CANCER CENTER ONLY)
ALT: 6 U/L (ref 0–44)
AST: 9 U/L — ABNORMAL LOW (ref 15–41)
Albumin: 3.4 g/dL — ABNORMAL LOW (ref 3.5–5.0)
Alkaline Phosphatase: 54 U/L (ref 38–126)
Anion gap: 11 (ref 5–15)
BUN: 18 mg/dL (ref 8–23)
CO2: 25 mmol/L (ref 22–32)
Calcium: 9.6 mg/dL (ref 8.9–10.3)
Chloride: 103 mmol/L (ref 98–111)
Creatinine: 0.6 mg/dL — ABNORMAL LOW (ref 0.61–1.24)
GFR, Estimated: >60 mL/min (ref >60)
Glucose, Bld: 106 mg/dL — ABNORMAL HIGH (ref 70–99)
Potassium: 4.1 mmol/L (ref 3.5–5.1)
Sodium: 139 mmol/L (ref 135–145)
Total Bilirubin: 0.4 mg/dL (ref 0.3–1.2)
Total Protein: 7.3 g/dL (ref 6.5–8.1)

## 2021-05-16 LAB — CBC WITH DIFFERENTIAL (CANCER CENTER ONLY)
Abs Immature Granulocytes: 0.11 10*3/uL — ABNORMAL HIGH (ref 0.00–0.07)
Basophils Absolute: 0 10*3/uL (ref 0.0–0.1)
Basophils Relative: 1 %
Eosinophils Absolute: 0 10*3/uL (ref 0.0–0.5)
Eosinophils Relative: 0 %
HCT: 29.3 % — ABNORMAL LOW (ref 39.0–52.0)
Hemoglobin: 9.9 g/dL — ABNORMAL LOW (ref 13.0–17.0)
Immature Granulocytes: 1 %
Lymphocytes Relative: 14 %
Lymphs Abs: 1.1 10*3/uL (ref 0.7–4.0)
MCH: 30.2 pg (ref 26.0–34.0)
MCHC: 33.8 g/dL (ref 30.0–36.0)
MCV: 89.3 fL (ref 80.0–100.0)
Monocytes Absolute: 0.5 10*3/uL (ref 0.1–1.0)
Monocytes Relative: 6 %
Neutro Abs: 6.1 10*3/uL (ref 1.7–7.7)
Neutrophils Relative %: 78 %
Platelet Count: 199 10*3/uL (ref 150–400)
RBC: 3.28 MIL/uL — ABNORMAL LOW (ref 4.22–5.81)
RDW: 14.5 % (ref 11.5–15.5)
WBC Count: 7.9 10*3/uL (ref 4.0–10.5)
nRBC: 0 % (ref 0.0–0.2)

## 2021-05-16 MED ORDER — SODIUM CHLORIDE 0.9% FLUSH
10.0000 mL | Freq: Once | INTRAVENOUS | Status: AC
  Administered 2021-05-16: 10 mL

## 2021-05-16 MED ORDER — OXYCODONE HCL 5 MG PO TABS: 5.0000 mg | ORAL_TABLET | Freq: Two times a day (BID) | ORAL | 0 refills | Status: DC | PRN

## 2021-05-16 NOTE — Assessment & Plan Note (Signed)
This is a very 34 pleasant male patient with laryngeal cancer s/p previous definitive RT in 2020 and most recently had concurrent CRT completed 10/2020 He had end of treatment PET scan which showed bilateral lymphadenopathy which is concerning for residual disease. He is unfortunately not a candidate for any more radiation and surgery. He was recommended palliative chemotherapy or immunotherapy He is PDL1 neg, hence he may not benefit from [place] alone. His performance status was also borderline and he was very frail when he presented hence we have agreed to try single agent chemotherapy. We agreed to try single agent docetaxel at 75% of dose. He had 2 cycles of chemotherapy and was recently admitted for perforated duodenal ulcer.  He had recovered well from the perforated duodenal ulcer however his recent imaging is consistent with progression in both the primary lesion as well as the lymphadenopathy. We have discussed about considering cis-platinum with immunotherapy, I wonder if he can tolerate a doublet chemo with immunotherapy.   Again discussed that the systemic therapy can only control the disease for several months but will not cure the cancer. Family expressed that they would also like to see someone for second opinion to see if there is any available clinical trial.  I think this is perfectly reasonable.  We will stop docetaxel at this time and a referral will be sent to [facility] for further recommendations. He also has ENT appointment at [facility] coming up. He understands that if the systemic options are limited we will have to consider palliative care. Thank you for consulting [place] the care of this patient.  Please not hesitate to contact us with any additional questions or concerns.

## 2021-05-16 NOTE — Addendum Note (Signed)
Addended by: Adaline Sill on: 05/16/2021 03:21 PM   Modules accepted: Orders

## 2021-05-17 ENCOUNTER — Telehealth: Payer: Self-pay

## 2021-05-17 NOTE — Telephone Encounter (Signed)
Reached out to [facility] regarding referral for second opinion. Patient already seen by [facility]. New imaging/office notes sent.

## 2021-05-18 ENCOUNTER — Inpatient Hospital Stay: Payer: Medicare HMO

## 2021-05-22 ENCOUNTER — Encounter: Payer: Medicare HMO | Admitting: Dietician

## 2021-05-22 ENCOUNTER — Ambulatory Visit: Payer: Medicare HMO | Admitting: Hematology and Oncology

## 2021-05-22 ENCOUNTER — Ambulatory Visit: Payer: Medicare HMO

## 2021-05-22 ENCOUNTER — Other Ambulatory Visit: Payer: Medicare HMO

## 2021-05-23 ENCOUNTER — Telehealth: Payer: Self-pay

## 2021-05-23 NOTE — Telephone Encounter (Signed)
2nd opinion referral faxed to [place] number provider by [facility].

## 2021-05-24 ENCOUNTER — Ambulatory Visit: Payer: Medicare HMO

## 2021-05-28 ENCOUNTER — Ambulatory Visit: Payer: Medicare HMO | Admitting: Hematology and Oncology

## 2021-05-28 ENCOUNTER — Ambulatory Visit: Payer: Medicare HMO

## 2021-05-28 ENCOUNTER — Other Ambulatory Visit: Payer: Medicare HMO

## 2021-05-29 ENCOUNTER — Inpatient Hospital Stay: Payer: Medicare HMO | Admitting: Dietician

## 2021-05-29 ENCOUNTER — Inpatient Hospital Stay: Payer: Medicare HMO

## 2021-05-29 ENCOUNTER — Inpatient Hospital Stay: Payer: Medicare HMO | Admitting: Hematology and Oncology

## 2021-05-29 NOTE — Progress Notes (Signed)
Nutrition Follow-up:  Patient with laryngeal cancer. He completed concurrent chemoradiation therapy 2/22. Patient currently not receiving treatment.   Recent hospitalization 7/29-8/8 for perforated duodenal ulcer s/p ex lap Phillip Heal patch and G-tube exchange on 7/29.   Patient seen by MD on 9/1 and due to progression, chemotherapy discontinued. Spoke with patient via telephone. He reports considering immunotherapy, states he has appointment at [facility] on 10/5 for second opinion. He reports tolerating 5 cartons [employer] 1.4 via tube split over three feedings. Patient is giving 2 cartons at one feeding followed by 1.5 cartons for other 2 feedings. Patient reports sometimes he is able to give 2 cartons TID. Patient denies nausea, vomiting, diarrhea, constipation.   Medications: Oxycodone, Drisdol  Labs: 9/1 - Glucose 106, Cr .60  Anthropometrics: Weight 108 lb 2 oz on 9/1 increased from 104 lb 11.5 oz on 8/8   Estimated Energy Needs (30-35 kcal/kg/IBW (70kg) to support weight gain)  Kcals: 2100-2450 Protein: 105-120 Fluid: 2.2 L  NUTRITION DIAGNOSIS: Unintentional weight loss stable   MALNUTRITION DIAGNOSIS: Severe malnutrition likely ongoing   INTERVENTION:  Continue 5 cartons [employer] 1.4 via tube split over three feedings daily. Flush with 60 ml water before and after each feeding. Give additional 3 cups water via tube daily. This provides 2275 kcal, 100 grams protein, 2250 ml total water  MONITORING, EVALUATION, GOAL: Patient will tolerate increased calories and protein from tube feed regimen to promote weight gain   NEXT VISIT: Wednesday October 12 in clinic for weight check

## 2021-05-30 ENCOUNTER — Ambulatory Visit: Payer: Medicare HMO

## 2021-05-31 ENCOUNTER — Inpatient Hospital Stay: Payer: Medicare HMO

## 2021-06-05 ENCOUNTER — Ambulatory Visit: Payer: Medicare HMO | Admitting: Physician Assistant

## 2021-06-06 ENCOUNTER — Ambulatory Visit: Payer: Medicare HMO

## 2021-06-06 ENCOUNTER — Other Ambulatory Visit: Payer: Medicare HMO

## 2021-06-06 ENCOUNTER — Other Ambulatory Visit: Payer: Self-pay | Admitting: Hematology and Oncology

## 2021-06-06 ENCOUNTER — Ambulatory Visit: Payer: Medicare HMO | Admitting: Hematology and Oncology

## 2021-06-06 DIAGNOSIS — C329 Malignant neoplasm of larynx, unspecified: Secondary | ICD-10-CM

## 2021-06-06 DIAGNOSIS — C090 Malignant neoplasm of tonsillar fossa: Secondary | ICD-10-CM

## 2021-06-06 MED ORDER — DEXAMETHASONE 4 MG PO TABS: 8.0000 mg | ORAL_TABLET | Freq: Every day | ORAL | 1 refills | Status: DC

## 2021-06-06 MED ORDER — PROCHLORPERAZINE MALEATE 10 MG PO TABS: 10.0000 mg | ORAL_TABLET | Freq: Four times a day (QID) | ORAL | 1 refills | Status: DC | PRN

## 2021-06-06 MED ORDER — LIDOCAINE-PRILOCAINE 2.5-2.5 % EX CREA: TOPICAL_CREAM | CUTANEOUS | 3 refills | Status: DC

## 2021-06-06 MED ORDER — ONDANSETRON HCL 8 MG PO TABS: 8.0000 mg | ORAL_TABLET | Freq: Two times a day (BID) | ORAL | 1 refills | Status: DC | PRN

## 2021-06-06 NOTE — Progress Notes (Signed)
Pharmacist Chemotherapy Monitoring - Initial Assessment    Anticipated start date: 06/13/21   The following has been reviewed per standard work regarding the patient's treatment regimen: The patient's diagnosis, treatment plan and drug doses, and organ/hematologic function Lab orders and baseline tests specific to treatment regimen  The treatment plan start date, drug sequencing, and pre-medications Prior authorization status  Patient's documented medication list, including drug-drug interaction screen and prescriptions for anti-emetics and supportive care specific to the treatment regimen The drug concentrations, fluid compatibility, administration routes, and timing of the medications to be used The patient's access for treatment and lifetime cumulative dose history, if applicable  The patient's medication allergies and previous infusion related reactions, if applicable   Changes made to treatment plan:  Comments added in RN Communication re: Ab plan orders  Follow up needed:  Pending authorization for treatment    Tora Kindred, [facility], 06/06/2021  4:26 PM

## 2021-06-06 NOTE — Progress Notes (Signed)
Plan changed to weekly carbotaxol with Q 21 d [place].  Aislyn Hayse

## 2021-06-06 NOTE — Progress Notes (Signed)
OFF PATHWAY REGIMEN - Head and Neck  No Change  Continue With Treatment as Ordered.  Original Decision Date/Time: 02/21/2021 19:50   [id number]:Docetaxel (100 mg/m2):   A cycle is every 21 days:     Docetaxel   **Always confirm dose/schedule in your pharmacy ordering system**  Patient Characteristics: Larynx, Local Recurrence, Not a Candidate for Radiation Therapy, Prior Platinum-Based Chemoradiation within 6 Months, Not a Candidate for Checkpoint Inhibitor Disease Classification: Larynx AJCC T Category: T2 AJCC 8 Stage Grouping: Unknown Therapeutic Status: Local Recurrence AJCC N Category: NX AJCC M Category: M0 Is patient a candidate for radiation therapy<= No Prior Platinum Status: Prior Platinum-Based Chemoradiation within 6 Months Checkpoint Inhibitor Candidacy: Not a Candidate for Checkpoint Inhibitor Intent of Therapy: Non-Curative / Palliative Intent, Discussed with Patient

## 2021-06-06 NOTE — Progress Notes (Deleted)
[name] FOLLOW UP NOTE  Patient Care Team: [name], MD as PCP - General (Internal Medicine) Izora Gala, MD as Consulting Physician (Otolaryngology) [name], MD as Attending Physician (Radiation Oncology) Leota Sauers, RN (Inactive) as Oncology Nurse Navigator Karie Mainland, RD as Dietitian (Nutrition) Valentino Saxon Perry Mount, CCC-SLP as Speech Language Pathologist (Speech Pathology) Kennith Center, LCSW as Social Worker [name], MD as Consulting Physician (Hematology and Oncology) Malmfelt, Stephani Police, RN as Oncology Nurse Navigator  CHIEF COMPLAINTS/PURPOSE OF CONSULTATION:  Follow up after hospital discharge.  ASSESSMENT & PLAN:   No problem-specific Assessment & Plan notes found for this encounter.  No orders of the defined types were placed in this encounter.    HISTORY OF PRESENTING ILLNESS:   [name] 70 y.o. male is here for follow up after hospital discharge.  Oncology History Overview Note    [name] is a 70 y.o. male who has been treated for laryngeal cancer in the past.   Aug 2020, Pt had presented with dysphagia and difficulty breathing through his mouth, 20 lb weight loss. He saw Dr. Erik Obey who performed laryngoscopy and appreciated a 2 to 3 cm ovoid pedunculated mass arising from the right aryepiglottic fold and impinging on the supraglottic airway.  The vocal cords appeared to be mobile.  Urgent panendoscopy and biopsy were recommended to protect his airway.   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 According to his op note, a bulky necrotic and semi-pedunculated tumor coming off the lateral surface of the right aryepiglottic fold and the anterior and lateral aspect of the piriform sinus on the right side.  Airway was compromised by the tumor mass at intubation.   CT scan of the neck on 04/11/2019 showed glottic closure with  no asymmetry of the cords, correlate with laryngoscopy results.  Questionable right level 2 lymph node measured 7 mm.  No definite pathologic lymph nodes in the neck.   PET scan on 04/22/2019 revealed mild residual activity in the posterior right hypopharynx confined to the mucosa with no evidence of metastatic disease to the neck or distantly.  He underwent definitive radiotherapy to T2N0 laryngeal cancer  since he refused surgical treatment.  He then had PET imaging which showed a right neck lymph node, suspicious on PET. Biopsy of right neck lymph node is positive for recurrence. He underwent right modified radical neck dissection including levels 2 and 3, including the internal jugular vein, suture marks the inferior jugular vein stump.  He most recently underwent a soft tissue neck CT scan on the date of 07/05/2020 that revealed asymmetric soft tissue thickening and enhancement along the right lateral pharynx. It also showed suspicious small 6 mm hyperenhancing nodular soft tissue along the posterior margin of the neck dissect at right level 3. Additionally, there was a mild inflammatory appearing right upper lobe centrilobular ground-glass opacity that was new (consider mild or developing upper lobe infection), post radiation changes to the lung apices, and sequelae of radiation and right neck dissection from previous neck CT.   Subsequently, the patient underwent a direct laryngoscopy and biopsy of oropharyngeal mass on the date of 08/13/2020. Pathology from the procedure revealed poorly differentiated squamous cell carcinoma.  PET showed recurrence of head and neck cancer with broad hypermetabolic pharyngeal mucosal lesion involving right lateral posterior oropharynx and hypopharynx extending from palatine tonsil to the vallecula.Hypermetabolic lymph node posterior sternocleidomastoid muscle on the RIGHT (  level 3).Hypermetabolic nodule within the LEFT parotid glands favored small primary parotid  neoplasm.  Recommendation was to consider proceeding with upfront concurrent chemo radiation followed by salvage surgery if needed. He completed 6 weekly cycles of chemotherapy and concurrent radiation, 10/2020.  PET scan 01/31/2021 with worsening nodal disease in the left neck and a right supraclavicular LN as discussed. Post treatment changes with soft tissue fullness in the area of the prior tumor but with markedly diminished metabolic activity.   He was started on single agent docetaxel given progressive tumor.  He was not thought to be a good candidate for any further radiation or surgery. He was admitted to the hospital from 04/12/2021 to 04/22/2021 with perforated duodenal ulcer.  His cycle 3 chemotherapy was delayed for the above-mentioned reason.  CT imaging from August 25 showed progression of lesion in the right tonsil with central fluid collection and peripheral enhancement.  This was a site of prior recurrence, could represent a progressive tumor or abscess.  There is also progressive malignant lymphadenopathy noted in the left neck.  Stable lymphadenopathy in the right supraclavicular region.  There is new area of stranding in the superior segment of the left lower lobe which is of uncertain etiology.   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 SEE NOTE   04/11/2019 Imaging   CT neck: IMPRESSION: The glottis is closed with suboptimal evaluation of the vocal cords. No asymmetry of the cords. Correlate with laryngoscopy results.   No enlarged lymph nodes in the neck. 7 mm right level 2 lymph  node may be reactive. No definite pathologic lymph nodes in the neck.   04/22/2019 Imaging   PET: IMPRESSION: 1. Mild residual activity in the posterior RIGHT hypopharynx confined to the mucosa. 2. No evidence of hypermetabolic metastatic lymph nodes in LEFT or RIGHT neck. 3. No evidence distant metastatic disease.   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  He was also referred to [facility] for second opinion. I have had a discussion with medical oncology from [facility] and they suggest trying a weekly CarboTaxol with every 6-week Keytruda.  They recommended a carboplatin dose with AUC of 1.5 and taxane 50 mg per metered square but of course only if tolerated.  MEDICAL HISTORY:  Past Medical History:  Diagnosis Date   Cancer ([place])    Throat cancer 2019   ETOH abuse    Frequent urination    Glaucoma    Hepatitis C virus infection without hepatic coma  dx'ed in 11/2018   History of radiation therapy 05/12/19- 07/06/19   Larynx   Hypertension    Wears denture    upper only; lost lower denture    SURGICAL HISTORY: Past Surgical History:  Procedure Laterality Date   ANKLE SURGERY  2011   right ankle   COLONOSCOPY  02/2019   polyps - Dr Havery Moros   DIRECT LARYNGOSCOPY N/A 04/01/2019   Procedure: DIRECT LARYNGOSCOPY WITH BIOPSY;  Surgeon: Jodi Marble, MD;  Location: [facility];   Service: ENT;  Laterality: N/A;   DIRECT LARYNGOSCOPY N/A 01/06/2020   Procedure: DIRECT LARYNGOSCOPY;  Surgeon: Izora Gala, MD;  Location: [place];  Service: ENT;  Laterality: N/A;   DIRECT LARYNGOSCOPY N/A 08/13/2020   Procedure: DIRECT LARYNGOSCOPY;  Surgeon: Izora Gala, MD;  Location: [place];  Service: ENT;  Laterality: N/A;   ESOPHAGOSCOPY N/A 04/01/2019   Procedure: ESOPHAGOSCOPY;  Surgeon: Jodi Marble, MD;  Location: [place];  Service: ENT;  Laterality: N/A;   EXCISION ORAL TUMOR Right 08/13/2020   Procedure: BIOPSY OF OROPHARYNGEAL MASS;  Surgeon: Izora Gala, MD;  Location: [place];  Service: ENT;  Laterality: Right;   EYE SURGERY Right    IR CM INJ ANY COLONIC TUBE W/FLUORO  12/20/2020   IR CM INJ ANY COLONIC TUBE W/FLUORO  03/20/2021   IR GASTROSTOMY TUBE MOD SED  09/24/2020   IR IMAGING GUIDED PORT INSERTION  09/24/2020   KNEE SURGERY     LAPAROTOMY N/A 04/12/2021   Procedure: EXPLORATORY LAPAROTOMY, REPAIR OF DUODENAL ULCER WITH Silvestre Gunner;  Surgeon: Felicie Morn, MD;  Location: [facility];  Service: General;  Laterality: N/A;   LARYNGOSCOPY AND BRONCHOSCOPY N/A 04/01/2019   Procedure: BRONCHOSCOPY;  Surgeon: Jodi Marble, MD;  Location: [place];  Service: ENT;  Laterality: N/A;   RADICAL NECK DISSECTION N/A 01/06/2020   Procedure: RADICAL NECK DISSECTION;  Surgeon: Izora Gala, MD;  Location: [facility] OR;  Service: ENT;  Laterality: N/A;    SOCIAL HISTORY: Social History   Socioeconomic History   Marital status: Single    Spouse name: Not on file   Number of children: 2   Years of education: Not on file   Highest education level: Not on file  Occupational History   Not on file  Tobacco Use   Smoking status: Former    Years: 50.00    Types: Cigarettes    Quit date: 06/01/2020    Years since quitting: 1.0   Smokeless tobacco: Never  Vaping Use   Vaping Use: Never used  Substance and Sexual Activity   Alcohol use: Yes     Alcohol/week: 4.0 standard drinks    Types: 4 Cans of beer per week    Comment: 40oz beer daily   Drug use: Yes    Types: Cocaine    Comment: none in 2 yrs   Sexual activity: Yes    Partners: Female  Other Topics Concern   Not on file  Social History Narrative   Patient is divorced with 2 children.   Patient is currently living with his sister.   Patient with a history of smoking a third of pack of cigarettes daily for 50 years.  Patient currently smoking 2 to 3 cigarettes/day.   Patient has never used smokeless tobacco.   Patient with occasional use of alcohol.   Patient last used cocaine approximately 6 months ago.  Patient denies use of marijuana.   Social Determinants of Radio broadcast assistant  Strain: Not on file  Food Insecurity: No Food Insecurity   Worried About Charity fundraiser in the Last Year: Never true   Ran Out of Food in the Last Year: Never true  Transportation Needs: No Transportation Needs   Lack of Transportation (Medical): No   Lack of Transportation (Non-Medical): No  Physical Activity: Not on file  Stress: Not on file  Social Connections: Unknown   Frequency of Communication with Friends and Family: Three times a week   Frequency of Social Gatherings with Friends and Family: More than three times a week   Attends Religious Services: Not on file   Active Member of Clubs or Organizations: Not on file   Attends Archivist Meetings: Not on file   Marital Status: Not on file  Intimate Partner Violence: Not on file    FAMILY HISTORY: Family History  Problem Relation Age of Onset   Breast cancer Sister    Colon cancer Brother 36       ????   Cancer Brother     ALLERGIES:  has No Known Allergies.  MEDICATIONS:  Current Outpatient Medications  Medication Sig Dispense Refill   atropine 1 % ophthalmic solution Place 1 drop into the right eye 2 (two) times a day.      dexamethasone (DECADRON) 4 MG tablet Take 2 tablets (8 mg total) by  mouth See admin instructions. Start the day before Taxotere, take twice daily, then daily after chemo for 2 days.     guaiFENesin (ROBITUSSIN) 100 MG/5ML SOLN Place 10 mLs (200 mg total) into feeding tube 2 (two) times daily. 236 mL 0   lidocaine (XYLOCAINE) 2 % solution Use as directed 10 mLs in the mouth or throat every 6 (six) hours as needed for mouth pain. Mix 1 part of lidocaine with 1 part of water, swish and swallow 100 mL 2   lidocaine-prilocaine (EMLA) cream Apply 1 application topically daily as needed (access port).     Nutritional Supplements (KATE FARMS STANDARD 1.4) LIQD 487 mLs by Enteral route 3 (three) times daily. 1.5 cartons (487 ml) [employer] 1.4 TID with 60 ml free water before and 60 ml free water after each TF bolus. Provide an additional 120 ml free water TID.     ondansetron (ZOFRAN) 8 MG tablet Take 1 tablet (8 mg total) by mouth 2 (two) times daily as needed for refractory nausea / vomiting. 30 tablet 1   oxyCODONE (OXY IR/ROXICODONE) 5 MG immediate release tablet Take 1 tablet (5 mg total) by mouth every 12 (twelve) hours as needed for severe pain. 60 tablet 0   pantoprazole sodium (PROTONIX) 40 mg/20 mL PACK Place 20 mLs (40 mg total) into feeding tube 2 (two) times daily. 3000 mL 1   prednisoLONE acetate (PRED FORTE) 1 % ophthalmic suspension Place 1 drop into the right eye 4 (four) times daily.     prochlorperazine (COMPAZINE) 10 MG tablet Take 1 tablet (10 mg total) by mouth every 6 (six) hours as needed (Nausea or vomiting). 30 tablet 1   Vitamin D, Ergocalciferol, (DRISDOL) 1.25 MG (50000 UNIT) CAPS capsule Take 50,000 Units by mouth every Saturday.     Water For Irrigation, Sterile (FREE WATER) SOLN Place 100 mLs into feeding tube every 4 (four) hours.     No current facility-administered medications for this visit.    PHYSICAL EXAMINATION:   ECOG PERFORMANCE STATUS: 0 - Asymptomatic  There were no vitals filed for this visit.   There  were no vitals filed  for this visit.   Physical Exam Constitutional:      General: He is not in acute distress.    Comments: Frail   HENT:     Head: Normocephalic.     Mouth/Throat:     Mouth: Mucous membranes are moist.     Pharynx: No oropharyngeal exudate or posterior oropharyngeal erythema.  Neck:     Comments: No palpable lymphadenopathy on exam but as mentioned before, neck exam is challenging on him given postop and radiation-induced fibrosis and scarring. Cardiovascular:     Rate and Rhythm: Normal rate and regular rhythm.  Pulmonary:     Effort: Pulmonary effort is normal.  Abdominal:     General: Abdomen is flat.     Palpations: Abdomen is soft.     Comments: G tube appears ok, no tenderness around the G-tube.  Some drainage doesn't appear purulent    Musculoskeletal:        General: No swelling or tenderness.     Cervical back: Normal range of motion and neck supple. No rigidity.  Skin:    General: Skin is warm and dry.  Neurological:     General: No focal deficit present.     Mental Status: He is alert.  Psychiatric:        Mood and Affect: Mood normal.   No change in exam from last visit.  LABORATORY DATA:  I have reviewed the data as listed Lab Results  Component Value Date   WBC 7.9 05/16/2021   HGB 9.9 (L) 05/16/2021   HCT 29.3 (L) 05/16/2021   MCV 89.3 05/16/2021   PLT 199 05/16/2021     Chemistry      Component Value Date/Time   NA 139 05/16/2021 1155   K 4.1 05/16/2021 1155   CL 103 05/16/2021 1155   CO2 25 05/16/2021 1155   BUN 18 05/16/2021 1155   CREATININE 0.60 (L) 05/16/2021 1155   CREATININE 0.61 (L) 10/26/2019 1523      Component Value Date/Time   CALCIUM 9.6 05/16/2021 1155   ALKPHOS 54 05/16/2021 1155   AST 9 (L) 05/16/2021 1155   ALT 6 05/16/2021 1155   ALT 53 (H) 09/06/2019 1543   BILITOT 0.4 05/16/2021 1155       RADIOGRAPHIC STUDIES: I have personally reviewed the radiological images as listed and agreed with the findings in the  report. CT Soft Tissue Neck W Contrast  Result Date: 05/10/2021 CLINICAL DATA:  Head neck cancer with recurrence EXAM: CT NECK WITH CONTRAST TECHNIQUE: Multidetector CT imaging of the neck was performed using the standard protocol following the bolus administration of intravenous contrast. CONTRAST:  21m OMNIPAQUE IOHEXOL 350 MG/ML SOLN COMPARISON:  CT neck 03/11/2021.  PET-CT 01/31/2021 FINDINGS: Pharynx and larynx: Enlarging lesion in the region of the right tonsil. This measures approximately 12 mm in diameter with central fluid density and surrounding enhancement. This was the site of a recurrent lesion on PET of 08/30/2020 however had improved on the PET of 01/31/2021. This could represent infection or recurrent tumor. Close monitoring recommended. Post radiation changes seen throughout the pharynx and larynx with mucosal edema. Airway appears adequately patent. Salivary glands: Post radiation atrophy in the submandibular gland bilaterally. Negative parotid bilaterally. Thyroid: Negative Lymph nodes: Right supraclavicular lymph node measures 15 x 17 mm and was hypermetabolic on the prior PET. No change in size. Lymph node mass in the left neck with multiple enlarged lymph nodes, measuring 24 x 17 mm,  12 mm, and 8 mm in diameter. These lymph nodes appear more prominent compared with the prior neck CT 03/11/2021 Vascular: Right jugular vein occluded, chronic and unchanged. Left jugular vein patent. Mild atherosclerotic disease in the carotid artery bilaterally. Limited intracranial: Negative Visualized orbits: Incompletely imaged Mastoids and visualized paranasal sinuses: Negative Skeleton: Moderate to advanced cervical spondylosis. No acute skeletal lesion Upper chest: Mild reticular stranding in the left upper lobe anteriorly. New area of stranding in the superior segment of the left lower lobe not present on Jan 31, 2021 PET-CT. Possible inflammation or less likely tumor. Follow-up recommended. Other: None  IMPRESSION: 1. Progression of lesion in the right tonsil with central fluid collection and peripheral enhancement. This was a site of prior recurrent sick could represent progressive tumor or abscess. Continued follow-up recommended. 2. Progressive malignant lymphadenopathy in the left neck. Stable lymphadenopathy right supraclavicular region. 3. New area of stranding in the superior segment left lower lobe but certain etiology. Attention on follow-up recommended. Electronically Signed   By: Franchot Gallo M.D.   On: 05/10/2021 10:40    PDL1 negative < 1%  All questions were answered. The patient knows to call the clinic with any problems, questions or concerns.  I spent 30 minutes in the care of this patient including H and P, review of records, counseling and coordination of care. I reviewed imaging with radiology and overall they are concerned about progression.    [name], MD 06/06/2021 8:17 AM

## 2021-06-06 NOTE — Progress Notes (Signed)
DISCONTINUE OFF PATHWAY REGIMEN - Head and Neck   OFF00006:Docetaxel (100 mg/m2):   A cycle is every 21 days:     Docetaxel   **Always confirm dose/schedule in your pharmacy ordering system**  REASON: Disease Progression PRIOR TREATMENT: Off Pathway: Docetaxel (100 mg/m2) TREATMENT RESPONSE: Progressive Disease (PD)  START OFF PATHWAY REGIMEN - Head and Neck   Custom Intervention:Medical: [Paclitaxel, Carboplatin]:     Paclitaxel      Carboplatin   **Always confirm dose/schedule in your pharmacy ordering system**  Patient Characteristics: Larynx, Local Recurrence, Not a Candidate for Radiation Therapy, Prior Platinum-Based Chemoradiation within 6 Months, Not a Candidate for Checkpoint Inhibitor Disease Classification: Larynx AJCC T Category: T2 AJCC 8 Stage Grouping: Unknown Therapeutic Status: Local Recurrence AJCC N Category: NX AJCC M Category: M0 Is patient a candidate for radiation therapy<= No Prior Platinum Status: Prior Platinum-Based Chemoradiation within 6 Months Checkpoint Inhibitor Candidacy: Not a Candidate for Checkpoint Inhibitor Intent of Therapy: Non-Curative / Palliative Intent, Discussed with Patient

## 2021-06-08 ENCOUNTER — Ambulatory Visit: Payer: Medicare HMO

## 2021-06-11 ENCOUNTER — Telehealth: Payer: Self-pay

## 2021-06-11 ENCOUNTER — Ambulatory Visit: Payer: Medicare HMO | Admitting: Hematology and Oncology

## 2021-06-11 ENCOUNTER — Ambulatory Visit: Payer: Medicare HMO

## 2021-06-11 ENCOUNTER — Other Ambulatory Visit: Payer: Medicare HMO

## 2021-06-11 NOTE — Telephone Encounter (Signed)
Called and spoke w/ patient's caregiver to clarify decadron schedule.

## 2021-06-12 ENCOUNTER — Other Ambulatory Visit: Payer: Medicare HMO

## 2021-06-12 ENCOUNTER — Ambulatory Visit: Payer: Medicare HMO | Admitting: Radiation Oncology

## 2021-06-12 ENCOUNTER — Ambulatory Visit: Payer: Medicare HMO

## 2021-06-12 ENCOUNTER — Ambulatory Visit: Payer: Medicare HMO | Admitting: Hematology and Oncology

## 2021-06-12 MED FILL — Dexamethasone Sodium Phosphate Inj 100 MG/10ML: INTRAMUSCULAR | Qty: 1 | Status: AC

## 2021-06-13 ENCOUNTER — Inpatient Hospital Stay: Payer: Medicare HMO

## 2021-06-13 ENCOUNTER — Inpatient Hospital Stay: Payer: Medicare HMO | Admitting: Hematology and Oncology

## 2021-06-13 ENCOUNTER — Encounter: Payer: Self-pay | Admitting: Hematology and Oncology

## 2021-06-13 ENCOUNTER — Other Ambulatory Visit: Payer: Self-pay

## 2021-06-13 ENCOUNTER — Inpatient Hospital Stay (HOSPITAL_BASED_OUTPATIENT_CLINIC_OR_DEPARTMENT_OTHER): Payer: Medicare HMO | Admitting: Hematology and Oncology

## 2021-06-13 VITALS — BP 162/88 | HR 72 | Temp 98.2°F | Resp 18

## 2021-06-13 DIAGNOSIS — Z5111 Encounter for antineoplastic chemotherapy: Secondary | ICD-10-CM | POA: Diagnosis not present

## 2021-06-13 DIAGNOSIS — C329 Malignant neoplasm of larynx, unspecified: Secondary | ICD-10-CM

## 2021-06-13 DIAGNOSIS — C321 Malignant neoplasm of supraglottis: Secondary | ICD-10-CM

## 2021-06-13 DIAGNOSIS — C090 Malignant neoplasm of tonsillar fossa: Secondary | ICD-10-CM

## 2021-06-13 DIAGNOSIS — G893 Neoplasm related pain (acute) (chronic): Secondary | ICD-10-CM | POA: Diagnosis not present

## 2021-06-13 DIAGNOSIS — Z95828 Presence of other vascular implants and grafts: Secondary | ICD-10-CM

## 2021-06-13 LAB — CMP (CANCER CENTER ONLY)
ALT: <5 U/L (ref 0–44)
AST: 11 U/L — ABNORMAL LOW (ref 15–41)
Albumin: 3.6 g/dL (ref 3.5–5.0)
Alkaline Phosphatase: 51 U/L (ref 38–126)
Anion gap: 11 (ref 5–15)
BUN: 7 mg/dL — ABNORMAL LOW (ref 8–23)
CO2: 26 mmol/L (ref 22–32)
Calcium: 9.9 mg/dL (ref 8.9–10.3)
Chloride: 99 mmol/L (ref 98–111)
Creatinine: 0.61 mg/dL (ref 0.61–1.24)
GFR, Estimated: >60 mL/min (ref >60)
Glucose, Bld: 109 mg/dL — ABNORMAL HIGH (ref 70–99)
Potassium: 3.9 mmol/L (ref 3.5–5.1)
Sodium: 136 mmol/L (ref 135–145)
Total Bilirubin: 0.5 mg/dL (ref 0.3–1.2)
Total Protein: 7.5 g/dL (ref 6.5–8.1)

## 2021-06-13 LAB — CBC WITH DIFFERENTIAL (CANCER CENTER ONLY)
Abs Immature Granulocytes: 0.05 10*3/uL (ref 0.00–0.07)
Basophils Absolute: 0 10*3/uL (ref 0.0–0.1)
Basophils Relative: 1 %
Eosinophils Absolute: 0.1 10*3/uL (ref 0.0–0.5)
Eosinophils Relative: 2 %
HCT: 34.2 % — ABNORMAL LOW (ref 39.0–52.0)
Hemoglobin: 11 g/dL — ABNORMAL LOW (ref 13.0–17.0)
Immature Granulocytes: 1 %
Lymphocytes Relative: 23 %
Lymphs Abs: 1.1 10*3/uL (ref 0.7–4.0)
MCH: 29.3 pg (ref 26.0–34.0)
MCHC: 32.2 g/dL (ref 30.0–36.0)
MCV: 91.2 fL (ref 80.0–100.0)
Monocytes Absolute: 0.4 10*3/uL (ref 0.1–1.0)
Monocytes Relative: 9 %
Neutro Abs: 3.2 10*3/uL (ref 1.7–7.7)
Neutrophils Relative %: 64 %
Platelet Count: 254 10*3/uL (ref 150–400)
RBC: 3.75 MIL/uL — ABNORMAL LOW (ref 4.22–5.81)
RDW: 14.3 % (ref 11.5–15.5)
WBC Count: 5 10*3/uL (ref 4.0–10.5)
nRBC: 0 % (ref 0.0–0.2)

## 2021-06-13 LAB — TSH: TSH: 1.137 u[IU]/mL (ref 0.320–4.118)

## 2021-06-13 MED ORDER — DIPHENHYDRAMINE HCL 50 MG/ML IJ SOLN
50.0000 mg | Freq: Once | INTRAMUSCULAR | Status: AC
  Administered 2021-06-13: 50 mg via INTRAVENOUS
  Filled 2021-06-13: qty 1

## 2021-06-13 MED ORDER — SODIUM CHLORIDE 0.9% FLUSH
10.0000 mL | Freq: Once | INTRAVENOUS | Status: AC
  Administered 2021-06-13: 10 mL

## 2021-06-13 MED ORDER — SODIUM CHLORIDE 0.9 % IV SOLN
50.0000 mg/m2 | Freq: Once | INTRAVENOUS | Status: AC
  Administered 2021-06-13: 78 mg via INTRAVENOUS
  Filled 2021-06-13: qty 13

## 2021-06-13 MED ORDER — SODIUM CHLORIDE 0.9 % IV SOLN
10.0000 mg | Freq: Once | INTRAVENOUS | Status: AC
  Administered 2021-06-13: 10 mg via INTRAVENOUS
  Filled 2021-06-13: qty 10

## 2021-06-13 MED ORDER — SODIUM CHLORIDE 0.9 % IV SOLN
112.0000 mg | Freq: Once | INTRAVENOUS | Status: AC
  Administered 2021-06-13: 110 mg via INTRAVENOUS
  Filled 2021-06-13: qty 11

## 2021-06-13 MED ORDER — SODIUM CHLORIDE 0.9 % IV SOLN: Freq: Once | INTRAVENOUS | Status: DC

## 2021-06-13 MED ORDER — OXYCODONE HCL 5 MG PO TABS: 5.0000 mg | ORAL_TABLET | Freq: Four times a day (QID) | ORAL | 0 refills | Status: DC | PRN

## 2021-06-13 MED ORDER — PALONOSETRON HCL INJECTION 0.25 MG/5ML
0.2500 mg | Freq: Once | INTRAVENOUS | Status: AC
  Administered 2021-06-13: 0.25 mg via INTRAVENOUS
  Filled 2021-06-13: qty 5

## 2021-06-13 MED ORDER — FAMOTIDINE 20 MG IN NS 100 ML IVPB
20.0000 mg | Freq: Once | INTRAVENOUS | Status: AC
  Administered 2021-06-13: 20 mg via INTRAVENOUS
  Filled 2021-06-13: qty 100

## 2021-06-13 MED ORDER — SODIUM CHLORIDE 0.9 % IV SOLN
200.0000 mg | Freq: Once | INTRAVENOUS | Status: AC
  Administered 2021-06-13: 200 mg via INTRAVENOUS
  Filled 2021-06-13: qty 8

## 2021-06-13 MED ORDER — SODIUM CHLORIDE 0.9 % IV SOLN: Freq: Once | INTRAVENOUS | Status: AC

## 2021-06-13 NOTE — Progress Notes (Signed)
[name] FOLLOW UP NOTE  Patient Care Team: [name], MD as PCP - General (Internal Medicine) Izora Gala, MD as Consulting Physician (Otolaryngology) [name], MD as Attending Physician (Radiation Oncology) Leota Sauers, RN (Inactive) as Oncology Nurse Navigator Karie Mainland, RD as Dietitian (Nutrition) Valentino Saxon [place], CCC-SLP as Speech Language Pathologist (Speech Pathology) Kennith Center, LCSW as Social Worker [name], MD as Consulting Physician (Hematology and Oncology) Malmfelt, Stephani Police, RN as Oncology Nurse Navigator  CHIEF COMPLAINTS/PURPOSE OF CONSULTATION:  Follow up on change of chemotherapy  ASSESSMENT & PLAN:   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 He is unfortunately not a candidate for any more radiation and surgery. He was recommended palliative chemotherapy or immunotherapy His performance status was also borderline and he was very frail when he presented hence we have agreed to try single agent chemotherapy. We agreed to try single agent docetaxel at 75% of dose. He had 2 cycles of chemotherapy and was recently admitted for perforated duodenal ulcer.  He had recovered well from the perforated duodenal ulcer however his recent imaging is consistent with progression in both the primary lesion as well as the lymphadenopathy.  During his last visit, we have discussed about progression on docetaxel and recommended considering chemoimmunotherapy.  He also saw Dr. Renaldo Reel for second opinion at [facility] and he wanted to proceed with treatment here.  I had a discussion with Dr. Renaldo Reel who suggested trying weekly CarboTaxol given concern for tolerance  as well as every 21-day Keytruda, reassess response after 2 cycles.  I have once again discussed about this plan today, discussed about adverse effects of chemotherapy including but not limited to fatigue, nausea, vomiting, diarrhea, increased risk of infections, neuropathy etc.  He understands that some of the side effects can be fatal.  We have also discussed about mechanism of action of immunotherapy, adverse effects from immunotherapy which can virtually involve any organ however most of the side effects from immunotherapy are mild to moderate.  We have discussed common side effects from immunotherapy including fatigue, dry skin, hypo-/hyperthyroidism, diarrhea, hepatitis etc.  We have also discussed that less than 5% of the side effects from immunotherapy can be life-threatening.  He is willing to take risks and try this regimen.  He understands that if this regimen does not help him with his current disease, he may have to consider hospice.  Cancer related pain We have discussed about trying pain medication every 6 hours as needed for pain management. Refill orders placed today.  No orders of the defined types were placed in this encounter.    HISTORY OF PRESENTING ILLNESS:   [name] 70 y.o. male is here for follow up after hospital discharge.  Oncology History Overview Note    [name] is a 70 y.o. male who has been treated for laryngeal cancer in the past.   Aug 2020, Pt had presented with dysphagia and difficulty breathing through his mouth, 20 lb weight loss. He saw Dr. Erik Obey who performed laryngoscopy and appreciated a 2 to 3 cm ovoid pedunculated mass arising from the right aryepiglottic fold and impinging on the supraglottic airway.  The vocal cords appeared to be mobile.  Urgent panendoscopy and  biopsy were recommended to protect his airway.   On 04/01/2019 biopsy/debulking revealed squamous cell carcinoma with focal sarcomatoid features, poorly differentiated, P 16  neg. According to his op note, a bulky necrotic and semi-pedunculated tumor coming off the lateral surface of the right aryepiglottic fold and the anterior and lateral aspect of the piriform sinus on the right side.  Airway was compromised by the tumor mass at intubation.   CT scan of the neck on 04/11/2019 showed glottic closure with no asymmetry of the cords, correlate with laryngoscopy results.  Questionable right level 2 lymph node measured 7 mm.  No definite pathologic lymph nodes in the neck.   PET scan on 04/22/2019 revealed mild residual activity in the posterior right hypopharynx confined to the mucosa with no evidence of metastatic disease to the neck or distantly.  He underwent definitive radiotherapy to T2N0 laryngeal cancer  since he refused surgical treatment.  He then had PET imaging which showed a right neck lymph node, suspicious on PET. Biopsy of right neck lymph node is positive for recurrence. He underwent right modified radical neck dissection including levels 2 and 3, including the internal jugular vein, suture marks the inferior jugular vein stump.  He most recently underwent a soft tissue neck CT scan on the date of 07/05/2020 that revealed asymmetric soft tissue thickening and enhancement along the right lateral pharynx. It also showed suspicious small 6 mm hyperenhancing nodular soft tissue along the posterior margin of the neck dissect at right level 3. Additionally, there was a mild inflammatory appearing right upper lobe centrilobular ground-glass opacity that was new (consider mild or developing upper lobe infection), post radiation changes to the lung apices, and sequelae of radiation and right neck dissection from previous neck CT.   Subsequently, the patient underwent a direct laryngoscopy and biopsy of oropharyngeal mass on the date of 08/13/2020. Pathology from the procedure revealed poorly differentiated squamous cell carcinoma.  PET showed recurrence of head and neck  cancer with broad hypermetabolic pharyngeal mucosal lesion involving right lateral posterior oropharynx and hypopharynx extending from palatine tonsil to the vallecula.Hypermetabolic lymph node posterior sternocleidomastoid muscle on the RIGHT (level 3).Hypermetabolic nodule within the LEFT parotid glands favored small primary parotid neoplasm.  Recommendation was to consider proceeding with upfront concurrent chemo radiation followed by salvage surgery if needed. He completed 6 weekly cycles of chemotherapy and concurrent radiation, 10/2020.  PET scan 01/31/2021 with worsening nodal disease in the left neck and a right supraclavicular LN as discussed. Post treatment changes with soft tissue fullness in the area of the prior tumor but with markedly diminished metabolic activity.   He was started on single agent docetaxel given progressive tumor.  He was not thought to be a good candidate for any further radiation or surgery. He was admitted to the hospital from 04/12/2021 to 04/22/2021 with perforated duodenal ulcer.  His cycle 3 chemotherapy was delayed for the above-mentioned reason.  CT imaging from August 25 showed progression of lesion in the right tonsil with central fluid collection and peripheral enhancement.  This was a site of prior recurrence, could represent a progressive tumor or abscess.  There is also progressive malignant lymphadenopathy noted in the left neck.  Stable lymphadenopathy in the right supraclavicular region.  There is new area of stranding in the superior segment of the left lower lobe which is of uncertain etiology.   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 SEE NOTE   04/11/2019 Imaging   CT neck: IMPRESSION: The glottis is closed with suboptimal evaluation of the vocal cords. No asymmetry of the cords. Correlate with laryngoscopy results.   No enlarged lymph nodes in the neck. 7 mm right level 2 lymph node may be reactive. No definite pathologic lymph nodes in the neck.   04/22/2019 Imaging   PET: IMPRESSION: 1. Mild residual activity in the posterior RIGHT hypopharynx confined to the mucosa. 2. No evidence of hypermetabolic metastatic lymph nodes in LEFT or RIGHT neck. 3. No evidence distant metastatic disease.   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[place])   05/02/2019 Cancer Staging   Staging form: Larynx - Supraglottis, AJCC 8th Edition - Clinical: Stage II (cT2, cN0, cM0) - Signed by [name], MD on 05/02/2019   Laryngeal cancer ([place])  01/06/2020 Initial Diagnosis   Laryngeal cancer ([place])   06/13/2021 -  Chemotherapy   Patient is on Treatment Plan :  Paclitaxel + Carboplatin q7d     06/13/2021 -  Chemotherapy   Patient is on Treatment Plan : HEAD/NECK Pembrolizumab Q21D     Cancer of tonsillar fossa ([place])  08/31/2020 Initial Diagnosis   Cancer of tonsillar fossa ([place])   09/25/2020 - 10/30/2020 Chemotherapy          03/05/2021 - 03/28/2021 Chemotherapy          06/13/2021 -  Chemotherapy   Patient is on Treatment Plan :  Paclitaxel + Carboplatin q7d      INTERIM HISTORY  Patient is here for FU  Since his last visit, he met Dr Maxie Better from [facility]. We discussed that since he is willing to try more treatment, one could consider weekly carbo taxol with every 3 weeks [place]. He is now here for C1D1 of the above. He continues to complain of some sore throat however weight has been maintaining.  He has been able to feed himself through the G-tube and he occasionally eats some food and mostly soups and  recently he tried some oysters.  He denies any other complaints besides a sore throat today. Rest of the pertinent 10 point ROS reviewed and negative.  MEDICAL HISTORY:  Past Medical History:  Diagnosis Date   Cancer ([place])    Throat cancer 2019   ETOH abuse    Frequent urination    Glaucoma    Hepatitis C virus infection without hepatic coma    dx'ed in 11/2018   History of radiation therapy 05/12/19- 07/06/19   Larynx   Hypertension    Wears denture    upper only; lost lower denture    SURGICAL HISTORY: Past Surgical History:  Procedure Laterality Date   ANKLE SURGERY  2011   right ankle   COLONOSCOPY  02/2019   polyps - Dr Havery Moros   DIRECT LARYNGOSCOPY N/A 04/01/2019   Procedure: DIRECT LARYNGOSCOPY WITH BIOPSY;  Surgeon: Jodi Marble, MD;  Location: [facility];  Service: ENT;  Laterality: N/A;   DIRECT LARYNGOSCOPY N/A 01/06/2020   Procedure: DIRECT LARYNGOSCOPY;  Surgeon: Izora Gala, MD;  Location: [facility];  Service: ENT;  Laterality: N/A;   DIRECT LARYNGOSCOPY N/A 08/13/2020   Procedure: DIRECT LARYNGOSCOPY;  Surgeon:  Izora Gala, MD;  Location: [facility];  Service: ENT;  Laterality: N/A;   ESOPHAGOSCOPY N/A 04/01/2019   Procedure: ESOPHAGOSCOPY;  Surgeon: Jodi Marble, MD;  Location: [facility];  Service: ENT;  Laterality: N/A;   EXCISION ORAL TUMOR Right 08/13/2020   Procedure: BIOPSY OF OROPHARYNGEAL MASS;  Surgeon: Izora Gala, MD;  Location: [facility];  Service: ENT;  Laterality: Right;   EYE SURGERY Right    IR CM INJ ANY COLONIC TUBE W/FLUORO  12/20/2020   IR CM INJ ANY COLONIC TUBE W/FLUORO  03/20/2021   IR GASTROSTOMY TUBE MOD SED  09/24/2020   IR IMAGING GUIDED PORT INSERTION  09/24/2020   KNEE SURGERY     LAPAROTOMY N/A 04/12/2021   Procedure: EXPLORATORY LAPAROTOMY, REPAIR OF DUODENAL ULCER WITH Silvestre Gunner;  Surgeon: Stechschulte, Nickola Major, MD;  Location: [facility];  Service: General;  Laterality: N/A;   LARYNGOSCOPY AND BRONCHOSCOPY N/A  04/01/2019   Procedure: BRONCHOSCOPY;  Surgeon: Jodi Marble, MD;  Location: [facility];  Service: ENT;  Laterality: N/A;   RADICAL NECK DISSECTION N/A 01/06/2020   Procedure: RADICAL NECK DISSECTION;  Surgeon: Izora Gala, MD;  Location: [facility] OR;  Service: ENT;  Laterality: N/A;    SOCIAL HISTORY: Social History   Socioeconomic History   Marital status: Single    Spouse name: Not on file   Number of children: 2   Years of education: Not on file   Highest education level: Not on file  Occupational History   Not on file  Tobacco Use   Smoking status: Former    Years: 50.00    Types: Cigarettes    Quit date: 06/01/2020    Years since quitting: 1.0   Smokeless tobacco: Never  Vaping Use   Vaping Use: Never used  Substance and Sexual Activity   Alcohol use: Yes    Alcohol/week: 4.0 standard drinks    Types: 4 Cans of beer per week    Comment: 40oz beer daily   Drug use: Yes    Types: Cocaine    Comment: none in 2 yrs   Sexual activity: Yes    Partners: Female  Other Topics Concern   Not on file  Social History Narrative   Patient is divorced with 2 children.   Patient is currently living with his sister.   Patient with a history of smoking a third of pack of cigarettes daily for 50 years.  Patient currently smoking 2 to 3 cigarettes/day.   Patient has never used smokeless tobacco.   Patient with occasional use of alcohol.   Patient last used cocaine approximately 6 months ago.  Patient denies use of marijuana.   Social Determinants of Health   Financial Resource Strain: Not on file  Food Insecurity: No Food Insecurity   Worried About Charity fundraiser in the Last Year: Never true   Ran Out of Food in the Last Year: Never true  Transportation Needs: No Transportation Needs   Lack of Transportation (Medical): No   Lack of Transportation (Non-Medical): No  Physical Activity: Not on file  Stress: Not on file  Social Connections: Unknown   Frequency of Communication with  Friends and Family: Three times a week   Frequency of Social Gatherings with Friends and Family: More than three times a week   Attends Religious Services: Not on file   Active Member of Clubs or Organizations: Not on file   Attends Archivist Meetings: Not on file  Marital Status: Not on file  Intimate Partner Violence: Not on file    FAMILY HISTORY: Family History  Problem Relation Age of Onset   Breast cancer Sister    Colon cancer Brother 25       ????   Cancer Brother     ALLERGIES:  has No Known Allergies.  MEDICATIONS:  Current Outpatient Medications  Medication Sig Dispense Refill   atropine 1 % ophthalmic solution Place 1 drop into the right eye 2 (two) times a day.      dexamethasone (DECADRON) 4 MG tablet Take 2 tablets (8 mg total) by mouth daily. Start the day after chemotherapy for 2 days. 30 tablet 1   guaiFENesin (ROBITUSSIN) 100 MG/5ML SOLN Place 10 mLs (200 mg total) into feeding tube 2 (two) times daily. 236 mL 0   lidocaine (XYLOCAINE) 2 % solution Use as directed 10 mLs in the mouth or throat every 6 (six) hours as needed for mouth pain. Mix 1 part of lidocaine with 1 part of water, swish and swallow 100 mL 2   lidocaine-prilocaine (EMLA) cream Apply to affected area once 30 g 3   Nutritional Supplements (KATE FARMS STANDARD 1.4) LIQD 487 mLs by Enteral route 3 (three) times daily. 1.5 cartons (487 ml) [employer] 1.4 TID with 60 ml free water before and 60 ml free water after each TF bolus. Provide an additional 120 ml free water TID.     ondansetron (ZOFRAN) 8 MG tablet Take 1 tablet (8 mg total) by mouth 2 (two) times daily as needed for refractory nausea / vomiting. Start on day 3 after chemo. 30 tablet 1   pantoprazole sodium (PROTONIX) 40 mg/20 mL PACK Place 20 mLs (40 mg total) into feeding tube 2 (two) times daily. 3000 mL 1   prednisoLONE acetate (PRED FORTE) 1 % ophthalmic suspension Place 1 drop into the right eye 4 (four) times daily.      prochlorperazine (COMPAZINE) 10 MG tablet Take 1 tablet (10 mg total) by mouth every 6 (six) hours as needed (Nausea or vomiting). 30 tablet 1   Vitamin D, Ergocalciferol, (DRISDOL) 1.25 MG (50000 UNIT) CAPS capsule Take 50,000 Units by mouth every Saturday.     Water For Irrigation, Sterile (FREE WATER) SOLN Place 100 mLs into feeding tube every 4 (four) hours.     oxyCODONE (OXY IR/ROXICODONE) 5 MG immediate release tablet Take 1 tablet (5 mg total) by mouth every 6 (six) hours as needed for severe pain. 120 tablet 0   No current facility-administered medications for this visit.    PHYSICAL EXAMINATION:   ECOG PERFORMANCE STATUS: 0 - Asymptomatic  Vitals:   06/13/21 1025  BP: (!) 145/105  Pulse: 80  Resp: 18  Temp: (!) 95.1 F (35.1 C)  SpO2: 98%    Filed Weights   06/13/21 1025  Weight: 108 lb 4 oz (49.1 kg)    Physical Exam Constitutional:      General: He is not in acute distress.    Comments: Frail   HENT:     Head: Normocephalic.     Mouth/Throat:     Mouth: Mucous membranes are moist.     Pharynx: No oropharyngeal exudate or posterior oropharyngeal erythema.  Neck:     Comments: No palpable lymphadenopathy on exam, tender on examination bilaterally. Cardiovascular:     Rate and Rhythm: Normal rate and regular rhythm.  Pulmonary:     Effort: Pulmonary effort is normal.  Abdominal:     General:  Abdomen is flat.     Palpations: Abdomen is soft.     Comments: G tube appears ok, no tenderness around the G-tube.   Musculoskeletal:        General: No swelling or tenderness.     Cervical back: Normal range of motion and neck supple. No rigidity.  Skin:    General: Skin is warm and dry.  Neurological:     General: No focal deficit present.     Mental Status: He is alert.  Psychiatric:        Mood and Affect: Mood normal.   No change in exam from last visit.  LABORATORY DATA:  I have reviewed the data as listed Lab Results  Component Value Date   WBC 5.0  06/13/2021   HGB 11.0 (L) 06/13/2021   HCT 34.2 (L) 06/13/2021   MCV 91.2 06/13/2021   PLT 254 06/13/2021     Chemistry      Component Value Date/Time   NA 136 06/13/2021 0957   K 3.9 06/13/2021 0957   CL 99 06/13/2021 0957   CO2 26 06/13/2021 0957   BUN 7 (L) 06/13/2021 0957   CREATININE 0.61 06/13/2021 0957   CREATININE 0.61 (L) 10/26/2019 1523      Component Value Date/Time   CALCIUM 9.9 06/13/2021 0957   ALKPHOS 51 06/13/2021 0957   AST 11 (L) 06/13/2021 0957   ALT <5 06/13/2021 0957   ALT 53 (H) 09/06/2019 1543   BILITOT 0.5 06/13/2021 0957     CBC reviewed okay to proceed, CMP and TSH pending at this time.  RADIOGRAPHIC STUDIES: I have personally reviewed the radiological images as listed and agreed with the findings in the report. No results found.  PDL1 negative < 1%  All questions were answered. The patient knows to call the clinic with any problems, questions or concerns.  I spent 30 minutes in the care of this patient including H and P, review of records, counseling and coordination of care. We have reviewed current treatment plan today which is weekly CarboTaxol with every 21-day Keytruda.  I discussed adverse effects from chemotherapy as well as adverse effects of immunotherapy and plan to repeat imaging after 2 cycles.    [name], MD 06/13/2021 10:54 AM

## 2021-06-13 NOTE — Assessment & Plan Note (Signed)
We have discussed about trying pain medication every 6 hours as needed for pain management. Refill orders placed today.

## 2021-06-13 NOTE — Patient Instructions (Signed)
[facility] ONCOLOGY  Discharge Instructions: Thank you for choosing [facility] to provide your oncology and hematology care.   If you have a lab appointment with the [facility], please go directly to the [facility] and check in at the registration area.   Wear comfortable clothing and clothing appropriate for easy access to any Portacath or PICC line.   We strive to give you quality time with your provider. You may need to reschedule your appointment if you arrive late (15 or more minutes).  Arriving late affects you and other patients whose appointments are after yours.  Also, if you miss three or more appointments without notifying the office, you may be dismissed from the clinic at the provider's discretion.      For prescription refill requests, have your pharmacy contact our office and allow 72 hours for refills to be completed.    Today you received the following chemotherapy and/or immunotherapy agents : Taxol, Carboplatin, Keytruda    To help prevent nausea and vomiting after your treatment, we encourage you to take your nausea medication as directed.  BELOW ARE SYMPTOMS THAT SHOULD BE REPORTED IMMEDIATELY: *FEVER GREATER THAN 100.4 F (38 C) OR HIGHER *CHILLS OR SWEATING *NAUSEA AND VOMITING THAT IS NOT CONTROLLED WITH YOUR NAUSEA MEDICATION *UNUSUAL SHORTNESS OF BREATH *UNUSUAL BRUISING OR BLEEDING *URINARY PROBLEMS (pain or burning when urinating, or frequent urination) *BOWEL PROBLEMS (unusual diarrhea, constipation, pain near the anus) TENDERNESS IN MOUTH AND THROAT WITH OR WITHOUT PRESENCE OF ULCERS (sore throat, sores in mouth, or a toothache) UNUSUAL RASH, SWELLING OR PAIN  UNUSUAL VAGINAL DISCHARGE OR ITCHING   Items with * indicate a potential emergency and should be followed up as soon as possible or go to the Emergency Department if any problems should occur.  Please show the CHEMOTHERAPY ALERT CARD or IMMUNOTHERAPY ALERT  CARD at check-in to the Emergency Department and triage nurse.  Should you have questions after your visit or need to cancel or reschedule your appointment, please contact [facility]  Dept: [phone]  and follow the prompts.  Office hours are 8:00 a.m. to 4:30 p.m. Monday - Friday. Please note that voicemails left after 4:00 p.m. may not be returned until the following business day.  We are closed weekends and major holidays. You have access to a nurse at all times for urgent questions. Please call the main number to the clinic Dept: [phone] and follow the prompts.   For any non-urgent questions, you may also contact your provider using MyChart. We now offer e-Visits for anyone 56 and older to request care online for non-urgent symptoms. For details visit [email or web address].   Also download the MyChart app! Go to the app store, search "MyChart", open the app, select [facility], and log in with your MyChart username and password.  Due to Covid, a mask is required upon entering the hospital/clinic. If you do not have a mask, one will be given to you upon arrival. For doctor visits, patients may have 1 support person aged 76 or older with them. For treatment visits, patients cannot have anyone with them due to current Covid guidelines and our immunocompromised population.

## 2021-06-13 NOTE — Assessment & Plan Note (Signed)
This is a very 66 pleasant male patient with laryngeal cancer s/p previous definitive RT in 2020 and most recently had concurrent CRT completed 10/2020 He had end of treatment PET scan which showed bilateral lymphadenopathy which is concerning for residual disease. He is unfortunately not a candidate for any more radiation and surgery. He was recommended palliative chemotherapy or immunotherapy His performance status was also borderline and he was very frail when he presented hence we have agreed to try single agent chemotherapy. We agreed to try single agent docetaxel at 75% of dose. He had 2 cycles of chemotherapy and was recently admitted for perforated duodenal ulcer.  He had recovered well from the perforated duodenal ulcer however his recent imaging is consistent with progression in both the primary lesion as well as the lymphadenopathy.  During his last visit, we have discussed about progression on docetaxel and recommended considering chemoimmunotherapy.  He also saw Dr. Renaldo Reel for second opinion at [facility] and he wanted to proceed with treatment here.  I had a discussion with Dr. Renaldo Reel who suggested trying weekly CarboTaxol given concern for tolerance as well as every 21-day Keytruda, reassess response after 2 cycles.  I have once again discussed about this plan today, discussed about adverse effects of chemotherapy including but not limited to fatigue, nausea, vomiting, diarrhea, increased risk of infections, neuropathy etc.  He understands that some of the side effects can be fatal.  We have also discussed about mechanism of action of immunotherapy, adverse effects from immunotherapy which can virtually involve any organ however most of the side effects from immunotherapy are mild to moderate.  We have discussed common side effects from immunotherapy including fatigue, dry skin, hypo-/hyperthyroidism, diarrhea, hepatitis etc.  We have also discussed that less than 5% of the side effects from  immunotherapy can be life-threatening.  He is willing to take risks and try this regimen.  He understands that if this regimen does not help him with his current disease, he may have to consider hospice.

## 2021-06-14 ENCOUNTER — Inpatient Hospital Stay: Payer: Medicare HMO

## 2021-06-14 ENCOUNTER — Telehealth: Payer: Self-pay | Admitting: *Deleted

## 2021-06-14 NOTE — Telephone Encounter (Signed)
Called & spoke with pt's caregiver & pt to see how he did with his treatment yest.  He states OK but mentioned diarrhea x 3 yest eve & once this am.  He attributes to mixing some chocolate milk with his food yest.  Informed of imodium & suggest he have on hand if needed so that he doesn't get dehydrated.  He denies any other symptoms.  Reviewed next appts.  Pt expressed understanding

## 2021-06-15 ENCOUNTER — Ambulatory Visit: Payer: Medicare HMO

## 2021-06-17 ENCOUNTER — Telehealth: Payer: Self-pay

## 2021-06-17 NOTE — Telephone Encounter (Signed)
Returned patient caregiver's call regarding patient's diarrhea and neck swelling. Encouraged use of imodium for diarrhea. Discussed s/s of dehydration to watch out for. Regarding neck swelling, patient stated it's the same swelling as before. Encouraged him to follow up with Dr Chryl Heck about it during his next office visit, or to proceed to ED if it becomes difficult to breathe or swallow. Verbalized understanding.

## 2021-06-19 ENCOUNTER — Other Ambulatory Visit: Payer: Medicare HMO

## 2021-06-19 ENCOUNTER — Ambulatory Visit: Payer: Medicare HMO

## 2021-06-19 ENCOUNTER — Ambulatory Visit: Payer: Medicare HMO | Admitting: Hematology and Oncology

## 2021-06-20 ENCOUNTER — Inpatient Hospital Stay (HOSPITAL_BASED_OUTPATIENT_CLINIC_OR_DEPARTMENT_OTHER): Payer: Medicare HMO | Admitting: Hematology and Oncology

## 2021-06-20 ENCOUNTER — Encounter: Payer: Self-pay | Admitting: Hematology and Oncology

## 2021-06-20 ENCOUNTER — Inpatient Hospital Stay: Payer: Medicare HMO

## 2021-06-20 ENCOUNTER — Inpatient Hospital Stay: Payer: Medicare HMO | Attending: Hematology and Oncology

## 2021-06-20 ENCOUNTER — Other Ambulatory Visit: Payer: Self-pay

## 2021-06-20 VITALS — HR 94

## 2021-06-20 DIAGNOSIS — G893 Neoplasm related pain (acute) (chronic): Secondary | ICD-10-CM

## 2021-06-20 DIAGNOSIS — Z931 Gastrostomy status: Secondary | ICD-10-CM | POA: Diagnosis not present

## 2021-06-20 DIAGNOSIS — Z5111 Encounter for antineoplastic chemotherapy: Secondary | ICD-10-CM | POA: Insufficient documentation

## 2021-06-20 DIAGNOSIS — G620 Drug-induced polyneuropathy: Secondary | ICD-10-CM | POA: Insufficient documentation

## 2021-06-20 DIAGNOSIS — Z923 Personal history of irradiation: Secondary | ICD-10-CM | POA: Insufficient documentation

## 2021-06-20 DIAGNOSIS — C090 Malignant neoplasm of tonsillar fossa: Secondary | ICD-10-CM

## 2021-06-20 DIAGNOSIS — C321 Malignant neoplasm of supraglottis: Secondary | ICD-10-CM

## 2021-06-20 DIAGNOSIS — C329 Malignant neoplasm of larynx, unspecified: Secondary | ICD-10-CM

## 2021-06-20 DIAGNOSIS — Z23 Encounter for immunization: Secondary | ICD-10-CM

## 2021-06-20 DIAGNOSIS — R634 Abnormal weight loss: Secondary | ICD-10-CM

## 2021-06-20 DIAGNOSIS — K521 Toxic gastroenteritis and colitis: Secondary | ICD-10-CM

## 2021-06-20 DIAGNOSIS — Z79899 Other long term (current) drug therapy: Secondary | ICD-10-CM | POA: Diagnosis not present

## 2021-06-20 DIAGNOSIS — T451X5A Adverse effect of antineoplastic and immunosuppressive drugs, initial encounter: Secondary | ICD-10-CM | POA: Insufficient documentation

## 2021-06-20 DIAGNOSIS — Z95828 Presence of other vascular implants and grafts: Secondary | ICD-10-CM

## 2021-06-20 LAB — CBC WITH DIFFERENTIAL (CANCER CENTER ONLY)
Abs Immature Granulocytes: 0.05 10*3/uL (ref 0.00–0.07)
Basophils Absolute: 0 10*3/uL (ref 0.0–0.1)
Basophils Relative: 0 %
Eosinophils Absolute: 0.1 10*3/uL (ref 0.0–0.5)
Eosinophils Relative: 1 %
HCT: 32.5 % — ABNORMAL LOW (ref 39.0–52.0)
Hemoglobin: 10.5 g/dL — ABNORMAL LOW (ref 13.0–17.0)
Immature Granulocytes: 1 %
Lymphocytes Relative: 11 %
Lymphs Abs: 1 10*3/uL (ref 0.7–4.0)
MCH: 29.5 pg (ref 26.0–34.0)
MCHC: 32.3 g/dL (ref 30.0–36.0)
MCV: 91.3 fL (ref 80.0–100.0)
Monocytes Absolute: 0.5 10*3/uL (ref 0.1–1.0)
Monocytes Relative: 6 %
Neutro Abs: 7 10*3/uL (ref 1.7–7.7)
Neutrophils Relative %: 81 %
Platelet Count: 241 10*3/uL (ref 150–400)
RBC: 3.56 MIL/uL — ABNORMAL LOW (ref 4.22–5.81)
RDW: 14.3 % (ref 11.5–15.5)
WBC Count: 8.7 10*3/uL (ref 4.0–10.5)
nRBC: 0 % (ref 0.0–0.2)

## 2021-06-20 LAB — CMP (CANCER CENTER ONLY)
ALT: 6 U/L (ref 0–44)
AST: 9 U/L — ABNORMAL LOW (ref 15–41)
Albumin: 3.5 g/dL (ref 3.5–5.0)
Alkaline Phosphatase: 52 U/L (ref 38–126)
Anion gap: 8 (ref 5–15)
BUN: 13 mg/dL (ref 8–23)
CO2: 27 mmol/L (ref 22–32)
Calcium: 9.5 mg/dL (ref 8.9–10.3)
Chloride: 101 mmol/L (ref 98–111)
Creatinine: 0.75 mg/dL (ref 0.61–1.24)
GFR, Estimated: >60 mL/min (ref >60)
Glucose, Bld: 132 mg/dL — ABNORMAL HIGH (ref 70–99)
Potassium: 3.9 mmol/L (ref 3.5–5.1)
Sodium: 136 mmol/L (ref 135–145)
Total Bilirubin: 0.5 mg/dL (ref 0.3–1.2)
Total Protein: 7.4 g/dL (ref 6.5–8.1)

## 2021-06-20 MED ORDER — SODIUM CHLORIDE 0.9 % IV SOLN
50.0000 mg/m2 | Freq: Once | INTRAVENOUS | Status: AC
  Administered 2021-06-20: 78 mg via INTRAVENOUS
  Filled 2021-06-20: qty 13

## 2021-06-20 MED ORDER — SODIUM CHLORIDE 0.9 % IV SOLN: Freq: Once | INTRAVENOUS | Status: AC

## 2021-06-20 MED ORDER — FAMOTIDINE 20 MG IN NS 100 ML IVPB
20.0000 mg | Freq: Once | INTRAVENOUS | Status: AC
Start: 2021-06-20 — End: 2021-06-20
  Administered 2021-06-20: 20 mg via INTRAVENOUS
  Filled 2021-06-20: qty 100

## 2021-06-20 MED ORDER — HEPARIN SOD (PORK) LOCK FLUSH 100 UNIT/ML IV SOLN
500.0000 [IU] | Freq: Once | INTRAVENOUS | Status: AC | PRN
  Administered 2021-06-20: 500 [IU]

## 2021-06-20 MED ORDER — SODIUM CHLORIDE 0.9% FLUSH
10.0000 mL | Freq: Once | INTRAVENOUS | Status: AC
  Administered 2021-06-20: 10 mL

## 2021-06-20 MED ORDER — SODIUM CHLORIDE 0.9 % IV SOLN
10.0000 mg | Freq: Once | INTRAVENOUS | Status: AC
  Administered 2021-06-20: 10 mg via INTRAVENOUS
  Filled 2021-06-20: qty 10

## 2021-06-20 MED ORDER — SODIUM CHLORIDE 0.9 % IV SOLN
112.0500 mg | Freq: Once | INTRAVENOUS | Status: AC
  Administered 2021-06-20: 110 mg via INTRAVENOUS
  Filled 2021-06-20: qty 11

## 2021-06-20 MED ORDER — DIPHENHYDRAMINE HCL 50 MG/ML IJ SOLN
50.0000 mg | Freq: Once | INTRAMUSCULAR | Status: AC
  Administered 2021-06-20: 50 mg via INTRAVENOUS
  Filled 2021-06-20: qty 1

## 2021-06-20 MED ORDER — INFLUENZA VAC A&B SA ADJ QUAD 0.5 ML IM PRSY
0.5000 mL | PREFILLED_SYRINGE | Freq: Once | INTRAMUSCULAR | Status: AC
  Administered 2021-06-20: 0.5 mL via INTRAMUSCULAR
  Filled 2021-06-20: qty 0.5

## 2021-06-20 MED ORDER — SODIUM CHLORIDE 0.9% FLUSH
10.0000 mL | INTRAVENOUS | Status: DC | PRN
  Administered 2021-06-20: 10 mL

## 2021-06-20 MED ORDER — PALONOSETRON HCL INJECTION 0.25 MG/5ML
0.2500 mg | Freq: Once | INTRAVENOUS | Status: AC
  Administered 2021-06-20: 0.25 mg via INTRAVENOUS
  Filled 2021-06-20: qty 5

## 2021-06-20 NOTE — Assessment & Plan Note (Signed)
We have discussed about trying pain medication every 6 hours as needed for pain management. No change in pain medicine needs Will continue to monitor.

## 2021-06-20 NOTE — Assessment & Plan Note (Signed)
Lost about 3 lbs since last visit. Encouraged G tube feedings as recommended Will continue to monitor.

## 2021-06-20 NOTE — Assessment & Plan Note (Signed)
This is a very 74 pleasant male patient with laryngeal cancer s/p previous definitive RT in 2020 and most recently had concurrent CRT completed 10/2020. He had end of treatment PET scan which showed bilateral lymphadenopathy which is concerning for residual disease. He is unfortunately not a candidate for any more radiation and surgery. He was recommended palliative chemotherapy or immunotherapy His performance status was also borderline and he was very frail when he presented hence we have agreed to try single agent chemotherapy. We agreed to try single agent docetaxel at 75% of dose. He had 2 cycles of chemotherapy and was recently admitted for perforated duodenal ulcer.  He had recovered well from the perforated duodenal ulcer however his recent imaging is consistent with progression in both the primary lesion as well as the lymphadenopathy.  During his last visit, we have discussed about progression on docetaxel and recommended considering chemoimmunotherapy.  He also saw Dr. Maxie Better for second opinion at [facility] and he wanted to proceed with treatment here.  I had a discussion with Dr. Maxie Better who suggested trying weekly CarboTaxol given concern for tolerance as well as every 21-day Keytruda, reassess response after 2 cycles. We have discussed common side effects from immunotherapy including fatigue, dry skin, hypo-/hyperthyroidism, diarrhea, hepatitis etc.  We have also discussed that less than 5% of the side effects from immunotherapy can be life-threatening.  He is willing to take risks and try this regimen.  He understands that if this regimen does not help him with his current disease, he may have to consider hospice.  He started C1D1 on 06/13/2021 ROS with some ongoing sore throat, ear ache and some tongue swelling. He is not an excellent historian, couldn't elaborate further on tongue swelling. No breathing changes. PE, again some asymmetry in left upper neck, tongue doesn't appear swollen to  me on exam, poor inspiratory effort, no adv sounds, no abdominal findings. His labs have been reviewed, okay to proceed with planned chemotherapy.  He was again encouraged to contact us with any new questions or concerns.  Anticipate repeat imaging after 2 complete cycles of chemoimmunotherapy.

## 2021-06-20 NOTE — Assessment & Plan Note (Signed)
Grade 1, has about 4 bowel movements a day.  Will recommend Imodium PRN If greater than 5 bowel movements a day, he was recommended to contact us.

## 2021-06-20 NOTE — Progress Notes (Signed)
[place] FOLLOW UP NOTE  Patient Care Team: [name], MD as PCP - General (Internal Medicine) Izora Gala, MD as Consulting Physician (Otolaryngology) [name], MD as Attending Physician (Radiation Oncology) Leota Sauers, RN (Inactive) as Oncology Nurse Navigator Karie Mainland, RD as Dietitian (Nutrition) Valentino Saxon [place], CCC-SLP as Speech Language Pathologist (Speech Pathology) Kennith Center, LCSW as Social Worker [name], MD as Consulting Physician (Hematology and Oncology) Mal[place]elt, Stephani Police, RN as Oncology Nurse Navigator  CHIEF COMPLAINTS/PURPOSE OF CONSULTATION:  Follow up on change of chemotherapy  ASSESSMENT & PLAN:   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 He had end of treatment PET scan which showed bilateral lymphadenopathy which is concerning for residual disease. He is unfortunately not a candidate for any more radiation and surgery. He was recommended palliative chemotherapy or immunotherapy His performance status was also borderline and he was very frail when he presented hence we have agreed to try single agent chemotherapy. We agreed to try single agent docetaxel at 75% of dose. He had 2 cycles of chemotherapy and was recently admitted for perforated duodenal ulcer.  He had recovered well from the perforated duodenal ulcer however his recent imaging is consistent with progression in both the primary lesion as well as the lymphadenopathy.  During his last visit, we have discussed about progression on docetaxel and recommended considering chemoimmunotherapy.  He also saw Dr. Maxie Better for second opinion at [facility] and he wanted to proceed with treatment here.  I had a discussion with Dr. Maxie Better who suggested trying weekly CarboTaxol given concern for  tolerance as well as every 21-day Keytruda, reassess response after 2 cycles. We have discussed common side effects from immunotherapy including fatigue, dry skin, hypo-/hyperthyroidism, diarrhea, hepatitis etc.  We have also discussed that less than 5% of the side effects from immunotherapy can be life-threatening.  He is willing to take risks and try this regimen.  He understands that if this regimen does not help him with his current disease, he may have to consider hospice.  He started C1D1 on 06/13/2021 ROS with some ongoing sore throat, ear ache and some tongue swelling. He is not an excellent historian, couldn't elaborate further on tongue swelling. No breathing changes. PE, again some asymmetry in left upper neck, tongue doesn't appear swollen to me on exam, poor inspiratory effort, no adv sounds, no abdominal findings. His labs have been reviewed, okay to proceed with planned chemotherapy.  He was again encouraged to contact us with any new questions or concerns.  Anticipate repeat imaging after 2 complete cycles of chemoimmunotherapy.  Chemotherapy induced diarrhea Grade 1, has about 4 bowel movements a day.  Will recommend Imodium PRN If greater than 5 bowel movements a day, he was recommended to contact us.  Cancer related pain We have discussed about trying pain medication every 6 hours as needed for pain management. No change in pain medicine needs Will continue to monitor.  Weight loss, unintentional Lost about 3 lbs since last visit. Encouraged G tube feedings as recommended Will continue to monitor.  No orders of the defined types were placed in this encounter.    HISTORY OF PRESENTING ILLNESS:   [name] 70 y.o. male is here for follow up after hospital discharge.  Oncology History Overview Note    [name]  is a 70 y.o. male who has been treated for laryngeal cancer in the past.   Aug 2020, Pt had presented with dysphagia and difficulty breathing through  his mouth, 20 lb weight loss. He saw Dr. Erik Obey who performed laryngoscopy and appreciated a 2 to 3 cm ovoid pedunculated mass arising from the right aryepiglottic fold and impinging on the supraglottic airway.  The vocal cords appeared to be mobile.  Urgent panendoscopy and biopsy were recommended to protect his airway.   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 According to his op note, a bulky necrotic and semi-pedunculated tumor coming off the lateral surface of the right aryepiglottic fold and the anterior and lateral aspect of the piriform sinus on the right side.  Airway was compromised by the tumor mass at intubation.   CT scan of the neck on 04/11/2019 showed glottic closure with no asymmetry of the cords, correlate with laryngoscopy results.  Questionable right level 2 lymph node measured 7 mm.  No definite pathologic lymph nodes in the neck.   PET scan on 04/22/2019 revealed mild residual activity in the posterior right hypopharynx confined to the mucosa with no evidence of metastatic disease to the neck or distantly.  He underwent definitive radiotherapy to T2N0 laryngeal cancer  since he refused surgical treatment.  He then had PET imaging which showed a right neck lymph node, suspicious on PET. Biopsy of right neck lymph node is positive for recurrence. He underwent right modified radical neck dissection including levels 2 and 3, including the internal jugular vein, suture marks the inferior jugular vein stump.  He most recently underwent a soft tissue neck CT scan on the date of 07/05/2020 that revealed asymmetric soft tissue thickening and enhancement along the right lateral pharynx. It also showed suspicious small 6 mm hyperenhancing nodular soft tissue along the posterior margin of the neck dissect at right level 3. Additionally, there was a mild inflammatory appearing right upper lobe centrilobular  ground-glass opacity that was new (consider mild or developing upper lobe infection), post radiation changes to the lung apices, and sequelae of radiation and right neck dissection from previous neck CT.   Subsequently, the patient underwent a direct laryngoscopy and biopsy of oropharyngeal mass on the date of 08/13/2020. Pathology from the procedure revealed poorly differentiated squamous cell carcinoma.  PET showed recurrence of head and neck cancer with broad hypermetabolic pharyngeal mucosal lesion involving right lateral posterior oropharynx and hypopharynx extending from palatine tonsil to the vallecula.Hypermetabolic lymph node posterior sternocleidomastoid muscle on the RIGHT (level 3).Hypermetabolic nodule within the LEFT parotid glands favored small primary parotid neoplasm.  Recommendation was to consider proceeding with upfront concurrent chemo radiation followed by salvage surgery if needed. He completed 6 weekly cycles of chemotherapy and concurrent radiation, 10/2020.  PET scan 01/31/2021 with worsening nodal disease in the left neck and a right supraclavicular LN as discussed. Post treatment changes with soft tissue fullness in the area of the prior tumor but with markedly diminished metabolic activity.   He was started on single agent docetaxel given progressive tumor.  He was not thought to be a good candidate for any further radiation or surgery. He was admitted to the hospital from 04/12/2021 to 04/22/2021 with perforated duodenal ulcer.  His cycle 3 chemotherapy was delayed for the above-mentioned reason.  CT imaging from August 25 showed progression of lesion in the right tonsil with central fluid collection and peripheral enhancement.  This was  a site of prior recurrence, could represent a progressive tumor or abscess.  There is also progressive malignant lymphadenopathy noted in the left neck.  Stable lymphadenopathy in the right supraclavicular region.  There is new area of  stranding in the superior segment of the left lower lobe which is of uncertain etiology.   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 SEE NOTE   04/11/2019 Imaging   CT neck: IMPRESSION: The glottis is closed with suboptimal evaluation of the vocal cords. No asymmetry of the cords. Correlate with laryngoscopy results.   No enlarged lymph nodes in the neck. 7 mm right level 2 lymph node may be reactive. No definite pathologic lymph nodes in the neck.   04/22/2019 Imaging   PET: IMPRESSION: 1. Mild residual activity in the posterior RIGHT hypopharynx confined to the mucosa. 2. No evidence of hypermetabolic metastatic lymph nodes in LEFT or RIGHT neck. 3. No evidence distant metastatic disease.   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[place])   05/02/2019 Cancer Staging   Staging form: Larynx - Supraglottis, AJCC 8th Edition - Clinical: Stage II (cT2, cN0, cM0) - Signed by [name], MD on 05/02/2019   Laryngeal cancer ([place])  01/06/2020 Initial Diagnosis   Laryngeal cancer ([place])   06/13/2021 -  Chemotherapy   Patient is on Treatment Plan :  Paclitaxel + Carboplatin q7d     06/13/2021 -  Chemotherapy   Patient is on Treatment Plan : HEAD/NECK Pembrolizumab Q21D     Cancer of tonsillar fossa ([place])  08/31/2020 Initial Diagnosis   Cancer of tonsillar fossa ([place])   09/25/2020 - 10/30/2020 Chemotherapy          03/05/2021 - 03/28/2021 Chemotherapy          06/13/2021 -  Chemotherapy   Patient is on Treatment Plan :  Paclitaxel + Carboplatin q7d       INTERIM HISTORY  Patient is here for FU . He tolerated cycle 1 of weekly CarboTaxol and Keytruda well.  He complains of some tongue swelling, sore throat as well as some earache.  Sore throat and earache are chronic complaints.  Tongue swelling is relatively new but he cannot exactly describe it.  His feeding is mostly through G-tube.  Weight is overall maintained, he may have lost a couple pounds. No change in breathing.  He had mild diarrhea bowel movements about 3-4 times a day and had to take Imodium.  No change in urinary habits.  No pain around the G-tube.  Rest of the pertinent 10 point ROS reviewed and negative.  MEDICAL HISTORY:  Past Medical History:  Diagnosis Date   Cancer ([place])    Throat cancer 2019   ETOH abuse    Frequent urination    Glaucoma    Hepatitis C virus infection without hepatic coma    dx'ed in 11/2018   History of radiation therapy 05/12/19- 07/06/19   Larynx   Hypertension    Wears denture    upper only; lost lower denture    SURGICAL HISTORY: Past Surgical History:  Procedure Laterality Date   ANKLE SURGERY  2011  right ankle   COLONOSCOPY  02/2019   polyps - Dr Havery Moros   DIRECT LARYNGOSCOPY N/A 04/01/2019   Procedure: DIRECT LARYNGOSCOPY WITH BIOPSY;  Surgeon: Jodi Marble, MD;  Location: [facility] OR;  Service: ENT;  Laterality: N/A;   DIRECT LARYNGOSCOPY N/A 01/06/2020   Procedure: DIRECT LARYNGOSCOPY;  Surgeon: Izora Gala, MD;  Location: [facility] OR;  Service: ENT;  Laterality: N/A;   DIRECT LARYNGOSCOPY N/A 08/13/2020   Procedure: DIRECT LARYNGOSCOPY;  Surgeon: Izora Gala, MD;  Location: [place];  Service: ENT;  Laterality: N/A;   ESOPHAGOSCOPY N/A 04/01/2019   Procedure: ESOPHAGOSCOPY;  Surgeon: Jodi Marble, MD;  Location: [place];  Service: ENT;  Laterality: N/A;   EXCISION ORAL TUMOR Right 08/13/2020   Procedure: BIOPSY OF OROPHARYNGEAL MASS;  Surgeon: Izora Gala, MD;  Location: [place];  Service:  ENT;  Laterality: Right;   EYE SURGERY Right    IR CM INJ ANY COLONIC TUBE W/FLUORO  12/20/2020   IR CM INJ ANY COLONIC TUBE W/FLUORO  03/20/2021   IR GASTROSTOMY TUBE MOD SED  09/24/2020   IR IMAGING GUIDED PORT INSERTION  09/24/2020   KNEE SURGERY     LAPAROTOMY N/A 04/12/2021   Procedure: EXPLORATORY LAPAROTOMY, REPAIR OF DUODENAL ULCER WITH Silvestre Gunner;  Surgeon: Felicie Morn, MD;  Location: [facility];  Service: General;  Laterality: N/A;   LARYNGOSCOPY AND BRONCHOSCOPY N/A 04/01/2019   Procedure: BRONCHOSCOPY;  Surgeon: Jodi Marble, MD;  Location: [place];  Service: ENT;  Laterality: N/A;   RADICAL NECK DISSECTION N/A 01/06/2020   Procedure: RADICAL NECK DISSECTION;  Surgeon: Izora Gala, MD;  Location: [facility] OR;  Service: ENT;  Laterality: N/A;    SOCIAL HISTORY: Social History   Socioeconomic History   Marital status: Single    Spouse name: Not on file   Number of children: 2   Years of education: Not on file   Highest education level: Not on file  Occupational History   Not on file  Tobacco Use   Smoking status: Former    Years: 50.00    Types: Cigarettes    Quit date: 06/01/2020    Years since quitting: 1.0   Smokeless tobacco: Never  Vaping Use   Vaping Use: Never used  Substance and Sexual Activity   Alcohol use: Yes    Alcohol/week: 4.0 standard drinks    Types: 4 Cans of beer per week    Comment: 40oz beer daily   Drug use: Yes    Types: Cocaine    Comment: none in 2 yrs   Sexual activity: Yes    Partners: Female  Other Topics Concern   Not on file  Social History Narrative   Patient is divorced with 2 children.   Patient is currently living with his sister.   Patient with a history of smoking a third of pack of cigarettes daily for 50 years.  Patient currently smoking 2 to 3 cigarettes/day.   Patient has never used smokeless tobacco.   Patient with occasional use of alcohol.   Patient last used cocaine approximately 6 months ago.  Patient denies use of  marijuana.   Social Determinants of Health   Financial Resource Strain: Not on file  Food Insecurity: No Food Insecurity   Worried About Charity fundraiser in the Last Year: Never true   Ran Out of Food in the Last Year: Never true  Transportation Needs: No Transportation Needs   Lack of Transportation (Medical): No  Lack of Transportation (Non-Medical): No  Physical Activity: Not on file  Stress: Not on file  Social Connections: Unknown   Frequency of Communication with Friends and Family: Three times a week   Frequency of Social Gatherings with Friends and Family: More than three times a week   Attends Religious Services: Not on file   Active Member of Clubs or Organizations: Not on file   Attends Archivist Meetings: Not on file   Marital Status: Not on file  Intimate Partner Violence: Not on file    FAMILY HISTORY: Family History  Problem Relation Age of Onset   Breast cancer Sister    Colon cancer Brother 79       ????   Cancer Brother     ALLERGIES:  has No Known Allergies.  MEDICATIONS:  Current Outpatient Medications  Medication Sig Dispense Refill   atropine 1 % ophthalmic solution Place 1 drop into the right eye 2 (two) times a day.      dexamethasone (DECADRON) 4 MG tablet Take 2 tablets (8 mg total) by mouth daily. Start the day after chemotherapy for 2 days. 30 tablet 1   guaiFENesin (ROBITUSSIN) 100 MG/5ML SOLN Place 10 mLs (200 mg total) into feeding tube 2 (two) times daily. 236 mL 0   lidocaine (XYLOCAINE) 2 % solution Use as directed 10 mLs in the mouth or throat every 6 (six) hours as needed for mouth pain. Mix 1 part of lidocaine with 1 part of water, swish and swallow 100 mL 2   lidocaine-prilocaine (EMLA) cream Apply to affected area once 30 g 3   Nutritional Supplements (K[employer] STANDARD 1.4) LIQD 487 mLs by Enteral route 3 (three) times daily. 1.5 cartons (487 ml) [employer] 1.4 TID with 60 ml free water before and 60 ml free water  after each TF bolus. Provide an additional 120 ml free water TID.     ondansetron (ZOFRAN) 8 MG tablet Take 1 tablet (8 mg total) by mouth 2 (two) times daily as needed for refractory nausea / vomiting. Start on day 3 after chemo. 30 tablet 1   oxyCODONE (OXY IR/ROXICODONE) 5 MG immediate release tablet Take 1 tablet (5 mg total) by mouth every 6 (six) hours as needed for severe pain. 120 tablet 0   pantoprazole sodium (PROTONIX) 40 mg/20 mL PACK Place 20 mLs (40 mg total) into feeding tube 2 (two) times daily. 3000 mL 1   prednisoLONE acetate (PRED FORTE) 1 % ophthalmic suspension Place 1 drop into the right eye 4 (four) times daily.     prochlorperazine (COMPAZINE) 10 MG tablet Take 1 tablet (10 mg total) by mouth every 6 (six) hours as needed (Nausea or vomiting). 30 tablet 1   Vitamin D, Ergocalciferol, (DRISDOL) 1.25 MG (50000 UNIT) CAPS capsule Take 50,000 Units by mouth every Saturday.     Water For Irrigation, Sterile (FREE WATER) SOLN Place 100 mLs into feeding tube every 4 (four) hours.     No current facility-administered medications for this visit.   Facility-Administered Medications Ordered in Other Visits  Medication Dose Route Frequency Provider Last Rate Last Admin   CARBOplatin (PARAPLATIN) 110 mg in sodium chloride 0.9 % 100 mL chemo infusion  110 mg Intravenous Once Naelani Lafrance, MD       heparin lock flush 100 unit/mL  500 Units Intracatheter Once PRN Kenston Longton, Arletha Pili, MD       influenza vaccine adjuvanted (FLUAD) injection 0.5 mL  0.5 mL Intramuscular Once Creedon Danielski,  MD       PACLitaxel (TAXOL) 78 mg in sodium chloride 0.9 % 250 mL chemo infusion (</= 56m/m2)  50 mg/m2 (Treatment Plan Recorded) Intravenous Once Bhavesh Vazquez, MD       sodium chloride flush (NS) 0.9 % injection 10 mL  10 mL Intracatheter PRN Golda Zavalza, MD        PHYSICAL EXAMINATION:   ECOG PERFORMANCE STATUS: 0 - Asymptomatic  Vitals:   06/20/21 1055  BP: 102/69  Pulse: (!) 102  Resp:  18  Temp: (!) 97.2 F (36.2 C)  SpO2: 100%    Filed Weights   06/20/21 1055  Weight: 104 lb 7 oz (47.4 kg)    Physical Exam Constitutional:      General: He is not in acute distress.    Comments: Frail   HENT:     Head: Normocephalic.     Mouth/Throat:     Mouth: Mucous membranes are moist.     Pharynx: No oropharyngeal exudate or posterior oropharyngeal erythema.  Neck:     Comments: Some asymmetrical swelling left upper neck Skin tightness from surgical and post radiation changes It is hard to examine his neck given underlying post surgical changes.  Cardiovascular:     Rate and Rhythm: Normal rate and regular rhythm.  Pulmonary:     Effort: Pulmonary effort is normal.  Abdominal:     General: Abdomen is flat.     Palpations: Abdomen is soft.     Comments: G tube appears ok, no tenderness around the G-tube.   Musculoskeletal:        General: No swelling or tenderness.     Cervical back: Normal range of motion and neck supple. No rigidity.  Skin:    General: Skin is warm and dry.  Neurological:     General: No focal deficit present.     Mental Status: He is alert.  Psychiatric:        Mood and Affect: Mood normal.   No change in exam from last visit.  LABORATORY DATA:  I have reviewed the data as listed Lab Results  Component Value Date   WBC 8.7 06/20/2021   HGB 10.5 (L) 06/20/2021   HCT 32.5 (L) 06/20/2021   MCV 91.3 06/20/2021   PLT 241 06/20/2021     Chemistry      Component Value Date/Time   NA 136 06/20/2021 1017   K 3.9 06/20/2021 1017   CL 101 06/20/2021 1017   CO2 27 06/20/2021 1017   BUN 13 06/20/2021 1017   CREATININE 0.75 06/20/2021 1017   CREATININE 0.61 (L) 10/26/2019 1523      Component Value Date/Time   CALCIUM 9.5 06/20/2021 1017   ALKPHOS 52 06/20/2021 1017   AST 9 (L) 06/20/2021 1017   ALT 6 06/20/2021 1017   ALT 53 (H) 09/06/2019 1543   BILITOT 0.5 06/20/2021 1017     Labs from today reviewed okay to  proceed.  RADIOGRAPHIC STUDIES: I have personally reviewed the radiological images as listed and agreed with the findings in the report. No results found.  PDL1 negative < 1%  All questions were answered. The patient knows to call the clinic with any problems, questions or concerns.  I spent 30 minutes in the care of this patient including H and P, review of records, counseling and coordination of care. We have reviewed current treatment plan, labs, adverse effects and follow-up recommendations.   PBenay Pike MD 06/20/2021 12:37 PM

## 2021-06-20 NOTE — Patient Instructions (Signed)
[facility] MEDICAL ONCOLOGY  Discharge Instructions: Thank you for choosing [facility] to provide your oncology and hematology care.   If you have a lab appointment with the Cancer Center, please go directly to the Cancer Center and check in at the registration area.   Wear comfortable clothing and clothing appropriate for easy access to any Portacath or PICC line.   We strive to give you quality time with your provider. You may need to reschedule your appointment if you arrive late (15 or more minutes).  Arriving late affects you and other patients whose appointments are after yours.  Also, if you miss three or more appointments without notifying the office, you may be dismissed from the clinic at the provider's discretion.      For prescription refill requests, have your pharmacy contact our office and allow 72 hours for refills to be completed.    Today you received the following chemotherapy and/or immunotherapy agents : Taxol, Carboplatin   To help prevent nausea and vomiting after your treatment, we encourage you to take your nausea medication as directed.  BELOW ARE SYMPTOMS THAT SHOULD BE REPORTED IMMEDIATELY: *FEVER GREATER THAN 100.4 F (38 C) OR HIGHER *CHILLS OR SWEATING *NAUSEA AND VOMITING THAT IS NOT CONTROLLED WITH YOUR NAUSEA MEDICATION *UNUSUAL SHORTNESS OF BREATH *UNUSUAL BRUISING OR BLEEDING *URINARY PROBLEMS (pain or burning when urinating, or frequent urination) *BOWEL PROBLEMS (unusual diarrhea, constipation, pain near the anus) TENDERNESS IN MOUTH AND THROAT WITH OR WITHOUT PRESENCE OF ULCERS (sore throat, sores in mouth, or a toothache) UNUSUAL RASH, SWELLING OR PAIN  UNUSUAL VAGINAL DISCHARGE OR ITCHING   Items with * indicate a potential emergency and should be followed up as soon as possible or go to the Emergency Department if any problems should occur.  Please show the CHEMOTHERAPY ALERT CARD or IMMUNOTHERAPY ALERT CARD at  check-in to the Emergency Department and triage nurse.  Should you have questions after your visit or need to cancel or reschedule your appointment, please contact [facility] MEDICAL [facility]COL[facility]  Dept: [phone]  and follow the prompts.  Office hours are 8:00 a.m. to 4:30 p.m. Monday - Friday. Please note that voicemails left after 4:00 p.m. may not be returned until the following business day.  We are closed weekends and major holidays. You have access to a nurse at all times for urgent questions. Please call the main number to the clinic Dept: [phone] and follow the prompts.   For any non-urgent questions, you may also contact your provider using MyChart. We now offer e-Visits for anyone 18 and older to request care online for non-urgent symptoms. For details visit [email or web address].   Also download the MyChart app! Go to the app store, search "MyChart", open the app, select [facility], and log in with your MyChart username and password.  Due to Covid, a mask is required upon entering the hospital/clinic. If you do not have a mask, one will be given to you upon arrival. For doctor visits, patients may have 1 support person aged 18 or older with them. For treatment visits, patients cannot have anyone with them due to current Covid guidelines and our immunocompromised population.   

## 2021-06-21 ENCOUNTER — Ambulatory Visit: Payer: Medicare HMO | Admitting: Radiation Oncology

## 2021-06-26 ENCOUNTER — Ambulatory Visit: Payer: Medicare HMO

## 2021-06-26 ENCOUNTER — Ambulatory Visit: Payer: Medicare HMO | Admitting: Hematology and Oncology

## 2021-06-26 ENCOUNTER — Inpatient Hospital Stay: Payer: Medicare HMO | Admitting: Dietician

## 2021-06-26 ENCOUNTER — Other Ambulatory Visit: Payer: Medicare HMO

## 2021-06-26 NOTE — Progress Notes (Signed)
Nutrition  Patient did not show for nutrition appointment. Will plan to see patient during  infusion on 10/13

## 2021-06-27 ENCOUNTER — Inpatient Hospital Stay: Payer: Medicare HMO

## 2021-06-27 ENCOUNTER — Other Ambulatory Visit: Payer: Self-pay | Admitting: Hematology and Oncology

## 2021-06-27 ENCOUNTER — Inpatient Hospital Stay: Payer: Medicare HMO | Admitting: Dietician

## 2021-06-27 ENCOUNTER — Other Ambulatory Visit: Payer: Self-pay

## 2021-06-27 ENCOUNTER — Inpatient Hospital Stay: Payer: Medicare HMO | Admitting: Hematology and Oncology

## 2021-06-27 VITALS — BP 106/68 | HR 88 | Temp 98.5°F | Resp 16 | Ht 68.0 in | Wt 108.9 lb

## 2021-06-27 DIAGNOSIS — Z5111 Encounter for antineoplastic chemotherapy: Secondary | ICD-10-CM | POA: Diagnosis not present

## 2021-06-27 DIAGNOSIS — C329 Malignant neoplasm of larynx, unspecified: Secondary | ICD-10-CM

## 2021-06-27 DIAGNOSIS — C090 Malignant neoplasm of tonsillar fossa: Secondary | ICD-10-CM

## 2021-06-27 DIAGNOSIS — Z95828 Presence of other vascular implants and grafts: Secondary | ICD-10-CM

## 2021-06-27 LAB — CMP (CANCER CENTER ONLY)
ALT: 11 U/L (ref 0–44)
AST: 12 U/L — ABNORMAL LOW (ref 15–41)
Albumin: 3.8 g/dL (ref 3.5–5.0)
Alkaline Phosphatase: 45 U/L (ref 38–126)
Anion gap: 7 (ref 5–15)
BUN: 15 mg/dL (ref 8–23)
CO2: 31 mmol/L (ref 22–32)
Calcium: 9.5 mg/dL (ref 8.9–10.3)
Chloride: 97 mmol/L — ABNORMAL LOW (ref 98–111)
Creatinine: 0.51 mg/dL — ABNORMAL LOW (ref 0.61–1.24)
GFR, Estimated: >60 mL/min (ref >60)
Glucose, Bld: 108 mg/dL — ABNORMAL HIGH (ref 70–99)
Potassium: 4.1 mmol/L (ref 3.5–5.1)
Sodium: 135 mmol/L (ref 135–145)
Total Bilirubin: 0.4 mg/dL (ref 0.3–1.2)
Total Protein: 7.4 g/dL (ref 6.5–8.1)

## 2021-06-27 LAB — CBC WITH DIFFERENTIAL (CANCER CENTER ONLY)
Abs Immature Granulocytes: 0.05 10*3/uL (ref 0.00–0.07)
Basophils Absolute: 0 10*3/uL (ref 0.0–0.1)
Basophils Relative: 1 %
Eosinophils Absolute: 0.1 10*3/uL (ref 0.0–0.5)
Eosinophils Relative: 1 %
HCT: 31.4 % — ABNORMAL LOW (ref 39.0–52.0)
Hemoglobin: 10.1 g/dL — ABNORMAL LOW (ref 13.0–17.0)
Immature Granulocytes: 1 %
Lymphocytes Relative: 23 %
Lymphs Abs: 1 10*3/uL (ref 0.7–4.0)
MCH: 29.2 pg (ref 26.0–34.0)
MCHC: 32.2 g/dL (ref 30.0–36.0)
MCV: 90.8 fL (ref 80.0–100.0)
Monocytes Absolute: 0.4 10*3/uL (ref 0.1–1.0)
Monocytes Relative: 10 %
Neutro Abs: 2.8 10*3/uL (ref 1.7–7.7)
Neutrophils Relative %: 64 %
Platelet Count: 303 10*3/uL (ref 150–400)
RBC: 3.46 MIL/uL — ABNORMAL LOW (ref 4.22–5.81)
RDW: 13.9 % (ref 11.5–15.5)
WBC Count: 4.3 10*3/uL (ref 4.0–10.5)
nRBC: 0 % (ref 0.0–0.2)

## 2021-06-27 MED ORDER — SODIUM CHLORIDE 0.9 % IV SOLN
10.0000 mg | Freq: Once | INTRAVENOUS | Status: AC
  Administered 2021-06-27: 10 mg via INTRAVENOUS
  Filled 2021-06-27: qty 10

## 2021-06-27 MED ORDER — PALONOSETRON HCL INJECTION 0.25 MG/5ML
0.2500 mg | Freq: Once | INTRAVENOUS | Status: AC
Start: 2021-06-27 — End: 2021-06-27
  Administered 2021-06-27: 0.25 mg via INTRAVENOUS
  Filled 2021-06-27: qty 5

## 2021-06-27 MED ORDER — SODIUM CHLORIDE 0.9 % IV SOLN: Freq: Once | INTRAVENOUS | Status: AC

## 2021-06-27 MED ORDER — DIPHENHYDRAMINE HCL 50 MG/ML IJ SOLN
50.0000 mg | Freq: Once | INTRAMUSCULAR | Status: AC
  Administered 2021-06-27: 50 mg via INTRAVENOUS
  Filled 2021-06-27: qty 1

## 2021-06-27 MED ORDER — SODIUM CHLORIDE 0.9 % IV SOLN
112.0500 mg | Freq: Once | INTRAVENOUS | Status: AC
  Administered 2021-06-27: 110 mg via INTRAVENOUS
  Filled 2021-06-27: qty 11

## 2021-06-27 MED ORDER — SODIUM CHLORIDE 0.9% FLUSH
10.0000 mL | Freq: Once | INTRAVENOUS | Status: AC
Start: 2021-06-27 — End: 2021-06-27
  Administered 2021-06-27: 10 mL

## 2021-06-27 MED ORDER — SODIUM CHLORIDE 0.9 % IV SOLN
50.0000 mg/m2 | Freq: Once | INTRAVENOUS | Status: AC
  Administered 2021-06-27: 78 mg via INTRAVENOUS
  Filled 2021-06-27: qty 13

## 2021-06-27 MED ORDER — SODIUM CHLORIDE 0.9% FLUSH
10.0000 mL | INTRAVENOUS | Status: DC | PRN
  Administered 2021-06-27: 10 mL

## 2021-06-27 MED ORDER — HEPARIN SOD (PORK) LOCK FLUSH 100 UNIT/ML IV SOLN
500.0000 [IU] | Freq: Once | INTRAVENOUS | Status: AC | PRN
  Administered 2021-06-27: 500 [IU]

## 2021-06-27 MED ORDER — FAMOTIDINE 20 MG IN NS 100 ML IVPB
20.0000 mg | Freq: Once | INTRAVENOUS | Status: AC
  Administered 2021-06-27: 20 mg via INTRAVENOUS
  Filled 2021-06-27: qty 100

## 2021-06-27 NOTE — Patient Instructions (Signed)
[facility]  Discharge Instructions: Thank you for choosing [facility] to provide your oncology and hematology care.   If you have a lab appointment with the Cancer Center, please go directly to the Cancer Center and check in at the registration area.   Wear comfortable clothing and clothing appropriate for easy access to any Portacath or PICC line.   We strive to give you quality time with your provider. You may need to reschedule your appointment if you arrive late (15 or more minutes).  Arriving late affects you and other patients whose appointments are after yours.  Also, if you miss three or more appointments without notifying the office, you may be dismissed from the clinic at the provider's discretion.      For prescription refill requests, have your pharmacy contact our office and allow 72 hours for refills to be completed.    Today you received the following chemotherapy and/or immunotherapy agents : Taxol, Carboplatin   To help prevent nausea and vomiting after your treatment, we encourage you to take your nausea medication as directed.  BELOW ARE SYMPTOMS THAT SHOULD BE REPORTED IMMEDIATELY: *FEVER GREATER THAN 100.4 F (38 C) OR HIGHER *CHILLS OR SWEATING *NAUSEA AND VOMITING THAT IS NOT CONTROLLED WITH YOUR NAUSEA MEDICATION *UNUSUAL SHORTNESS OF BREATH *UNUSUAL BRUISING OR BLEEDING *URINARY PROBLEMS (pain or burning when urinating, or frequent urination) *BOWEL PROBLEMS (unusual diarrhea, constipation, pain near the anus) TENDERNESS IN MOUTH AND THROAT WITH OR WITHOUT PRESENCE OF ULCERS (sore throat, sores in mouth, or a toothache) UNUSUAL RASH, SWELLING OR PAIN  UNUSUAL VAGINAL DISCHARGE OR ITCHING   Items with * indicate a potential emergency and should be followed up as soon as possible or go to the Emergency Department if any problems should occur.  Please show the CHEMOTHERAPY ALERT CARD or IMMUNOTHERAPY ALERT CARD at  check-in to the Emergency Department and triage nurse.  Should you have questions after your visit or need to cancel or reschedule your appointment, please contact [facility]  Dept: [phone]  and follow the prompts.  Office hours are 8:00 a.m. to 4:30 p.m. Monday - Friday. Please note that voicemails left after 4:00 p.m. may not be returned until the following business day.  We are closed weekends and major holidays. You have access to a nurse at all times for urgent questions. Please call the main number to the clinic Dept: [phone] and follow the prompts.   For any non-urgent questions, you may also contact your provider using MyChart. We now offer e-Visits for anyone 18 and older to request care online for non-urgent symptoms. For details visit [email or web address].   Also download the MyChart app! Go to the app store, search "MyChart", open the app, select [facility], and log in with your MyChart username and password.  Due to Covid, a mask is required upon entering the hospital/clinic. If you do not have a mask, one will be given to you upon arrival. For doctor visits, patients may have 1 support person aged 18 or older with them. For treatment visits, patients cannot have anyone with them due to current Covid guidelines and our immunocompromised population.   

## 2021-06-27 NOTE — Progress Notes (Signed)
Nutrition Follow-up:  Patient completed concurrent chemoradiation therapy for laryngeal cancer. Now with progression. He is currently receiving CarboTaxol and Keytruda.   Met with patient in waiting area prior to treatment. Patient reports he is unsure if he will be receiving treatment today and agreeable to completed nutrition follow-up while waiting. He reports tolerating treatment well, reports one episode of nausea with vomiting after taking morning medication with pineapple juice. Patient denies diarrhea, reports 3-4 small soft bowel movements daily. He is tolerating tube feedings, reports giving 3-4 cartons of [employer] 1.4/day. Patient recently received shipment of formula and supplies, reports he has plenty.    Medications: Oxycodone, Compazine, Zofran  Labs: 10/6 - Glucose 132  Anthropometrics: Weight 108 lb 14.4 oz today increased from 104 lb 7 oz on 10/6 decreased from 108 lb 4 oz on 9/29    NUTRITION DIAGNOSIS: Unintentional weight loss stable   MALNUTRITION DIAGNOSIS: Severe malnutrition ongoing   INTERVENTION: Educated patient to give 4 cartons [employer] 1.4 via tube QID. Encouraged to increase to 5 cartons as tolerated to promote weight gain.  -4 cartons [employer] 1.4 with 120 ml water flush QID provides 1820 kcal, 80 grams protein, 1790 ml total water -5 cartons [employer] 1.4 with 120 ml water flush QID provides 2275 kcal, 100 grams protein, 2118 ml total water.  Patient has contact information  MONITORING, EVALUATION, GOAL: weight trends, tube feeding   NEXT VISIT: To be scheduled with treatment

## 2021-07-01 ENCOUNTER — Other Ambulatory Visit (HOSPITAL_COMMUNITY): Payer: Self-pay | Admitting: Interventional Radiology

## 2021-07-01 DIAGNOSIS — Z431 Encounter for attention to gastrostomy: Secondary | ICD-10-CM

## 2021-07-02 ENCOUNTER — Other Ambulatory Visit: Payer: Self-pay

## 2021-07-02 ENCOUNTER — Ambulatory Visit (HOSPITAL_COMMUNITY)
Admission: RE | Admit: 2021-07-02 | Discharge: 2021-07-02 | Disposition: A | Discharge status: Home / Self Care | Payer: Medicare HMO | Source: Ambulatory Visit | Attending: Interventional Radiology | Admitting: Interventional Radiology

## 2021-07-02 ENCOUNTER — Other Ambulatory Visit (HOSPITAL_COMMUNITY): Payer: Self-pay | Admitting: Interventional Radiology

## 2021-07-02 DIAGNOSIS — Z431 Encounter for attention to gastrostomy: Secondary | ICD-10-CM

## 2021-07-02 HISTORY — PX: IR RADIOLOGIST EVAL & MGMT: IMG5224

## 2021-07-02 MED ORDER — LIDOCAINE HCL 1 % IJ SOLN
INTRAMUSCULAR | Status: AC
  Filled 2021-07-02: qty 20

## 2021-07-02 MED ORDER — LIDOCAINE VISCOUS HCL 2 % MT SOLN
OROMUCOSAL | Status: AC
  Filled 2021-07-02: qty 15

## 2021-07-02 MED ORDER — SILVER NITRATE-POT NITRATE 75-25 % EX MISC
CUTANEOUS | Status: AC
  Filled 2021-07-02: qty 10

## 2021-07-02 MED ORDER — SILVER NITRATE-POT NITRATE 75-25 % EX MISC
1.0000 | Freq: Once | CUTANEOUS | Status: DC
  Filled 2021-07-02: qty 1

## 2021-07-04 ENCOUNTER — Other Ambulatory Visit: Payer: Self-pay

## 2021-07-04 ENCOUNTER — Encounter: Payer: Self-pay | Admitting: Hematology and Oncology

## 2021-07-04 ENCOUNTER — Inpatient Hospital Stay: Payer: Medicare HMO

## 2021-07-04 ENCOUNTER — Inpatient Hospital Stay (HOSPITAL_BASED_OUTPATIENT_CLINIC_OR_DEPARTMENT_OTHER): Payer: Medicare HMO | Admitting: Hematology and Oncology

## 2021-07-04 VITALS — BP 108/86 | HR 100 | Temp 98.6°F | Resp 16 | Ht 68.0 in | Wt 108.7 lb

## 2021-07-04 DIAGNOSIS — C329 Malignant neoplasm of larynx, unspecified: Secondary | ICD-10-CM | POA: Diagnosis not present

## 2021-07-04 DIAGNOSIS — C090 Malignant neoplasm of tonsillar fossa: Secondary | ICD-10-CM

## 2021-07-04 DIAGNOSIS — C321 Malignant neoplasm of supraglottis: Secondary | ICD-10-CM

## 2021-07-04 DIAGNOSIS — T451X5A Adverse effect of antineoplastic and immunosuppressive drugs, initial encounter: Secondary | ICD-10-CM

## 2021-07-04 DIAGNOSIS — R634 Abnormal weight loss: Secondary | ICD-10-CM | POA: Diagnosis not present

## 2021-07-04 DIAGNOSIS — Z5111 Encounter for antineoplastic chemotherapy: Secondary | ICD-10-CM | POA: Diagnosis not present

## 2021-07-04 DIAGNOSIS — G620 Drug-induced polyneuropathy: Secondary | ICD-10-CM

## 2021-07-04 DIAGNOSIS — G893 Neoplasm related pain (acute) (chronic): Secondary | ICD-10-CM

## 2021-07-04 DIAGNOSIS — Z95828 Presence of other vascular implants and grafts: Secondary | ICD-10-CM

## 2021-07-04 LAB — CBC WITH DIFFERENTIAL (CANCER CENTER ONLY)
Abs Immature Granulocytes: 0.03 10*3/uL (ref 0.00–0.07)
Basophils Absolute: 0 10*3/uL (ref 0.0–0.1)
Basophils Relative: 1 %
Eosinophils Absolute: 0 10*3/uL (ref 0.0–0.5)
Eosinophils Relative: 1 %
HCT: 32 % — ABNORMAL LOW (ref 39.0–52.0)
Hemoglobin: 10.4 g/dL — ABNORMAL LOW (ref 13.0–17.0)
Immature Granulocytes: 1 %
Lymphocytes Relative: 20 %
Lymphs Abs: 0.9 10*3/uL (ref 0.7–4.0)
MCH: 29.7 pg (ref 26.0–34.0)
MCHC: 32.5 g/dL (ref 30.0–36.0)
MCV: 91.4 fL (ref 80.0–100.0)
Monocytes Absolute: 0.4 10*3/uL (ref 0.1–1.0)
Monocytes Relative: 8 %
Neutro Abs: 3.1 10*3/uL (ref 1.7–7.7)
Neutrophils Relative %: 69 %
Platelet Count: 305 10*3/uL (ref 150–400)
RBC: 3.5 MIL/uL — ABNORMAL LOW (ref 4.22–5.81)
RDW: 14.5 % (ref 11.5–15.5)
WBC Count: 4.4 10*3/uL (ref 4.0–10.5)
nRBC: 0 % (ref 0.0–0.2)

## 2021-07-04 LAB — CMP (CANCER CENTER ONLY)
ALT: 7 U/L (ref 0–44)
AST: 10 U/L — ABNORMAL LOW (ref 15–41)
Albumin: 3.6 g/dL (ref 3.5–5.0)
Alkaline Phosphatase: 50 U/L (ref 38–126)
Anion gap: 9 (ref 5–15)
BUN: 13 mg/dL (ref 8–23)
CO2: 27 mmol/L (ref 22–32)
Calcium: 9.6 mg/dL (ref 8.9–10.3)
Chloride: 101 mmol/L (ref 98–111)
Creatinine: 0.59 mg/dL — ABNORMAL LOW (ref 0.61–1.24)
GFR, Estimated: >60 mL/min (ref >60)
Glucose, Bld: 98 mg/dL (ref 70–99)
Potassium: 4.1 mmol/L (ref 3.5–5.1)
Sodium: 137 mmol/L (ref 135–145)
Total Bilirubin: 0.2 mg/dL — ABNORMAL LOW (ref 0.3–1.2)
Total Protein: 7.4 g/dL (ref 6.5–8.1)

## 2021-07-04 LAB — TSH: TSH: 1.113 u[IU]/mL (ref 0.320–4.118)

## 2021-07-04 MED ORDER — HEPARIN SOD (PORK) LOCK FLUSH 100 UNIT/ML IV SOLN
500.0000 [IU] | Freq: Once | INTRAVENOUS | Status: AC | PRN
  Administered 2021-07-04: 500 [IU]

## 2021-07-04 MED ORDER — DIPHENHYDRAMINE HCL 50 MG/ML IJ SOLN
50.0000 mg | Freq: Once | INTRAMUSCULAR | Status: AC
  Administered 2021-07-04: 50 mg via INTRAVENOUS
  Filled 2021-07-04: qty 1

## 2021-07-04 MED ORDER — SODIUM CHLORIDE 0.9 % IV SOLN
10.0000 mg | Freq: Once | INTRAVENOUS | Status: AC
  Administered 2021-07-04: 10 mg via INTRAVENOUS
  Filled 2021-07-04: qty 10

## 2021-07-04 MED ORDER — SODIUM CHLORIDE 0.9% FLUSH
10.0000 mL | INTRAVENOUS | Status: DC | PRN
  Administered 2021-07-04: 10 mL

## 2021-07-04 MED ORDER — FAMOTIDINE 20 MG IN NS 100 ML IVPB
20.0000 mg | Freq: Once | INTRAVENOUS | Status: AC
  Administered 2021-07-04: 20 mg via INTRAVENOUS
  Filled 2021-07-04: qty 100

## 2021-07-04 MED ORDER — SODIUM CHLORIDE 0.9 % IV SOLN
50.0000 mg/m2 | Freq: Once | INTRAVENOUS | Status: AC
  Administered 2021-07-04: 78 mg via INTRAVENOUS
  Filled 2021-07-04: qty 13

## 2021-07-04 MED ORDER — SODIUM CHLORIDE 0.9 % IV SOLN
200.0000 mg | Freq: Once | INTRAVENOUS | Status: AC
  Administered 2021-07-04: 200 mg via INTRAVENOUS
  Filled 2021-07-04: qty 8

## 2021-07-04 MED ORDER — SODIUM CHLORIDE 0.9% FLUSH
10.0000 mL | Freq: Once | INTRAVENOUS | Status: AC
  Administered 2021-07-04: 10 mL

## 2021-07-04 MED ORDER — PALONOSETRON HCL INJECTION 0.25 MG/5ML
0.2500 mg | Freq: Once | INTRAVENOUS | Status: AC
  Administered 2021-07-04: 0.25 mg via INTRAVENOUS
  Filled 2021-07-04: qty 5

## 2021-07-04 MED ORDER — SODIUM CHLORIDE 0.9 % IV SOLN
112.0500 mg | Freq: Once | INTRAVENOUS | Status: AC
  Administered 2021-07-04: 110 mg via INTRAVENOUS
  Filled 2021-07-04: qty 11

## 2021-07-04 MED ORDER — SODIUM CHLORIDE 0.9 % IV SOLN: Freq: Once | INTRAVENOUS | Status: AC

## 2021-07-04 NOTE — Patient Instructions (Signed)
[facility]   Discharge Instructions: Thank you for choosing [facility] to provide your oncology and hematology care.   If you have a lab appointment with the Cancer Center, please go directly to the Cancer Center and check in at the registration area.   Wear comfortable clothing and clothing appropriate for easy access to any Portacath or PICC line.   We strive to give you quality time with your provider. You may need to reschedule your appointment if you arrive late (15 or more minutes).  Arriving late affects you and other patients whose appointments are after yours.  Also, if you miss three or more appointments without notifying the office, you may be dismissed from the clinic at the provider's discretion.      For prescription refill requests, have your pharmacy contact our office and allow 72 hours for refills to be completed.    Today you received the following chemotherapy and/or immunotherapy agents keytruda, paclitaxel, carboplatin      To help prevent nausea and vomiting after your treatment, we encourage you to take your nausea medication as directed.  BELOW ARE SYMPTOMS THAT SHOULD BE REPORTED IMMEDIATELY: *FEVER GREATER THAN 100.4 F (38 C) OR HIGHER *CHILLS OR SWEATING *NAUSEA AND VOMITING THAT IS NOT CONTROLLED WITH YOUR NAUSEA MEDICATION *UNUSUAL SHORTNESS OF BREATH *UNUSUAL BRUISING OR BLEEDING *URINARY PROBLEMS (pain or burning when urinating, or frequent urination) *BOWEL PROBLEMS (unusual diarrhea, constipation, pain near the anus) TENDERNESS IN MOUTH AND THROAT WITH OR WITHOUT PRESENCE OF ULCERS (sore throat, sores in mouth, or a toothache) UNUSUAL RASH, SWELLING OR PAIN  UNUSUAL VAGINAL DISCHARGE OR ITCHING   Items with * indicate a potential emergency and should be followed up as soon as possible or go to the Emergency Department if any problems should occur.  Please show the CHEMOTHERAPY ALERT CARD or IMMUNOTHERAPY  ALERT CARD at check-in to the Emergency Department and triage nurse.  Should you have questions after your visit or need to cancel or reschedule your appointment, please contact [facility]  Dept: [phone]  and follow the prompts.  Office hours are 8:00 a.m. to 4:30 p.m. Monday - Friday. Please note that voicemails left after 4:00 p.m. may not be returned until the following business day.  We are closed weekends and major holidays. You have access to a nurse at all times for urgent questions. Please call the main number to the clinic Dept: [phone] and follow the prompts.   For any non-urgent questions, you may also contact your provider using MyChart. We now offer e-Visits for anyone 18 and older to request care online for non-urgent symptoms. For details visit [email or web address].   Also download the MyChart app! Go to the app store, search "MyChart", open the app, select [facility], and log in with your MyChart username and password.  Due to Covid, a mask is required upon entering the hospital/clinic. If you do not have a mask, one will be given to you upon arrival. For doctor visits, patients may have 1 support person aged 18 or older with them. For treatment visits, patients cannot have anyone with them due to current Covid guidelines and our immunocompromised population.   

## 2021-07-04 NOTE — Progress Notes (Signed)
[name] FOLLOW UP NOTE  Patient Care Team: Alan Ebbs, MD as PCP - General (Internal Medicine) Alan Gala, MD as Consulting Physician (Otolaryngology) Alan Gibson, MD as Attending Physician (Radiation Oncology) Alan Sauers, RN (Inactive) as Oncology Nurse Navigator [name], RD as Dietitian (Nutrition) [name], CCC-SLP as Speech Language Pathologist (Speech Pathology) [facility], LCSW as Social Worker Alan Pike, MD as Consulting Physician (Hematology and Oncology) Malmfelt, Stephani Police, RN as Oncology Nurse Navigator  CHIEF COMPLAINTS/PURPOSE OF CONSULTATION:  Follow up on carbo/taxolpembro  ASSESSMENT & PLAN:   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 He had end of treatment PET scan which showed bilateral lymphadenopathy which is concerning for residual disease. He is unfortunately not a candidate for any more radiation and surgery. He was recommended palliative chemotherapy or immunotherapy His performance status was also borderline and he was very frail when he presented hence we have agreed to try single agent chemotherapy. We agreed to try single agent docetaxel at 75% of dose. He had 2 cycles of chemotherapy and was recently admitted for perforated duodenal ulcer.  He had recovered well from the perforated duodenal ulcer however his recent imaging is consistent with progression in both the primary lesion as well as the lymphadenopathy.  During his last visit, we have discussed about progression on docetaxel and recommended considering chemoimmunotherapy.  He also saw [name] for second opinion at [name] and he wanted to proceed with treatment here.  I had a discussion with [name] who suggested trying weekly CarboTaxol given concern for tolerance as  well as every 21-day Keytruda, reassess response after 2 cycles. We have discussed common side effects from immunotherapy including fatigue, dry skin, hypo-/hyperthyroidism, diarrhea, hepatitis etc.  We have also discussed that less than 5% of the side effects from immunotherapy can be life-threatening.  He is willing to take risks and try this regimen.  He understands that if this regimen does not help him with his current disease, he may have to consider hospice.  He started C1D1 on 06/13/2021 He has been doing well so far. ROS consistent with fatigue and grade 1 neuropathy PE unremarkable, no concerns. CBC and CMP reviewed, ok to proceed with chemo and immunotherapy as planned. Repeat imaging nov 7 th week to assess response and discuss treatment recommendations accordingly. He will FU with [name].  Cancer related pain We have discussed about trying pain medication every 6 hours as needed for pain management. No change in pain medicine needs Will continue to monitor.  Weight loss, unintentional Stable since last visit Continues on G tube feeds.   Neuropathy due to chemotherapeutic drug (HCC) Grade 1 and mild.  We will monitor for now. He was advised to let us know if it continues to worsen. He expressed understanding  Orders Placed This Encounter  Procedures   CT Soft Tissue Neck W Contrast    Standing Status:   Future    Standing Expiration Date:   07/04/2022    Order Specific Question:   If indicated for the ordered procedure, I authorize the administration of contrast media per Radiology protocol    Answer:   Yes    Order Specific Question:   Preferred imaging location?    Answer:   [facility]   CT Chest W Contrast  Standing Status:   Future    Standing Expiration Date:   07/04/2022    Order Specific Question:   If indicated for the ordered procedure, I authorize the administration of contrast media per Radiology protocol    Answer:   Yes    Order Specific  Question:   Preferred imaging location?    Answer:   [facility]      HISTORY OF PRESENTING ILLNESS:   [name] 70 y.o. male is here for follow up after [facility] discharge.  Oncology History Overview Note    [name] is a 70 y.o. male who has been treated for laryngeal cancer in the past.   Aug 2020, Pt had presented with dysphagia and difficulty breathing through his mouth, 20 lb weight loss. He saw [facility]. [facility] who performed laryngoscopy and appreciated a 2 to 3 cm ovoid pedunculated mass arising from the right aryepiglottic fold and impinging on the supraglottic airway.  The vocal cords appeared to be mobile.  Urgent panendoscopy and biopsy were recommended to protect his airway.   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 According to his op note, a bulky necrotic and semi-pedunculated tumor coming off the lateral surface of the right aryepiglottic fold and the anterior and lateral aspect of the piriform sinus on the right side.  Airway was compromised by the tumor mass at intubation.   CT scan of the neck on 04/11/2019 showed glottic closure with no asymmetry of the cords, correlate with laryngoscopy results.  Questionable right level 2 lymph node measured 7 mm.  No definite pathologic lymph nodes in the neck.   PET scan on 04/22/2019 revealed mild residual activity in the posterior right hypopharynx confined to the mucosa with no evidence of metastatic disease to the neck or distantly.  He underwent definitive radiotherapy to T2N0 laryngeal cancer  since he refused surgical treatment.  He then had PET imaging which showed a right neck lymph node, suspicious on PET. Biopsy of right neck lymph node is positive for recurrence. He underwent right modified radical neck dissection including levels 2 and 3, including the internal jugular vein, suture marks the inferior jugular vein  stump.  He most recently underwent a soft tissue neck CT scan on the date of 07/05/2020 that revealed asymmetric soft tissue thickening and enhancement along the right lateral pharynx. It also showed suspicious small 6 mm hyperenhancing nodular soft tissue along the posterior margin of the neck dissect at right level 3. Additionally, there was a mild inflammatory appearing right upper lobe centrilobular ground-glass opacity that was new (consider mild or developing upper lobe infection), post radiation changes to the lung apices, and sequelae of radiation and right neck dissection from previous neck CT.   Subsequently, the patient underwent a direct laryngoscopy and biopsy of oropharyngeal mass on the date of 08/13/2020. Pathology from the procedure revealed poorly differentiated squamous cell carcinoma.  PET showed recurrence of head and neck cancer with broad hypermetabolic pharyngeal mucosal lesion involving right lateral posterior oropharynx and hypopharynx extending from palatine tonsil to the vallecula.Hypermetabolic lymph node posterior sternocleidomastoid muscle on the RIGHT (level 3).Hypermetabolic nodule within the LEFT parotid glands favored small primary parotid neoplasm.  Recommendation was to consider proceeding with upfront concurrent chemo radiation followed by salvage surgery if needed. He completed 6 weekly cycles of chemotherapy and concurrent radiation, 10/2020.  PET scan 01/31/2021 with worsening nodal disease in the left neck and a right  supraclavicular LN as discussed. Post treatment changes with soft tissue fullness in the area of the prior tumor but with markedly diminished metabolic activity.   He was started on single agent docetaxel given progressive tumor.  He was not thought to be a good candidate for any further radiation or surgery. [facility] to 04/22/2021 with perforated duodenal ulcer.  His cycle 3 chemotherapy was delayed for the  above-mentioned reason.  CT imaging from August 25 showed progression of lesion in the right tonsil with central fluid collection and peripheral enhancement.  This was a site of prior recurrence, could represent a progressive tumor or abscess.  There is also progressive malignant lymphadenopathy noted in the left neck.  Stable lymphadenopathy in the right supraclavicular region.  There is new area of stranding in the superior segment of the left lower lobe which is of uncertain etiology.   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 SEE NOTE   04/11/2019 Imaging   CT neck: IMPRESSION: The glottis is closed with suboptimal evaluation of the vocal cords. No asymmetry of the cords. Correlate with laryngoscopy results.   No enlarged lymph nodes in the neck. 7 mm right level 2 lymph node may be reactive. No definite pathologic lymph nodes in the neck.   04/22/2019 Imaging   PET: IMPRESSION: 1. Mild residual activity in the posterior RIGHT hypopharynx confined to the mucosa. 2. No evidence of hypermetabolic metastatic lymph nodes in LEFT or RIGHT neck. 3. No evidence distant metastatic disease.   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[place])   05/02/2019 Cancer Staging   Staging form: Larynx - Supraglottis, AJCC 8th Edition - Clinical: Stage II (cT2, cN0, cM0) - Signed by Alan Gibson, MD on 05/02/2019   Laryngeal cancer ([place])  01/06/2020 Initial Diagnosis   Laryngeal cancer ([place])   06/13/2021 -  Chemotherapy   Patient is on Treatment Plan :  Paclitaxel +  Carboplatin q7d     06/13/2021 -  Chemotherapy   Patient is on Treatment Plan : HEAD/NECK Pembrolizumab Q21D     Cancer of tonsillar fossa ([place])  08/31/2020 Initial Diagnosis   Cancer of tonsillar fossa ([place])   09/25/2020 - 10/30/2020 Chemotherapy          03/05/2021 - 03/28/2021 Chemotherapy          06/13/2021 -  Chemotherapy   Patient is on Treatment Plan :  Paclitaxel + Carboplatin q7d      INTERIM HISTORY  Patient is here for FU . He tolerated cycle 1 of keytruda and first three cycles of carbotaxol well. He is doing ok overall, complains of some tingling and numbness in his hands and feet. No interference with his activities of daily living No change in breathing, bowel habits or urinary habits Pain is well controlled.  Rest of the pertinent 10 point ROS reviewed and negative.  MEDICAL HISTORY:  Past Medical History:  Diagnosis Date   Cancer ([place])    Throat cancer 2019   ETOH abuse    Frequent urination    Glaucoma    Hepatitis C virus infection  without hepatic coma    dx'ed in 11/2018   History of radiation therapy 05/12/19- 07/06/19   Larynx   Hypertension    Wears denture    upper only; lost lower denture    SURGICAL HISTORY: Past Surgical History:  Procedure Laterality Date   ANKLE SURGERY  2011   right ankle   COLONOSCOPY  02/2019   polyps - [facility] Moros   DIRECT LARYNGOSCOPY N/A 04/01/2019   Procedure: DIRECT LARYNGOSCOPY WITH BIOPSY;  Surgeon: Jodi Marble, MD;  Location: [facility] OR;  Service: ENT;  Laterality: N/A;   DIRECT LARYNGOSCOPY N/A 01/06/2020   Procedure: DIRECT LARYNGOSCOPY;  Surgeon: Alan Gala, MD;  Location[facility];  Service: ENT;  Laterality: N/A;   DIRECT LARYNGOSCOPY N/A 08/13/2020   Procedure: DIRECT LARYNGOSCOPY;  Surgeon: Alan Gala, MD;  Location: [facility];  Service: ENT;  Laterality: N/A;   ESOPHAGOSCOPY N/A 04/01/2019   Procedure: ESOPHAGOSCOPY;  Surgeon: Jodi Marble, MD;  Location: [facility];  Service: ENT;   Laterality: N/A;   EXCISION ORAL TUMOR Right 08/13/2020   Procedure: BIOPSY OF OROPHARYNGEAL MASS;  Surgeon: Alan Gala, MD;  Location: [facility];  Service: ENT;  Laterality: Right;   EYE SURGERY Right    IR CM INJ ANY COLONIC TUBE W/FLUORO  12/20/2020   IR CM INJ ANY COLONIC TUBE W/FLUORO  03/20/2021   IR GASTROSTOMY TUBE MOD SED  09/24/2020   IR IMAGING GUIDED PORT INSERTION  09/24/2020   IR RADIOLOGIST EVAL & MGMT  07/02/2021   KNEE SURGERY     LAPAROTOMY N/A 04/12/2021   Procedure: EXPLORATORY LAPAROTOMY, REPAIR OF DUODENAL ULCER WITH Silvestre Gunner;  Surgeon: Felicie Morn, MD;  Location: [facility];  Service: General;  Laterality: N/A;   LARYNGOSCOPY AND BRONCHOSCOPY N/A 04/01/2019   Procedure: BRONCHOSCOPY;  Surgeon: Jodi Marble, MD;  Location: [facility];  Service: ENT;  Laterality: N/A;   RADICAL NECK DISSECTION N/A 01/06/2020   Procedure: RADICAL NECK DISSECTION;  Surgeon: Alan Gala, MD;  Location: [facility];  Service: ENT;  Laterality: N/A;    SOCIAL HISTORY: Social History   Socioeconomic History   Marital status: Single    Spouse name: Not on file   Number of children: 2   Years of education: Not on file   Highest education level: Not on file  Occupational History   Not on file  Tobacco Use   Smoking status: Former    Years: 50.00    Types: Cigarettes    Quit date: 06/01/2020    Years since quitting: 1.0   Smokeless tobacco: Never  Vaping Use   Vaping Use: Never used  Substance and Sexual Activity   Alcohol use: Yes    Alcohol/week: 4.0 standard drinks    Types: 4 Cans of beer per week    Comment: 40oz beer daily   Drug use: Yes    Types: Cocaine    Comment: none in 2 yrs   Sexual activity: Yes    Partners: Female  Other Topics Concern   Not on file  Social History Narrative   Patient is divorced with 2 children.   Patient is currently living with his sister.   Patient with a history of smoking a third of pack of cigarettes daily for 50 years.   Patient currently smoking 2 to 3 cigarettes/day.   Patient has never used smokeless tobacco.   Patient with occasional use of alcohol.   Patient last used cocaine approximately 6 months ago.  Patient denies use of marijuana.   Social Determinants of Health   Financial Resource Strain: Not on file  Food Insecurity: No Food Insecurity   Worried About Charity fundraiser in the Last Year: Never true   Ran Out of Food in the Last Year: Never true  Transportation Needs: No Transportation Needs   Lack of Transportation (Medical): No   Lack of Transportation (Non-Medical): No  Physical Activity: Not on file  Stress: Not on file  Social Connections: Unknown   Frequency of Communication with Friends and Family: Three times a week   Frequency of Social Gatherings with Friends and Family: More than three times a week   Attends Religious Services: Not on file   Active Member of Clubs or Organizations: Not on file   Attends Archivist Meetings: Not on file   Marital Status: Not on file  Intimate Partner Violence: Not on file    FAMILY HISTORY: Family History  Problem Relation Age of Onset   Breast cancer Sister    Colon cancer Brother 44       ????   Cancer Brother     ALLERGIES:  has No Known Allergies.  MEDICATIONS:  Current Outpatient Medications  Medication Sig Dispense Refill   atropine 1 % ophthalmic solution Place 1 drop into the right eye 2 (two) times a day.      guaiFENesin (ROBITUSSIN) 100 MG/5ML SOLN Place 10 mLs (200 mg total) into feeding tube 2 (two) times daily. 236 mL 0   lidocaine (XYLOCAINE) 2 % solution Use as directed 10 mLs in the mouth or throat every 6 (six) hours as needed for mouth pain. Mix 1 part of lidocaine with 1 part of water, swish and swallow 100 mL 2   lidocaine-prilocaine (EMLA) cream Apply to affected area once 30 g 3   Nutritional Supplements ([employer] STANDARD 1.4) LIQD 487 mLs by Enteral route 3 (three) times daily. 1.5 cartons (487  ml) [employer] 1.4 TID with 60 ml free water before and 60 ml free water after each TF bolus. Provide an additional 120 ml free water TID.     ondansetron (ZOFRAN) 8 MG tablet Take 1 tablet (8 mg total) by mouth 2 (two) times daily as needed for refractory nausea / vomiting. Start on day 3 after chemo. 30 tablet 1   oxyCODONE (OXY IR/ROXICODONE) 5 MG immediate release tablet Take 1 tablet (5 mg total) by mouth every 6 (six) hours as needed for severe pain. 120 tablet 0   pantoprazole sodium (PROTONIX) 40 mg/20 mL PACK Place 20 mLs (40 mg total) into feeding tube 2 (two) times daily. 3000 mL 1   prednisoLONE acetate (PRED FORTE) 1 % ophthalmic suspension Place 1 drop into the right eye 4 (four) times daily.     prochlorperazine (COMPAZINE) 10 MG tablet Take 1 tablet (10 mg total) by mouth every 6 (six) hours as needed (Nausea or vomiting). 30 tablet 1   Vitamin D, Ergocalciferol, (DRISDOL) 1.25 MG (50000 UNIT) CAPS capsule Take 50,000 Units by mouth every Saturday.     Water For Irrigation, Sterile (FREE WATER) SOLN Place 100 mLs into feeding tube every 4 (four) hours.     No current facility-administered medications for this visit.    PHYSICAL EXAMINATION:   ECOG PERFORMANCE STATUS: 0 - Asymptomatic  Vitals:   07/04/21 1046  BP: 108/86  Pulse: 100  Resp: 16  Temp: 98.6 F (37 C)  SpO2: 98%    Filed Weights  07/04/21 1046  Weight: 108 lb 11.2 oz (49.3 kg)    Physical Exam Constitutional:      General: He is not in acute distress.    Comments: Frail   HENT:     Head: Normocephalic.     Mouth/Throat:     Mouth: Mucous membranes are moist.     Pharynx: No oropharyngeal exudate or posterior oropharyngeal erythema.  Neck:     Comments: No palpable lymphadenopathy  Cardiovascular:     Rate and Rhythm: Normal rate and regular rhythm.  Pulmonary:     Effort: Pulmonary effort is normal.  Abdominal:     General: Abdomen is flat.     Palpations: Abdomen is soft.     Comments:  G tube appears ok. Some mild tenderness around G tube,  Musculoskeletal:        General: No swelling or tenderness.     Cervical back: Normal range of motion and neck supple. No rigidity.  Skin:    General: Skin is warm and dry.  Neurological:     General: No focal deficit present.     Mental Status: He is alert.  Psychiatric:        Mood and Affect: Mood normal.    LABORATORY DATA:  I have reviewed the data as listed Lab Results  Component Value Date   WBC 4.4 07/04/2021   HGB 10.4 (L) 07/04/2021   HCT 32.0 (L) 07/04/2021   MCV 91.4 07/04/2021   PLT 305 07/04/2021     Chemistry      Component Value Date/Time   NA 137 07/04/2021 1025   K 4.1 07/04/2021 1025   CL 101 07/04/2021 1025   CO2 27 07/04/2021 1025   BUN 13 07/04/2021 1025   CREATININE 0.59 (L) 07/04/2021 1025   CREATININE 0.61 (L) 10/26/2019 1523      Component Value Date/Time   CALCIUM 9.6 07/04/2021 1025   ALKPHOS 50 07/04/2021 1025   AST 10 (L) 07/04/2021 1025   ALT 7 07/04/2021 1025   ALT 53 (H) 09/06/2019 1543   BILITOT 0.2 (L) 07/04/2021 1025     Labs from today reviewed okay to proceed.  RADIOGRAPHIC STUDIES: I have personally reviewed the radiological images as listed and agreed with the findings in the report. IR Radiologist Eval & Mgmt  Result Date: 07/03/2021 INDICATION: Patient with history of head and neck cancer status post gastrostomy tube placement by IR on September 24, 2020. Patient presents for hypergranulation tissue around gastrostomy tube exit site. EXAM: REMOVAL TUNNELED CENTRAL VENOUS CATHETER MEDICATIONS: Lidocaine 1% 4 mL.  Silver nitrate sticks 10 in total. ANESTHESIA/SEDATION: Moderate (conscious) sedation was not employed during this procedure. FLUOROSCOPY TIME:  Fluoroscopy Time: none COMPLICATIONS: None immediate. PROCEDURE: Informed verbal consent was obtained from the patient after a thorough discussion of the procedural risks, benefits and alternatives. All questions were  addressed. Maximal Sterile Barrier Technique was utilized including caps, mask, sterile gowns, sterile gloves, sterile drape, hand hygiene and skin antiseptic. A timeout was performed prior to the initiation of the procedure. The patient's gastrostomy tube exit site was prepped and draped in a normal sterile fashion. 1% lidocaine was used for local anesthesia. This silver nitrate was then applied to the affected area. The flange of the existing gastrostomy tube was then loosened for patient comfort. The patient tolerated the procedure well with no immediate complications. IMPRESSION: Pre-existing gastrostomy tube noted to be functional and in good position. Technically successful treatment for hyper granulation tissue with silver  nitrate. Read by: Rushie Nyhan, NP Electronically Signed   By: Corrie Mckusick D.O.   On: 07/03/2021 08:02    PDL1 negative < 1%  All questions were answered. The patient knows to call the clinic with any problems, questions or concerns.  I spent 30 minutes in the care of this patient including H and P, review of records, counseling and coordination of care. We have reviewed current treatment plan, labs, adverse effects and follow-up recommendations.   Alan Pike, MD 07/04/2021 11:26 AM

## 2021-07-04 NOTE — Assessment & Plan Note (Signed)
Stable since last visit Continues on G tube feeds.

## 2021-07-04 NOTE — Assessment & Plan Note (Signed)
This is a very 36 pleasant male patient with laryngeal cancer s/p previous definitive RT in 2020 and most recently had concurrent CRT completed 10/2020. He had end of treatment PET scan which showed bilateral lymphadenopathy which is concerning for residual disease. He is unfortunately not a candidate for any more radiation and surgery. He was recommended palliative chemotherapy or immunotherapy His performance status was also borderline and he was very frail when he presented hence we have agreed to try single agent chemotherapy. We agreed to try single agent docetaxel at 75% of dose. He had 2 cycles of chemotherapy and was recently admitted for perforated duodenal ulcer.  He had recovered well from the perforated duodenal ulcer however his recent imaging is consistent with progression in both the primary lesion as well as the lymphadenopathy.  During his last visit, we have discussed about progression on docetaxel and recommended considering chemoimmunotherapy.  He also saw Dr. Maxie Better for second opinion at [facility] and he wanted to proceed with treatment here.  I had a discussion with Dr. Maxie Better who suggested trying weekly CarboTaxol given concern for tolerance as well as every 21-day Keytruda, reassess response after 2 cycles. We have discussed common side effects from immunotherapy including fatigue, dry skin, hypo-/hyperthyroidism, diarrhea, hepatitis etc.  We have also discussed that less than 5% of the side effects from immunotherapy can be life-threatening.  He is willing to take risks and try this regimen.  He understands that if this regimen does not help him with his current disease, he may have to consider hospice.  He started C1D1 on 06/13/2021 He has been doing well so far. ROS consistent with fatigue and grade 1 neuropathy PE unremarkable, no concerns. CBC and CMP reviewed, ok to proceed with chemo and immunotherapy as planned. Repeat imaging nov 7 th week to assess response and discuss  treatment recommendations accordingly. He will FU with Dr Alvy Bimler.

## 2021-07-04 NOTE — Assessment & Plan Note (Signed)
We have discussed about trying pain medication every 6 hours as needed for pain management. No change in pain medicine needs Will continue to monitor.

## 2021-07-04 NOTE — Assessment & Plan Note (Signed)
Grade 1 and mild.  We will monitor for now. He was advised to let us know if it continues to worsen. He expressed understanding

## 2021-07-08 ENCOUNTER — Telehealth: Payer: Self-pay

## 2021-07-08 NOTE — Telephone Encounter (Signed)
This nurse reached out to patients daughter, [name] and left a message related to scheduled CT scan.  Provided date and time of 07/18/21 at 230 pm.  Provided the number for central scheduling if the time needed to be changed.  Patient knows to call clinic if there are any further questions or concerns.

## 2021-07-10 ENCOUNTER — Other Ambulatory Visit: Payer: Self-pay | Admitting: Hematology and Oncology

## 2021-07-10 ENCOUNTER — Telehealth: Payer: Self-pay

## 2021-07-10 NOTE — Telephone Encounter (Signed)
Called and spoke with pt caregiver for rescheduling appointment to 11/7.  Caregiver verbalized understanding

## 2021-07-11 ENCOUNTER — Ambulatory Visit (INDEPENDENT_AMBULATORY_CARE_PROVIDER_SITE_OTHER): Payer: Medicare HMO | Admitting: Physician Assistant

## 2021-07-11 ENCOUNTER — Other Ambulatory Visit: Payer: Medicare HMO

## 2021-07-11 ENCOUNTER — Encounter: Payer: Self-pay | Admitting: Physician Assistant

## 2021-07-11 VITALS — BP 98/52 | HR 88 | Ht 68.0 in | Wt 103.4 lb

## 2021-07-11 DIAGNOSIS — R768 Other specified abnormal immunological findings in serum: Secondary | ICD-10-CM

## 2021-07-11 DIAGNOSIS — Z931 Gastrostomy status: Secondary | ICD-10-CM | POA: Diagnosis not present

## 2021-07-11 DIAGNOSIS — K251 Acute gastric ulcer with perforation: Secondary | ICD-10-CM

## 2021-07-11 DIAGNOSIS — C329 Malignant neoplasm of larynx, unspecified: Secondary | ICD-10-CM

## 2021-07-11 MED ORDER — PANTOPRAZOLE SODIUM 40 MG PO PACK: 40.0000 mg | PACK | Freq: Every day | ORAL | 11 refills | Status: DC

## 2021-07-11 NOTE — Patient Instructions (Addendum)
If you are age 68 or older, your body mass index should be between 23-30. Your Body mass index is 15.72 kg/m. If this is out of the aforementioned range listed, please consider follow up with your Primary Care Provider. ________________________________________________________  The [facility] providers would like to encourage you to use [facility] to communicate with providers for non-urgent requests or questions.  Due to long hold times on the telephone, sending your provider a message by [facility] may be a faster and more efficient way to get a response.  Please allow 48 business hours for a response.  Please remember that this is for non-urgent requests.  _______________________________________________________   Your provider has requested that you go to the basement level for lab work before leaving today. Press "B" on the elevator. The lab is located at the first door on the left as you exit the elevator.  STOP your Pantoprazole starting on October 29, stay off for 10 days. Do your stool specimen on 11/9 or 11/10, then restart Pantoprazole 40 mg once daily.  Do not take Advil or aleve, you can take Tylenol   Follow up pending as needed.  Thank you for entrusting me with your care and choosing [facility].  Amy Esterwood, PA-C

## 2021-07-11 NOTE — Progress Notes (Signed)
Agree with assessment and plan as outlined.  

## 2021-07-11 NOTE — Progress Notes (Signed)
Subjective:    Patient ID: [name], male    DOB: 25-Jul-1951, 70 y.o.   MRN: [id number]  HPI [name] is a pleasant 70 year old African-American male, established with Dr. Havery Moros who had not been seen in our office since 2020.  He had undergone colonoscopy in June 2020 which was negative with the exception of a small tubular adenoma.  He was also diagnosed with hepatitis C around that time and was to be referred to ID for treatment however I believe he was diagnosed with right supraglottic squamous cell cancer shortly thereafter.  He was treated with radiation therapy and then completed concurrent CRT in February 2022.  End of treatment PET scan showed bilateral lymphadenopathy concerning for residual disease.  He has since been initiated with Docetaxel.  After he completed 2 cycles of chemotherapy this summer he required admission in July 2022 for a perforated duodenal ulcer in the first portion of the duodenum.  He underwent surgery with a Phillip Heal patch.  Patient also had a previously placed PEG tube, which she continues to use.  At the time of surgery he was found to be H. pylori positive and by the discharge summary he was given a course of clarithromycin and amoxicillin x14 days on discharge and plan was for eventual GI follow-up.  He says he has been doing okay over the past couple of months, he denies any ongoing abdominal pain or discomfort, no nausea or vomiting.  He is not able to take much p.o., just a little bit of liquids and continues on PEG feedings 4 times daily.  He is not aware of any melena or hematochezia.  He continues on Protonix 40 mg twice daily.  On further questioning he says he has been taking Advil intermittently for pain.  He is scheduled to undergo restaging imaging with CT of the chest and soft tissue neck as well as swallowing eval next week.  PET scan in May 2022 did not show any abnormal hypermetabolic activity in the abdomen. CT of the abdomen and pelvis  04/12/2021 showed no focal liver abnormality, gastrostomy tube in the expected location, there is some contrast material in the peritoneal cavity of the upper abdomen suggesting leak along the gastrostomy tract, and a 1.3 cm right middle lobe nodule new since 01/2021.  CT imaging from August showed progression of the lesion in the right tonsil with central fluid collection and peripheral enhancement possibly representing tumor versus abscess and progressive malignant lymphadenopathy in the neck.  Review of Systems.  Pertinent positive and negative review of systems were noted in the above HPI section.  All other review of systems was otherwise negative.   Outpatient Encounter Medications as of 07/11/2021  Medication Sig   atropine 1 % ophthalmic solution Place 1 drop into the right eye 2 (two) times a day.    guaiFENesin (ROBITUSSIN) 100 MG/5ML SOLN Place 10 mLs (200 mg total) into feeding tube 2 (two) times daily.   lidocaine (XYLOCAINE) 2 % solution Use as directed 10 mLs in the mouth or throat every 6 (six) hours as needed for mouth pain. Mix 1 part of lidocaine with 1 part of water, swish and swallow   lidocaine-prilocaine (EMLA) cream Apply to affected area once   Nutritional Supplements ([employer] STANDARD 1.4) LIQD 487 mLs by Enteral route 3 (three) times daily. 1.5 cartons (487 ml) [employer] 1.4 TID with 60 ml free water before and 60 ml free water after each TF bolus. Provide an additional 120  ml free water TID.   ondansetron (ZOFRAN) 8 MG tablet Take 1 tablet (8 mg total) by mouth 2 (two) times daily as needed for refractory nausea / vomiting. Start on day 3 after chemo.   oxyCODONE (OXY IR/ROXICODONE) 5 MG immediate release tablet Take 1 tablet (5 mg total) by mouth every 6 (six) hours as needed for severe pain.   prednisoLONE acetate (PRED FORTE) 1 % ophthalmic suspension Place 1 drop into the right eye 4 (four) times daily.   prochlorperazine (COMPAZINE) 10 MG tablet Take 1 tablet (10 mg  total) by mouth every 6 (six) hours as needed (Nausea or vomiting).   Vitamin D, Ergocalciferol, (DRISDOL) 1.25 MG (50000 UNIT) CAPS capsule Take 50,000 Units by mouth every Saturday.   Water For Irrigation, Sterile (FREE WATER) SOLN Place 100 mLs into feeding tube every 4 (four) hours.   [DISCONTINUED] pantoprazole sodium (PROTONIX) 40 mg/20 mL PACK Place 20 mLs (40 mg total) into feeding tube 2 (two) times daily.   pantoprazole sodium (PROTONIX) 40 mg Place 40 mg into feeding tube daily.   No facility-administered encounter medications on file as of 07/11/2021.   No Known Allergies Patient Active Problem List   Diagnosis Date Noted   Neuropathy due to chemotherapeutic drug ([place]) 07/04/2021   Weight loss, unintentional 06/20/2021   Protein-calorie malnutrition, severe 04/17/2021   Perforated gastric ulcer ([place])    Gastrostomy tube dysfunction ([place]) 04/12/2021   Anemia due to antineoplastic chemotherapy 03/26/2021   Cancer related pain 03/26/2021   Laryngeal edema 03/11/2021   Nausea and vomiting 01/24/2021   Shortness of breath 01/10/2021   Mucositis due to antineoplastic therapy 01/10/2021   G tube feedings ([place]) 01/10/2021   Chemotherapy induced diarrhea 10/30/2020   Port-A-Cath in place 10/23/2020   Cancer of tonsillar fossa ([place]) 08/31/2020   Laryngeal cancer ([place]) 01/06/2020   Metastatic cancer to cervical lymph nodes ([place]) 12/01/2019   Chronic hepatitis C without hepatic coma ([place]) 06/09/2019   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[place]) 05/02/2019   Squamous cell carcinoma of neck 04/01/2019   Social History   Socioeconomic History   Marital status: Single    Spouse name: Not on file   Number of children: 2   Years of education: Not on file   Highest education level: Not on file  Occupational History   Not on file  Tobacco Use   Smoking status: Former    Years: 50.00    Types: Cigarettes    Quit date: 06/01/2020    Years since quitting: 1.1    Smokeless tobacco: Never  Vaping Use   Vaping Use: Never used  Substance and Sexual Activity   Alcohol use: Yes    Alcohol/week: 4.0 standard drinks    Types: 4 Cans of beer per week    Comment: 40oz beer daily   Drug use: Yes    Types: Cocaine    Comment: none in 2 yrs   Sexual activity: Yes    Partners: Female  Other Topics Concern   Not on file  Social History Narrative   Patient is divorced with 2 children.   Patient is currently living with his sister.   Patient with a history of smoking a third of pack of cigarettes daily for 50 years.  Patient currently smoking 2 to 3 cigarettes/day.   Patient has never used smokeless tobacco.   Patient with occasional use of alcohol.   Patient last used cocaine approximately 6 months ago.  Patient denies use  of marijuana.   Social Determinants of Health   Financial Resource Strain: Not on file  Food Insecurity: No Food Insecurity   Worried About Charity fundraiser in the Last Year: Never true   Ran Out of Food in the Last Year: Never true  Transportation Needs: No Transportation Needs   Lack of Transportation (Medical): No   Lack of Transportation (Non-Medical): No  Physical Activity: Not on file  Stress: Not on file  Social Connections: Unknown   Frequency of Communication with Friends and Family: Three times a week   Frequency of Social Gatherings with Friends and Family: More than three times a week   Attends Religious Services: Not on file   Active Member of Clubs or Organizations: Not on file   Attends Archivist Meetings: Not on file   Marital Status: Not on file  Intimate Partner Violence: Not on file    Mr. Kolodziejski family history includes Breast cancer in his sister; Cancer in his brother; Colon cancer (age of onset: 64) in his brother.      Objective:    Vitals:   07/11/21 1414  BP: (!) 98/52  Pulse: 88    Physical Exam Well-developed thin frail older African-American male in no acute distress.   Accompanied by his wife  height, Weight, 103 BMI 15.7  HEENT; nontraumatic normocephalic, EOMI, PE R LA, sclera anicteric. Oropharynx; not examined today Neck; supple, no JVD Cardiovascular; regular rate and rhythm with S1-S2, no murmur rub or gallop Pulmonary; Clear bilaterally Abdomen; soft, scaphoid, nontender, nondistended, no palpable mass or hepatosplenomegaly, bowel sounds are active, midline scar, PEG tube in place Rectal; not done today Skin; benign exam, no jaundice rash or appreciable lesions Extremities; no clubbing cyanosis or edema skin warm and dry Neuro/Psych; alert and oriented x4, grossly nonfocal mood and affect appropriate        Assessment & Plan:   #1 number one 70 year old African-American male with 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 Currently undergoing therapy with C1 D1 Repeat staging imaging planned for next week  #2 perforated duodenal ulcer July 2022 possibly associated with chemotherapy Status post surgical repair with Phillip Heal patch Patient found to be H. pylori positive and was treated with a course of therapy with clarithromycin and amoxicillin on discharge from the hospital early August 2022  No current complaints of abdominal pain or discomfort.  Remains on twice daily Protonix, believes he completed the course of antibiotics  #3 PEG tube in place-remains on PEG feedings #4 hepatitis C positive-not treated-no evidence of cirrhosis by most recent CT imaging-not appropriate candidate for treatment at this time.  Plan; Will check H. pylori stool antigen to confirm eradication of H. pylori.  Have asked him to stop twice daily PPI x10 days then complete H. pylori stool antigen. Thereafter he needs to resume Protonix 40 mg p.o. every morning AC breakfast long-term.  Refills have been  sent. Patient was advised to stop any aspirin or NSAID use and use Tylenol instead as needed I do not think that he needs to undergo endoscopic evaluation at this time. Will follow-up on restaging imaging planned for next week.   Kyleah Pensabene S Zannie Locastro PA-C 07/11/2021   Cc: [name], MD

## 2021-07-12 ENCOUNTER — Telehealth: Payer: Self-pay

## 2021-07-12 NOTE — Telephone Encounter (Signed)
Prior authorization has been started for Pantoprazole

## 2021-07-18 ENCOUNTER — Ambulatory Visit (HOSPITAL_COMMUNITY)
Admission: RE | Admit: 2021-07-18 | Discharge: 2021-07-18 | Disposition: A | Discharge status: Home / Self Care | Payer: Medicare HMO | Source: Ambulatory Visit | Attending: Hematology and Oncology | Admitting: Hematology and Oncology

## 2021-07-18 ENCOUNTER — Other Ambulatory Visit: Payer: Self-pay

## 2021-07-18 DIAGNOSIS — C329 Malignant neoplasm of larynx, unspecified: Secondary | ICD-10-CM | POA: Insufficient documentation

## 2021-07-18 MED ORDER — IOHEXOL 350 MG/ML SOLN
80.0000 mL | Freq: Once | INTRAVENOUS | Status: AC | PRN
  Administered 2021-07-18: 80 mL via INTRAVENOUS

## 2021-07-19 NOTE — Telephone Encounter (Signed)
Information regarding your request Available without authorization.

## 2021-07-22 ENCOUNTER — Inpatient Hospital Stay: Payer: Medicare HMO | Admitting: Hematology and Oncology

## 2021-07-23 ENCOUNTER — Encounter: Payer: Self-pay | Admitting: Hematology and Oncology

## 2021-07-23 ENCOUNTER — Other Ambulatory Visit: Payer: Self-pay

## 2021-07-23 ENCOUNTER — Inpatient Hospital Stay: Payer: Medicare HMO | Attending: Hematology and Oncology | Admitting: Hematology and Oncology

## 2021-07-23 VITALS — BP 130/82 | HR 94 | Temp 98.3°F | Resp 18 | Ht 68.0 in | Wt 104.2 lb

## 2021-07-23 DIAGNOSIS — Z923 Personal history of irradiation: Secondary | ICD-10-CM | POA: Insufficient documentation

## 2021-07-23 DIAGNOSIS — Z8701 Personal history of pneumonia (recurrent): Secondary | ICD-10-CM | POA: Insufficient documentation

## 2021-07-23 DIAGNOSIS — C329 Malignant neoplasm of larynx, unspecified: Secondary | ICD-10-CM | POA: Diagnosis present

## 2021-07-23 DIAGNOSIS — E43 Unspecified severe protein-calorie malnutrition: Secondary | ICD-10-CM | POA: Diagnosis not present

## 2021-07-23 DIAGNOSIS — D6481 Anemia due to antineoplastic chemotherapy: Secondary | ICD-10-CM | POA: Diagnosis not present

## 2021-07-23 DIAGNOSIS — C321 Malignant neoplasm of supraglottis: Secondary | ICD-10-CM | POA: Diagnosis not present

## 2021-07-23 DIAGNOSIS — Z79899 Other long term (current) drug therapy: Secondary | ICD-10-CM | POA: Insufficient documentation

## 2021-07-23 DIAGNOSIS — T451X5A Adverse effect of antineoplastic and immunosuppressive drugs, initial encounter: Secondary | ICD-10-CM

## 2021-07-23 DIAGNOSIS — R1310 Dysphagia, unspecified: Secondary | ICD-10-CM | POA: Insufficient documentation

## 2021-07-23 DIAGNOSIS — C090 Malignant neoplasm of tonsillar fossa: Secondary | ICD-10-CM

## 2021-07-23 DIAGNOSIS — G893 Neoplasm related pain (acute) (chronic): Secondary | ICD-10-CM | POA: Diagnosis not present

## 2021-07-23 DIAGNOSIS — R1319 Other dysphagia: Secondary | ICD-10-CM

## 2021-07-23 DIAGNOSIS — Z5111 Encounter for antineoplastic chemotherapy: Secondary | ICD-10-CM | POA: Insufficient documentation

## 2021-07-23 DIAGNOSIS — Z8616 Personal history of COVID-19: Secondary | ICD-10-CM | POA: Diagnosis not present

## 2021-07-23 MED ORDER — OXYCODONE HCL 5 MG PO TABS: 5.0000 mg | ORAL_TABLET | Freq: Four times a day (QID) | ORAL | 0 refills | Status: DC | PRN

## 2021-07-23 NOTE — Assessment & Plan Note (Addendum)
[name] is a patient with complicated history of supraglottic tumor with cancer recurrence in the head and neck region Most recently, he underwent palliative treatment with weekly carboplatin and paclitaxel and pembrolizumab every 21 days His recent CT imaging is independently reviewed by myself whiched show slight improved disease control He tolerated treatment well I recommend we proceed with a few more months treatment with plan to repeat imaging study again in January 2023 I will see him next month for further follow-up and toxicity review

## 2021-07-24 ENCOUNTER — Encounter: Payer: Self-pay | Admitting: Hematology and Oncology

## 2021-07-24 DIAGNOSIS — R1310 Dysphagia, unspecified: Secondary | ICD-10-CM | POA: Insufficient documentation

## 2021-07-24 NOTE — Assessment & Plan Note (Signed)
This is well controlled.  I refilled his prescription pain medicine today We discussed narcotic refill policy

## 2021-07-24 NOTE — Progress Notes (Signed)
[place] progress notes  Patient Care Team: [name], MD as PCP - General (Internal Medicine) Izora Gala, MD as Consulting Physician (Otolaryngology) [name], MD as Attending Physician (Radiation Oncology) Leota Sauers, RN (Inactive) as Oncology Nurse Navigator Karie Mainland, RD as Dietitian (Nutrition) Valentino Saxon Perry Mount, CCC-SLP as Speech Language Pathologist (Speech Pathology) Kennith Center, LCSW as Social Worker [name], MD as Consulting Physician (Hematology and Oncology) Malmfelt, Stephani Police, RN as Oncology Nurse Navigator  CHIEF COMPLAINTS/PURPOSE OF VISIT:  Recurrent squamous cell carcinoma of the head and neck, for further management  HISTORY OF PRESENTING ILLNESS:  [name] 70 y.o. male was transferred to my care after his primary oncologist went away from maternity leave.  He is here accompanied by his friend and sister He was recently placed on palliative treatment with weekly carboplatin and paclitaxel and pembrolizumab He seemed to tolerated treatment fairly well without major side effects He denies nausea & vomiting His chronic cancer pain is stable on current prescribed pain medicine He has bowel movement every other day He is only using 3 cans of nutritional supplement per day through his feeding tube He has not lost any weight; he is known to have chronic dysphagia and history of aspiration pneumonia.  He has not been seen and reassessed by speech and language therapist since early this year  I reviewed the patient's records extensive and collaborated the history with the patient. Summary of his history is as follows: Oncology History Overview Note  Recurrent SCC, p16 neg   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[place])  04/01/2019 Procedure   Direct laryngoscopy w/ debulking of the tumor arising from the lateral surface of the right aryepiglottic fold and anterior/lateral aspect of the piriform  sinus on the right side    04/01/2019 Pathology Results   Accession: [id number]  Larynx, biopsy, Right Supraglottic Tumor - POORLY DIFFERENTIATED SQUAMOUS CELL CARCINOMA WITH FOCAL SARCOMATOID CHANGES. SEE NOTE   04/01/2019 Surgery   Pre-Op Dx:   right supraglottic carcinoma   Post-op Dx: T2 N0 M0 (stage II) right supraglottic carcinoma   Proc: Direct laryngoscopy with biopsy.  Cervical esophagoscopy.  Bronchoscopy.   Surg:  [name]    Findings: A bulky necrotic and semi-pedunculated tumor coming off the lateral surface of the right aryepiglottic fold and the anterior and lateral aspect of the piriform sinus on the right side.  Airway was compromised by the tumor mass at intubation.   04/11/2019 Imaging   CT neck: IMPRESSION: The glottis is closed with suboptimal evaluation of the vocal cords. No asymmetry of the cords. Correlate with laryngoscopy results.   No enlarged lymph nodes in the neck. 7 mm right level 2 lymph node may be reactive. No definite pathologic lymph nodes in the neck.   04/11/2019 Imaging   CT neck  This CT was performed after surgical debulking of an exophytic laryngeal tumor.   Most apparent on series 2, image 51 there is abnormal asymmetric hyperenhancement along the surface of the right piriform sinus, and perhaps also involving the bilateral AE folds.   04/22/2019 Imaging   PET: IMPRESSION: 1. Mild residual activity in the posterior RIGHT hypopharynx confined to the mucosa. 2. No evidence of hypermetabolic metastatic lymph nodes in LEFT or RIGHT neck. 3. No evidence distant metastatic disease.   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[place])   05/12/2019 - 07/06/2019 Radiation Therapy   Radiation  Treatment Dates: 05/12/2019 through 07/06/2019 Site Technique Total Dose (Gy) Dose per Fx (Gy) Completed Fx Beam Energies  Head & neck: HN_larynx IMRT 70/70 2 35/35 6X     Radiation Treatment Dates:  09/18/2020 through 10/31/2020 Site Technique Total Dose (Gy) Dose per Fx (Gy) Completed Fx Beam Energies  Oropharynx: HN_orophar IMRT 60/60 2 30/30 6X        10/27/2019 PET scan   1. New FDG avid right level 2 cervical lymph node concerning for recurrent disease. No findings of distant metastatic disease within the chest, abdomen or pelvis. 2. Mild nonspecific increased uptake in the right side of larynx is similar to previous exam. 3. Aortic Atherosclerosis (ICD10-I70.0). Coronary artery calcifications.    11/21/2019 Pathology Results   SURGICAL PATHOLOGY  CASE: [id number]  [name]  Surgical Pathology Report   FINAL MICROSCOPIC DIAGNOSIS:   A. LYMPH NODE, RIGHT CERVICAL:  - Poorly differentiated carcinoma consistent with metastatic squamous cell carcinoma.  - See comment   01/06/2020 Pathology Results   SURGICAL PATHOLOGY  CASE: [id number]  [name]  Surgical Pathology Report   Clinical History: metastatic cancer to cervical lymph nodes (cm)   FINAL MICROSCOPIC DIAGNOSIS:   A. LYMPH NODE, RIGHT MODIFIED, DISSECTION:  - Metastatic squamous cell carcinoma in four of nineteen lymph nodes (4/19).  - Largest metastatic nodule is 3.1 cm with 5 mm extra-nodal extension.  - Lymphovascular space involvement in peri-nodal connective tissue.  - See comment.    01/06/2020 Surgery   PRE-OPERATIVE DIAGNOSIS:  METASTATIC CANCER TO CERVICAL LYMPH NODES   POST-OPERATIVE DIAGNOSIS:  METASTATIC CANCER TO CERVICAL LYMPH NODES   PROCEDURE:  Procedure(s): DIRECT LARYNGOSCOPY MODIFIED RADICAL NECK DISSECTION   SURGEON:  [name], MD     SPECIMEN: Right modified neck dissection including levels 2 and 3, including the internal jugular vein, suture marks the inferior jugular vein stump.   07/05/2020 Imaging   1. Sequelae of radiation and right neck dissection from the previous contrasted Neck CT.   2. Asymmetric soft tissue thickening and enhancement along  the right lateral pharynx. Although this might be asymmetric mucositis, the appearance is suspicious and recommend direct inspection. Kizzy Olafson-RADS category 2a.   3. Suspicious small 6 mm hyperenhancing nodular soft tissue along the posterior margin of the neck dissection at right level 3. Tobi Groesbeck-RADS category 2 vs 3 - although this is likely too small for imaging guided biopsy. Repeat PET-CT may be most valuable.   4. Mild inflammatory appearing right upper lobe centrilobular ground-glass opacity, new since February. Consider mild or developing right upper lobe infection. Post radiation changes to the lung apices.       08/13/2020 Pathology Results   A. OROPHARNYX, BIOPSY:  - Poorly differentiated squamous cell carcinoma.  - See comment.    08/13/2020 Surgery   POST-OPERATIVE DIAGNOSIS:  Tonsillary Mass History of Laryngeal  Cancer   PROCEDURE:  Procedure(s): DIRECT LARYNGOSCOPY BIOPSY OF OROPHARYNGEAL MASS   08/30/2020 PET scan   1. Recurrence of head neck carcinoma with a broad hypermetabolic pharyngeal mucosal lesion involving the RIGHT lateral posterior oropharynx and hypopharynx extending from the palatine tonsil to the vallecula. 2. Hypermetabolic lymph node posterior sternocleidomastoid muscle on the RIGHT (level 3). 3. Hypermetabolic nodule within the LEFT parotid glands favored small primary parotid neoplasm. 4. No evidence thoracic metastasis   09/24/2020 - 10/30/2020 Chemotherapy   He received weekly cisplatin   01/31/2021 PET scan   Findings of worsening nodal disease with a node or group of  nodes in the LEFT neck and a RIGHT supraclavicular lymph node as discussed.   Post treatment changes with soft tissue fullness in the area of the prior tumor but with markedly diminished metabolic activity.     03/11/2021 Imaging   Ct neck  Diffuse pharyngeal and laryngeal mucosal and submucosal edema probably related to acute radiation injury. The airway is narrow and could possibly be  critically narrowed. I do not see any evidence of recurrent mucosal or submucosal tumor identifiable.   Similar appearance of the recurrent malignant lymphadenopathy on the left at level 2 and level 3 and on the right in the low supraclavicular region just above and behind the clavicular head.   04/12/2021 Imaging   Ct abdomen and pelvis  1. Contrast material in the peritoneal cavity of the upper abdomen suggesting leak along the gastrostomy tract. 2. Gastrostomy tube is appropriately positioned. No evidence of abscess. 3. 1.3 cm right middle lobe nodule, new since 01/31/2021. Favor infectious/inflammatory over primary/secondary neoplasm given the relatively short time frame of development.   04/12/2021 Surgery   Postoperative Diagnosis: Perforated duodenal ulcer (1st portion)   Surgical Procedure:  Modified Graham patch of duodenal ulcer Gastrostomy tube exchange   Operative Team Members:  Surgeon(s) and Role:    * Stechschulte, Nickola Major, MD - Primary     Drains:  (19 Fr) Jackson-Pratt drain(s) with closed bulb suction in the right upper quadrant near the duodenal ulcer repair and Gastrostomy Tube 26 fr   05/09/2021 Imaging   1. Progression of lesion in the right tonsil with central fluid collection and peripheral enhancement. This was a site of prior recurrent sick could represent progressive tumor or abscess. Continued follow-up recommended. 2. Progressive malignant lymphadenopathy in the left neck. Stable lymphadenopathy right supraclavicular region. 3. New area of stranding in the superior segment left lower lobe but certain etiology. Attention on follow-up recommended.     06/13/2021 -  Chemotherapy   Patient is on Treatment Plan :  Paclitaxel + Carboplatin q7d     06/13/2021 -  Chemotherapy   Patient is on Treatment Plan : HEAD/NECK Pembrolizumab Q21D     07/18/2021 Imaging   Ct chest 1. No highly specific findings identified to suggest pulmonary or nodal metastasis within the  chest 2. Multifocal patchy areas of ground-glass and nodular airspace densities. Surrounding postinflammatory changes are noted with thickening of the peribronchovascular interstitium. Imaging findings are concerning for sequelae of aspiration and/or multifocal infection. Follow-up imaging after appropriate antibiotic therapy is recommended with repeat CT of the chest in 3 months to ensure resolution and to rule out the possibility of underlying metastatic nodule. 3. Coronary artery calcifications. 4. Aortic Atherosclerosis (ICD10-I70.0) and Emphysema (ICD10-J43.9).   07/18/2021 Imaging   CT neck  1. Positive treatment response with decreased size of nodal metastases in the left jugular chain. There is likely residual active tumor in the treated nodes. 2. Ulcerated lesions in the bilateral palatine fossa, progressed on the left and suspicious for synchronous tumors.   07/23/2021 Cancer Staging   Staging form: Larynx - Supraglottis, AJCC 8th Edition - Clinical stage from 07/23/2021: Stage IVA (rcT2, cN2b, cM0) - Signed by [name], MD on 07/23/2021 Stage prefix: Recurrence      MEDICAL HISTORY:  Past Medical History:  Diagnosis Date   Cancer ([place])    Throat cancer 2019   ETOH abuse    Frequent urination    Glaucoma    Hepatitis C virus infection without hepatic coma  dx'ed in 11/2018   History of radiation therapy 05/12/19- 07/06/19   Larynx   Hypertension    Wears denture    upper only; lost lower denture    SURGICAL HISTORY: Past Surgical History:  Procedure Laterality Date   ANKLE SURGERY  2011   right ankle   COLONOSCOPY  02/2019   polyps - Dr Havery Moros   DIRECT LARYNGOSCOPY N/A 04/01/2019   Procedure: DIRECT LARYNGOSCOPY WITH BIOPSY;  Surgeon: Jodi Marble, MD;  Location: [facility];  Service: ENT;  Laterality: N/A;   DIRECT LARYNGOSCOPY N/A 01/06/2020   Procedure: DIRECT LARYNGOSCOPY;  Surgeon: Izora Gala, MD;  Location: [facility] OR;  Service: ENT;  Laterality: N/A;    DIRECT LARYNGOSCOPY N/A 08/13/2020   Procedure: DIRECT LARYNGOSCOPY;  Surgeon: Izora Gala, MD;  Location: [place];  Service: ENT;  Laterality: N/A;   ESOPHAGOSCOPY N/A 04/01/2019   Procedure: ESOPHAGOSCOPY;  Surgeon: Jodi Marble, MD;  Location: [place];  Service: ENT;  Laterality: N/A;   EXCISION ORAL TUMOR Right 08/13/2020   Procedure: BIOPSY OF OROPHARYNGEAL MASS;  Surgeon: Izora Gala, MD;  Location: [place];  Service: ENT;  Laterality: Right;   EYE SURGERY Right    IR CM INJ ANY COLONIC TUBE W/FLUORO  12/20/2020   IR CM INJ ANY COLONIC TUBE W/FLUORO  03/20/2021   IR GASTROSTOMY TUBE MOD SED  09/24/2020   IR IMAGING GUIDED PORT INSERTION  09/24/2020   IR RADIOLOGIST EVAL & MGMT  07/02/2021   KNEE SURGERY     LAPAROTOMY N/A 04/12/2021   Procedure: EXPLORATORY LAPAROTOMY, REPAIR OF DUODENAL ULCER WITH Silvestre Gunner;  Surgeon: Felicie Morn, MD;  Location: [facility];  Service: General;  Laterality: N/A;   LARYNGOSCOPY AND BRONCHOSCOPY N/A 04/01/2019   Procedure: BRONCHOSCOPY;  Surgeon: Jodi Marble, MD;  Location: [place];  Service: ENT;  Laterality: N/A;   RADICAL NECK DISSECTION N/A 01/06/2020   Procedure: RADICAL NECK DISSECTION;  Surgeon: Izora Gala, MD;  Location: [facility] OR;  Service: ENT;  Laterality: N/A;    SOCIAL HISTORY: Social History   Socioeconomic History   Marital status: Single    Spouse name: Not on file   Number of children: 2   Years of education: Not on file   Highest education level: Not on file  Occupational History   Not on file  Tobacco Use   Smoking status: Former    Years: 50.00    Types: Cigarettes    Quit date: 06/01/2020    Years since quitting: 1.1   Smokeless tobacco: Never  Vaping Use   Vaping Use: Never used  Substance and Sexual Activity   Alcohol use: Yes    Alcohol/week: 4.0 standard drinks    Types: 4 Cans of beer per week    Comment: 40oz beer daily   Drug use: Yes    Types: Cocaine    Comment: none in  2 yrs   Sexual activity: Yes    Partners: Female  Other Topics Concern   Not on file  Social History Narrative   Patient is divorced with 2 children.   Patient is currently living with his sister.   Patient with a history of smoking a third of pack of cigarettes daily for 50 years.  Patient currently smoking 2 to 3 cigarettes/day.   Patient has never used smokeless tobacco.   Patient with occasional use of alcohol.   Patient last used cocaine approximately 6 months ago.  Patient denies use of marijuana.  Social Determinants of Health   Financial Resource Strain: Not on file  Food Insecurity: No Food Insecurity   Worried About Charity fundraiser in the Last Year: Never true   Ran Out of Food in the Last Year: Never true  Transportation Needs: No Transportation Needs   Lack of Transportation (Medical): No   Lack of Transportation (Non-Medical): No  Physical Activity: Not on file  Stress: Not on file  Social Connections: Unknown   Frequency of Communication with Friends and Family: Three times a week   Frequency of Social Gatherings with Friends and Family: More than three times a week   Attends Religious Services: Not on file   Active Member of Clubs or Organizations: Not on file   Attends Archivist Meetings: Not on file   Marital Status: Not on file  Intimate Partner Violence: Not on file    FAMILY HISTORY: Family History  Problem Relation Age of Onset   Breast cancer Sister    Colon cancer Brother 23       ????   Cancer Brother     ALLERGIES:  has No Known Allergies.  MEDICATIONS:  Current Outpatient Medications  Medication Sig Dispense Refill   atropine 1 % ophthalmic solution Place 1 drop into the right eye 2 (two) times a day.      lidocaine-prilocaine (EMLA) cream Apply to affected area once 30 g 3   Nutritional Supplements ([employer] STANDARD 1.4) LIQD 487 mLs by Enteral route 3 (three) times daily. 1.5 cartons (487 ml) [employer] 1.4 TID with 60  ml free water before and 60 ml free water after each TF bolus. Provide an additional 120 ml free water TID.     ondansetron (ZOFRAN) 8 MG tablet Take 1 tablet (8 mg total) by mouth 2 (two) times daily as needed for refractory nausea / vomiting. Start on day 3 after chemo. 30 tablet 1   oxyCODONE (OXY IR/ROXICODONE) 5 MG immediate release tablet Take 1 tablet (5 mg total) by mouth every 6 (six) hours as needed for severe pain. 120 tablet 0   prednisoLONE acetate (PRED FORTE) 1 % ophthalmic suspension Place 1 drop into the right eye 4 (four) times daily.     prochlorperazine (COMPAZINE) 10 MG tablet Take 1 tablet (10 mg total) by mouth every 6 (six) hours as needed (Nausea or vomiting). 30 tablet 1   Vitamin D, Ergocalciferol, (DRISDOL) 1.25 MG (50000 UNIT) CAPS capsule Take 50,000 Units by mouth every Saturday.     Water For Irrigation, Sterile (FREE WATER) SOLN Place 100 mLs into feeding tube every 4 (four) hours.     No current facility-administered medications for this visit.    REVIEW OF SYSTEMS:   Constitutional: Denies fevers, chills or abnormal night sweats Eyes: Denies blurriness of vision, double vision or watery eyes Ears, nose, mouth, throat, and face: Denies mucositis or sore throat Respiratory: Denies cough, dyspnea or wheezes Cardiovascular: Denies palpitation, chest discomfort or lower extremity swelling Gastrointestinal:  Denies nausea, heartburn or change in bowel habits Skin: Denies abnormal skin rashes Lymphatics: Denies new lymphadenopathy or easy bruising Neurological:Denies numbness, tingling or new weaknesses Behavioral/Psych: Mood is stable, no new changes  All other systems were reviewed with the patient and are negative.  PHYSICAL EXAMINATION: ECOG PERFORMANCE STATUS: 2 - Symptomatic, <50% confined to bed  Vitals:   07/23/21 1214  BP: 130/82  Pulse: 94  Resp: 18  Temp: 98.3 F (36.8 C)  SpO2: 100%   Filed  Weights   07/23/21 1214  Weight: 104 lb 3.2 oz  (47.3 kg)    GENERAL:alert, no distress and comfortable.  He looks extremely thin and cachectic SKIN: skin color, texture, turgor are normal, no rashes or significant lesions EYES: normal, conjunctiva are pink and non-injected, sclera clear OROPHARYNX:no exudate, normal lips, buccal mucosa, and tongue  NECK: Neck is fibrosed from prior surgery and radiation. LYMPH: He has palpable lymphadenopathy on his neck  LUNGS: clear to auscultation and percussion with normal breathing effort HEART: regular rate & rhythm and no murmurs without lower extremity edema ABDOMEN:abdomen soft, non-tender and normal bowel sounds.  Feeding tube site looks okay with granulation tissue around the feeding tube Musculoskeletal:no cyanosis of digits and no clubbing  PSYCH: alert & oriented x 3 with fluent speech NEURO: no focal motor/sensory deficits  LABORATORY DATA:  I have reviewed the data as listed Lab Results  Component Value Date   WBC 4.4 07/04/2021   HGB 10.4 (L) 07/04/2021   HCT 32.0 (L) 07/04/2021   MCV 91.4 07/04/2021   PLT 305 07/04/2021   Recent Labs    06/20/21 1017 06/27/21 1042 07/04/21 1025  NA 136 135 137  K 3.9 4.1 4.1  CL 101 97* 101  CO2 27 31 27   GLUCOSE 132* 108* 98  BUN 13 15 13   CREATININE 0.75 0.51* 0.59*  CALCIUM 9.5 9.5 9.6  GFRNONAA >60 >60 >60  PROT 7.4 7.4 7.4  ALBUMIN 3.5 3.8 3.6  AST 9* 12* 10*  ALT 6 11 7   ALKPHOS 52 45 50  BILITOT 0.5 0.4 0.2*    RADIOGRAPHIC STUDIES: I have reviewed multiple imaging studies with the patient and family I have personally reviewed the radiological images as listed and agreed with the findings in the report. CT Soft Tissue Neck W Contrast  Result Date: 07/19/2021 CLINICAL DATA:  Head neck cancer, assess treatment response EXAM: CT NECK WITH CONTRAST TECHNIQUE: Multidetector CT imaging of the neck was performed using the standard protocol following the bolus administration of intravenous contrast. CONTRAST:  10mL OMNIPAQUE  IOHEXOL 350 MG/ML SOLN COMPARISON:  05/09/2021 FINDINGS: Pharynx and larynx: Prominent submucosal low-density thickening attributed to radiotherapy. Ulcerated lesions at the bilateral palatine fossa, on the right with encompassed central fluid collection and measuring 7 mm. On the left there has been progressive avid enhancement measuring to 1 cm. Salivary glands: Post treatment atrophy on both sides. Thyroid: Normal Lymph nodes: Necrotic and enlarged lymph nodes in the left jugular chain have diminished in size, but are still abnormally heterogeneous and enhancing. A low left level 2 node previously measured 21 mm and is now 11 mm in maximum. Amorphous soft tissue along the right jugular chain is unchanged and hypoenhancing, suggestive of scar. Vascular: Stable. Diffuse atheromatous wall thickening of the carotids. Limited intracranial: Negative Visualized orbits: Bilateral cataract resection Mastoids and visualized paranasal sinuses: Clear Skeleton: Advanced cervical spondylosis. Upper chest: Reported on dedicated study. IMPRESSION: 1. Positive treatment response with decreased size of nodal metastases in the left jugular chain. There is likely residual active tumor in the treated nodes. 2. Ulcerated lesions in the bilateral palatine fossa, progressed on the left and suspicious for synchronous tumors. Electronically Signed   By: Jorje Guild M.D.   On: 07/19/2021 21:46   CT Chest W Contrast  Result Date: 07/19/2021 CLINICAL DATA:  Restaging head and neck cancer EXAM: CT CHEST WITH CONTRAST TECHNIQUE: Multidetector CT imaging of the chest was performed during intravenous contrast administration. CONTRAST:  52mL OMNIPAQUE  IOHEXOL 350 MG/ML SOLN COMPARISON:  PET-CT 01/31/2021. FINDINGS: Cardiovascular: Normal heart size. No pericardial effusion. Coronary artery calcifications. Mediastinum/Nodes: Normal appearance of the thyroid gland. The trachea appears patent and is midline. Normal appearance of the  esophagus. No enlarged axillary, supraclavicular, mediastinal, or hilar lymph nodes. Lungs/Pleura: Mild paraseptal and centrilobular emphysema. No pleural effusion. Within the anterior left lung base there is a central area peribronchovascular consolidation. Additionally, multifocal patchy areas of areas of ground-glass and nodular airspace densities with thickening of the associated peribronchovascular interstitium are noted. For example, within the anterior left lower lobe there is a irregular nodular density measuring 9 mm, image 100/6. In the superior segment of the left lower lobe there is a 6 mm nodule, image 77/6. A few irregular patchy nodular densities are noted within the periphery of the posteromedial right lower lobe, image 80/6 as well as in the right middle lobe. For example, within the subpleural aspect of the posteromedial right lower lobe there is a irregular nodular density measuring 1.2 cm with surrounding ground-glass attenuation, image 80/6. Within the right upper lobe there are a few focal nodular areas which appear to represent foci of mucoid impaction within dilated bronchials, image 70/6. Upper Abdomen: A gastrostomy tube is identified within the left upper quadrant of the abdomen. No acute findings noted within the imaged portions of the upper abdomen. Musculoskeletal: None IMPRESSION: 1. No highly specific findings identified to suggest pulmonary or nodal metastasis within the chest 2. Multifocal patchy areas of ground-glass and nodular airspace densities. Surrounding postinflammatory changes are noted with thickening of the peribronchovascular interstitium. Imaging findings are concerning for sequelae of aspiration and/or multifocal infection. Follow-up imaging after appropriate antibiotic therapy is recommended with repeat CT of the chest in 3 months to ensure resolution and to rule out the possibility of underlying metastatic nodule. 3. Coronary artery calcifications. 4. Aortic  Atherosclerosis (ICD10-I70.0) and Emphysema (ICD10-J43.9). Electronically Signed   By: Kerby Moors M.D.   On: 07/19/2021 13:00   IR Radiologist Eval & Mgmt  Result Date: 07/03/2021 INDICATION: Patient with history of head and neck cancer status post gastrostomy tube placement by IR on September 24, 2020. Patient presents for hypergranulation tissue around gastrostomy tube exit site. EXAM: REMOVAL TUNNELED CENTRAL VENOUS CATHETER MEDICATIONS: Lidocaine 1% 4 mL.  Silver nitrate sticks 10 in total. ANESTHESIA/SEDATION: Moderate (conscious) sedation was not employed during this procedure. FLUOROSCOPY TIME:  Fluoroscopy Time: none COMPLICATIONS: None immediate. PROCEDURE: Informed verbal consent was obtained from the patient after a thorough discussion of the procedural risks, benefits and alternatives. All questions were addressed. Maximal Sterile Barrier Technique was utilized including caps, mask, sterile gowns, sterile gloves, sterile drape, hand hygiene and skin antiseptic. A timeout was performed prior to the initiation of the procedure. The patient's gastrostomy tube exit site was prepped and draped in a normal sterile fashion. 1% lidocaine was used for local anesthesia. This silver nitrate was then applied to the affected area. The flange of the existing gastrostomy tube was then loosened for patient comfort. The patient tolerated the procedure well with no immediate complications. IMPRESSION: Pre-existing gastrostomy tube noted to be functional and in good position. Technically successful treatment for hyper granulation tissue with silver nitrate. Read by: Rushie Nyhan, NP Electronically Signed   By: Corrie Mckusick D.O.   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 Noreen is a patient with complicated history of supraglottic tumor with cancer recurrence in the head  and neck region Most recently, he underwent palliative treatment with  weekly carboplatin and paclitaxel and pembrolizumab every 21 days His recent CT imaging is independently reviewed by myself whiched show slight improved disease control He tolerated treatment well I recommend we proceed with a few more months treatment with plan to repeat imaging study again in January 2023 I will see him next month for further follow-up and toxicity review  Protein-calorie malnutrition, severe He is profoundly malnourished He has chronic dysphagia with prior evaluation by speech and language therapist who felt that the patient is at high risk of aspiration His recent CT finding is most consistent with history of aspiration pneumonia but he is currently asymptomatic I will try to get dietitian to review his case again and see we can increase his intake through his feeding tube  Anemia due to antineoplastic chemotherapy He has multifactorial anemia, likely due to his treatment He is not symptomatic We will observe his blood count carefully while on treatment  Cancer related pain This is well controlled.  I refilled his prescription pain medicine today We discussed narcotic refill policy  Dysphagia He has history of chronic dysphagia He was seen by speech and language therapist earlier this year but the patient and family wants him to be referred to be seen at [facility] for his assessment I will try to find out how we can help expedite his referral and reassess his swallowing  Orders Placed This Encounter  Procedures   CBC with Differential/Platelet    Standing Status:   Standing    Number of Occurrences:   22    Standing Expiration Date:   07/23/2022   Comprehensive metabolic panel    Standing Status:   Standing    Number of Occurrences:   33    Standing Expiration Date:   07/23/2022   Magnesium    Standing Status:   Standing    Number of Occurrences:   5    Standing Expiration Date:   07/23/2022    All questions were answered. The patient knows to call the  clinic with any problems, questions or concerns. The total time spent in the appointment was 40 minutes encounter with patients including review of chart and various tests results, discussions about plan of care and coordination of care plan   [name], MD 07/24/2021 9:15 AM

## 2021-07-24 NOTE — Assessment & Plan Note (Signed)
He has multifactorial anemia, likely due to his treatment He is not symptomatic We will observe his blood count carefully while on treatment

## 2021-07-24 NOTE — Assessment & Plan Note (Signed)
He is profoundly malnourished He has chronic dysphagia with prior evaluation by speech and language therapist who felt that the patient is at high risk of aspiration His recent CT finding is most consistent with history of aspiration pneumonia but he is currently asymptomatic I will try to get dietitian to review his case again and see we can increase his intake through his feeding tube

## 2021-07-24 NOTE — Assessment & Plan Note (Signed)
He has history of chronic dysphagia He was seen by speech and language therapist earlier this year but the patient and family wants him to be referred to be seen at [facility] for his assessment I will try to find out how we can help expedite his referral and reassess his swallowing

## 2021-07-24 NOTE — Progress Notes (Addendum)
Oncology Nurse Navigator Documentation   At Dr. Calton Dach request I called Rigoberto Noel SLP at [facility] and left a voice mail. Mr. Sawaya had indicated when he saw Dr. Alvy Bimler yesterday that he would prefer to follow with SLP at [facility]. Today I received a return call from [facility] who informed me that she had spoken with Mr. Trombetta' caregiver and arranged a Swallowing Study and follow up with her in the near future and no referral was needed at this time because he had been previously seen by her. I have made Dr. Alvy Bimler aware.   Harlow Asa RN, BSN, OCN Head & Neck Oncology Nurse [place] at [facility] Phone # [phone]  Fax # [phone]

## 2021-07-25 ENCOUNTER — Ambulatory Visit: Payer: Medicare HMO | Admitting: Hematology and Oncology

## 2021-07-25 ENCOUNTER — Inpatient Hospital Stay: Payer: Medicare HMO

## 2021-07-25 ENCOUNTER — Other Ambulatory Visit: Payer: Self-pay

## 2021-07-25 ENCOUNTER — Inpatient Hospital Stay: Payer: Medicare HMO | Admitting: Dietician

## 2021-07-25 VITALS — BP 111/76 | HR 72 | Temp 98.4°F | Resp 16 | Ht 67.0 in | Wt 106.8 lb

## 2021-07-25 DIAGNOSIS — Z95828 Presence of other vascular implants and grafts: Secondary | ICD-10-CM

## 2021-07-25 DIAGNOSIS — C321 Malignant neoplasm of supraglottis: Secondary | ICD-10-CM

## 2021-07-25 DIAGNOSIS — C090 Malignant neoplasm of tonsillar fossa: Secondary | ICD-10-CM

## 2021-07-25 DIAGNOSIS — Z5111 Encounter for antineoplastic chemotherapy: Secondary | ICD-10-CM | POA: Diagnosis not present

## 2021-07-25 LAB — COMPREHENSIVE METABOLIC PANEL
ALT: 11 U/L (ref 0–44)
AST: 15 U/L (ref 15–41)
Albumin: 3.6 g/dL (ref 3.5–5.0)
Alkaline Phosphatase: 54 U/L (ref 38–126)
Anion gap: 9 (ref 5–15)
BUN: 11 mg/dL (ref 8–23)
CO2: 29 mmol/L (ref 22–32)
Calcium: 9.1 mg/dL (ref 8.9–10.3)
Chloride: 98 mmol/L (ref 98–111)
Creatinine, Ser: 0.6 mg/dL — ABNORMAL LOW (ref 0.61–1.24)
GFR, Estimated: >60 mL/min (ref >60)
Glucose, Bld: 125 mg/dL — ABNORMAL HIGH (ref 70–99)
Potassium: 3.9 mmol/L (ref 3.5–5.1)
Sodium: 136 mmol/L (ref 135–145)
Total Bilirubin: 0.5 mg/dL (ref 0.3–1.2)
Total Protein: 7.2 g/dL (ref 6.5–8.1)

## 2021-07-25 LAB — CBC WITH DIFFERENTIAL/PLATELET
Abs Immature Granulocytes: 0.11 10*3/uL — ABNORMAL HIGH (ref 0.00–0.07)
Basophils Absolute: 0.1 10*3/uL (ref 0.0–0.1)
Basophils Relative: 1 %
Eosinophils Absolute: 0.1 10*3/uL (ref 0.0–0.5)
Eosinophils Relative: 1 %
HCT: 32.1 % — ABNORMAL LOW (ref 39.0–52.0)
Hemoglobin: 10.5 g/dL — ABNORMAL LOW (ref 13.0–17.0)
Immature Granulocytes: 1 %
Lymphocytes Relative: 14 %
Lymphs Abs: 1.1 10*3/uL (ref 0.7–4.0)
MCH: 30.3 pg (ref 26.0–34.0)
MCHC: 32.7 g/dL (ref 30.0–36.0)
MCV: 92.8 fL (ref 80.0–100.0)
Monocytes Absolute: 0.5 10*3/uL (ref 0.1–1.0)
Monocytes Relative: 6 %
Neutro Abs: 6.2 10*3/uL (ref 1.7–7.7)
Neutrophils Relative %: 77 %
Platelets: 152 10*3/uL (ref 150–400)
RBC: 3.46 MIL/uL — ABNORMAL LOW (ref 4.22–5.81)
RDW: 16.1 % — ABNORMAL HIGH (ref 11.5–15.5)
WBC: 8 10*3/uL (ref 4.0–10.5)
nRBC: 0 % (ref 0.0–0.2)

## 2021-07-25 LAB — MAGNESIUM: Magnesium: 1.5 mg/dL — ABNORMAL LOW (ref 1.7–2.4)

## 2021-07-25 MED ORDER — DIPHENHYDRAMINE HCL 50 MG/ML IJ SOLN
50.0000 mg | Freq: Once | INTRAMUSCULAR | Status: AC
  Administered 2021-07-25: 50 mg via INTRAVENOUS
  Filled 2021-07-25: qty 1

## 2021-07-25 MED ORDER — MAGNESIUM SULFATE 2 GM/50ML IV SOLN
2.0000 g | Freq: Once | INTRAVENOUS | Status: AC
  Administered 2021-07-25: 2 g via INTRAVENOUS
  Filled 2021-07-25: qty 50

## 2021-07-25 MED ORDER — COLD PACK MISC ONCOLOGY: 1.0000 | Freq: Once | Status: DC | PRN

## 2021-07-25 MED ORDER — SODIUM CHLORIDE 0.9 % IV SOLN
10.0000 mg | Freq: Once | INTRAVENOUS | Status: AC
  Administered 2021-07-25: 10 mg via INTRAVENOUS
  Filled 2021-07-25: qty 10

## 2021-07-25 MED ORDER — SODIUM CHLORIDE 0.9 % IV SOLN: Freq: Once | INTRAVENOUS | Status: AC

## 2021-07-25 MED ORDER — HEPARIN SOD (PORK) LOCK FLUSH 100 UNIT/ML IV SOLN
500.0000 [IU] | Freq: Once | INTRAVENOUS | Status: AC | PRN
  Administered 2021-07-25: 500 [IU]

## 2021-07-25 MED ORDER — PALONOSETRON HCL INJECTION 0.25 MG/5ML
0.2500 mg | Freq: Once | INTRAVENOUS | Status: AC
  Administered 2021-07-25: 0.25 mg via INTRAVENOUS
  Filled 2021-07-25: qty 5

## 2021-07-25 MED ORDER — SODIUM CHLORIDE 0.9 % IV SOLN
200.0000 mg | Freq: Once | INTRAVENOUS | Status: AC
  Administered 2021-07-25: 200 mg via INTRAVENOUS
  Filled 2021-07-25: qty 8

## 2021-07-25 MED ORDER — SODIUM CHLORIDE 0.9% FLUSH
10.0000 mL | INTRAVENOUS | Status: DC | PRN
  Administered 2021-07-25: 10 mL

## 2021-07-25 MED ORDER — SODIUM CHLORIDE 0.9 % IV SOLN
50.0000 mg/m2 | Freq: Once | INTRAVENOUS | Status: AC
  Administered 2021-07-25: 78 mg via INTRAVENOUS
  Filled 2021-07-25: qty 13

## 2021-07-25 MED ORDER — SODIUM CHLORIDE 0.9 % IV SOLN: Freq: Once | INTRAVENOUS | Status: DC

## 2021-07-25 MED ORDER — SODIUM CHLORIDE 0.9 % IV SOLN
108.4500 mg | Freq: Once | INTRAVENOUS | Status: AC
  Administered 2021-07-25: 110 mg via INTRAVENOUS
  Filled 2021-07-25: qty 11

## 2021-07-25 MED ORDER — FAMOTIDINE 20 MG IN NS 100 ML IVPB
20.0000 mg | Freq: Once | INTRAVENOUS | Status: AC
  Administered 2021-07-25: 20 mg via INTRAVENOUS
  Filled 2021-07-25: qty 100

## 2021-07-25 MED ORDER — SODIUM CHLORIDE 0.9% FLUSH
10.0000 mL | Freq: Once | INTRAVENOUS | Status: AC
  Administered 2021-07-25: 10 mL

## 2021-07-25 NOTE — Patient Instructions (Signed)
[facility]  Discharge Instructions: Thank you for choosing [facility] to provide your oncology and hematology care.   If you have a lab appointment with the [facility], please go directly to the [facility] and check in at the registration area.   Wear comfortable clothing and clothing appropriate for easy access to any Portacath or PICC line.   We strive to give you quality time with your provider. You may need to reschedule your appointment if you arrive late (15 or more minutes).  Arriving late affects you and other patients whose appointments are after yours.  Also, if you miss three or more appointments without notifying the office, you may be dismissed from the clinic at the provider's discretion.      For prescription refill requests, have your pharmacy contact our office and allow 72 hours for refills to be completed.    Today you received the following chemotherapy and/or immunotherapy agents: Pembrolizumab (Keytruda), Paclitaxel (Taxol) and Carboplatin.   To help prevent nausea and vomiting after your treatment, we encourage you to take your nausea medication as directed.  BELOW ARE SYMPTOMS THAT SHOULD BE REPORTED IMMEDIATELY: *FEVER GREATER THAN 100.4 F (38 C) OR HIGHER *CHILLS OR SWEATING *NAUSEA AND VOMITING THAT IS NOT CONTROLLED WITH YOUR NAUSEA MEDICATION *UNUSUAL SHORTNESS OF BREATH *UNUSUAL BRUISING OR BLEEDING *URINARY PROBLEMS (pain or burning when urinating, or frequent urination) *BOWEL PROBLEMS (unusual diarrhea, constipation, pain near the anus) TENDERNESS IN MOUTH AND THROAT WITH OR WITHOUT PRESENCE OF ULCERS (sore throat, sores in mouth, or a toothache) UNUSUAL RASH, SWELLING OR PAIN  UNUSUAL VAGINAL DISCHARGE OR ITCHING   Items with * indicate a potential emergency and should be followed up as soon as possible or go to the Emergency Department if any problems should occur.  Please show the CHEMOTHERAPY ALERT  CARD or IMMUNOTHERAPY ALERT CARD at check-in to the Emergency Department and triage nurse.  Should you have questions after your visit or need to cancel or reschedule your appointment, please contact [facility]  Dept: [phone]  and follow the prompts.  Office hours are 8:00 a.m. to 4:30 p.m. Monday - Friday. Please note that voicemails left after 4:00 p.m. may not be returned until the following business day.  We are closed weekends and major holidays. You have access to a nurse at all times for urgent questions. Please call the main number to the clinic Dept: [phone] and follow the prompts.   For any non-urgent questions, you may also contact your provider using MyChart. We now offer e-Visits for anyone 85 and older to request care online for non-urgent symptoms. For details visit [email or web address].   Also download the MyChart app! Go to the app store, search "MyChart", open the app, select [facility], and log in with your MyChart username and password.  Due to Covid, a mask is required upon entering the hospital/clinic. If you do not have a mask, one will be given to you upon arrival. For doctor visits, patients may have 1 support person aged 47 or older with them. For treatment visits, patients cannot have anyone with them due to current Covid guidelines and our immunocompromised population.   Hypomagnesemia Hypomagnesemia is a condition in which the level of magnesium in the blood is too low. Magnesium is a mineral that is found in many foods. It is used in many different processes in the body. Hypomagnesemia can affect every organ in the body. In severe  cases, it can cause life-threatening problems. What are the causes? This condition may be caused by: Not getting enough magnesium in your diet or not having enough healthy foods to eat (malnutrition). Problems with magnesium absorption in the intestines. Dehydration. Excessive use of  alcohol. Vomiting. Severe or long-term (chronic) diarrhea. Some medicines, including medicines that make you urinate more often (diuretics). Certain diseases, such as kidney disease, diabetes, celiac disease, and overactive thyroid. What are the signs or symptoms? Symptoms of this condition include: Loss of appetite, nausea, and vomiting. Involuntary shaking or trembling of a body part (tremor). Muscle weakness or tingling in the arms and legs. Sudden tightening of muscles (muscle spasms). Confusion. Psychiatric issues, such as: Depression and irritability. Psychosis. A feeling of fluttering of the heart (palpitations). Seizures. These symptoms are more severe if magnesium levels drop suddenly. How is this diagnosed? This condition may be diagnosed based on: Your symptoms and medical history. A physical exam. Blood and urine tests. How is this treated? Treatment depends on the cause and the severity of the condition. It may be treated by: Taking a magnesium supplement. This can be taken in pill form. If the condition is severe, magnesium is usually given through an IV. Making changes to your diet. You may be directed to eat foods that have a lot of magnesium, such as green leafy vegetables, peas, beans, and nuts. Not drinking alcohol. If you are struggling not to drink, ask your health care provider for help. Follow these instructions at home: Eating and drinking   Make sure that your diet includes foods with magnesium. Foods that have a lot of magnesium in them include: Green leafy vegetables, such as spinach and broccoli. Beans and peas. Nuts and seeds, such as almonds and sunflower seeds. Whole grains, such as whole grain bread and fortified cereals. Drink fluids that contain salts and minerals (electrolytes), such as sports drinks, when you are active. Do not drink alcohol. General instructions Take over-the-counter and prescription medicines only as told by your health  care provider. Take magnesium supplements as directed if your health care provider tells you to take them. Have your magnesium levels monitored as told by your health care provider. Keep all follow-up visits. This is important. Contact a health care provider if: You get worse instead of better. Your symptoms return. Get help right away if: You develop severe muscle weakness. You have trouble breathing. You feel that your heart is racing. These symptoms may represent a serious problem that is an emergency. Do not wait to see if the symptoms will go away. Get medical help right away. Call your local emergency services (911 in the [place] Do not drive yourself to the hospital. Summary Hypomagnesemia is a condition in which the level of magnesium in the blood is too low. Hypomagnesemia can affect every organ in the body. Treatment may include eating more foods that contain magnesium, taking magnesium supplements, and not drinking alcohol. Have your magnesium levels monitored as told by your health care provider. This information is not intended to replace advice given to you by your health care provider. Make sure you discuss any questions you have with your health care provider. Document Revised: 01/29/2021 Document Reviewed: 01/29/2021 Elsevier Patient Education  [place].  Magnesium Sulfate Injection What is this medication? MAGNESIUM SULFATE (mag NEE zee um SUL fate) prevents and treats low levels of magnesium in your body. It may also be used to prevent and treat seizures during pregnancy in people with high blood pressure  disorders, such as preeclampsia or eclampsia. Magnesium plays an important role in maintaining the health of your muscles and nervous system. This medicine may be used for other purposes; ask your health care provider or pharmacist if you have questions. What should I tell my care team before I take this medication? They need to know if you have any of these  conditions: Heart disease History of irregular heart beat Kidney disease An unusual or allergic reaction to magnesium sulfate, medications, foods, dyes, or preservatives Pregnant or trying to get pregnant Breast-feeding How should I use this medication? This medication is for infusion into a vein. It is given in a hospital or clinic setting. Talk to your care team about the use of this medication in children. While this medication may be prescribed for selected conditions, precautions do apply. Overdosage: If you think you have taken too much of this medicine contact a poison control center or emergency room at once. NOTE: This medicine is only for you. Do not share this medicine with others. What if I miss a dose? This does not apply. What may interact with this medication? Certain medications for anxiety or sleep Certain medications for seizures like phenobarbital Digoxin Medications that relax muscles for surgery Narcotic medications for pain This list may not describe all possible interactions. Give your health care provider a list of all the medicines, herbs, non-prescription drugs, or dietary supplements you use. Also tell them if you smoke, drink alcohol, or use illegal drugs. Some items may interact with your medicine. What should I watch for while using this medication? Your condition will be monitored carefully while you are receiving this medication. You may need blood work done while you are receiving this medication. What side effects may I notice from receiving this medication? Side effects that you should report to your care team as soon as possible: Allergic reactions--skin rash, itching, hives, swelling of the face, lips, tongue, or throat High magnesium level--confusion, drowsiness, facial flushing, redness, sweating, muscle weakness, fast or irregular heartbeat, trouble breathing Low blood pressure--dizziness, feeling faint or lightheaded, blurry vision Side effects  that usually do not require medical attention (report to your care team if they continue or are bothersome): Headache Nausea This list may not describe all possible side effects. Call your doctor for medical advice about side effects. You may report side effects to FDA at 1-800-FDA-1088. Where should I keep my medication? This medication is given in a hospital or clinic and will not be stored at home. NOTE: This sheet is a summary. It may not cover all possible information. If you have questions about this medicine, talk to your doctor, pharmacist, or health care provider.  2022 Elsevier/Gold Standard (2020-11-15 00:00:00)

## 2021-07-25 NOTE — Progress Notes (Signed)
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 Now with progression. He is currently receiving CarboTaxol and Keytruda.   Met with patient in waiting area. He reports overall doing well, he recently had a birthday and recalls having "too good of a time." Patient reports occasional nausea after taking morning medications. He denies constipation, diarrhea. Patient has been giving 3 cartons [employer] 1.4 and 2 (16 oz) bottles of water via tube daily. Patient reports he is tolerating formula well. He is agreeable to increase to 4 cartons/day to promote weight gain.    Medications: oxycodone, compazine, zofran  Labs: Glucose 125, Mg 1.5  Anthropometrics: Weight 106 lb 12 oz today increased from 104 lb 3.2 oz on 11/8 and 103 lb 6 oz on 10/27  10/20 - 108 lb 11.2 oz  10/13 - 108 lb 14.4 oz 9/29 - 108 lb 4 oz   NUTRITION DIAGNOSIS: Unintended weight loss stable   MALNUTRITION DIAGNOSIS: Severe malnutrition continues   INTERVENTION:  Patient agreeable to increase tube feedings to promote weight gain Patient will give 1 (325 ml) carton [employer] 1.4 with 120 ml water flush QID. Give additional 2 cups water daily via tube.This provides 1820 kcal, 80 grams protein, 1890 ml total water Patient reports having adequate formula and supplies  MONITORING, EVALUATION, GOAL: weight trends, tube feedings   NEXT VISIT: Tuesday, December 6 during infusion

## 2021-07-29 ENCOUNTER — Other Ambulatory Visit: Payer: Self-pay

## 2021-07-29 ENCOUNTER — Inpatient Hospital Stay: Payer: Medicare HMO

## 2021-07-29 ENCOUNTER — Telehealth: Payer: Self-pay | Admitting: Internal Medicine

## 2021-07-29 DIAGNOSIS — Z95828 Presence of other vascular implants and grafts: Secondary | ICD-10-CM

## 2021-07-29 DIAGNOSIS — C090 Malignant neoplasm of tonsillar fossa: Secondary | ICD-10-CM

## 2021-07-29 DIAGNOSIS — C321 Malignant neoplasm of supraglottis: Secondary | ICD-10-CM

## 2021-07-29 DIAGNOSIS — Z5111 Encounter for antineoplastic chemotherapy: Secondary | ICD-10-CM | POA: Diagnosis not present

## 2021-07-29 LAB — COMPREHENSIVE METABOLIC PANEL
ALT: 7 U/L (ref 0–44)
AST: 14 U/L — ABNORMAL LOW (ref 15–41)
Albumin: 3.4 g/dL — ABNORMAL LOW (ref 3.5–5.0)
Alkaline Phosphatase: 49 U/L (ref 38–126)
Anion gap: 9 (ref 5–15)
BUN: 18 mg/dL (ref 8–23)
CO2: 28 mmol/L (ref 22–32)
Calcium: 9 mg/dL (ref 8.9–10.3)
Chloride: 98 mmol/L (ref 98–111)
Creatinine, Ser: 0.64 mg/dL (ref 0.61–1.24)
GFR, Estimated: >60 mL/min (ref >60)
Glucose, Bld: 144 mg/dL — ABNORMAL HIGH (ref 70–99)
Potassium: 4.2 mmol/L (ref 3.5–5.1)
Sodium: 135 mmol/L (ref 135–145)
Total Bilirubin: 0.7 mg/dL (ref 0.3–1.2)
Total Protein: 7.1 g/dL (ref 6.5–8.1)

## 2021-07-29 LAB — CBC WITH DIFFERENTIAL/PLATELET
Abs Immature Granulocytes: 0.04 10*3/uL (ref 0.00–0.07)
Basophils Absolute: 0 10*3/uL (ref 0.0–0.1)
Basophils Relative: 1 %
Eosinophils Absolute: 0.1 10*3/uL (ref 0.0–0.5)
Eosinophils Relative: 3 %
HCT: 30.3 % — ABNORMAL LOW (ref 39.0–52.0)
Hemoglobin: 9.7 g/dL — ABNORMAL LOW (ref 13.0–17.0)
Immature Granulocytes: 1 %
Lymphocytes Relative: 18 %
Lymphs Abs: 0.6 10*3/uL — ABNORMAL LOW (ref 0.7–4.0)
MCH: 30 pg (ref 26.0–34.0)
MCHC: 32 g/dL (ref 30.0–36.0)
MCV: 93.8 fL (ref 80.0–100.0)
Monocytes Absolute: 0.2 10*3/uL (ref 0.1–1.0)
Monocytes Relative: 6 %
Neutro Abs: 2.4 10*3/uL (ref 1.7–7.7)
Neutrophils Relative %: 71 %
Platelets: 158 10*3/uL (ref 150–400)
RBC: 3.23 MIL/uL — ABNORMAL LOW (ref 4.22–5.81)
RDW: 15.9 % — ABNORMAL HIGH (ref 11.5–15.5)
WBC: 3.4 10*3/uL — ABNORMAL LOW (ref 4.0–10.5)
nRBC: 0 % (ref 0.0–0.2)

## 2021-07-29 LAB — TSH: TSH: 1.707 u[IU]/mL (ref 0.320–4.118)

## 2021-07-29 LAB — MAGNESIUM: Magnesium: 1.6 mg/dL — ABNORMAL LOW (ref 1.7–2.4)

## 2021-07-29 MED ORDER — SODIUM CHLORIDE 0.9% FLUSH
10.0000 mL | Freq: Once | INTRAVENOUS | Status: AC
  Administered 2021-07-29: 10 mL

## 2021-07-29 NOTE — Patient Instructions (Signed)
[facility]  Discharge Instructions: Thank you for choosing [facility] to provide your oncology and hematology care.   If you have a lab appointment with the Cancer Center, please go directly to the Cancer Center and check in at the registration area.   Wear comfortable clothing and clothing appropriate for easy access to any Portacath or PICC line.   We strive to give you quality time with your provider. You may need to reschedule your appointment if you arrive late (15 or more minutes).  Arriving late affects you and other patients whose appointments are after yours.  Also, if you miss three or more appointments without notifying the office, you may be dismissed from the clinic at the provider's discretion.      For prescription refill requests, have your pharmacy contact our office and allow 72 hours for refills to be completed.    Today you received the following chemotherapy and/or immunotherapy agents Carboplatin and Taxol      To help prevent nausea and vomiting after your treatment, we encourage you to take your nausea medication as directed.  BELOW ARE SYMPTOMS THAT SHOULD BE REPORTED IMMEDIATELY: *FEVER GREATER THAN 100.4 F (38 C) OR HIGHER *CHILLS OR SWEATING *NAUSEA AND VOMITING THAT IS NOT CONTROLLED WITH YOUR NAUSEA MEDICATION *UNUSUAL SHORTNESS OF BREATH *UNUSUAL BRUISING OR BLEEDING *URINARY PROBLEMS (pain or burning when urinating, or frequent urination) *BOWEL PROBLEMS (unusual diarrhea, constipation, pain near the anus) TENDERNESS IN MOUTH AND THROAT WITH OR WITHOUT PRESENCE OF ULCERS (sore throat, sores in mouth, or a toothache) UNUSUAL RASH, SWELLING OR PAIN  UNUSUAL VAGINAL DISCHARGE OR ITCHING   Items with * indicate a potential emergency and should be followed up as soon as possible or go to the Emergency Department if any problems should occur.  Please show the CHEMOTHERAPY ALERT CARD or IMMUNOTHERAPY ALERT CARD at  check-in to the Emergency Department and triage nurse.  Should you have questions after your visit or need to cancel or reschedule your appointment, please contact [facility]  Dept: [phone]  and follow the prompts.  Office hours are 8:00 a.m. to 4:30 p.m. Monday - Friday. Please note that voicemails left after 4:00 p.m. may not be returned until the following business day.  We are closed weekends and major holidays. You have access to a nurse at all times for urgent questions. Please call the main number to the clinic Dept: [phone] and follow the prompts.   For any non-urgent questions, you may also contact your provider using MyChart. We now offer e-Visits for anyone 18 and older to request care online for non-urgent symptoms. For details visit [email or web address].   Also download the MyChart app! Go to the app store, search "MyChart", open the app, select [facility], and log in with your MyChart username and password.  Due to Covid, a mask is required upon entering the hospital/clinic. If you do not have a mask, one will be given to you upon arrival. For doctor visits, patients may have 1 support person aged 18 or older with them. For treatment visits, patients cannot have anyone with them due to current Covid guidelines and our immunocompromised population.   

## 2021-07-29 NOTE — Telephone Encounter (Signed)
disregard

## 2021-07-29 NOTE — Progress Notes (Signed)
Treatment not given per MD order.  Too early.  Mediport flushed and deaccessed by Barnetta Chapel, RN.  Calendar given to patient.  Explained to patient he is not going to get treatment this week.  His next treatment is 11/21 per MD.  Family member updated via telephone per pt request.

## 2021-07-30 ENCOUNTER — Other Ambulatory Visit: Payer: Self-pay | Admitting: Hematology and Oncology

## 2021-08-02 MED FILL — Dexamethasone Sodium Phosphate Inj 100 MG/10ML: INTRAMUSCULAR | Qty: 1 | Status: AC

## 2021-08-05 ENCOUNTER — Other Ambulatory Visit: Payer: Self-pay | Admitting: Hematology and Oncology

## 2021-08-05 ENCOUNTER — Other Ambulatory Visit: Payer: Self-pay

## 2021-08-05 ENCOUNTER — Inpatient Hospital Stay: Payer: Medicare HMO

## 2021-08-05 VITALS — BP 128/77 | HR 72 | Temp 98.4°F | Resp 18 | Wt 107.5 lb

## 2021-08-05 DIAGNOSIS — C321 Malignant neoplasm of supraglottis: Secondary | ICD-10-CM

## 2021-08-05 DIAGNOSIS — Z5111 Encounter for antineoplastic chemotherapy: Secondary | ICD-10-CM | POA: Diagnosis not present

## 2021-08-05 DIAGNOSIS — C090 Malignant neoplasm of tonsillar fossa: Secondary | ICD-10-CM

## 2021-08-05 DIAGNOSIS — Z95828 Presence of other vascular implants and grafts: Secondary | ICD-10-CM

## 2021-08-05 LAB — COMPREHENSIVE METABOLIC PANEL
ALT: 11 U/L (ref 0–44)
AST: 16 U/L (ref 15–41)
Albumin: 3.6 g/dL (ref 3.5–5.0)
Alkaline Phosphatase: 50 U/L (ref 38–126)
Anion gap: 9 (ref 5–15)
BUN: 5 mg/dL — ABNORMAL LOW (ref 8–23)
CO2: 29 mmol/L (ref 22–32)
Calcium: 9 mg/dL (ref 8.9–10.3)
Chloride: 99 mmol/L (ref 98–111)
Creatinine, Ser: 0.59 mg/dL — ABNORMAL LOW (ref 0.61–1.24)
GFR, Estimated: >60 mL/min (ref >60)
Glucose, Bld: 85 mg/dL (ref 70–99)
Potassium: 3.7 mmol/L (ref 3.5–5.1)
Sodium: 137 mmol/L (ref 135–145)
Total Bilirubin: 0.4 mg/dL (ref 0.3–1.2)
Total Protein: 7.1 g/dL (ref 6.5–8.1)

## 2021-08-05 LAB — CBC WITH DIFFERENTIAL/PLATELET
Abs Immature Granulocytes: 0.02 10*3/uL (ref 0.00–0.07)
Basophils Absolute: 0 10*3/uL (ref 0.0–0.1)
Basophils Relative: 1 %
Eosinophils Absolute: 0.1 10*3/uL (ref 0.0–0.5)
Eosinophils Relative: 3 %
HCT: 29.3 % — ABNORMAL LOW (ref 39.0–52.0)
Hemoglobin: 9.4 g/dL — ABNORMAL LOW (ref 13.0–17.0)
Immature Granulocytes: 1 %
Lymphocytes Relative: 26 %
Lymphs Abs: 0.7 10*3/uL (ref 0.7–4.0)
MCH: 30.2 pg (ref 26.0–34.0)
MCHC: 32.1 g/dL (ref 30.0–36.0)
MCV: 94.2 fL (ref 80.0–100.0)
Monocytes Absolute: 0.4 10*3/uL (ref 0.1–1.0)
Monocytes Relative: 14 %
Neutro Abs: 1.5 10*3/uL — ABNORMAL LOW (ref 1.7–7.7)
Neutrophils Relative %: 55 %
Platelets: 214 10*3/uL (ref 150–400)
RBC: 3.11 MIL/uL — ABNORMAL LOW (ref 4.22–5.81)
RDW: 16.2 % — ABNORMAL HIGH (ref 11.5–15.5)
WBC: 2.7 10*3/uL — ABNORMAL LOW (ref 4.0–10.5)
nRBC: 0 % (ref 0.0–0.2)

## 2021-08-05 LAB — TSH: TSH: 1.515 u[IU]/mL (ref 0.320–4.118)

## 2021-08-05 LAB — MAGNESIUM: Magnesium: 1.6 mg/dL — ABNORMAL LOW (ref 1.7–2.4)

## 2021-08-05 MED ORDER — SODIUM CHLORIDE 0.9 % IV SOLN
50.0000 mg/m2 | Freq: Once | INTRAVENOUS | Status: AC
  Administered 2021-08-05: 78 mg via INTRAVENOUS
  Filled 2021-08-05: qty 13

## 2021-08-05 MED ORDER — MAGNESIUM OXIDE -MG SUPPLEMENT 400 (240 MG) MG PO TABS: 400.0000 mg | ORAL_TABLET | Freq: Every day | ORAL | 1 refills | Status: AC

## 2021-08-05 MED ORDER — PALONOSETRON HCL INJECTION 0.25 MG/5ML
0.2500 mg | Freq: Once | INTRAVENOUS | Status: AC
  Administered 2021-08-05: 0.25 mg via INTRAVENOUS
  Filled 2021-08-05: qty 5

## 2021-08-05 MED ORDER — DEXAMETHASONE SODIUM PHOSPHATE 100 MG/10ML IJ SOLN
10.0000 mg | Freq: Once | INTRAMUSCULAR | Status: AC
  Administered 2021-08-05: 10 mg via INTRAVENOUS
  Filled 2021-08-05: qty 10

## 2021-08-05 MED ORDER — FAMOTIDINE 20 MG IN NS 100 ML IVPB
20.0000 mg | Freq: Once | INTRAVENOUS | Status: AC
  Administered 2021-08-05: 20 mg via INTRAVENOUS
  Filled 2021-08-05: qty 100

## 2021-08-05 MED ORDER — DIPHENHYDRAMINE HCL 50 MG/ML IJ SOLN
50.0000 mg | Freq: Once | INTRAMUSCULAR | Status: AC
  Administered 2021-08-05: 50 mg via INTRAVENOUS
  Filled 2021-08-05: qty 1

## 2021-08-05 MED ORDER — SODIUM CHLORIDE 0.9 % IV SOLN
Freq: Once | INTRAVENOUS | Status: AC
Start: 2021-08-05 — End: 2021-08-05

## 2021-08-05 MED ORDER — SODIUM CHLORIDE 0.9 % IV SOLN
111.0000 mg | Freq: Once | INTRAVENOUS | Status: AC
  Administered 2021-08-05: 110 mg via INTRAVENOUS
  Filled 2021-08-05: qty 11

## 2021-08-05 MED ORDER — SODIUM CHLORIDE 0.9% FLUSH
10.0000 mL | INTRAVENOUS | Status: DC | PRN
  Administered 2021-08-05: 10 mL

## 2021-08-05 MED ORDER — SODIUM CHLORIDE 0.9% FLUSH: 10.0000 mL | Freq: Once | INTRAVENOUS | Status: DC

## 2021-08-05 MED ORDER — HEPARIN SOD (PORK) LOCK FLUSH 100 UNIT/ML IV SOLN
500.0000 [IU] | Freq: Once | INTRAVENOUS | Status: AC | PRN
  Administered 2021-08-05: 500 [IU]

## 2021-08-05 NOTE — Progress Notes (Signed)
Per Dr. Alvy Bimler - okay to proceed with low ANC.

## 2021-08-09 MED FILL — Dexamethasone Sodium Phosphate Inj 100 MG/10ML: INTRAMUSCULAR | Qty: 1 | Status: AC

## 2021-08-12 ENCOUNTER — Other Ambulatory Visit: Payer: Self-pay

## 2021-08-12 ENCOUNTER — Inpatient Hospital Stay: Payer: Medicare HMO

## 2021-08-12 ENCOUNTER — Ambulatory Visit (HOSPITAL_BASED_OUTPATIENT_CLINIC_OR_DEPARTMENT_OTHER): Payer: Medicare HMO | Admitting: Physician Assistant

## 2021-08-12 VITALS — BP 128/73 | HR 89 | Temp 100.0°F | Resp 20 | Wt 101.5 lb

## 2021-08-12 DIAGNOSIS — Z95828 Presence of other vascular implants and grafts: Secondary | ICD-10-CM

## 2021-08-12 DIAGNOSIS — Z5111 Encounter for antineoplastic chemotherapy: Secondary | ICD-10-CM | POA: Diagnosis not present

## 2021-08-12 DIAGNOSIS — R051 Acute cough: Secondary | ICD-10-CM | POA: Diagnosis not present

## 2021-08-12 DIAGNOSIS — B370 Candidal stomatitis: Secondary | ICD-10-CM | POA: Diagnosis not present

## 2021-08-12 DIAGNOSIS — C321 Malignant neoplasm of supraglottis: Secondary | ICD-10-CM | POA: Diagnosis not present

## 2021-08-12 DIAGNOSIS — R509 Fever, unspecified: Secondary | ICD-10-CM

## 2021-08-12 DIAGNOSIS — C090 Malignant neoplasm of tonsillar fossa: Secondary | ICD-10-CM

## 2021-08-12 LAB — CBC WITH DIFFERENTIAL/PLATELET
Abs Immature Granulocytes: 0.02 10*3/uL (ref 0.00–0.07)
Basophils Absolute: 0 10*3/uL (ref 0.0–0.1)
Basophils Relative: 0 %
Eosinophils Absolute: 0 10*3/uL (ref 0.0–0.5)
Eosinophils Relative: 0 %
HCT: 30.8 % — ABNORMAL LOW (ref 39.0–52.0)
Hemoglobin: 10.2 g/dL — ABNORMAL LOW (ref 13.0–17.0)
Immature Granulocytes: 1 %
Lymphocytes Relative: 16 %
Lymphs Abs: 0.6 10*3/uL — ABNORMAL LOW (ref 0.7–4.0)
MCH: 30.9 pg (ref 26.0–34.0)
MCHC: 33.1 g/dL (ref 30.0–36.0)
MCV: 93.3 fL (ref 80.0–100.0)
Monocytes Absolute: 0.9 10*3/uL (ref 0.1–1.0)
Monocytes Relative: 23 %
Neutro Abs: 2.5 10*3/uL (ref 1.7–7.7)
Neutrophils Relative %: 60 %
Platelets: 197 10*3/uL (ref 150–400)
RBC: 3.3 MIL/uL — ABNORMAL LOW (ref 4.22–5.81)
RDW: 15.9 % — ABNORMAL HIGH (ref 11.5–15.5)
WBC: 4.1 10*3/uL (ref 4.0–10.5)
nRBC: 0 % (ref 0.0–0.2)

## 2021-08-12 LAB — COMPREHENSIVE METABOLIC PANEL
ALT: 11 U/L (ref 0–44)
AST: 18 U/L (ref 15–41)
Albumin: 3.3 g/dL — ABNORMAL LOW (ref 3.5–5.0)
Alkaline Phosphatase: 41 U/L (ref 38–126)
Anion gap: 10 (ref 5–15)
BUN: 18 mg/dL (ref 8–23)
CO2: 28 mmol/L (ref 22–32)
Calcium: 8.8 mg/dL — ABNORMAL LOW (ref 8.9–10.3)
Chloride: 96 mmol/L — ABNORMAL LOW (ref 98–111)
Creatinine, Ser: 0.7 mg/dL (ref 0.61–1.24)
GFR, Estimated: >60 mL/min (ref >60)
Glucose, Bld: 133 mg/dL — ABNORMAL HIGH (ref 70–99)
Potassium: 4.3 mmol/L (ref 3.5–5.1)
Sodium: 134 mmol/L — ABNORMAL LOW (ref 135–145)
Total Bilirubin: 0.3 mg/dL (ref 0.3–1.2)
Total Protein: 7.4 g/dL (ref 6.5–8.1)

## 2021-08-12 LAB — TSH: TSH: 0.57 u[IU]/mL (ref 0.320–4.118)

## 2021-08-12 LAB — MAGNESIUM: Magnesium: 1.5 mg/dL — ABNORMAL LOW (ref 1.7–2.4)

## 2021-08-12 MED ORDER — SODIUM CHLORIDE 0.9% FLUSH
10.0000 mL | Freq: Once | INTRAVENOUS | Status: AC
  Administered 2021-08-12: 14:00:00 10 mL

## 2021-08-12 MED ORDER — SODIUM CHLORIDE 0.9% FLUSH: 10.0000 mL | Freq: Once | INTRAVENOUS | Status: DC

## 2021-08-12 MED ORDER — NYSTATIN 100000 UNIT/ML MT SUSP: 5.0000 mL | Freq: Four times a day (QID) | OROMUCOSAL | 0 refills | Status: DC

## 2021-08-12 MED ORDER — HEPARIN SOD (PORK) LOCK FLUSH 100 UNIT/ML IV SOLN: 500.0000 [IU] | Freq: Once | INTRAVENOUS | Status: DC

## 2021-08-12 MED ORDER — ACETAMINOPHEN 325 MG PO TABS
650.0000 mg | ORAL_TABLET | Freq: Once | ORAL | Status: AC
  Administered 2021-08-12: 16:00:00 650 mg via ORAL
  Filled 2021-08-12: qty 2

## 2021-08-12 NOTE — Progress Notes (Signed)
Symptom Management Consult note [place]    Patient Care Team: [name], MD as PCP - General (Internal Medicine) Izora Gala, MD as Consulting Physician (Otolaryngology) [name], MD as Attending Physician (Radiation Oncology) Leota Sauers, RN (Inactive) as Oncology Nurse Navigator Karie Mainland, RD as Dietitian (Nutrition) Valentino Saxon [place], [place] as Speech Language Pathologist (Speech Pathology) Kennith Center, LCSW as Social Worker [name], MD as Consulting Physician (Hematology and Oncology) Mal[place]elt, Stephani Police, RN as Oncology Nurse Navigator    Name of the [name]  [id number]  1951-05-18   Date of visit: 08/12/2021    Chief complaint/ Reason for visit- cough  Oncology History Overview Note  Recurrent SCC, p16 neg   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 SEE NOTE   04/01/2019 Surgery   Pre-Op Dx:   right supraglottic carcinoma   Post-op Dx: T2 N0 M0 (stage II) right supraglottic carcinoma   Proc: Direct laryngoscopy with biopsy.  Cervical esophagoscopy.  Bronchoscopy.   Surg:  [name]    Findings: A bulky necrotic and semi-pedunculated tumor coming off the lateral surface of the right aryepiglottic fold and the anterior and lateral aspect of the piriform sinus on the right side.  Airway was compromised by the tumor mass at intubation.   04/11/2019 Imaging   CT neck: IMPRESSION: The glottis is closed with suboptimal evaluation of the vocal cords. No asymmetry of the cords. Correlate with  laryngoscopy results.   No enlarged lymph nodes in the neck. 7 mm right level 2 lymph node may be reactive. No definite pathologic lymph nodes in the neck.   04/11/2019 Imaging   CT neck  This CT was performed after surgical debulking of an exophytic laryngeal tumor.   Most apparent on series 2, image 51 there is abnormal asymmetric hyperenhancement along the surface of the right piriform sinus, and perhaps also involving the bilateral AE folds.   04/22/2019 Imaging   PET: IMPRESSION: 1. Mild residual activity in the posterior RIGHT hypopharynx confined to the mucosa. 2. No evidence of hypermetabolic metastatic lymph nodes in LEFT or RIGHT neck. 3. No evidence distant metastatic disease.   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 New FDG avid right level 2 cervical lymph node concerning for recurrent disease. No findings of distant metastatic disease within the chest, abdomen or pelvis. 2. Mild nonspecific increased uptake in the right side of larynx is similar to previous exam. 3. Aortic Atherosclerosis (ICD10-I70.0). Coronary artery calcifications.    11/21/2019 Pathology Results   SURGICAL PATHOLOGY  CASE: [id number]  [name]  Surgical Pathology Report   FINAL MICROSCOPIC DIAGNOSIS:   A. LYMPH NODE, RIGHT CERVICAL:  - Poorly differentiated carcinoma consistent with metastatic squamous cell carcinoma.  - See comment   01/06/2020 Pathology Results   SURGICAL PATHOLOGY  CASE:  [id number]  [name]  Surgical Pathology Report   Clinical History: metastatic cancer to cervical lymph nodes (cm)   FINAL MICROSCOPIC DIAGNOSIS:   A. LYMPH NODE, RIGHT MODIFIED, DISSECTION:  - Metastatic squamous cell carcinoma in four of nineteen lymph nodes (4/19).  - Largest metastatic nodule is 3.1 cm with 5 mm extra-nodal extension.  - Lymphovascular space involvement in peri-nodal connective tissue.  - See comment.    01/06/2020 Surgery   PRE-OPERATIVE DIAGNOSIS:  METASTATIC CANCER TO CERVICAL LYMPH NODES   POST-OPERATIVE DIAGNOSIS:  METASTATIC CANCER TO CERVICAL LYMPH NODES   PROCEDURE:  Procedure(s): DIRECT LARYNGOSCOPY MODIFIED RADICAL NECK DISSECTION   SURGEON:  [name], MD     SPECIMEN: Right modified neck dissection including levels 2 and 3, including the internal jugular vein, suture marks the inferior jugular vein stump.   07/05/2020 Imaging   1. Sequelae of radiation and right neck dissection from the previous contrasted Neck CT.   2. Asymmetric soft tissue thickening and enhancement along the right lateral pharynx. Although this might be asymmetric mucositis, the appearance is suspicious and recommend direct inspection. NI-RADS category 2a.   3. Suspicious small 6 mm hyperenhancing nodular soft tissue along the posterior margin of the neck dissection at right level 3. NI-RADS category 2 vs 3 - although this is likely too small for imaging guided biopsy. Repeat PET-CT may be most valuable.   4. Mild inflammatory appearing right upper lobe centrilobular ground-glass opacity, new since February. Consider mild or developing right upper lobe infection. Post radiation changes to the lung apices.       08/13/2020 Pathology Results   A. OROPHARNYX, BIOPSY:  - Poorly differentiated squamous cell carcinoma.  - See comment.    08/13/2020 Surgery   POST-OPERATIVE DIAGNOSIS:  Tonsillary Mass History of Laryngeal  Cancer   PROCEDURE:   Procedure(s): DIRECT LARYNGOSCOPY BIOPSY OF OROPHARYNGEAL MASS   08/30/2020 PET scan   1. Recurrence of head neck carcinoma with a broad hypermetabolic pharyngeal mucosal lesion involving the RIGHT lateral posterior oropharynx and hypopharynx extending from the palatine tonsil to the vallecula. 2. Hypermetabolic lymph node posterior sternocleidomastoid muscle on the RIGHT (level 3). 3. Hypermetabolic nodule within the LEFT parotid glands favored small primary parotid neoplasm. 4. No evidence thoracic metastasis   09/24/2020 - 10/30/2020 Chemotherapy   He received weekly cisplatin   01/31/2021 PET scan   Findings of worsening nodal disease with a node or group of nodes in the LEFT neck and a RIGHT supraclavicular lymph node as discussed.   Post treatment changes with soft tissue fullness in the area of the prior tumor but with markedly diminished metabolic activity.     03/11/2021 Imaging   Ct neck  Diffuse pharyngeal and laryngeal mucosal and submucosal edema probably related to acute radiation injury. The airway is narrow and could possibly be critically narrowed. I do not see any evidence of recurrent mucosal or submucosal tumor identifiable.   Similar appearance of the recurrent malignant lymphadenopathy on the left at level 2 and level 3 and on the right in the low supraclavicular region just above and behind the clavicular head.   04/12/2021 Imaging   Ct abdomen and pelvis  1. Contrast material in the peritoneal cavity of the upper  abdomen suggesting leak along the gastrostomy tract. 2. Gastrostomy tube is appropriately positioned. No evidence of abscess. 3. 1.3 cm right middle lobe nodule, new since 01/31/2021. Favor infectious/inflammatory over primary/secondary neoplasm given the relatively short time frame of development.   04/12/2021 Surgery   Postoperative Diagnosis: Perforated duodenal ulcer (1st portion)   Surgical Procedure:  Modified Graham patch of duodenal  ulcer Gastrostomy tube exchange   Operative Team Members:  Surgeon(s) and Role:    * Stechschulte, Nickola Major, MD - Primary     Drains:  (19 Fr) Jackson-Pratt drain(s) with closed bulb suction in the right upper quadrant near the duodenal ulcer repair and Gastrostomy Tube 26 fr   05/09/2021 Imaging   1. Progression of lesion in the right tonsil with central fluid collection and peripheral enhancement. This was a site of prior recurrent sick could represent progressive tumor or abscess. Continued follow-up recommended. 2. Progressive malignant lymphadenopathy in the left neck. Stable lymphadenopathy right supraclavicular region. 3. New area of stranding in the superior segment left lower lobe but certain etiology. Attention on follow-up recommended.     06/13/2021 -  Chemotherapy   Patient is on Treatment Plan :  Paclitaxel + Carboplatin q7d     06/13/2021 -  Chemotherapy   Patient is on Treatment Plan : HEAD/NECK Pembrolizumab Q21D     07/18/2021 Imaging   Ct chest 1. No highly specific findings identified to suggest pulmonary or nodal metastasis within the chest 2. Multifocal patchy areas of ground-glass and nodular airspace densities. Surrounding postinflammatory changes are noted with thickening of the peribronchovascular interstitium. Imaging findings are concerning for sequelae of aspiration and/or multifocal infection. Follow-up imaging after appropriate antibiotic therapy is recommended with repeat CT of the chest in 3 months to ensure resolution and to rule out the possibility of underlying metastatic nodule. 3. Coronary artery calcifications. 4. Aortic Atherosclerosis (ICD10-I70.0) and Emphysema (ICD10-J43.9).   07/18/2021 Imaging   CT neck  1. Positive treatment response with decreased size of nodal metastases in the left jugular chain. There is likely residual active tumor in the treated nodes. 2. Ulcerated lesions in the bilateral palatine fossa, progressed on the left and  suspicious for synchronous tumors.   07/23/2021 Cancer Staging   Staging form: Larynx - Supraglottis, AJCC 8th Edition - Clinical stage from 07/23/2021: Stage IVA (rcT2, cN2b, cM0) - Signed by [name], MD on 07/23/2021 Stage prefix: Recurrence      Current Therapy: carboplatin and paclitaxel with last treatment 08/05/21  Interval history- [name] is a 70 yo male with history as stated above seen in infusion center today for cough. Patient states he has had productive cough x 2 days. Phlegm is yellow. He denies any sick contacts. He states his throat his also sore. Pain is intermittent. He is still tolerating feeds through feeding tube. He has history of chronic dysphagia and aspiration with plan is for him to be evaluated for this at [facility] per patient's choice. No OTC medications prior to arrival. Patient unsure if he is having body aches, just states the he feels "bad." Denies fever, cough, congestion, shortness of breath, chest pain, abdominal pain, nausea, emesis, diarrhea, rash.     ROS  All other systems are reviewed and are negative for acute change except as noted in the HPI.    No Known Allergies   Past Medical History:  Diagnosis Date   Cancer ([place])    Throat cancer 2019   ETOH abuse    Frequent urination  Glaucoma    Hepatitis C virus infection without hepatic coma    dx'ed in 11/2018   History of radiation therapy 05/12/19- 07/06/19   Larynx   Hypertension    Wears denture    upper only; lost lower denture     Past Surgical History:  Procedure Laterality Date   ANKLE SURGERY  2011   right ankle   COLONOSCOPY  02/2019   polyps - Dr Havery Moros   DIRECT LARYNGOSCOPY N/A 04/01/2019   Procedure: DIRECT LARYNGOSCOPY WITH BIOPSY;  Surgeon: Jodi Marble, MD;  Location: [facility] OR;  Service: ENT;  Laterality: N/A;   DIRECT LARYNGOSCOPY N/A 01/06/2020   Procedure: DIRECT LARYNGOSCOPY;  Surgeon: Izora Gala, MD;  Location: [facility] OR;  Service: ENT;   Laterality: N/A;   DIRECT LARYNGOSCOPY N/A 08/13/2020   Procedure: DIRECT LARYNGOSCOPY;  Surgeon: Izora Gala, MD;  Location: [place];  Service: ENT;  Laterality: N/A;   ESOPHAGOSCOPY N/A 04/01/2019   Procedure: ESOPHAGOSCOPY;  Surgeon: Jodi Marble, MD;  Location: [place];  Service: ENT;  Laterality: N/A;   EXCISION ORAL TUMOR Right 08/13/2020   Procedure: BIOPSY OF OROPHARYNGEAL MASS;  Surgeon: Izora Gala, MD;  Location: [place];  Service: ENT;  Laterality: Right;   EYE SURGERY Right    IR CM INJ ANY COLONIC TUBE W/FLUORO  12/20/2020   IR CM INJ ANY COLONIC TUBE W/FLUORO  03/20/2021   IR GASTROSTOMY TUBE MOD SED  09/24/2020   IR IMAGING GUIDED PORT INSERTION  09/24/2020   IR RADIOLOGIST EVAL & MGMT  07/02/2021   KNEE SURGERY     LAPAROTOMY N/A 04/12/2021   Procedure: EXPLORATORY LAPAROTOMY, REPAIR OF DUODENAL ULCER WITH Silvestre Gunner;  Surgeon: Felicie Morn, MD;  Location: [facility] ORS;  Service: General;  Laterality: N/A;   LARYNGOSCOPY AND BRONCHOSCOPY N/A 04/01/2019   Procedure: BRONCHOSCOPY;  Surgeon: Jodi Marble, MD;  Location: [place];  Service: ENT;  Laterality: N/A;   RADICAL NECK DISSECTION N/A 01/06/2020   Procedure: RADICAL NECK DISSECTION;  Surgeon: Izora Gala, MD;  Location: [facility] OR;  Service: ENT;  Laterality: N/A;    Social History   Socioeconomic History   Marital status: Single    Spouse name: Not on file   Number of children: 2   Years of education: Not on file   Highest education level: Not on file  Occupational History   Not on file  Tobacco Use   Smoking status: Former    Years: 50.00    Types: Cigarettes    Quit date: 06/01/2020    Years since quitting: 1.1   Smokeless tobacco: Never  Vaping Use   Vaping Use: Never used  Substance and Sexual Activity   Alcohol use: Yes    Alcohol/week: 4.0 standard drinks    Types: 4 Cans of beer per week    Comment: 40oz beer daily   Drug use: Yes    Types: Cocaine    Comment: none  in 2 yrs   Sexual activity: Yes    Partners: Female  Other Topics Concern   Not on file  Social History Narrative   Patient is divorced with 2 children.   Patient is currently living with his sister.   Patient with a history of smoking a third of pack of cigarettes daily for 50 years.  Patient currently smoking 2 to 3 cigarettes/day.   Patient has never used smokeless tobacco.   Patient with occasional use of alcohol.   Patient last used cocaine  approximately 6 months ago.  Patient denies use of marijuana.   Social Determinants of Health   Financial Resource Strain: Not on file  Food Insecurity: No Food Insecurity   Worried About Charity fundraiser in the Last Year: Never true   Ran Out of Food in the Last Year: Never true  Transportation Needs: No Transportation Needs   Lack of Transportation (Medical): No   Lack of Transportation (Non-Medical): No  Physical Activity: Not on file  Stress: Not on file  Social Connections: Unknown   Frequency of Communication with Friends and Family: Three times a week   Frequency of Social Gatherings with Friends and Family: More than three times a week   Attends Religious Services: Not on file   Active Member of Clubs or Organizations: Not on file   Attends Archivist Meetings: Not on file   Marital Status: Not on file  Intimate Partner Violence: Not on file    Family History  Problem Relation Age of Onset   Breast cancer Sister    Colon cancer Brother 11       ????   Cancer Brother      Current Outpatient Medications:    nystatin (MYCOSTATIN) 100000 UNIT/ML suspension, Take 5 mLs (500,000 Units total) by mouth 4 (four) times daily., Disp: 60 mL, Rfl: 0   atropine 1 % ophthalmic solution, Place 1 drop into the right eye 2 (two) times a day. , Disp: , Rfl:    lidocaine-prilocaine (EMLA) cream, Apply to affected area once, Disp: 30 g, Rfl: 3   magnesium oxide (MAG-OX) 400 (240 Mg) MG tablet, Take 1 tablet (400 mg total) by  mouth daily., Disp: 30 tablet, Rfl: 1   Nutritional Supplements (K[employer]MS STANDARD 1.4) LIQD, 487 mLs by Enteral route 3 (three) times daily. 1.5 cartons (487 ml) [employer] 1.4 TID with 60 ml free water before and 60 ml free water after each TF bolus. Provide an additional 120 ml free water TID., Disp: , Rfl:    ondansetron (ZOFRAN) 8 MG tablet, Take 1 tablet (8 mg total) by mouth 2 (two) times daily as needed for refractory nausea / vomiting. Start on day 3 after chemo., Disp: 30 tablet, Rfl: 1   oxyCODONE (OXY IR/ROXICODONE) 5 MG immediate release tablet, Take 1 tablet (5 mg total) by mouth every 6 (six) hours as needed for severe pain., Disp: 120 tablet, Rfl: 0   prednisoLONE acetate (PRED FORTE) 1 % ophthalmic suspension, Place 1 drop into the right eye 4 (four) times daily., Disp: , Rfl:    prochlorperazine (COMPAZINE) 10 MG tablet, Take 1 tablet (10 mg total) by mouth every 6 (six) hours as needed (Nausea or vomiting)., Disp: 30 tablet, Rfl: 1   Vitamin D, Ergocalciferol, (DRISDOL) 1.25 MG (50000 UNIT) CAPS capsule, Take 50,000 Units by mouth every Saturday., Disp: , Rfl:    Water For Irrigation, Sterile (FREE WATER) SOLN, Place 100 mLs into feeding tube every 4 (four) hours., Disp: , Rfl:   Current Facility-Administered Medications:    heparin lock flush 100 unit/mL, 500 Units, Intracatheter, Once, Iruku, Praveena, MD   sodium chloride flush (NS) 0.9 % injection 10 mL, 10 mL, Intracatheter, Once, Iruku, Praveena, MD  PHYSICAL EXAM: ECOG FS:1 - Symptomatic but completely ambulatory    T: 100.0 F oral  BP: 128/73     HR: 89  Resp:20      O2:98% on room air Physical Exam Vitals and nursing note reviewed.  Constitutional:  Appearance: He is well-developed. He is not ill-appearing or toxic-appearing.     Comments: Thin appearing male, cachectic   HENT:     Head: Normocephalic and atraumatic.     Nose: Nose normal.     Mouth/Throat:     Comments: White coated noted on tongue  surface. No wounds or sores seen on oral mucosa. Eyes:     General: No scleral icterus.       Right eye: No discharge.        Left eye: No discharge.     Conjunctiva/sclera: Conjunctivae normal.  Neck:     Vascular: No JVD.  Cardiovascular:     Rate and Rhythm: Normal rate and regular rhythm.     Pulses: Normal pulses.     Heart sounds: Normal heart sounds.  Pulmonary:     Effort: Pulmonary effort is normal. No respiratory distress.     Breath sounds: Normal breath sounds. No stridor. No wheezing, rhonchi or rales.  Chest:     Chest wall: No tenderness.  Abdominal:     General: There is no distension.     Comments: Feeding tube without signs of infection  Musculoskeletal:        General: Normal range of motion.     Cervical back: Normal range of motion.     Right lower leg: No edema.     Left lower leg: No edema.  Skin:    General: Skin is warm and dry.     Capillary Refill: Capillary refill takes less than 2 seconds.     Findings: No rash.  Neurological:     Mental Status: He is oriented to person, place, and time.     GCS: GCS eye subscore is 4. GCS verbal subscore is 5. GCS motor subscore is 6.     Comments: Fluent speech, no facial droop.  Psychiatric:        Behavior: Behavior normal.       LABORATORY DATA: I have reviewed the data as listed CBC Latest Ref Rng & Units 08/12/2021 08/05/2021 07/29/2021  WBC 4.0 - 10.5 K/uL 4.1 2.7(L) 3.4(L)  Hemoglobin 13.0 - 17.0 g/dL 10.2(L) 9.4(L) 9.7(L)  Hematocrit 39.0 - 52.0 % 30.8(L) 29.3(L) 30.3(L)  Platelets 150 - 400 K/uL 197 214 158     CMP Latest Ref Rng & Units 08/12/2021 08/05/2021 07/29/2021  Glucose 70 - 99 mg/dL 133(H) 85 144(H)  BUN 8 - 23 mg/dL 18 5(L) 18  Creatinine 0.61 - 1.24 mg/dL 0.70 0.59(L) 0.64  Sodium 135 - 145 mmol/L 134(L) 137 135  Potassium 3.5 - 5.1 mmol/L 4.3 3.7 4.2  Chloride 98 - 111 mmol/L 96(L) 99 98  CO2 22 - 32 mmol/L 28 29 28   Calcium 8.9 - 10.3 mg/dL 8.8(L) 9.0 9.0  Total Protein  6.5 - 8.1 g/dL 7.4 7.1 7.1  Total Bilirubin 0.3 - 1.2 mg/dL 0.3 0.4 0.7  Alkaline Phos 38 - 126 U/L 41 50 49  AST 15 - 41 U/L 18 16 14(L)  ALT 0 - 44 U/L 11 11 7        RADIOGRAPHIC STUDIES: I have personally reviewed the radiological images as listed and agreed with the findings in the report. No images are attached to the encounter. CT Soft Tissue Neck W Contrast  Result Date: 07/19/2021 CLINICAL DATA:  Head neck cancer, assess treatment response EXAM: CT NECK WITH CONTRAST TECHNIQUE: Multidetector CT imaging of the neck was performed using the standard protocol following the bolus administration of intravenous  contrast. CONTRAST:  71mL OMNIPAQUE IOHEXOL 350 MG/ML SOLN COMPARISON:  05/09/2021 FINDINGS: Pharynx and larynx: Prominent submucosal low-density thickening attributed to radiotherapy. Ulcerated lesions at the bilateral palatine fossa, on the right with encompassed central fluid collection and measuring 7 mm. On the left there has been progressive avid enhancement measuring to 1 cm. Salivary glands: Post treatment atrophy on both sides. Thyroid: Normal Lymph nodes: Necrotic and enlarged lymph nodes in the left jugular chain have diminished in size, but are still abnormally heterogeneous and enhancing. A low left level 2 node previously measured 21 mm and is now 11 mm in maximum. Amorphous soft tissue along the right jugular chain is unchanged and hypoenhancing, suggestive of scar. Vascular: Stable. Diffuse atheromatous wall thickening of the carotids. Limited intracranial: Negative Visualized orbits: Bilateral cataract resection Mastoids and visualized paranasal sinuses: Clear Skeleton: Advanced cervical spondylosis. Upper chest: Reported on dedicated study. IMPRESSION: 1. Positive treatment response with decreased size of nodal metastases in the left jugular chain. There is likely residual active tumor in the treated nodes. 2. Ulcerated lesions in the bilateral palatine fossa, progressed on  the left and suspicious for synchronous tumors. Electronically Signed   By: Jorje Guild M.D.   On: 07/19/2021 21:46   CT Chest W Contrast  Result Date: 07/19/2021 CLINICAL DATA:  Restaging head and neck cancer EXAM: CT CHEST WITH CONTRAST TECHNIQUE: Multidetector CT imaging of the chest was performed during intravenous contrast administration. CONTRAST:  9mL OMNIPAQUE IOHEXOL 350 MG/ML SOLN COMPARISON:  PET-CT 01/31/2021. FINDINGS: Cardiovascular: Normal heart size. No pericardial effusion. Coronary artery calcifications. Mediastinum/Nodes: Normal appearance of the thyroid gland. The trachea appears patent and is midline. Normal appearance of the esophagus. No enlarged axillary, supraclavicular, mediastinal, or hilar lymph nodes. Lungs/Pleura: Mild paraseptal and centrilobular emphysema. No pleural effusion. Within the anterior left lung base there is a central area peribronchovascular consolidation. Additionally, multifocal patchy areas of areas of ground-glass and nodular airspace densities with thickening of the associated peribronchovascular interstitium are noted. For example, within the anterior left lower lobe there is a irregular nodular density measuring 9 mm, image 100/6. In the superior segment of the left lower lobe there is a 6 mm nodule, image 77/6. A few irregular patchy nodular densities are noted within the periphery of the posteromedial right lower lobe, image 80/6 as well as in the right middle lobe. For example, within the subpleural aspect of the posteromedial right lower lobe there is a irregular nodular density measuring 1.2 cm with surrounding ground-glass attenuation, image 80/6. Within the right upper lobe there are a few focal nodular areas which appear to represent foci of mucoid impaction within dilated bronchials, image 70/6. Upper Abdomen: A gastrostomy tube is identified within the left upper quadrant of the abdomen. No acute findings noted within the imaged portions of the  upper abdomen. Musculoskeletal: None IMPRESSION: 1. No highly specific findings identified to suggest pulmonary or nodal metastasis within the chest 2. Multifocal patchy areas of ground-glass and nodular airspace densities. Surrounding postinflammatory changes are noted with thickening of the peribronchovascular interstitium. Imaging findings are concerning for sequelae of aspiration and/or multifocal infection. Follow-up imaging after appropriate antibiotic therapy is recommended with repeat CT of the chest in 3 months to ensure resolution and to rule out the possibility of underlying metastatic nodule. 3. Coronary artery calcifications. 4. Aortic Atherosclerosis (ICD10-I70.0) and Emphysema (ICD10-J43.9). Electronically Signed   By: Kerby Moors M.D.   On: 07/19/2021 13:00     ASSESSMENT & PLAN: Patient is a 70  y.o. male with history of squamous cell carcinoma of right supraglottis on weekly chemo carboplatin and paclitaxel followed by oncologist Dr. Alvy Bimler while previous oncologist is on maternity leave.  #) cough- Low grade temp 100.0. Patient non toxic appearing. Lungs without rales, stridor, wheezing. No respiratory distress.Tylenol given at patient's request. Covid and flu swab collected and is in process. If positive I will call him in tamiflu or paxlovid.   #sore throat- signs of thrush on exam. Has history of chronic dysphagia and aspiration. Will hold off viscous lidocaine for pain 2/2 aspiration risk. Patient given prescription for Nystatin and understands to swish and spit.  #) SCC of right supraglottis- patient reports feeling poorly overall and with low grade temp of 100.0. Per oncologist Dr. Alvy Bimler will hold off on chemotherapy today.   Visit Diagnosis: 1. Acute cough   2. Thrush   3. 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 Port-A-Cath in place      No orders of the defined types were placed in this encounter.   All questions were answered. The patient  knows to call the clinic with any problems, questions or concerns. No barriers to learning was detected.  I have spent a total of 20 minutes minutes of face-to-face and non-face-to-face time, preparing to see the patient, obtaining and/or reviewing separately obtained history, performing a medically appropriate examination, counseling and educating the patient, ordering tests,  documenting clinical information in the electronic health record, and care coordination.     Thank you for allowing me to participate in the care of this patient.    Barrie Folk, PA-C Department of Hematology/Oncology [facility] at [facility] Phone: [phone]  [phone]    08/12/2021 5:44 PM

## 2021-08-12 NOTE — Progress Notes (Signed)
Patient presents today for treatment with c/o SHOB, cough, fever, severe sore throat since Saturday. Dr Alvy Bimler notified and order given to hold treatment for today. Sherol Dade, PA-C notified of patient's symptoms as well and is present at chairside to evaluate patient.

## 2021-08-12 NOTE — Patient Instructions (Addendum)
Thee white patches on your tongue look like thrush. A prescription for Nystatin was sent to [facility] for your. Swish and spit out this medication to help treat it. Take as prescribed.   I will call you with your covid and flu test results. If either are positive I can call you in medications for them.  You can take Robitussin (over the counter medication) to help with your cough.  Take tylenol for fever and body aches as directed on the bottle.

## 2021-08-13 ENCOUNTER — Encounter: Payer: Self-pay | Admitting: *Deleted

## 2021-08-13 ENCOUNTER — Ambulatory Visit (HOSPITAL_BASED_OUTPATIENT_CLINIC_OR_DEPARTMENT_OTHER): Payer: Medicare HMO | Admitting: Physician Assistant

## 2021-08-13 ENCOUNTER — Other Ambulatory Visit: Payer: Self-pay | Admitting: Physician Assistant

## 2021-08-13 ENCOUNTER — Telehealth: Payer: Self-pay

## 2021-08-13 DIAGNOSIS — U071 COVID-19: Secondary | ICD-10-CM | POA: Diagnosis not present

## 2021-08-13 DIAGNOSIS — Z5111 Encounter for antineoplastic chemotherapy: Secondary | ICD-10-CM | POA: Diagnosis not present

## 2021-08-13 DIAGNOSIS — C321 Malignant neoplasm of supraglottis: Secondary | ICD-10-CM

## 2021-08-13 LAB — RESP PANEL BY RT-PCR (RSV, FLU A&B, COVID)  RVPGX2
Influenza A by PCR: NEGATIVE
Influenza B by PCR: NEGATIVE
Resp Syncytial Virus by PCR: NEGATIVE
SARS Coronavirus 2 by RT PCR: POSITIVE — AB

## 2021-08-13 NOTE — Progress Notes (Signed)
I connected with [name] on 08/13/21 at 10:00 AM EST by telephone and verified that I am speaking with the correct person using two identifiers.   I discussed the limitations, risks, security and privacy concerns of performing an evaluation and management service by telemedicine and the availability of in-person appointments. I also discussed with the patient that there may be a patient responsible charge related to this service. The patient expressed understanding and agreed to proceed.   Other persons participating in the visit and their role in the encounter: Verdene Lennert, patient's caregiver  Patient's location: home Provider's location: office in [place]    Patient Care Team: [name], MD as PCP - General (Internal Medicine) Izora Gala, MD as Consulting Physician (Otolaryngology) [name], MD as Attending Physician (Radiation Oncology) Leota Sauers, RN (Inactive) as Oncology Nurse Navigator Karie Mainland, RD as Dietitian (Nutrition) Valentino Saxon [place], [place] as Speech Language Pathologist (Speech Pathology) Kennith Center, LCSW as Social Worker [name], MD as Consulting Physician (Hematology and Oncology) Malmfelt, Stephani Police, RN as Oncology Nurse Navigator    Name of the [name]  [id number]  1950-10-19   Date of visit: 08/13/2021    Chief complaint/ Reason for visit- f/u test results  Oncology History Overview Note  Recurrent SCC, p16 neg   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 SEE NOTE   04/01/2019 Surgery   Pre-Op Dx:   right supraglottic carcinoma   Post-op Dx: T2 N0 M0 (stage II) right supraglottic carcinoma   Proc: Direct laryngoscopy with biopsy.  Cervical esophagoscopy.  Bronchoscopy.   Surg:  [name]    Findings: A bulky necrotic and semi-pedunculated tumor coming off the lateral surface of the right aryepiglottic fold and the anterior and lateral aspect of the piriform sinus on the right side.  Airway was compromised by the tumor mass at intubation.   04/11/2019 Imaging   CT neck: IMPRESSION: The glottis is closed with suboptimal evaluation of the vocal cords. No asymmetry of the cords. Correlate with laryngoscopy results.   No enlarged lymph nodes in the neck. 7 mm right level 2 lymph node may be reactive. No definite pathologic lymph nodes in the neck.   04/11/2019 Imaging   CT neck  This CT was performed after surgical debulking of an exophytic laryngeal tumor.   Most apparent on series 2, image 51 there is abnormal asymmetric hyperenhancement along the surface of the right piriform sinus, and perhaps also involving the bilateral AE folds.   04/22/2019 Imaging   PET: IMPRESSION: 1. Mild residual activity in the posterior RIGHT hypopharynx confined to the mucosa. 2. No evidence of hypermetabolic metastatic lymph nodes in LEFT or RIGHT neck. 3. No evidence distant metastatic disease.   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 New  FDG avid right level 2 cervical lymph node concerning for recurrent disease. No findings of distant metastatic disease within the chest, abdomen or pelvis. 2. Mild nonspecific increased uptake in the right side of larynx is similar to previous exam. 3. Aortic Atherosclerosis (ICD10-I70.0). Coronary artery calcifications.    11/21/2019 Pathology Results   SURGICAL PATHOLOGY  CASE: [id number]  PATIENT: Caliber Heims  Surgical Pathology Report   FINAL MICROSCOPIC DIAGNOSIS:   A. LYMPH NODE, RIGHT CERVICAL:  - Poorly differentiated carcinoma consistent with metastatic squamous cell carcinoma.  - See comment   01/06/2020 Pathology Results   SURGICAL PATHOLOGY  CASE: [id number]  PATIENT: Laythan Strehle  Surgical Pathology Report   Clinical History: metastatic cancer to cervical lymph nodes (cm)   FINAL MICROSCOPIC DIAGNOSIS:   A. LYMPH NODE, RIGHT MODIFIED, DISSECTION:  - Metastatic squamous cell carcinoma in four of nineteen lymph nodes (4/19).  - Largest metastatic nodule is 3.1 cm with 5 mm extra-nodal extension.  - Lymphovascular space involvement in peri-nodal connective tissue.  - See comment.    01/06/2020 Surgery   PRE-OPERATIVE DIAGNOSIS:  METASTATIC CANCER TO CERVICAL LYMPH NODES   POST-OPERATIVE DIAGNOSIS:  METASTATIC CANCER TO CERVICAL LYMPH NODES   PROCEDURE:  Procedure(s): DIRECT LARYNGOSCOPY MODIFIED RADICAL NECK DISSECTION   SURGEON:  [name], MD     SPECIMEN: Right modified neck dissection including levels 2 and 3, including the internal jugular vein, suture marks the inferior jugular vein stump.   07/05/2020 Imaging   1. Sequelae of radiation and right neck dissection from the previous contrasted Neck CT.   2. Asymmetric soft tissue thickening and enhancement along the right lateral pharynx. Although this might be asymmetric mucositis, the appearance is suspicious and recommend direct inspection. NI-RADS category 2a.   3. Suspicious small 6 mm  hyperenhancing nodular soft tissue along the posterior margin of the neck dissection at right level 3. NI-RADS category 2 vs 3 - although this is likely too small for imaging guided biopsy. Repeat PET-CT may be most valuable.   4. Mild inflammatory appearing right upper lobe centrilobular ground-glass opacity, new since February. Consider mild or developing right upper lobe infection. Post radiation changes to the lung apices.       08/13/2020 Pathology Results   A. OROPHARNYX, BIOPSY:  - Poorly differentiated squamous cell carcinoma.  - See comment.    08/13/2020 Surgery   POST-OPERATIVE DIAGNOSIS:  Tonsillary Mass History of Laryngeal  Cancer   PROCEDURE:  Procedure(s): DIRECT LARYNGOSCOPY BIOPSY OF OROPHARYNGEAL MASS   08/30/2020 PET scan   1. Recurrence of head neck carcinoma with a broad hypermetabolic pharyngeal mucosal lesion involving the RIGHT lateral posterior oropharynx and hypopharynx extending from the palatine tonsil to the vallecula. 2. Hypermetabolic lymph node posterior sternocleidomastoid muscle on the RIGHT (level 3). 3. Hypermetabolic nodule within the LEFT parotid glands favored small primary parotid neoplasm. 4. No evidence thoracic metastasis   09/24/2020 - 10/30/2020 Chemotherapy   He received weekly cisplatin   01/31/2021 PET scan   Findings of worsening nodal disease with a node or group of nodes in the LEFT neck and a RIGHT supraclavicular lymph node as discussed.   Post treatment changes with soft tissue fullness in the area of  the prior tumor but with markedly diminished metabolic activity.     03/11/2021 Imaging   Ct neck  Diffuse pharyngeal and laryngeal mucosal and submucosal edema probably related to acute radiation injury. The airway is narrow and could possibly be critically narrowed. I do not see any evidence of recurrent mucosal or submucosal tumor identifiable.   Similar appearance of the recurrent malignant lymphadenopathy on the left at level  2 and level 3 and on the right in the low supraclavicular region just above and behind the clavicular head.   04/12/2021 Imaging   Ct abdomen and pelvis  1. Contrast material in the peritoneal cavity of the upper abdomen suggesting leak along the gastrostomy tract. 2. Gastrostomy tube is appropriately positioned. No evidence of abscess. 3. 1.3 cm right middle lobe nodule, new since 01/31/2021. Favor infectious/inflammatory over primary/secondary neoplasm given the relatively short time frame of development.   04/12/2021 Surgery   Postoperative Diagnosis: Perforated duodenal ulcer (1st portion)   Surgical Procedure:  Modified Graham patch of duodenal ulcer Gastrostomy tube exchange   Operative Team Members:  Surgeon(s) and Role:    * Stechschulte, Nickola Major, MD - Primary     Drains:  (19 Fr) Jackson-Pratt drain(s) with closed bulb suction in the right upper quadrant near the duodenal ulcer repair and Gastrostomy Tube 26 fr   05/09/2021 Imaging   1. Progression of lesion in the right tonsil with central fluid collection and peripheral enhancement. This was a site of prior recurrent sick could represent progressive tumor or abscess. Continued follow-up recommended. 2. Progressive malignant lymphadenopathy in the left neck. Stable lymphadenopathy right supraclavicular region. 3. New area of stranding in the superior segment left lower lobe but certain etiology. Attention on follow-up recommended.     06/13/2021 -  Chemotherapy   Patient is on Treatment Plan :  Paclitaxel + Carboplatin q7d     06/13/2021 -  Chemotherapy   Patient is on Treatment Plan : HEAD/NECK Pembrolizumab Q21D     07/18/2021 Imaging   Ct chest 1. No highly specific findings identified to suggest pulmonary or nodal metastasis within the chest 2. Multifocal patchy areas of ground-glass and nodular airspace densities. Surrounding postinflammatory changes are noted with thickening of the peribronchovascular interstitium.  Imaging findings are concerning for sequelae of aspiration and/or multifocal infection. Follow-up imaging after appropriate antibiotic therapy is recommended with repeat CT of the chest in 3 months to ensure resolution and to rule out the possibility of underlying metastatic nodule. 3. Coronary artery calcifications. 4. Aortic Atherosclerosis (ICD10-I70.0) and Emphysema (ICD10-J43.9).   07/18/2021 Imaging   CT neck  1. Positive treatment response with decreased size of nodal metastases in the left jugular chain. There is likely residual active tumor in the treated nodes. 2. Ulcerated lesions in the bilateral palatine fossa, progressed on the left and suspicious for synchronous tumors.   07/23/2021 Cancer Staging   Staging form: Larynx - Supraglottis, AJCC 8th Edition - Clinical stage from 07/23/2021: Stage IVA (rcT2, cN2b, cM0) - Signed by [name], MD on 07/23/2021 Stage prefix: Recurrence      Current Therapy: weekly carboplatin and paclitaxel, last treatment 08/05/21  Interval history- Dmoni E. Sarate is a 70 yo male with history as stated above contacted via telephone to discuss covid test results from swab collected yesterday. Patient's caregiver Verdene Lennert is also on the call and Alison gives permission to discuss results with her as well. Patient informed covid test is positive. He is still endorsing sore throat and feeling poorly.  He is taking his pain medication oxycodone, no other OTC medications. He is tolerating PO intake through feeding tube. Denies fever, chills, shortness of breath, wheezing, chest pain, abdominal pain, nausea, vomiting, urinary symptoms, diarrhea.       ROS  All other systems are reviewed and are negative for acute change except as noted in the HPI.    No Known Allergies   Past Medical History:  Diagnosis Date   Cancer ([place])    Throat cancer 2019   ETOH abuse    Frequent urination    Glaucoma    Hepatitis C virus infection without hepatic coma     dx'ed in 11/2018   History of radiation therapy 05/12/19- 07/06/19   Larynx   Hypertension    Wears denture    upper only; lost lower denture     Past Surgical History:  Procedure Laterality Date   ANKLE SURGERY  2011   right ankle   COLONOSCOPY  02/2019   polyps - Dr Havery Moros   DIRECT LARYNGOSCOPY N/A 04/01/2019   Procedure: DIRECT LARYNGOSCOPY WITH BIOPSY;  Surgeon: Jodi Marble, MD;  Location: [facility];  Service: ENT;  Laterality: N/A;   DIRECT LARYNGOSCOPY N/A 01/06/2020   Procedure: DIRECT LARYNGOSCOPY;  Surgeon: Izora Gala, MD;  Location: [facility];  Service: ENT;  Laterality: N/A;   DIRECT LARYNGOSCOPY N/A 08/13/2020   Procedure: DIRECT LARYNGOSCOPY;  Surgeon: Izora Gala, MD;  Location: [facility];  Service: ENT;  Laterality: N/A;   ESOPHAGOSCOPY N/A 04/01/2019   Procedure: ESOPHAGOSCOPY;  Surgeon: Jodi Marble, MD;  Location: [facility];  Service: ENT;  Laterality: N/A;   EXCISION ORAL TUMOR Right 08/13/2020   Procedure: BIOPSY OF OROPHARYNGEAL MASS;  Surgeon: Izora Gala, MD;  Location: [facility];  Service: ENT;  Laterality: Right;   EYE SURGERY Right    IR CM INJ ANY COLONIC TUBE W/FLUORO  12/20/2020   IR CM INJ ANY COLONIC TUBE W/FLUORO  03/20/2021   IR GASTROSTOMY TUBE MOD SED  09/24/2020   IR IMAGING GUIDED PORT INSERTION  09/24/2020   IR RADIOLOGIST EVAL & MGMT  07/02/2021   KNEE SURGERY     LAPAROTOMY N/A 04/12/2021   Procedure: EXPLORATORY LAPAROTOMY, REPAIR OF DUODENAL ULCER WITH Silvestre Gunner;  Surgeon: Felicie Morn, MD;  Location: [facility];  Service: General;  Laterality: N/A;   LARYNGOSCOPY AND BRONCHOSCOPY N/A 04/01/2019   Procedure: BRONCHOSCOPY;  Surgeon: Jodi Marble, MD;  Location: [facility];  Service: ENT;  Laterality: N/A;   RADICAL NECK DISSECTION N/A 01/06/2020   Procedure: RADICAL NECK DISSECTION;  Surgeon: Izora Gala, MD;  [facility];  Service: ENT;  Laterality: N/A;    Social History   Socioeconomic History   Marital  status: Single    Spouse name: Not on file   Number of children: 2   Years of education: Not on file   Highest education level: Not on file  Occupational History   Not on file  Tobacco Use   Smoking status: Former    Years: 50.00    Types: Cigarettes    Quit date: 06/01/2020    Years since quitting: 1.2   Smokeless tobacco: Never  Vaping Use   Vaping Use: Never used  Substance and Sexual Activity   Alcohol use: Yes    Alcohol/week: 4.0 standard drinks    Types: 4 Cans of beer per week    Comment: 40oz beer daily   Drug use: Yes    Types: Cocaine  Comment: none in 2 yrs   Sexual activity: Yes    Partners: Female  Other Topics Concern   Not on file  Social History Narrative   Patient is divorced with 2 children.   Patient is currently living with his sister.   Patient with a history of smoking a third of pack of cigarettes daily for 50 years.  Patient currently smoking 2 to 3 cigarettes/day.   Patient has never used smokeless tobacco.   Patient with occasional use of alcohol.   Patient last used cocaine approximately 6 months ago.  Patient denies use of marijuana.   Social Determinants of Health   Financial Resource Strain: Not on file  Food Insecurity: No Food Insecurity   Worried About Charity fundraiser in the Last Year: Never true   Ran Out of Food in the Last Year: Never true  Transportation Needs: No Transportation Needs   Lack of Transportation (Medical): No   Lack of Transportation (Non-Medical): No  Physical Activity: Not on file  Stress: Not on file  Social Connections: Unknown   Frequency of Communication with Friends and Family: Three times a week   Frequency of Social Gatherings with Friends and Family: More than three times a week   Attends Religious Services: Not on file   Active Member of Clubs or Organizations: Not on file   Attends Archivist Meetings: Not on file   Marital Status: Not on file  Intimate Partner Violence: Not on file     Family History  Problem Relation Age of Onset   Breast cancer Sister    Colon cancer Brother 92       ????   Cancer Brother      Current Outpatient Medications:    atropine 1 % ophthalmic solution, Place 1 drop into the right eye 2 (two) times a day. , Disp: , Rfl:    lidocaine-prilocaine (EMLA) cream, Apply to affected area once, Disp: 30 g, Rfl: 3   magnesium oxide (MAG-OX) 400 (240 Mg) MG tablet, Take 1 tablet (400 mg total) by mouth daily., Disp: 30 tablet, Rfl: 1   Nutritional Supplements ([employer] STANDARD 1.4) LIQD, 487 mLs by Enteral route 3 (three) times daily. 1.5 cartons (487 ml) [employer] 1.4 TID with 60 ml free water before and 60 ml free water after each TF bolus. Provide an additional 120 ml free water TID., Disp: , Rfl:    nystatin (MYCOSTATIN) 100000 UNIT/ML suspension, Take 5 mLs (500,000 Units total) by mouth 4 (four) times daily., Disp: 60 mL, Rfl: 0   ondansetron (ZOFRAN) 8 MG tablet, Take 1 tablet (8 mg total) by mouth 2 (two) times daily as needed for refractory nausea / vomiting. Start on day 3 after chemo., Disp: 30 tablet, Rfl: 1   oxyCODONE (OXY IR/ROXICODONE) 5 MG immediate release tablet, Take 1 tablet (5 mg total) by mouth every 6 (six) hours as needed for severe pain., Disp: 120 tablet, Rfl: 0   prednisoLONE acetate (PRED FORTE) 1 % ophthalmic suspension, Place 1 drop into the right eye 4 (four) times daily., Disp: , Rfl:    prochlorperazine (COMPAZINE) 10 MG tablet, Take 1 tablet (10 mg total) by mouth every 6 (six) hours as needed (Nausea or vomiting)., Disp: 30 tablet, Rfl: 1   Vitamin D, Ergocalciferol, (DRISDOL) 1.25 MG (50000 UNIT) CAPS capsule, Take 50,000 Units by mouth every Saturday., Disp: , Rfl:    Water For Irrigation, Sterile (FREE WATER) SOLN, Place 100 mLs into feeding  tube every 4 (four) hours., Disp: , Rfl:   PHYSICAL EXAM: ECOG FS:2 - Symptomatic, <50% confined to bed   Physical Exam  Patient speaking in full sentences, no  respiratory distress   LABORATORY DATA: I have reviewed the data as listed CBC Latest Ref Rng & Units 08/12/2021 08/05/2021 07/29/2021  WBC 4.0 - 10.5 K/uL 4.1 2.7(L) 3.4(L)  Hemoglobin 13.0 - 17.0 g/dL 10.2(L) 9.4(L) 9.7(L)  Hematocrit 39.0 - 52.0 % 30.8(L) 29.3(L) 30.3(L)  Platelets 150 - 400 K/uL 197 214 158     CMP Latest Ref Rng & Units 08/12/2021 08/05/2021 07/29/2021  Glucose 70 - 99 mg/dL 133(H) 85 144(H)  BUN 8 - 23 mg/dL 18 5(L) 18  Creatinine 0.61 - 1.24 mg/dL 0.70 0.59(L) 0.64  Sodium 135 - 145 mmol/L 134(L) 137 135  Potassium 3.5 - 5.1 mmol/L 4.3 3.7 4.2  Chloride 98 - 111 mmol/L 96(L) 99 98  CO2 22 - 32 mmol/L 28 29 28   Calcium 8.9 - 10.3 mg/dL 8.8(L) 9.0 9.0  Total Protein 6.5 - 8.1 g/dL 7.4 7.1 7.1  Total Bilirubin 0.3 - 1.2 mg/dL 0.3 0.4 0.7  Alkaline Phos 38 - 126 U/L 41 50 49  AST 15 - 41 U/L 18 16 14(L)  ALT 0 - 44 U/L 11 11 7        RADIOGRAPHIC STUDIES: I have personally reviewed the radiological images as listed and agreed with the findings in the report. No images are attached to the encounter. CT Soft Tissue Neck W Contrast  Result Date: 07/19/2021 CLINICAL DATA:  Head neck cancer, assess treatment response EXAM: CT NECK WITH CONTRAST TECHNIQUE: Multidetector CT imaging of the neck was performed using the standard protocol following the bolus administration of intravenous contrast. CONTRAST:  4mL OMNIPAQUE IOHEXOL 350 MG/ML SOLN COMPARISON:  05/09/2021 FINDINGS: Pharynx and larynx: Prominent submucosal low-density thickening attributed to radiotherapy. Ulcerated lesions at the bilateral palatine fossa, on the right with encompassed central fluid collection and measuring 7 mm. On the left there has been progressive avid enhancement measuring to 1 cm. Salivary glands: Post treatment atrophy on both sides. Thyroid: Normal Lymph nodes: Necrotic and enlarged lymph nodes in the left jugular chain have diminished in size, but are still abnormally heterogeneous  and enhancing. A low left level 2 node previously measured 21 mm and is now 11 mm in maximum. Amorphous soft tissue along the right jugular chain is unchanged and hypoenhancing, suggestive of scar. Vascular: Stable. Diffuse atheromatous wall thickening of the carotids. Limited intracranial: Negative Visualized orbits: Bilateral cataract resection Mastoids and visualized paranasal sinuses: Clear Skeleton: Advanced cervical spondylosis. Upper chest: Reported on dedicated study. IMPRESSION: 1. Positive treatment response with decreased size of nodal metastases in the left jugular chain. There is likely residual active tumor in the treated nodes. 2. Ulcerated lesions in the bilateral palatine fossa, progressed on the left and suspicious for synchronous tumors. Electronically Signed   By: Jorje Guild M.D.   On: 07/19/2021 21:46   CT Chest W Contrast  Result Date: 07/19/2021 CLINICAL DATA:  Restaging head and neck cancer EXAM: CT CHEST WITH CONTRAST TECHNIQUE: Multidetector CT imaging of the chest was performed during intravenous contrast administration. CONTRAST:  58mL OMNIPAQUE IOHEXOL 350 MG/ML SOLN COMPARISON:  PET-CT 01/31/2021. FINDINGS: Cardiovascular: Normal heart size. No pericardial effusion. Coronary artery calcifications. Mediastinum/Nodes: Normal appearance of the thyroid gland. The trachea appears patent and is midline. Normal appearance of the esophagus. No enlarged axillary, supraclavicular, mediastinal, or hilar lymph nodes. Lungs/Pleura: Mild paraseptal and  centrilobular emphysema. No pleural effusion. Within the anterior left lung base there is a central area peribronchovascular consolidation. Additionally, multifocal patchy areas of areas of ground-glass and nodular airspace densities with thickening of the associated peribronchovascular interstitium are noted. For example, within the anterior left lower lobe there is a irregular nodular density measuring 9 mm, image 100/6. In the superior  segment of the left lower lobe there is a 6 mm nodule, image 77/6. A few irregular patchy nodular densities are noted within the periphery of the posteromedial right lower lobe, image 80/6 as well as in the right middle lobe. For example, within the subpleural aspect of the posteromedial right lower lobe there is a irregular nodular density measuring 1.2 cm with surrounding ground-glass attenuation, image 80/6. Within the right upper lobe there are a few focal nodular areas which appear to represent foci of mucoid impaction within dilated bronchials, image 70/6. Upper Abdomen: A gastrostomy tube is identified within the left upper quadrant of the abdomen. No acute findings noted within the imaged portions of the upper abdomen. Musculoskeletal: None IMPRESSION: 1. No highly specific findings identified to suggest pulmonary or nodal metastasis within the chest 2. Multifocal patchy areas of ground-glass and nodular airspace densities. Surrounding postinflammatory changes are noted with thickening of the peribronchovascular interstitium. Imaging findings are concerning for sequelae of aspiration and/or multifocal infection. Follow-up imaging after appropriate antibiotic therapy is recommended with repeat CT of the chest in 3 months to ensure resolution and to rule out the possibility of underlying metastatic nodule. 3. Coronary artery calcifications. 4. Aortic Atherosclerosis (ICD10-I70.0) and Emphysema (ICD10-J43.9). Electronically Signed   By: Kerby Moors M.D.   On: 07/19/2021 13:00     ASSESSMENT & PLAN: Patient is a 70 y.o. male with history of squamous cell carcinoma of right supraglottis on weekly carboplating and [paclitaxel followed by oncologist Dr. Alvy Bimler while previous oncologist is on maternity leave.   #)covid- Patient tested positive for covid yesterday. He is unfortunately not a candidate for paxlovid because his medications need to be administered through feeding tube. Discussed quarantine  guidelines per CDC and OTC symptomatic management. Also discussed ED precautions with patient and caregiver.  #) SCC of right supraglottis- Patient unable to get treatment yesterday as he was not feeling well. Oncologist Dr. Alvy Bimler notified of positive test result. Plan to hold next 2 treatments and will reschedule office visit. Patient prefers in person visits so his sister and caregiver can be present.   Visit Diagnosis: 1. COVID   2. 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   All questions were answered. The patient knows to call the clinic with any problems, questions or concerns. No barriers to learning was detected.  I have spent a total of 10 minutes minutes of non-face-to-face time, preparing to see the patient, obtaining and/or reviewing separately obtained history, counseling and educating the patient,  documenting clinical information in the electronic health record, and care coordination.     Thank you for allowing me to participate in the care of this patient.    Barrie Folk, PA-C Department of Hematology/Oncology [facility] Phone: [phone]  [phone]    08/13/2021 12:56 PM

## 2021-08-13 NOTE — Progress Notes (Signed)
Oncology Nurse Navigator Documentation   I called and spoke to [place], Mr. Kattner' caregiver about his upcoming appointments scheduled at the [facility]. I informed her that due to his recent positive COVID test all future appointments have been cancelled by Dr. Alvy Bimler. Dr. Alvy Bimler has sent a message to scheduling to have Mr. Kring seen by Dr. Chryl Heck in 3 weeks after her return from maternity leave. Veronica voiced her understanding and knows to call me if she has any further questions or concerns.  Harlow Asa RN, BSN, OCN Head & Neck Oncology Nurse [place] at [facility] Phone # [phone]  Fax # [phone]

## 2021-08-13 NOTE — Progress Notes (Unsigned)
Positive SARS Coronavirus 2 by RT PCR called to Yetta Glassman, LPN by Dorian Furnace ([place]) 08/13/2021

## 2021-08-13 NOTE — Telephone Encounter (Signed)
CRITICAL VALUE STICKER  CRITICAL VALUE: COVID POSITIVE  RECEIVER (on-site recipient of call): Yetta Glassman, CMA  DATE & TIME NOTIFIED: 08/13/21 at 8:35am  MESSENGER (representative from lab): Rosann Auerbach  MD NOTIFIED: Raliegh Ip. Wallislewicz PA-C  TIME OF NOTIFICATION: 08/13/21 at 10am (her arrival time)  RESPONSE: Notification given directly to [facility] for follow-up with the pt.

## 2021-08-15 ENCOUNTER — Ambulatory Visit: Payer: Medicare HMO

## 2021-08-15 ENCOUNTER — Telehealth: Payer: Self-pay | Admitting: Hematology and Oncology

## 2021-08-15 ENCOUNTER — Other Ambulatory Visit: Payer: Medicare HMO

## 2021-08-15 NOTE — Telephone Encounter (Signed)
Scheduled per sch msg. Called and left msg for patients sister

## 2021-08-20 ENCOUNTER — Inpatient Hospital Stay: Payer: Medicare HMO | Admitting: Hematology and Oncology

## 2021-08-20 ENCOUNTER — Inpatient Hospital Stay: Payer: Medicare HMO | Admitting: Dietician

## 2021-08-20 ENCOUNTER — Inpatient Hospital Stay: Payer: Medicare HMO

## 2021-08-20 ENCOUNTER — Other Ambulatory Visit: Payer: Self-pay | Admitting: Hematology and Oncology

## 2021-08-20 MED ORDER — OXYCODONE HCL 5 MG PO TABS: 5.0000 mg | ORAL_TABLET | Freq: Four times a day (QID) | ORAL | 0 refills | Status: DC | PRN

## 2021-08-26 ENCOUNTER — Inpatient Hospital Stay: Payer: Medicare HMO

## 2021-08-29 MED FILL — Dexamethasone Sodium Phosphate Inj 100 MG/10ML: INTRAMUSCULAR | Qty: 1 | Status: AC

## 2021-08-30 ENCOUNTER — Inpatient Hospital Stay (HOSPITAL_BASED_OUTPATIENT_CLINIC_OR_DEPARTMENT_OTHER): Payer: Medicare HMO | Admitting: Hematology and Oncology

## 2021-08-30 ENCOUNTER — Encounter: Payer: Self-pay | Admitting: Hematology and Oncology

## 2021-08-30 ENCOUNTER — Inpatient Hospital Stay: Payer: Medicare HMO | Attending: Hematology and Oncology | Admitting: Dietician

## 2021-08-30 ENCOUNTER — Other Ambulatory Visit: Payer: Self-pay

## 2021-08-30 ENCOUNTER — Inpatient Hospital Stay: Payer: Medicare HMO

## 2021-08-30 DIAGNOSIS — C090 Malignant neoplasm of tonsillar fossa: Secondary | ICD-10-CM

## 2021-08-30 DIAGNOSIS — Z931 Gastrostomy status: Secondary | ICD-10-CM | POA: Insufficient documentation

## 2021-08-30 DIAGNOSIS — G620 Drug-induced polyneuropathy: Secondary | ICD-10-CM | POA: Diagnosis not present

## 2021-08-30 DIAGNOSIS — Z95828 Presence of other vascular implants and grafts: Secondary | ICD-10-CM

## 2021-08-30 DIAGNOSIS — E43 Unspecified severe protein-calorie malnutrition: Secondary | ICD-10-CM

## 2021-08-30 DIAGNOSIS — C321 Malignant neoplasm of supraglottis: Secondary | ICD-10-CM

## 2021-08-30 DIAGNOSIS — Z5111 Encounter for antineoplastic chemotherapy: Secondary | ICD-10-CM | POA: Diagnosis present

## 2021-08-30 DIAGNOSIS — T451X5A Adverse effect of antineoplastic and immunosuppressive drugs, initial encounter: Secondary | ICD-10-CM

## 2021-08-30 DIAGNOSIS — C329 Malignant neoplasm of larynx, unspecified: Secondary | ICD-10-CM | POA: Insufficient documentation

## 2021-08-30 DIAGNOSIS — G893 Neoplasm related pain (acute) (chronic): Secondary | ICD-10-CM

## 2021-08-30 DIAGNOSIS — Z79899 Other long term (current) drug therapy: Secondary | ICD-10-CM | POA: Insufficient documentation

## 2021-08-30 LAB — CBC WITH DIFFERENTIAL/PLATELET
Abs Immature Granulocytes: 0.04 10*3/uL (ref 0.00–0.07)
Basophils Absolute: 0 10*3/uL (ref 0.0–0.1)
Basophils Relative: 1 %
Eosinophils Absolute: 0.1 10*3/uL (ref 0.0–0.5)
Eosinophils Relative: 2 %
HCT: 28.8 % — ABNORMAL LOW (ref 39.0–52.0)
Hemoglobin: 9.3 g/dL — ABNORMAL LOW (ref 13.0–17.0)
Immature Granulocytes: 1 %
Lymphocytes Relative: 19 %
Lymphs Abs: 1 10*3/uL (ref 0.7–4.0)
MCH: 31.2 pg (ref 26.0–34.0)
MCHC: 32.3 g/dL (ref 30.0–36.0)
MCV: 96.6 fL (ref 80.0–100.0)
Monocytes Absolute: 0.7 10*3/uL (ref 0.1–1.0)
Monocytes Relative: 13 %
Neutro Abs: 3.3 10*3/uL (ref 1.7–7.7)
Neutrophils Relative %: 64 %
Platelets: 261 10*3/uL (ref 150–400)
RBC: 2.98 MIL/uL — ABNORMAL LOW (ref 4.22–5.81)
RDW: 16.3 % — ABNORMAL HIGH (ref 11.5–15.5)
WBC: 5.1 10*3/uL (ref 4.0–10.5)
nRBC: 0 % (ref 0.0–0.2)

## 2021-08-30 LAB — COMPREHENSIVE METABOLIC PANEL
ALT: 7 U/L (ref 0–44)
AST: 13 U/L — ABNORMAL LOW (ref 15–41)
Albumin: 3 g/dL — ABNORMAL LOW (ref 3.5–5.0)
Alkaline Phosphatase: 54 U/L (ref 38–126)
Anion gap: 7 (ref 5–15)
BUN: 18 mg/dL (ref 8–23)
CO2: 28 mmol/L (ref 22–32)
Calcium: 8.6 mg/dL — ABNORMAL LOW (ref 8.9–10.3)
Chloride: 104 mmol/L (ref 98–111)
Creatinine, Ser: 0.6 mg/dL — ABNORMAL LOW (ref 0.61–1.24)
GFR, Estimated: >60 mL/min (ref >60)
Glucose, Bld: 104 mg/dL — ABNORMAL HIGH (ref 70–99)
Potassium: 4.2 mmol/L (ref 3.5–5.1)
Sodium: 139 mmol/L (ref 135–145)
Total Bilirubin: 0.3 mg/dL (ref 0.3–1.2)
Total Protein: 7 g/dL (ref 6.5–8.1)

## 2021-08-30 LAB — MAGNESIUM: Magnesium: 1.6 mg/dL — ABNORMAL LOW (ref 1.7–2.4)

## 2021-08-30 LAB — TSH: TSH: 1.106 u[IU]/mL (ref 0.320–4.118)

## 2021-08-30 MED ORDER — SODIUM CHLORIDE 0.9 % IV SOLN
10.0000 mg | Freq: Once | INTRAVENOUS | Status: AC
  Administered 2021-08-30: 10 mg via INTRAVENOUS
  Filled 2021-08-30: qty 1
  Filled 2021-08-30: qty 10

## 2021-08-30 MED ORDER — SODIUM CHLORIDE 0.9% FLUSH
10.0000 mL | INTRAVENOUS | Status: DC | PRN
  Administered 2021-08-30: 10 mL

## 2021-08-30 MED ORDER — SODIUM CHLORIDE 0.9 % IV SOLN: Freq: Once | INTRAVENOUS | Status: AC

## 2021-08-30 MED ORDER — SODIUM CHLORIDE 0.9 % IV SOLN
200.0000 mg | Freq: Once | INTRAVENOUS | Status: AC
  Administered 2021-08-30: 200 mg via INTRAVENOUS
  Filled 2021-08-30: qty 8

## 2021-08-30 MED ORDER — PALONOSETRON HCL INJECTION 0.25 MG/5ML
0.2500 mg | Freq: Once | INTRAVENOUS | Status: AC
  Administered 2021-08-30: 0.25 mg via INTRAVENOUS
  Filled 2021-08-30: qty 5

## 2021-08-30 MED ORDER — SODIUM CHLORIDE 0.9 % IV SOLN
50.0000 mg/m2 | Freq: Once | INTRAVENOUS | Status: AC
  Administered 2021-08-30: 78 mg via INTRAVENOUS
  Filled 2021-08-30: qty 13

## 2021-08-30 MED ORDER — FAMOTIDINE 20 MG IN NS 100 ML IVPB
20.0000 mg | Freq: Once | INTRAVENOUS | Status: AC
  Administered 2021-08-30: 20 mg via INTRAVENOUS
  Filled 2021-08-30: qty 100

## 2021-08-30 MED ORDER — SODIUM CHLORIDE 0.9% FLUSH
10.0000 mL | Freq: Once | INTRAVENOUS | Status: AC
Start: 2021-08-30 — End: 2021-08-30
  Administered 2021-08-30: 10 mL

## 2021-08-30 MED ORDER — SODIUM CHLORIDE 0.9 % IV SOLN: Freq: Once | INTRAVENOUS | Status: DC

## 2021-08-30 MED ORDER — DIPHENHYDRAMINE HCL 50 MG/ML IJ SOLN
50.0000 mg | Freq: Once | INTRAMUSCULAR | Status: AC
Start: 2021-08-30 — End: 2021-08-30
  Administered 2021-08-30: 50 mg via INTRAVENOUS
  Filled 2021-08-30: qty 1

## 2021-08-30 MED ORDER — SODIUM CHLORIDE 0.9 % IV SOLN
111.0000 mg | Freq: Once | INTRAVENOUS | Status: AC
  Administered 2021-08-30: 110 mg via INTRAVENOUS
  Filled 2021-08-30: qty 11

## 2021-08-30 MED ORDER — HEPARIN SOD (PORK) LOCK FLUSH 100 UNIT/ML IV SOLN
500.0000 [IU] | Freq: Once | INTRAVENOUS | Status: AC | PRN
  Administered 2021-08-30: 500 [IU]

## 2021-08-30 NOTE — Assessment & Plan Note (Addendum)
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   He had 2 or 3-week delay in chemotherapy given active COVID infection.  He is here for follow-up.  He appears to have recovered from his COVID infection.  He denies any fevers or chills or worsening shortness of breath.  He continues to take pain medication every 8 hours. No change in bowel habits or urinary habits.  Okay to proceed with chemotherapy if labs are adequate.  We will repeat CT imaging in January to assess response.

## 2021-08-30 NOTE — Progress Notes (Signed)
Symptom Management Consult note [place]    Patient Care Team: [name], MD as PCP - General (Internal Medicine) Izora Gala, MD as Consulting Physician (Otolaryngology) [name], MD as Attending Physician (Radiation Oncology) Leota Sauers, RN (Inactive) as Oncology Nurse Navigator Karie Mainland, RD as Dietitian (Nutrition) Valentino Saxon [place], Ravinia as Speech Language Pathologist (Speech Pathology) Kennith Center, LCSW as Social Worker [name], MD as Consulting Physician (Hematology and Oncology) Mal[place]elt, Stephani Police, RN as Oncology Nurse Navigator    Name of the [name]  [id number]  June 12, 1951   Date of visit: 08/30/2021    Chief complaint/ Reason for visit- f/u test results  Oncology History Overview Note  Recurrent SCC, p16 neg   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 SEE NOTE   04/01/2019 Surgery   Pre-Op Dx:   right supraglottic carcinoma   Post-op Dx: T2 N0 M0 (stage II) right supraglottic carcinoma   Proc: Direct laryngoscopy with biopsy.  Cervical esophagoscopy.  Bronchoscopy.   Surg:  [name]    Findings: A bulky necrotic and semi-pedunculated tumor coming off the lateral surface of the right aryepiglottic fold and the anterior and lateral aspect of the piriform sinus on the right side.  Airway was compromised by the tumor mass at intubation.   04/11/2019 Imaging   CT neck: IMPRESSION: The glottis is closed with suboptimal evaluation of the vocal cords. No asymmetry of the cords. Correlate  with laryngoscopy results.   No enlarged lymph nodes in the neck. 7 mm right level 2 lymph node may be reactive. No definite pathologic lymph nodes in the neck.   04/11/2019 Imaging   CT neck  This CT was performed after surgical debulking of an exophytic laryngeal tumor.   Most apparent on series 2, image 51 there is abnormal asymmetric hyperenhancement along the surface of the right piriform sinus, and perhaps also involving the bilateral AE folds.   04/22/2019 Imaging   PET: IMPRESSION: 1. Mild residual activity in the posterior RIGHT hypopharynx confined to the mucosa. 2. No evidence of hypermetabolic metastatic lymph nodes in LEFT or RIGHT neck. 3. No evidence distant metastatic disease.   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 New FDG avid right level 2 cervical lymph node concerning for recurrent disease. No findings of distant metastatic disease within the chest, abdomen or pelvis. 2. Mild nonspecific increased uptake in the right side of larynx is similar to previous exam. 3. Aortic Atherosclerosis (ICD10-I70.0). Coronary artery calcifications.    11/21/2019 Pathology Results   SURGICAL PATHOLOGY  CASE: [id number]  PATIENT:  Rory Lando  Surgical Pathology Report   FINAL MICROSCOPIC DIAGNOSIS:   A. LYMPH NODE, RIGHT CERVICAL:  - Poorly differentiated carcinoma consistent with metastatic squamous cell carcinoma.  - See comment   01/06/2020 Pathology Results   SURGICAL PATHOLOGY  CASE:  [id number]  PATIENT: Cortlandt Beste  Surgical Pathology Report   Clinical History: metastatic cancer to cervical lymph nodes (cm)   FINAL MICROSCOPIC DIAGNOSIS:   A. LYMPH NODE, RIGHT MODIFIED, DISSECTION:  - Metastatic squamous cell carcinoma in four of nineteen lymph nodes (4/19).  - Largest metastatic nodule is 3.1 cm with 5 mm extra-nodal extension.  - Lymphovascular space involvement in peri-nodal connective tissue.  - See comment.    01/06/2020 Surgery   PRE-OPERATIVE DIAGNOSIS:  METASTATIC CANCER TO CERVICAL LYMPH NODES   POST-OPERATIVE DIAGNOSIS:  METASTATIC CANCER TO CERVICAL LYMPH NODES   PROCEDURE:  Procedure(s): DIRECT LARYNGOSCOPY MODIFIED RADICAL NECK DISSECTION   SURGEON:  [name], MD     SPECIMEN: Right modified neck dissection including levels 2 and 3, including the internal jugular vein, suture marks the inferior jugular vein stump.   07/05/2020 Imaging   1. Sequelae of radiation and right neck dissection from the previous contrasted Neck CT.   2. Asymmetric soft tissue thickening and enhancement along the right lateral pharynx. Although this might be asymmetric mucositis, the appearance is suspicious and recommend direct inspection. NI-RADS category 2a.   3. Suspicious small 6 mm hyperenhancing nodular soft tissue along the posterior margin of the neck dissection at right level 3. NI-RADS category 2 vs 3 - although this is likely too small for imaging guided biopsy. Repeat PET-CT may be most valuable.   4. Mild inflammatory appearing right upper lobe centrilobular ground-glass opacity, new since February. Consider mild or developing right upper lobe infection. Post radiation changes to the lung apices.       08/13/2020 Pathology Results   A. OROPHARNYX, BIOPSY:  - Poorly differentiated squamous cell carcinoma.  - See comment.    08/13/2020 Surgery   POST-OPERATIVE DIAGNOSIS:  Tonsillary Mass History of Laryngeal  Cancer   PROCEDURE:   Procedure(s): DIRECT LARYNGOSCOPY BIOPSY OF OROPHARYNGEAL MASS   08/30/2020 PET scan   1. Recurrence of head neck carcinoma with a broad hypermetabolic pharyngeal mucosal lesion involving the RIGHT lateral posterior oropharynx and hypopharynx extending from the palatine tonsil to the vallecula. 2. Hypermetabolic lymph node posterior sternocleidomastoid muscle on the RIGHT (level 3). 3. Hypermetabolic nodule within the LEFT parotid glands favored small primary parotid neoplasm. 4. No evidence thoracic metastasis   09/24/2020 - 10/30/2020 Chemotherapy   He received weekly cisplatin   01/31/2021 PET scan   Findings of worsening nodal disease with a node or group of nodes in the LEFT neck and a RIGHT supraclavicular lymph node as discussed.   Post treatment changes with soft tissue fullness in the area of the prior tumor but with markedly diminished metabolic activity.     03/11/2021 Imaging   Ct neck  Diffuse pharyngeal and laryngeal mucosal and submucosal edema probably related to acute radiation injury. The airway is narrow and could possibly be critically narrowed. I do not see any evidence of recurrent mucosal or submucosal tumor identifiable.   Similar appearance of the recurrent malignant lymphadenopathy on the left at level 2 and level 3 and on the right in the low supraclavicular region just above and behind the clavicular head.   04/12/2021 Imaging   Ct abdomen and pelvis  1. Contrast material in the peritoneal cavity  of the upper abdomen suggesting leak along the gastrostomy tract. 2. Gastrostomy tube is appropriately positioned. No evidence of abscess. 3. 1.3 cm right middle lobe nodule, new since 01/31/2021. Favor infectious/inflammatory over primary/secondary neoplasm given the relatively short time frame of development.   04/12/2021 Surgery   Postoperative Diagnosis: Perforated duodenal ulcer (1st portion)   Surgical Procedure:  Modified [place] patch of duodenal  ulcer Gastrostomy tube exchange   Operative Team Members:  Surgeon(s) and Role:    * Stechschulte, Nickola Major, MD - Primary     Drains:  (19 Fr) Jackson-Pratt drain(s) with closed bulb suction in the right upper quadrant near the duodenal ulcer repair and Gastrostomy Tube 26 fr   05/09/2021 Imaging   1. Progression of lesion in the right tonsil with central fluid collection and peripheral enhancement. This was a site of prior recurrent sick could represent progressive tumor or abscess. Continued follow-up recommended. 2. Progressive malignant lymphadenopathy in the left neck. Stable lymphadenopathy right supraclavicular region. 3. New area of stranding in the superior segment left lower lobe but certain etiology. Attention on follow-up recommended.     06/13/2021 -  Chemotherapy   Patient is on Treatment Plan :  Paclitaxel + Carboplatin q7d     06/13/2021 -  Chemotherapy   Patient is on Treatment Plan : HEAD/NECK Pembrolizumab Q21D     07/18/2021 Imaging   Ct chest 1. No highly specific findings identified to suggest pulmonary or nodal metastasis within the chest 2. Multifocal patchy areas of ground-glass and nodular airspace densities. Surrounding postinflammatory changes are noted with thickening of the peribronchovascular interstitium. Imaging findings are concerning for sequelae of aspiration and/or multifocal infection. Follow-up imaging after appropriate antibiotic therapy is recommended with repeat CT of the chest in 3 months to ensure resolution and to rule out the possibility of underlying metastatic nodule. 3. Coronary artery calcifications. 4. Aortic Atherosclerosis (ICD10-I70.0) and Emphysema (ICD10-J43.9).   07/18/2021 Imaging   CT neck  1. Positive treatment response with decreased size of nodal metastases in the left jugular chain. There is likely residual active tumor in the treated nodes. 2. Ulcerated lesions in the bilateral palatine fossa, progressed on the left and  suspicious for synchronous tumors.   07/23/2021 Cancer Staging   Staging form: Larynx - Supraglottis, AJCC 8th Edition - Clinical stage from 07/23/2021: Stage IVA (rcT2, cN2b, cM0) - Signed by [name], MD on 07/23/2021 Stage prefix: Recurrence      Current Therapy: weekly carboplatin and paclitaxel and every 3-week Keytruda last treatment 08/05/21 Last dose of Keytruda was given 07/25/2021  Interval history  He had active COVID diagnosed end of November and treatment was interrupted because he felt poorly and since he had active COVID.  He is returning today to be considered for restarting chemoimmunotherapy. Fever, sore throat, loss of appetite got better, gained about 6 lbs since last visit. No change in breathing. Slight dizziness that has not been bothersome. mild tingling and numbness in his hands, but are still able to function well. He doesn't want taxol dose reduced since its not very bothersome. Taking pain medication thrice a day Mild diarrhea, once a day, doesn't have to take medications. No changes in urination. No headaches, falls. Rest of the pertinent 10 point ROS reviewed and negative.  No Known Allergies   Past Medical History:  Diagnosis Date   Cancer ([place])    Throat cancer 2019   ETOH abuse    Frequent urination    Glaucoma    Hepatitis  C virus infection without hepatic coma    dx'ed in 11/2018   History of radiation therapy 05/12/19- 07/06/19   Larynx   Hypertension    Wears denture    upper only; lost lower denture     Past Surgical History:  Procedure Laterality Date   ANKLE SURGERY  2011   right ankle   COLONOSCOPY  02/2019   polyps - Dr Havery Moros   DIRECT LARYNGOSCOPY N/A 04/01/2019   Procedure: DIRECT LARYNGOSCOPY WITH BIOPSY;  Surgeon: Jodi Marble, MD;  Location: [facility];  Service: ENT;  Laterality: N/A;   DIRECT LARYNGOSCOPY N/A 01/06/2020   Procedure: DIRECT LARYNGOSCOPY;  Surgeon: Izora Gala, MD;  Location: [place];  Service: ENT;   Laterality: N/A;   DIRECT LARYNGOSCOPY N/A 08/13/2020   Procedure: DIRECT LARYNGOSCOPY;  Surgeon: Izora Gala, MD;  Location: [place];  Service: ENT;  Laterality: N/A;   ESOPHAGOSCOPY N/A 04/01/2019   Procedure: ESOPHAGOSCOPY;  Surgeon: Jodi Marble, MD;  Location: [place];  Service: ENT;  Laterality: N/A;   EXCISION ORAL TUMOR Right 08/13/2020   Procedure: BIOPSY OF OROPHARYNGEAL MASS;  Surgeon: Izora Gala, MD;  Location: [place];  Service: ENT;  Laterality: Right;   EYE SURGERY Right    IR CM INJ ANY COLONIC TUBE W/FLUORO  12/20/2020   IR CM INJ ANY COLONIC TUBE W/FLUORO  03/20/2021   IR GASTROSTOMY TUBE MOD SED  09/24/2020   IR IMAGING GUIDED PORT INSERTION  09/24/2020   IR RADIOLOGIST EVAL & MGMT  07/02/2021   KNEE SURGERY     LAPAROTOMY N/A 04/12/2021   Procedure: EXPLORATORY LAPAROTOMY, REPAIR OF DUODENAL ULCER WITH Silvestre Gunner;  Surgeon: Felicie Morn, MD;  Location: [facility];  Service: General;  Laterality: N/A;   LARYNGOSCOPY AND BRONCHOSCOPY N/A 04/01/2019   Procedure: BRONCHOSCOPY;  Surgeon: Jodi Marble, MD;  Location: [place];  Service: ENT;  Laterality: N/A;   RADICAL NECK DISSECTION N/A 01/06/2020   Procedure: RADICAL NECK DISSECTION;  Surgeon: Izora Gala, MD;  Location: [facility] OR;  Service: ENT;  Laterality: N/A;    Social History   Socioeconomic History   Marital status: Single    Spouse name: Not on file   Number of children: 2   Years of education: Not on file   Highest education level: Not on file  Occupational History   Not on file  Tobacco Use   Smoking status: Former    Years: 50.00    Types: Cigarettes    Quit date: 06/01/2020    Years since quitting: 1.2   Smokeless tobacco: Never  Vaping Use   Vaping Use: Never used  Substance and Sexual Activity   Alcohol use: Yes    Alcohol/week: 4.0 standard drinks    Types: 4 Cans of beer per week    Comment: 40oz beer daily   Drug use: Yes    Types: Cocaine    Comment: none  in 2 yrs   Sexual activity: Yes    Partners: Female  Other Topics Concern   Not on file  Social History Narrative   Patient is divorced with 2 children.   Patient is currently living with his sister.   Patient with a history of smoking a third of pack of cigarettes daily for 50 years.  Patient currently smoking 2 to 3 cigarettes/day.   Patient has never used smokeless tobacco.   Patient with occasional use of alcohol.   Patient last used cocaine approximately 6 months ago.  Patient denies use of marijuana.   Social Determinants of Health   Financial Resource Strain: Not on file  Food Insecurity: No Food Insecurity   Worried About Charity fundraiser in the Last Year: Never true   Ran Out of Food in the Last Year: Never true  Transportation Needs: No Transportation Needs   Lack of Transportation (Medical): No   Lack of Transportation (Non-Medical): No  Physical Activity: Not on file  Stress: Not on file  Social Connections: Unknown   Frequency of Communication with Friends and Family: Three times a week   Frequency of Social Gatherings with Friends and Family: More than three times a week   Attends Religious Services: Not on file   Active Member of Clubs or Organizations: Not on file   Attends Archivist Meetings: Not on file   Marital Status: Not on file  Intimate Partner Violence: Not on file    Family History  Problem Relation Age of Onset   Breast cancer Sister    Colon cancer Brother 60       ????   Cancer Brother      Current Outpatient Medications:    atropine 1 % ophthalmic solution, Place 1 drop into the right eye 2 (two) times a day. , Disp: , Rfl:    lidocaine-prilocaine (EMLA) cream, Apply to affected area once, Disp: 30 g, Rfl: 3   magnesium oxide (MAG-OX) 400 (240 Mg) MG tablet, Take 1 tablet (400 mg total) by mouth daily., Disp: 30 tablet, Rfl: 1   Nutritional Supplements ([employer] STANDARD 1.4) LIQD, 487 mLs by Enteral route 3 (three) times  daily. 1.5 cartons (487 ml) [employer] 1.4 TID with 60 ml free water before and 60 ml free water after each TF bolus. Provide an additional 120 ml free water TID., Disp: , Rfl:    nystatin (MYCOSTATIN) 100000 UNIT/ML suspension, Take 5 mLs (500,000 Units total) by mouth 4 (four) times daily., Disp: 60 mL, Rfl: 0   ondansetron (ZOFRAN) 8 MG tablet, Take 1 tablet (8 mg total) by mouth 2 (two) times daily as needed for refractory nausea / vomiting. Start on day 3 after chemo., Disp: 30 tablet, Rfl: 1   oxyCODONE (OXY IR/ROXICODONE) 5 MG immediate release tablet, Take 1 tablet (5 mg total) by mouth every 6 (six) hours as needed for severe pain., Disp: 90 tablet, Rfl: 0   prednisoLONE acetate (PRED FORTE) 1 % ophthalmic suspension, Place 1 drop into the right eye 4 (four) times daily., Disp: , Rfl:    prochlorperazine (COMPAZINE) 10 MG tablet, Take 1 tablet (10 mg total) by mouth every 6 (six) hours as needed (Nausea or vomiting)., Disp: 30 tablet, Rfl: 1   Vitamin D, Ergocalciferol, (DRISDOL) 1.25 MG (50000 UNIT) CAPS capsule, Take 50,000 Units by mouth every Saturday., Disp: , Rfl:    Water For Irrigation, Sterile (FREE WATER) SOLN, Place 100 mLs into feeding tube every 4 (four) hours., Disp: , Rfl:   PHYSICAL EXAM: ECOG FS:2 - Symptomatic, <50% confined to bed   Physical Exam Constitutional:      Appearance: Normal appearance.     Comments: Cachectic   HENT:     Head: Normocephalic and atraumatic.  Neck:     Comments: Again he has postradiation changes in his neck exam is hard to appreciate.  He however does not have any massive lymphadenopathy. Cardiovascular:     Rate and Rhythm: Normal rate and regular rhythm.  Pulmonary:  Effort: Pulmonary effort is normal.     Breath sounds: Normal breath sounds.  Abdominal:     General: Abdomen is flat.     Palpations: Abdomen is soft.  Musculoskeletal:        General: No swelling or tenderness.     Cervical back: Normal range of motion and  neck supple. No rigidity.  Lymphadenopathy:     Cervical: No cervical adenopathy.  Skin:    General: Skin is warm and dry.  Neurological:     General: No focal deficit present.     Mental Status: He is alert.  Psychiatric:        Mood and Affect: Mood normal.     LABORATORY DATA: I have reviewed the data as listed CBC Latest Ref Rng & Units 08/30/2021 08/12/2021 08/05/2021  WBC 4.0 - 10.5 K/uL 5.1 4.1 2.7(L)  Hemoglobin 13.0 - 17.0 g/dL 9.3(L) 10.2(L) 9.4(L)  Hematocrit 39.0 - 52.0 % 28.8(L) 30.8(L) 29.3(L)  Platelets 150 - 400 K/uL 261 197 214     CMP Latest Ref Rng & Units 08/30/2021 08/12/2021 08/05/2021  Glucose 70 - 99 mg/dL 104(H) 133(H) 85  BUN 8 - 23 mg/dL 18 18 5(L)  Creatinine 0.61 - 1.24 mg/dL 0.60(L) 0.70 0.59(L)  Sodium 135 - 145 mmol/L 139 134(L) 137  Potassium 3.5 - 5.1 mmol/L 4.2 4.3 3.7  Chloride 98 - 111 mmol/L 104 96(L) 99  CO2 22 - 32 mmol/L 28 28 29   Calcium 8.9 - 10.3 mg/dL 8.6(L) 8.8(L) 9.0  Total Protein 6.5 - 8.1 g/dL 7.0 7.4 7.1  Total Bilirubin 0.3 - 1.2 mg/dL 0.3 0.3 0.4  Alkaline Phos 38 - 126 U/L 54 41 50  AST 15 - 41 U/L 13(L) 18 16  ALT 0 - 44 U/L 7 11 11     RADIOGRAPHIC STUDIES:  I have personally reviewed the radiological images as listed and agreed with the findings in the report. No images are attached to the encounter.  No results found.   ASSESSMENT & PLAN:  Patient is a 70 y.o. male with history of squamous cell carcinoma of right supraglottis on weekly carbotaxol and every 21-day Keytruda.  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 Zeien is a patient with complicated history of supraglottic tumor with cancer recurrence in the head and neck region currently on weekly CarboTaxol and every 21-day Keytruda.   He had 2 or 3-week delay in chemotherapy given active COVID infection.  He is here for follow-up.  He appears to have recovered from his COVID infection.  He denies any fevers or chills or worsening  shortness of breath.  He continues to take pain medication every 8 hours. No change in bowel habits or urinary habits.  Okay to proceed with chemotherapy if labs are adequate.  We will repeat CT imaging in January to assess response.    Neuropathy due to chemotherapeutic drug [facility]) He continues to have mild neuropathy in his fingers.  This has not been limiting him from doing any activities.  He refuses to discontinue chemotherapy with Taxol since he believes the neuropathy is really mild.  We will continue to monitor this at this time.  Cancer related pain He continues on oxycodone every 8 hours.  His pain needs have remained stable.  He understands the narcotic refill policy.  Protein-calorie malnutrition, severe Weight has improved 6 pounds since his last visit.   Encourage G-tube feedings and oral intake as tolerated      I spent 30  minutes in the care of this patient including history, physical exam, review of records, counseling and coordination of care Thank you for allowing me to participate in the care of this patient.    [name], MD Department of Hematology/Oncology [facility] at [facility] Phone: [phone]  [phone]    08/30/2021 12:11 PM

## 2021-08-30 NOTE — Assessment & Plan Note (Signed)
He continues to have mild neuropathy in his fingers.  This has not been limiting him from doing any activities.  He refuses to discontinue chemotherapy with Taxol since he believes the neuropathy is really mild.  We will continue to monitor this at this time.

## 2021-08-30 NOTE — Assessment & Plan Note (Signed)
Weight has improved 6 pounds since his last visit.   Encourage G-tube feedings and oral intake as tolerated

## 2021-08-30 NOTE — Progress Notes (Signed)
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 Now with progression. He is currently receiving CarboTaxol and Keytruda.   Patient tested positive for COVID 11/29, last chemoimmunotherapy scheduled 12/6 held.   Met with patient during infusion. He reports feeling poorly for 2 weeks with fever, congestion, nausea, vomiting, diarrhea secondary to Covid infection. Patient reports symptoms have resolved, but easily fatigued. Patient reports he was giving 2 cartons of [employer] 1.4 while sick. He has not increased tube feedings. Patient reports a few episodes of vomiting after bolus feedings. He relates this to laying down directly afterwards. Patient reports trying chicken noodle soup orally, but "did not work out well" Patient is NPO per SLP pending repeat MBS at [facility].   Medications: reviewed   Labs: Glucose 104, Mg 1.6  Anthropometrics: Weight 106 lb 14.4 oz today increased from 101 lb 8 oz on 11/28  10/27 - 103 lb 6 oz 10/20 - 108 lb 11.2 oz  10/13 - 108 lb 14.4 oz 9/29 - 108 lb 4 oz   NUTRITION DIAGNOSIS: Severe malnutrition continues   INTERVENTION:  Increase [employer] 1.4 as tolerated to goal of 4 cartons/day to promote weight gain - pt agreeable  Reviewed importance of sitting up ~45 minutes after bolus feedings, pt understanding [employer] contacted on behalf of pt to request shipment of formula and supplies, contact information provided to pt as well as caregiver Verdene Lennert) for future needs Patient has contact information   MONITORING, EVALUATION, GOAL: weight trends, tube feeding   NEXT VISIT: Friday January 27 during infusion

## 2021-08-30 NOTE — Patient Instructions (Signed)
[facility]  Discharge Instructions: Thank you for choosing [name] to provide your oncology and hematology care.   If you have a lab appointment with the [facility], please go directly to the [facility] and check in at the registration area.   Wear comfortable clothing and clothing appropriate for easy access to any Portacath or PICC line.   We strive to give you quality time with your provider. You may need to reschedule your appointment if you arrive late (15 or more minutes).  Arriving late affects you and other patients whose appointments are after yours.  Also, if you miss three or more appointments without notifying the office, you may be dismissed from the clinic at the provider's discretion.      For prescription refill requests, have your pharmacy contact our office and allow 72 hours for refills to be completed.    Today you received the following chemotherapy and/or immunotherapy agents: Keytruda/Taxol/Carbo.      To help prevent nausea and vomiting after your treatment, we encourage you to take your nausea medication as directed.  BELOW ARE SYMPTOMS THAT SHOULD BE REPORTED IMMEDIATELY: . *FEVER GREATER THAN 100.4 F (38 C) OR HIGHER . *CHILLS OR SWEATING . *NAUSEA AND VOMITING THAT IS NOT CONTROLLED WITH YOUR NAUSEA MEDICATION . *UNUSUAL SHORTNESS OF BREATH . *UNUSUAL BRUISING OR BLEEDING . *URINARY PROBLEMS (pain or burning when urinating, or frequent urination) . *BOWEL PROBLEMS (unusual diarrhea, constipation, pain near the anus) . TENDERNESS IN MOUTH AND THROAT WITH OR WITHOUT PRESENCE OF ULCERS (sore throat, sores in mouth, or a toothache) . UNUSUAL RASH, SWELLING OR PAIN  . UNUSUAL VAGINAL DISCHARGE OR ITCHING   Items with * indicate a potential emergency and should be followed up as soon as possible or go to the Emergency Department if any problems should occur.  Please show the CHEMOTHERAPY ALERT CARD or  IMMUNOTHERAPY ALERT CARD at check-in to the Emergency Department and triage nurse.  Should you have questions after your visit or need to cancel or reschedule your appointment, please contact [facility]  Dept: [phone]  and follow the prompts.  Office hours are 8:00 a.m. to 4:30 p.m. Monday - Friday. Please note that voicemails left after 4:00 p.m. may not be returned until the following business day.  We are closed weekends and major holidays. You have access to a nurse at all times for urgent questions. Please call the main number to the clinic Dept: [phone] and follow the prompts.   For any non-urgent questions, you may also contact your provider using MyChart. We now offer e-Visits for anyone 18 and older to request care online for non-urgent symptoms. For details visit [email or web address].   Also download the MyChart app! Go to the app store, search "MyChart", open the app, select [facility], and log in with your MyChart username and password.  Due to Covid, a mask is required upon entering the hospital/clinic. If you do not have a mask, one will be given to you upon arrival. For doctor visits, patients may have 1 support person aged 18 or older with them. For treatment visits, patients cannot have anyone with them due to current Covid guidelines and our immunocompromised population.   

## 2021-08-30 NOTE — Assessment & Plan Note (Signed)
He continues on oxycodone every 8 hours.  His pain needs have remained stable.  He understands the narcotic refill policy.

## 2021-09-01 ENCOUNTER — Other Ambulatory Visit: Payer: Self-pay

## 2021-09-05 ENCOUNTER — Ambulatory Visit: Payer: Medicare HMO

## 2021-09-05 ENCOUNTER — Other Ambulatory Visit: Payer: Medicare HMO

## 2021-09-19 ENCOUNTER — Other Ambulatory Visit: Payer: Self-pay

## 2021-09-19 ENCOUNTER — Ambulatory Visit (HOSPITAL_COMMUNITY)
Admission: RE | Admit: 2021-09-19 | Discharge: 2021-09-19 | Disposition: A | Discharge status: Home / Self Care | Payer: Medicare HMO | Source: Ambulatory Visit | Attending: Hematology and Oncology | Admitting: Hematology and Oncology

## 2021-09-19 DIAGNOSIS — C321 Malignant neoplasm of supraglottis: Secondary | ICD-10-CM | POA: Diagnosis not present

## 2021-09-19 MED ORDER — IOHEXOL 350 MG/ML SOLN
60.0000 mL | Freq: Once | INTRAVENOUS | Status: AC | PRN
  Administered 2021-09-19: 60 mL via INTRAVENOUS

## 2021-09-19 MED ORDER — SODIUM CHLORIDE (PF) 0.9 % IJ SOLN
INTRAMUSCULAR | Status: AC
  Filled 2021-09-19: qty 50

## 2021-09-20 ENCOUNTER — Other Ambulatory Visit: Payer: Self-pay | Admitting: Hematology and Oncology

## 2021-09-20 ENCOUNTER — Telehealth: Payer: Self-pay | Admitting: Hematology and Oncology

## 2021-09-20 ENCOUNTER — Other Ambulatory Visit: Payer: Self-pay | Admitting: *Deleted

## 2021-09-20 ENCOUNTER — Inpatient Hospital Stay (HOSPITAL_BASED_OUTPATIENT_CLINIC_OR_DEPARTMENT_OTHER): Payer: Medicare HMO | Admitting: Hematology and Oncology

## 2021-09-20 ENCOUNTER — Inpatient Hospital Stay: Payer: Medicare HMO

## 2021-09-20 ENCOUNTER — Inpatient Hospital Stay: Payer: Medicare HMO | Attending: Hematology and Oncology

## 2021-09-20 VITALS — BP 128/61 | HR 102 | Temp 99.1°F | Resp 18 | Wt 106.4 lb

## 2021-09-20 DIAGNOSIS — Z5111 Encounter for antineoplastic chemotherapy: Secondary | ICD-10-CM | POA: Insufficient documentation

## 2021-09-20 DIAGNOSIS — J189 Pneumonia, unspecified organism: Secondary | ICD-10-CM | POA: Diagnosis not present

## 2021-09-20 DIAGNOSIS — F17210 Nicotine dependence, cigarettes, uncomplicated: Secondary | ICD-10-CM | POA: Insufficient documentation

## 2021-09-20 DIAGNOSIS — Z79899 Other long term (current) drug therapy: Secondary | ICD-10-CM | POA: Diagnosis not present

## 2021-09-20 DIAGNOSIS — C321 Malignant neoplasm of supraglottis: Secondary | ICD-10-CM

## 2021-09-20 DIAGNOSIS — G893 Neoplasm related pain (acute) (chronic): Secondary | ICD-10-CM

## 2021-09-20 DIAGNOSIS — C090 Malignant neoplasm of tonsillar fossa: Secondary | ICD-10-CM

## 2021-09-20 DIAGNOSIS — Z95828 Presence of other vascular implants and grafts: Secondary | ICD-10-CM

## 2021-09-20 LAB — CBC WITH DIFFERENTIAL/PLATELET
Abs Immature Granulocytes: 0.03 10*3/uL (ref 0.00–0.07)
Basophils Absolute: 0 10*3/uL (ref 0.0–0.1)
Basophils Relative: 1 %
Eosinophils Absolute: 0.1 10*3/uL (ref 0.0–0.5)
Eosinophils Relative: 2 %
HCT: 31.7 % — ABNORMAL LOW (ref 39.0–52.0)
Hemoglobin: 10.3 g/dL — ABNORMAL LOW (ref 13.0–17.0)
Immature Granulocytes: 1 %
Lymphocytes Relative: 22 %
Lymphs Abs: 0.8 10*3/uL (ref 0.7–4.0)
MCH: 32.8 pg (ref 26.0–34.0)
MCHC: 32.5 g/dL (ref 30.0–36.0)
MCV: 101 fL — ABNORMAL HIGH (ref 80.0–100.0)
Monocytes Absolute: 0.5 10*3/uL (ref 0.1–1.0)
Monocytes Relative: 15 %
Neutro Abs: 2.1 10*3/uL (ref 1.7–7.7)
Neutrophils Relative %: 59 %
Platelets: 259 10*3/uL (ref 150–400)
RBC: 3.14 MIL/uL — ABNORMAL LOW (ref 4.22–5.81)
RDW: 16.5 % — ABNORMAL HIGH (ref 11.5–15.5)
WBC: 3.5 10*3/uL — ABNORMAL LOW (ref 4.0–10.5)
nRBC: 0 % (ref 0.0–0.2)

## 2021-09-20 LAB — COMPREHENSIVE METABOLIC PANEL
ALT: 8 U/L (ref 0–44)
AST: 15 U/L (ref 15–41)
Albumin: 3.9 g/dL (ref 3.5–5.0)
Alkaline Phosphatase: 44 U/L (ref 38–126)
Anion gap: 7 (ref 5–15)
BUN: 17 mg/dL (ref 8–23)
CO2: 29 mmol/L (ref 22–32)
Calcium: 9.2 mg/dL (ref 8.9–10.3)
Chloride: 102 mmol/L (ref 98–111)
Creatinine, Ser: 0.45 mg/dL — ABNORMAL LOW (ref 0.61–1.24)
GFR, Estimated: >60 mL/min (ref >60)
Glucose, Bld: 118 mg/dL — ABNORMAL HIGH (ref 70–99)
Potassium: 4 mmol/L (ref 3.5–5.1)
Sodium: 138 mmol/L (ref 135–145)
Total Bilirubin: 0.4 mg/dL (ref 0.3–1.2)
Total Protein: 7.4 g/dL (ref 6.5–8.1)

## 2021-09-20 LAB — TSH: TSH: 1.996 u[IU]/mL (ref 0.320–4.118)

## 2021-09-20 MED ORDER — OXYCODONE HCL 5 MG PO TABS: 5.0000 mg | ORAL_TABLET | Freq: Four times a day (QID) | ORAL | 0 refills | Status: DC | PRN

## 2021-09-20 MED ORDER — SODIUM CHLORIDE 0.9% FLUSH
10.0000 mL | Freq: Once | INTRAVENOUS | Status: AC
  Administered 2021-09-20: 10 mL

## 2021-09-20 MED ORDER — PREDNISONE 10 MG PO TABS: 40.0000 mg | ORAL_TABLET | Freq: Every day | ORAL | 0 refills | Status: AC

## 2021-09-20 MED ORDER — AZITHROMYCIN 250 MG PO TABS: ORAL_TABLET | ORAL | 0 refills | Status: DC

## 2021-09-20 NOTE — Telephone Encounter (Signed)
Sch per 1/6 los, left msg with sister

## 2021-09-20 NOTE — Progress Notes (Signed)
Symptom Management Consult note [place]    Patient Care Team: [name], MD as PCP - General (Internal Medicine) Izora Gala, MD as Consulting Physician (Otolaryngology) [name], MD as Attending Physician (Radiation Oncology) Leota Sauers, RN (Inactive) as Oncology Nurse Navigator Karie Mainland, RD as Dietitian (Nutrition) Valentino Saxon [place], [place] as Speech Language Pathologist (Speech Pathology) Kennith Center, LCSW as Social Worker [name], MD as Consulting Physician (Hematology and Oncology) Mal[place], Stephani Police, RN as Oncology Nurse Navigator    Name of the [name]  [id number]  25-Nov-1950   Date of visit: 09/20/2021    Chief complaint/ Reason for visit- f/u test results  Oncology History Overview Note  Recurrent SCC, p16 neg   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 SEE NOTE   04/01/2019 Surgery   Pre-Op Dx:   right supraglottic carcinoma   Post-op Dx: T2 N0 M0 (stage II) right supraglottic carcinoma   Proc: Direct laryngoscopy with biopsy.  Cervical esophagoscopy.  Bronchoscopy.   Surg:  [name]    Findings: A bulky necrotic and semi-pedunculated tumor coming off the lateral surface of the right aryepiglottic fold and the anterior and lateral aspect of the piriform sinus on the right side.  Airway was compromised by the tumor mass at intubation.   04/11/2019 Imaging   CT neck: IMPRESSION: The glottis is closed with suboptimal evaluation of the vocal cords. No asymmetry of the cords. Correlate  with laryngoscopy results.   No enlarged lymph nodes in the neck. 7 mm right level 2 lymph node may be reactive. No definite pathologic lymph nodes in the neck.   04/11/2019 Imaging   CT neck  This CT was performed after surgical debulking of an exophytic laryngeal tumor.   Most apparent on series 2, image 51 there is abnormal asymmetric hyperenhancement along the surface of the right piriform sinus, and perhaps also involving the bilateral AE folds.   04/22/2019 Imaging   PET: IMPRESSION: 1. Mild residual activity in the posterior RIGHT hypopharynx confined to the mucosa. 2. No evidence of hypermetabolic metastatic lymph nodes in LEFT or RIGHT neck. 3. No evidence distant metastatic disease.   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 New FDG avid right level 2 cervical lymph node concerning for recurrent disease. No findings of distant metastatic disease within the chest, abdomen or pelvis. 2. Mild nonspecific increased uptake in the right side of larynx is similar to previous exam. 3. Aortic Atherosclerosis (ICD10-I70.0). Coronary artery calcifications.    11/21/2019 Pathology Results   SURGICAL PATHOLOGY  CASE: [id number]  PATIENT:  [name]  Surgical Pathology Report   FINAL MICROSCOPIC DIAGNOSIS:   A. LYMPH NODE, RIGHT CERVICAL:  - Poorly differentiated carcinoma consistent with metastatic squamous cell carcinoma.  - See comment   01/06/2020 Pathology Results   SURGICAL PATHOLOGY  CASE:  [id number]  [name]  Surgical Pathology Report   Clinical History: metastatic cancer to cervical lymph nodes (cm)   FINAL MICROSCOPIC DIAGNOSIS:   A. LYMPH NODE, RIGHT MODIFIED, DISSECTION:  - Metastatic squamous cell carcinoma in four of nineteen lymph nodes (4/19).  - Largest metastatic nodule is 3.1 cm with 5 mm extra-nodal extension.  - Lymphovascular space involvement in peri-nodal connective tissue.  - See comment.    01/06/2020 Surgery   PRE-OPERATIVE DIAGNOSIS:  METASTATIC CANCER TO CERVICAL LYMPH NODES   POST-OPERATIVE DIAGNOSIS:  METASTATIC CANCER TO CERVICAL LYMPH NODES   PROCEDURE:  Procedure(s): DIRECT LARYNGOSCOPY MODIFIED RADICAL NECK DISSECTION   SURGEON:  [name], MD     SPECIMEN: Right modified neck dissection including levels 2 and 3, including the internal jugular vein, suture marks the inferior jugular vein stump.   07/05/2020 Imaging   1. Sequelae of radiation and right neck dissection from the previous contrasted Neck CT.   2. Asymmetric soft tissue thickening and enhancement along the right lateral pharynx. Although this might be asymmetric mucositis, the appearance is suspicious and recommend direct inspection. NI-RADS category 2a.   3. Suspicious small 6 mm hyperenhancing nodular soft tissue along the posterior margin of the neck dissection at right level 3. NI-RADS category 2 vs 3 - although this is likely too small for imaging guided biopsy. Repeat PET-CT may be most valuable.   4. Mild inflammatory appearing right upper lobe centrilobular ground-glass opacity, new since February. Consider mild or developing right upper lobe infection. Post radiation changes to the lung apices.       08/13/2020 Pathology Results   A. OROPHARNYX, BIOPSY:  - Poorly differentiated squamous cell carcinoma.  - See comment.    08/13/2020 Surgery   POST-OPERATIVE DIAGNOSIS:  Tonsillary Mass History of Laryngeal  Cancer   PROCEDURE:   Procedure(s): DIRECT LARYNGOSCOPY BIOPSY OF OROPHARYNGEAL MASS   08/30/2020 PET scan   1. Recurrence of head neck carcinoma with a broad hypermetabolic pharyngeal mucosal lesion involving the RIGHT lateral posterior oropharynx and hypopharynx extending from the palatine tonsil to the vallecula. 2. Hypermetabolic lymph node posterior sternocleidomastoid muscle on the RIGHT (level 3). 3. Hypermetabolic nodule within the LEFT parotid glands favored small primary parotid neoplasm. 4. No evidence thoracic metastasis   09/24/2020 - 10/30/2020 Chemotherapy   He received weekly cisplatin   01/31/2021 PET scan   Findings of worsening nodal disease with a node or group of nodes in the LEFT neck and a RIGHT supraclavicular lymph node as discussed.   Post treatment changes with soft tissue fullness in the area of the prior tumor but with markedly diminished metabolic activity.     03/11/2021 Imaging   Ct neck  Diffuse pharyngeal and laryngeal mucosal and submucosal edema probably related to acute radiation injury. The airway is narrow and could possibly be critically narrowed. I do not see any evidence of recurrent mucosal or submucosal tumor identifiable.   Similar appearance of the recurrent malignant lymphadenopathy on the left at level 2 and level 3 and on the right in the low supraclavicular region just above and behind the clavicular head.   04/12/2021 Imaging   Ct abdomen and pelvis  1. Contrast material in the peritoneal cavity  of the upper abdomen suggesting leak along the gastrostomy tract. 2. Gastrostomy tube is appropriately positioned. No evidence of abscess. 3. 1.3 cm right middle lobe nodule, new since 01/31/2021. Favor infectious/inflammatory over primary/secondary neoplasm given the relatively short time frame of development.   04/12/2021 Surgery   Postoperative Diagnosis: Perforated duodenal ulcer (1st portion)   Surgical Procedure:  Modified Graham patch of duodenal  ulcer Gastrostomy tube exchange   Operative Team Members:  Surgeon(s) and Role:    * Stechschulte, Nickola Major, MD - Primary     Drains:  (19 Fr) Jackson-Pratt drain(s) with closed bulb suction in the right upper quadrant near the duodenal ulcer repair and Gastrostomy Tube 26 fr   05/09/2021 Imaging   1. Progression of lesion in the right tonsil with central fluid collection and peripheral enhancement. This was a site of prior recurrent sick could represent progressive tumor or abscess. Continued follow-up recommended. 2. Progressive malignant lymphadenopathy in the left neck. Stable lymphadenopathy right supraclavicular region. 3. New area of stranding in the superior segment left lower lobe but certain etiology. Attention on follow-up recommended.     06/13/2021 -  Chemotherapy   Patient is on Treatment Plan :  Paclitaxel + Carboplatin q7d     06/13/2021 -  Chemotherapy   Patient is on Treatment Plan : HEAD/NECK Pembrolizumab Q21D     07/18/2021 Imaging   Ct chest 1. No highly specific findings identified to suggest pulmonary or nodal metastasis within the chest 2. Multifocal patchy areas of ground-glass and nodular airspace densities. Surrounding postinflammatory changes are noted with thickening of the peribronchovascular interstitium. Imaging findings are concerning for sequelae of aspiration and/or multifocal infection. Follow-up imaging after appropriate antibiotic therapy is recommended with repeat CT of the chest in 3 months to ensure resolution and to rule out the possibility of underlying metastatic nodule. 3. Coronary artery calcifications. 4. Aortic Atherosclerosis (ICD10-I70.0) and Emphysema (ICD10-J43.9).   07/18/2021 Imaging   CT neck  1. Positive treatment response with decreased size of nodal metastases in the left jugular chain. There is likely residual active tumor in the treated nodes. 2. Ulcerated lesions in the bilateral palatine fossa, progressed on the left and  suspicious for synchronous tumors.   07/23/2021 Cancer Staging   Staging form: Larynx - Supraglottis, AJCC 8th Edition - Clinical stage from 07/23/2021: Stage IVA (rcT2, cN2b, cM0) - Signed by [name], MD on 07/23/2021 Stage prefix: Recurrence      Current Therapy: weekly carboplatin and paclitaxel and every 3-week Keytruda last treatment 08/05/21 Last dose of Keytruda was given 07/25/2021  Interval history  He is here for FU. Since his last visit, he complains of some ongoing cough, shortness of breath. He says he has noted low grade fevers but didn't report to any of the family members. He uses the G tube for feeding, but eats some food, has speech therapy visit set up soon to see if he is swallowing ok. Again, he is not a good historian, so its hard to figure out if he is actually having worsening shortness of breath. He continues to take pain medication thrice a day for his sorethroat, refilled today. mild tingling and numbness in his hands, but are still able to function well. He doesn't want taxol dose reduced since its not very bothersome. Taking pain medication thrice a day No changes in urination. No headaches, falls. Rest of the pertinent 10 point ROS reviewed and negative.  No Known Allergies   Past Medical History:  Diagnosis Date  Cancer ([place])    Throat cancer 2019   ETOH abuse    Frequent urination    Glaucoma    Hepatitis C virus infection without hepatic coma    dx'ed in 11/2018   History of radiation therapy 05/12/19- 07/06/19   Larynx   Hypertension    Wears denture    upper only; lost lower denture     Past Surgical History:  Procedure Laterality Date   ANKLE SURGERY  2011   right ankle   COLONOSCOPY  02/2019   polyps - Dr Havery Moros   DIRECT LARYNGOSCOPY N/A 04/01/2019   Procedure: DIRECT LARYNGOSCOPY WITH BIOPSY;  Surgeon: Jodi Marble, MD;  Location: [facility];  Service: ENT;  Laterality: N/A;   DIRECT LARYNGOSCOPY N/A 01/06/2020   Procedure:  DIRECT LARYNGOSCOPY;  Surgeon: Izora Gala, MD;  Location: [facility];  Service: ENT;  Laterality: N/A;   DIRECT LARYNGOSCOPY N/A 08/13/2020   Procedure: DIRECT LARYNGOSCOPY;  Surgeon: Izora Gala, MD;  Location: [facility];  Service: ENT;  Laterality: N/A;   ESOPHAGOSCOPY N/A 04/01/2019   Procedure: ESOPHAGOSCOPY;  Surgeon: Jodi Marble, MD;  Location: [facility];  Service: ENT;  Laterality: N/A;   EXCISION ORAL TUMOR Right 08/13/2020   Procedure: BIOPSY OF OROPHARYNGEAL MASS;  Surgeon: Izora Gala, MD;  Location: [facility];  Service: ENT;  Laterality: Right;   EYE SURGERY Right    IR CM INJ ANY COLONIC TUBE W/FLUORO  12/20/2020   IR CM INJ ANY COLONIC TUBE W/FLUORO  03/20/2021   IR GASTROSTOMY TUBE MOD SED  09/24/2020   IR IMAGING GUIDED PORT INSERTION  09/24/2020   IR RADIOLOGIST EVAL & MGMT  07/02/2021   KNEE SURGERY     LAPAROTOMY N/A 04/12/2021   Procedure: EXPLORATORY LAPAROTOMY, REPAIR OF DUODENAL ULCER WITH Silvestre Gunner;  Surgeon: Felicie Morn, MD;  Location: [facility];  Service: General;  Laterality: N/A;   LARYNGOSCOPY AND BRONCHOSCOPY N/A 04/01/2019   Procedure: BRONCHOSCOPY;  Surgeon: Jodi Marble, MD;  Location: [place];  Service: ENT;  Laterality: N/A;   RADICAL NECK DISSECTION N/A 01/06/2020   Procedure: RADICAL NECK DISSECTION;  Surgeon: Izora Gala, MD;  Location: [facility];  Service: ENT;  Laterality: N/A;    Social History   Socioeconomic History   Marital status: Single    Spouse name: Not on file   Number of children: 2   Years of education: Not on file   Highest education level: Not on file  Occupational History   Not on file  Tobacco Use   Smoking status: Former    Years: 50.00    Types: Cigarettes    Quit date: 06/01/2020    Years since quitting: 1.3   Smokeless tobacco: Never  Vaping Use   Vaping Use: Never used  Substance and Sexual Activity   Alcohol use: Yes    Alcohol/week: 4.0 standard drinks    Types: 4 Cans of beer per week     Comment: 40oz beer daily   Drug use: Yes    Types: Cocaine    Comment: none in 2 yrs   Sexual activity: Yes    Partners: Female  Other Topics Concern   Not on file  Social History Narrative   Patient is divorced with 2 children.   Patient is currently living with his sister.   Patient with a history of smoking a third of pack of cigarettes daily for 50 years.  Patient currently smoking 2 to 3 cigarettes/day.  Patient has never used smokeless tobacco.   Patient with occasional use of alcohol.   Patient last used cocaine approximately 6 months ago.  Patient denies use of marijuana.   Social Determinants of Health   Financial Resource Strain: Not on file  Food Insecurity: No Food Insecurity   Worried About Charity fundraiser in the Last Year: Never true   Ran Out of Food in the Last Year: Never true  Transportation Needs: No Transportation Needs   Lack of Transportation (Medical): No   Lack of Transportation (Non-Medical): No  Physical Activity: Not on file  Stress: Not on file  Social Connections: Unknown   Frequency of Communication with Friends and Family: Three times a week   Frequency of Social Gatherings with Friends and Family: More than three times a week   Attends Religious Services: Not on file   Active Member of Clubs or Organizations: Not on file   Attends Archivist Meetings: Not on file   Marital Status: Not on file  Intimate Partner Violence: Not on file    Family History  Problem Relation Age of Onset   Breast cancer Sister    Colon cancer Brother 75       ????   Cancer Brother      Current Outpatient Medications:    predniSONE (DELTASONE) 10 MG tablet, Take 4 tablets (40 mg total) by mouth daily with breakfast for 10 days., Disp: 40 tablet, Rfl: 0   atropine 1 % ophthalmic solution, Place 1 drop into the right eye 2 (two) times a day. , Disp: , Rfl:    azithromycin (ZITHROMAX) 250 MG tablet, Take as directed, Disp: 6 each, Rfl: 0    lidocaine-prilocaine (EMLA) cream, Apply to affected area once, Disp: 30 g, Rfl: 3   magnesium oxide (MAG-OX) 400 (240 Mg) MG tablet, Take 1 tablet (400 mg total) by mouth daily., Disp: 30 tablet, Rfl: 1   Nutritional Supplements (K[employer] STANDARD 1.4) LIQD, 487 mLs by Enteral route 3 (three) times daily. 1.5 cartons (487 ml) [employer] 1.4 TID with 60 ml free water before and 60 ml free water after each TF bolus. Provide an additional 120 ml free water TID., Disp: , Rfl:    nystatin (MYCOSTATIN) 100000 UNIT/ML suspension, Take 5 mLs (500,000 Units total) by mouth 4 (four) times daily., Disp: 60 mL, Rfl: 0   ondansetron (ZOFRAN) 8 MG tablet, Take 1 tablet (8 mg total) by mouth 2 (two) times daily as needed for refractory nausea / vomiting. Start on day 3 after chemo., Disp: 30 tablet, Rfl: 1   oxyCODONE (OXY IR/ROXICODONE) 5 MG immediate release tablet, Take 1 tablet (5 mg total) by mouth every 6 (six) hours as needed for severe pain., Disp: 90 tablet, Rfl: 0   prednisoLONE acetate (PRED FORTE) 1 % ophthalmic suspension, Place 1 drop into the right eye 4 (four) times daily., Disp: , Rfl:    prochlorperazine (COMPAZINE) 10 MG tablet, Take 1 tablet (10 mg total) by mouth every 6 (six) hours as needed (Nausea or vomiting)., Disp: 30 tablet, Rfl: 1   Vitamin D, Ergocalciferol, (DRISDOL) 1.25 MG (50000 UNIT) CAPS capsule, Take 50,000 Units by mouth every Saturday., Disp: , Rfl:    Water For Irrigation, Sterile (FREE WATER) SOLN, Place 100 mLs into feeding tube every 4 (four) hours., Disp: , Rfl:   PHYSICAL EXAM: ECOG FS:2 - Symptomatic, <50% confined to bed   Physical Exam Constitutional:      Appearance: Normal  appearance.     Comments: Cachectic   HENT:     Head: Normocephalic and atraumatic.  Neck:     Comments: Again he has postradiation changes in his neck exam is hard to appreciate.  He however does not have any massive lymphadenopathy. Cardiovascular:     Rate and Rhythm: Normal rate  and regular rhythm.  Pulmonary:     Effort: Pulmonary effort is normal.     Breath sounds: Normal breath sounds. No wheezing or rales.  Abdominal:     General: Abdomen is flat.     Palpations: Abdomen is soft.  Musculoskeletal:        General: No swelling or tenderness.     Cervical back: Normal range of motion and neck supple. No rigidity.  Lymphadenopathy:     Cervical: No cervical adenopathy.  Skin:    General: Skin is warm and dry.  Neurological:     General: No focal deficit present.     Mental Status: He is alert.  Psychiatric:        Mood and Affect: Mood normal.     LABORATORY DATA: I have reviewed the data as listed CBC Latest Ref Rng & Units 09/20/2021 08/30/2021 08/12/2021  WBC 4.0 - 10.5 K/uL 3.5(L) 5.1 4.1  Hemoglobin 13.0 - 17.0 g/dL 10.3(L) 9.3(L) 10.2(L)  Hematocrit 39.0 - 52.0 % 31.7(L) 28.8(L) 30.8(L)  Platelets 150 - 400 K/uL 259 261 197     CMP Latest Ref Rng & Units 09/20/2021 08/30/2021 08/12/2021  Glucose 70 - 99 mg/dL 118(H) 104(H) 133(H)  BUN 8 - 23 mg/dL 17 18 18   Creatinine 0.61 - 1.24 mg/dL 0.45(L) 0.60(L) 0.70  Sodium 135 - 145 mmol/L 138 139 134(L)  Potassium 3.5 - 5.1 mmol/L 4.0 4.2 4.3  Chloride 98 - 111 mmol/L 102 104 96(L)  CO2 22 - 32 mmol/L 29 28 28   Calcium 8.9 - 10.3 mg/dL 9.2 8.6(L) 8.8(L)  Total Protein 6.5 - 8.1 g/dL 7.4 7.0 7.4  Total Bilirubin 0.3 - 1.2 mg/dL 0.4 0.3 0.3  Alkaline Phos 38 - 126 U/L 44 54 41  AST 15 - 41 U/L 15 13(L) 18  ALT 0 - 44 U/L 8 7 11     RADIOGRAPHIC STUDIES:  I have personally reviewed the radiological images as listed and agreed with the findings in the report. No images are attached to the encounter.  CT Soft Tissue Neck W Contrast  Result Date: 09/19/2021 CLINICAL DATA:  Assess response to chemotherapy. Supraglottic larynx cancer EXAM: CT NECK WITH CONTRAST TECHNIQUE: Multidetector CT imaging of the neck was performed using the standard protocol following the bolus administration of intravenous  contrast. CONTRAST:  62mL OMNIPAQUE IOHEXOL 350 MG/ML SOLN COMPARISON:  07/18/2021 FINDINGS: Pharynx and larynx: Avidly enhancing mucosal based areas at the bilateral palatine tonsil with central low-density/cavitary features. The larger is on the right at 15 mm, increased by few mm from before. Similar appearance of the left-sided tonsillar lesion measuring 14 mm. There is submucosal low-density thickening throughout the throat that is attributed to treatment. Salivary glands: Generalized post treatment atrophy. There is a nodule in the left parotid measuring 8 mm, more conspicuous from before given the gland has become lower in density, but stable over priors. Thyroid: Normal. Lymph nodes: Regression of avidly enhancing lymph nodes in the neck. More generalized architectural distortion attributed to scarring. Vascular: Atheromatous plaque with ICA tortuosity especially on the right, stable. No convincing pseudoaneurysm. Chronic right IJ occlusion. Limited intracranial: Negative Visualized orbits: Not covered  Mastoids and visualized paranasal sinuses: Clear Skeleton: No malignant erosion. Advanced cervical spine degeneration. Upper chest: Reported separately IMPRESSION: 1. Positive treatment response at the cervical lymph nodes. No residual nodal disease identified currently. 2. Persisting bilateral palatine tonsil lesion with mild size increase on the right. Electronically Signed   By: Jorje Guild M.D.   On: 09/19/2021 08:59   CT Chest W Contrast  Result Date: 09/19/2021 CLINICAL DATA:  Supraglottic carcinoma EXAM: CT CHEST WITH CONTRAST TECHNIQUE: Multidetector CT imaging of the chest was performed during intravenous contrast administration. CONTRAST:  49mL OMNIPAQUE IOHEXOL 350 MG/ML SOLN COMPARISON:  07/19/2019 FINDINGS: Cardiovascular: Extensive coronary artery calcifications are seen. There is homogeneous enhancement in thoracic aorta. Ascending thoracic aorta measures 3.6 cm. Contrast density in the  pulmonary artery branches is less than adequate for evaluation of the lumen. Mediastinum/Nodes: No significant new lymphadenopathy is seen. There are few subcentimeter nodes in mediastinum. Lungs/Pleura: There are patchy increased interstitial markings in the left lower lobe with interval worsening. There is ectasia of bronchi in the left lower lung fields. There is small pleural-based infiltrate in the right posterior costophrenic angle in right lower lobe. In the image 63 of series 7, there is 7 mm subpleural nodule in the right upper lobe. In image 80 of series 7, there is 6 mm nodular density in the right lower lobe. In image 101, there is 7 mm pleural-based nodule in the right middle lobe. There are new patchy infiltrates in the anterior and posterior segments of right upper lobe. There is small area of increased interstitial markings in the posterior right upper lobe. In image 25, there is new small focal area of increased interstitial markings in the lateral aspect of left upper lobe. There is interval clearing of small patchy infiltrates in the posteromedial right lower lobe since the previous examination. Upper Abdomen: Gastrostomy tube is noted in place. Musculoskeletal: Deformity is seen in the sternoclavicular joints, more so on the left side with no significant interval change. Left IJ chest port is seen. IMPRESSION: No new significant lymphadenopathy seen in the mediastinum. There is interval increase in infiltrates at multiple sites in both lungs as described in the body of the report. There is interval clearing of small patchy infiltrates in right lower lobe. Findings suggest possible waxing and waning multifocal pneumonia. There are few subcentimeter nodules as described in the body of the report which may be part of pneumonia or neoplastic process. Short-term follow-up CT in 3 months may be considered. Coronary artery calcifications are seen. There is ectasia of ascending thoracic aorta.  Electronically Signed   By: Elmer Picker M.D.   On: 09/19/2021 12:56     ASSESSMENT & PLAN:  Patient is a 71 y.o. male with history of squamous cell carcinoma of right supraglottis on weekly carbotaxol and every 21-day Keytruda.  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 Showers is a patient with complicated history of supraglottic tumor with cancer recurrence in the head and neck region currently on weekly CarboTaxol and every 21-day Keytruda.   He is here for FU since last imaging which essentially showed stable disease. 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 Lung exam is clear. He wasn't hypoxic today. Low  grade fever noted I will hold treatment, give abx, reassess him in a week by Mendel Ryder, FU with me in 2 weeks.   Pneumonitis Grade 1 likely from immunotherapy vs infection Will give him 10 days of steroids Z pak for possible atypical pneumonia Repeat CT in 2/3 weeks. Hold immunotherapy for now, since the grade is not quite clear, patient not a great historian   I spent 40 minutes in the care of this patient including history, physical exam, review of records, counseling and coordination of care. I discussed with radiology about this imaging, discussed about management of pneumonitis, discussed imaging findings with patient and surveillance recommendations. Thank you for allowing me to participate in the care of this patient.    [name], MD Department of Hematology/Oncology [facility] at [facility] Phone: [phone]  [phone]    09/23/2021 8:19 AM

## 2021-09-23 ENCOUNTER — Encounter: Payer: Self-pay | Admitting: Hematology and Oncology

## 2021-09-23 DIAGNOSIS — J189 Pneumonia, unspecified organism: Secondary | ICD-10-CM | POA: Insufficient documentation

## 2021-09-23 NOTE — Assessment & Plan Note (Signed)
Continue current pain regimen Refilled today

## 2021-09-23 NOTE — Assessment & Plan Note (Signed)
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   He is here for FU since last imaging which essentially showed stable disease. 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 Lung exam is clear. He wasn't hypoxic today. Low grade fever noted I will hold treatment, give abx, reassess him in a week by Mendel Ryder, FU with me in 2 weeks.

## 2021-09-23 NOTE — Assessment & Plan Note (Signed)
Grade 1 likely from immunotherapy vs infection Will give him 10 days of steroids Z pak for possible atypical pneumonia Repeat CT in 2/3 weeks. Hold immunotherapy for now, since the grade is not quite clear, patient not a great historian

## 2021-09-26 ENCOUNTER — Other Ambulatory Visit: Payer: Self-pay

## 2021-09-26 ENCOUNTER — Inpatient Hospital Stay (HOSPITAL_BASED_OUTPATIENT_CLINIC_OR_DEPARTMENT_OTHER): Payer: Medicare HMO | Admitting: Adult Health

## 2021-09-26 ENCOUNTER — Other Ambulatory Visit: Payer: Medicare HMO

## 2021-09-26 ENCOUNTER — Encounter: Payer: Self-pay | Admitting: Adult Health

## 2021-09-26 ENCOUNTER — Ambulatory Visit: Payer: Medicare HMO

## 2021-09-26 VITALS — BP 138/95 | HR 88 | Temp 97.8°F | Resp 18 | Ht 67.0 in | Wt 106.8 lb

## 2021-09-26 DIAGNOSIS — C321 Malignant neoplasm of supraglottis: Secondary | ICD-10-CM

## 2021-09-26 DIAGNOSIS — Z5111 Encounter for antineoplastic chemotherapy: Secondary | ICD-10-CM | POA: Diagnosis not present

## 2021-09-26 NOTE — Assessment & Plan Note (Signed)
Mr. Wise is a patient with complicated history of supraglottic tumor with cancer recurrence in the head and neck region currently on weekly CarboTaxol and every 21-day Keytruda.    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  His most recent treatment was given on August 30, 2021.  2.  Pulmonary infiltrate on imaging: He is afebrile today.  His lung exam is clear.  He has completed antibiotics and steroids.  3.  Dietary intake: He is underweight.  He has a G-tube that he is using.  I did ask either the nutritionist or navigator to come up and talk to him about his tube feeds and free water.  4.  Cancer related pain: He continues on oxycodone 3 times a day.  This is helping control his pain.  Rashawn appears to be clinically improving from his last visit last week.  He will return next week for continued follow-up with Dr. Chryl Heck.

## 2021-09-26 NOTE — Progress Notes (Signed)
[name] Follow up:    Alan Ebbs, MD [place] [address]   DIAGNOSIS:  Adams Staging  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[place]) Staging form: Larynx - Supraglottis, AJCC 8th Edition - Clinical stage from 07/23/2021: Stage IVA (rcT2, cN2b, cM0) - Signed by [name], MD on 07/23/2021 Stage prefix: Recurrence   SUMMARY OF ONCOLOGIC HISTORY: Oncology History Overview Note  Recurrent SCC, p16 neg   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[place])  04/01/2019 Procedure   Direct laryngoscopy w/ debulking of the tumor arising from the lateral surface of the right aryepiglottic fold and anterior/lateral aspect of the piriform sinus on the right side    04/01/2019 Pathology Results   Accession: [id number]  Larynx, biopsy, Right Supraglottic Tumor - POORLY DIFFERENTIATED SQUAMOUS CELL CARCINOMA WITH FOCAL SARCOMATOID CHANGES. SEE NOTE   04/01/2019 Surgery   Pre-Op Dx:   right supraglottic carcinoma   Post-op Dx: T2 N0 M0 (stage II) right supraglottic carcinoma   Proc: Direct laryngoscopy with biopsy.  Cervical esophagoscopy.  Bronchoscopy.   Surg:  [name]    Findings: A bulky necrotic and semi-pedunculated tumor coming off the lateral surface of the right aryepiglottic fold and the anterior and lateral aspect of the piriform sinus on the right side.  Airway was compromised by the tumor mass at intubation.   04/11/2019 Imaging   CT neck: IMPRESSION: The glottis is closed with suboptimal evaluation of the vocal cords. No asymmetry of the cords. Correlate with laryngoscopy results.   No enlarged lymph nodes in the neck. 7 mm right level 2 lymph node may be reactive. No definite pathologic lymph nodes in the neck.   04/11/2019 Imaging   CT neck  This CT was performed after surgical debulking of an exophytic laryngeal tumor.   Most apparent on series 2, image 51 there is abnormal asymmetric  hyperenhancement along the surface of the right piriform sinus, and perhaps also involving the bilateral AE folds.   04/22/2019 Imaging   PET: IMPRESSION: 1. Mild residual activity in the posterior RIGHT hypopharynx confined to the mucosa. 2. No evidence of hypermetabolic metastatic lymph nodes in LEFT or RIGHT neck. 3. No evidence distant metastatic disease.   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[place])   05/12/2019 - 07/06/2019 Radiation Therapy   Radiation Treatment Dates: 05/12/2019 through 07/06/2019 Site Technique Total Dose (Gy) Dose per Fx (Gy) Completed Fx Beam Energies  Head & neck: HN_larynx IMRT 70/70 2 35/35 6X     Radiation Treatment Dates: 09/18/2020 through 10/31/2020 Site Technique Total Dose (Gy) Dose per Fx (Gy) Completed Fx Beam Energies  Oropharynx: HN_orophar IMRT 60/60 2 30/30 6X        10/27/2019 PET scan   1. New FDG avid right level 2 cervical lymph node concerning for recurrent disease. No findings of distant metastatic disease within the chest, abdomen or pelvis. 2. Mild nonspecific increased uptake in the right side of larynx is similar to previous exam. 3. Aortic Atherosclerosis (ICD10-I70.0). Coronary artery calcifications.    11/21/2019 Pathology Results   SURGICAL PATHOLOGY  CASE: [id number]  [name]  Surgical Pathology Report   FINAL MICROSCOPIC DIAGNOSIS:   A. LYMPH NODE, RIGHT CERVICAL:  - Poorly differentiated carcinoma consistent with metastatic squamous cell carcinoma.  - See comment   01/06/2020 Pathology Results   SURGICAL PATHOLOGY  CASE: [id number]  [name]  Surgical Pathology Report  Clinical History: metastatic Adams to cervical lymph nodes (cm)   FINAL MICROSCOPIC DIAGNOSIS:   A. LYMPH NODE, RIGHT MODIFIED, DISSECTION:  - Metastatic squamous cell carcinoma in four of nineteen lymph nodes (4/19).  - Largest metastatic nodule is 3.1 cm with 5 mm  extra-nodal extension.  - Lymphovascular space involvement in peri-nodal connective tissue.  - See comment.    01/06/2020 Surgery   PRE-OPERATIVE DIAGNOSIS:  METASTATIC Adams TO CERVICAL LYMPH NODES   POST-OPERATIVE DIAGNOSIS:  METASTATIC Adams TO CERVICAL LYMPH NODES   PROCEDURE:  Procedure(s): DIRECT LARYNGOSCOPY MODIFIED RADICAL NECK DISSECTION   SURGEON:  [name], MD     SPECIMEN: Right modified neck dissection including levels 2 and 3, including the internal jugular vein, suture marks the inferior jugular vein stump.   07/05/2020 Imaging   1. Sequelae of radiation and right neck dissection from the previous contrasted Neck CT.   2. Asymmetric soft tissue thickening and enhancement along the right lateral pharynx. Although this might be asymmetric mucositis, the appearance is suspicious and recommend direct inspection. NI-RADS category 2a.   3. Suspicious small 6 mm hyperenhancing nodular soft tissue along the posterior margin of the neck dissection at right level 3. NI-RADS category 2 vs 3 - although this is likely too small for imaging guided biopsy. Repeat PET-CT may be most valuable.   4. Mild inflammatory appearing right upper lobe centrilobular ground-glass opacity, new since February. Consider mild or developing right upper lobe infection. Post radiation changes to the lung apices.       08/13/2020 Pathology Results   A. OROPHARNYX, BIOPSY:  - Poorly differentiated squamous cell carcinoma.  - See comment.    08/13/2020 Surgery   POST-OPERATIVE DIAGNOSIS:  Tonsillary Mass History of Laryngeal  Adams   PROCEDURE:  Procedure(s): DIRECT LARYNGOSCOPY BIOPSY OF OROPHARYNGEAL MASS   08/30/2020 PET scan   1. Recurrence of head neck carcinoma with a broad hypermetabolic pharyngeal mucosal lesion involving the RIGHT lateral posterior oropharynx and hypopharynx extending from the palatine tonsil to the vallecula. 2. Hypermetabolic lymph node posterior  sternocleidomastoid muscle on the RIGHT (level 3). 3. Hypermetabolic nodule within the LEFT parotid glands favored small primary parotid neoplasm. 4. No evidence thoracic metastasis   09/24/2020 - 10/30/2020 Chemotherapy   He received weekly cisplatin   01/31/2021 PET scan   Findings of worsening nodal disease with a node or group of nodes in the LEFT neck and a RIGHT supraclavicular lymph node as discussed.   Post treatment changes with soft tissue fullness in the area of the prior tumor but with markedly diminished metabolic activity.     03/11/2021 Imaging   Ct neck  Diffuse pharyngeal and laryngeal mucosal and submucosal edema probably related to acute radiation injury. The airway is narrow and could possibly be critically narrowed. I do not see any evidence of recurrent mucosal or submucosal tumor identifiable.   Similar appearance of the recurrent malignant lymphadenopathy on the left at level 2 and level 3 and on the right in the low supraclavicular region just above and behind the clavicular head.   04/12/2021 Imaging   Ct abdomen and pelvis  1. Contrast material in the peritoneal cavity of the upper abdomen suggesting leak along the gastrostomy tract. 2. Gastrostomy tube is appropriately positioned. No evidence of abscess. 3. 1.3 cm right middle lobe nodule, new since 01/31/2021. Favor infectious/inflammatory over primary/secondary neoplasm given the relatively short time frame of development.   04/12/2021 Surgery   Postoperative Diagnosis: Perforated duodenal ulcer (1st  portion)   Surgical Procedure:  Modified Graham patch of duodenal ulcer Gastrostomy tube exchange   Operative Team Members:  Surgeon(s) and Role:    * Stechschulte, Nickola Major, MD - Primary     Drains:  (19 Fr) Jackson-Pratt drain(s) with closed bulb suction in the right upper quadrant near the duodenal ulcer repair and Gastrostomy Tube 26 fr   05/09/2021 Imaging   1. Progression of lesion in the right tonsil  with central fluid collection and peripheral enhancement. This was a site of prior recurrent sick could represent progressive tumor or abscess. Continued follow-up recommended. 2. Progressive malignant lymphadenopathy in the left neck. Stable lymphadenopathy right supraclavicular region. 3. New area of stranding in the superior segment left lower lobe but certain etiology. Attention on follow-up recommended.     06/13/2021 -  Chemotherapy   Patient is on Treatment Plan :  Paclitaxel + Carboplatin q7d     06/13/2021 -  Chemotherapy   Patient is on Treatment Plan : HEAD/NECK Pembrolizumab Q21D     07/18/2021 Imaging   Ct chest 1. No highly specific findings identified to suggest pulmonary or nodal metastasis within the chest 2. Multifocal patchy areas of ground-glass and nodular airspace densities. Surrounding postinflammatory changes are noted with thickening of the peribronchovascular interstitium. Imaging findings are concerning for sequelae of aspiration and/or multifocal infection. Follow-up imaging after appropriate antibiotic therapy is recommended with repeat CT of the chest in 3 months to ensure resolution and to rule out the possibility of underlying metastatic nodule. 3. Coronary artery calcifications. 4. Aortic Atherosclerosis (ICD10-I70.0) and Emphysema (ICD10-J43.9).   07/18/2021 Imaging   CT neck  1. Positive treatment response with decreased size of nodal metastases in the left jugular chain. There is likely residual active tumor in the treated nodes. 2. Ulcerated lesions in the bilateral palatine fossa, progressed on the left and suspicious for synchronous tumors.   07/23/2021 Adams Staging   Staging form: Larynx - Supraglottis, AJCC 8th Edition - Clinical stage from 07/23/2021: Stage IVA (rcT2, cN2b, cM0) - Signed by [name], MD on 07/23/2021 Stage prefix: Recurrence      CURRENT THERAPY: Taxol, [place]  INTERVAL HISTORY: [name] 71 y.o. male returns for  evaluation after being seen by Dr. Chryl Heck last week and there being concern for infection.  He has completed a Z-Pak.  He continues on prednisone 40 mg daily.  He has a few more days of this.  He does continue to have a sore throat and is taking oxycodone 3 times a day to help with this pain.  He notes that he is using his feeding tube as recommended for the supplements however he is not sure that he is taking in as much water as he should be.  He also notes some pain at the feeding tube insertion site.  Khye said that he had a fever yesterday.  He could not quantify this number.  He is afebrile today.  He denies any cough, shortness of breath, headaches, vision changes, skin issues, or any other concerns.   Patient Active Problem List   Diagnosis Date Noted   Pneumonitis 09/23/2021   Hypomagnesemia 08/05/2021   Dysphagia 07/24/2021   Neuropathy due to chemotherapeutic drug ([place]) 07/04/2021   Weight loss, unintentional 06/20/2021   Protein-calorie malnutrition, severe 04/17/2021   Perforated gastric ulcer ([place])    Gastrostomy tube dysfunction ([place]) 04/12/2021   Anemia due to antineoplastic chemotherapy 03/26/2021   Adams related pain 03/26/2021   Laryngeal edema 03/11/2021  Nausea and vomiting 01/24/2021   Shortness of breath 01/10/2021   Mucositis due to antineoplastic therapy 01/10/2021   G tube feedings ([place]) 01/10/2021   Chemotherapy induced diarrhea 10/30/2020   Port-A-Cath in place 10/23/2020   Chronic hepatitis C without hepatic coma ([place]) 06/09/2019   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[place]) 05/02/2019    has No Known Allergies.  MEDICAL HISTORY: Past Medical History:  Diagnosis Date   Adams ([place])    Throat Adams 2019   ETOH abuse    Frequent urination    Glaucoma    Hepatitis C virus infection without hepatic coma    dx'ed in 11/2018   History of radiation therapy 05/12/19- 07/06/19   Larynx   Hypertension    Wears denture    upper only; lost  lower denture    SURGICAL HISTORY: Past Surgical History:  Procedure Laterality Date   ANKLE SURGERY  2011   right ankle   COLONOSCOPY  02/2019   polyps - Dr Havery Moros   DIRECT LARYNGOSCOPY N/A 04/01/2019   Procedure: DIRECT LARYNGOSCOPY WITH BIOPSY;  Surgeon: Jodi Marble, MD;  Location: [facility];  Service: ENT;  Laterality: N/A;   DIRECT LARYNGOSCOPY N/A 01/06/2020   Procedure: DIRECT LARYNGOSCOPY;  Surgeon: Izora Gala, MD;  Location: [facility];  Service: ENT;  Laterality: N/A;   DIRECT LARYNGOSCOPY N/A 08/13/2020   Procedure: DIRECT LARYNGOSCOPY;  Surgeon: Izora Gala, MD;  Location: [facility];  Service: ENT;  Laterality: N/A;   ESOPHAGOSCOPY N/A 04/01/2019   Procedure: ESOPHAGOSCOPY;  Surgeon: Jodi Marble, MD;  Location: [facility];  Service: ENT;  Laterality: N/A;   EXCISION ORAL TUMOR Right 08/13/2020   Procedure: BIOPSY OF OROPHARYNGEAL MASS;  Surgeon: Izora Gala, MD;  Location: [facility];  Service: ENT;  Laterality: Right;   EYE SURGERY Right    IR CM INJ ANY COLONIC TUBE W/FLUORO  12/20/2020   IR CM INJ ANY COLONIC TUBE W/FLUORO  03/20/2021   IR GASTROSTOMY TUBE MOD SED  09/24/2020   IR IMAGING GUIDED PORT INSERTION  09/24/2020   IR RADIOLOGIST EVAL & MGMT  07/02/2021   KNEE SURGERY     LAPAROTOMY N/A 04/12/2021   Procedure: EXPLORATORY LAPAROTOMY, REPAIR OF DUODENAL ULCER WITH Silvestre Gunner;  Surgeon: Felicie Morn, MD;  Location: [facility];  Service: General;  Laterality: N/A;   LARYNGOSCOPY AND BRONCHOSCOPY N/A 04/01/2019   Procedure: BRONCHOSCOPY;  Surgeon: Jodi Marble, MD;  Location: [facility];  Service: ENT;  Laterality: N/A;   RADICAL NECK DISSECTION N/A 01/06/2020   Procedure: RADICAL NECK DISSECTION;  Surgeon: Izora Gala, MD;  [facility];  Service: ENT;  Laterality: N/A;    SOCIAL HISTORY: Social History   Socioeconomic History   Marital status: Single    Spouse name: Not on file   Number of children: 2   Years of education: Not  on file   Highest education level: Not on file  Occupational History   Not on file  Tobacco Use   Smoking status: Former    Years: 50.00    Types: Cigarettes    Quit date: 06/01/2020    Years since quitting: 1.3   Smokeless tobacco: Never  Vaping Use   Vaping Use: Never used  Substance and Sexual Activity   Alcohol use: Yes    Alcohol/week: 4.0 standard drinks    Types: 4 Cans of beer per week    Comment: 40oz beer daily   Drug use: Yes  Types: Cocaine    Comment: none in 2 yrs   Sexual activity: Yes    Partners: Female  Other Topics Concern   Not on file  Social History Narrative   Patient is divorced with 2 children.   Patient is currently living with his sister.   Patient with a history of smoking a third of pack of cigarettes daily for 50 years.  Patient currently smoking 2 to 3 cigarettes/day.   Patient has never used smokeless tobacco.   Patient with occasional use of alcohol.   Patient last used cocaine approximately 6 months ago.  Patient denies use of marijuana.   Social Determinants of Health   Financial Resource Strain: Not on file  Food Insecurity: Not on file  Transportation Needs: Not on file  Physical Activity: Not on file  Stress: Not on file  Social Connections: Not on file  Intimate Partner Violence: Not on file    FAMILY HISTORY: Family History  Problem Relation Age of Onset   Breast Adams Sister    Colon Adams Brother 49       ????   Adams Brother     Review of Systems  Constitutional:  Positive for fatigue (Mild). Negative for appetite change, chills, fever and unexpected weight change.  HENT:   Positive for sore throat. Negative for hearing loss, lump/mass, mouth sores and trouble swallowing.   Eyes:  Negative for eye problems and icterus.  Respiratory:  Negative for chest tightness, cough and shortness of breath.   Cardiovascular:  Negative for chest pain, leg swelling and palpitations.  Gastrointestinal:  Negative for abdominal  distention, abdominal pain, constipation, diarrhea, nausea and vomiting.  Endocrine: Negative for hot flashes.  Genitourinary:  Negative for difficulty urinating.   Musculoskeletal:  Negative for arthralgias.  Skin:  Negative for itching and rash.  Neurological:  Negative for dizziness, extremity weakness, headaches and numbness.  Hematological:  Negative for adenopathy. Does not bruise/bleed easily.  Psychiatric/Behavioral:  Negative for depression. The patient is not nervous/anxious.      PHYSICAL EXAMINATION  ECOG PERFORMANCE STATUS: 2 - Symptomatic, <50% confined to bed  Vitals:   09/26/21 0953  BP: (!) 138/95  Pulse: 88  Resp: 18  Temp: 97.8 F (36.6 C)  SpO2: 100%    Physical Exam Constitutional:      General: He is not in acute distress.    Appearance: Normal appearance. He is not toxic-appearing.  HENT:     Head: Normocephalic and atraumatic.  Eyes:     General: No scleral icterus. Cardiovascular:     Rate and Rhythm: Normal rate and regular rhythm.     Pulses: Normal pulses.     Heart sounds: Normal heart sounds.  Pulmonary:     Effort: Pulmonary effort is normal.     Breath sounds: Normal breath sounds.  Abdominal:     General: Abdomen is flat. Bowel sounds are normal. There is no distension.     Palpations: Abdomen is soft.     Tenderness: There is no abdominal tenderness.  Musculoskeletal:        General: No swelling.     Cervical back: Neck supple.  Lymphadenopathy:     Cervical: No cervical adenopathy.  Skin:    General: Skin is warm and dry.     Findings: No rash.  Neurological:     General: No focal deficit present.     Mental Status: He is alert.  Psychiatric:        Mood and  Affect: Mood normal.        Behavior: Behavior normal.    LABORATORY DATA:  CBC    Component Value Date/Time   WBC 3.5 (L) 09/20/2021 1335   RBC 3.14 (L) 09/20/2021 1335   HGB 10.3 (L) 09/20/2021 1335   HGB 10.4 (L) 07/04/2021 1025   HCT 31.7 (L) 09/20/2021  1335   PLT 259 09/20/2021 1335   PLT 305 07/04/2021 1025   MCV 101.0 (H) 09/20/2021 1335   MCH 32.8 09/20/2021 1335   MCHC 32.5 09/20/2021 1335   RDW 16.5 (H) 09/20/2021 1335   LYMPHSABS 0.8 09/20/2021 1335   MONOABS 0.5 09/20/2021 1335   EOSABS 0.1 09/20/2021 1335   BASOSABS 0.0 09/20/2021 1335    CMP     Component Value Date/Time   NA 138 09/20/2021 1335   K 4.0 09/20/2021 1335   CL 102 09/20/2021 1335   CO2 29 09/20/2021 1335   GLUCOSE 118 (H) 09/20/2021 1335   BUN 17 09/20/2021 1335   CREATININE 0.45 (L) 09/20/2021 1335   CREATININE 0.59 (L) 07/04/2021 1025   CREATININE 0.61 (L) 10/26/2019 1523   CALCIUM 9.2 09/20/2021 1335   PROT 7.4 09/20/2021 1335   ALBUMIN 3.9 09/20/2021 1335   AST 15 09/20/2021 1335   AST 10 (L) 07/04/2021 1025   ALT 8 09/20/2021 1335   ALT 7 07/04/2021 1025   ALT 53 (H) 09/06/2019 1543   ALKPHOS 44 09/20/2021 1335   BILITOT 0.4 09/20/2021 1335   BILITOT 0.2 (L) 07/04/2021 1025   GFRNONAA >60 09/20/2021 1335   GFRNONAA >60 07/04/2021 1025   GFRAA >60 01/03/2020 1355   GFRAA >60 05/04/2019 1207     ASSESSMENT and THERAPY PLAN:   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[place]) [name] is a patient with complicated history of supraglottic tumor with Adams recurrence in the head and neck region currently on weekly CarboTaxol and every 21-day Keytruda.    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  His most recent treatment was given on August 30, 2021.  2.  Pulmonary infiltrate on imaging: He is afebrile today.  His lung exam is clear.  He has completed antibiotics and steroids.  3.  Dietary intake: He is underweight.  He has a G-tube that he is using.  I did ask either the nutritionist or navigator to come up and talk to him about his tube feeds and free water.  4.  Adams related pain: He continues on oxycodone 3 times a day.  This is helping control his pain.  [name]  appears to be clinically improving from his last visit last week.  He will return next week for continued follow-up with Dr. Chryl Heck.   All questions were answered. The patient knows to call the clinic with any problems, questions or concerns. We can certainly see the patient much sooner if necessary.  Total encounter time: 20 minutes in face-to-face visit time, chart review, lab review, care coordination, and documentation of the encounter.  Alan Bihari, NP 09/26/21 10:31 AM Medical Oncology and Hematology [facility] [place], [place] [address] Tel. [phone]    Fax. [phone]  *Total Encounter Time as defined by the Centers for Medicare and Medicaid Services includes, in addition to the face-to-face time of a patient visit (documented in the note above) non-face-to-face time: obtaining and reviewing outside history, ordering and reviewing medications, tests or procedures, care coordination (communications  with other health care professionals or caregivers) and documentation in the medical record.

## 2021-09-30 ENCOUNTER — Emergency Department (HOSPITAL_COMMUNITY)
Admission: EM | Admit: 2021-09-30 | Discharge: 2021-09-30 | Disposition: A | Discharge status: Home / Self Care | Payer: Medicare HMO | Attending: Student | Admitting: Student

## 2021-09-30 ENCOUNTER — Emergency Department (HOSPITAL_COMMUNITY): Payer: Medicare HMO

## 2021-09-30 ENCOUNTER — Encounter (HOSPITAL_COMMUNITY): Payer: Self-pay

## 2021-09-30 ENCOUNTER — Other Ambulatory Visit: Payer: Self-pay

## 2021-09-30 DIAGNOSIS — Z87891 Personal history of nicotine dependence: Secondary | ICD-10-CM | POA: Insufficient documentation

## 2021-09-30 DIAGNOSIS — I10 Essential (primary) hypertension: Secondary | ICD-10-CM | POA: Insufficient documentation

## 2021-09-30 DIAGNOSIS — Z8529 Personal history of malignant neoplasm of other respiratory and intrathoracic organs: Secondary | ICD-10-CM | POA: Diagnosis not present

## 2021-09-30 DIAGNOSIS — Z79899 Other long term (current) drug therapy: Secondary | ICD-10-CM | POA: Insufficient documentation

## 2021-09-30 DIAGNOSIS — T85528A Displacement of other gastrointestinal prosthetic devices, implants and grafts, initial encounter: Secondary | ICD-10-CM | POA: Insufficient documentation

## 2021-09-30 MED ORDER — IOPAMIDOL (ISOVUE-300) INJECTION 61%
100.0000 mL | Freq: Once | INTRAVENOUS | Status: AC | PRN
  Administered 2021-09-30: 20 mL

## 2021-09-30 NOTE — ED Notes (Signed)
Pt states understanding of dc instructions, importance of follow up.  Pt denies questions or concerns upon dc. Pt declined wheelchair assistance upon dc. Pt ambulated out of ed w/ steady gait. No belongings left in room upon dc.  

## 2021-09-30 NOTE — ED Triage Notes (Signed)
Pt bib ems for g-tube replacement. Pt states tube was pulled out while bathing this am. Pt's tube 19F.

## 2021-09-30 NOTE — ED Provider Notes (Signed)
, [place] DEPT Provider Note  CSN: [id number] Arrival date & time: 09/30/21 1336  Chief Complaint(s) No chief complaint on file.  HPI [name] is a 71 y.o. male with PMH throat cancer, hepatitis C tube dependence who presents emergency department for evaluation of a accidental G-tube dislodgment.  Patient states that approximately 1 hour prior to arrival he accidentally dislodged his G-tube with the balloon inflated.  He denies abdominal pain, nausea, vomiting, chest pain, shortness of breath or other systemic symptoms.  Patient arrives with a dislodged 24 [place] G-tube.  HPI  Past Medical History Past Medical History:  Diagnosis Date   Cancer ([place])    Throat cancer 2019   ETOH abuse    Frequent urination    Glaucoma    Hepatitis C virus infection without hepatic coma    dx'ed in 11/2018   History of radiation therapy 05/12/19- 07/06/19   Larynx   Hypertension    Wears denture    upper only; lost lower denture   Patient Active Problem List   Diagnosis Date Noted   Pneumonitis 09/23/2021   Hypomagnesemia 08/05/2021   Dysphagia 07/24/2021   Neuropathy due to chemotherapeutic drug ([place]) 07/04/2021   Weight loss, unintentional 06/20/2021   Protein-calorie malnutrition, severe 04/17/2021   Perforated gastric ulcer ([place])    Gastrostomy tube dysfunction ([place]) 04/12/2021   Anemia due to antineoplastic chemotherapy 03/26/2021   Cancer related pain 03/26/2021   Laryngeal edema 03/11/2021   Nausea and vomiting 01/24/2021   Shortness of breath 01/10/2021   Mucositis due to antineoplastic therapy 01/10/2021   G tube feedings ([place]) 01/10/2021   Chemotherapy induced diarrhea 10/30/2020   Port-A-Cath in place 10/23/2020   Chronic hepatitis C without hepatic coma ([place]) 06/09/2019   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 Authorizing Provider  atropine 1 % ophthalmic solution Place 1 drop into the right eye 2 (two) times a day.     [provider]  azithromycin (ZITHROMAX) 250 MG tablet Take as directed 09/20/21   [name], MD  lidocaine-prilocaine (EMLA) cream Apply to affected area once 06/06/21   [name], MD  losartan (COZAAR) 50 MG tablet Take 50 mg by mouth daily. 09/19/21   [provider]  magnesium oxide (MAG-OX) 400 (240 Mg) MG tablet Take 1 tablet (400 mg total) by mouth daily. 08/05/21   [name], MD  Nutritional Supplements (KATE [employer]ARMS STANDARD 1.4) LIQD 487 mLs by Enteral route 3 (three) times daily. 1.5 cartons (487 ml) [employer] 1.4 TID with 60 ml free water before and 60 ml free water after each TF bolus. Provide an additional 120 ml free water TID. 04/22/21   [name], DO  nystatin (MYCOSTATIN) 100000 UNIT/ML suspension Take 5 mLs (500,000 Units total) by mouth 4 (four) times daily. 08/12/21   Walisiewicz, Kaitlyn E, PA-C  ondansetron (ZOFRAN) 8 MG tablet Take 1 tablet (8 mg total) by mouth 2 (two) times daily as needed for refractory nausea / vomiting. Start on day 3 after chemo. 06/06/21   [name], MD  oxyCODONE (OXY IR/ROXICODONE) 5 MG immediate release tablet Take 1 tablet (5 mg total) by mouth every 6 (six) hours as needed for severe pain. 09/20/21   [name], MD  pantoprazole (PROTONIX) 40 MG tablet Take 40 mg by mouth 2 (two) times daily. 05/09/21   [provider]  prednisoLONE acetate (PRED FORTE) 1 % ophthalmic suspension Place 1 drop into the right eye 4 (four) times daily.    [provider]  predniSONE (DELTASONE) 10 MG tablet Take 4 tablets (40 mg total) by mouth daily with breakfast for 10 days. 09/20/21 09/30/21  [name], MD  prochlorperazine (COMPAZINE) 10 MG tablet Take 1 tablet (10 mg total) by mouth every 6 (six) hours as needed (Nausea or vomiting). 06/06/21   [name], MD  Vitamin D,  Ergocalciferol, (DRISDOL) 1.25 MG (50000 UNIT) CAPS capsule Take 50,000 Units by mouth every Saturday. 05/22/20   [provider]  Water For Irrigation, Sterile (FREE WATER) SOLN Place 100 mLs into feeding tube every 4 (four) hours. 04/22/21   [name], DO                                                                                                                                    Past Surgical History Past Surgical History:  Procedure Laterality Date   ANKLE SURGERY  2011   right ankle   COLONOSCOPY  02/2019   polyps - Dr Havery Moros   DIRECT LARYNGOSCOPY N/A 04/01/2019   Procedure: DIRECT LARYNGOSCOPY WITH BIOPSY;  Surgeon: Jodi Marble, MD;  Location: [facility];  Service: ENT;  Laterality: N/A;   DIRECT LARYNGOSCOPY N/A 01/06/2020   Procedure: DIRECT LARYNGOSCOPY;  Surgeon: Izora Gala, MD;  Location: [facility];  Service: ENT;  Laterality: N/A;   DIRECT LARYNGOSCOPY N/A 08/13/2020   Procedure: DIRECT LARYNGOSCOPY;  Surgeon: Izora Gala, MD;  Location: [facility];  Service: ENT;  Laterality: N/A;   ESOPHAGOSCOPY N/A 04/01/2019   Procedure: ESOPHAGOSCOPY;  Surgeon: Jodi Marble, MD;  Location: [facility];  Service: ENT;  Laterality: N/A;   EXCISION ORAL TUMOR Right 08/13/2020   Procedure: BIOPSY OF OROPHARYNGEAL MASS;  Surgeon: Izora Gala, MD;  Location: [facility];  Service: ENT;  Laterality: Right;   EYE SURGERY Right    IR CM INJ ANY COLONIC TUBE W/FLUORO  12/20/2020   IR CM INJ ANY COLONIC TUBE W/FLUORO  03/20/2021   IR GASTROSTOMY TUBE MOD SED  09/24/2020   IR IMAGING GUIDED PORT INSERTION  09/24/2020   IR RADIOLOGIST EVAL & MGMT  07/02/2021   KNEE SURGERY     LAPAROTOMY N/A 04/12/2021   Procedure: EXPLORATORY LAPAROTOMY, REPAIR OF DUODENAL ULCER WITH Silvestre Gunner;  Surgeon: Felicie Morn, MD;  Location: [facility];  Service: General;  Laterality: N/A;   LARYNGOSCOPY AND BRONCHOSCOPY N/A 04/01/2019   Procedure: BRONCHOSCOPY;  Surgeon: Jodi Marble, MD;  Location: [facility];  Service: ENT;  Laterality: N/A;   RADICAL NECK DISSECTION N/A 01/06/2020   Procedure: RADICAL NECK DISSECTION;  Surgeon: Izora Gala, MD;  [facility];  Service: ENT;  Laterality: N/A;   Family History Family History  Problem Relation Age of Onset   Breast cancer Sister  Colon cancer Brother 27       ????   Cancer Brother     Social History Social History   Tobacco Use   Smoking status: Former    Years: 50.00    Types: Cigarettes    Quit date: 06/01/2020    Years since quitting: 1.3   Smokeless tobacco: Never  Vaping Use   Vaping Use: Never used  Substance Use Topics   Alcohol use: Yes    Alcohol/week: 4.0 standard drinks    Types: 4 Cans of beer per week    Comment: 40oz beer daily   Drug use: Yes    Types: Cocaine    Comment: none in 2 yrs   Allergies Patient has no known allergies.  Review of Systems Review of Systems  All other systems reviewed and are negative.  Physical Exam Vital Signs  I have reviewed the triage vital signs There were no vitals taken for this visit.  Physical Exam Vitals and nursing note reviewed.  Constitutional:      General: He is not in acute distress.    Appearance: He is well-developed.  HENT:     Head: Normocephalic and atraumatic.  Eyes:     Conjunctiva/sclera: Conjunctivae normal.  Cardiovascular:     Rate and Rhythm: Normal rate and regular rhythm.     Heart sounds: No murmur heard. Pulmonary:     Effort: Pulmonary effort is normal. No respiratory distress.     Breath sounds: Normal breath sounds.  Abdominal:     Palpations: Abdomen is soft.     Tenderness: There is no abdominal tenderness.     Comments: G-tube site clean dry and intact with no evidence of infection no active bleeding  Musculoskeletal:        General: No swelling.     Cervical back: Neck supple.  Skin:    General: Skin is warm and dry.     Capillary Refill: Capillary refill takes less than 2 seconds.   Neurological:     Mental Status: He is alert.  Psychiatric:        Mood and Affect: Mood normal.    ED Results and Treatments Labs (all labs ordered are listed, but only abnormal results are displayed) Labs Reviewed - No data to display                                                                                                                        Radiology No results found.  Pertinent labs & imaging results that were available during my care of the patient were reviewed by me and considered in my medical decision making (see MDM for details).  Medications Ordered in ED Medications - No data to display  Procedures Gastrostomy tube replacement  Date/Time: 09/30/2021 3:05 PM Performed by: Teressa Lower, MD Authorized by: Teressa Lower, MD  Consent given by: patient Patient understanding: patient states understanding of the procedure being performed Preparation: Patient was prepped and draped in the usual sterile fashion. Local anesthesia used: no  Anesthesia: Local anesthesia used: no  Sedation: Patient sedated: no  Patient tolerance: patient tolerated the procedure well with no immediate complications Comments: 76 French G-tube attempted to be placed but tract to large, downsized to 22 which also did not fit, downsized to 20 which did fit.  X-ray confirming appropriate placement with no contrast leak.    (including critical care time)  Medical Decision Making / ED Course   This patient presents to the ED for concern of G-tube dislodgment, this involves an extensive number of treatment options, and is a complaint that carries with it a high risk of complications and morbidity.  The differential diagnosis includes G-tube dislodgment, G-tube site infection, viscous injury  MDM: Patient seen in the emergency department for evaluation  of a G-tube dislodgment.  Physical exam reveals a G-tube site that is clean dry and intact with no evidence of infection.  I attempted to replace the G-tube with a 24 [place] and the tract was not large enough and thus I downsized to 22 which did not work and I again downsized to a 33 [place] which ultimately fit.  X-ray G-tube study performed that shows appropriate contrast within the stomach and no extravasation.  Patient then discharged home.   Additional history obtained:  -External records from outside source obtained and reviewed including: Chart review including previous notes, labs, imaging, consultation notes   Lab Tests: -I ordered, reviewed, and interpreted labs.   The pertinent results include:   Labs Reviewed - No data to display    Imaging Studies ordered: I ordered imaging studies including XR G tube study I independently visualized and interpreted imaging. I agree with the radiologist interpretation   Medicines ordered and prescription drug management: No orders of the defined types were placed in this encounter.   -I have reviewed the patients home medicines and have made adjustments as needed  Critical interventions none    Cardiac Monitoring: The patient was maintained on a cardiac monitor.  I personally viewed and interpreted the cardiac monitored which showed an underlying rhythm of: NSR  Social Determinants of Health:  Factors impacting patients care include: none   Reevaluation: After the interventions noted above, I reevaluated the patient and found that they have :improved  Co morbidities that complicate the patient evaluation  Past Medical History:  Diagnosis Date   Cancer ([place])    Throat cancer 2019   ETOH abuse    Frequent urination    Glaucoma    Hepatitis C virus infection without hepatic coma    dx'ed in 11/2018   History of radiation therapy 05/12/19- 07/06/19   Larynx   Hypertension    Wears denture    upper only; lost lower denture       Dispostion: dc     Final Clinical Impression(s) / ED Diagnoses Final diagnoses:  None     @PCDICTATION @    Teressa Lower, MD 09/30/21 1507

## 2021-10-04 ENCOUNTER — Encounter: Payer: Self-pay | Admitting: Hematology and Oncology

## 2021-10-04 ENCOUNTER — Inpatient Hospital Stay (HOSPITAL_BASED_OUTPATIENT_CLINIC_OR_DEPARTMENT_OTHER): Payer: Medicare HMO | Admitting: Hematology and Oncology

## 2021-10-04 ENCOUNTER — Other Ambulatory Visit: Payer: Self-pay

## 2021-10-04 ENCOUNTER — Inpatient Hospital Stay: Payer: Medicare HMO | Admitting: Dietician

## 2021-10-04 DIAGNOSIS — R634 Abnormal weight loss: Secondary | ICD-10-CM

## 2021-10-04 DIAGNOSIS — G893 Neoplasm related pain (acute) (chronic): Secondary | ICD-10-CM

## 2021-10-04 DIAGNOSIS — C321 Malignant neoplasm of supraglottis: Secondary | ICD-10-CM | POA: Diagnosis not present

## 2021-10-04 DIAGNOSIS — Z5111 Encounter for antineoplastic chemotherapy: Secondary | ICD-10-CM | POA: Diagnosis not present

## 2021-10-04 DIAGNOSIS — J189 Pneumonia, unspecified organism: Secondary | ICD-10-CM | POA: Diagnosis not present

## 2021-10-04 NOTE — Assessment & Plan Note (Signed)
He lost about 3 pounds since his last visit.  He has not been using 4 cartons of [employer] as instructed.  Once again reiterated the importance of maintaining nutrition for well tolerance of chemotherapy.  He expressed understanding.

## 2021-10-04 NOTE — Assessment & Plan Note (Signed)
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   After his last imaging, we were concerned about possible pneumonitis from immunotherapy versus recovering COVID-19 infection.  I have discussed with radiology who suggested it could be an ongoing cause such as immunotherapy.  Hence we held immunotherapy, treated him with short course of steroids since we were unsure if this was actually a grade 1 or grade 2 pneumonitis.  We have also given him a short course of antibiotics for possible atypical pneumonia.  He is here for follow-up.  He completed his course of prednisone and antibiotics and he has been feeling well.  He says the shortness of breath is better.  No other complaints.  Physical examination today without any evidence of worsening pneumonitis.  He has scattered bronchial breathing from his underlying COPD which has not changed. He is due for a repeat CT next week and he will return to clinic the following day to discuss if he can continue treatment.

## 2021-10-04 NOTE — Assessment & Plan Note (Signed)
Continue current pain regimen  no changes needed.

## 2021-10-04 NOTE — Progress Notes (Signed)
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 Now with progression. He is currently receiving CarboTaxol and Keytruda.    Met with patient in office. He reports feeling better s/p recent Covid infection followed by pneumonia. Patient reports he has been walking 1.5 miles around neighborhood 2-3 times/week. Patient is sleeping a lot during the day. Patient reports he has been inconsistent with tube feedings but tries to give four cartons of [employer] daily. Patient reports 2 (16 ounce) bottles of water daily via tube. He is not eating or drinking orally. Patient reports he was seen team at [facility] for his swallowing. Plans for esophageal dilation per patient. He reports this has not been scheduled.    Medications: reviewed   Labs: 1/6 labs reviewed   Anthropometrics: Weight 103 lb today decreased from 106 lb 12.8 oz on 1/12  1/6 - 106 lb 6.4 oz  12/16 - 106 lb 14.4 oz 11/28 - 101 lb 8 oz 11/10 - 106 lb 12 oz   2.8% wt decrease in one week; significant    NUTRITION DIAGNOSIS: Severe malnutrition continues    INTERVENTION:  Reinforced on importance of meeting tube feeding goal of 4 cartons [employer] 1.4/day to promote weight gain  Patient agreeable to using phone alarms as feeding reminders to meet daily tube feeding goals Patient reports receiving monthly shipment of formula and supplies on 1/19     MONITORING, EVALUATION, GOAL: weight trends, tube feeding   NEXT VISIT: Friday 1/27 in infusion (pending imaging results)

## 2021-10-04 NOTE — Progress Notes (Signed)
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 SEE NOTE   04/01/2019 Surgery   Pre-Op Dx:   right supraglottic carcinoma   Post-op Dx: T2 N0 M0 (stage II) right supraglottic carcinoma   Proc: Direct laryngoscopy with biopsy.  Cervical esophagoscopy.  Bronchoscopy.   Surg:  [name]    Findings: A bulky necrotic and semi-pedunculated tumor coming off the lateral surface of the right aryepiglottic fold and the anterior and lateral aspect of the piriform sinus on the right side.  Airway was compromised by the tumor mass at intubation.   04/11/2019 Imaging   CT neck: IMPRESSION: The glottis is closed with suboptimal evaluation of the vocal cords. No asymmetry of the cords. Correlate with laryngoscopy results.   No enlarged lymph nodes in the neck. 7 mm right level 2 lymph node may be reactive. No definite pathologic lymph nodes in the neck.   04/11/2019 Imaging   CT neck  This CT was performed after surgical debulking of an exophytic laryngeal tumor.   Most apparent on series 2, image 51 there is abnormal asymmetric  hyperenhancement along the surface of the right piriform sinus, and perhaps also involving the bilateral AE folds.   04/22/2019 Imaging   PET: IMPRESSION: 1. Mild residual activity in the posterior RIGHT hypopharynx confined to the mucosa. 2. No evidence of hypermetabolic metastatic lymph nodes in LEFT or RIGHT neck. 3. No evidence distant metastatic disease.   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 New FDG avid right level 2 cervical lymph node concerning for recurrent disease. No findings of distant metastatic disease within the chest, abdomen or pelvis. 2. Mild nonspecific increased uptake in the right side of larynx is similar to previous exam. 3. Aortic Atherosclerosis (ICD10-I70.0). Coronary artery calcifications.    11/21/2019 Pathology Results   SURGICAL PATHOLOGY  CASE: [id number]  [name]  Surgical Pathology Report   FINAL MICROSCOPIC DIAGNOSIS:   A. LYMPH NODE, RIGHT CERVICAL:  - Poorly differentiated carcinoma consistent with metastatic squamous cell carcinoma.  - See comment   01/06/2020 Pathology Results   SURGICAL PATHOLOGY  CASE: [id number]  [name]  Surgical Pathology Report  Clinical History: metastatic cancer to cervical lymph nodes (cm)   FINAL MICROSCOPIC DIAGNOSIS:   A. LYMPH NODE, RIGHT MODIFIED, DISSECTION:  - Metastatic squamous cell carcinoma in four of nineteen lymph nodes (4/19).  - Largest metastatic nodule is 3.1 cm with 5 mm  extra-nodal extension.  - Lymphovascular space involvement in peri-nodal connective tissue.  - See comment.    01/06/2020 Surgery   PRE-OPERATIVE DIAGNOSIS:  METASTATIC CANCER TO CERVICAL LYMPH NODES   POST-OPERATIVE DIAGNOSIS:  METASTATIC CANCER TO CERVICAL LYMPH NODES   PROCEDURE:  Procedure(s): DIRECT LARYNGOSCOPY MODIFIED RADICAL NECK DISSECTION   SURGEON:  Alan Salts, Adams     SPECIMEN: Right modified neck dissection including levels 2 and 3, including the internal jugular vein, suture marks the inferior jugular vein stump.   07/05/2020 Imaging   1. Sequelae of radiation and right neck dissection from the previous contrasted Neck CT.   2. Asymmetric soft tissue thickening and enhancement along the right lateral pharynx. Although this might be asymmetric mucositis, the appearance is suspicious and recommend direct inspection. NI-RADS category 2a.   3. Suspicious small 6 mm hyperenhancing nodular soft tissue along the posterior margin of the neck dissection at right level 3. NI-RADS category 2 vs 3 - although this is likely too small for imaging guided biopsy. Repeat PET-CT may be most valuable.   4. Mild inflammatory appearing right upper lobe centrilobular ground-glass opacity, new since February. Consider mild or developing right upper lobe infection. Post radiation changes to the lung apices.       08/13/2020 Pathology Results   A. OROPHARNYX, BIOPSY:  - Poorly differentiated squamous cell carcinoma.  - See comment.    08/13/2020 Surgery   POST-OPERATIVE DIAGNOSIS:  Tonsillary Mass History of Laryngeal  Cancer   PROCEDURE:  Procedure(s): DIRECT LARYNGOSCOPY BIOPSY OF OROPHARYNGEAL MASS   08/30/2020 PET scan   1. Recurrence of head neck carcinoma with a broad hypermetabolic pharyngeal mucosal lesion involving the RIGHT lateral posterior oropharynx and hypopharynx extending from the palatine tonsil to the vallecula. 2. Hypermetabolic lymph node posterior  sternocleidomastoid muscle on the RIGHT (level 3). 3. Hypermetabolic nodule within the LEFT parotid glands favored small primary parotid neoplasm. 4. No evidence thoracic metastasis   09/24/2020 - 10/30/2020 Chemotherapy   He received weekly cisplatin   01/31/2021 PET scan   Findings of worsening nodal disease with a node or group of nodes in the LEFT neck and a RIGHT supraclavicular lymph node as discussed.   Post treatment changes with soft tissue fullness in the area of the prior tumor but with markedly diminished metabolic activity.     03/11/2021 Imaging   Ct neck  Diffuse pharyngeal and laryngeal mucosal and submucosal edema probably related to acute radiation injury. The airway is narrow and could possibly be critically narrowed. I do not see any evidence of recurrent mucosal or submucosal tumor identifiable.   Similar appearance of the recurrent malignant lymphadenopathy on the left at level 2 and level 3 and on the right in the low supraclavicular region just above and behind the clavicular head.   04/12/2021 Imaging   Ct abdomen and pelvis  1. Contrast material in the peritoneal cavity of the upper abdomen suggesting leak along the gastrostomy tract. 2. Gastrostomy tube is appropriately positioned. No evidence of abscess. 3. 1.3 cm right middle lobe nodule, new since 01/31/2021. Favor infectious/inflammatory over primary/secondary neoplasm given the relatively short time frame of development.   04/12/2021 Surgery   Postoperative Diagnosis: Perforated duodenal ulcer (1st  portion)   Surgical Procedure:  Modified Graham patch of duodenal ulcer Gastrostomy tube exchange   Operative Team Members:  Surgeon(s) and Role:    * Stechschulte, Nickola Major, Adams - Primary     Drains:  (19 Fr) Jackson-Pratt drain(s) with closed bulb suction in the right upper quadrant near the duodenal ulcer repair and Gastrostomy Tube 26 fr   05/09/2021 Imaging   1. Progression of lesion in the right tonsil  with central fluid collection and peripheral enhancement. This was a site of prior recurrent sick could represent progressive tumor or abscess. Continued follow-up recommended. 2. Progressive malignant lymphadenopathy in the left neck. Stable lymphadenopathy right supraclavicular region. 3. New area of stranding in the superior segment left lower lobe but certain etiology. Attention on follow-up recommended.     06/13/2021 -  Chemotherapy   Patient is on Treatment Plan :  Paclitaxel + Carboplatin q7d     06/13/2021 -  Chemotherapy   Patient is on Treatment Plan : HEAD/NECK Pembrolizumab Q21D     07/18/2021 Imaging   Ct chest 1. No highly specific findings identified to suggest pulmonary or nodal metastasis within the chest 2. Multifocal patchy areas of ground-glass and nodular airspace densities. Surrounding postinflammatory changes are noted with thickening of the peribronchovascular interstitium. Imaging findings are concerning for sequelae of aspiration and/or multifocal infection. Follow-up imaging after appropriate antibiotic therapy is recommended with repeat CT of the chest in 3 months to ensure resolution and to rule out the possibility of underlying metastatic nodule. 3. Coronary artery calcifications. 4. Aortic Atherosclerosis (ICD10-I70.0) and Emphysema (ICD10-J43.9).   07/18/2021 Imaging   CT neck  1. Positive treatment response with decreased size of nodal metastases in the left jugular chain. There is likely residual active tumor in the treated nodes. 2. Ulcerated lesions in the bilateral palatine fossa, progressed on the left and suspicious for synchronous tumors.   07/23/2021 Cancer Staging   Staging form: Larynx - Supraglottis, AJCC 8th Edition - Clinical stage from 07/23/2021: Stage IVA (rcT2, cN2b, cM0) - Signed by Alan Lark, Adams on 07/23/2021 Stage prefix: Recurrence    09/19/2021 Imaging   IMPRESSION: 1. Positive treatment response at the cervical lymph nodes.  No residual nodal disease identified currently. 2. Persisting bilateral palatine tonsil lesion with mild size increase on the right.     09/19/2021 Imaging   IMPRESSION: No new significant lymphadenopathy seen in the mediastinum.   There is interval increase in infiltrates at multiple sites in both lungs as described in the body of the report. There is interval clearing of small patchy infiltrates in right lower lobe. Findings suggest possible waxing and waning multifocal pneumonia.   There are few subcentimeter nodules as described in the body of the report which may be part of pneumonia or neoplastic process. Short-term follow-up CT in 3 months may be considered. Coronary artery calcifications are seen. There is ectasia of ascending thoracic aorta.     CURRENT THERAPY: Taxol, Carbo, Keytruda  INTERVAL HISTORY:  [name] 72 y.o. male returns for evaluation for follow up. Since last visit, he says his shortness of breath is better.  He completed the prednisone for 10 days as well as antibiotics.  No other new complaints.  His pain is well controlled on the current pain regimen.  He has not been able to eat anything, followed up with speech therapy for swallow evaluation however he does not know the report.  He has only been using about 2 cartons of [employer]  via his G-tube.  He was last seen in the ED a couple days ago because his G-tube dislodged and he had a new one placed.  Rest of the pertinent 10 point ROS reviewed and negative.   Patient Active Problem List   Diagnosis Date Noted   Pneumonitis 09/23/2021   Hypomagnesemia 08/05/2021   Dysphagia 07/24/2021   Neuropathy due to chemotherapeutic drug ([place]) 07/04/2021   Weight loss, unintentional 06/20/2021   Protein-calorie malnutrition, severe 04/17/2021   Perforated gastric ulcer ([place])    Gastrostomy tube dysfunction ([place]) 04/12/2021   Anemia due to antineoplastic chemotherapy 03/26/2021   Cancer related pain  03/26/2021   Laryngeal edema 03/11/2021   Nausea and vomiting 01/24/2021   Shortness of breath 01/10/2021   Mucositis due to antineoplastic therapy 01/10/2021   G tube feedings ([place]) 01/10/2021   Chemotherapy induced diarrhea 10/30/2020   Port-A-Cath in place 10/23/2020   Chronic hepatitis C without hepatic coma ([place]) 06/09/2019   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  MEDICAL HISTORY: Past Medical History:  Diagnosis Date   Cancer ([place])    Throat cancer 2019   ETOH abuse    Frequent urination    Glaucoma    Hepatitis C virus infection without hepatic coma    dx'ed in 11/2018   History of radiation therapy 05/12/19- 07/06/19   Larynx   Hypertension    Wears denture    upper only; lost lower denture    SURGICAL HISTORY: Past Surgical History:  Procedure Laterality Date   ANKLE SURGERY  2011   right ankle   COLONOSCOPY  02/2019   polyps - Dr Havery Moros   DIRECT LARYNGOSCOPY N/A 04/01/2019   Procedure: DIRECT LARYNGOSCOPY WITH BIOPSY;  Surgeon: Alan Marble, Adams;  [facility];  Service: ENT;  Laterality: N/A;   DIRECT LARYNGOSCOPY N/A 01/06/2020   Procedure: DIRECT LARYNGOSCOPY;  Surgeon: Izora Gala, Adams;  Location: [facility];  Service: ENT;  Laterality: N/A;   DIRECT LARYNGOSCOPY N/A 08/13/2020   Procedure: DIRECT LARYNGOSCOPY;  Surgeon: Izora Gala, Adams;  Location: [facility];  Service: ENT;  Laterality: N/A;   ESOPHAGOSCOPY N/A 04/01/2019   Procedure: ESOPHAGOSCOPY;  Surgeon: Alan Marble, Adams;  Location: [place] [facility];  Service: ENT;  Laterality: N/A;   EXCISION ORAL TUMOR Right 08/13/2020   Procedure: BIOPSY OF OROPHARYNGEAL MASS;  Surgeon: Izora Gala, Adams;  Location: [facility];  Service: ENT;  Laterality: Right;   EYE SURGERY Right    IR CM INJ ANY COLONIC TUBE W/FLUORO  12/20/2020   IR CM INJ ANY COLONIC TUBE W/FLUORO  03/20/2021   IR GASTROSTOMY TUBE MOD SED  09/24/2020   IR IMAGING  GUIDED PORT INSERTION  09/24/2020   IR RADIOLOGIST EVAL & MGMT  07/02/2021   KNEE SURGERY     LAPAROTOMY N/A 04/12/2021   Procedure: EXPLORATORY LAPAROTOMY, REPAIR OF DUODENAL ULCER WITH Silvestre Gunner;  Surgeon: Felicie Morn, Adams;  Location: [facility];  Service: General;  Laterality: N/A;   LARYNGOSCOPY AND BRONCHOSCOPY N/A 04/01/2019   Procedure: BRONCHOSCOPY;  Surgeon: Alan Marble, Adams;  Location: [facility];  Service: ENT;  Laterality: N/A;   RADICAL NECK DISSECTION N/A 01/06/2020   Procedure: RADICAL NECK DISSECTION;  Surgeon: Izora Gala, Adams;  Location: [facility];  Service: ENT;  Laterality: N/A;    SOCIAL HISTORY: Social History   Socioeconomic History   Marital status: Single    Spouse name: Not on file  Number of children: 2   Years of education: Not on file   Highest education level: Not on file  Occupational History   Not on file  Tobacco Use   Smoking status: Former    Years: 50.00    Types: Cigarettes    Quit date: 06/01/2020    Years since quitting: 1.3   Smokeless tobacco: Never  Vaping Use   Vaping Use: Never used  Substance and Sexual Activity   Alcohol use: Yes    Alcohol/week: 4.0 standard drinks    Types: 4 Cans of beer per week    Comment: 40oz beer daily   Drug use: Yes    Types: Cocaine    Comment: none in 2 yrs   Sexual activity: Yes    Partners: Female  Other Topics Concern   Not on file  Social History Narrative   Patient is divorced with 2 children.   Patient is currently living with his sister.   Patient with a history of smoking a third of pack of cigarettes daily for 50 years.  Patient currently smoking 2 to 3 cigarettes/day.   Patient has never used smokeless tobacco.   Patient with occasional use of alcohol.   Patient last used cocaine approximately 6 months ago.  Patient denies use of marijuana.   Social Determinants of Health   Financial Resource Strain: Not on file  Food Insecurity: Not on file  Transportation Needs: Not on file   Physical Activity: Not on file  Stress: Not on file  Social Connections: Not on file  Intimate Partner Violence: Not on file    FAMILY HISTORY: Family History  Problem Relation Age of Onset   Breast cancer Sister    Colon cancer Brother 56       ????   Cancer Brother     Review of Systems  Constitutional:  Positive for fatigue (Mild). Negative for appetite change, chills, fever and unexpected weight change.  HENT:   Positive for sore throat. Negative for hearing loss, lump/mass, mouth sores and trouble swallowing.   Eyes:  Negative for eye problems and icterus.  Respiratory:  Negative for chest tightness, cough and shortness of breath.   Cardiovascular:  Negative for chest pain, leg swelling and palpitations.  Gastrointestinal:  Negative for abdominal distention, abdominal pain, constipation, diarrhea, nausea and vomiting.  Endocrine: Negative for hot flashes.  Genitourinary:  Negative for difficulty urinating.   Musculoskeletal:  Negative for arthralgias.  Skin:  Negative for itching and rash.  Neurological:  Negative for dizziness, extremity weakness, headaches and numbness.  Hematological:  Negative for adenopathy. Does not bruise/bleed easily.  Psychiatric/Behavioral:  Negative for depression. The patient is not nervous/anxious.      PHYSICAL EXAMINATION  ECOG PERFORMANCE STATUS: 2 - Symptomatic, <50% confined to bed  Vitals:   10/04/21 0940  BP: 122/88  Pulse: 98  Resp: 18  Temp: 98.8 F (37.1 C)  SpO2: 100%    Physical Exam Constitutional:      General: He is not in acute distress.    Appearance: Normal appearance. He is not toxic-appearing.  HENT:     Head: Normocephalic and atraumatic.  Eyes:     General: No scleral icterus. Cardiovascular:     Rate and Rhythm: Normal rate and regular rhythm.     Pulses: Normal pulses.     Heart sounds: Normal heart sounds.  Pulmonary:     Effort: Pulmonary effort is normal.     Breath sounds: Normal breath sounds.  Comments: He has baseline scattered bronchial breathing from his underlying COPD.  No change.  No crackles noted. Abdominal:     General: Abdomen is flat. Bowel sounds are normal. There is no distension.     Palpations: Abdomen is soft.     Tenderness: There is no abdominal tenderness.  Musculoskeletal:        General: No swelling.     Cervical back: Neck supple.  Lymphadenopathy:     Cervical: No cervical adenopathy.  Skin:    General: Skin is warm and dry.     Findings: No rash.  Neurological:     General: No focal deficit present.     Mental Status: He is alert.  Psychiatric:        Mood and Affect: Mood normal.        Behavior: Behavior normal.    LABORATORY DATA:  CBC    Component Value Date/Time   WBC 3.5 (L) 09/20/2021 1335   RBC 3.14 (L) 09/20/2021 1335   HGB 10.3 (L) 09/20/2021 1335   HGB 10.4 (L) 07/04/2021 1025   HCT 31.7 (L) 09/20/2021 1335   PLT 259 09/20/2021 1335   PLT 305 07/04/2021 1025   MCV 101.0 (H) 09/20/2021 1335   MCH 32.8 09/20/2021 1335   MCHC 32.5 09/20/2021 1335   RDW 16.5 (H) 09/20/2021 1335   LYMPHSABS 0.8 09/20/2021 1335   MONOABS 0.5 09/20/2021 1335   EOSABS 0.1 09/20/2021 1335   BASOSABS 0.0 09/20/2021 1335    CMP     Component Value Date/Time   NA 138 09/20/2021 1335   K 4.0 09/20/2021 1335   CL 102 09/20/2021 1335   CO2 29 09/20/2021 1335   GLUCOSE 118 (H) 09/20/2021 1335   BUN 17 09/20/2021 1335   CREATININE 0.45 (L) 09/20/2021 1335   CREATININE 0.59 (L) 07/04/2021 1025   CREATININE 0.61 (L) 10/26/2019 1523   CALCIUM 9.2 09/20/2021 1335   PROT 7.4 09/20/2021 1335   ALBUMIN 3.9 09/20/2021 1335   AST 15 09/20/2021 1335   AST 10 (L) 07/04/2021 1025   ALT 8 09/20/2021 1335   ALT 7 07/04/2021 1025   ALT 53 (H) 09/06/2019 1543   ALKPHOS 44 09/20/2021 1335   BILITOT 0.4 09/20/2021 1335   BILITOT 0.2 (L) 07/04/2021 1025   GFRNONAA >60 09/20/2021 1335   GFRNONAA >60 07/04/2021 1025   GFRAA >60 01/03/2020 1355   GFRAA  >60 05/04/2019 1207     ASSESSMENT and THERAPY PLAN:   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 Macomber is a patient with complicated history of supraglottic tumor with cancer recurrence in the head and neck region currently on weekly CarboTaxol and every 21-day Keytruda.   After his last imaging, we were concerned about possible pneumonitis from immunotherapy versus recovering COVID-19 infection.  I have discussed with radiology who suggested it could be an ongoing cause such as immunotherapy.  Hence we held immunotherapy, treated him with short course of steroids since we were unsure if this was actually a grade 1 or grade 2 pneumonitis.  We have also given him a short course of antibiotics for possible atypical pneumonia.  He is here for follow-up.  He completed his course of prednisone and antibiotics and he has been feeling well.  He says the shortness of breath is better.  No other complaints.  Physical examination today without any evidence of worsening pneumonitis.  He has scattered bronchial breathing from his underlying COPD which has not changed. He  is due for a repeat CT next week and he will return to clinic the following day to discuss if he can continue treatment.  Weight loss, unintentional He lost about 3 pounds since his last visit.  He has not been using 4 cartons of [employer] as instructed.  Once again reiterated the importance of maintaining nutrition for well tolerance of chemotherapy.  He expressed understanding.  Cancer related pain Continue current pain regimen  no changes needed.  Pneumonitis Pneumonitis, grade 1-2.  It is unclear if this could be atypical infection versus immunotherapy related or recovering COVID-19 infection.  However we have treated him empirically for possible atypical pneumonia or mild pneumonitis related to immunotherapy.  No concerns today for worsening pneumonitis.  Repeat CT imaging pending at this time.   All questions  were answered. The patient knows to call the clinic with any problems, questions or concerns. We can certainly see the patient much sooner if necessary.  Total encounter time: 30 minutes in face-to-face visit time, chart review, lab review, care coordination, and documentation of the encounter.  *Total Encounter Time as defined by the Centers for Medicare and Medicaid Services includes, in addition to the face-to-face time of a patient visit (documented in the note above) non-face-to-face time: obtaining and reviewing outside history, ordering and reviewing medications, tests or procedures, care coordination (communications with other health care professionals or caregivers) and documentation in the medical record.

## 2021-10-04 NOTE — Assessment & Plan Note (Signed)
Pneumonitis, grade 1-2.  It is unclear if this could be atypical infection versus immunotherapy related or recovering COVID-19 infection.  However we have treated him empirically for possible atypical pneumonia or mild pneumonitis related to immunotherapy.  No concerns today for worsening pneumonitis.  Repeat CT imaging pending at this time.

## 2021-10-10 ENCOUNTER — Other Ambulatory Visit: Payer: Self-pay | Admitting: Hematology and Oncology

## 2021-10-10 ENCOUNTER — Other Ambulatory Visit: Payer: Self-pay

## 2021-10-10 ENCOUNTER — Ambulatory Visit (HOSPITAL_COMMUNITY)
Admission: RE | Admit: 2021-10-10 | Discharge: 2021-10-10 | Disposition: A | Discharge status: Home / Self Care | Payer: Medicare HMO | Source: Ambulatory Visit | Attending: Hematology and Oncology | Admitting: Hematology and Oncology

## 2021-10-10 DIAGNOSIS — J189 Pneumonia, unspecified organism: Secondary | ICD-10-CM | POA: Diagnosis present

## 2021-10-10 DIAGNOSIS — R634 Abnormal weight loss: Secondary | ICD-10-CM

## 2021-10-10 DIAGNOSIS — C321 Malignant neoplasm of supraglottis: Secondary | ICD-10-CM

## 2021-10-10 MED FILL — Dexamethasone Sodium Phosphate Inj 100 MG/10ML: INTRAMUSCULAR | Qty: 1 | Status: AC

## 2021-10-11 ENCOUNTER — Encounter: Payer: Self-pay | Admitting: Hematology and Oncology

## 2021-10-11 ENCOUNTER — Inpatient Hospital Stay: Payer: Medicare HMO

## 2021-10-11 ENCOUNTER — Inpatient Hospital Stay (HOSPITAL_BASED_OUTPATIENT_CLINIC_OR_DEPARTMENT_OTHER): Payer: Medicare HMO | Admitting: Hematology and Oncology

## 2021-10-11 ENCOUNTER — Inpatient Hospital Stay: Payer: Medicare HMO | Admitting: Dietician

## 2021-10-11 VITALS — HR 92 | Temp 99.0°F

## 2021-10-11 DIAGNOSIS — J189 Pneumonia, unspecified organism: Secondary | ICD-10-CM | POA: Diagnosis not present

## 2021-10-11 DIAGNOSIS — C321 Malignant neoplasm of supraglottis: Secondary | ICD-10-CM

## 2021-10-11 DIAGNOSIS — Z7189 Other specified counseling: Secondary | ICD-10-CM | POA: Insufficient documentation

## 2021-10-11 DIAGNOSIS — R634 Abnormal weight loss: Secondary | ICD-10-CM

## 2021-10-11 DIAGNOSIS — Z5112 Encounter for antineoplastic immunotherapy: Secondary | ICD-10-CM | POA: Insufficient documentation

## 2021-10-11 DIAGNOSIS — G893 Neoplasm related pain (acute) (chronic): Secondary | ICD-10-CM

## 2021-10-11 DIAGNOSIS — Z95828 Presence of other vascular implants and grafts: Secondary | ICD-10-CM

## 2021-10-11 DIAGNOSIS — Z5111 Encounter for antineoplastic chemotherapy: Secondary | ICD-10-CM | POA: Diagnosis not present

## 2021-10-11 LAB — COMPREHENSIVE METABOLIC PANEL
ALT: 9 U/L (ref 0–44)
AST: 15 U/L (ref 15–41)
Albumin: 3.9 g/dL (ref 3.5–5.0)
Alkaline Phosphatase: 39 U/L (ref 38–126)
Anion gap: 6 (ref 5–15)
BUN: 19 mg/dL (ref 8–23)
CO2: 28 mmol/L (ref 22–32)
Calcium: 9.1 mg/dL (ref 8.9–10.3)
Chloride: 101 mmol/L (ref 98–111)
Creatinine, Ser: 0.54 mg/dL — ABNORMAL LOW (ref 0.61–1.24)
GFR, Estimated: >60 mL/min (ref >60)
Glucose, Bld: 145 mg/dL — ABNORMAL HIGH (ref 70–99)
Potassium: 4.3 mmol/L (ref 3.5–5.1)
Sodium: 135 mmol/L (ref 135–145)
Total Bilirubin: 0.5 mg/dL (ref 0.3–1.2)
Total Protein: 7.1 g/dL (ref 6.5–8.1)

## 2021-10-11 LAB — CBC WITH DIFFERENTIAL/PLATELET
Abs Immature Granulocytes: 0.03 10*3/uL (ref 0.00–0.07)
Basophils Absolute: 0.1 10*3/uL (ref 0.0–0.1)
Basophils Relative: 1 %
Eosinophils Absolute: 0.1 10*3/uL (ref 0.0–0.5)
Eosinophils Relative: 3 %
HCT: 33.4 % — ABNORMAL LOW (ref 39.0–52.0)
Hemoglobin: 11.3 g/dL — ABNORMAL LOW (ref 13.0–17.0)
Immature Granulocytes: 1 %
Lymphocytes Relative: 17 %
Lymphs Abs: 0.8 10*3/uL (ref 0.7–4.0)
MCH: 33.7 pg (ref 26.0–34.0)
MCHC: 33.8 g/dL (ref 30.0–36.0)
MCV: 99.7 fL (ref 80.0–100.0)
Monocytes Absolute: 0.6 10*3/uL (ref 0.1–1.0)
Monocytes Relative: 12 %
Neutro Abs: 3.1 10*3/uL (ref 1.7–7.7)
Neutrophils Relative %: 66 %
Platelets: 203 10*3/uL (ref 150–400)
RBC: 3.35 MIL/uL — ABNORMAL LOW (ref 4.22–5.81)
RDW: 14 % (ref 11.5–15.5)
WBC: 4.6 10*3/uL (ref 4.0–10.5)
nRBC: 0 % (ref 0.0–0.2)

## 2021-10-11 MED ORDER — HEPARIN SOD (PORK) LOCK FLUSH 100 UNIT/ML IV SOLN
500.0000 [IU] | Freq: Once | INTRAVENOUS | Status: AC | PRN
  Administered 2021-10-11: 500 [IU]

## 2021-10-11 MED ORDER — PALONOSETRON HCL INJECTION 0.25 MG/5ML
0.2500 mg | Freq: Once | INTRAVENOUS | Status: AC
  Administered 2021-10-11: 0.25 mg via INTRAVENOUS
  Filled 2021-10-11: qty 5

## 2021-10-11 MED ORDER — FAMOTIDINE 20 MG IN NS 100 ML IVPB
20.0000 mg | Freq: Once | INTRAVENOUS | Status: AC
  Administered 2021-10-11: 20 mg via INTRAVENOUS
  Filled 2021-10-11: qty 100

## 2021-10-11 MED ORDER — SODIUM CHLORIDE 0.9% FLUSH
10.0000 mL | INTRAVENOUS | Status: DC | PRN
  Administered 2021-10-11: 10 mL

## 2021-10-11 MED ORDER — SODIUM CHLORIDE 0.9 % IV SOLN: Freq: Once | INTRAVENOUS | Status: AC

## 2021-10-11 MED ORDER — SODIUM CHLORIDE 0.9 % IV SOLN
10.0000 mg | Freq: Once | INTRAVENOUS | Status: AC
  Administered 2021-10-11: 10 mg via INTRAVENOUS
  Filled 2021-10-11: qty 10

## 2021-10-11 MED ORDER — SODIUM CHLORIDE 0.9 % IV SOLN
50.0000 mg/m2 | Freq: Once | INTRAVENOUS | Status: AC
  Administered 2021-10-11: 78 mg via INTRAVENOUS
  Filled 2021-10-11: qty 13

## 2021-10-11 MED ORDER — OXYCODONE HCL 5 MG PO TABS: 5.0000 mg | ORAL_TABLET | Freq: Four times a day (QID) | ORAL | 0 refills | Status: DC | PRN

## 2021-10-11 MED ORDER — DIPHENHYDRAMINE HCL 50 MG/ML IJ SOLN
50.0000 mg | Freq: Once | INTRAMUSCULAR | Status: AC
  Administered 2021-10-11: 50 mg via INTRAVENOUS
  Filled 2021-10-11: qty 1

## 2021-10-11 MED ORDER — SODIUM CHLORIDE 0.9 % IV SOLN
110.0000 mg | Freq: Once | INTRAVENOUS | Status: AC
  Administered 2021-10-11: 110 mg via INTRAVENOUS
  Filled 2021-10-11: qty 11

## 2021-10-11 MED ORDER — SODIUM CHLORIDE 0.9 % IV SOLN: 200.0000 mg | Freq: Once | INTRAVENOUS | Status: DC

## 2021-10-11 MED ORDER — SODIUM CHLORIDE 0.9% FLUSH
10.0000 mL | Freq: Once | INTRAVENOUS | Status: AC
  Administered 2021-10-11: 10 mL

## 2021-10-11 NOTE — Assessment & Plan Note (Signed)
Since last visit, he has gained 4 pounds of weight which is phenomenal.  Encouraged him to continue G-tube feeding.  He is not supposed to take anything by mouth based on speech therapy evaluation.

## 2021-10-11 NOTE — Progress Notes (Signed)
Nutrition Follow-up:  Patient receiving CarboTaxol and Keytruda for laryngeal cancer.   Met with patient during infusion. He reports appointment with [facility] completed yesterday (1/26). Patient reports they did not feel esophageal dilation would be beneficial. Patient has been more consistent with tube feedings. He reports giving 4 cartons of [employer] most days. Patient reports one episode of vomiting Sunday evening after tube feeding. Patient reports laying down directly after bolus feeding.   Medications: reviewed   Labs: Glucose 145, Cr 0.54  Anthropometrics: Patient 107 lb 3.2 oz today   1/20 - 103 lb 1/12 - 106 lb 12.8 oz 1/06 - 106 lb 6.4 oz  12/16 - 106 lb 14.4 oz    NUTRITION DIAGNOSIS: Severe malnutrition continues    INTERVENTION:  Continue 4 cartons [employer] 1.4 via tube daily    MONITORING, EVALUATION, GOAL: weight trends, tube feedings   NEXT VISIT: Friday February 24 during infusion

## 2021-10-11 NOTE — Progress Notes (Signed)
Ok to proceed with [place] today. Upon further review, MD would like to further discuss CT results with radiology before resuming Keytruda. Hold Keytruda today.  Raul Del [place], [place], BCPS, BCOP 10/11/2021 3:14 PM

## 2021-10-11 NOTE — Assessment & Plan Note (Addendum)
Pneumonitis, grade 1 most likely. It is unclear if this could be atypical infection versus immunotherapy related or recovering COVID-19 infection.  However we have treated him empirically for possible atypical pneumonia or mild pneumonitis related to immunotherapy.  No concerns today for worsening pneumonitis.  Repeat CT appears to be improving.  His current symptoms have resolved, hence we can attempt rechallenge of immunotherapy. He understands symptoms can recur and occasionally become severe and life threatening upon rechallenge.

## 2021-10-11 NOTE — Assessment & Plan Note (Signed)
Continue oxycodone for pain management.  No change in pain needs.  Prescription has been refilled today

## 2021-10-11 NOTE — Progress Notes (Signed)
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 SEE NOTE   04/01/2019 Surgery   Pre-Op Dx:   right supraglottic carcinoma   Post-op Dx: T2 N0 M0 (stage II) right supraglottic carcinoma   Proc: Direct laryngoscopy with biopsy.  Cervical esophagoscopy.  Bronchoscopy.   Surg:  Jodi Marble T Alan    Findings: A bulky necrotic and semi-pedunculated tumor coming off the lateral surface of the right aryepiglottic fold and the anterior and lateral aspect of the piriform sinus on the right side.  Airway was compromised by the tumor mass at intubation.   04/11/2019 Imaging   CT neck: IMPRESSION: The glottis is closed with suboptimal evaluation of the vocal cords. No asymmetry of the cords. Correlate with laryngoscopy results.   No enlarged lymph nodes in the neck. 7 mm right level 2 lymph node may be reactive. No definite pathologic lymph nodes in the neck.   04/11/2019 Imaging   CT neck  This CT was performed after surgical debulking of an exophytic laryngeal tumor.   Most apparent on series 2, image 51 there is abnormal asymmetric  hyperenhancement along the surface of the right piriform sinus, and perhaps also involving the bilateral AE folds.   04/22/2019 Imaging   PET: IMPRESSION: 1. Mild residual activity in the posterior RIGHT hypopharynx confined to the mucosa. 2. No evidence of hypermetabolic metastatic lymph nodes in LEFT or RIGHT neck. 3. No evidence distant metastatic disease.   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 New FDG avid right level 2 cervical lymph node concerning for recurrent disease. No findings of distant metastatic disease within the chest, abdomen or pelvis. 2. Mild nonspecific increased uptake in the right side of larynx is similar to previous exam. 3. Aortic Atherosclerosis (ICD10-I70.0). Coronary artery calcifications.    11/21/2019 Pathology Results   SURGICAL PATHOLOGY  CASE: [id number]  [name]  Surgical Pathology Report   FINAL MICROSCOPIC DIAGNOSIS:   A. LYMPH NODE, RIGHT CERVICAL:  - Poorly differentiated carcinoma consistent with metastatic squamous cell carcinoma.  - See comment   01/06/2020 Pathology Results   SURGICAL PATHOLOGY  CASE: [id number]  PATIENT: [place]  Surgical Pathology Report  Clinical History: metastatic cancer to cervical lymph nodes (cm)   FINAL MICROSCOPIC DIAGNOSIS:   A. LYMPH NODE, RIGHT MODIFIED, DISSECTION:  - Metastatic squamous cell carcinoma in four of nineteen lymph nodes (4/19).  - Largest metastatic nodule is 3.1 cm with 5 mm  extra-nodal extension.  - Lymphovascular space involvement in peri-nodal connective tissue.  - See comment.    01/06/2020 Surgery   PRE-OPERATIVE DIAGNOSIS:  METASTATIC CANCER TO CERVICAL LYMPH NODES   POST-OPERATIVE DIAGNOSIS:  METASTATIC CANCER TO CERVICAL LYMPH NODES   PROCEDURE:  Procedure(s): DIRECT LARYNGOSCOPY MODIFIED RADICAL NECK DISSECTION   SURGEON:  [name], Alan     SPECIMEN: Right modified neck dissection including levels 2 and 3, including the internal jugular vein, suture marks the inferior jugular vein stump.   07/05/2020 Imaging   1. Sequelae of radiation and right neck dissection from the previous contrasted Neck CT.   2. Asymmetric soft tissue thickening and enhancement along the right lateral pharynx. Although this might be asymmetric mucositis, the appearance is suspicious and recommend direct inspection. NI-RADS category 2a.   3. Suspicious small 6 mm hyperenhancing nodular soft tissue along the posterior margin of the neck dissection at right level 3. NI-RADS category 2 vs 3 - although this is likely too small for imaging guided biopsy. Repeat PET-CT may be most valuable.   4. Mild inflammatory appearing right upper lobe centrilobular ground-glass opacity, new since February. Consider mild or developing right upper lobe infection. Post radiation changes to the lung apices.       08/13/2020 Pathology Results   A. OROPHARNYX, BIOPSY:  - Poorly differentiated squamous cell carcinoma.  - See comment.    08/13/2020 Surgery   POST-OPERATIVE DIAGNOSIS:  Tonsillary Mass History of Laryngeal  Cancer   PROCEDURE:  Procedure(s): DIRECT LARYNGOSCOPY BIOPSY OF OROPHARYNGEAL MASS   08/30/2020 PET scan   1. Recurrence of head neck carcinoma with a broad hypermetabolic pharyngeal mucosal lesion involving the RIGHT lateral posterior oropharynx and hypopharynx extending from the palatine tonsil to the vallecula. 2. Hypermetabolic lymph node posterior  sternocleidomastoid muscle on the RIGHT (level 3). 3. Hypermetabolic nodule within the LEFT parotid glands favored small primary parotid neoplasm. 4. No evidence thoracic metastasis   09/24/2020 - 10/30/2020 Chemotherapy   He received weekly cisplatin   01/31/2021 PET scan   Findings of worsening nodal disease with a node or group of nodes in the LEFT neck and a RIGHT supraclavicular lymph node as discussed.   Post treatment changes with soft tissue fullness in the area of the prior tumor but with markedly diminished metabolic activity.     03/11/2021 Imaging   Ct neck  Diffuse pharyngeal and laryngeal mucosal and submucosal edema probably related to acute radiation injury. The airway is narrow and could possibly be critically narrowed. I do not see any evidence of recurrent mucosal or submucosal tumor identifiable.   Similar appearance of the recurrent malignant lymphadenopathy on the left at level 2 and level 3 and on the right in the low supraclavicular region just above and behind the clavicular head.   04/12/2021 Imaging   Ct abdomen and pelvis  1. Contrast material in the peritoneal cavity of the upper abdomen suggesting leak along the gastrostomy tract. 2. Gastrostomy tube is appropriately positioned. No evidence of abscess. 3. 1.3 cm right middle lobe nodule, new since 01/31/2021. Favor infectious/inflammatory over primary/secondary neoplasm given the relatively short time frame of development.   04/12/2021 Surgery   Postoperative Diagnosis: Perforated duodenal ulcer (1st  portion)   Surgical Procedure:  Modified Graham patch of duodenal ulcer Gastrostomy tube exchange   Operative Team Members:  Surgeon(s) and Role:    * Stechschulte, Nickola Major, Alan - Primary     Drains:  (19 Fr) Jackson-Pratt drain(s) with closed bulb suction in the right upper quadrant near the duodenal ulcer repair and Gastrostomy Tube 26 fr   05/09/2021 Imaging   1. Progression of lesion in the right tonsil  with central fluid collection and peripheral enhancement. This was a site of prior recurrent sick could represent progressive tumor or abscess. Continued follow-Adams recommended. 2. Progressive malignant lymphadenopathy in the left neck. Stable lymphadenopathy right supraclavicular region. 3. New area of stranding in the superior segment left lower lobe but certain etiology. Attention on follow-Adams recommended.     06/13/2021 -  Chemotherapy   Patient is on Treatment Plan :  Paclitaxel + Carboplatin q7d     06/13/2021 -  Chemotherapy   Patient is on Treatment Plan : HEAD/NECK Pembrolizumab Q21D     07/18/2021 Imaging   Ct chest 1. No highly specific findings identified to suggest pulmonary or nodal metastasis within the chest 2. Multifocal patchy areas of ground-glass and nodular airspace densities. Surrounding postinflammatory changes are noted with thickening of the peribronchovascular interstitium. Imaging findings are concerning for sequelae of aspiration and/or multifocal infection. Follow-Adams imaging after appropriate antibiotic therapy is recommended with repeat CT of the chest in 3 months to ensure resolution and to rule out the possibility of underlying metastatic nodule. 3. Coronary artery calcifications. 4. Aortic Atherosclerosis (ICD10-I70.0) and Emphysema (ICD10-J43.9).   07/18/2021 Imaging   CT neck  1. Positive treatment response with decreased size of nodal metastases in the left jugular chain. There is likely residual active tumor in the treated nodes. 2. Ulcerated lesions in the bilateral palatine fossa, progressed on the left and suspicious for synchronous tumors.   07/23/2021 Cancer Staging   Staging form: Larynx - Supraglottis, AJCC 8th Edition - Clinical stage from 07/23/2021: Stage IVA (rcT2, cN2b, cM0) - Signed by [name], Alan on 07/23/2021 Stage prefix: Recurrence    09/19/2021 Imaging   IMPRESSION: 1. Positive treatment response at the cervical lymph nodes.  No residual nodal disease identified currently. 2. Persisting bilateral palatine tonsil lesion with mild size increase on the right.     09/19/2021 Imaging   IMPRESSION: No new significant lymphadenopathy seen in the mediastinum.   There is interval increase in infiltrates at multiple sites in both lungs as described in the body of the report. There is interval clearing of small patchy infiltrates in right lower lobe. Findings suggest possible waxing and waning multifocal pneumonia.   There are few subcentimeter nodules as described in the body of the report which may be part of pneumonia or neoplastic process. Short-term follow-Adams CT in 3 months may be considered. Coronary artery calcifications are seen. There is ectasia of ascending thoracic aorta.     CURRENT THERAPY: Taxol, Carbo, Keytruda  INTERVAL HISTORY:  [name] 71 y.o. male returns for evaluation for follow Adams. Since his last visit, he has been doing better.  He complains of some ongoing pain in his neck otherwise he denies any worsening shortness of breath or cough.  He has gained 4 pounds.  He has been using [employer] in his G-tube as instructed.  He denies any diarrhea.  No change in urinary habits.  No new neuropathy reported. Rest of the pertinent 10 point ROS reviewed and negative.  Patient Active Problem List   Diagnosis Date Noted   Encounter for antineoplastic immunotherapy 10/11/2021   Pneumonitis 09/23/2021   Hypomagnesemia 08/05/2021   Dysphagia 07/24/2021   Neuropathy due to chemotherapeutic drug ([place]) 07/04/2021   Weight loss, unintentional 06/20/2021   Protein-calorie malnutrition, severe 04/17/2021   Perforated gastric ulcer ([place])    Gastrostomy tube dysfunction ([place]) 04/12/2021   Anemia due to antineoplastic chemotherapy 03/26/2021   Cancer related pain 03/26/2021   Laryngeal edema 03/11/2021   Nausea and vomiting 01/24/2021   Shortness of breath 01/10/2021   Mucositis due to  antineoplastic therapy 01/10/2021   G tube feedings ([place]) 01/10/2021   Chemotherapy induced diarrhea 10/30/2020   Port-A-Cath in place 10/23/2020   Chronic hepatitis C without hepatic coma ([place]) 06/09/2019   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  MEDICAL HISTORY: Past Medical History:  Diagnosis Date   Cancer ([place])    Throat cancer 2019   ETOH abuse    Frequent urination    Glaucoma    Hepatitis C virus infection without hepatic coma    dx'ed in 11/2018   History of radiation therapy 05/12/19- 07/06/19   Larynx   Hypertension    Wears denture    upper only; lost lower denture    SURGICAL HISTORY: Past Surgical History:  Procedure Laterality Date   ANKLE SURGERY  2011   right ankle   COLONOSCOPY  02/2019   polyps - Dr Havery Moros   DIRECT LARYNGOSCOPY N/A 04/01/2019   Procedure: DIRECT LARYNGOSCOPY WITH BIOPSY;  Surgeon: Jodi Marble, Alan;  Location: [facility] OR;  Service: ENT;  Laterality: N/A;   DIRECT LARYNGOSCOPY N/A 01/06/2020   Procedure: DIRECT LARYNGOSCOPY;  Surgeon: Izora Gala, Alan;  Location: [place];  Service: ENT;  Laterality: N/A;   DIRECT LARYNGOSCOPY N/A 08/13/2020   Procedure: DIRECT LARYNGOSCOPY;  Surgeon: Izora Gala, Alan;  Location: [place];  Service: ENT;  Laterality: N/A;   ESOPHAGOSCOPY N/A 04/01/2019   Procedure: ESOPHAGOSCOPY;  Surgeon: Jodi Marble, Alan;  Location: [place];  Service: ENT;  Laterality: N/A;   EXCISION ORAL TUMOR Right 08/13/2020   Procedure: BIOPSY OF OROPHARYNGEAL MASS;  Surgeon: Izora Gala, Alan;  Location: [place];  Service: ENT;  Laterality: Right;   EYE SURGERY Right    IR CM INJ ANY COLONIC TUBE W/FLUORO  12/20/2020   IR CM INJ ANY COLONIC TUBE W/FLUORO  03/20/2021   IR GASTROSTOMY TUBE MOD SED  09/24/2020   IR IMAGING GUIDED PORT INSERTION  09/24/2020   IR RADIOLOGIST EVAL & MGMT  07/02/2021   KNEE SURGERY     LAPAROTOMY N/A 04/12/2021    Procedure: EXPLORATORY LAPAROTOMY, REPAIR OF DUODENAL ULCER WITH Silvestre Gunner;  Surgeon: Felicie Morn, Alan;  Location: [facility];  Service: General;  Laterality: N/A;   LARYNGOSCOPY AND BRONCHOSCOPY N/A 04/01/2019   Procedure: BRONCHOSCOPY;  Surgeon: Jodi Marble, Alan;  Location: [place];  Service: ENT;  Laterality: N/A;   RADICAL NECK DISSECTION N/A 01/06/2020   Procedure: RADICAL NECK DISSECTION;  Surgeon: Izora Gala, Alan;  Location: [facility] OR;  Service: ENT;  Laterality: N/A;    SOCIAL HISTORY: Social History   Socioeconomic History   Marital status: Single    Spouse name: Not on file   Number of children: 2   Years of education: Not on file   Highest education level: Not on file  Occupational History   Not on file  Tobacco  Use   Smoking status: Former    Years: 50.00    Types: Cigarettes    Quit date: 06/01/2020    Years since quitting: 1.3   Smokeless tobacco: Never  Vaping Use   Vaping Use: Never used  Substance and Sexual Activity   Alcohol use: Yes    Alcohol/week: 4.0 standard drinks    Types: 4 Cans of beer per week    Comment: 40oz beer daily   Drug use: Yes    Types: Cocaine    Comment: none in 2 yrs   Sexual activity: Yes    Partners: Female  Other Topics Concern   Not on file  Social History Narrative   Patient is divorced with 2 children.   Patient is currently living with his sister.   Patient with a history of smoking a third of pack of cigarettes daily for 50 years.  Patient currently smoking 2 to 3 cigarettes/day.   Patient has never used smokeless tobacco.   Patient with occasional use of alcohol.   Patient last used cocaine approximately 6 months ago.  Patient denies use of marijuana.   Social Determinants of Health   Financial Resource Strain: Not on file  Food Insecurity: Not on file  Transportation Needs: Not on file  Physical Activity: Not on file  Stress: Not on file  Social Connections: Not on file  Intimate Partner Violence: Not on file     FAMILY HISTORY: Family History  Problem Relation Age of Onset   Breast cancer Sister    Colon cancer Brother 32       ????   Cancer Brother     Review of Systems  Constitutional:  Positive for fatigue (Mild). Negative for appetite change, chills, fever and unexpected weight change.  HENT:   Positive for sore throat. Negative for hearing loss, lump/mass, mouth sores and trouble swallowing.   Eyes:  Negative for eye problems and icterus.  Respiratory:  Negative for chest tightness, cough and shortness of breath.   Cardiovascular:  Negative for chest pain, leg swelling and palpitations.  Gastrointestinal:  Negative for abdominal distention, abdominal pain, constipation, diarrhea, nausea and vomiting.  Endocrine: Negative for hot flashes.  Genitourinary:  Negative for difficulty urinating.   Musculoskeletal:  Negative for arthralgias.  Skin:  Negative for itching and rash.  Neurological:  Negative for dizziness, extremity weakness, headaches and numbness.  Hematological:  Negative for adenopathy. Does not bruise/bleed easily.  Psychiatric/Behavioral:  Negative for depression. The patient is not nervous/anxious.      PHYSICAL EXAMINATION  ECOG PERFORMANCE STATUS: 2 - Symptomatic, <50% confined to bed  Vitals:   10/11/21 1314  BP: 117/75  Pulse: (!) 108  Resp: 18  Temp: 97.7 F (36.5 C)  SpO2: 100%   Physical Exam Constitutional:      General: He is not in acute distress.    Appearance: Normal appearance. He is not toxic-appearing.  HENT:     Head: Normocephalic and atraumatic.  Eyes:     General: No scleral icterus. Cardiovascular:     Rate and Rhythm: Normal rate and regular rhythm.     Pulses: Normal pulses.     Heart sounds: Normal heart sounds.  Pulmonary:     Effort: Pulmonary effort is normal.     Breath sounds: Normal breath sounds.     Comments: No adventitious sounds noted Abdominal:     General: Abdomen is flat. Bowel sounds are normal. There is no  distension.  Palpations: Abdomen is soft.     Tenderness: There is no abdominal tenderness.  Musculoskeletal:        General: No swelling.     Cervical back: Neck supple.  Lymphadenopathy:     Cervical: No cervical adenopathy.  Skin:    General: Skin is warm and dry.     Findings: No rash.  Neurological:     General: No focal deficit present.     Mental Status: He is alert.  Psychiatric:        Mood and Affect: Mood normal.        Behavior: Behavior normal.    LABORATORY DATA:  CBC    Component Value Date/Time   WBC 4.6 10/11/2021 1255   RBC 3.35 (L) 10/11/2021 1255   HGB 11.3 (L) 10/11/2021 1255   HGB 10.4 (L) 07/04/2021 1025   HCT 33.4 (L) 10/11/2021 1255   PLT 203 10/11/2021 1255   PLT 305 07/04/2021 1025   MCV 99.7 10/11/2021 1255   MCH 33.7 10/11/2021 1255   MCHC 33.8 10/11/2021 1255   RDW 14.0 10/11/2021 1255   LYMPHSABS 0.8 10/11/2021 1255   MONOABS 0.6 10/11/2021 1255   EOSABS 0.1 10/11/2021 1255   BASOSABS 0.1 10/11/2021 1255    CMP     Component Value Date/Time   NA 135 10/11/2021 1255   K 4.3 10/11/2021 1255   CL 101 10/11/2021 1255   CO2 28 10/11/2021 1255   GLUCOSE 145 (H) 10/11/2021 1255   BUN 19 10/11/2021 1255   CREATININE 0.54 (L) 10/11/2021 1255   CREATININE 0.59 (L) 07/04/2021 1025   CREATININE 0.61 (L) 10/26/2019 1523   CALCIUM 9.1 10/11/2021 1255   PROT 7.1 10/11/2021 1255   ALBUMIN 3.9 10/11/2021 1255   AST 15 10/11/2021 1255   AST 10 (L) 07/04/2021 1025   ALT 9 10/11/2021 1255   ALT 7 07/04/2021 1025   ALT 53 (H) 09/06/2019 1543   ALKPHOS 39 10/11/2021 1255   BILITOT 0.5 10/11/2021 1255   BILITOT 0.2 (L) 07/04/2021 1025   GFRNONAA >60 10/11/2021 1255   GFRNONAA >60 07/04/2021 1025   GFRAA >60 01/03/2020 1355   GFRAA >60 05/04/2019 1207     ASSESSMENT and THERAPY PLAN:   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 Neels is a patient with complicated history of supraglottic tumor with cancer recurrence  in the head and neck region currently on weekly CarboTaxol and every 21-day Keytruda.   After his last imaging, we were concerned about possible pneumonitis from immunotherapy versus recovering COVID-19 infection.  He possibly had grade 1 versus grade 2 pneumonitis from immunotherapy however we could not rule out atypical pneumonia hence he was treated with a short course of antibiotics and steroids.  He improved clinically and had a repeat CT imaging which is yet to be officially read however appears to have improved compared to last CT on my read.  I have tried to call radiology for an official read, awaiting report. At this time we will proceed with CarboTaxol as planned and hold off on Keytruda since we are awaiting radiological confirmation of improvement.  He understands that if we rechallenge him with [facility], he can have recurrence of pneumonitis which can occasionally become life-threatening and fatal.  We will plan to proceed with immunotherapy next week.  Cancer related pain Continue oxycodone for pain management.  No change in pain needs.  Prescription has been refilled today  Weight loss, unintentional Since last visit, he has  gained 4 pounds of weight which is phenomenal.  Encouraged him to continue G-tube feeding.  He is not supposed to take anything by mouth based on speech therapy evaluation.  Pneumonitis Pneumonitis, grade 1 most likely. It is unclear if this could be atypical infection versus immunotherapy related or recovering COVID-19 infection.  However we have treated him empirically for possible atypical pneumonia or mild pneumonitis related to immunotherapy.  No concerns today for worsening pneumonitis.  Repeat CT appears to be improving.  His current symptoms have resolved, hence we can attempt rechallenge of immunotherapy. He understands symptoms can recur and occasionally become severe and life threatening upon rechallenge.   All questions were answered. The patient  knows to call the clinic with any problems, questions or concerns. We can certainly see the patient much sooner if necessary.  Total encounter time: 45 minutes in face-to-face visit time, chart review, lab review, care coordination, and documentation of the encounter.  *Total Encounter Time as defined by the Centers for Medicare and Medicaid Services includes, in addition to the face-to-face time of a patient visit (documented in the note above) non-face-to-face time: obtaining and reviewing outside history, ordering and reviewing medications, tests or procedures, care coordination (communications with other health care professionals or caregivers) and documentation in the medical record.

## 2021-10-11 NOTE — Patient Instructions (Signed)
[facility]  Discharge Instructions: Thank you for choosing [facility] to provide your oncology and hematology care.   If you have a lab appointment with the Cancer Center, please go directly to the Cancer Center and check in at the registration area.   Wear comfortable clothing and clothing appropriate for easy access to any Portacath or PICC line.   We strive to give you quality time with your provider. You may need to reschedule your appointment if you arrive late (15 or more minutes).  Arriving late affects you and other patients whose appointments are after yours.  Also, if you miss three or more appointments without notifying the office, you may be dismissed from the clinic at the provider's discretion.      For prescription refill requests, have your pharmacy contact our office and allow 72 hours for refills to be completed.    Today you received the following chemotherapy and/or immunotherapy agents : Paclitaxel,  Carboplatin.   To help prevent nausea and vomiting after your treatment, we encourage you to take your nausea medication as directed.  BELOW ARE SYMPTOMS THAT SHOULD BE REPORTED IMMEDIATELY: *FEVER GREATER THAN 100.4 F (38 C) OR HIGHER *CHILLS OR SWEATING *NAUSEA AND VOMITING THAT IS NOT CONTROLLED WITH YOUR NAUSEA MEDICATION *UNUSUAL SHORTNESS OF BREATH *UNUSUAL BRUISING OR BLEEDING *URINARY PROBLEMS (pain or burning when urinating, or frequent urination) *BOWEL PROBLEMS (unusual diarrhea, constipation, pain near the anus) TENDERNESS IN MOUTH AND THROAT WITH OR WITHOUT PRESENCE OF ULCERS (sore throat, sores in mouth, or a toothache) UNUSUAL RASH, SWELLING OR PAIN  UNUSUAL VAGINAL DISCHARGE OR ITCHING   Items with * indicate a potential emergency and should be followed up as soon as possible or go to the Emergency Department if any problems should occur.  Please show the CHEMOTHERAPY ALERT CARD or IMMUNOTHERAPY ALERT CARD  at check-in to the Emergency Department and triage nurse.  Should you have questions after your visit or need to cancel or reschedule your appointment, please contact [facility]  Dept: [phone]  and follow the prompts.  Office hours are 8:00 a.m. to 4:30 p.m. Monday - Friday. Please note that voicemails left after 4:00 p.m. may not be returned until the following business day.  We are closed weekends and major holidays. You have access to a nurse at all times for urgent questions. Please call the main number to the clinic Dept: [phone] and follow the prompts.   For any non-urgent questions, you may also contact your provider using MyChart. We now offer e-Visits for anyone 18 and older to request care online for non-urgent symptoms. For details visit [email or web address].   Also download the MyChart app! Go to the app store, search "MyChart", open the app, select [facility], and log in with your MyChart username and password.  Due to Covid, a mask is required upon entering the hospital/clinic. If you do not have a mask, one will be given to you upon arrival. For doctor visits, patients may have 1 support person aged 18 or older with them. For treatment visits, patients cannot have anyone with them due to current Covid guidelines and our immunocompromised population.   

## 2021-10-11 NOTE — Addendum Note (Signed)
Addended by: Adaline Sill on: 10/11/2021 05:01 PM   Modules accepted: Orders

## 2021-10-11 NOTE — Assessment & Plan Note (Addendum)
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   After his last imaging, we were concerned about possible pneumonitis from immunotherapy versus recovering COVID-19 infection.  He possibly had grade 1 versus grade 2 pneumonitis from immunotherapy however we could not rule out atypical pneumonia hence he was treated with a short course of antibiotics and steroids.  He improved clinically and had a repeat CT imaging which is yet to be officially read however appears to have improved compared to last CT on my read.  I have tried to call radiology for an official read, awaiting report. At this time we will proceed with CarboTaxol as planned and hold off on Keytruda since we are awaiting radiological confirmation of improvement.  He understands that if we rechallenge him with [facility], he can have recurrence of pneumonitis which can occasionally become life-threatening and fatal.  We will plan to proceed with immunotherapy next week.

## 2021-10-18 ENCOUNTER — Ambulatory Visit: Payer: Medicare HMO | Admitting: Dietician

## 2021-10-18 ENCOUNTER — Encounter: Payer: Self-pay | Admitting: Adult Health

## 2021-10-18 ENCOUNTER — Other Ambulatory Visit: Payer: Self-pay | Admitting: Hematology and Oncology

## 2021-10-18 ENCOUNTER — Inpatient Hospital Stay: Payer: Medicare HMO

## 2021-10-18 ENCOUNTER — Other Ambulatory Visit: Payer: Self-pay

## 2021-10-18 ENCOUNTER — Inpatient Hospital Stay (HOSPITAL_BASED_OUTPATIENT_CLINIC_OR_DEPARTMENT_OTHER): Payer: Medicare HMO | Admitting: Adult Health

## 2021-10-18 ENCOUNTER — Inpatient Hospital Stay: Payer: Medicare HMO | Attending: Hematology and Oncology

## 2021-10-18 VITALS — BP 160/95 | HR 97 | Temp 98.5°F | Resp 18 | Ht 67.0 in | Wt 111.8 lb

## 2021-10-18 DIAGNOSIS — C321 Malignant neoplasm of supraglottis: Secondary | ICD-10-CM | POA: Diagnosis present

## 2021-10-18 DIAGNOSIS — Z95828 Presence of other vascular implants and grafts: Secondary | ICD-10-CM

## 2021-10-18 DIAGNOSIS — Z5111 Encounter for antineoplastic chemotherapy: Secondary | ICD-10-CM | POA: Insufficient documentation

## 2021-10-18 DIAGNOSIS — Z79899 Other long term (current) drug therapy: Secondary | ICD-10-CM | POA: Insufficient documentation

## 2021-10-18 DIAGNOSIS — E871 Hypo-osmolality and hyponatremia: Secondary | ICD-10-CM | POA: Diagnosis not present

## 2021-10-18 LAB — COMPREHENSIVE METABOLIC PANEL
ALT: 10 U/L (ref 0–44)
AST: 15 U/L (ref 15–41)
Albumin: 4 g/dL (ref 3.5–5.0)
Alkaline Phosphatase: 38 U/L (ref 38–126)
Anion gap: 9 (ref 5–15)
BUN: 17 mg/dL (ref 8–23)
CO2: 28 mmol/L (ref 22–32)
Calcium: 9.1 mg/dL (ref 8.9–10.3)
Chloride: 92 mmol/L — ABNORMAL LOW (ref 98–111)
Creatinine, Ser: 0.45 mg/dL — ABNORMAL LOW (ref 0.61–1.24)
GFR, Estimated: >60 mL/min (ref >60)
Glucose, Bld: 75 mg/dL (ref 70–99)
Potassium: 3.5 mmol/L (ref 3.5–5.1)
Sodium: 129 mmol/L — ABNORMAL LOW (ref 135–145)
Total Bilirubin: 0.5 mg/dL (ref 0.3–1.2)
Total Protein: 7.1 g/dL (ref 6.5–8.1)

## 2021-10-18 LAB — CBC WITH DIFFERENTIAL/PLATELET
Abs Immature Granulocytes: 0.02 10*3/uL (ref 0.00–0.07)
Basophils Absolute: 0 10*3/uL (ref 0.0–0.1)
Basophils Relative: 1 %
Eosinophils Absolute: 0.1 10*3/uL (ref 0.0–0.5)
Eosinophils Relative: 3 %
HCT: 30.5 % — ABNORMAL LOW (ref 39.0–52.0)
Hemoglobin: 10.1 g/dL — ABNORMAL LOW (ref 13.0–17.0)
Immature Granulocytes: 1 %
Lymphocytes Relative: 24 %
Lymphs Abs: 0.9 10*3/uL (ref 0.7–4.0)
MCH: 32.6 pg (ref 26.0–34.0)
MCHC: 33.1 g/dL (ref 30.0–36.0)
MCV: 98.4 fL (ref 80.0–100.0)
Monocytes Absolute: 0.4 10*3/uL (ref 0.1–1.0)
Monocytes Relative: 10 %
Neutro Abs: 2.3 10*3/uL (ref 1.7–7.7)
Neutrophils Relative %: 61 %
Platelets: 203 10*3/uL (ref 150–400)
RBC: 3.1 MIL/uL — ABNORMAL LOW (ref 4.22–5.81)
RDW: 13.2 % (ref 11.5–15.5)
WBC: 3.8 10*3/uL — ABNORMAL LOW (ref 4.0–10.5)
nRBC: 0 % (ref 0.0–0.2)

## 2021-10-18 LAB — TSH: TSH: 0.932 u[IU]/mL (ref 0.320–4.118)

## 2021-10-18 MED ORDER — SODIUM CHLORIDE 0.9 % IV SOLN
200.0000 mg | Freq: Once | INTRAVENOUS | Status: AC
  Administered 2021-10-18: 200 mg via INTRAVENOUS
  Filled 2021-10-18: qty 8

## 2021-10-18 MED ORDER — FAMOTIDINE IN NACL 20-0.9 MG/50ML-% IV SOLN
20.0000 mg | Freq: Once | INTRAVENOUS | Status: AC
  Administered 2021-10-18: 20 mg via INTRAVENOUS
  Filled 2021-10-18: qty 50

## 2021-10-18 MED ORDER — SODIUM CHLORIDE 0.9 % IV SOLN
10.0000 mg | Freq: Once | INTRAVENOUS | Status: AC
  Administered 2021-10-18: 10 mg via INTRAVENOUS
  Filled 2021-10-18: qty 10

## 2021-10-18 MED ORDER — DIPHENHYDRAMINE HCL 50 MG/ML IJ SOLN
50.0000 mg | Freq: Once | INTRAMUSCULAR | Status: AC
  Administered 2021-10-18: 50 mg via INTRAVENOUS
  Filled 2021-10-18: qty 1

## 2021-10-18 MED ORDER — SODIUM CHLORIDE 0.9 % IV SOLN: Freq: Once | INTRAVENOUS | Status: DC

## 2021-10-18 MED ORDER — SODIUM CHLORIDE 0.9% FLUSH
10.0000 mL | Freq: Once | INTRAVENOUS | Status: AC
  Administered 2021-10-18: 10 mL

## 2021-10-18 MED ORDER — SODIUM CHLORIDE 0.9 % IV SOLN
110.0000 mg | Freq: Once | INTRAVENOUS | Status: AC
  Administered 2021-10-18: 110 mg via INTRAVENOUS
  Filled 2021-10-18: qty 11

## 2021-10-18 MED ORDER — SODIUM CHLORIDE 0.9 % IV SOLN: Freq: Once | INTRAVENOUS | Status: AC

## 2021-10-18 MED ORDER — SODIUM CHLORIDE 0.9 % IV SOLN
50.0000 mg/m2 | Freq: Once | INTRAVENOUS | Status: AC
  Administered 2021-10-18: 78 mg via INTRAVENOUS
  Filled 2021-10-18: qty 13

## 2021-10-18 MED ORDER — SODIUM CHLORIDE 0.9 % IV SOLN: 50.0000 mg/m2 | Freq: Once | INTRAVENOUS | Status: DC

## 2021-10-18 MED ORDER — HEPARIN SOD (PORK) LOCK FLUSH 100 UNIT/ML IV SOLN
500.0000 [IU] | Freq: Once | INTRAVENOUS | Status: AC | PRN
  Administered 2021-10-18: 500 [IU]

## 2021-10-18 MED ORDER — PALONOSETRON HCL INJECTION 0.25 MG/5ML
0.2500 mg | Freq: Once | INTRAVENOUS | Status: AC
  Administered 2021-10-18: 0.25 mg via INTRAVENOUS
  Filled 2021-10-18: qty 5

## 2021-10-18 MED ORDER — SODIUM CHLORIDE 0.9% FLUSH
10.0000 mL | INTRAVENOUS | Status: DC | PRN
  Administered 2021-10-18: 10 mL

## 2021-10-18 NOTE — Progress Notes (Signed)
Received message from NP with request to evaluate tube feeding regimen. Patient with hyponatremia (Na 129) today. Patient reported he has been consistently giving 4 cartons of [facility] 1.4/day as well as 3 (16 oz) bottles of water via tube in the last week. Previously patient endorses giving 3-4 cartons of [employer] and 2 (16 oz) bottles of water.   RD has been encouraging patient to increase tube feedings to goal (4 cartons/day) to promote weight gain. Met with patient in office on 1/20 to clarify regimen and amount of daily water flush. Patient instructed to give one carton (325 ml) [employer] 1.4 - 4x/day. Flush tube with 60 ml water before and after each feeding. Give additional 2 cups water via tube daily. Written instructions were provided.   This regimen provides 1820 kcal, 80 grams protein, 936 ml free water from formula (1890 ml total water with flushes)  Met with patient during infusion. He reports feeling fatigued. Patient pleased to share he had gained some weight. Patient reports he is unsure why he increased water, "thought I needed to since I'm doing more cartons now" Patient unsure of what he did with written instructions. Additional copy provided to him today. Will continue to monitor labs closely and make adjustments as needed.

## 2021-10-18 NOTE — Progress Notes (Signed)
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 SEE NOTE   04/01/2019 Surgery   Pre-Op Dx:   right supraglottic carcinoma   Post-op Dx: T2 N0 M0 (stage II) right supraglottic carcinoma   Proc: Direct laryngoscopy with biopsy.  Cervical esophagoscopy.  Bronchoscopy.   Surg:  [name]    Findings: A bulky necrotic and semi-pedunculated tumor coming off the lateral surface of the right aryepiglottic fold and the anterior and lateral aspect of the piriform sinus on the right side.  Airway was compromised by the tumor mass at intubation.   04/11/2019 Imaging   CT neck: IMPRESSION: The glottis is closed with suboptimal evaluation of the vocal cords. No asymmetry of the cords. Correlate with laryngoscopy results.   No enlarged lymph nodes in the neck. 7 mm right level 2 lymph node may be reactive. No definite pathologic lymph nodes in the neck.   04/11/2019 Imaging   CT neck  This CT was performed after surgical debulking of an exophytic laryngeal tumor.   Most apparent on series 2, image 51 there is abnormal asymmetric  hyperenhancement along the surface of the right piriform sinus, and perhaps also involving the bilateral AE folds.   04/22/2019 Imaging   PET: IMPRESSION: 1. Mild residual activity in the posterior RIGHT hypopharynx confined to the mucosa. 2. No evidence of hypermetabolic metastatic lymph nodes in LEFT or RIGHT neck. 3. No evidence distant metastatic disease.   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 New FDG avid right level 2 cervical lymph node concerning for recurrent disease. No findings of distant metastatic disease within the chest, abdomen or pelvis. 2. Mild nonspecific increased uptake in the right side of larynx is similar to previous exam. 3. Aortic Atherosclerosis (ICD10-I70.0). Coronary artery calcifications.    11/21/2019 Pathology Results   SURGICAL PATHOLOGY  CASE: [id number]  PATIENT: Khyle Sauser  Surgical Pathology Report   FINAL MICROSCOPIC DIAGNOSIS:   A. LYMPH NODE, RIGHT CERVICAL:  - Poorly differentiated carcinoma consistent with metastatic squamous cell carcinoma.  - See comment   01/06/2020 Pathology Results   SURGICAL PATHOLOGY  CASE: [id number]  PATIENT: [place]  Surgical Pathology Report  Clinical History: metastatic cancer to cervical lymph nodes (cm)   FINAL MICROSCOPIC DIAGNOSIS:   A. LYMPH NODE, RIGHT MODIFIED, DISSECTION:  - Metastatic squamous cell carcinoma in four of nineteen lymph nodes (4/19).  - Largest metastatic nodule is 3.1 cm with 5 mm  extra-nodal extension.  - Lymphovascular space involvement in peri-nodal connective tissue.  - See comment.    01/06/2020 Surgery   PRE-OPERATIVE DIAGNOSIS:  METASTATIC CANCER TO CERVICAL LYMPH NODES   POST-OPERATIVE DIAGNOSIS:  METASTATIC CANCER TO CERVICAL LYMPH NODES   PROCEDURE:  Procedure(s): DIRECT LARYNGOSCOPY MODIFIED RADICAL NECK DISSECTION   SURGEON:  [name], MD     SPECIMEN: Right modified neck dissection including levels 2 and 3, including the internal jugular vein, suture marks the inferior jugular vein stump.   07/05/2020 Imaging   1. Sequelae of radiation and right neck dissection from the previous contrasted Neck CT.   2. Asymmetric soft tissue thickening and enhancement along the right lateral pharynx. Although this might be asymmetric mucositis, the appearance is suspicious and recommend direct inspection. NI-RADS category 2a.   3. Suspicious small 6 mm hyperenhancing nodular soft tissue along the posterior margin of the neck dissection at right level 3. NI-RADS category 2 vs 3 - although this is likely too small for imaging guided biopsy. Repeat PET-CT may be most valuable.   4. Mild inflammatory appearing right upper lobe centrilobular ground-glass opacity, new since February. Consider mild or developing right upper lobe infection. Post radiation changes to the lung apices.       08/13/2020 Pathology Results   A. OROPHARNYX, BIOPSY:  - Poorly differentiated squamous cell carcinoma.  - See comment.    08/13/2020 Surgery   POST-OPERATIVE DIAGNOSIS:  Tonsillary Mass History of Laryngeal  Cancer   PROCEDURE:  Procedure(s): DIRECT LARYNGOSCOPY BIOPSY OF OROPHARYNGEAL MASS   08/30/2020 PET scan   1. Recurrence of head neck carcinoma with a broad hypermetabolic pharyngeal mucosal lesion involving the RIGHT lateral posterior oropharynx and hypopharynx extending from the palatine tonsil to the vallecula. 2. Hypermetabolic lymph node posterior  sternocleidomastoid muscle on the RIGHT (level 3). 3. Hypermetabolic nodule within the LEFT parotid glands favored small primary parotid neoplasm. 4. No evidence thoracic metastasis   09/24/2020 - 10/30/2020 Chemotherapy   He received weekly cisplatin   01/31/2021 PET scan   Findings of worsening nodal disease with a node or group of nodes in the LEFT neck and a RIGHT supraclavicular lymph node as discussed.   Post treatment changes with soft tissue fullness in the area of the prior tumor but with markedly diminished metabolic activity.     03/11/2021 Imaging   Ct neck  Diffuse pharyngeal and laryngeal mucosal and submucosal edema probably related to acute radiation injury. The airway is narrow and could possibly be critically narrowed. I do not see any evidence of recurrent mucosal or submucosal tumor identifiable.   Similar appearance of the recurrent malignant lymphadenopathy on the left at level 2 and level 3 and on the right in the low supraclavicular region just above and behind the clavicular head.   04/12/2021 Imaging   Ct abdomen and pelvis  1. Contrast material in the peritoneal cavity of the upper abdomen suggesting leak along the gastrostomy tract. 2. Gastrostomy tube is appropriately positioned. No evidence of abscess. 3. 1.3 cm right middle lobe nodule, new since 01/31/2021. Favor infectious/inflammatory over primary/secondary neoplasm given the relatively short time frame of development.   04/12/2021 Surgery   Postoperative Diagnosis: Perforated duodenal ulcer (1st  portion)   Surgical Procedure:  Modified Graham patch of duodenal ulcer Gastrostomy tube exchange   Operative Team Members:  Surgeon(s) and Role:    * Stechschulte, Nickola Major, MD - Primary     Drains:  (19 Fr) Jackson-Pratt drain(s) with closed bulb suction in the right upper quadrant near the duodenal ulcer repair and Gastrostomy Tube 26 fr   05/09/2021 Imaging   1. Progression of lesion in the right tonsil  with central fluid collection and peripheral enhancement. This was a site of prior recurrent sick could represent progressive tumor or abscess. Continued follow-up recommended. 2. Progressive malignant lymphadenopathy in the left neck. Stable lymphadenopathy right supraclavicular region. 3. New area of stranding in the superior segment left lower lobe but certain etiology. Attention on follow-up recommended.     06/13/2021 -  Chemotherapy   Patient is on Treatment Plan :  Paclitaxel + Carboplatin q7d     06/13/2021 -  Chemotherapy   Patient is on Treatment Plan : HEAD/NECK Pembrolizumab Q21D     07/18/2021 Imaging   Ct chest 1. No highly specific findings identified to suggest pulmonary or nodal metastasis within the chest 2. Multifocal patchy areas of ground-glass and nodular airspace densities. Surrounding postinflammatory changes are noted with thickening of the peribronchovascular interstitium. Imaging findings are concerning for sequelae of aspiration and/or multifocal infection. Follow-up imaging after appropriate antibiotic therapy is recommended with repeat CT of the chest in 3 months to ensure resolution and to rule out the possibility of underlying metastatic nodule. 3. Coronary artery calcifications. 4. Aortic Atherosclerosis (ICD10-I70.0) and Emphysema (ICD10-J43.9).   07/18/2021 Imaging   CT neck  1. Positive treatment response with decreased size of nodal metastases in the left jugular chain. There is likely residual active tumor in the treated nodes. 2. Ulcerated lesions in the bilateral palatine fossa, progressed on the left and suspicious for synchronous tumors.   07/23/2021 Cancer Staging   Staging form: Larynx - Supraglottis, AJCC 8th Edition - Clinical stage from 07/23/2021: Stage IVA (rcT2, cN2b, cM0) - Signed by [name], MD on 07/23/2021 Stage prefix: Recurrence    09/19/2021 Imaging   IMPRESSION: 1. Positive treatment response at the cervical lymph nodes.  No residual nodal disease identified currently. 2. Persisting bilateral palatine tonsil lesion with mild size increase on the right.     09/19/2021 Imaging   IMPRESSION: No new significant lymphadenopathy seen in the mediastinum.   There is interval increase in infiltrates at multiple sites in both lungs as described in the body of the report. There is interval clearing of small patchy infiltrates in right lower lobe. Findings suggest possible waxing and waning multifocal pneumonia.   There are few subcentimeter nodules as described in the body of the report which may be part of pneumonia or neoplastic process. Short-term follow-up CT in 3 months may be considered. Coronary artery calcifications are seen. There is ectasia of ascending thoracic aorta.     CURRENT THERAPY: taxol/carbo/keytruda  INTERVAL HISTORY: SHAWAN CORELLA 71 y.o. male returns for evaluation prior to receiving Taxol [place] and [place].  Of note his Beryle Flock was held last week due to questionable history of pneumonitis and pending CT scan.  He is doing well today.  He denies any new pain.  He has no shortness of breath.  His sodium today is 129.  He tells me that he is taking in 1 carton of [employer] tube feed 4 times a day in addition to three 16 ounce free water tube flushes.  He says he is slightly fatigued but otherwise doing well.  He is up 4 pounds today.   Patient Active Problem List   Diagnosis Date Noted   Encounter for antineoplastic immunotherapy 10/11/2021   Pneumonitis 09/23/2021   Hypomagnesemia 08/05/2021   Dysphagia 07/24/2021   Neuropathy due to chemotherapeutic drug ([place]) 07/04/2021   Weight loss, unintentional 06/20/2021   Protein-calorie malnutrition, severe 04/17/2021   Perforated gastric ulcer ([place])    Gastrostomy tube dysfunction (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  MEDICAL HISTORY: Past Medical History:  Diagnosis Date   Cancer ([place])    Throat cancer 2019   ETOH abuse    Frequent urination    Glaucoma    Hepatitis C virus infection without hepatic coma    dx'ed in 11/2018   History of radiation therapy 05/12/19- 07/06/19   Larynx   Hypertension    Wears denture    upper only; lost lower denture    SURGICAL HISTORY: Past Surgical History:  Procedure Laterality Date   ANKLE SURGERY  2011   right ankle   COLONOSCOPY  02/2019   polyps - Dr Havery Moros   DIRECT LARYNGOSCOPY N/A 04/01/2019   Procedure: DIRECT LARYNGOSCOPY WITH BIOPSY;  Surgeon: Jodi Marble, MD;  Location: [facility] OR;  Service: ENT;  Laterality: N/A;   DIRECT LARYNGOSCOPY N/A 01/06/2020   Procedure: DIRECT LARYNGOSCOPY;  Surgeon: Izora Gala, MD;  Location: [facility];  Service: ENT;  Laterality: N/A;   DIRECT LARYNGOSCOPY N/A 08/13/2020   Procedure: DIRECT LARYNGOSCOPY;  Surgeon: Izora Gala, MD;  Location: [facility];  Service: ENT;  Laterality: N/A;   ESOPHAGOSCOPY N/A 04/01/2019   Procedure: ESOPHAGOSCOPY;  Surgeon: Jodi Marble, MD;  Location: [facility];  Service: ENT;  Laterality: N/A;   EXCISION ORAL TUMOR Right 08/13/2020   Procedure: BIOPSY OF OROPHARYNGEAL MASS;  Surgeon: Izora Gala, MD;  Location: [facility];  Service: ENT;  Laterality: Right;   EYE SURGERY Right    IR CM INJ ANY COLONIC TUBE W/FLUORO  12/20/2020   IR CM INJ ANY COLONIC TUBE W/FLUORO  03/20/2021   IR GASTROSTOMY TUBE MOD SED  09/24/2020   IR IMAGING GUIDED PORT INSERTION  09/24/2020   IR RADIOLOGIST EVAL & MGMT   07/02/2021   KNEE SURGERY     LAPAROTOMY N/A 04/12/2021   Procedure: EXPLORATORY LAPAROTOMY, REPAIR OF DUODENAL ULCER WITH Silvestre Gunner;  Surgeon: Felicie Morn, MD;  Location: [facility];  Service: General;  Laterality: N/A;   LARYNGOSCOPY AND BRONCHOSCOPY N/A 04/01/2019   Procedure: BRONCHOSCOPY;  Surgeon: Jodi Marble, MD;  Location: [facility];  Service: ENT;  Laterality: N/A;   RADICAL NECK DISSECTION N/A 01/06/2020   Procedure: RADICAL NECK DISSECTION;  Surgeon: Izora Gala, MD;  Location: [facility];  Service: ENT;  Laterality: N/A;    SOCIAL HISTORY: Social History   Socioeconomic History   Marital status: Single    Spouse name: Not on file   Number of children: 2   Years of education: Not on  file   Highest education level: Not on file  Occupational History   Not on file  Tobacco Use   Smoking status: Former    Years: 50.00    Types: Cigarettes    Quit date: 06/01/2020    Years since quitting: 1.3   Smokeless tobacco: Never  Vaping Use   Vaping Use: Never used  Substance and Sexual Activity   Alcohol use: Yes    Alcohol/week: 4.0 standard drinks    Types: 4 Cans of beer per week    Comment: 40oz beer daily   Drug use: Yes    Types: Cocaine    Comment: none in 2 yrs   Sexual activity: Yes    Partners: Female  Other Topics Concern   Not on file  Social History Narrative   Patient is divorced with 2 children.   Patient is currently living with his sister.   Patient with a history of smoking a third of pack of cigarettes daily for 50 years.  Patient currently smoking 2 to 3 cigarettes/day.   Patient has never used smokeless tobacco.   Patient with occasional use of alcohol.   Patient last used cocaine approximately 6 months ago.  Patient denies use of marijuana.   Social Determinants of Health   Financial Resource Strain: Not on file  Food Insecurity: Not on file  Transportation Needs: Not on file  Physical Activity: Not on file  Stress: Not on file  Social  Connections: Not on file  Intimate Partner Violence: Not on file    FAMILY HISTORY: Family History  Problem Relation Age of Onset   Breast cancer Sister    Colon cancer Brother 67       ????   Cancer Brother     Review of Systems  Constitutional:  Positive for fatigue. Negative for appetite change, chills, fever and unexpected weight change.  HENT:   Negative for hearing loss, lump/mass and trouble swallowing.   Eyes:  Negative for eye problems and icterus.  Respiratory:  Negative for chest tightness, cough and shortness of breath.   Cardiovascular:  Negative for chest pain, leg swelling and palpitations.  Gastrointestinal:  Negative for abdominal distention, abdominal pain, constipation, diarrhea, nausea and vomiting.  Endocrine: Negative for hot flashes.  Genitourinary:  Negative for difficulty urinating.   Musculoskeletal:  Negative for arthralgias.  Skin:  Negative for itching and rash.  Neurological:  Negative for dizziness, extremity weakness, headaches and numbness.  Hematological:  Negative for adenopathy. Does not bruise/bleed easily.  Psychiatric/Behavioral:  Negative for depression. The patient is not nervous/anxious.      PHYSICAL EXAMINATION  ECOG PERFORMANCE STATUS: 1 - Symptomatic but completely ambulatory  See [facility] for vitals  Physical Exam Constitutional:      General: He is not in acute distress.    Appearance: Normal appearance. He is not toxic-appearing.  HENT:     Head: Normocephalic and atraumatic.  Eyes:     General: No scleral icterus. Cardiovascular:     Rate and Rhythm: Normal rate and regular rhythm.     Pulses: Normal pulses.     Heart sounds: Normal heart sounds.  Pulmonary:     Effort: Pulmonary effort is normal.     Breath sounds: Normal breath sounds.  Abdominal:     General: Abdomen is flat. Bowel sounds are normal. There is no distension.     Palpations: Abdomen is soft.     Tenderness: There is no abdominal tenderness.   Musculoskeletal:  General: No swelling.     Cervical back: Neck supple.  Lymphadenopathy:     Cervical: No cervical adenopathy.  Skin:    General: Skin is warm and dry.     Capillary Refill: Capillary refill takes less than 2 seconds.     Findings: No rash.  Neurological:     General: No focal deficit present.     Mental Status: He is alert.  Psychiatric:        Mood and Affect: Mood normal.        Behavior: Behavior normal.    LABORATORY DATA:  CBC    Component Value Date/Time   WBC 3.8 (L) 10/18/2021 1134   RBC 3.10 (L) 10/18/2021 1134   HGB 10.1 (L) 10/18/2021 1134   HGB 10.4 (L) 07/04/2021 1025   HCT 30.5 (L) 10/18/2021 1134   PLT 203 10/18/2021 1134   PLT 305 07/04/2021 1025   MCV 98.4 10/18/2021 1134   MCH 32.6 10/18/2021 1134   MCHC 33.1 10/18/2021 1134   RDW 13.2 10/18/2021 1134   LYMPHSABS 0.9 10/18/2021 1134   MONOABS 0.4 10/18/2021 1134   EOSABS 0.1 10/18/2021 1134   BASOSABS 0.0 10/18/2021 1134    CMP     Component Value Date/Time   NA 129 (L) 10/18/2021 1134   K 3.5 10/18/2021 1134   CL 92 (L) 10/18/2021 1134   CO2 28 10/18/2021 1134   GLUCOSE 75 10/18/2021 1134   BUN 17 10/18/2021 1134   CREATININE 0.45 (L) 10/18/2021 1134   CREATININE 0.59 (L) 07/04/2021 1025   CREATININE 0.61 (L) 10/26/2019 1523   CALCIUM 9.1 10/18/2021 1134   PROT 7.1 10/18/2021 1134   ALBUMIN 4.0 10/18/2021 1134   AST 15 10/18/2021 1134   AST 10 (L) 07/04/2021 1025   ALT 10 10/18/2021 1134   ALT 7 07/04/2021 1025   ALT 53 (H) 09/06/2019 1543   ALKPHOS 38 10/18/2021 1134   BILITOT 0.5 10/18/2021 1134   BILITOT 0.2 (L) 07/04/2021 1025   GFRNONAA >60 10/18/2021 1134   GFRNONAA >60 07/04/2021 1025   GFRAA >60 01/03/2020 1355   GFRAA >60 05/04/2019 1207       ASSESSMENT and THERAPY PLAN:   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  1.  Head neck cancer: He will  receive treatment today with Taxol [place] and [place].  He is tolerating this well.  He is no signs of pneumonitis on his lung exam today.  2.  Hyponatremia: We will give him some IV fluids since his chloride is also low.  I reviewed this with Dr. Chryl Heck and she is aware.  I also messaged our nutritionist who will go and see him today and evaluate whether or not his tube feeds or free water intake should be adjusted.  I recommended continued activity so that same he can maintain his strength.  His weight is up 4 pounds which is great.  He will return in 1 week for labs and treatment and in 2 weeks for labs, follow-up with Dr. Chryl Heck, treatment.   All questions were answered. The patient knows to call the clinic with any problems, questions or concerns. We can certainly see the patient much sooner if necessary.  Total encounter time: 20 minutes in face-to-face visit time, chart review, lab review, care coordination, discussion with nutrition, discussion with Dr. Chryl Heck, and documentation of the encounter.  Wilber Bihari, NP 10/18/21 1:43  PM Medical Oncology and Hematology [facility] [place], [place] [address] Tel. [phone]    Fax. [phone]  *Total Encounter Time as defined by the Centers for Medicare and Medicaid Services includes, in addition to the face-to-face time of a patient visit (documented in the note above) non-face-to-face time: obtaining and reviewing outside history, ordering and reviewing medications, tests or procedures, care coordination (communications with other health care professionals or caregivers) and documentation in the medical record.

## 2021-10-18 NOTE — Progress Notes (Signed)
Ok to proceed with Cycle 5 Keytruda today per MD.  Acquanetta Belling, RPH, BCPS, BCOP 10/18/2021 1:03 PM

## 2021-10-18 NOTE — Assessment & Plan Note (Signed)
[name] is here today for follow-up and treatment of his head and neck cancer.  1.  Head neck cancer: He will receive treatment today with Taxol [place] and [place].  He is tolerating this well.  He is no signs of pneumonitis on his lung exam today.  2.  Hyponatremia: We will give him some IV fluids since his chloride is also low.  I reviewed this with Dr. Chryl Heck and she is aware.  I also messaged our nutritionist who will go and see him today and evaluate whether or not his tube feeds or free water intake should be adjusted.  I recommended continued activity so that same he can maintain his strength.  His weight is up 4 pounds which is great.  He will return in 1 week for labs and treatment and in 2 weeks for labs, follow-up with Dr. Chryl Heck, treatment.

## 2021-10-18 NOTE — Patient Instructions (Signed)
[facility]  Discharge Instructions: Thank you for choosing [name] to provide your oncology and hematology care.   If you have a lab appointment with the [facility], please go directly to the [facility] and check in at the registration area.   Wear comfortable clothing and clothing appropriate for easy access to any Portacath or PICC line.   We strive to give you quality time with your provider. You may need to reschedule your appointment if you arrive late (15 or more minutes).  Arriving late affects you and other patients whose appointments are after yours.  Also, if you miss three or more appointments without notifying the office, you may be dismissed from the clinic at the provider's discretion.      For prescription refill requests, have your pharmacy contact our office and allow 72 hours for refills to be completed.    Today you received the following chemotherapy and/or immunotherapy agents : Paclitaxel,  Carboplatin.   To help prevent nausea and vomiting after your treatment, we encourage you to take your nausea medication as directed.  BELOW ARE SYMPTOMS THAT SHOULD BE REPORTED IMMEDIATELY: *FEVER GREATER THAN 100.4 F (38 C) OR HIGHER *CHILLS OR SWEATING *NAUSEA AND VOMITING THAT IS NOT CONTROLLED WITH YOUR NAUSEA MEDICATION *UNUSUAL SHORTNESS OF BREATH *UNUSUAL BRUISING OR BLEEDING *URINARY PROBLEMS (pain or burning when urinating, or frequent urination) *BOWEL PROBLEMS (unusual diarrhea, constipation, pain near the anus) TENDERNESS IN MOUTH AND THROAT WITH OR WITHOUT PRESENCE OF ULCERS (sore throat, sores in mouth, or a toothache) UNUSUAL RASH, SWELLING OR PAIN  UNUSUAL VAGINAL DISCHARGE OR ITCHING   Items with * indicate a potential emergency and should be followed up as soon as possible or go to the Emergency Department if any problems should occur.  Please show the CHEMOTHERAPY ALERT CARD or IMMUNOTHERAPY ALERT CARD  at check-in to the Emergency Department and triage nurse.  Should you have questions after your visit or need to cancel or reschedule your appointment, please contact [facility]  Dept: [phone]  and follow the prompts.  Office hours are 8:00 a.m. to 4:30 p.m. Monday - Friday. Please note that voicemails left after 4:00 p.m. may not be returned until the following business day.  We are closed weekends and major holidays. You have access to a nurse at all times for urgent questions. Please call the main number to the clinic Dept: [phone] and follow the prompts.   For any non-urgent questions, you may also contact your provider using MyChart. We now offer e-Visits for anyone 18 and older to request care online for non-urgent symptoms. For details visit [email or web address].   Also download the MyChart app! Go to the app store, search "MyChart", open the app, select [facility], and log in with your MyChart username and password.  Due to Covid, a mask is required upon entering the hospital/clinic. If you do not have a mask, one will be given to you upon arrival. For doctor visits, patients may have 1 support person aged 18 or older with them. For treatment visits, patients cannot have anyone with them due to current Covid guidelines and our immunocompromised population.   

## 2021-10-25 ENCOUNTER — Inpatient Hospital Stay: Payer: Medicare HMO

## 2021-10-25 ENCOUNTER — Other Ambulatory Visit: Payer: Self-pay

## 2021-10-25 ENCOUNTER — Other Ambulatory Visit: Payer: Self-pay | Admitting: Hematology and Oncology

## 2021-10-25 VITALS — BP 134/95 | HR 92 | Temp 98.8°F | Resp 18 | Wt 104.8 lb

## 2021-10-25 DIAGNOSIS — C321 Malignant neoplasm of supraglottis: Secondary | ICD-10-CM

## 2021-10-25 DIAGNOSIS — Z95828 Presence of other vascular implants and grafts: Secondary | ICD-10-CM

## 2021-10-25 DIAGNOSIS — Z5111 Encounter for antineoplastic chemotherapy: Secondary | ICD-10-CM | POA: Diagnosis not present

## 2021-10-25 LAB — CBC WITH DIFFERENTIAL/PLATELET
Abs Immature Granulocytes: 0.01 10*3/uL (ref 0.00–0.07)
Basophils Absolute: 0 10*3/uL (ref 0.0–0.1)
Basophils Relative: 1 %
Eosinophils Absolute: 0.1 10*3/uL (ref 0.0–0.5)
Eosinophils Relative: 2 %
HCT: 33 % — ABNORMAL LOW (ref 39.0–52.0)
Hemoglobin: 10.9 g/dL — ABNORMAL LOW (ref 13.0–17.0)
Immature Granulocytes: 0 %
Lymphocytes Relative: 29 %
Lymphs Abs: 0.8 10*3/uL (ref 0.7–4.0)
MCH: 32.3 pg (ref 26.0–34.0)
MCHC: 33 g/dL (ref 30.0–36.0)
MCV: 97.9 fL (ref 80.0–100.0)
Monocytes Absolute: 0.3 10*3/uL (ref 0.1–1.0)
Monocytes Relative: 12 %
Neutro Abs: 1.5 10*3/uL — ABNORMAL LOW (ref 1.7–7.7)
Neutrophils Relative %: 56 %
Platelets: 205 10*3/uL (ref 150–400)
RBC: 3.37 MIL/uL — ABNORMAL LOW (ref 4.22–5.81)
RDW: 13 % (ref 11.5–15.5)
WBC: 2.8 10*3/uL — ABNORMAL LOW (ref 4.0–10.5)
nRBC: 0 % (ref 0.0–0.2)

## 2021-10-25 LAB — COMPREHENSIVE METABOLIC PANEL
ALT: 10 U/L (ref 0–44)
AST: 14 U/L — ABNORMAL LOW (ref 15–41)
Albumin: 4.1 g/dL (ref 3.5–5.0)
Alkaline Phosphatase: 42 U/L (ref 38–126)
Anion gap: 6 (ref 5–15)
BUN: 13 mg/dL (ref 8–23)
CO2: 31 mmol/L (ref 22–32)
Calcium: 9.4 mg/dL (ref 8.9–10.3)
Chloride: 98 mmol/L (ref 98–111)
Creatinine, Ser: 0.51 mg/dL — ABNORMAL LOW (ref 0.61–1.24)
GFR, Estimated: >60 mL/min (ref >60)
Glucose, Bld: 156 mg/dL — ABNORMAL HIGH (ref 70–99)
Potassium: 4.1 mmol/L (ref 3.5–5.1)
Sodium: 135 mmol/L (ref 135–145)
Total Bilirubin: 0.4 mg/dL (ref 0.3–1.2)
Total Protein: 7.5 g/dL (ref 6.5–8.1)

## 2021-10-25 MED ORDER — SODIUM CHLORIDE 0.9 % IV SOLN: Freq: Once | INTRAVENOUS | Status: AC

## 2021-10-25 MED ORDER — PALONOSETRON HCL INJECTION 0.25 MG/5ML
0.2500 mg | Freq: Once | INTRAVENOUS | Status: AC
  Administered 2021-10-25: 0.25 mg via INTRAVENOUS
  Filled 2021-10-25: qty 5

## 2021-10-25 MED ORDER — FAMOTIDINE IN NACL 20-0.9 MG/50ML-% IV SOLN
20.0000 mg | Freq: Once | INTRAVENOUS | Status: AC
  Administered 2021-10-25: 20 mg via INTRAVENOUS
  Filled 2021-10-25: qty 50

## 2021-10-25 MED ORDER — SODIUM CHLORIDE 0.9 % IV SOLN
10.0000 mg | Freq: Once | INTRAVENOUS | Status: AC
  Administered 2021-10-25: 10 mg via INTRAVENOUS
  Filled 2021-10-25: qty 10

## 2021-10-25 MED ORDER — SODIUM CHLORIDE 0.9% FLUSH
10.0000 mL | Freq: Once | INTRAVENOUS | Status: AC
  Administered 2021-10-25: 10 mL

## 2021-10-25 MED ORDER — HEPARIN SOD (PORK) LOCK FLUSH 100 UNIT/ML IV SOLN
500.0000 [IU] | Freq: Once | INTRAVENOUS | Status: AC | PRN
  Administered 2021-10-25: 500 [IU]

## 2021-10-25 MED ORDER — DIPHENHYDRAMINE HCL 50 MG/ML IJ SOLN
50.0000 mg | Freq: Once | INTRAMUSCULAR | Status: AC
  Administered 2021-10-25: 50 mg via INTRAVENOUS
  Filled 2021-10-25: qty 1

## 2021-10-25 MED ORDER — SODIUM CHLORIDE 0.9 % IV SOLN
50.0000 mg/m2 | Freq: Once | INTRAVENOUS | Status: AC
  Administered 2021-10-25: 78 mg via INTRAVENOUS
  Filled 2021-10-25: qty 13

## 2021-10-25 MED ORDER — SODIUM CHLORIDE 0.9% FLUSH
10.0000 mL | INTRAVENOUS | Status: DC | PRN
  Administered 2021-10-25: 10 mL

## 2021-10-25 MED ORDER — SODIUM CHLORIDE 0.9 % IV SOLN
111.0000 mg | Freq: Once | INTRAVENOUS | Status: AC
  Administered 2021-10-25: 110 mg via INTRAVENOUS
  Filled 2021-10-25: qty 11

## 2021-10-25 NOTE — Patient Instructions (Signed)
[facility]  Discharge Instructions: Thank you for choosing [facility] to provide your oncology and hematology care.   If you have a lab appointment with the Cancer Center, please go directly to the Cancer Center and check in at the registration area.   Wear comfortable clothing and clothing appropriate for easy access to any Portacath or PICC line.   We strive to give you quality time with your provider. You may need to reschedule your appointment if you arrive late (15 or more minutes).  Arriving late affects you and other patients whose appointments are after yours.  Also, if you miss three or more appointments without notifying the office, you may be dismissed from the clinic at the provider's discretion.      For prescription refill requests, have your pharmacy contact our office and allow 72 hours for refills to be completed.    Today you received the following chemotherapy and/or immunotherapy agents : Paclitaxel,  Carboplatin.   To help prevent nausea and vomiting after your treatment, we encourage you to take your nausea medication as directed.  BELOW ARE SYMPTOMS THAT SHOULD BE REPORTED IMMEDIATELY: *FEVER GREATER THAN 100.4 F (38 C) OR HIGHER *CHILLS OR SWEATING *NAUSEA AND VOMITING THAT IS NOT CONTROLLED WITH YOUR NAUSEA MEDICATION *UNUSUAL SHORTNESS OF BREATH *UNUSUAL BRUISING OR BLEEDING *URINARY PROBLEMS (pain or burning when urinating, or frequent urination) *BOWEL PROBLEMS (unusual diarrhea, constipation, pain near the anus) TENDERNESS IN MOUTH AND THROAT WITH OR WITHOUT PRESENCE OF ULCERS (sore throat, sores in mouth, or a toothache) UNUSUAL RASH, SWELLING OR PAIN  UNUSUAL VAGINAL DISCHARGE OR ITCHING   Items with * indicate a potential emergency and should be followed up as soon as possible or go to the Emergency Department if any problems should occur.  Please show the CHEMOTHERAPY ALERT CARD or IMMUNOTHERAPY ALERT CARD  at check-in to the Emergency Department and triage nurse.  Should you have questions after your visit or need to cancel or reschedule your appointment, please contact [facility]  Dept: [phone]  and follow the prompts.  Office hours are 8:00 a.m. to 4:30 p.m. Monday - Friday. Please note that voicemails left after 4:00 p.m. may not be returned until the following business day.  We are closed weekends and major holidays. You have access to a nurse at all times for urgent questions. Please call the main number to the clinic Dept: [phone] and follow the prompts.   For any non-urgent questions, you may also contact your provider using MyChart. We now offer e-Visits for anyone 18 and older to request care online for non-urgent symptoms. For details visit [email or web address].   Also download the MyChart app! Go to the app store, search "MyChart", open the app, select [facility], and log in with your MyChart username and password.  Due to Covid, a mask is required upon entering the hospital/clinic. If you do not have a mask, one will be given to you upon arrival. For doctor visits, patients may have 1 support person aged 18 or older with them. For treatment visits, patients cannot have anyone with them due to current Covid guidelines and our immunocompromised population.   

## 2021-10-30 ENCOUNTER — Telehealth: Payer: Self-pay

## 2021-10-30 NOTE — Telephone Encounter (Signed)
Pt called requesting refill on oxyCODONE. This has been routed to MD to refill if appropriate. Pt verbalized thanks and understanding.

## 2021-10-31 ENCOUNTER — Encounter: Payer: Self-pay | Admitting: Hematology and Oncology

## 2021-11-01 ENCOUNTER — Other Ambulatory Visit: Payer: Self-pay

## 2021-11-01 ENCOUNTER — Inpatient Hospital Stay (HOSPITAL_BASED_OUTPATIENT_CLINIC_OR_DEPARTMENT_OTHER): Payer: Medicare HMO | Admitting: Hematology and Oncology

## 2021-11-01 ENCOUNTER — Inpatient Hospital Stay: Payer: Medicare HMO

## 2021-11-01 ENCOUNTER — Encounter: Payer: Self-pay | Admitting: Hematology and Oncology

## 2021-11-01 VITALS — BP 125/77 | HR 99 | Temp 98.1°F | Resp 16 | Ht 67.0 in | Wt 108.5 lb

## 2021-11-01 DIAGNOSIS — Z95828 Presence of other vascular implants and grafts: Secondary | ICD-10-CM

## 2021-11-01 DIAGNOSIS — C321 Malignant neoplasm of supraglottis: Secondary | ICD-10-CM

## 2021-11-01 DIAGNOSIS — Z5111 Encounter for antineoplastic chemotherapy: Secondary | ICD-10-CM | POA: Diagnosis not present

## 2021-11-01 DIAGNOSIS — Z931 Gastrostomy status: Secondary | ICD-10-CM | POA: Diagnosis not present

## 2021-11-01 DIAGNOSIS — G893 Neoplasm related pain (acute) (chronic): Secondary | ICD-10-CM

## 2021-11-01 LAB — CMP (CANCER CENTER ONLY)
ALT: 9 U/L (ref 0–44)
AST: 15 U/L (ref 15–41)
Albumin: 4 g/dL (ref 3.5–5.0)
Alkaline Phosphatase: 38 U/L (ref 38–126)
Anion gap: 5 (ref 5–15)
BUN: 18 mg/dL (ref 8–23)
CO2: 29 mmol/L (ref 22–32)
Calcium: 9.1 mg/dL (ref 8.9–10.3)
Chloride: 100 mmol/L (ref 98–111)
Creatinine: 0.54 mg/dL — ABNORMAL LOW (ref 0.61–1.24)
GFR, Estimated: >60 mL/min (ref >60)
Glucose, Bld: 141 mg/dL — ABNORMAL HIGH (ref 70–99)
Potassium: 4.1 mmol/L (ref 3.5–5.1)
Sodium: 134 mmol/L — ABNORMAL LOW (ref 135–145)
Total Bilirubin: 0.4 mg/dL (ref 0.3–1.2)
Total Protein: 6.9 g/dL (ref 6.5–8.1)

## 2021-11-01 LAB — CBC WITH DIFFERENTIAL (CANCER CENTER ONLY)
Abs Immature Granulocytes: 0.04 10*3/uL (ref 0.00–0.07)
Basophils Absolute: 0 10*3/uL (ref 0.0–0.1)
Basophils Relative: 1 %
Eosinophils Absolute: 0.1 10*3/uL (ref 0.0–0.5)
Eosinophils Relative: 1 %
HCT: 30.8 % — ABNORMAL LOW (ref 39.0–52.0)
Hemoglobin: 10.2 g/dL — ABNORMAL LOW (ref 13.0–17.0)
Immature Granulocytes: 1 %
Lymphocytes Relative: 20 %
Lymphs Abs: 0.8 10*3/uL (ref 0.7–4.0)
MCH: 32.8 pg (ref 26.0–34.0)
MCHC: 33.1 g/dL (ref 30.0–36.0)
MCV: 99 fL (ref 80.0–100.0)
Monocytes Absolute: 0.5 10*3/uL (ref 0.1–1.0)
Monocytes Relative: 11 %
Neutro Abs: 2.7 10*3/uL (ref 1.7–7.7)
Neutrophils Relative %: 66 %
Platelet Count: 222 10*3/uL (ref 150–400)
RBC: 3.11 MIL/uL — ABNORMAL LOW (ref 4.22–5.81)
RDW: 13.3 % (ref 11.5–15.5)
WBC Count: 4 10*3/uL (ref 4.0–10.5)
nRBC: 0 % (ref 0.0–0.2)

## 2021-11-01 LAB — TSH: TSH: 1.272 u[IU]/mL (ref 0.320–4.118)

## 2021-11-01 MED ORDER — SODIUM CHLORIDE 0.9% FLUSH
10.0000 mL | INTRAVENOUS | Status: DC | PRN
  Administered 2021-11-01: 10 mL

## 2021-11-01 MED ORDER — SODIUM CHLORIDE 0.9 % IV SOLN: 200.0000 mg | Freq: Once | INTRAVENOUS | Status: DC

## 2021-11-01 MED ORDER — OXYCODONE HCL 5 MG PO TABS: 5.0000 mg | ORAL_TABLET | Freq: Four times a day (QID) | ORAL | 0 refills | Status: DC | PRN

## 2021-11-01 MED ORDER — SODIUM CHLORIDE 0.9% FLUSH
10.0000 mL | Freq: Once | INTRAVENOUS | Status: AC
  Administered 2021-11-01: 10 mL

## 2021-11-01 MED ORDER — HEPARIN SOD (PORK) LOCK FLUSH 100 UNIT/ML IV SOLN
500.0000 [IU] | Freq: Once | INTRAVENOUS | Status: AC | PRN
  Administered 2021-11-01: 500 [IU]

## 2021-11-01 MED ORDER — SODIUM CHLORIDE 0.9 % IV SOLN: Freq: Once | INTRAVENOUS | Status: AC

## 2021-11-01 MED ORDER — PALONOSETRON HCL INJECTION 0.25 MG/5ML
0.2500 mg | Freq: Once | INTRAVENOUS | Status: AC
  Administered 2021-11-01: 0.25 mg via INTRAVENOUS
  Filled 2021-11-01: qty 5

## 2021-11-01 MED ORDER — SODIUM CHLORIDE 0.9 % IV SOLN
111.0000 mg | Freq: Once | INTRAVENOUS | Status: AC
  Administered 2021-11-01: 110 mg via INTRAVENOUS
  Filled 2021-11-01: qty 11

## 2021-11-01 MED ORDER — FAMOTIDINE IN NACL 20-0.9 MG/50ML-% IV SOLN
20.0000 mg | Freq: Once | INTRAVENOUS | Status: AC
  Administered 2021-11-01: 20 mg via INTRAVENOUS
  Filled 2021-11-01: qty 50

## 2021-11-01 MED ORDER — SODIUM CHLORIDE 0.9 % IV SOLN
50.0000 mg/m2 | Freq: Once | INTRAVENOUS | Status: AC
  Administered 2021-11-01: 78 mg via INTRAVENOUS
  Filled 2021-11-01: qty 13

## 2021-11-01 MED ORDER — SODIUM CHLORIDE 0.9 % IV SOLN
10.0000 mg | Freq: Once | INTRAVENOUS | Status: AC
  Administered 2021-11-01: 10 mg via INTRAVENOUS
  Filled 2021-11-01: qty 10

## 2021-11-01 MED ORDER — DIPHENHYDRAMINE HCL 50 MG/ML IJ SOLN
50.0000 mg | Freq: Once | INTRAMUSCULAR | Status: AC
  Administered 2021-11-01: 50 mg via INTRAVENOUS
  Filled 2021-11-01: qty 1

## 2021-11-01 NOTE — Assessment & Plan Note (Signed)
Patient complains of pain around the G-tube.  He is able to tolerate [facility] as instructed, weight has been stable.  He cannot eat because of risk of aspiration. I have examined the G-tube, there is no evidence of infection, there is granulation tissue growing around the G-tube, bowel sounds are normal.  No evidence of acute abdomen. We will get this G-tube evaluated by radiology to see if he needs to be resized.

## 2021-11-01 NOTE — Assessment & Plan Note (Signed)
Mr. 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   We had to hold immunotherapy for cycle 8 because of concern of pneumonitis from immunotherapy versus recovering COVID-19 infection.  However since it was thought to be grade 1, we gave him a challenge of short course of steroids as well as antibiotics for possible atypical pneumonia and repeat imaging showed recovering pneumonitis hence we have discussed about proceeding with immunotherapy. He currently is on weekly CarboTaxol and every 21-day Keytruda.  After today's cycle, we will proceed with repeat imaging to assess the response again.  He can proceed with week 12 of CarboTaxol next week.  If he happens to have stable disease, at that point I might just continue immunotherapy and monitor him.  He denies any major toxicities from treatment so far however he is not a very good historian either.   No evidence of pneumonitis or acute abdomen on my exam today. Okay to proceed with chemo today if labs are within parameters.

## 2021-11-01 NOTE — Patient Instructions (Signed)
[facility]  Discharge Instructions: Thank you for choosing [facility] to provide your oncology and hematology care.   If you have a lab appointment with the Cancer Center, please go directly to the Cancer Center and check in at the registration area.   Wear comfortable clothing and clothing appropriate for easy access to any Portacath or PICC line.   We strive to give you quality time with your provider. You may need to reschedule your appointment if you arrive late (15 or more minutes).  Arriving late affects you and other patients whose appointments are after yours.  Also, if you miss three or more appointments without notifying the office, you may be dismissed from the clinic at the provider's discretion.      For prescription refill requests, have your pharmacy contact our office and allow 72 hours for refills to be completed.    Today you received the following chemotherapy and/or immunotherapy agents: Taxol & Carboplatin   To help prevent nausea and vomiting after your treatment, we encourage you to take your nausea medication as directed.  BELOW ARE SYMPTOMS THAT SHOULD BE REPORTED IMMEDIATELY: *FEVER GREATER THAN 100.4 F (38 C) OR HIGHER *CHILLS OR SWEATING *NAUSEA AND VOMITING THAT IS NOT CONTROLLED WITH YOUR NAUSEA MEDICATION *UNUSUAL SHORTNESS OF BREATH *UNUSUAL BRUISING OR BLEEDING *URINARY PROBLEMS (pain or burning when urinating, or frequent urination) *BOWEL PROBLEMS (unusual diarrhea, constipation, pain near the anus) TENDERNESS IN MOUTH AND THROAT WITH OR WITHOUT PRESENCE OF ULCERS (sore throat, sores in mouth, or a toothache) UNUSUAL RASH, SWELLING OR PAIN  UNUSUAL VAGINAL DISCHARGE OR ITCHING   Items with * indicate a potential emergency and should be followed up as soon as possible or go to the Emergency Department if any problems should occur.  Please show the CHEMOTHERAPY ALERT CARD or IMMUNOTHERAPY ALERT CARD at  check-in to the Emergency Department and triage nurse.  Should you have questions after your visit or need to cancel or reschedule your appointment, please contact [facility]  Dept: [phone]  and follow the prompts.  Office hours are 8:00 a.m. to 4:30 p.m. Monday - Friday. Please note that voicemails left after 4:00 p.m. may not be returned until the following business day.  We are closed weekends and major holidays. You have access to a nurse at all times for urgent questions. Please call the main number to the clinic Dept: [phone] and follow the prompts.   For any non-urgent questions, you may also contact your provider using MyChart. We now offer e-Visits for anyone 18 and older to request care online for non-urgent symptoms. For details visit [email or web address].   Also download the MyChart app! Go to the app store, search "MyChart", open the app, select , and log in with your MyChart username and password.  Due to Covid, a mask is required upon entering the hospital/clinic. If you do not have a mask, one will be given to you upon arrival. For doctor visits, patients may have 1 support person aged 18 or older with them. For treatment visits, patients cannot have anyone with them due to current Covid guidelines and our immunocompromised population.   

## 2021-11-01 NOTE — Progress Notes (Signed)
Oncology Nurse Navigator Documentation   Dr. Chryl Heck requested that I visit patient in infusion today and assess his PEG site. She told me that he reported pain and she observed granulation tissue to his PEG site today. I went to infusion, removed his PEG dressing and observed red tissue between his stoma and retention ring of his PEG which her reported as being painful when touched with a Q-tip. I contacted IR and spoke with Alan Bihari PA  and he verbalized that based on my description this was granulation tissue common in patients who have PEG's long term and told me that it is not an emergent situation but they could see him next week to possibly cauterize the area. I spoke with [name] and he was in agreement to be seen next week for possible cauterization of the tissue. He does report that the PEG is flushing well with water and nutrition supplements. I have also called his caregiver, [name] and informed her of the above. I have arranged transportation for next week as well. [name] and [name] know to call me if there are any further questions or concerns.   Alan Asa RN, BSN, [name] Phone # [phone]  Fax # [phone]

## 2021-11-01 NOTE — Progress Notes (Signed)
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 SEE NOTE   04/01/2019 Surgery   Pre-Op Dx:   right supraglottic carcinoma   Post-op Dx: T2 N0 M0 (stage II) right supraglottic carcinoma   Proc: Direct laryngoscopy with biopsy.  Cervical esophagoscopy.  Bronchoscopy.   Surg:  [name]    Findings: A bulky necrotic and semi-pedunculated tumor coming off the lateral surface of the right aryepiglottic fold and the anterior and lateral aspect of the piriform sinus on the right side.  Airway was compromised by the tumor mass at intubation.   04/11/2019 Imaging   CT neck: IMPRESSION: The glottis is closed with suboptimal evaluation of the vocal cords. No asymmetry of the cords. Correlate with laryngoscopy results.   No enlarged lymph nodes in the neck. 7 mm right level 2 lymph node may be reactive. No definite pathologic lymph nodes in the neck.   04/11/2019 Imaging   CT neck  This CT was performed after surgical debulking of an exophytic laryngeal tumor.   Most apparent on series 2, image 51 there is abnormal asymmetric  hyperenhancement along the surface of the right piriform sinus, and perhaps also involving the bilateral AE folds.   04/22/2019 Imaging   PET: IMPRESSION: 1. Mild residual activity in the posterior RIGHT hypopharynx confined to the mucosa. 2. No evidence of hypermetabolic metastatic lymph nodes in LEFT or RIGHT neck. 3. No evidence distant metastatic disease.   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 New FDG avid right level 2 cervical lymph node concerning for recurrent disease. No findings of distant metastatic disease within the chest, abdomen or pelvis. 2. Mild nonspecific increased uptake in the right side of larynx is similar to previous exam. 3. Aortic Atherosclerosis (ICD10-I70.0). Coronary artery calcifications.    11/21/2019 Pathology Results   SURGICAL PATHOLOGY  CASE: [id number]  [name]  Surgical Pathology Report   FINAL MICROSCOPIC DIAGNOSIS:   A. LYMPH NODE, RIGHT CERVICAL:  - Poorly differentiated carcinoma consistent with metastatic squamous cell carcinoma.  - See comment   01/06/2020 Pathology Results   SURGICAL PATHOLOGY  CASE: [id number]  [name]  Surgical Pathology Report  Clinical History: metastatic cancer to cervical lymph nodes (cm)   FINAL MICROSCOPIC DIAGNOSIS:   A. LYMPH NODE, RIGHT MODIFIED, DISSECTION:  - Metastatic squamous cell carcinoma in four of nineteen lymph nodes (4/19).  - Largest metastatic nodule is 3.1 cm with 5 mm  extra-nodal extension.  - Lymphovascular space involvement in peri-nodal connective tissue.  - See comment.    01/06/2020 Surgery   PRE-OPERATIVE DIAGNOSIS:  METASTATIC CANCER TO CERVICAL LYMPH NODES   POST-OPERATIVE DIAGNOSIS:  METASTATIC CANCER TO CERVICAL LYMPH NODES   PROCEDURE:  Procedure(s): DIRECT LARYNGOSCOPY MODIFIED RADICAL NECK DISSECTION   SURGEON:  [name], MD     SPECIMEN: Right modified neck dissection including levels 2 and 3, including the internal jugular vein, suture marks the inferior jugular vein stump.   07/05/2020 Imaging   1. Sequelae of radiation and right neck dissection from the previous contrasted Neck CT.   2. Asymmetric soft tissue thickening and enhancement along the right lateral pharynx. Although this might be asymmetric mucositis, the appearance is suspicious and recommend direct inspection. NI-RADS category 2a.   3. Suspicious small 6 mm hyperenhancing nodular soft tissue along the posterior margin of the neck dissection at right level 3. NI-RADS category 2 vs 3 - although this is likely too small for imaging guided biopsy. Repeat PET-CT may be most valuable.   4. Mild inflammatory appearing right upper lobe centrilobular ground-glass opacity, new since February. Consider mild or developing right upper lobe infection. Post radiation changes to the lung apices.       08/13/2020 Pathology Results   A. OROPHARNYX, BIOPSY:  - Poorly differentiated squamous cell carcinoma.  - See comment.    08/13/2020 Surgery   POST-OPERATIVE DIAGNOSIS:  Tonsillary Mass History of Laryngeal  Cancer   PROCEDURE:  Procedure(s): DIRECT LARYNGOSCOPY BIOPSY OF OROPHARYNGEAL MASS   08/30/2020 PET scan   1. Recurrence of head neck carcinoma with a broad hypermetabolic pharyngeal mucosal lesion involving the RIGHT lateral posterior oropharynx and hypopharynx extending from the palatine tonsil to the vallecula. 2. Hypermetabolic lymph node posterior  sternocleidomastoid muscle on the RIGHT (level 3). 3. Hypermetabolic nodule within the LEFT parotid glands favored small primary parotid neoplasm. 4. No evidence thoracic metastasis   09/24/2020 - 10/30/2020 Chemotherapy   He received weekly cisplatin   01/31/2021 PET scan   Findings of worsening nodal disease with a node or group of nodes in the LEFT neck and a RIGHT supraclavicular lymph node as discussed.   Post treatment changes with soft tissue fullness in the area of the prior tumor but with markedly diminished metabolic activity.     03/11/2021 Imaging   Ct neck  Diffuse pharyngeal and laryngeal mucosal and submucosal edema probably related to acute radiation injury. The airway is narrow and could possibly be critically narrowed. I do not see any evidence of recurrent mucosal or submucosal tumor identifiable.   Similar appearance of the recurrent malignant lymphadenopathy on the left at level 2 and level 3 and on the right in the low supraclavicular region just above and behind the clavicular head.   04/12/2021 Imaging   Ct abdomen and pelvis  1. Contrast material in the peritoneal cavity of the upper abdomen suggesting leak along the gastrostomy tract. 2. Gastrostomy tube is appropriately positioned. No evidence of abscess. 3. 1.3 cm right middle lobe nodule, new since 01/31/2021. Favor infectious/inflammatory over primary/secondary neoplasm given the relatively short time frame of development.   04/12/2021 Surgery   Postoperative Diagnosis: Perforated duodenal ulcer (1st  portion)   Surgical Procedure:  Modified Graham patch of duodenal ulcer Gastrostomy tube exchange   Operative Team Members:  Surgeon(s) and Role:    * Stechschulte, Nickola Major, MD - Primary     Drains:  (19 Fr) Jackson-Pratt drain(s) with closed bulb suction in the right upper quadrant near the duodenal ulcer repair and Gastrostomy Tube 26 fr   05/09/2021 Imaging   1. Progression of lesion in the right tonsil  with central fluid collection and peripheral enhancement. This was a site of prior recurrent sick could represent progressive tumor or abscess. Continued follow-up recommended. 2. Progressive malignant lymphadenopathy in the left neck. Stable lymphadenopathy right supraclavicular region. 3. New area of stranding in the superior segment left lower lobe but certain etiology. Attention on follow-up recommended.     06/13/2021 -  Chemotherapy   Patient is on Treatment Plan :  Paclitaxel + Carboplatin q7d     06/13/2021 -  Chemotherapy   Patient is on Treatment Plan : HEAD/NECK Pembrolizumab Q21D     07/18/2021 Imaging   Ct chest 1. No highly specific findings identified to suggest pulmonary or nodal metastasis within the chest 2. Multifocal patchy areas of ground-glass and nodular airspace densities. Surrounding postinflammatory changes are noted with thickening of the peribronchovascular interstitium. Imaging findings are concerning for sequelae of aspiration and/or multifocal infection. Follow-up imaging after appropriate antibiotic therapy is recommended with repeat CT of the chest in 3 months to ensure resolution and to rule out the possibility of underlying metastatic nodule. 3. Coronary artery calcifications. 4. Aortic Atherosclerosis (ICD10-I70.0) and Emphysema (ICD10-J43.9).   07/18/2021 Imaging   CT neck  1. Positive treatment response with decreased size of nodal metastases in the left jugular chain. There is likely residual active tumor in the treated nodes. 2. Ulcerated lesions in the bilateral palatine fossa, progressed on the left and suspicious for synchronous tumors.   07/23/2021 Cancer Staging   Staging form: Larynx - Supraglottis, AJCC 8th Edition - Clinical stage from 07/23/2021: Stage IVA (rcT2, cN2b, cM0) - Signed by [name], MD on 07/23/2021 Stage prefix: Recurrence    09/19/2021 Imaging   IMPRESSION: 1. Positive treatment response at the cervical lymph nodes.  No residual nodal disease identified currently. 2. Persisting bilateral palatine tonsil lesion with mild size increase on the right.     09/19/2021 Imaging   IMPRESSION: No new significant lymphadenopathy seen in the mediastinum.   There is interval increase in infiltrates at multiple sites in both lungs as described in the body of the report. There is interval clearing of small patchy infiltrates in right lower lobe. Findings suggest possible waxing and waning multifocal pneumonia.   There are few subcentimeter nodules as described in the body of the report which may be part of pneumonia or neoplastic process. Short-term follow-up CT in 3 months may be considered. Coronary artery calcifications are seen. There is ectasia of ascending thoracic aorta.     CURRENT THERAPY: taxol/carbo/keytruda  INTERVAL HISTORY: [name] 71 y.o. male returns for evaluation prior to receiving Taxol [place].   He is here for prechemotherapy visit.  He complains of pain in the neck since he lost his pain medicine and was not able to take oxycodone for pain management.  Besides pain, he denies any change in his breathing, denies any new cough or worsening chest pain or chest pressure. He says the G-tube has been bothering him.  He is using about 3 cartons of [employer] every day, maintaining  his weight. Intermittent diarrhea, not every day.  No change in urinary habits. Rest of the pertinent 10 point ROS reviewed and negative.  Patient Active Problem List   Diagnosis Date Noted   Encounter for antineoplastic immunotherapy 10/11/2021   Pneumonitis 09/23/2021   Hypomagnesemia 08/05/2021   Dysphagia 07/24/2021   Neuropathy due to chemotherapeutic drug ([place]) 07/04/2021   Weight loss, unintentional 06/20/2021   Protein-calorie malnutrition, severe 04/17/2021   Perforated gastric ulcer ([place])    Gastrostomy tube dysfunction ([place]) 04/12/2021   Anemia due to antineoplastic chemotherapy  03/26/2021   Cancer related pain 03/26/2021   Laryngeal edema 03/11/2021   Nausea and vomiting 01/24/2021   Shortness of breath 01/10/2021   Mucositis due to antineoplastic therapy 01/10/2021   G tube feedings ([place]) 01/10/2021   Chemotherapy induced diarrhea 10/30/2020   Port-A-Cath in place 10/23/2020   Chronic hepatitis C without hepatic coma ([place]) 06/09/2019   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  MEDICAL HISTORY: Past Medical History:  Diagnosis Date   Cancer ([place])    Throat cancer 2019   ETOH abuse    Frequent urination    Glaucoma    Hepatitis C virus infection without hepatic coma    dx'ed in 11/2018   History of radiation therapy 05/12/19- 07/06/19   Larynx   Hypertension    Wears denture    upper only; lost lower denture    SURGICAL HISTORY: Past Surgical History:  Procedure Laterality Date   ANKLE SURGERY  2011   right ankle   COLONOSCOPY  02/2019   polyps - Dr Havery Moros   DIRECT LARYNGOSCOPY N/A 04/01/2019   Procedure: DIRECT LARYNGOSCOPY WITH BIOPSY;  Surgeon: Jodi Marble, MD;  [facility];  Service: ENT;  Laterality: N/A;   DIRECT LARYNGOSCOPY N/A 01/06/2020   Procedure: DIRECT LARYNGOSCOPY;  Surgeon: Izora Gala, MD;  Location: [facility];  Service: ENT;  Laterality: N/A;   DIRECT LARYNGOSCOPY N/A 08/13/2020   Procedure: DIRECT LARYNGOSCOPY;  Surgeon: Izora Gala, MD;  Location: [facility];  Service: ENT;  Laterality: N/A;   ESOPHAGOSCOPY N/A 04/01/2019   Procedure: ESOPHAGOSCOPY;  Surgeon: Jodi Marble, MD;  Location: [facility];  Service: ENT;  Laterality: N/A;   EXCISION ORAL TUMOR Right 08/13/2020   Procedure: BIOPSY OF OROPHARYNGEAL MASS;  Surgeon: Izora Gala, MD;  Location: [facility];  Service: ENT;  Laterality: Right;   EYE SURGERY Right    IR CM INJ ANY COLONIC TUBE W/FLUORO  12/20/2020   IR CM INJ ANY COLONIC TUBE W/FLUORO  03/20/2021   IR GASTROSTOMY TUBE  MOD SED  09/24/2020   IR IMAGING GUIDED PORT INSERTION  09/24/2020   IR RADIOLOGIST EVAL & MGMT  07/02/2021   KNEE SURGERY     LAPAROTOMY N/A 04/12/2021   Procedure: EXPLORATORY LAPAROTOMY, REPAIR OF DUODENAL ULCER WITH Silvestre Gunner;  Surgeon: Felicie Morn, MD;  Location: [facility];  Service: General;  Laterality: N/A;   LARYNGOSCOPY AND BRONCHOSCOPY N/A 04/01/2019   Procedure: BRONCHOSCOPY;  Surgeon: Jodi Marble, MD;  Location: [facility];  Service: ENT;  Laterality: N/A;   RADICAL NECK DISSECTION N/A 01/06/2020   Procedure: RADICAL NECK DISSECTION;  Surgeon: Izora Gala, MD;  Location: [facility];  Service: ENT;  Laterality: N/A;    SOCIAL HISTORY: Social History   Socioeconomic History   Marital status: Single    Spouse name: Not on file   Number of children: 2  Years of education: Not on file   Highest education level: Not on file  Occupational History   Not on file  Tobacco Use   Smoking status: Former    Years: 50.00    Types: Cigarettes    Quit date: 06/01/2020    Years since quitting: 1.4   Smokeless tobacco: Never  Vaping Use   Vaping Use: Never used  Substance and Sexual Activity   Alcohol use: Yes    Alcohol/week: 4.0 standard drinks    Types: 4 Cans of beer per week    Comment: 40oz beer daily   Drug use: Yes    Types: Cocaine    Comment: none in 2 yrs   Sexual activity: Yes    Partners: Female  Other Topics Concern   Not on file  Social History Narrative   Patient is divorced with 2 children.   Patient is currently living with his sister.   Patient with a history of smoking a third of pack of cigarettes daily for 50 years.  Patient currently smoking 2 to 3 cigarettes/day.   Patient has never used smokeless tobacco.   Patient with occasional use of alcohol.   Patient last used cocaine approximately 6 months ago.  Patient denies use of marijuana.   Social Determinants of Health   Financial Resource Strain: Not on file  Food Insecurity: Not on file   Transportation Needs: Not on file  Physical Activity: Not on file  Stress: Not on file  Social Connections: Not on file  Intimate Partner Violence: Not on file    FAMILY HISTORY: Family History  Problem Relation Age of Onset   Breast cancer Sister    Colon cancer Brother 63       ????   Cancer Brother     Review of Systems  Constitutional:  Positive for fatigue. Negative for appetite change, chills, fever and unexpected weight change.  HENT:   Positive for sore throat and trouble swallowing. Negative for hearing loss and lump/mass.   Eyes:  Negative for eye problems and icterus.  Respiratory:  Negative for chest tightness, cough and shortness of breath.   Cardiovascular:  Negative for chest pain, leg swelling and palpitations.  Gastrointestinal:  Negative for abdominal distention, abdominal pain, constipation, diarrhea, nausea and vomiting.  Endocrine: Negative for hot flashes.  Genitourinary:  Negative for difficulty urinating.   Musculoskeletal:  Negative for arthralgias.  Skin:  Negative for itching and rash.  Neurological:  Negative for dizziness, extremity weakness, headaches and numbness.  Hematological:  Negative for adenopathy. Does not bruise/bleed easily.  Psychiatric/Behavioral:  Negative for depression. The patient is not nervous/anxious.      PHYSICAL EXAMINATION  ECOG PERFORMANCE STATUS: 1 - Symptomatic but completely ambulatory  BP 125/77 (BP Location: Left Arm, Patient Position: Sitting)    Pulse 99    Temp 98.1 F (36.7 C) (Temporal)    Resp 16    Ht 5\' 7"  (1.702 m)    Wt 108 lb 8 oz (49.2 kg)    SpO2 100%    BMI 16.99 kg/m    Physical Exam Constitutional:      General: He is not in acute distress.    Appearance: Normal appearance. He is not toxic-appearing.  HENT:     Head: Normocephalic and atraumatic.  Eyes:     General: No scleral icterus. Cardiovascular:     Rate and Rhythm: Normal rate and regular rhythm.     Pulses: Normal pulses.  Heart sounds: Normal heart sounds.  Pulmonary:     Effort: Pulmonary effort is normal.     Breath sounds: Normal breath sounds.  Abdominal:     General: Abdomen is flat. Bowel sounds are normal. There is no distension.     Palpations: Abdomen is soft.     Tenderness: There is abdominal tenderness (Tenderness mostly around the G-tube). There is no guarding.     Comments: There appears to be pink granulation tissue around the G-tube but no evidence of infection.  No evidence of acute abdomen.  Musculoskeletal:        General: No swelling.     Cervical back: Neck supple.  Lymphadenopathy:     Cervical: No cervical adenopathy.  Skin:    General: Skin is warm and dry.     Capillary Refill: Capillary refill takes less than 2 seconds.     Findings: No rash.  Neurological:     General: No focal deficit present.     Mental Status: He is alert.  Psychiatric:        Mood and Affect: Mood normal.        Behavior: Behavior normal.    LABORATORY DATA:  CBC    Component Value Date/Time   WBC 4.0 11/01/2021 1157   WBC 2.8 (L) 10/25/2021 1348   RBC 3.11 (L) 11/01/2021 1157   HGB 10.2 (L) 11/01/2021 1157   HCT 30.8 (L) 11/01/2021 1157   PLT 222 11/01/2021 1157   MCV 99.0 11/01/2021 1157   MCH 32.8 11/01/2021 1157   MCHC 33.1 11/01/2021 1157   RDW 13.3 11/01/2021 1157   LYMPHSABS 0.8 11/01/2021 1157   MONOABS 0.5 11/01/2021 1157   EOSABS 0.1 11/01/2021 1157   BASOSABS 0.0 11/01/2021 1157    CMP     Component Value Date/Time   NA 134 (L) 11/01/2021 1157   K 4.1 11/01/2021 1157   CL 100 11/01/2021 1157   CO2 29 11/01/2021 1157   GLUCOSE 141 (H) 11/01/2021 1157   BUN 18 11/01/2021 1157   CREATININE 0.54 (L) 11/01/2021 1157   CREATININE 0.61 (L) 10/26/2019 1523   CALCIUM 9.1 11/01/2021 1157   PROT 6.9 11/01/2021 1157   ALBUMIN 4.0 11/01/2021 1157   AST 15 11/01/2021 1157   ALT 9 11/01/2021 1157   ALT 53 (H) 09/06/2019 1543   ALKPHOS 38 11/01/2021 1157   BILITOT 0.4  11/01/2021 1157   GFRNONAA >60 11/01/2021 1157   GFRAA >60 01/03/2020 1355   GFRAA >60 05/04/2019 1207       ASSESSMENT and THERAPY PLAN:   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 Moomaw is a patient with complicated history of supraglottic tumor with cancer recurrence in the head and neck region currently on weekly CarboTaxol and every 21-day Keytruda.   We had to hold immunotherapy for cycle 8 because of concern of pneumonitis from immunotherapy versus recovering COVID-19 infection.  However since it was thought to be grade 1, we gave him a challenge of short course of steroids as well as antibiotics for possible atypical pneumonia and repeat imaging showed recovering pneumonitis hence we have discussed about proceeding with immunotherapy. He currently is on weekly CarboTaxol and every 21-day Keytruda.  After today's cycle, we will proceed with repeat imaging to assess the response again.  He can proceed with week 12 of CarboTaxol next week.  If he happens to have stable disease, at that point I might just continue immunotherapy and monitor him.  He  denies any major toxicities from treatment so far however he is not a very good historian either.   No evidence of pneumonitis or acute abdomen on my exam today. Okay to proceed with chemo today if labs are within parameters.  Cancer related pain He says he is in much more pain since he has not been able to take oxycodone.  He tells me that he is lost his medication while he was at a motel in [place].  Unfortunately since this is controlled medication prescription, have discussed that he may have to take some Tylenol and ibuprofen for the interim and we should be able to refill his medication next week.  He is agreeable to this.  His pain is tolerable today.  G tube feedings [facility]) Patient complains of pain around the G-tube.  He is able to tolerate [facility] as instructed, weight has been stable.  He cannot eat because of  risk of aspiration. I have examined the G-tube, there is no evidence of infection, there is granulation tissue growing around the G-tube, bowel sounds are normal.  No evidence of acute abdomen. We will get this G-tube evaluated by radiology to see if he needs to be resized.   All questions were answered. The patient knows to call the clinic with any problems, questions or concerns. We can certainly see the patient much sooner if necessary.  Total encounter time: 30 minutes in face-to-face visit time, chart review, lab review, care coordination, discussion with nutrition, discussion with Dr. Chryl Heck, and documentation of the encounter.  *Total Encounter Time as defined by the Centers for Medicare and Medicaid Services includes, in addition to the face-to-face time of a patient visit (documented in the note above) non-face-to-face time: obtaining and reviewing outside history, ordering and reviewing medications, tests or procedures, care coordination (communications with other health care professionals or caregivers) and documentation in the medical record.

## 2021-11-01 NOTE — Assessment & Plan Note (Signed)
He says he is in much more pain since he has not been able to take oxycodone.  He tells me that he is lost his medication while he was at a motel in [place].  Unfortunately since this is controlled medication prescription, have discussed that he may have to take some Tylenol and ibuprofen for the interim and we should be able to refill his medication next week.  He is agreeable to this.  His pain is tolerable today.

## 2021-11-08 ENCOUNTER — Other Ambulatory Visit: Payer: Self-pay

## 2021-11-08 ENCOUNTER — Inpatient Hospital Stay: Payer: Medicare HMO | Admitting: Dietician

## 2021-11-08 ENCOUNTER — Other Ambulatory Visit: Payer: Self-pay | Admitting: Hematology and Oncology

## 2021-11-08 ENCOUNTER — Encounter (HOSPITAL_COMMUNITY): Payer: Self-pay

## 2021-11-08 ENCOUNTER — Inpatient Hospital Stay: Payer: Medicare HMO

## 2021-11-08 ENCOUNTER — Inpatient Hospital Stay (HOSPITAL_BASED_OUTPATIENT_CLINIC_OR_DEPARTMENT_OTHER): Payer: Medicare HMO | Admitting: Hematology and Oncology

## 2021-11-08 ENCOUNTER — Encounter: Payer: Self-pay | Admitting: Hematology and Oncology

## 2021-11-08 ENCOUNTER — Other Ambulatory Visit: Payer: Self-pay | Admitting: *Deleted

## 2021-11-08 ENCOUNTER — Telehealth: Payer: Self-pay | Admitting: Hematology and Oncology

## 2021-11-08 ENCOUNTER — Telehealth: Payer: Self-pay | Admitting: Dietician

## 2021-11-08 ENCOUNTER — Ambulatory Visit (HOSPITAL_COMMUNITY)
Admission: RE | Admit: 2021-11-08 | Discharge: 2021-11-08 | Disposition: A | Discharge status: Home / Self Care | Payer: Medicare HMO | Source: Ambulatory Visit | Attending: Hematology and Oncology | Admitting: Hematology and Oncology

## 2021-11-08 VITALS — BP 162/101 | HR 80 | Temp 98.2°F | Resp 17 | Wt 103.0 lb

## 2021-11-08 DIAGNOSIS — Z5111 Encounter for antineoplastic chemotherapy: Secondary | ICD-10-CM | POA: Diagnosis not present

## 2021-11-08 DIAGNOSIS — R634 Abnormal weight loss: Secondary | ICD-10-CM

## 2021-11-08 DIAGNOSIS — G893 Neoplasm related pain (acute) (chronic): Secondary | ICD-10-CM

## 2021-11-08 DIAGNOSIS — C321 Malignant neoplasm of supraglottis: Secondary | ICD-10-CM

## 2021-11-08 DIAGNOSIS — Z95828 Presence of other vascular implants and grafts: Secondary | ICD-10-CM

## 2021-11-08 LAB — COMPREHENSIVE METABOLIC PANEL
ALT: 9 U/L (ref 0–44)
AST: 12 U/L — ABNORMAL LOW (ref 15–41)
Albumin: 3.9 g/dL (ref 3.5–5.0)
Alkaline Phosphatase: 42 U/L (ref 38–126)
Anion gap: 7 (ref 5–15)
BUN: 7 mg/dL — ABNORMAL LOW (ref 8–23)
CO2: 29 mmol/L (ref 22–32)
Calcium: 9.5 mg/dL (ref 8.9–10.3)
Chloride: 93 mmol/L — ABNORMAL LOW (ref 98–111)
Creatinine, Ser: 0.47 mg/dL — ABNORMAL LOW (ref 0.61–1.24)
GFR, Estimated: >60 mL/min (ref >60)
Glucose, Bld: 118 mg/dL — ABNORMAL HIGH (ref 70–99)
Potassium: 4 mmol/L (ref 3.5–5.1)
Sodium: 129 mmol/L — ABNORMAL LOW (ref 135–145)
Total Bilirubin: 0.4 mg/dL (ref 0.3–1.2)
Total Protein: 7.4 g/dL (ref 6.5–8.1)

## 2021-11-08 LAB — CBC WITH DIFFERENTIAL/PLATELET
Abs Immature Granulocytes: 0.03 10*3/uL (ref 0.00–0.07)
Basophils Absolute: 0.1 10*3/uL (ref 0.0–0.1)
Basophils Relative: 1 %
Eosinophils Absolute: 0.1 10*3/uL (ref 0.0–0.5)
Eosinophils Relative: 2 %
HCT: 30.6 % — ABNORMAL LOW (ref 39.0–52.0)
Hemoglobin: 10.5 g/dL — ABNORMAL LOW (ref 13.0–17.0)
Immature Granulocytes: 1 %
Lymphocytes Relative: 21 %
Lymphs Abs: 0.8 10*3/uL (ref 0.7–4.0)
MCH: 33.2 pg (ref 26.0–34.0)
MCHC: 34.3 g/dL (ref 30.0–36.0)
MCV: 96.8 fL (ref 80.0–100.0)
Monocytes Absolute: 0.5 10*3/uL (ref 0.1–1.0)
Monocytes Relative: 12 %
Neutro Abs: 2.4 10*3/uL (ref 1.7–7.7)
Neutrophils Relative %: 63 %
Platelets: 192 10*3/uL (ref 150–400)
RBC: 3.16 MIL/uL — ABNORMAL LOW (ref 4.22–5.81)
RDW: 13.1 % (ref 11.5–15.5)
WBC: 3.9 10*3/uL — ABNORMAL LOW (ref 4.0–10.5)
nRBC: 0 % (ref 0.0–0.2)

## 2021-11-08 LAB — TSH: TSH: 1.208 u[IU]/mL (ref 0.320–4.118)

## 2021-11-08 MED ORDER — SILVER NITRATE-POT NITRATE 75-25 % EX MISC: 10.0000 | Freq: Once | CUTANEOUS | Status: DC

## 2021-11-08 MED ORDER — MORPHINE SULFATE (PF) 2 MG/ML IV SOLN
2.0000 mg | INTRAVENOUS | Status: DC | PRN
  Administered 2021-11-08: 2 mg via INTRAVENOUS
  Filled 2021-11-08: qty 1

## 2021-11-08 MED ORDER — SILVER NITRATE-POT NITRATE 75-25 % EX MISC
CUTANEOUS | Status: AC
  Filled 2021-11-08: qty 10

## 2021-11-08 MED ORDER — SODIUM CHLORIDE 0.9% FLUSH
10.0000 mL | Freq: Once | INTRAVENOUS | Status: AC
  Administered 2021-11-08: 10 mL

## 2021-11-08 MED ORDER — OXYCODONE HCL 5 MG PO TABS: 5.0000 mg | ORAL_TABLET | Freq: Four times a day (QID) | ORAL | 0 refills | Status: DC | PRN

## 2021-11-08 MED ORDER — MORPHINE SULFATE (PF) 2 MG/ML IV SOLN: 2.0000 mg | INTRAVENOUS | Status: DC | PRN

## 2021-11-08 MED ORDER — SODIUM CHLORIDE 0.9 % IV SOLN: Freq: Once | INTRAVENOUS | Status: AC

## 2021-11-08 NOTE — Assessment & Plan Note (Addendum)
Mr. Gesner is a patient with complicated history of supraglottic tumor with cancer recurrence in the head and neck region currently on weekly CarboTaxol and every 21-day Keytruda.   We had to hold immunotherapy for cycle 8 because of concern of pneumonitis from immunotherapy versus recovering COVID-19 infection.  However since it was thought to be grade 1, we gave him a challenge of short course of steroids as well as antibiotics for possible atypical pneumonia and repeat imaging showed recovering pneumonitis hence we have discussed about proceeding with immunotherapy. He currently is on weekly CarboTaxol and every 21-day Keytruda.  He however reported a lot of pain and he was feeling very weak.  His pain is mostly in his neck and around his G-tube.  He lost his medication in a motel in [place] and did not have enough pain medication to use. Physical examination, he appears to be hypertensive likely related to the pain.  Labs reviewed and okay however given his weight loss in the past week, immense pain, we will try to proceed with pain medication and fluids today.  We will try to reschedule his chemoimmunotherapy to next week.  Following that infusion, will repeat imaging and we will discuss about proceeding with further treatment. He will see me next week before his chemotherapy.  Sent an in basket message to our scheduler to reschedule.

## 2021-11-08 NOTE — Assessment & Plan Note (Signed)
Because of his severe pain around the G-tube, he has not been using his G-tube as instructed.  He has only been giving himself tube feeds of [employer].  He has lost significant amount of weight since last visit.  Encouraged him to use at least 4 feedings as recommended.  We will continue to monitor his weight.

## 2021-11-08 NOTE — Assessment & Plan Note (Signed)
His pain has been uncontrolled.  He lost his pain medication in a motel in [place] apparently.  I have refilled this today since he appears to be in significant pain.  I have told him that unfortunately we cannot refill pain medications in the future if he continues to lose them and he expressed understanding.  We will also give him a dose of morphine today while he is in the infusion.

## 2021-11-08 NOTE — Progress Notes (Signed)
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 SEE NOTE   04/01/2019 Surgery   Pre-Op Dx:   right supraglottic carcinoma   Post-op Dx: T2 N0 M0 (stage II) right supraglottic carcinoma   Proc: Direct laryngoscopy with biopsy.  Cervical esophagoscopy.  Bronchoscopy.   Surg:  [name]    Findings: A bulky necrotic and semi-pedunculated tumor coming off the lateral surface of the right aryepiglottic fold and the anterior and lateral aspect of the piriform sinus on the right side.  Airway was compromised by the tumor mass at intubation.   04/11/2019 Imaging   CT neck: IMPRESSION: The glottis is closed with suboptimal evaluation of the vocal cords. No asymmetry of the cords. Correlate with laryngoscopy results.   No enlarged lymph nodes in the neck. 7 mm right level 2 lymph node may be reactive. No definite pathologic lymph nodes in the neck.   04/11/2019 Imaging   CT neck  This CT was performed after surgical debulking of an exophytic laryngeal tumor.   Most apparent on series 2, image 51 there is abnormal asymmetric  hyperenhancement along the surface of the right piriform sinus, and perhaps also involving the bilateral AE folds.   04/22/2019 Imaging   PET: IMPRESSION: 1. Mild residual activity in the posterior RIGHT hypopharynx confined to the mucosa. 2. No evidence of hypermetabolic metastatic lymph nodes in LEFT or RIGHT neck. 3. No evidence distant metastatic disease.   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 New FDG avid right level 2 cervical lymph node concerning for recurrent disease. No findings of distant metastatic disease within the chest, abdomen or pelvis. 2. Mild nonspecific increased uptake in the right side of larynx is similar to previous exam. 3. Aortic Atherosclerosis (ICD10-I70.0). Coronary artery calcifications.    11/21/2019 Pathology Results   SURGICAL PATHOLOGY  CASE: [id number]  PATIENT: Jhace Forsman  Surgical Pathology Report   FINAL MICROSCOPIC DIAGNOSIS:   A. LYMPH NODE, RIGHT CERVICAL:  - Poorly differentiated carcinoma consistent with metastatic squamous cell carcinoma.  - See comment   01/06/2020 Pathology Results   SURGICAL PATHOLOGY  CASE: [id number]  PATIENT: [place]  Surgical Pathology Report  Clinical History: metastatic cancer to cervical lymph nodes (cm)   FINAL MICROSCOPIC DIAGNOSIS:   A. LYMPH NODE, RIGHT MODIFIED, DISSECTION:  - Metastatic squamous cell carcinoma in four of nineteen lymph nodes (4/19).  - Largest metastatic nodule is 3.1 cm with 5 mm  extra-nodal extension.  - Lymphovascular space involvement in peri-nodal connective tissue.  - See comment.    01/06/2020 Surgery   PRE-OPERATIVE DIAGNOSIS:  METASTATIC CANCER TO CERVICAL LYMPH NODES   POST-OPERATIVE DIAGNOSIS:  METASTATIC CANCER TO CERVICAL LYMPH NODES   PROCEDURE:  Procedure(s): DIRECT LARYNGOSCOPY MODIFIED RADICAL NECK DISSECTION   SURGEON:  [name], MD     SPECIMEN: Right modified neck dissection including levels 2 and 3, including the internal jugular vein, suture marks the inferior jugular vein stump.   07/05/2020 Imaging   1. Sequelae of radiation and right neck dissection from the previous contrasted Neck CT.   2. Asymmetric soft tissue thickening and enhancement along the right lateral pharynx. Although this might be asymmetric mucositis, the appearance is suspicious and recommend direct inspection. NI-RADS category 2a.   3. Suspicious small 6 mm hyperenhancing nodular soft tissue along the posterior margin of the neck dissection at right level 3. NI-RADS category 2 vs 3 - although this is likely too small for imaging guided biopsy. Repeat PET-CT may be most valuable.   4. Mild inflammatory appearing right upper lobe centrilobular ground-glass opacity, new since February. Consider mild or developing right upper lobe infection. Post radiation changes to the lung apices.       08/13/2020 Pathology Results   A. OROPHARNYX, BIOPSY:  - Poorly differentiated squamous cell carcinoma.  - See comment.    08/13/2020 Surgery   POST-OPERATIVE DIAGNOSIS:  Tonsillary Mass History of Laryngeal  Cancer   PROCEDURE:  Procedure(s): DIRECT LARYNGOSCOPY BIOPSY OF OROPHARYNGEAL MASS   08/30/2020 PET scan   1. Recurrence of head neck carcinoma with a broad hypermetabolic pharyngeal mucosal lesion involving the RIGHT lateral posterior oropharynx and hypopharynx extending from the palatine tonsil to the vallecula. 2. Hypermetabolic lymph node posterior  sternocleidomastoid muscle on the RIGHT (level 3). 3. Hypermetabolic nodule within the LEFT parotid glands favored small primary parotid neoplasm. 4. No evidence thoracic metastasis   09/24/2020 - 10/30/2020 Chemotherapy   He received weekly cisplatin   01/31/2021 PET scan   Findings of worsening nodal disease with a node or group of nodes in the LEFT neck and a RIGHT supraclavicular lymph node as discussed.   Post treatment changes with soft tissue fullness in the area of the prior tumor but with markedly diminished metabolic activity.     03/11/2021 Imaging   Ct neck  Diffuse pharyngeal and laryngeal mucosal and submucosal edema probably related to acute radiation injury. The airway is narrow and could possibly be critically narrowed. I do not see any evidence of recurrent mucosal or submucosal tumor identifiable.   Similar appearance of the recurrent malignant lymphadenopathy on the left at level 2 and level 3 and on the right in the low supraclavicular region just above and behind the clavicular head.   04/12/2021 Imaging   Ct abdomen and pelvis  1. Contrast material in the peritoneal cavity of the upper abdomen suggesting leak along the gastrostomy tract. 2. Gastrostomy tube is appropriately positioned. No evidence of abscess. 3. 1.3 cm right middle lobe nodule, new since 01/31/2021. Favor infectious/inflammatory over primary/secondary neoplasm given the relatively short time frame of development.   04/12/2021 Surgery   Postoperative Diagnosis: Perforated duodenal ulcer (1st  portion)   Surgical Procedure:  Modified Graham patch of duodenal ulcer Gastrostomy tube exchange   Operative Team Members:  Surgeon(s) and Role:    * Stechschulte, Nickola Major, MD - Primary     Drains:  (19 Fr) Jackson-Pratt drain(s) with closed bulb suction in the right upper quadrant near the duodenal ulcer repair and Gastrostomy Tube 26 fr   05/09/2021 Imaging   1. Progression of lesion in the right tonsil  with central fluid collection and peripheral enhancement. This was a site of prior recurrent sick could represent progressive tumor or abscess. Continued follow-up recommended. 2. Progressive malignant lymphadenopathy in the left neck. Stable lymphadenopathy right supraclavicular region. 3. New area of stranding in the superior segment left lower lobe but certain etiology. Attention on follow-up recommended.     06/13/2021 -  Chemotherapy   Patient is on Treatment Plan :  Paclitaxel + Carboplatin q7d     06/13/2021 -  Chemotherapy   Patient is on Treatment Plan : HEAD/NECK Pembrolizumab Q21D     07/18/2021 Imaging   Ct chest 1. No highly specific findings identified to suggest pulmonary or nodal metastasis within the chest 2. Multifocal patchy areas of ground-glass and nodular airspace densities. Surrounding postinflammatory changes are noted with thickening of the peribronchovascular interstitium. Imaging findings are concerning for sequelae of aspiration and/or multifocal infection. Follow-up imaging after appropriate antibiotic therapy is recommended with repeat CT of the chest in 3 months to ensure resolution and to rule out the possibility of underlying metastatic nodule. 3. Coronary artery calcifications. 4. Aortic Atherosclerosis (ICD10-I70.0) and Emphysema (ICD10-J43.9).   07/18/2021 Imaging   CT neck  1. Positive treatment response with decreased size of nodal metastases in the left jugular chain. There is likely residual active tumor in the treated nodes. 2. Ulcerated lesions in the bilateral palatine fossa, progressed on the left and suspicious for synchronous tumors.   07/23/2021 Cancer Staging   Staging form: Larynx - Supraglottis, AJCC 8th Edition - Clinical stage from 07/23/2021: Stage IVA (rcT2, cN2b, cM0) - Signed by [name], MD on 07/23/2021 Stage prefix: Recurrence    09/19/2021 Imaging   IMPRESSION: 1. Positive treatment response at the cervical lymph nodes.  No residual nodal disease identified currently. 2. Persisting bilateral palatine tonsil lesion with mild size increase on the right.     09/19/2021 Imaging   IMPRESSION: No new significant lymphadenopathy seen in the mediastinum.   There is interval increase in infiltrates at multiple sites in both lungs as described in the body of the report. There is interval clearing of small patchy infiltrates in right lower lobe. Findings suggest possible waxing and waning multifocal pneumonia.   There are few subcentimeter nodules as described in the body of the report which may be part of pneumonia or neoplastic process. Short-term follow-up CT in 3 months may be considered. Coronary artery calcifications are seen. There is ectasia of ascending thoracic aorta.     CURRENT THERAPY: taxol/carbo/keytruda  INTERVAL HISTORY: [name] 71 y.o. male returns for evaluation prior to receiving Taxol [place].  Mr. Mcchristian is here for planned infusion.  Apparently he was feeling so weak and was in so much pain that when he was laying down in the blue room before infusion.   I got a phone call from our nurse and infusion to see if we can check on him.  He tells me that he has been having a lot of pain in his neck and his neck sometimes feels  like it is closing up on him.  He also complains of a lot of pain at the G-tube.  He went to see IR today and they had silver nitrate cream on it for cauterization.  He has not been using the G-tube as instructed because of the pain around it.  He has only been using 2 of the [employer] every day and has lost about 5 or 6 pounds since his last visit.  He denies any changes in breath, no chest pain, no worsening shortness of breath.  His last bowel movement was last night, slightly loose but no overt diarrhea.  No fevers or chills reported. Rest of the pertinent 10 point ROS reviewed and negative.  Patient Active Problem List   Diagnosis Date Noted    Encounter for antineoplastic immunotherapy 10/11/2021   Pneumonitis 09/23/2021   Hypomagnesemia 08/05/2021   Dysphagia 07/24/2021   Neuropathy due to chemotherapeutic drug ([place]) 07/04/2021   Weight loss, unintentional 06/20/2021   Protein-calorie malnutrition, severe 04/17/2021   Perforated gastric ulcer ([place])    Gastrostomy tube dysfunction ([place]) 04/12/2021   Anemia due to antineoplastic chemotherapy 03/26/2021   Cancer related pain 03/26/2021   Laryngeal edema 03/11/2021   Nausea and vomiting 01/24/2021   Shortness of breath 01/10/2021   Mucositis due to antineoplastic therapy 01/10/2021   G tube feedings ([place]) 01/10/2021   Chemotherapy induced diarrhea 10/30/2020   Port-A-Cath in place 10/23/2020   Chronic hepatitis C without hepatic coma ([place]) 06/09/2019   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  MEDICAL HISTORY: Past Medical History:  Diagnosis Date   Cancer ([place])    Throat cancer 2019   ETOH abuse    Frequent urination    Glaucoma    Hepatitis C virus infection without hepatic coma    dx'ed in 11/2018   History of radiation therapy 05/12/19- 07/06/19   Larynx   Hypertension    Wears denture    upper only; lost lower denture    SURGICAL HISTORY: Past Surgical History:  Procedure Laterality Date   ANKLE SURGERY  2011   right ankle   COLONOSCOPY  02/2019   polyps - Dr Havery Moros   DIRECT LARYNGOSCOPY N/A 04/01/2019   Procedure: DIRECT LARYNGOSCOPY WITH BIOPSY;  Surgeon: Jodi Marble, MD;  Location: [facility];  Service: ENT;  Laterality: N/A;   DIRECT LARYNGOSCOPY N/A 01/06/2020   Procedure: DIRECT LARYNGOSCOPY;  Surgeon: Izora Gala, MD;  Location: [facility];  Service: ENT;  Laterality: N/A;   DIRECT LARYNGOSCOPY N/A 08/13/2020   Procedure: DIRECT LARYNGOSCOPY;  Surgeon: Izora Gala, MD;  Location: [facility];  Service: ENT;  Laterality: N/A;   ESOPHAGOSCOPY N/A 04/01/2019   Procedure:  ESOPHAGOSCOPY;  Surgeon: Jodi Marble, MD;  Location: [facility];  Service: ENT;  Laterality: N/A;   EXCISION ORAL TUMOR Right 08/13/2020   Procedure: BIOPSY OF OROPHARYNGEAL MASS;  Surgeon: Izora Gala, MD;  Location: [facility];  Service: ENT;  Laterality: Right;   EYE SURGERY Right    IR CM INJ ANY COLONIC TUBE W/FLUORO  12/20/2020   IR CM INJ ANY COLONIC TUBE W/FLUORO  03/20/2021   IR GASTROSTOMY TUBE MOD SED  09/24/2020   IR IMAGING GUIDED PORT INSERTION  09/24/2020   IR RADIOLOGIST EVAL & MGMT  07/02/2021   KNEE SURGERY     LAPAROTOMY N/A 04/12/2021   Procedure: EXPLORATORY LAPAROTOMY, REPAIR OF DUODENAL ULCER WITH GRAHAM PATCH;  Surgeon: Thermon Leyland,  Nickola Major, MD;  Location: [facility];  Service: General;  Laterality: N/A;   LARYNGOSCOPY AND BRONCHOSCOPY N/A 04/01/2019   Procedure: BRONCHOSCOPY;  Surgeon: Jodi Marble, MD;  Location: [facility];  Service: ENT;  Laterality: N/A;   RADICAL NECK DISSECTION N/A 01/06/2020   Procedure: RADICAL NECK DISSECTION;  Surgeon: Izora Gala, MD;  Location: [facility];  Service: ENT;  Laterality: N/A;    SOCIAL HISTORY: Social History   Socioeconomic History   Marital status: Single    Spouse name: Not on file   Number of children: 2   Years of education: Not on file   Highest education level: Not on file  Occupational History   Not on file  Tobacco Use   Smoking status: Former    Years: 50.00    Types: Cigarettes    Quit date: 06/01/2020    Years since quitting: 1.4   Smokeless tobacco: Never  Vaping Use   Vaping Use: Never used  Substance and Sexual Activity   Alcohol use: Yes    Alcohol/week: 4.0 standard drinks    Types: 4 Cans of beer per week    Comment: 40oz beer daily   Drug use: Yes    Types: Cocaine    Comment: none in 2 yrs   Sexual activity: Yes    Partners: Female  Other Topics Concern   Not on file  Social History Narrative   Patient is divorced with 2 children.   Patient is currently living with his sister.    Patient with a history of smoking a third of pack of cigarettes daily for 50 years.  Patient currently smoking 2 to 3 cigarettes/day.   Patient has never used smokeless tobacco.   Patient with occasional use of alcohol.   Patient last used cocaine approximately 6 months ago.  Patient denies use of marijuana.   Social Determinants of Health   Financial Resource Strain: Not on file  Food Insecurity: Not on file  Transportation Needs: Not on file  Physical Activity: Not on file  Stress: Not on file  Social Connections: Not on file  Intimate Partner Violence: Not on file    FAMILY HISTORY: Family History  Problem Relation Age of Onset   Breast cancer Sister    Colon cancer Brother 33       ????   Cancer Brother     Review of Systems  Constitutional:  Positive for fatigue. Negative for appetite change, chills, fever and unexpected weight change.  HENT:   Positive for sore throat and trouble swallowing. Negative for hearing loss and lump/mass.   Eyes:  Negative for eye problems and icterus.  Respiratory:  Negative for chest tightness, cough and shortness of breath.   Cardiovascular:  Negative for chest pain, leg swelling and palpitations.  Gastrointestinal:  Positive for abdominal pain (Pain around the G-tube). Negative for abdominal distention, constipation, diarrhea, nausea and vomiting.  Endocrine: Negative for hot flashes.  Genitourinary:  Negative for difficulty urinating.   Musculoskeletal:  Negative for arthralgias.  Skin:  Negative for itching and rash.  Neurological:  Negative for dizziness, extremity weakness, headaches and numbness.  Hematological:  Negative for adenopathy. Does not bruise/bleed easily.  Psychiatric/Behavioral:  Negative for depression. The patient is not nervous/anxious.      PHYSICAL EXAMINATION  ECOG PERFORMANCE STATUS: 1 - Symptomatic but completely ambulatory V/S reviewed.  Blood pressure slightly high today likely because of pain  Physical  Exam Constitutional:      General: He is in  acute distress (He appears to be in mild pain distress).     Appearance: Normal appearance. He is not toxic-appearing.  HENT:     Head: Normocephalic and atraumatic.  Eyes:     General: No scleral icterus. Cardiovascular:     Rate and Rhythm: Normal rate and regular rhythm.     Pulses: Normal pulses.     Heart sounds: Normal heart sounds.  Pulmonary:     Effort: Pulmonary effort is normal.     Breath sounds: Normal breath sounds.  Abdominal:     General: Abdomen is flat. Bowel sounds are normal. There is no distension.     Palpations: Abdomen is soft.     Tenderness: There is abdominal tenderness (Tenderness mostly around the G-tube). There is no guarding.     Comments: There appears to be pink granulation tissue around the G-tube but no evidence of infection.  No evidence of acute abdomen.  Musculoskeletal:        General: No swelling.     Cervical back: Neck supple.  Lymphadenopathy:     Cervical: No cervical adenopathy.  Skin:    General: Skin is warm and dry.     Capillary Refill: Capillary refill takes less than 2 seconds.     Findings: No rash.  Neurological:     General: No focal deficit present.     Mental Status: He is alert.  Psychiatric:        Mood and Affect: Mood normal.        Behavior: Behavior normal.    LABORATORY DATA:  CBC    Component Value Date/Time   WBC 3.9 (L) 11/08/2021 1127   RBC 3.16 (L) 11/08/2021 1127   HGB 10.5 (L) 11/08/2021 1127   HGB 10.2 (L) 11/01/2021 1157   HCT 30.6 (L) 11/08/2021 1127   PLT 192 11/08/2021 1127   PLT 222 11/01/2021 1157   MCV 96.8 11/08/2021 1127   MCH 33.2 11/08/2021 1127   MCHC 34.3 11/08/2021 1127   RDW 13.1 11/08/2021 1127   LYMPHSABS 0.8 11/08/2021 1127   MONOABS 0.5 11/08/2021 1127   EOSABS 0.1 11/08/2021 1127   BASOSABS 0.1 11/08/2021 1127    CMP     Component Value Date/Time   NA 129 (L) 11/08/2021 1127   K 4.0 11/08/2021 1127   CL 93 (L) 11/08/2021  1127   CO2 29 11/08/2021 1127   GLUCOSE 118 (H) 11/08/2021 1127   BUN 7 (L) 11/08/2021 1127   CREATININE 0.47 (L) 11/08/2021 1127   CREATININE 0.54 (L) 11/01/2021 1157   CREATININE 0.61 (L) 10/26/2019 1523   CALCIUM 9.5 11/08/2021 1127   PROT 7.4 11/08/2021 1127   ALBUMIN 3.9 11/08/2021 1127   AST 12 (L) 11/08/2021 1127   AST 15 11/01/2021 1157   ALT 9 11/08/2021 1127   ALT 9 11/01/2021 1157   ALT 53 (H) 09/06/2019 1543   ALKPHOS 42 11/08/2021 1127   BILITOT 0.4 11/08/2021 1127   BILITOT 0.4 11/01/2021 1157   GFRNONAA >60 11/08/2021 1127   GFRNONAA >60 11/01/2021 1157   GFRAA >60 01/03/2020 1355   GFRAA >60 05/04/2019 1207       ASSESSMENT and THERAPY PLAN:   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 Buffone is a patient with complicated history of supraglottic tumor with cancer recurrence in the head and neck region currently on weekly CarboTaxol and every 21-day Keytruda.   We had to hold immunotherapy for cycle 8 because of concern of  pneumonitis from immunotherapy versus recovering COVID-19 infection.  However since it was thought to be grade 1, we gave him a challenge of short course of steroids as well as antibiotics for possible atypical pneumonia and repeat imaging showed recovering pneumonitis hence we have discussed about proceeding with immunotherapy. He currently is on weekly CarboTaxol and every 21-day Keytruda.  He however reported a lot of pain and he was feeling very weak.  His pain is mostly in his neck and around his G-tube.  He lost his medication in a motel in [place] and did not have enough pain medication to use. Physical examination, he appears to be hypertensive likely related to the pain.  Labs reviewed and okay however given his weight loss in the past week, immense pain, we will try to proceed with pain medication and fluids today.  We will try to reschedule his chemoimmunotherapy to next week.  Following that infusion, will repeat  imaging and we will discuss about proceeding with further treatment. He will see me next week before his chemotherapy.  Sent an in basket message to our scheduler to reschedule.  Weight loss, unintentional Because of his severe pain around the G-tube, he has not been using his G-tube as instructed.  He has only been giving himself tube feeds of [employer].  He has lost significant amount of weight since last visit.  Encouraged him to use at least 4 feedings as recommended.  We will continue to monitor his weight.  Cancer related pain His pain has been uncontrolled.  He lost his pain medication in a motel in [place] apparently.  I have refilled this today since he appears to be in significant pain.  I have told him that unfortunately we cannot refill pain medications in the future if he continues to lose them and he expressed understanding.  We will also give him a dose of morphine today while he is in the infusion.    All questions were answered. The patient knows to call the clinic with any problems, questions or concerns. We can certainly see the patient much sooner if necessary.  Total encounter time: 30 minutes in face-to-face visit time, chart review, lab review, care coordination, discussion with nutrition, discussion with Dr. Chryl Heck, and documentation of the encounter.  *Total Encounter Time as defined by the Centers for Medicare and Medicaid Services includes, in addition to the face-to-face time of a patient visit (documented in the note above) non-face-to-face time: obtaining and reviewing outside history, ordering and reviewing medications, tests or procedures, care coordination (communications with other health care professionals or caregivers) and documentation in the medical record.

## 2021-11-08 NOTE — Telephone Encounter (Signed)
Scheduled appointment per 02/23 los. Left message with details of appointment.

## 2021-11-08 NOTE — Telephone Encounter (Signed)
Spoke with patient care giver Verdene Lennert). She reports patient is unable to receive additional shipment of formula and supplies until medical necessity form has been completed. Veronica unsure of when and to which provider this was sent. Awaiting return call from [place].   Patient reports having one case (12 cartons) at this time. Will leave case of [employer] [place] 1.4 at registration desk for pick up tomorrow 2/25. Patient and caregiver appreciative.

## 2021-11-13 ENCOUNTER — Inpatient Hospital Stay (HOSPITAL_BASED_OUTPATIENT_CLINIC_OR_DEPARTMENT_OTHER): Payer: Medicare HMO | Admitting: Hematology and Oncology

## 2021-11-13 ENCOUNTER — Inpatient Hospital Stay: Payer: Medicare HMO | Admitting: Dietician

## 2021-11-13 ENCOUNTER — Inpatient Hospital Stay: Payer: Medicare HMO

## 2021-11-13 ENCOUNTER — Ambulatory Visit (HOSPITAL_COMMUNITY): Payer: Medicare HMO

## 2021-11-13 ENCOUNTER — Inpatient Hospital Stay: Payer: Medicare HMO | Attending: Hematology and Oncology

## 2021-11-13 ENCOUNTER — Other Ambulatory Visit: Payer: Self-pay

## 2021-11-13 ENCOUNTER — Encounter: Payer: Self-pay | Admitting: Hematology and Oncology

## 2021-11-13 VITALS — BP 144/107 | HR 90

## 2021-11-13 DIAGNOSIS — Z79899 Other long term (current) drug therapy: Secondary | ICD-10-CM | POA: Diagnosis not present

## 2021-11-13 DIAGNOSIS — Z95828 Presence of other vascular implants and grafts: Secondary | ICD-10-CM

## 2021-11-13 DIAGNOSIS — C321 Malignant neoplasm of supraglottis: Secondary | ICD-10-CM

## 2021-11-13 DIAGNOSIS — Z5112 Encounter for antineoplastic immunotherapy: Secondary | ICD-10-CM

## 2021-11-13 DIAGNOSIS — R634 Abnormal weight loss: Secondary | ICD-10-CM | POA: Diagnosis not present

## 2021-11-13 DIAGNOSIS — Z5111 Encounter for antineoplastic chemotherapy: Secondary | ICD-10-CM | POA: Insufficient documentation

## 2021-11-13 LAB — CBC WITH DIFFERENTIAL/PLATELET
Abs Immature Granulocytes: 0.03 10*3/uL (ref 0.00–0.07)
Basophils Absolute: 0 10*3/uL (ref 0.0–0.1)
Basophils Relative: 1 %
Eosinophils Absolute: 0.1 10*3/uL (ref 0.0–0.5)
Eosinophils Relative: 3 %
HCT: 29.7 % — ABNORMAL LOW (ref 39.0–52.0)
Hemoglobin: 10.4 g/dL — ABNORMAL LOW (ref 13.0–17.0)
Immature Granulocytes: 1 %
Lymphocytes Relative: 23 %
Lymphs Abs: 0.7 10*3/uL (ref 0.7–4.0)
MCH: 33.7 pg (ref 26.0–34.0)
MCHC: 35 g/dL (ref 30.0–36.0)
MCV: 96.1 fL (ref 80.0–100.0)
Monocytes Absolute: 0.4 10*3/uL (ref 0.1–1.0)
Monocytes Relative: 13 %
Neutro Abs: 1.8 10*3/uL (ref 1.7–7.7)
Neutrophils Relative %: 59 %
Platelets: 205 10*3/uL (ref 150–400)
RBC: 3.09 MIL/uL — ABNORMAL LOW (ref 4.22–5.81)
RDW: 13.2 % (ref 11.5–15.5)
WBC: 3 10*3/uL — ABNORMAL LOW (ref 4.0–10.5)
nRBC: 0 % (ref 0.0–0.2)

## 2021-11-13 LAB — COMPREHENSIVE METABOLIC PANEL
ALT: 9 U/L (ref 0–44)
AST: 13 U/L — ABNORMAL LOW (ref 15–41)
Albumin: 4.1 g/dL (ref 3.5–5.0)
Alkaline Phosphatase: 49 U/L (ref 38–126)
Anion gap: 8 (ref 5–15)
BUN: 7 mg/dL — ABNORMAL LOW (ref 8–23)
CO2: 30 mmol/L (ref 22–32)
Calcium: 9.7 mg/dL (ref 8.9–10.3)
Chloride: 96 mmol/L — ABNORMAL LOW (ref 98–111)
Creatinine, Ser: 0.52 mg/dL — ABNORMAL LOW (ref 0.61–1.24)
GFR, Estimated: >60 mL/min (ref >60)
Glucose, Bld: 96 mg/dL (ref 70–99)
Potassium: 3.8 mmol/L (ref 3.5–5.1)
Sodium: 134 mmol/L — ABNORMAL LOW (ref 135–145)
Total Bilirubin: 0.4 mg/dL (ref 0.3–1.2)
Total Protein: 7.7 g/dL (ref 6.5–8.1)

## 2021-11-13 LAB — TSH: TSH: 0.667 u[IU]/mL (ref 0.320–4.118)

## 2021-11-13 MED ORDER — PALONOSETRON HCL INJECTION 0.25 MG/5ML
0.2500 mg | Freq: Once | INTRAVENOUS | Status: AC
  Administered 2021-11-13: 0.25 mg via INTRAVENOUS
  Filled 2021-11-13: qty 5

## 2021-11-13 MED ORDER — SODIUM CHLORIDE 0.9 % IV SOLN
200.0000 mg | Freq: Once | INTRAVENOUS | Status: AC
  Administered 2021-11-13: 200 mg via INTRAVENOUS
  Filled 2021-11-13: qty 200

## 2021-11-13 MED ORDER — HEPARIN SOD (PORK) LOCK FLUSH 100 UNIT/ML IV SOLN
500.0000 [IU] | Freq: Once | INTRAVENOUS | Status: AC | PRN
  Administered 2021-11-13: 500 [IU]

## 2021-11-13 MED ORDER — DIPHENHYDRAMINE HCL 50 MG/ML IJ SOLN
50.0000 mg | Freq: Once | INTRAMUSCULAR | Status: AC
  Administered 2021-11-13: 50 mg via INTRAVENOUS
  Filled 2021-11-13: qty 1

## 2021-11-13 MED ORDER — FAMOTIDINE IN NACL 20-0.9 MG/50ML-% IV SOLN
20.0000 mg | Freq: Once | INTRAVENOUS | Status: AC
  Administered 2021-11-13: 20 mg via INTRAVENOUS
  Filled 2021-11-13: qty 50

## 2021-11-13 MED ORDER — SODIUM CHLORIDE 0.9 % IV SOLN
10.0000 mg | Freq: Once | INTRAVENOUS | Status: AC
  Administered 2021-11-13: 10 mg via INTRAVENOUS
  Filled 2021-11-13: qty 10

## 2021-11-13 MED ORDER — SODIUM CHLORIDE 0.9 % IV SOLN
111.0000 mg | Freq: Once | INTRAVENOUS | Status: AC
  Administered 2021-11-13: 110 mg via INTRAVENOUS
  Filled 2021-11-13: qty 11

## 2021-11-13 MED ORDER — SODIUM CHLORIDE 0.9% FLUSH
10.0000 mL | Freq: Once | INTRAVENOUS | Status: AC
  Administered 2021-11-13: 10 mL

## 2021-11-13 MED ORDER — SODIUM CHLORIDE 0.9 % IV SOLN: Freq: Once | INTRAVENOUS | Status: AC

## 2021-11-13 MED ORDER — SODIUM CHLORIDE 0.9% FLUSH
10.0000 mL | INTRAVENOUS | Status: DC | PRN
  Administered 2021-11-13: 10 mL

## 2021-11-13 MED ORDER — SODIUM CHLORIDE 0.9 % IV SOLN
50.0000 mg/m2 | Freq: Once | INTRAVENOUS | Status: AC
  Administered 2021-11-13: 78 mg via INTRAVENOUS
  Filled 2021-11-13: qty 13

## 2021-11-13 NOTE — Progress Notes (Signed)
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 SEE NOTE   04/01/2019 Surgery   Pre-Op Dx:   right supraglottic carcinoma   Post-op Dx: T2 N0 M0 (stage II) right supraglottic carcinoma   Proc: Direct laryngoscopy with biopsy.  Cervical esophagoscopy.  Bronchoscopy.   Surg:  [name]    Findings: A bulky necrotic and semi-pedunculated tumor coming off the lateral surface of the right aryepiglottic fold and the anterior and lateral aspect of the piriform sinus on the right side.  Airway was compromised by the tumor mass at intubation.   04/11/2019 Imaging   CT neck: IMPRESSION: The glottis is closed with suboptimal evaluation of the vocal cords. No asymmetry of the cords. Correlate with laryngoscopy results.   No enlarged lymph nodes in the neck. 7 mm right level 2 lymph node may be reactive. No definite pathologic lymph nodes in the neck.   04/11/2019 Imaging   CT neck  This CT was performed after surgical debulking of an exophytic laryngeal tumor.   Most apparent on series 2, image 51 there is abnormal asymmetric  hyperenhancement along the surface of the right piriform sinus, and perhaps also involving the bilateral AE folds.   04/22/2019 Imaging   PET: IMPRESSION: 1. Mild residual activity in the posterior RIGHT hypopharynx confined to the mucosa. 2. No evidence of hypermetabolic metastatic lymph nodes in LEFT or RIGHT neck. 3. No evidence distant metastatic disease.   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 New FDG avid right level 2 cervical lymph node concerning for recurrent disease. No findings of distant metastatic disease within the chest, abdomen or pelvis. 2. Mild nonspecific increased uptake in the right side of larynx is similar to previous exam. 3. Aortic Atherosclerosis (ICD10-I70.0). Coronary artery calcifications.    11/21/2019 Pathology Results   SURGICAL PATHOLOGY  CASE: [id number]  [name]  Surgical Pathology Report   FINAL MICROSCOPIC DIAGNOSIS:   A. LYMPH NODE, RIGHT CERVICAL:  - Poorly differentiated carcinoma consistent with metastatic squamous cell carcinoma.  - See comment   01/06/2020 Pathology Results   SURGICAL PATHOLOGY  CASE: [id number]  [name]  Surgical Pathology Report  Clinical History: metastatic cancer to cervical lymph nodes (cm)   FINAL MICROSCOPIC DIAGNOSIS:   A. LYMPH NODE, RIGHT MODIFIED, DISSECTION:  - Metastatic squamous cell carcinoma in four of nineteen lymph nodes (4/19).  - Largest metastatic nodule is 3.1 cm with 5 mm  extra-nodal extension.  - Lymphovascular space involvement in peri-nodal connective tissue.  - See comment.    01/06/2020 Surgery   PRE-OPERATIVE DIAGNOSIS:  METASTATIC CANCER TO CERVICAL LYMPH NODES   POST-OPERATIVE DIAGNOSIS:  METASTATIC CANCER TO CERVICAL LYMPH NODES   PROCEDURE:  Procedure(s): DIRECT LARYNGOSCOPY MODIFIED RADICAL NECK DISSECTION   SURGEON:  [name], MD     SPECIMEN: Right modified neck dissection including levels 2 and 3, including the internal jugular vein, suture marks the inferior jugular vein stump.   07/05/2020 Imaging   1. Sequelae of radiation and right neck dissection from the previous contrasted Neck CT.   2. Asymmetric soft tissue thickening and enhancement along the right lateral pharynx. Although this might be asymmetric mucositis, the appearance is suspicious and recommend direct inspection. NI-RADS category 2a.   3. Suspicious small 6 mm hyperenhancing nodular soft tissue along the posterior margin of the neck dissection at right level 3. NI-RADS category 2 vs 3 - although this is likely too small for imaging guided biopsy. Repeat PET-CT may be most valuable.   4. Mild inflammatory appearing right upper lobe centrilobular ground-glass opacity, new since February. Consider mild or developing right upper lobe infection. Post radiation changes to the lung apices.       08/13/2020 Pathology Results   A. OROPHARNYX, BIOPSY:  - Poorly differentiated squamous cell carcinoma.  - See comment.    08/13/2020 Surgery   POST-OPERATIVE DIAGNOSIS:  Tonsillary Mass History of Laryngeal  Cancer   PROCEDURE:  Procedure(s): DIRECT LARYNGOSCOPY BIOPSY OF OROPHARYNGEAL MASS   08/30/2020 PET scan   1. Recurrence of head neck carcinoma with a broad hypermetabolic pharyngeal mucosal lesion involving the RIGHT lateral posterior oropharynx and hypopharynx extending from the palatine tonsil to the vallecula. 2. Hypermetabolic lymph node posterior  sternocleidomastoid muscle on the RIGHT (level 3). 3. Hypermetabolic nodule within the LEFT parotid glands favored small primary parotid neoplasm. 4. No evidence thoracic metastasis   09/24/2020 - 10/30/2020 Chemotherapy   He received weekly cisplatin   01/31/2021 PET scan   Findings of worsening nodal disease with a node or group of nodes in the LEFT neck and a RIGHT supraclavicular lymph node as discussed.   Post treatment changes with soft tissue fullness in the area of the prior tumor but with markedly diminished metabolic activity.     03/11/2021 Imaging   Ct neck  Diffuse pharyngeal and laryngeal mucosal and submucosal edema probably related to acute radiation injury. The airway is narrow and could possibly be critically narrowed. I do not see any evidence of recurrent mucosal or submucosal tumor identifiable.   Similar appearance of the recurrent malignant lymphadenopathy on the left at level 2 and level 3 and on the right in the low supraclavicular region just above and behind the clavicular head.   04/12/2021 Imaging   Ct abdomen and pelvis  1. Contrast material in the peritoneal cavity of the upper abdomen suggesting leak along the gastrostomy tract. 2. Gastrostomy tube is appropriately positioned. No evidence of abscess. 3. 1.3 cm right middle lobe nodule, new since 01/31/2021. Favor infectious/inflammatory over primary/secondary neoplasm given the relatively short time frame of development.   04/12/2021 Surgery   Postoperative Diagnosis: Perforated duodenal ulcer (1st  portion)   Surgical Procedure:  Modified Graham patch of duodenal ulcer Gastrostomy tube exchange   Operative Team Members:  Surgeon(s) and Role:    * Stechschulte, Nickola Major, MD - Primary     Drains:  (19 Fr) Jackson-Pratt drain(s) with closed bulb suction in the right upper quadrant near the duodenal ulcer repair and Gastrostomy Tube 26 fr   05/09/2021 Imaging   1. Progression of lesion in the right tonsil  with central fluid collection and peripheral enhancement. This was a site of prior recurrent sick could represent progressive tumor or abscess. Continued follow-up recommended. 2. Progressive malignant lymphadenopathy in the left neck. Stable lymphadenopathy right supraclavicular region. 3. New area of stranding in the superior segment left lower lobe but certain etiology. Attention on follow-up recommended.     06/13/2021 -  Chemotherapy   Patient is on Treatment Plan :  Paclitaxel + Carboplatin q7d     06/13/2021 -  Chemotherapy   Patient is on Treatment Plan : HEAD/NECK Pembrolizumab Q21D     07/18/2021 Imaging   Ct chest 1. No highly specific findings identified to suggest pulmonary or nodal metastasis within the chest 2. Multifocal patchy areas of ground-glass and nodular airspace densities. Surrounding postinflammatory changes are noted with thickening of the peribronchovascular interstitium. Imaging findings are concerning for sequelae of aspiration and/or multifocal infection. Follow-up imaging after appropriate antibiotic therapy is recommended with repeat CT of the chest in 3 months to ensure resolution and to rule out the possibility of underlying metastatic nodule. 3. Coronary artery calcifications. 4. Aortic Atherosclerosis (ICD10-I70.0) and Emphysema (ICD10-J43.9).   07/18/2021 Imaging   CT neck  1. Positive treatment response with decreased size of nodal metastases in the left jugular chain. There is likely residual active tumor in the treated nodes. 2. Ulcerated lesions in the bilateral palatine fossa, progressed on the left and suspicious for synchronous tumors.   07/23/2021 Cancer Staging   Staging form: Larynx - Supraglottis, AJCC 8th Edition - Clinical stage from 07/23/2021: Stage IVA (rcT2, cN2b, cM0) - Signed by [name], MD on 07/23/2021 Stage prefix: Recurrence    09/19/2021 Imaging   IMPRESSION: 1. Positive treatment response at the cervical lymph nodes.  No residual nodal disease identified currently. 2. Persisting bilateral palatine tonsil lesion with mild size increase on the right.     09/19/2021 Imaging   IMPRESSION: No new significant lymphadenopathy seen in the mediastinum.   There is interval increase in infiltrates at multiple sites in both lungs as described in the body of the report. There is interval clearing of small patchy infiltrates in right lower lobe. Findings suggest possible waxing and waning multifocal pneumonia.   There are few subcentimeter nodules as described in the body of the report which may be part of pneumonia or neoplastic process. Short-term follow-up CT in 3 months may be considered. Coronary artery calcifications are seen. There is ectasia of ascending thoracic aorta.     CURRENT THERAPY: taxol/carbo/keytruda  INTERVAL HISTORY:  [name] 71 y.o. male returns for evaluation prior to receiving Taxol [place] and [place].  Mr. Busler is here for planned infusion.   His pain in the neck region is better since he started back on the medication. His weight is stable. He says his breathing is pretty good. He has been using 2/3 katefarms in his G tube. No diarrhea, no dysuria. He feels ok to proceed with chemotherapy Rest of the pertinent 10 point ROS reviewed and negative.  Patient Active Problem List  Diagnosis Date Noted   Encounter for antineoplastic immunotherapy 10/11/2021   Pneumonitis 09/23/2021   Hypomagnesemia 08/05/2021   Dysphagia 07/24/2021   Neuropathy due to chemotherapeutic drug ([place]) 07/04/2021   Weight loss, unintentional 06/20/2021   Protein-calorie malnutrition, severe 04/17/2021   Perforated gastric ulcer ([place])    Gastrostomy tube dysfunction ([place]) 04/12/2021   Anemia due to antineoplastic chemotherapy 03/26/2021   Cancer related pain 03/26/2021   Laryngeal edema 03/11/2021   Nausea and vomiting 01/24/2021   Shortness of breath 01/10/2021   Mucositis due to  antineoplastic therapy 01/10/2021   G tube feedings ([place]) 01/10/2021   Chemotherapy induced diarrhea 10/30/2020   Port-A-Cath in place 10/23/2020   Chronic hepatitis C without hepatic coma ([place]) 06/09/2019   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  MEDICAL HISTORY: Past Medical History:  Diagnosis Date   Cancer ([place])    Throat cancer 2019   ETOH abuse    Frequent urination    Glaucoma    Hepatitis C virus infection without hepatic coma    dx'ed in 11/2018   History of radiation therapy 05/12/19- 07/06/19   Larynx   Hypertension    Wears denture    upper only; lost lower denture    SURGICAL HISTORY: Past Surgical History:  Procedure Laterality Date   ANKLE SURGERY  2011   right ankle   COLONOSCOPY  02/2019   polyps - Dr Havery Moros   DIRECT LARYNGOSCOPY N/A 04/01/2019   Procedure: DIRECT LARYNGOSCOPY WITH BIOPSY;  Surgeon: Jodi Marble, MD;  Location: [place];  Service: ENT;  Laterality: N/A;   DIRECT LARYNGOSCOPY N/A 01/06/2020   Procedure: DIRECT LARYNGOSCOPY;  Surgeon: Izora Gala, MD;  Location: [place];  Service: ENT;  Laterality: N/A;   DIRECT LARYNGOSCOPY N/A 08/13/2020   Procedure: DIRECT LARYNGOSCOPY;  Surgeon: Izora Gala, MD;  Location: [facility];  Service: ENT;  Laterality: N/A;   ESOPHAGOSCOPY N/A 04/01/2019   Procedure: ESOPHAGOSCOPY;  Surgeon: Jodi Marble, MD;  Location: [facility];  Service: ENT;  Laterality: N/A;   EXCISION ORAL TUMOR Right 08/13/2020   Procedure: BIOPSY OF OROPHARYNGEAL MASS;  Surgeon: Izora Gala, MD;  Location: [facility];  Service: ENT;  Laterality: Right;   EYE SURGERY Right    IR CM INJ ANY COLONIC TUBE W/FLUORO  12/20/2020   IR CM INJ ANY COLONIC TUBE W/FLUORO  03/20/2021   IR GASTROSTOMY TUBE MOD SED  09/24/2020   IR IMAGING GUIDED PORT INSERTION  09/24/2020   IR RADIOLOGIST EVAL & MGMT  07/02/2021   KNEE SURGERY     LAPAROTOMY N/A 04/12/2021    Procedure: EXPLORATORY LAPAROTOMY, REPAIR OF DUODENAL ULCER WITH Silvestre Gunner;  Surgeon: Felicie Morn, MD;  Location: [facility];  Service: General;  Laterality: N/A;   LARYNGOSCOPY AND BRONCHOSCOPY N/A 04/01/2019   Procedure: BRONCHOSCOPY;  Surgeon: Jodi Marble, MD;  Location: [place] [facility];  Service: ENT;  Laterality: N/A;   RADICAL NECK DISSECTION N/A 01/06/2020   Procedure: RADICAL NECK DISSECTION;  Surgeon: Izora Gala, MD;  [facility];  Service: ENT;  Laterality: N/A;    SOCIAL HISTORY: Social History   Socioeconomic History   Marital status: Single    Spouse name: Not on file   Number of children: 2   Years of education: Not on file   Highest education level: Not on file  Occupational History   Not on file  Tobacco Use   Smoking status: Former  Years: 50.00    Types: Cigarettes    Quit date: 06/01/2020    Years since quitting: 1.4   Smokeless tobacco: Never  Vaping Use   Vaping Use: Never used  Substance and Sexual Activity   Alcohol use: Yes    Alcohol/week: 4.0 standard drinks    Types: 4 Cans of beer per week    Comment: 40oz beer daily   Drug use: Yes    Types: Cocaine    Comment: none in 2 yrs   Sexual activity: Yes    Partners: Female  Other Topics Concern   Not on file  Social History Narrative   Patient is divorced with 2 children.   Patient is currently living with his sister.   Patient with a history of smoking a third of pack of cigarettes daily for 50 years.  Patient currently smoking 2 to 3 cigarettes/day.   Patient has never used smokeless tobacco.   Patient with occasional use of alcohol.   Patient last used cocaine approximately 6 months ago.  Patient denies use of marijuana.   Social Determinants of Health   Financial Resource Strain: Not on file  Food Insecurity: Not on file  Transportation Needs: Not on file  Physical Activity: Not on file  Stress: Not on file  Social Connections: Not on file  Intimate Partner Violence: Not on file     FAMILY HISTORY: Family History  Problem Relation Age of Onset   Breast cancer Sister    Colon cancer Brother 55       ????   Cancer Brother     Review of Systems  Constitutional:  Positive for fatigue. Negative for appetite change, chills, fever and unexpected weight change.  HENT:   Positive for sore throat (improving) and trouble swallowing. Negative for hearing loss and lump/mass.   Eyes:  Negative for eye problems and icterus.  Respiratory:  Negative for chest tightness, cough and shortness of breath.   Cardiovascular:  Negative for chest pain, leg swelling and palpitations.  Gastrointestinal:  Negative for abdominal distention, abdominal pain (Pain around the G-tube improving), constipation, diarrhea, nausea and vomiting.  Endocrine: Negative for hot flashes.  Genitourinary:  Negative for difficulty urinating.   Musculoskeletal:  Negative for arthralgias.  Skin:  Negative for itching and rash.  Neurological:  Negative for dizziness, extremity weakness, headaches and numbness.  Hematological:  Negative for adenopathy. Does not bruise/bleed easily.  Psychiatric/Behavioral:  Negative for depression. The patient is not nervous/anxious.      PHYSICAL EXAMINATION  ECOG PERFORMANCE STATUS: 1 - Symptomatic but completely ambulatory V/S reviewed.  Blood pressure slightly high today likely because of pain  Physical Exam Constitutional:      General: He is in acute distress (He appears to be in mild pain distress).     Appearance: Normal appearance. He is not toxic-appearing.  HENT:     Head: Normocephalic and atraumatic.  Eyes:     General: No scleral icterus. Cardiovascular:     Rate and Rhythm: Normal rate and regular rhythm.     Pulses: Normal pulses.     Heart sounds: Normal heart sounds.  Pulmonary:     Effort: Pulmonary effort is normal.     Breath sounds: Normal breath sounds.  Abdominal:     General: Abdomen is flat. Bowel sounds are normal. There is no  distension.     Palpations: Abdomen is soft.     Tenderness: There is abdominal tenderness (Granulation tissue around G tube). There is  no guarding.     Comments:   No evidence of acute abdomen. No evidence of infection  Musculoskeletal:        General: No swelling.     Cervical back: Neck supple.  Lymphadenopathy:     Cervical: No cervical adenopathy.  Skin:    General: Skin is warm and dry.     Capillary Refill: Capillary refill takes less than 2 seconds.     Findings: No rash.  Neurological:     General: No focal deficit present.     Mental Status: He is alert.  Psychiatric:        Mood and Affect: Mood normal.        Behavior: Behavior normal.    LABORATORY DATA:  CBC    Component Value Date/Time   WBC 3.0 (L) 11/13/2021 1123   RBC 3.09 (L) 11/13/2021 1123   HGB 10.4 (L) 11/13/2021 1123   HGB 10.2 (L) 11/01/2021 1157   HCT 29.7 (L) 11/13/2021 1123   PLT 205 11/13/2021 1123   PLT 222 11/01/2021 1157   MCV 96.1 11/13/2021 1123   MCH 33.7 11/13/2021 1123   MCHC 35.0 11/13/2021 1123   RDW 13.2 11/13/2021 1123   LYMPHSABS 0.7 11/13/2021 1123   MONOABS 0.4 11/13/2021 1123   EOSABS 0.1 11/13/2021 1123   BASOSABS 0.0 11/13/2021 1123    CMP     Component Value Date/Time   NA 134 (L) 11/13/2021 1123   K 3.8 11/13/2021 1123   CL 96 (L) 11/13/2021 1123   CO2 30 11/13/2021 1123   GLUCOSE 96 11/13/2021 1123   BUN 7 (L) 11/13/2021 1123   CREATININE 0.52 (L) 11/13/2021 1123   CREATININE 0.54 (L) 11/01/2021 1157   CREATININE 0.61 (L) 10/26/2019 1523   CALCIUM 9.7 11/13/2021 1123   PROT 7.7 11/13/2021 1123   ALBUMIN 4.1 11/13/2021 1123   AST 13 (L) 11/13/2021 1123   AST 15 11/01/2021 1157   ALT 9 11/13/2021 1123   ALT 9 11/01/2021 1157   ALT 53 (H) 09/06/2019 1543   ALKPHOS 49 11/13/2021 1123   BILITOT 0.4 11/13/2021 1123   BILITOT 0.4 11/01/2021 1157   GFRNONAA >60 11/13/2021 1123   GFRNONAA >60 11/01/2021 1157   GFRAA >60 01/03/2020 1355   GFRAA >60 05/04/2019  1207    ASSESSMENT and THERAPY PLAN:   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 Arns is a patient with complicated history of supraglottic tumor with cancer recurrence in the head and neck region currently on weekly CarboTaxol and every 21-day Keytruda.   We had to hold immunotherapy for cycle 8 because of concern of pneumonitis from immunotherapy versus recovering COVID-19 infection.  However since it was thought to be grade 1, we gave him a challenge of short course of steroids as well as antibiotics for possible atypical pneumonia and repeat imaging showed recovering pneumonitis hence we have discussed about proceeding with immunotherapy. He currently is on weekly CarboTaxol and every 21-day Keytruda.  Chemotherapy held last week because of severe pain and fatigue. He is here for follow-up before.  Week 12 of CarboTaxol and every 21-day Keytruda. No concerning review of systems, pain in the neck as well as around the G-tube is better controlled today.  He is able to put in 2-3 [facility] in his G-tube, weight is stable. No worsening shortness of breath or concern for pneumonitis. CBC reviewed, satisfactory to proceed, rest of labs pending at the time of my visit.  Okay  to proceed with planned chemotherapy if labs are all within parameters. He will need restaging imaging, this has been ordered, sent an in basket message to our nurse navigator team to help schedule.  Encounter for antineoplastic immunotherapy No concern for immunotherapy related toxicity, okay to proceed with [facility] as scheduled today  Weight loss, unintentional Stable since last visit, he has lost 1 pound.  He is able to administer Dillard Essex again in his G-tube.  Encouraged him to at least try 3 a day for now.  All questions were answered. The patient knows to call the clinic with any problems, questions or concerns. We can certainly see the patient much sooner if necessary.  Total encounter time:  30 minutes in face-to-face visit time, chart review, lab review, care coordination, discussion with nutrition, discussion with Dr. Chryl Heck, and documentation of the encounter.  *Total Encounter Time as defined by the Centers for Medicare and Medicaid Services includes, in addition to the face-to-face time of a patient visit (documented in the note above) non-face-to-face time: obtaining and reviewing outside history, ordering and reviewing medications, tests or procedures, care coordination (communications with other health care professionals or caregivers) and documentation in the medical record.

## 2021-11-13 NOTE — Progress Notes (Signed)
Nutrition Follow-up: ? ?Patient receiving CarboTaxol and Keytruda for laryngeal cancer.  ? ?Met with patient during infusion. He is sleeping, noted empty Ensure Plus and full [employer] 1.4 on chairside table. Patient easily awakened and reports giving Ensure Plus via tube instead of [employer]. Patient reports bolus feeding of [employer] prior to visit. Patient reports giving 2-3 cartons of [employer]/day with 1 1/2 bottles of water daily for flushes. He denies nausea, vomiting, diarrhea, constipation.  ? ?Medications: reviewed  ? ?Labs: Na 134 ? ?Anthropometrics: Weight 102 lb 1.6 oz  decreased ? ?2/24 - 103 lb  ?2/17 - 108 lb 8 oz  ?2/10 - 104 lb 12 oz  ?2/3 - 111 lb 12 oz ?1/27 - 107 lb 3.2 oz  ? ? ?NUTRITION DIAGNOSIS: Severe malnutrition continues  ? ? ?INTERVENTION:  ?Recommend increasing [employer] 1.4 to goal to promote weight gain ?[employer] 1.4 - give on carton 4x/day with 120 ml water flush QID + additional 480 ml water/day. Provides 1820 kcal, 80 grams protein, 1890 ml total water ?Educated pt to use [employer] vs Ensure to meet calorie/protein needs ?Informed pt that medical necessity form signed by Dr. Squire and returned. Patient reports he has 2 cases (12 cartons per case) at home. Caregiver picked up 2 cases [employer] left at registration desk on 2/25 ? ? ?MONITORING, EVALUATION, GOAL: weight trends, tube feeding ? ? ?NEXT VISIT: To be scheduled with treatment  ? ? ? ?

## 2021-11-13 NOTE — Assessment & Plan Note (Signed)
No concern for immunotherapy related toxicity, okay to proceed with [facility] as scheduled today ?

## 2021-11-13 NOTE — Assessment & Plan Note (Signed)
Stable since last visit, he has lost 1 pound.  He is able to administer Dillard Essex again in his G-tube.  Encouraged him to at least try 3 a day for now. ?

## 2021-11-13 NOTE — Patient Instructions (Signed)
[facility] MEDICAL ONCOLOGY   Discharge Instructions: Thank you for choosing [facility] to provide your oncology and hematology care.   If you have a lab appointment with the Cancer Center, please go directly to the Cancer Center and check in at the registration area.   Wear comfortable clothing and clothing appropriate for easy access to any Portacath or PICC line.   We strive to give you quality time with your provider. You may need to reschedule your appointment if you arrive late (15 or more minutes).  Arriving late affects you and other patients whose appointments are after yours.  Also, if you miss three or more appointments without notifying the office, you may be dismissed from the clinic at the provider's discretion.      For prescription refill requests, have your pharmacy contact our office and allow 72 hours for refills to be completed.    Today you received the following chemotherapy and/or immunotherapy agents: Pembrolizumab (Keytruda), Paclitaxel (Taxol), and Carboplatin      To help prevent nausea and vomiting after your treatment, we encourage you to take your nausea medication as directed.  BELOW ARE SYMPTOMS THAT SHOULD BE REPORTED IMMEDIATELY: *FEVER GREATER THAN 100.4 F (38 C) OR HIGHER *CHILLS OR SWEATING *NAUSEA AND VOMITING THAT IS NOT CONTROLLED WITH YOUR NAUSEA MEDICATION *UNUSUAL SHORTNESS OF BREATH *UNUSUAL BRUISING OR BLEEDING *URINARY PROBLEMS (pain or burning when urinating, or frequent urination) *BOWEL PROBLEMS (unusual diarrhea, constipation, pain near the anus) TENDERNESS IN MOUTH AND THROAT WITH OR WITHOUT PRESENCE OF ULCERS (sore throat, sores in mouth, or a toothache) UNUSUAL RASH, SWELLING OR PAIN  UNUSUAL VAGINAL DISCHARGE OR ITCHING   Items with * indicate a potential emergency and should be followed up as soon as possible or go to the Emergency Department if any problems should occur.  Please show the CHEMOTHERAPY  ALERT CARD or IMMUNOTHERAPY ALERT CARD at check-in to the Emergency Department and triage nurse.  Should you have questions after your visit or need to cancel or reschedule your appointment, please contact [facility]  Dept: [phone]  and follow the prompts.  Office hours are 8:00 a.m. to 4:30 p.m. Monday - Friday. Please note that voicemails left after 4:00 p.m. may not be returned until the following business day.  We are closed weekends and major holidays. You have access to a nurse at all times for urgent questions. Please call the main number to the clinic Dept: [phone] and follow the prompts.   For any non-urgent questions, you may also contact your provider using MyChart. We now offer e-Visits for anyone 18 and older to request care online for non-urgent symptoms. For details visit [email or web address].   Also download the MyChart app! Go to the app store, search "MyChart", open the app, select [facility], and log in with your MyChart username and password.  Due to Covid, a mask is required upon entering the hospital/clinic. If you do not have a mask, one will be given to you upon arrival. For doctor visits, patients may have 1 support person aged 18 or older with them. For treatment visits, patients cannot have anyone with them due to current Covid guidelines and our immunocompromised population.   

## 2021-11-13 NOTE — Progress Notes (Signed)
Per Dr. Chryl Heck - okay to proceed with elevated BP today.  ?

## 2021-11-13 NOTE — Assessment & Plan Note (Addendum)
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   ?We had to hold immunotherapy for cycle 8 because of concern of pneumonitis from immunotherapy versus recovering COVID-19 infection.  However since it was thought to be grade 1, we gave him a challenge of short course of steroids as well as antibiotics for possible atypical pneumonia and repeat imaging showed recovering pneumonitis hence we have discussed about proceeding with immunotherapy. ?He currently is on weekly CarboTaxol and every 21-day Keytruda.  ?Chemotherapy held last week because of severe pain and fatigue. ?He is here for follow-up before.  Week 12 of CarboTaxol and every 21-day Keytruda. ?No concerning review of systems, pain in the neck as well as around the G-tube is better controlled today.  He is able to put in 2-3 [facility] in his G-tube, weight is stable. ?No worsening shortness of breath or concern for pneumonitis. ?CBC reviewed, satisfactory to proceed, rest of labs pending at the time of my visit.  Okay to proceed with planned chemotherapy if labs are all within parameters. ?He will need restaging imaging, this has been ordered, sent an in basket message to our nurse navigator team to help schedule. ?

## 2021-11-15 ENCOUNTER — Inpatient Hospital Stay: Payer: Medicare HMO | Admitting: Hematology and Oncology

## 2021-11-26 ENCOUNTER — Other Ambulatory Visit: Payer: Self-pay

## 2021-11-26 ENCOUNTER — Ambulatory Visit (HOSPITAL_COMMUNITY)
Admission: RE | Admit: 2021-11-26 | Discharge: 2021-11-26 | Disposition: A | Discharge status: Home / Self Care | Payer: Medicare HMO | Source: Ambulatory Visit | Attending: Hematology and Oncology | Admitting: Hematology and Oncology

## 2021-11-26 DIAGNOSIS — C321 Malignant neoplasm of supraglottis: Secondary | ICD-10-CM | POA: Insufficient documentation

## 2021-11-26 MED ORDER — SODIUM CHLORIDE (PF) 0.9 % IJ SOLN
INTRAMUSCULAR | Status: AC
  Filled 2021-11-26: qty 50

## 2021-11-26 MED ORDER — IOHEXOL 300 MG/ML  SOLN
100.0000 mL | Freq: Once | INTRAMUSCULAR | Status: AC | PRN
  Administered 2021-11-26: 100 mL via INTRAVENOUS

## 2021-12-03 ENCOUNTER — Inpatient Hospital Stay: Payer: Medicare HMO

## 2021-12-03 ENCOUNTER — Other Ambulatory Visit: Payer: Self-pay

## 2021-12-03 ENCOUNTER — Inpatient Hospital Stay (HOSPITAL_BASED_OUTPATIENT_CLINIC_OR_DEPARTMENT_OTHER): Payer: Medicare HMO | Admitting: Hematology and Oncology

## 2021-12-03 ENCOUNTER — Encounter: Payer: Self-pay | Admitting: Hematology and Oncology

## 2021-12-03 DIAGNOSIS — C321 Malignant neoplasm of supraglottis: Secondary | ICD-10-CM | POA: Diagnosis not present

## 2021-12-03 DIAGNOSIS — Z5111 Encounter for antineoplastic chemotherapy: Secondary | ICD-10-CM | POA: Diagnosis not present

## 2021-12-03 NOTE — Progress Notes (Addendum)
[facility]: ?  ? ?Alan Ebbs, MD ?[address] ?[place] [address] ? ? 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 ? ?SUMMARY OF ONCOLOGIC HISTORY: ?Oncology History Overview Note  ?Recurrent SCC, p16 neg ?  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 Direct laryngoscopy w/ debulking of the tumor arising from the lateral surface of the right aryepiglottic fold and anterior/lateral aspect of the piriform sinus on the right side  ?  ?04/01/2019 Pathology Results  ? Accession: [id number] ? ?Larynx, biopsy, Right Supraglottic Tumor ?- POORLY DIFFERENTIATED SQUAMOUS CELL CARCINOMA WITH FOCAL SARCOMATOID CHANGES. SEE NOTE ?  ?04/01/2019 Surgery  ? Pre-Op Dx:   right supraglottic carcinoma ?  ?Post-op Dx: T2 N0 M0 (stage II) right supraglottic carcinoma ?  ?Proc: Direct laryngoscopy with biopsy.  Cervical esophagoscopy.  Bronchoscopy. ?  ?Surg:  [name] ?   ?Findings: A bulky necrotic and semi-pedunculated tumor coming off the lateral surface of the right aryepiglottic fold and the anterior and lateral aspect of the piriform sinus on the right side.  Airway was compromised by the tumor mass at intubation. ?  ?04/11/2019 Imaging  ? CT neck: ?IMPRESSION: ?The glottis is closed with suboptimal evaluation of the vocal cords. ?No asymmetry of the cords. Correlate with laryngoscopy results. ?  ?No enlarged lymph nodes in the neck. 7 mm right level 2 lymph node ?may be reactive. No definite pathologic lymph nodes in the neck. ?  ?04/11/2019 Imaging  ? CT neck ? ?This CT was performed after surgical debulking of an exophytic laryngeal tumor. ?  ?Most apparent on series 2, image 51 there is abnormal asymmetric  hyperenhancement along the surface of the right piriform sinus, and perhaps also involving the bilateral AE folds. ?  ?04/22/2019 Imaging  ? PET: ?IMPRESSION: ?1. Mild residual activity in the posterior RIGHT hypopharynx ?confined to the mucosa. ?2. No evidence of hypermetabolic metastatic lymph nodes in LEFT or ?RIGHT neck. ?3. No evidence distant metastatic disease. ?  ?05/02/2019 Initial Diagnosis  ? 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  ?05/12/2019 - 07/06/2019 Radiation Therapy  ? Radiation Treatment Dates: 05/12/2019 through 07/06/2019 ?Site Technique Total Dose (Gy) Dose per Fx (Gy) Completed Fx Beam Energies  ?Head & neck: HN_larynx IMRT 70/70 2 35/35 6X  ?  ? ?Radiation Treatment Dates: 09/18/2020 through 10/31/2020 ?Site Technique Total Dose (Gy) Dose per Fx (Gy) Completed Fx Beam Energies  ?Oropharynx: HN_orophar IMRT 60/60 2 30/30 6X  ?  ?  ?  ?10/27/2019 PET scan  ? 1. New FDG avid right level 2 cervical lymph node concerning for recurrent disease. No findings of distant metastatic disease within the chest, abdomen or pelvis. ?2. Mild nonspecific increased uptake in the right side of larynx is similar to previous exam. ?3. Aortic Atherosclerosis (ICD10-I70.0). Coronary artery calcifications.  ?  ?11/21/2019 Pathology Results  ? SURGICAL PATHOLOGY  ?CASE: [id number]  ?[name]  ?Surgical Pathology Report  ? ?FINAL MICROSCOPIC DIAGNOSIS:  ? ?A. LYMPH NODE, RIGHT CERVICAL:  ?- Poorly differentiated carcinoma consistent with metastatic squamous cell carcinoma.  ?- See comment ?  ?01/06/2020 Pathology Results  ? SURGICAL PATHOLOGY  ?CASE: [id number]  ?[name]  ?Surgical Pathology Report  ? ?  Clinical History: metastatic cancer to cervical lymph nodes (cm)  ? ?FINAL MICROSCOPIC DIAGNOSIS:  ? ?A. LYMPH NODE, RIGHT MODIFIED, DISSECTION:  ?- Metastatic squamous cell carcinoma in four of nineteen lymph nodes (4/19).  ?- Largest metastatic nodule is 3.1 cm with 5 mm  extra-nodal extension.  ?- Lymphovascular space involvement in peri-nodal connective tissue.  ?- See comment.  ?  ?01/06/2020 Surgery  ? PRE-OPERATIVE DIAGNOSIS:  METASTATIC CANCER TO CERVICAL LYMPH NODES ?  ?POST-OPERATIVE DIAGNOSIS:  METASTATIC CANCER TO CERVICAL LYMPH NODES ?  ?PROCEDURE:  Procedure(s): ?DIRECT LARYNGOSCOPY ?MODIFIED RADICAL NECK DISSECTION ?  ?SURGEON:  [name], MD ?    ?SPECIMEN: Right modified neck dissection including levels 2 and 3, including the internal jugular vein, suture marks the inferior jugular vein stump. ?  ?07/05/2020 Imaging  ? 1. Sequelae of radiation and right neck dissection from the previous contrasted Neck CT. ?  ?2. Asymmetric soft tissue thickening and enhancement along the right lateral pharynx. Although this might be asymmetric mucositis, the appearance is suspicious and recommend direct inspection. NI-RADS category 2a. ?  ?3. Suspicious small 6 mm hyperenhancing nodular soft tissue along the posterior margin of the neck dissection at right level 3. NI-RADS category 2 vs 3 - although this is likely too small for imaging guided biopsy. Repeat PET-CT may be most valuable. ?  ?4. Mild inflammatory appearing right upper lobe centrilobular ground-glass opacity, new since February. Consider mild or developing right upper lobe infection. Post radiation changes to the lung apices. ?  ?  ?  ?08/13/2020 Pathology Results  ? A. OROPHARNYX, BIOPSY:  ?- Poorly differentiated squamous cell carcinoma.  ?- See comment.  ?  ?08/13/2020 Surgery  ? POST-OPERATIVE DIAGNOSIS:  Tonsillary Mass History of Laryngeal  Cancer ?  ?PROCEDURE:  Procedure(s): ?DIRECT LARYNGOSCOPY ?BIOPSY OF OROPHARYNGEAL MASS ?  ?08/30/2020 PET scan  ? 1. Recurrence of head neck carcinoma with a broad hypermetabolic pharyngeal mucosal lesion involving the RIGHT lateral posterior oropharynx and hypopharynx extending from the palatine tonsil to the vallecula. ?2. Hypermetabolic lymph node posterior  sternocleidomastoid muscle on the RIGHT (level 3). ?3. Hypermetabolic nodule within the LEFT parotid glands favored small primary parotid neoplasm. ?4. No evidence thoracic metastasis ?  ?09/24/2020 - 10/30/2020 Chemotherapy  ? He received weekly cisplatin ?  ?01/31/2021 PET scan  ? Findings of worsening nodal disease with a node or group of nodes in the LEFT neck and a RIGHT supraclavicular lymph node as discussed. ?  ?Post treatment changes with soft tissue fullness in the area of the prior tumor but with markedly diminished metabolic activity. ?  ?  ?03/11/2021 Imaging  ? Ct neck ? ?Diffuse pharyngeal and laryngeal mucosal and submucosal edema probably related to acute radiation injury. The airway is narrow and could possibly be critically narrowed. I do not see any evidence of recurrent mucosal or submucosal tumor identifiable. ?  ?Similar appearance of the recurrent malignant lymphadenopathy on the left at level 2 and level 3 and on the right in the low supraclavicular region just above and behind the clavicular head. ?  ?04/12/2021 Imaging  ? Ct abdomen and pelvis ? ?1. Contrast material in the peritoneal cavity of the upper abdomen suggesting leak along the gastrostomy tract. ?2. Gastrostomy tube is appropriately positioned. No evidence of abscess. ?3. 1.3 cm right middle lobe nodule, new since 01/31/2021. Favor infectious/inflammatory over primary/secondary neoplasm given the relatively short time frame of development. ?  ?04/12/2021 Surgery  ? Postoperative Diagnosis: Perforated duodenal ulcer (1st  portion) ?  ?Surgical Procedure:  ?Modified Graham patch of duodenal ulcer ?Gastrostomy tube exchange ?  ?Operative Team Members:  ?Surgeon(s) and Role: ?   * Stechschulte, Nickola Major, MD - Primary  ?   ?Drains:  (19 Fr) Jackson-Pratt drain(s) with closed bulb suction in the right upper quadrant near the duodenal ulcer repair and Gastrostomy Tube 26 fr ?  ?05/09/2021 Imaging  ? 1. Progression of lesion in the right tonsil  with central fluid collection and peripheral enhancement. This was a site of prior recurrent sick could represent progressive tumor or abscess. Continued follow-up recommended. ?2. Progressive malignant lymphadenopathy in the le

## 2021-12-03 NOTE — Assessment & Plan Note (Addendum)
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   ?We had to hold immunotherapy for cycle 8 because of concern of pneumonitis from immunotherapy versus recovering COVID-19 infection.  However since it was thought to be grade 1, we gave him a challenge of short course of steroids as well as antibiotics for possible atypical pneumonia and repeat imaging showed recovering pneumonitis hence we have discussed about proceeding with immunotherapy. ?He completed 12 weekly cycles of carbotaxol and every 21 day keytruda ( 6 cycles) ?Most recent imaging with increasing bilateral palatine tonsil lesions and cervical lymphadenopathy ?CT chest without any concern for progression in the chest, groundglass opacities appear to be resolving consistent with infection/inflammation ?We will present him in the ENT tumor board next Wednesday, I have also encouraged him to see [name] at [facility] for other treatment options.  He has had prior treatments with cisplatin, docetaxel and CarboTaxol, Keytruda. ? ?He should return to see [name] at [facility] to see if there are any clinical trials available.  Other systemic therapy options could be cis gem combination versus cis 5-FU at a lower dose with immunotherapy ? ?I conveyed the message to [name] and she was instructed to call [facility] as recommended. ?He will return to clinic to see me in 2 weeks ? ? ?

## 2021-12-17 ENCOUNTER — Encounter: Payer: Self-pay | Admitting: Hematology and Oncology

## 2021-12-17 ENCOUNTER — Other Ambulatory Visit: Payer: Self-pay

## 2021-12-17 ENCOUNTER — Inpatient Hospital Stay: Payer: Medicare HMO | Attending: Hematology and Oncology | Admitting: Hematology and Oncology

## 2021-12-17 DIAGNOSIS — Z923 Personal history of irradiation: Secondary | ICD-10-CM | POA: Insufficient documentation

## 2021-12-17 DIAGNOSIS — Z931 Gastrostomy status: Secondary | ICD-10-CM

## 2021-12-17 DIAGNOSIS — Z5111 Encounter for antineoplastic chemotherapy: Secondary | ICD-10-CM | POA: Insufficient documentation

## 2021-12-17 DIAGNOSIS — G893 Neoplasm related pain (acute) (chronic): Secondary | ICD-10-CM | POA: Diagnosis not present

## 2021-12-17 DIAGNOSIS — C321 Malignant neoplasm of supraglottis: Secondary | ICD-10-CM | POA: Diagnosis present

## 2021-12-17 DIAGNOSIS — Z79899 Other long term (current) drug therapy: Secondary | ICD-10-CM | POA: Diagnosis not present

## 2021-12-17 DIAGNOSIS — Z7189 Other specified counseling: Secondary | ICD-10-CM | POA: Diagnosis not present

## 2021-12-17 NOTE — Progress Notes (Signed)
DISCONTINUE OFF PATHWAY REGIMEN - Head and Neck ? ? ?Custom Intervention:Medical: [Paclitaxel, Carboplatin]: ?    Paclitaxel  ?    Carboplatin  ? ?**Always confirm dose/schedule in your pharmacy ordering system** ? ?REASON: Disease Progression ?PRIOR TREATMENT: Off Pathway: Medical: [Paclitaxel, Carboplatin] ?TREATMENT RESPONSE: Progressive Disease (PD) ? ?START ON PATHWAY REGIMEN - Head and Neck ? ? ?  Cycle 1: A cycle is 7 days: ?    Cetuximab  ?  Cycles 2 and beyond: A cycle is every 7 days: ?    Cetuximab  ? ?**Always confirm dose/schedule in your pharmacy ordering system** ? ?Patient Characteristics: ?Larynx, Local Recurrence, Not a Candidate for Radiation Therapy, Prior Platinum-Based Chemoradiation within 6 Months, Not a Candidate for Checkpoint Inhibitor ?Disease Classification: Larynx ?AJCC T Category: T2 ?AJCC 8 Stage Grouping: Unknown ?Therapeutic Status: Local Recurrence ?AJCC N Category: NX ?AJCC M Category: M0 ?Is patient a candidate for radiation therapy<= No ?Prior Platinum Status: Prior Platinum-Based Chemoradiation within 6 Months ?Checkpoint Inhibitor Candidacy: Not a Candidate for Checkpoint Inhibitor ?Intent of Therapy: ?Non-Curative / Palliative Intent, Discussed with Patient ?

## 2021-12-17 NOTE — Assessment & Plan Note (Signed)
We had a detailed discussion about goals of care today.  He understands that the intent of treatment is palliative and if his PPS was to deteriorate, he may have to proceed with hospice and comfort care.  We have also discussed about DNR today.  He clearly agreed to be DNR in case of cardiorespiratory arrest.  Ms. Verdene Lennert was also present at the time of this discussion. ?

## 2021-12-17 NOTE — Assessment & Plan Note (Signed)
He continues to lose weight, lost about 8 pounds since last visit.  He reports using 6 cartons of [employer] a day. ?

## 2021-12-17 NOTE — Assessment & Plan Note (Signed)
He continues to take oxycodone about 3-4 times a day for pain control.  No additional pain needs ?

## 2021-12-17 NOTE — Progress Notes (Signed)
[facility] Follow up: ?  ? ?Alan Ebbs, MD ?[address] ?[place] [address] ? ? 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 ? ?SUMMARY OF ONCOLOGIC HISTORY: ?Oncology History Overview Note  ?Recurrent SCC, p16 neg ?  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 Direct laryngoscopy w/ debulking of the tumor arising from the lateral surface of the right aryepiglottic fold and anterior/lateral aspect of the piriform sinus on the right side  ?  ?04/01/2019 Pathology Results  ? Accession: [id number] ? ?Larynx, biopsy, Right Supraglottic Tumor ?- POORLY DIFFERENTIATED SQUAMOUS CELL CARCINOMA WITH FOCAL SARCOMATOID CHANGES. SEE NOTE ?  ?04/01/2019 Surgery  ? Pre-Op Dx:   right supraglottic carcinoma ?  ?Post-op Dx: T2 N0 M0 (stage II) right supraglottic carcinoma ?  ?Proc: Direct laryngoscopy with biopsy.  Cervical esophagoscopy.  Bronchoscopy. ?  ?Surg:  [name] ?   ?Findings: A bulky necrotic and semi-pedunculated tumor coming off the lateral surface of the right aryepiglottic fold and the anterior and lateral aspect of the piriform sinus on the right side.  Airway was compromised by the tumor mass at intubation. ?  ?04/11/2019 Imaging  ? CT neck: ?IMPRESSION: ?The glottis is closed with suboptimal evaluation of the vocal cords. ?No asymmetry of the cords. Correlate with laryngoscopy results. ?  ?No enlarged lymph nodes in the neck. 7 mm right level 2 lymph node ?may be reactive. No definite pathologic lymph nodes in the neck. ?  ?04/11/2019 Imaging  ? CT neck ? ?This CT was performed after surgical debulking of an exophytic laryngeal tumor. ?  ?Most apparent on series 2, image 51 there is abnormal asymmetric  hyperenhancement along the surface of the right piriform sinus, and perhaps also involving the bilateral AE folds. ?  ?04/22/2019 Imaging  ? PET: ?IMPRESSION: ?1. Mild residual activity in the posterior RIGHT hypopharynx ?confined to the mucosa. ?2. No evidence of hypermetabolic metastatic lymph nodes in LEFT or ?RIGHT neck. ?3. No evidence distant metastatic disease. ?  ?05/02/2019 Initial Diagnosis  ? 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  ?05/12/2019 - 07/06/2019 Radiation Therapy  ? Radiation Treatment Dates: 05/12/2019 through 07/06/2019 ?Site Technique Total Dose (Gy) Dose per Fx (Gy) Completed Fx Beam Energies  ?Head & neck: HN_larynx IMRT 70/70 2 35/35 6X  ?  ? ?Radiation Treatment Dates: 09/18/2020 through 10/31/2020 ?Site Technique Total Dose (Gy) Dose per Fx (Gy) Completed Fx Beam Energies  ?Oropharynx: HN_orophar IMRT 60/60 2 30/30 6X  ?  ?  ?  ?10/27/2019 PET scan  ? 1. New FDG avid right level 2 cervical lymph node concerning for recurrent disease. No findings of distant metastatic disease within the chest, abdomen or pelvis. ?2. Mild nonspecific increased uptake in the right side of larynx is similar to previous exam. ?3. Aortic Atherosclerosis (ICD10-I70.0). Coronary artery calcifications.  ?  ?11/21/2019 Pathology Results  ? SURGICAL PATHOLOGY  ?CASE: [id number]  ?[name]  ?Surgical Pathology Report  ? ?FINAL MICROSCOPIC DIAGNOSIS:  ? ?A. LYMPH NODE, RIGHT CERVICAL:  ?- Poorly differentiated carcinoma consistent with metastatic squamous cell carcinoma.  ?- See comment ?  ?01/06/2020 Pathology Results  ? SURGICAL PATHOLOGY  ?CASE: [id number]  ?[name]  ?Surgical Pathology Report  ? ?  Clinical History: metastatic cancer to cervical lymph nodes (cm)  ? ?FINAL MICROSCOPIC DIAGNOSIS:  ? ?A. LYMPH NODE, RIGHT MODIFIED, DISSECTION:  ?- Metastatic squamous cell carcinoma in four of nineteen lymph nodes (4/19).  ?- Largest metastatic nodule is 3.1 cm with 5 mm  extra-nodal extension.  ?- Lymphovascular space involvement in peri-nodal connective tissue.  ?- See comment.  ?  ?01/06/2020 Surgery  ? PRE-OPERATIVE DIAGNOSIS:  METASTATIC CANCER TO CERVICAL LYMPH NODES ?  ?POST-OPERATIVE DIAGNOSIS:  METASTATIC CANCER TO CERVICAL LYMPH NODES ?  ?PROCEDURE:  Procedure(s): ?DIRECT LARYNGOSCOPY ?MODIFIED RADICAL NECK DISSECTION ?  ?SURGEON:  [name], MD ?    ?SPECIMEN: Right modified neck dissection including levels 2 and 3, including the internal jugular vein, suture marks the inferior jugular vein stump. ?  ?07/05/2020 Imaging  ? 1. Sequelae of radiation and right neck dissection from the previous contrasted Neck CT. ?  ?2. Asymmetric soft tissue thickening and enhancement along the right lateral pharynx. Although this might be asymmetric mucositis, the appearance is suspicious and recommend direct inspection. NI-RADS category 2a. ?  ?3. Suspicious small 6 mm hyperenhancing nodular soft tissue along the posterior margin of the neck dissection at right level 3. NI-RADS category 2 vs 3 - although this is likely too small for imaging guided biopsy. Repeat PET-CT may be most valuable. ?  ?4. Mild inflammatory appearing right upper lobe centrilobular ground-glass opacity, new since February. Consider mild or developing right upper lobe infection. Post radiation changes to the lung apices. ?  ?  ?  ?08/13/2020 Pathology Results  ? A. OROPHARNYX, BIOPSY:  ?- Poorly differentiated squamous cell carcinoma.  ?- See comment.  ?  ?08/13/2020 Surgery  ? POST-OPERATIVE DIAGNOSIS:  Tonsillary Mass History of Laryngeal  Cancer ?  ?PROCEDURE:  Procedure(s): ?DIRECT LARYNGOSCOPY ?BIOPSY OF OROPHARYNGEAL MASS ?  ?08/30/2020 PET scan  ? 1. Recurrence of head neck carcinoma with a broad hypermetabolic pharyngeal mucosal lesion involving the RIGHT lateral posterior oropharynx and hypopharynx extending from the palatine tonsil to the vallecula. ?2. Hypermetabolic lymph node posterior  sternocleidomastoid muscle on the RIGHT (level 3). ?3. Hypermetabolic nodule within the LEFT parotid glands favored small primary parotid neoplasm. ?4. No evidence thoracic metastasis ?  ?09/24/2020 - 10/30/2020 Chemotherapy  ? He received weekly cisplatin ?  ?01/31/2021 PET scan  ? Findings of worsening nodal disease with a node or group of nodes in the LEFT neck and a RIGHT supraclavicular lymph node as discussed. ?  ?Post treatment changes with soft tissue fullness in the area of the prior tumor but with markedly diminished metabolic activity. ?  ?  ?03/11/2021 Imaging  ? Ct neck ? ?Diffuse pharyngeal and laryngeal mucosal and submucosal edema probably related to acute radiation injury. The airway is narrow and could possibly be critically narrowed. I do not see any evidence of recurrent mucosal or submucosal tumor identifiable. ?  ?Similar appearance of the recurrent malignant lymphadenopathy on the left at level 2 and level 3 and on the right in the low supraclavicular region just above and behind the clavicular head. ?  ?04/12/2021 Imaging  ? Ct abdomen and pelvis ? ?1. Contrast material in the peritoneal cavity of the upper abdomen suggesting leak along the gastrostomy tract. ?2. Gastrostomy tube is appropriately positioned. No evidence of abscess. ?3. 1.3 cm right middle lobe nodule, new since 01/31/2021. Favor infectious/inflammatory over primary/secondary neoplasm given the relatively short time frame of development. ?  ?04/12/2021 Surgery  ? Postoperative Diagnosis: Perforated duodenal ulcer (1st  portion) ?  ?Surgical Procedure:  ?Modified Graham patch of duodenal ulcer ?Gastrostomy tube exchange ?  ?Operative Team Members:  ?Surgeon(s) and Role: ?   * Stechschulte, Nickola Major, MD - Primary  ?   ?Drains:  (19 Fr) Jackson-Pratt drain(s) with closed bulb suction in the right upper quadrant near the duodenal ulcer repair and Gastrostomy Tube 26 fr ?  ?05/09/2021 Imaging  ? 1. Progression of lesion in the right tonsil  with central fluid collection and peripheral enhancement. This was a site of prior recurrent sick could represent progressive tumor or abscess. Continued follow-up recommended. ?2. Progressive malignant lymphadenopathy in the le

## 2021-12-17 NOTE — Assessment & Plan Note (Addendum)
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   ?We had to hold immunotherapy for cycle 8 because of concern of pneumonitis from immunotherapy versus recovering COVID-19 infection.  However since it was thought to be grade 1, we gave him a challenge of short course of steroids as well as antibiotics for possible atypical pneumonia and repeat imaging showed recovering pneumonitis hence we have discussed about proceeding with immunotherapy. ?He completed 12 weekly cycles of carbotaxol and every 21 day keytruda ( 6 cycles) ?Most recent imaging with increasing bilateral palatine tonsil lesions and cervical lymphadenopathy ?CT chest without any concern for progression in the chest, groundglass opacities appear to be resolving consistent with infection/inflammation ? cisplatin, docetaxel and CarboTaxol, Keytruda. ? ?He went to see his medical oncologist at [facility] and recommendation was to send him back to surgery for consideration of surgical resection as well as possible tracheostomy.  From medical oncology, they have recommended considering cetuximab every week or every other week.  ?We have discussed that median PFS is in the order of couple months and median overall survival in the study that was quoted is approximately 6 months.  I have also discussed that if his performance status deteriorates, we may have to proceed with palliative care and hospice.  I clearly explained role of hospice with the patient. ? ?He will return to clinic in about 2-1/2 weeks to start cetuximab.  We have discussed common side effects including but not limited to which include acneform eruption, skin rash, fevers, infusion related reaction,constipation, diarrhea and transaminitis.  He understands that side effects of the medication can be life-threatening and permanent. ?

## 2021-12-29 ENCOUNTER — Emergency Department (HOSPITAL_COMMUNITY): Payer: Medicare HMO

## 2021-12-29 ENCOUNTER — Other Ambulatory Visit: Payer: Self-pay

## 2021-12-29 ENCOUNTER — Emergency Department (HOSPITAL_COMMUNITY)
Admission: EM | Admit: 2021-12-29 | Discharge: 2021-12-29 | Disposition: A | Discharge status: Home / Self Care | Payer: Medicare HMO | Attending: Emergency Medicine | Admitting: Emergency Medicine

## 2021-12-29 DIAGNOSIS — J9503 Malfunction of tracheostomy stoma: Secondary | ICD-10-CM | POA: Insufficient documentation

## 2021-12-29 DIAGNOSIS — Z85828 Personal history of other malignant neoplasm of skin: Secondary | ICD-10-CM | POA: Insufficient documentation

## 2021-12-29 DIAGNOSIS — Z79899 Other long term (current) drug therapy: Secondary | ICD-10-CM | POA: Insufficient documentation

## 2021-12-29 DIAGNOSIS — Z430 Encounter for attention to tracheostomy: Secondary | ICD-10-CM

## 2021-12-29 MED ORDER — OXYCODONE HCL 5 MG PO TABS
5.0000 mg | ORAL_TABLET | Freq: Once | ORAL | Status: AC
  Administered 2021-12-29: 5 mg via ORAL
  Filled 2021-12-29: qty 1

## 2021-12-29 NOTE — ED Provider Notes (Signed)
?[facility] DEPT ?Provider Note ? ? ?CSN: [id number] ?Arrival date & time: 12/29/21  1005 ? ?  ? ?History ? ?Chief Complaint  ?Patient presents with  ? Tracheostomy Tube Change  ? ? ?[name] is a 71 y.o. male. ? ?Pt is a 71 yo male with a pmhx significant for etoh abuse, hep C, and squamous cell carcinoma of oropharynx.  Pt underwent trach change on 12/23/2021 and was downsized from a 6UN75H to a 8NI77O with no complications at [facility].  He was d/c on 4/13.  He said he was trying to clean the trach today and it came out.  He could not get it back in, so he called EMS.  Pt denies sob.  No fevers. ? ?  ? ?Home Medications ?Prior to Admission medications   ?Medication Sig Start Date End Date Taking? Authorizing Provider  ?atropine 1 % ophthalmic solution Place 1 drop into the right eye 2 (two) times a day.     [provider]  ?azithromycin (ZITHROMAX) 250 MG tablet Take as directed 09/20/21   [name], MD  ?losartan (COZAAR) 50 MG tablet Take 50 mg by mouth daily. 09/19/21   [provider]  ?magnesium oxide (MAG-OX) 400 (240 Mg) MG tablet Take 1 tablet (400 mg total) by mouth daily. 08/05/21   [name], MD  ?Nutritional Supplements (KATE FARMS STANDARD 1.4) LIQD 487 mLs by Enteral route 3 (three) times daily. 1.5 cartons (487 ml) [employer] 1.4 TID with 60 ml free water before and 60 ml free water after each TF bolus. Provide an additional 120 ml free water TID. 04/22/21   [name], DO  ?nystatin (MYCOSTATIN) 100000 UNIT/ML suspension Take 5 mLs (500,000 Units total) by mouth 4 (four) times daily. 08/12/21   Barrie Folk, PA-C  ?oxyCODONE (OXY IR/ROXICODONE) 5 MG immediate release tablet Take 1 tablet (5 mg total) by mouth every 6 (six) hours as needed for severe pain. 11/08/21   [name], MD  ?pantoprazole (PROTONIX) 40 MG tablet Take 40 mg by mouth 2 (two) times daily. 05/09/21   [provider]  ?prednisoLONE acetate (PRED  FORTE) 1 % ophthalmic suspension Place 1 drop into the right eye 4 (four) times daily.    [provider]  ?Vitamin D, Ergocalciferol, (DRISDOL) 1.25 MG (50000 UNIT) CAPS capsule Take 50,000 Units by mouth every Saturday. 05/22/20   [provider]  ?Water For Irrigation, Sterile (FREE WATER) SOLN Place 100 mLs into feeding tube every 4 (four) hours. 04/22/21   [name], DO  ?prochlorperazine (COMPAZINE) 10 MG tablet Take 1 tablet (10 mg total) by mouth every 6 (six) hours as needed (Nausea or vomiting). 06/06/21 12/17/21  [name], MD  ?   ? ?Allergies    ?Patient has no known allergies.   ? ?Review of Systems   ?Review of Systems  ?Respiratory:    ?     Trach dislodged  ?All other systems reviewed and are negative. ? ?Physical Exam ?Updated Vital Signs ?BP (!) 161/88   Pulse 88   Resp 19   SpO2 100%  ?Physical Exam ?Vitals and nursing note reviewed.  ?Constitutional:   ?   Appearance: Normal appearance.  ?HENT:  ?   Head: Normocephalic and atraumatic.  ?   Right Ear: External ear normal.  ?   Left Ear: External ear normal.  ?   Nose: Nose normal.  ?   Mouth/Throat:  ?   Mouth: Mucous  membranes are moist.  ?   Pharynx: Oropharynx is clear.  ?Eyes:  ?   Extraocular Movements: Extraocular movements intact.  ?   Conjunctiva/sclera: Conjunctivae normal.  ?   Pupils: Pupils are equal, round, and reactive to light.  ?Neck:  ?   Comments: Trach stoma in place ?Cardiovascular:  ?   Rate and Rhythm: Normal rate and regular rhythm.  ?   Pulses: Normal pulses.  ?   Heart sounds: Normal heart sounds.  ?Pulmonary:  ?   Effort: Pulmonary effort is normal.  ?   Breath sounds: Normal breath sounds.  ?Abdominal:  ?   General: Abdomen is flat. Bowel sounds are normal.  ?   Palpations: Abdomen is soft.  ?Genitourinary: ?   Comments: G tube ?Musculoskeletal:     ?   General: Normal range of motion.  ?Skin: ?   General: Skin is warm.  ?   Capillary Refill: Capillary refill takes less than 2 seconds.   ?Neurological:  ?   General: No focal deficit present.  ?   Mental Status: He is alert and oriented to person, place, and time.  ?Psychiatric:     ?   Mood and Affect: Mood normal.     ?   Behavior: Behavior normal.  ? ? ?ED Results / Procedures / Treatments   ?Labs ?(all labs ordered are listed, but only abnormal results are displayed) ?Labs Reviewed - No data to display ? ?EKG ?None ? ?Radiology ?DG Chest Portable 1 View ? ?Result Date: 12/29/2021 ?CLINICAL DATA:  Status post tracheostomy tube placement EXAM: PORTABLE CHEST 1 VIEW COMPARISON:  11/26/2021 FINDINGS: Satisfactory position of tracheostomy tube with tip approximately 5.7 cm above the carina. Left chest wall port a catheter noted with tip at the cavoatrial junction. Heart size and mediastinal contours are normal. No pleural effusion or edema. No airspace densities. IMPRESSION: Satisfactory position of tracheostomy tube. No acute cardiopulmonary abnormalities. Electronically Signed   By: Kerby Moors M.D.   On: 12/29/2021 11:11   ? ?Procedures ?Procedures  ? ? ?Medications Ordered in ED ?Medications  ?oxyCODONE (Oxy IR/ROXICODONE) immediate release tablet 5 mg (5 mg Oral Given 12/29/21 1152)  ? ? ?ED Course/ Medical Decision Making/ A&P ?  ?                        ?Medical Decision Making ?Amount and/or Complexity of Data Reviewed ?Radiology: ordered. ? ?Risk ?Prescription drug management. ? ? ?This patient presents to the ED for concern of trach dislodgement, this involves an extensive number of treatment options, and is a complaint that carries with it a high risk of complications and morbidity.  The differential diagnosis includes trach malfunction ? ? ?Co morbidities that complicate the patient evaluation ? ?Neck cancer ? ? ?Additional history obtained: ? ?Additional history obtained from epic chart review ?External records from outside source obtained and reviewed including EMS report ? ? ? ?Imaging Studies ordered: ? ?I ordered imaging studies  including CXR  ?I independently visualized and interpreted imaging which showed  ?  ?IMPRESSION:  ?Satisfactory position of tracheostomy tube.  ?   ?No acute cardiopulmonary abnormalities.  ? ?I agree with the radiologist interpretation ? ? ?Cardiac Monitoring: ? ?The patient was maintained on a cardiac monitor.  I personally viewed and interpreted the cardiac monitored which showed an underlying rhythm of: nsr ? ? ? ?Critical Interventions: ? ?Trach change ? ? ? ?Problem List / ED Course: ? ?Trach change:  7VE55M replaced. ? ? ?Reevaluation: ? ?After the interventions noted above, I reevaluated the patient and found that they have :improved ? ? ?Social Determinants of Health: ? ?Lives at home ? ? ?Dispostion: ? ?After consideration of the diagnostic results and the patients response to treatment, I feel that the patent would benefit from discharge with outpatient f/u.   ? ? ? ? ? ? ? ?Final Clinical Impression(s) / ED Diagnoses ?Final diagnoses:  ?Tracheostomy, acute management ([place])  ?Tracheostomy malfunction ([place])  ? ? ?Rx / DC Orders ?ED Discharge Orders   ? ? None  ? ?  ? ? ?  ?Isla Pence, MD ?12/29/21 1247 ? ?

## 2021-12-29 NOTE — ED Triage Notes (Signed)
Patient bib ems, patient had trach placed Friday for  throat cancer , patient trying to clean this morning, pulled trach out. Denies shobr, denies pain.  ?

## 2021-12-29 NOTE — Progress Notes (Signed)
?  RT called to room WA 14 for trach placement. Patient from home with trach out. Per notes patient was down size to and # 4 cuffless at [facility]. RT place #4 [place] cuffless with MD at bedside. Pt has equal BS and vitals stable. Trach placed with no complications.  ?

## 2021-12-31 ENCOUNTER — Other Ambulatory Visit: Payer: Self-pay | Admitting: *Deleted

## 2021-12-31 ENCOUNTER — Emergency Department (HOSPITAL_COMMUNITY)
Admission: EM | Admit: 2021-12-31 | Discharge: 2021-12-31 | Disposition: A | Discharge status: Home / Self Care | Payer: Medicare HMO | Attending: Emergency Medicine | Admitting: Emergency Medicine

## 2021-12-31 ENCOUNTER — Emergency Department (HOSPITAL_COMMUNITY): Payer: Medicare HMO

## 2021-12-31 DIAGNOSIS — J9503 Malfunction of tracheostomy stoma: Secondary | ICD-10-CM | POA: Diagnosis present

## 2021-12-31 DIAGNOSIS — C090 Malignant neoplasm of tonsillar fossa: Secondary | ICD-10-CM

## 2021-12-31 DIAGNOSIS — C329 Malignant neoplasm of larynx, unspecified: Secondary | ICD-10-CM

## 2021-12-31 MED ORDER — ONDANSETRON HCL 8 MG PO TABS: 8.0000 mg | ORAL_TABLET | Freq: Two times a day (BID) | ORAL | 1 refills | Status: AC | PRN

## 2021-12-31 MED ORDER — PROCHLORPERAZINE MALEATE 10 MG PO TABS: 10.0000 mg | ORAL_TABLET | Freq: Four times a day (QID) | ORAL | 1 refills | Status: DC | PRN

## 2021-12-31 NOTE — ED Provider Triage Note (Signed)
Emergency Medicine Provider Triage Evaluation Note ? ?[name] , a 71 y.o. male  was evaluated in triage.  Pt complains of dislodgment of his tracheostomy tube.  Reports it has been out for 1 hour.  Was seen for the same a couple days ago at [facility] and had it replaced. ? ?Review of Systems  ?Positive: Tube out ?Negative: Pain ? ?Physical Exam  ?BP 116/82 (BP Location: Right Arm)   Pulse 86   Temp 98.1 ?F (36.7 ?C) (Oral)   Resp 18   SpO2 100%  ?Gen:   Awake, no distress   ?Resp:  Normal effort  ?MSK:   Moves extremities without difficulty  ?Other:  No signs of infection, seems reasonably patent ? ?Medical Decision Making  ?Medically screening exam initiated at 10:04 AM.  Appropriate orders placed.  [name] was informed that the remainder of the evaluation will be completed by another provider, this initial triage assessment does not replace that evaluation, and the importance of remaining in the ED until their evaluation is complete. ? ? ?  ?Dimitriy Carreras, [facility], PA-C ?12/31/21 1005 ? ?

## 2021-12-31 NOTE — Progress Notes (Signed)
Pharmacist Chemotherapy Monitoring - Initial Assessment   ? ?Anticipated start date: 01/07/22  ? ?The following has been reviewed per standard work regarding the patient's treatment regimen: ?The patient's diagnosis, treatment plan and drug doses, and organ/hematologic function ?Lab orders and baseline tests specific to treatment regimen  ?The treatment plan start date, drug sequencing, and pre-medications ?Prior authorization status  ?Patient's documented medication list, including drug-drug interaction screen and prescriptions for anti-emetics and supportive care specific to the treatment regimen ?The drug concentrations, fluid compatibility, administration routes, and timing of the medications to be used ?The patient's access for treatment and lifetime cumulative dose history, if applicable  ?The patient's medication allergies and previous infusion related reactions, if applicable  ? ?Changes made to treatment plan:  ?treatment plan date ? ?Follow up needed:  ?Pending authorization for treatment  ? ? ?[name] [place], [place], ?12/31/2021  2:43 PM ? ?

## 2021-12-31 NOTE — ED Provider Notes (Signed)
?[facility] ?Provider Note ? ? ?CSN: [id number] ?Arrival date & time: 12/31/21  0935 ? ?  ? ?History ? ?Chief Complaint  ?Patient presents with  ? trach displaced  ? ? ?[name] is a 71 y.o. male with a past medical history of squamous cell carcinoma of the oropharynx presenting today due to a dislodged tracheostomy tube.  He currently has a 4UN65R tube.  He reports that this morning he accidentally pulled it out.  This was around 1 to 2 hours ago.  Denies any shortness of breath. ? ?HPI ? ?  ? ?Home Medications ?Prior to Admission medications   ?Medication Sig Start Date End Date Taking? Authorizing Provider  ?atropine 1 % ophthalmic solution Place 1 drop into the right eye 2 (two) times a day.     [provider]  ?azithromycin (ZITHROMAX) 250 MG tablet Take as directed 09/20/21   [name], MD  ?losartan (COZAAR) 50 MG tablet Take 50 mg by mouth daily. 09/19/21   [provider]  ?magnesium oxide (MAG-OX) 400 (240 Mg) MG tablet Take 1 tablet (400 mg total) by mouth daily. 08/05/21   [name], MD  ?Nutritional Supplements (KATE FARMS STANDARD 1.4) LIQD 487 mLs by Enteral route 3 (three) times daily. 1.5 cartons (487 ml) [employer] 1.4 TID with 60 ml free water before and 60 ml free water after each TF bolus. Provide an additional 120 ml free water TID. 04/22/21   [name], DO  ?nystatin (MYCOSTATIN) 100000 UNIT/ML suspension Take 5 mLs (500,000 Units total) by mouth 4 (four) times daily. 08/12/21   Walisiewicz, Harley Hallmark, PA-C  ?ondansetron (ZOFRAN) 8 MG tablet Take 1 tablet (8 mg total) by mouth 2 (two) times daily as needed for refractory nausea / vomiting. Starting on day 3 after chemo. 12/31/21   [name], MD  ?oxyCODONE (OXY IR/ROXICODONE) 5 MG immediate release tablet Take 1 tablet (5 mg total) by mouth every 6 (six) hours as needed for severe pain. 11/08/21   [name], MD  ?pantoprazole (PROTONIX) 40 MG tablet Take 40 mg by  mouth 2 (two) times daily. 05/09/21   [provider]  ?prednisoLONE acetate (PRED FORTE) 1 % ophthalmic suspension Place 1 drop into the right eye 4 (four) times daily.    [provider]  ?prochlorperazine (COMPAZINE) 10 MG tablet Take 1 tablet (10 mg total) by mouth every 6 (six) hours as needed (Nausea or vomiting). 12/31/21   [name], MD  ?Vitamin D, Ergocalciferol, (DRISDOL) 1.25 MG (50000 UNIT) CAPS capsule Take 50,000 Units by mouth every Saturday. 05/22/20   [provider]  ?Water For Irrigation, Sterile (FREE WATER) SOLN Place 100 mLs into feeding tube every 4 (four) hours. 04/22/21   [name], DO  ?   ? ?Allergies    ?Patient has no known allergies.   ? ?Review of Systems   ?Review of Systems ? ?Physical Exam ?Updated Vital Signs ?BP 116/82 (BP Location: Right Arm)   Pulse 86   Temp 98.1 ?F (36.7 ?C) (Oral)   Resp 18   SpO2 100%  ?Physical Exam ?Vitals and nursing note reviewed.  ?Constitutional:   ?   Appearance: Normal appearance.  ?HENT:  ?   Head: Normocephalic and atraumatic.  ?Eyes:  ?   General: No scleral icterus. ?   Conjunctiva/sclera: Conjunctivae normal.  ?Neck:  ?   Comments: No signs of infection around the trach stoma.  No bleeding ?Pulmonary:  ?  Effort: Pulmonary effort is normal. No respiratory distress.  ?Musculoskeletal:  ?   Cervical back: Normal range of motion. No tenderness.  ?Skin: ?   Findings: No rash.  ?Neurological:  ?   Mental Status: He is alert.  ?Psychiatric:     ?   Mood and Affect: Mood normal.  ? ? ?ED Results / Procedures / Treatments   ?Labs ?(all labs ordered are listed, but only abnormal results are displayed) ?Labs Reviewed - No data to display ? ?EKG ?None ? ?Radiology ?DG Chest Portable 1 View ? ?Result Date: 12/29/2021 ?CLINICAL DATA:  Status post tracheostomy tube placement EXAM: PORTABLE CHEST 1 VIEW COMPARISON:  11/26/2021 FINDINGS: Satisfactory position of tracheostomy tube with tip approximately 5.7 cm above the  carina. Left chest wall port a catheter noted with tip at the cavoatrial junction. Heart size and mediastinal contours are normal. No pleural effusion or edema. No airspace densities. IMPRESSION: Satisfactory position of tracheostomy tube. No acute cardiopulmonary abnormalities. Electronically Signed   By: Kerby Moors M.D.   On: 12/29/2021 11:11   ? ?Procedures ?Procedures  ? ?Medications Ordered in ED ?Medications - No data to display ? ?ED Course/ Medical Decision Making/ A&P ?  ?                        ?Medical Decision Making ?Amount and/or Complexity of Data Reviewed ?Radiology: ordered. ? ? ?71 year old male with a past medical history of squamous cell carcinoma of the supraglottis, chronic hep C and malnutrition presenting after dislodging his tracheostomy.  This occurred 1 to 2 hours ago. ? ?Respiratory was contacted and they replaced his size for tracheostomy.  Central supply was contacted for more tubes due to patient not having any at home ? ?X-ray was ordered and independently viewed by me.  The tracheostomy tube appears to be in place.  I agree with the radiologist assessment. ? ? ?Patient was sent home with materials for his tracheostomy care and picked up by family member.  Return precautions discussed. ? ?Final Clinical Impression(s) / ED Diagnoses ?Final diagnoses:  ?Tracheostomy malfunction ([place])  ? ? ?Rx / DC Orders ?ED Discharge Orders   ? ? None  ? ?  ? ?Results and diagnoses were explained to the patient. Return precautions discussed in full. Patient had no additional questions and expressed complete understanding. ? ? ?This chart was dictated using voice recognition software.  Despite best efforts to proofread,  errors can occur which can change the documentation meaning.  ?  ?Rhae Hammock, PA-C ?12/31/21 1205 ? ?  ?Daleen Bo, MD ?12/31/21 1622 ? ?

## 2021-12-31 NOTE — Telephone Encounter (Signed)
Received faxed refill requests from [facility] for Compazine and Zofran ?Refills approved by Dr. Chryl Heck -- refills sent via escribe ?

## 2021-12-31 NOTE — ED Triage Notes (Signed)
Pt. Stated, went to [facility] on Sunday were trach came out they put back in and came out again today, 2 hours ago. ?

## 2021-12-31 NOTE — Procedures (Signed)
Tracheostomy Change Note ? ?Patient Details:   ?Name: [name] ?DOB: 04-Nov-1950 ?MRN: [id number] ?   ?Airway Documentation: ?   ? ?Evaluation ? O2 sats: stable throughout ?Complications: No apparent complications ?Patient did tolerate procedure well. ?  ?  ? ?Esperanza Sheets T ?12/31/2021, 10:34 AM ?

## 2022-01-06 ENCOUNTER — Other Ambulatory Visit: Payer: Self-pay

## 2022-01-06 ENCOUNTER — Encounter: Payer: Self-pay | Admitting: Hematology and Oncology

## 2022-01-06 ENCOUNTER — Inpatient Hospital Stay: Payer: Medicare HMO

## 2022-01-06 ENCOUNTER — Inpatient Hospital Stay (HOSPITAL_BASED_OUTPATIENT_CLINIC_OR_DEPARTMENT_OTHER): Payer: Medicare HMO | Admitting: Hematology and Oncology

## 2022-01-06 DIAGNOSIS — C321 Malignant neoplasm of supraglottis: Secondary | ICD-10-CM | POA: Diagnosis not present

## 2022-01-06 DIAGNOSIS — G893 Neoplasm related pain (acute) (chronic): Secondary | ICD-10-CM | POA: Diagnosis not present

## 2022-01-06 DIAGNOSIS — Z931 Gastrostomy status: Secondary | ICD-10-CM | POA: Diagnosis not present

## 2022-01-06 DIAGNOSIS — Z5111 Encounter for antineoplastic chemotherapy: Secondary | ICD-10-CM | POA: Diagnosis not present

## 2022-01-06 LAB — CBC WITH DIFFERENTIAL/PLATELET
Abs Immature Granulocytes: 0.04 10*3/uL (ref 0.00–0.07)
Basophils Absolute: 0.1 10*3/uL (ref 0.0–0.1)
Basophils Relative: 1 %
Eosinophils Absolute: 0.4 10*3/uL (ref 0.0–0.5)
Eosinophils Relative: 9 %
HCT: 27.5 % — ABNORMAL LOW (ref 39.0–52.0)
Hemoglobin: 9.1 g/dL — ABNORMAL LOW (ref 13.0–17.0)
Immature Granulocytes: 1 %
Lymphocytes Relative: 15 %
Lymphs Abs: 0.7 10*3/uL (ref 0.7–4.0)
MCH: 32.4 pg (ref 26.0–34.0)
MCHC: 33.1 g/dL (ref 30.0–36.0)
MCV: 97.9 fL (ref 80.0–100.0)
Monocytes Absolute: 0.5 10*3/uL (ref 0.1–1.0)
Monocytes Relative: 12 %
Neutro Abs: 2.7 10*3/uL (ref 1.7–7.7)
Neutrophils Relative %: 62 %
Platelets: 303 10*3/uL (ref 150–400)
RBC: 2.81 MIL/uL — ABNORMAL LOW (ref 4.22–5.81)
RDW: 13 % (ref 11.5–15.5)
WBC: 4.4 10*3/uL (ref 4.0–10.5)
nRBC: 0 % (ref 0.0–0.2)

## 2022-01-06 LAB — COMPREHENSIVE METABOLIC PANEL
ALT: 8 U/L (ref 0–44)
AST: 11 U/L — ABNORMAL LOW (ref 15–41)
Albumin: 3.8 g/dL (ref 3.5–5.0)
Alkaline Phosphatase: 53 U/L (ref 38–126)
Anion gap: 6 (ref 5–15)
BUN: 11 mg/dL (ref 8–23)
CO2: 29 mmol/L (ref 22–32)
Calcium: 9.5 mg/dL (ref 8.9–10.3)
Chloride: 100 mmol/L (ref 98–111)
Creatinine, Ser: 0.64 mg/dL (ref 0.61–1.24)
GFR, Estimated: >60 mL/min (ref >60)
Glucose, Bld: 74 mg/dL (ref 70–99)
Potassium: 4.4 mmol/L (ref 3.5–5.1)
Sodium: 135 mmol/L (ref 135–145)
Total Bilirubin: 0.4 mg/dL (ref 0.3–1.2)
Total Protein: 7.4 g/dL (ref 6.5–8.1)

## 2022-01-06 MED ORDER — OXYCODONE HCL 5 MG PO TABS: 5.0000 mg | ORAL_TABLET | Freq: Four times a day (QID) | ORAL | 0 refills | Status: DC | PRN

## 2022-01-06 NOTE — Progress Notes (Signed)
[facility] Follow up: ?  ? ?Alan Ebbs, MD ?[address] ?[place] [address] ? ? ?DIAGNOSIS:  Cancer Staging  ?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[place]) ?Staging form: Larynx - Supraglottis, AJCC 8th Edition ?- Clinical stage from 07/23/2021: Stage IVA (rcT2, cN2b, cM0) - Signed by [name], MD on 07/23/2021 ?Stage prefix: Recurrence ? ? ?SUMMARY OF ONCOLOGIC HISTORY: ?Oncology History Overview Note  ?Recurrent SCC, p16 neg ?  ?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[place])  ?04/01/2019 Procedure  ? Direct laryngoscopy w/ debulking of the tumor arising from the lateral surface of the right aryepiglottic fold and anterior/lateral aspect of the piriform sinus on the right side  ?  ?04/01/2019 Pathology Results  ? Accession: [id number] ? ?Larynx, biopsy, Right Supraglottic Tumor ?- POORLY DIFFERENTIATED SQUAMOUS CELL CARCINOMA WITH FOCAL SARCOMATOID CHANGES. SEE NOTE ?  ?04/01/2019 Surgery  ? Pre-Op Dx:   right supraglottic carcinoma ?  ?Post-op Dx: T2 N0 M0 (stage II) right supraglottic carcinoma ?  ?Proc: Direct laryngoscopy with biopsy.  Cervical esophagoscopy.  Bronchoscopy. ?  ?Surg:  [name] ?   ?Findings: A bulky necrotic and semi-pedunculated tumor coming off the lateral surface of the right aryepiglottic fold and the anterior and lateral aspect of the piriform sinus on the right side.  Airway was compromised by the tumor mass at intubation. ?  ?04/11/2019 Imaging  ? CT neck: ?IMPRESSION: ?The glottis is closed with suboptimal evaluation of the vocal cords. ?No asymmetry of the cords. Correlate with laryngoscopy results. ?  ?No enlarged lymph nodes in the neck. 7 mm right level 2 lymph node ?may be reactive. No definite pathologic lymph nodes in the neck. ?  ?04/11/2019 Imaging  ? CT neck ? ?This CT was performed after surgical debulking of an exophytic laryngeal tumor. ?  ?Most apparent on series 2, image 51 there is abnormal asymmetric  hyperenhancement along the surface of the right piriform sinus, and perhaps also involving the bilateral AE folds. ?  ?04/22/2019 Imaging  ? PET: ?IMPRESSION: ?1. Mild residual activity in the posterior RIGHT hypopharynx ?confined to the mucosa. ?2. No evidence of hypermetabolic metastatic lymph nodes in LEFT or ?RIGHT neck. ?3. No evidence distant metastatic disease. ?  ?05/02/2019 Initial Diagnosis  ? 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[place]) ?  ?05/12/2019 - 07/06/2019 Radiation Therapy  ? Radiation Treatment Dates: 05/12/2019 through 07/06/2019 ?Site Technique Total Dose (Gy) Dose per Fx (Gy) Completed Fx Beam Energies  ?Head & neck: HN_larynx IMRT 70/70 2 35/35 6X  ?  ? ?Radiation Treatment Dates: 09/18/2020 through 10/31/2020 ?Site Technique Total Dose (Gy) Dose per Fx (Gy) Completed Fx Beam Energies  ?Oropharynx: HN_orophar IMRT 60/60 2 30/30 6X  ?  ?  ?  ?10/27/2019 PET scan  ? 1. New FDG avid right level 2 cervical lymph node concerning for recurrent disease. No findings of distant metastatic disease within the chest, abdomen or pelvis. ?2. Mild nonspecific increased uptake in the right side of larynx is similar to previous exam. ?3. Aortic Atherosclerosis (ICD10-I70.0). Coronary artery calcifications.  ?  ?11/21/2019 Pathology Results  ? SURGICAL PATHOLOGY  ?CASE: [id number]  ?[name]  ?Surgical Pathology Report  ? ?FINAL MICROSCOPIC DIAGNOSIS:  ? ?A. LYMPH NODE, RIGHT CERVICAL:  ?- Poorly differentiated carcinoma consistent with metastatic squamous cell carcinoma.  ?- See comment ?  ?01/06/2020 Pathology Results  ? SURGICAL PATHOLOGY  ?CASE: [id number]  ?[name]  ?Surgical Pathology Report  ? ?  Clinical History: metastatic cancer to cervical lymph nodes (cm)  ? ?FINAL MICROSCOPIC DIAGNOSIS:  ? ?A. LYMPH NODE, RIGHT MODIFIED, DISSECTION:  ?- Metastatic squamous cell carcinoma in four of nineteen lymph nodes (4/19).  ?- Largest metastatic nodule is 3.1 cm with 5 mm  extra-nodal extension.  ?- Lymphovascular space involvement in peri-nodal connective tissue.  ?- See comment.  ?  ?01/06/2020 Surgery  ? PRE-OPERATIVE DIAGNOSIS:  METASTATIC CANCER TO CERVICAL LYMPH NODES ?  ?POST-OPERATIVE DIAGNOSIS:  METASTATIC CANCER TO CERVICAL LYMPH NODES ?  ?PROCEDURE:  Procedure(s): ?DIRECT LARYNGOSCOPY ?MODIFIED RADICAL NECK DISSECTION ?  ?SURGEON:  [name], MD ?    ?SPECIMEN: Right modified neck dissection including levels 2 and 3, including the internal jugular vein, suture marks the inferior jugular vein stump. ?  ?07/05/2020 Imaging  ? 1. Sequelae of radiation and right neck dissection from the previous contrasted Neck CT. ?  ?2. Asymmetric soft tissue thickening and enhancement along the right lateral pharynx. Although this might be asymmetric mucositis, the appearance is suspicious and recommend direct inspection. NI-RADS category 2a. ?  ?3. Suspicious small 6 mm hyperenhancing nodular soft tissue along the posterior margin of the neck dissection at right level 3. NI-RADS category 2 vs 3 - although this is likely too small for imaging guided biopsy. Repeat PET-CT may be most valuable. ?  ?4. Mild inflammatory appearing right upper lobe centrilobular ground-glass opacity, new since February. Consider mild or developing right upper lobe infection. Post radiation changes to the lung apices. ?  ?  ?  ?08/13/2020 Pathology Results  ? A. OROPHARNYX, BIOPSY:  ?- Poorly differentiated squamous cell carcinoma.  ?- See comment.  ?  ?08/13/2020 Surgery  ? POST-OPERATIVE DIAGNOSIS:  Tonsillary Mass History of Laryngeal  Cancer ?  ?PROCEDURE:  Procedure(s): ?DIRECT LARYNGOSCOPY ?BIOPSY OF OROPHARYNGEAL MASS ?  ?08/30/2020 PET scan  ? 1. Recurrence of head neck carcinoma with a broad hypermetabolic pharyngeal mucosal lesion involving the RIGHT lateral posterior oropharynx and hypopharynx extending from the palatine tonsil to the vallecula. ?2. Hypermetabolic lymph node posterior  sternocleidomastoid muscle on the RIGHT (level 3). ?3. Hypermetabolic nodule within the LEFT parotid glands favored small primary parotid neoplasm. ?4. No evidence thoracic metastasis ?  ?09/24/2020 - 10/30/2020 Chemotherapy  ? He received weekly cisplatin ?  ?01/31/2021 PET scan  ? Findings of worsening nodal disease with a node or group of nodes in the LEFT neck and a RIGHT supraclavicular lymph node as discussed. ?  ?Post treatment changes with soft tissue fullness in the area of the prior tumor but with markedly diminished metabolic activity. ?  ?  ?03/11/2021 Imaging  ? Ct neck ? ?Diffuse pharyngeal and laryngeal mucosal and submucosal edema probably related to acute radiation injury. The airway is narrow and could possibly be critically narrowed. I do not see any evidence of recurrent mucosal or submucosal tumor identifiable. ?  ?Similar appearance of the recurrent malignant lymphadenopathy on the left at level 2 and level 3 and on the right in the low supraclavicular region just above and behind the clavicular head. ?  ?04/12/2021 Imaging  ? Ct abdomen and pelvis ? ?1. Contrast material in the peritoneal cavity of the upper abdomen suggesting leak along the gastrostomy tract. ?2. Gastrostomy tube is appropriately positioned. No evidence of abscess. ?3. 1.3 cm right middle lobe nodule, new since 01/31/2021. Favor infectious/inflammatory over primary/secondary neoplasm given the relatively short time frame of development. ?  ?04/12/2021 Surgery  ? Postoperative Diagnosis: Perforated duodenal ulcer (1st  portion) ?  ?Surgical Procedure:  ?Modified Graham patch of duodenal ulcer ?Gastrostomy tube exchange ?  ?Operative Team Members:  ?Surgeon(s) and Role: ?   * Stechschulte, Nickola Major, MD - Primary  ?   ?Drains:  (19 Fr) Jackson-Pratt drain(s) with closed bulb suction in the right upper quadrant near the duodenal ulcer repair and Gastrostomy Tube 26 fr ?  ?05/09/2021 Imaging  ? 1. Progression of lesion in the right tonsil  with central fluid collection and peripheral enhancement. This was a site of prior recurrent sick could represent progressive tumor or abscess. Continued follow-up recommended. ?2. Progressive malignant lymphadenopathy in the le

## 2022-01-06 NOTE — Assessment & Plan Note (Signed)
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   ?We had to hold immunotherapy for cycle 8 because of concern of pneumonitis from immunotherapy versus recovering COVID-19 infection.  However since it was thought to be grade 1, we gave him a challenge of short course of steroids as well as antibiotics for possible atypical pneumonia and repeat imaging showed recovering pneumonitis hence we have discussed about proceeding with immunotherapy. ?He completed 12 weekly cycles of carbotaxol and every 21 day keytruda ( 6 cycles) ?Most recent imaging with increasing bilateral palatine tonsil lesions and cervical lymphadenopathy ?CT chest without any concern for progression in the chest, groundglass opacities appear to be resolving consistent with infection/inflammation ? cisplatin, docetaxel and CarboTaxol, Keytruda. ? ?He went to see his medical oncologist at [facility] and recommendation was to send him back to surgery for consideration of surgical resection as well as possible tracheostomy.  From medical oncology, they have recommended considering cetuximab every week or every other week.  ? ?He is here for cycle 1 day 1 of cetuximab.  We have discussed common side effects including but not limited to which include acneform eruption, skin rash, fevers, infusion related reaction,constipation, diarrhea and transaminitis.  He understands that side effects of the medication can be life-threatening and permanent. ? ?We have previously discussed that median PFS is in the order of couple months and median overall survival in the study that was quoted is approximately 6 months.  I have also discussed that if his performance status deteriorates, we may have to proceed with palliative care and hospice.  We have on multiple occasions discussed about hospice. ? ?He continues to do well.  He would like  to proceed with treatment as planned.  We will plan imaging after about 6 weekly cycles of cetuximab. ?

## 2022-01-06 NOTE — Assessment & Plan Note (Signed)
He continues to use oxycodone about 3-4 times a day for pain in his neck as well as around the G-tube.  Refilled it. ?

## 2022-01-06 NOTE — Assessment & Plan Note (Signed)
He has some ongoing diarrhea from recent use of Osmolite while he was hospitalized.  He is now back on [employer].  Diarrhea grade 1 about 3 times a day, watery. ?We will continue to monitor.  His weight appears to be stable overall. ?

## 2022-01-07 ENCOUNTER — Inpatient Hospital Stay: Payer: Medicare HMO

## 2022-01-07 VITALS — BP 160/93 | HR 65 | Temp 98.6°F | Resp 17

## 2022-01-07 DIAGNOSIS — Z5111 Encounter for antineoplastic chemotherapy: Secondary | ICD-10-CM | POA: Diagnosis not present

## 2022-01-07 DIAGNOSIS — C321 Malignant neoplasm of supraglottis: Secondary | ICD-10-CM

## 2022-01-07 MED ORDER — EMPTY CONTAINERS FLEXIBLE MISC
250.0000 mg/m2 | Freq: Once | Status: AC
  Administered 2022-01-07: 400 mg via INTRAVENOUS
  Filled 2022-01-07: qty 200

## 2022-01-07 MED ORDER — HEPARIN SOD (PORK) LOCK FLUSH 100 UNIT/ML IV SOLN
500.0000 [IU] | Freq: Once | INTRAVENOUS | Status: AC | PRN
  Administered 2022-01-07: 500 [IU]

## 2022-01-07 MED ORDER — FAMOTIDINE IN NACL 20-0.9 MG/50ML-% IV SOLN
20.0000 mg | Freq: Once | INTRAVENOUS | Status: AC
  Administered 2022-01-07: 20 mg via INTRAVENOUS
  Filled 2022-01-07: qty 50

## 2022-01-07 MED ORDER — SODIUM CHLORIDE 0.9 % IV SOLN: Freq: Once | INTRAVENOUS | Status: AC

## 2022-01-07 MED ORDER — DIPHENHYDRAMINE HCL 50 MG/ML IJ SOLN
50.0000 mg | Freq: Once | INTRAMUSCULAR | Status: AC
  Administered 2022-01-07: 50 mg via INTRAVENOUS
  Filled 2022-01-07: qty 1

## 2022-01-07 MED ORDER — SODIUM CHLORIDE 0.9% FLUSH
10.0000 mL | INTRAVENOUS | Status: DC | PRN
  Administered 2022-01-07: 10 mL

## 2022-01-07 NOTE — Patient Instructions (Addendum)
[facility]  Discharge Instructions: ?Thank you for choosing [facility] to provide your oncology and hematology care.  ? ?If you have a lab appointment with the [facility], please go directly to the [facility] and check in at the registration area. ?  ?Wear comfortable clothing and clothing appropriate for easy access to any Portacath or PICC line.  ? ?We strive to give you quality time with your provider. You may need to reschedule your appointment if you arrive late (15 or more minutes).  Arriving late affects you and other patients whose appointments are after yours.  Also, if you miss three or more appointments without notifying the office, you may be dismissed from the clinic at the provider?s discretion.    ?  ?For prescription refill requests, have your pharmacy contact our office and allow 72 hours for refills to be completed.   ? ?Today you received the following chemotherapy and/or immunotherapy agents: Erbitux    ?  ?To help prevent nausea and vomiting after your treatment, we encourage you to take your nausea medication as directed. ? ?BELOW ARE SYMPTOMS THAT SHOULD BE REPORTED IMMEDIATELY: ?*FEVER GREATER THAN 100.4 F (38 ?C) OR HIGHER ?*CHILLS OR SWEATING ?*NAUSEA AND VOMITING THAT IS NOT CONTROLLED WITH YOUR NAUSEA MEDICATION ?*UNUSUAL SHORTNESS OF BREATH ?*UNUSUAL BRUISING OR BLEEDING ?*URINARY PROBLEMS (pain or burning when urinating, or frequent urination) ?*BOWEL PROBLEMS (unusual diarrhea, constipation, pain near the anus) ?TENDERNESS IN MOUTH AND THROAT WITH OR WITHOUT PRESENCE OF ULCERS (sore throat, sores in mouth, or a toothache) ?UNUSUAL RASH, SWELLING OR PAIN  ?UNUSUAL VAGINAL DISCHARGE OR ITCHING  ? ?Items with * indicate a potential emergency and should be followed up as soon as possible or go to the Emergency Department if any problems should occur. ? ?Please show the CHEMOTHERAPY ALERT CARD or IMMUNOTHERAPY ALERT CARD at check-in to  the Emergency Department and triage nurse. ? ?Should you have questions after your visit or need to cancel or reschedule your appointment, please contact [facility]  Dept: [phone]  and follow the prompts.  Office hours are 8:00 a.m. to 4:30 p.m. Monday - Friday. Please note that voicemails left after 4:00 p.m. may not be returned until the following business day.  We are closed weekends and major holidays. You have access to a nurse at all times for urgent questions. Please call the main number to the clinic Dept: [phone] and follow the prompts. ? ? ?For any non-urgent questions, you may also contact your provider using MyChart. We now offer e-Visits for anyone 106 and older to request care online for non-urgent symptoms. For details visit [email or web address]. ?  ?Also download the MyChart app! Go to the app store, search "MyChart", open the app, select [facility], and log in with your MyChart username and password. ? ?Due to Covid, a mask is required upon entering the hospital/clinic. If you do not have a mask, one will be given to you upon arrival. For doctor visits, patients may have 1 support person aged 23 or older with them. For treatment visits, patients cannot have anyone with them due to current Covid guidelines and our immunocompromised population.  ? ?Cetuximab injection ?What is this medication? ?CETUXIMAB (se TUX i mab) is a monoclonal antibody. It is used to treat colorectal cancer and head and neck cancer. ?This medicine may be used for other purposes; ask your health care provider or pharmacist if you have questions. ?COMMON BRAND NAME(S):  Erbitux ?What should I tell my care team before I take this medication? ?They need to know if you have any of these conditions: ?heart disease ?history of irregular heartbeat ?history of low levels of calcium, magnesium, or potassium in the blood ?history of tick bites ?lung or breathing disease, like asthma ?red meat  allergy ?an unusual or allergic reaction to cetuximab, other medicines, foods, dyes, or preservatives ?pregnant or trying to get pregnant ?breast-feeding ?How should I use this medication? ?This drug is given as an infusion into a vein. It is administered in a hospital or clinic by a specially trained health care professional. ?Talk to your pediatrician regarding the use of this medicine in children. Special care may be needed. ?Overdosage: If you think you have taken too much of this medicine contact a poison control center or emergency room at once. ?NOTE: This medicine is only for you. Do not share this medicine with others. ?What if I miss a dose? ?It is important not to miss your dose. Call your doctor or health care professional if you are unable to keep an appointment. ?What may interact with this medication? ?Interactions are not expected. ?This list may not describe all possible interactions. Give your health care provider a list of all the medicines, herbs, non-prescription drugs, or dietary supplements you use. Also tell them if you smoke, drink alcohol, or use illegal drugs. Some items may interact with your medicine. ?What should I watch for while using this medication? ?Visit your doctor or health care professional for regular checks on your progress. This drug may make you feel generally unwell. This is not uncommon, as chemotherapy can affect healthy cells as well as cancer cells. Report any side effects. Continue your course of treatment even though you feel ill unless your doctor tells you to stop. ?This medicine can make you more sensitive to the sun. Keep out of the sun while taking this medicine and for 2 months after the last dose. If you cannot avoid being in the sun, wear protective clothing and use sunscreen. Do not use sun lamps or tanning beds/booths. ?You may need blood work done while you are taking this medicine. ?In some cases, you may be given additional medicines to help with side  effects. Follow all directions for their use. ?Call your doctor or health care professional for advice if you get a fever, chills or sore throat, or other symptoms of a cold or flu. Do not treat yourself. This drug decreases your body's ability to fight infections. Try to avoid being around people who are sick. ?Avoid taking products that contain aspirin, acetaminophen, ibuprofen, naproxen, or ketoprofen unless instructed by your doctor. These medicines may hide a fever. ?Do not become pregnant while taking this medicine. Women should inform their doctor if they wish to become pregnant or think they might be pregnant. There is a potential for serious side effects to an unborn child. Use adequate birth control methods. Avoid pregnancy for at least 2 months after your last dose. Talk to your health care professional or pharmacist for more information. Do not breast-feed an infant while taking this medicine or during the 2 months after your last dose. ?What side effects may I notice from receiving this medication? ?Side effects that you should report to your doctor or health care professional as soon as possible: ?allergic reactions like skin rash, itching or hives, swelling of the face, lips, or tongue ?breathing problems ?changes in vision ?fast, irregular heartbeat ?feeling faint or lightheaded, falls ?  fever, chills ?mouth sores ?redness, blistering, peeling or loosening of the skin, including inside the mouth ?trouble passing urine or change in the amount of urine ?unusually weak or tired ?Side effects that usually do not require medical attention (report to your doctor or health care professional if they continue or are bothersome): ?changes in skin like acne, cracks, skin dryness ?constipation ?diarrhea ?headache ?nail changes ?nausea, vomiting ?stomach upset ?weight loss ?This list may not describe all possible side effects. Call your doctor for medical advice about side effects. You may report side effects to  FDA at 1-800-FDA-1088. ?Where should I keep my medication? ?This drug is given in a hospital or clinic and will not be stored at home. ?NOTE: This sheet is a summary. It may not cover all possible informatio

## 2022-01-07 NOTE — Progress Notes (Signed)
No loading dose of erbitux per Dr Chryl Heck ?

## 2022-01-08 IMAGING — CT NM PET TUM IMG RESTAG (PS) SKULL BASE T - THIGH
7 series · 25 of 25 positions shown · non-contrast
Comparison: Multiple prior studies most recent PET from Saturday August, 2020.

CLINICAL DATA: Subsequent treatment strategy for head neck cancer.

EXAM:
NUCLEAR MEDICINE PET SKULL BASE TO THIGH
TECHNIQUE: 5.9 mCi F-18 FDG was injected intravenously. Full-ring PET imaging
was performed from the skull base to thigh after the radiotracer. CT
data was obtained and used for attenuation correction and anatomic
localization.
Fasting blood glucose: 96 mg/dl

[Series 3: pet hn_sk_thigh ac · axial · 5.0mm · 4.07mm/px · z∈[-1302,-434]mm · 6 of 218 slices shown]
[im 1/218]
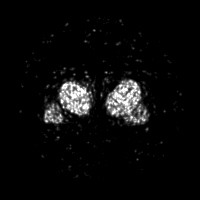
[im 44/218]
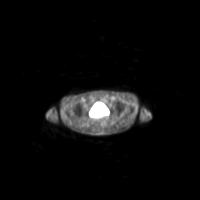
[im 87/218]
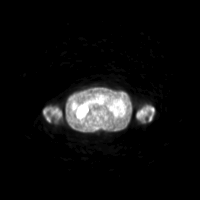
[im 131/218]
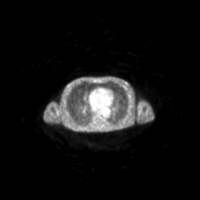
[im 174/218]
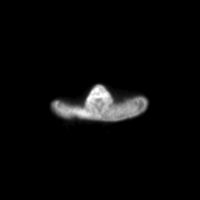
[im 218/218]
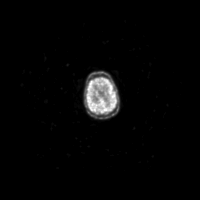

[Series 4: ct hn_sk_th 5.0 bf37 · axial · 5.0mm · 0.98mm/px · z∈[-1302,-434]mm · 5 of 218 slices shown]
[im 1/218]
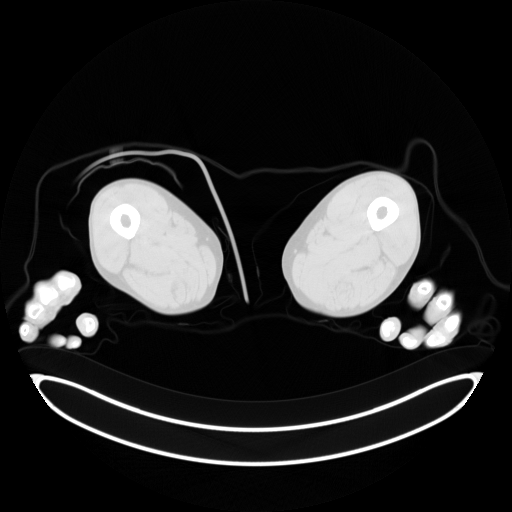
[im 55/218]
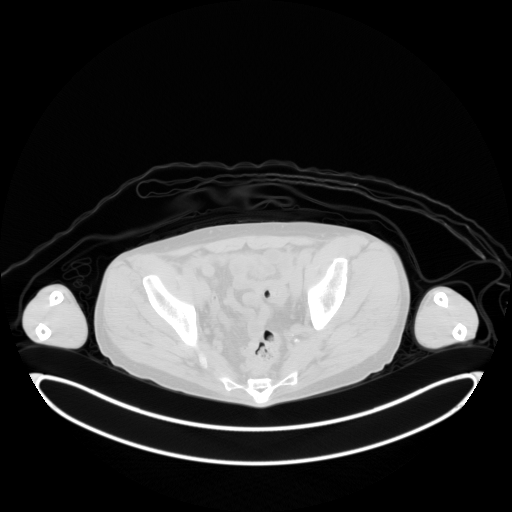
[im 109/218]
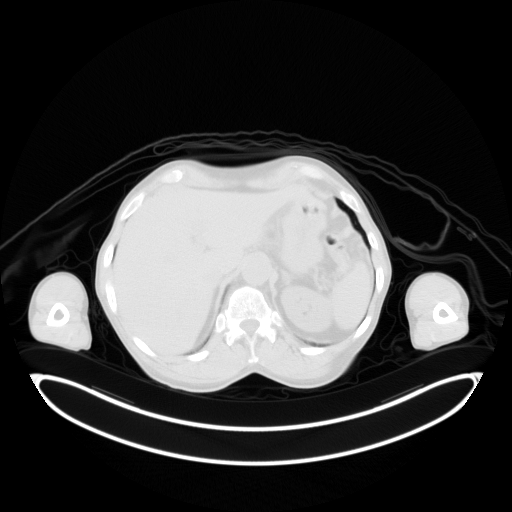
[im 163/218  brain]
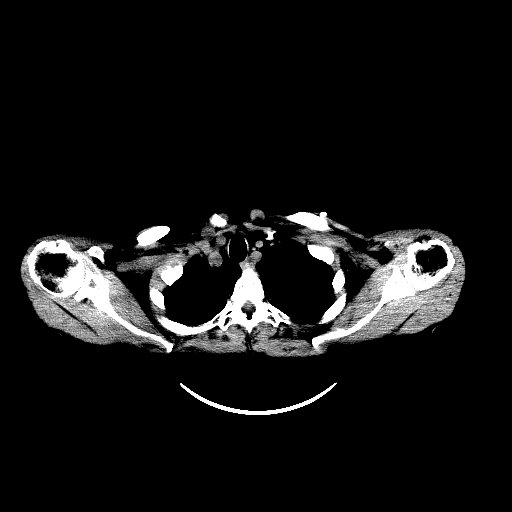
[im 218/218  brain]
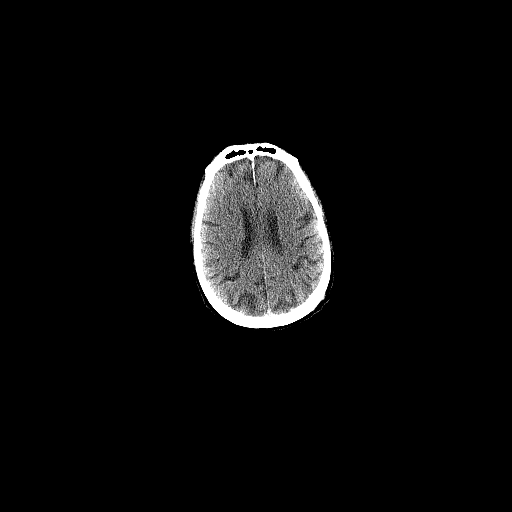

[Series 5: pet hn_sk_thigh nac · axial · 5.0mm · 4.07mm/px · z∈[-1302,-434]mm · 5 of 218 slices shown]
[im 1/218]
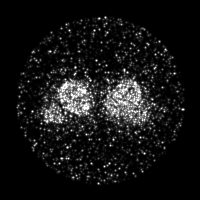
[im 55/218]
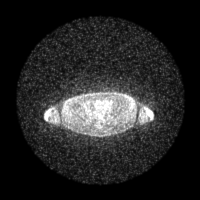
[im 109/218]
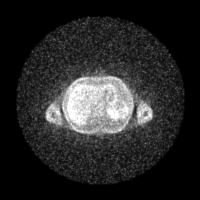
[im 163/218]
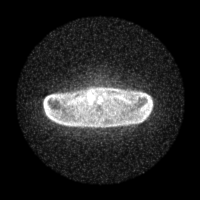
[im 218/218]
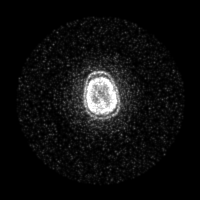

[Series 8: ct hn_sk_th 5.0 br59 lung_bone · axial · 5.0mm · 0.59mm/px · z∈[-884,-628]mm · 2 of 65 slices shown]
[im 1/65]
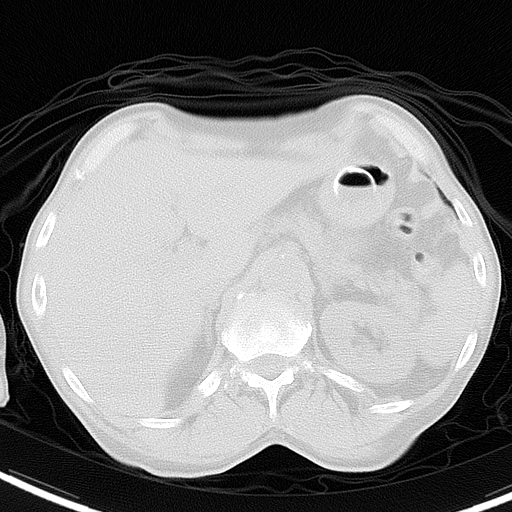
[im 65/65]
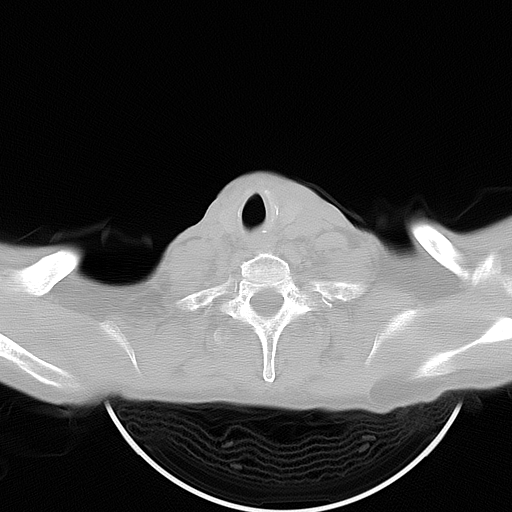

[Series 603: <mip collection> · coronal · 1.80mm/px · 1 of 32 slices shown]
[im 1/32]
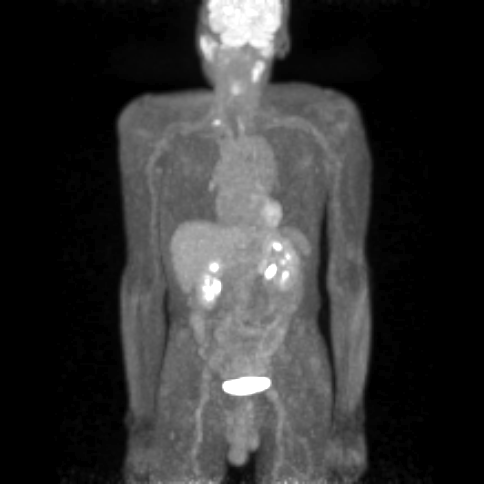

[Series 604: fused cor · 1 of 43 slices shown]
[im 1/43]
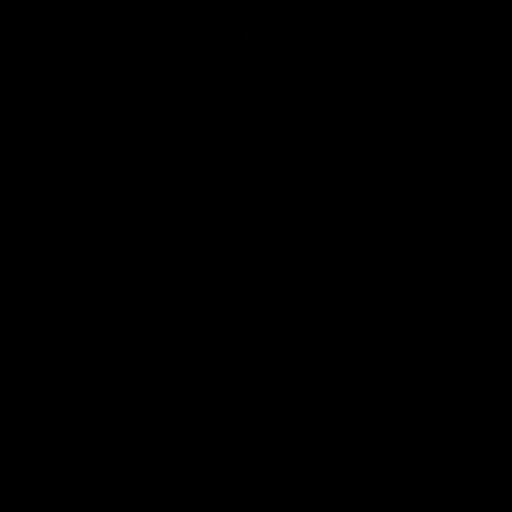

[Series 605: range-ct hn_sk_th 5.0 bf37-tra-<alpha range> · 5 of 211 slices shown]
[im 1/211]
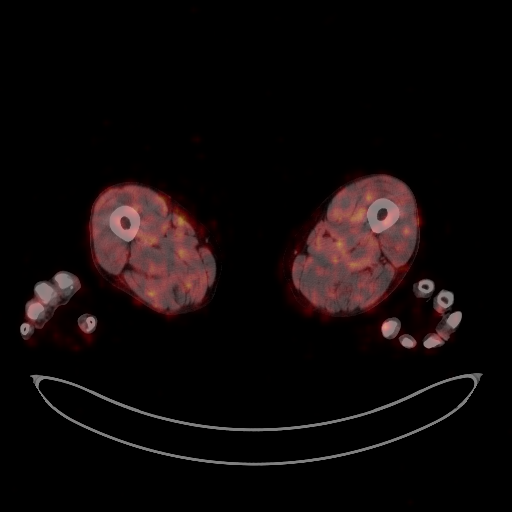
[im 53/211]
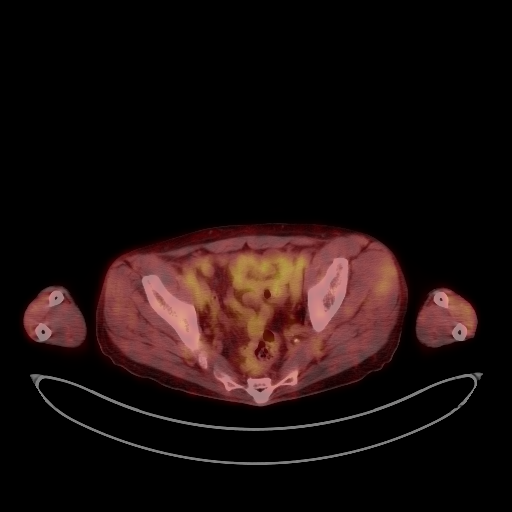
[im 106/211]
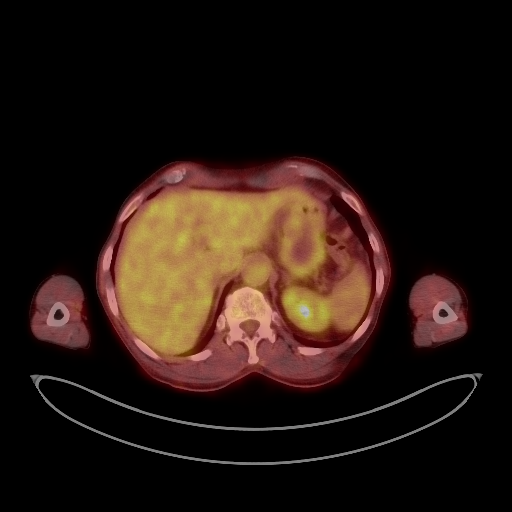
[im 158/211]
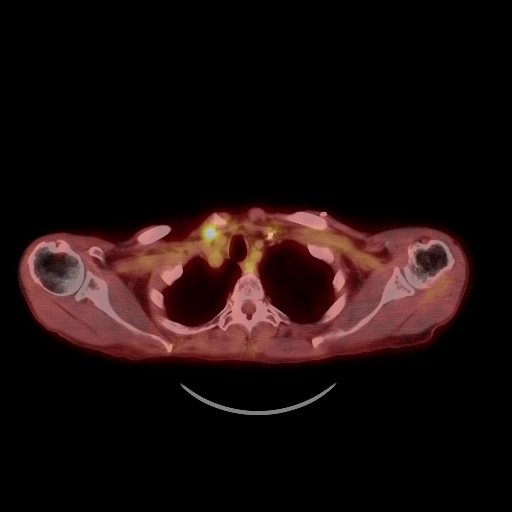
[im 211/211]
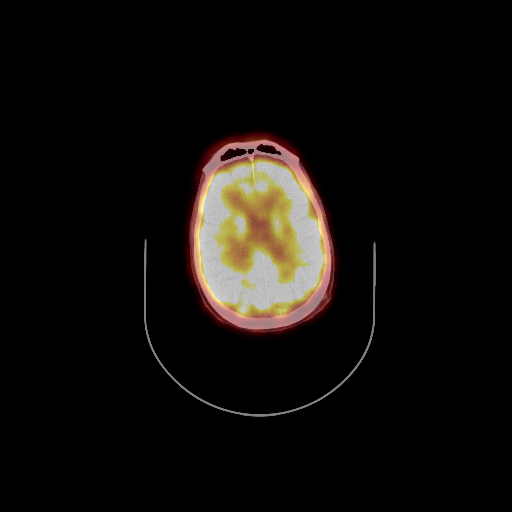

[25 of 25 positions shown; findings below may reference images not displayed]

FINDINGS: Mediastinal blood pool activity: SUV max

Liver activity: SUV max NA

NECK: Marked loss of normal tissue planes throughout the neck due to
post treatment changes. Compared to prior imaging there is near
complete resolution of hypermetabolic activity across the mucosal
surfaces of the oropharynx. Fullness of soft tissue remains without
the degree of metabolism that was seen previously. Maximum SUV for
instance at the base of the RIGHT tongue extending towards the
lateral mucosal surfaces measures 4 with respect to maximum SUV.
Previously as much as 10.6.

There is however an 11 mm lymph node, short axis measurement on
image 31 of series 4 that shows marked increased metabolism with a
maximum SUV of 8.4 at LEFT level 2.

No additional areas of increased metabolism to suggest persistent
disease in the neck, low level activity throughout the treated area
as discussed above.

Incidental CT findings: none

CHEST: RIGHT thoracic inlet just above the RIGHT clavicle (image 56
of series 4) with a hypermetabolic lymph node in this area measuring
12 mm short axis maximum SUV of 6.8 no additional hypermetabolic
lymph nodes in the chest. No suspicious pulmonary nodules.

Incidental CT findings: LEFT Port-A-Cath terminates at the caval to
atrial junction. Normal heart size. Signs of coronary artery
disease. No substantial pericardial effusion. Normal caliber of
pulmonary vasculature. Scarring in the lingula.

ABDOMEN/PELVIS: No abnormal hypermetabolic activity within the
liver, pancreas, adrenal glands, or spleen. No hypermetabolic lymph
nodes in the abdomen or pelvis.

Incidental CT findings: Sludge in the gallbladder. No acute findings
relative to liver, gallbladder, spleen, pancreas, adrenal glands and
kidneys. Urinary bladder under distended. G-tube in place. No acute
gastrointestinal findings. Colonic diverticulosis. Grossly
unremarkable appearance of reproductive structures. Aortic
atherosclerosis without aneurysm.

SKELETON: No focal hypermetabolic activity to suggest skeletal
metastasis.

Incidental CT findings: none
IMPRESSION: Findings of worsening nodal disease with a node or group of nodes in
the LEFT neck and a RIGHT supraclavicular lymph node as discussed.

Post treatment changes with soft tissue fullness in the area of the
prior tumor but with markedly diminished metabolic activity.

## 2022-01-09 ENCOUNTER — Other Ambulatory Visit (HOSPITAL_COMMUNITY): Payer: Self-pay | Admitting: Interventional Radiology

## 2022-01-09 DIAGNOSIS — R633 Feeding difficulties, unspecified: Secondary | ICD-10-CM

## 2022-01-10 ENCOUNTER — Other Ambulatory Visit (HOSPITAL_COMMUNITY): Payer: Self-pay | Admitting: Interventional Radiology

## 2022-01-10 ENCOUNTER — Ambulatory Visit (HOSPITAL_COMMUNITY)
Admission: RE | Admit: 2022-01-10 | Discharge: 2022-01-10 | Disposition: A | Discharge status: Home / Self Care | Payer: Medicare HMO | Source: Ambulatory Visit | Attending: Interventional Radiology | Admitting: Interventional Radiology

## 2022-01-10 DIAGNOSIS — R633 Feeding difficulties, unspecified: Secondary | ICD-10-CM | POA: Insufficient documentation

## 2022-01-10 DIAGNOSIS — Z431 Encounter for attention to gastrostomy: Secondary | ICD-10-CM | POA: Insufficient documentation

## 2022-01-10 HISTORY — PX: IR REPLC GASTRO/COLONIC TUBE PERCUT W/FLUORO: IMG2333

## 2022-01-10 MED ORDER — LIDOCAINE VISCOUS HCL 2 % MT SOLN
OROMUCOSAL | Status: AC
  Filled 2022-01-10: qty 15

## 2022-01-13 ENCOUNTER — Other Ambulatory Visit: Payer: Self-pay

## 2022-01-13 ENCOUNTER — Other Ambulatory Visit: Payer: Self-pay | Admitting: Hematology and Oncology

## 2022-01-13 ENCOUNTER — Inpatient Hospital Stay: Payer: Medicare HMO | Attending: Hematology and Oncology | Admitting: Hematology and Oncology

## 2022-01-13 ENCOUNTER — Inpatient Hospital Stay: Payer: Medicare HMO

## 2022-01-13 DIAGNOSIS — Z5111 Encounter for antineoplastic chemotherapy: Secondary | ICD-10-CM | POA: Insufficient documentation

## 2022-01-13 DIAGNOSIS — C321 Malignant neoplasm of supraglottis: Secondary | ICD-10-CM | POA: Insufficient documentation

## 2022-01-13 DIAGNOSIS — Z931 Gastrostomy status: Secondary | ICD-10-CM | POA: Insufficient documentation

## 2022-01-13 DIAGNOSIS — Z79899 Other long term (current) drug therapy: Secondary | ICD-10-CM | POA: Diagnosis not present

## 2022-01-13 DIAGNOSIS — G893 Neoplasm related pain (acute) (chronic): Secondary | ICD-10-CM | POA: Diagnosis not present

## 2022-01-13 DIAGNOSIS — R634 Abnormal weight loss: Secondary | ICD-10-CM

## 2022-01-13 LAB — COMPREHENSIVE METABOLIC PANEL
ALT: 9 U/L (ref 0–44)
AST: 12 U/L — ABNORMAL LOW (ref 15–41)
Albumin: 3.7 g/dL (ref 3.5–5.0)
Alkaline Phosphatase: 45 U/L (ref 38–126)
Anion gap: 5 (ref 5–15)
BUN: 12 mg/dL (ref 8–23)
CO2: 33 mmol/L — ABNORMAL HIGH (ref 22–32)
Calcium: 9.4 mg/dL (ref 8.9–10.3)
Chloride: 95 mmol/L — ABNORMAL LOW (ref 98–111)
Creatinine, Ser: 0.57 mg/dL — ABNORMAL LOW (ref 0.61–1.24)
GFR, Estimated: >60 mL/min (ref >60)
Glucose, Bld: 110 mg/dL — ABNORMAL HIGH (ref 70–99)
Potassium: 4.3 mmol/L (ref 3.5–5.1)
Sodium: 133 mmol/L — ABNORMAL LOW (ref 135–145)
Total Bilirubin: 0.4 mg/dL (ref 0.3–1.2)
Total Protein: 6.9 g/dL (ref 6.5–8.1)

## 2022-01-13 LAB — CBC WITH DIFFERENTIAL/PLATELET
Abs Immature Granulocytes: 0.02 10*3/uL (ref 0.00–0.07)
Basophils Absolute: 0 10*3/uL (ref 0.0–0.1)
Basophils Relative: 1 %
Eosinophils Absolute: 0.3 10*3/uL (ref 0.0–0.5)
Eosinophils Relative: 9 %
HCT: 25 % — ABNORMAL LOW (ref 39.0–52.0)
Hemoglobin: 8.5 g/dL — ABNORMAL LOW (ref 13.0–17.0)
Immature Granulocytes: 1 %
Lymphocytes Relative: 16 %
Lymphs Abs: 0.5 10*3/uL — ABNORMAL LOW (ref 0.7–4.0)
MCH: 32.6 pg (ref 26.0–34.0)
MCHC: 34 g/dL (ref 30.0–36.0)
MCV: 95.8 fL (ref 80.0–100.0)
Monocytes Absolute: 0.4 10*3/uL (ref 0.1–1.0)
Monocytes Relative: 13 %
Neutro Abs: 1.8 10*3/uL (ref 1.7–7.7)
Neutrophils Relative %: 60 %
Platelets: 239 10*3/uL (ref 150–400)
RBC: 2.61 MIL/uL — ABNORMAL LOW (ref 4.22–5.81)
RDW: 13 % (ref 11.5–15.5)
WBC: 3 10*3/uL — ABNORMAL LOW (ref 4.0–10.5)
nRBC: 0 % (ref 0.0–0.2)

## 2022-01-13 LAB — MAGNESIUM: Magnesium: 1.7 mg/dL (ref 1.7–2.4)

## 2022-01-13 MED ORDER — SODIUM CHLORIDE 0.9% FLUSH: 10.0000 mL | Freq: Once | INTRAVENOUS | Status: AC

## 2022-01-13 MED ORDER — HEPARIN SOD (PORK) LOCK FLUSH 100 UNIT/ML IV SOLN: 500.0000 [IU] | Freq: Once | INTRAVENOUS | Status: AC

## 2022-01-13 NOTE — Progress Notes (Signed)
[facility] Follow up: ?  ? ?[name], MD ?[address] ?[place] [address] ? ? 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 ? ?SUMMARY OF ONCOLOGIC HISTORY: ?Oncology History Overview Note  ?Recurrent SCC, p16 neg ?  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 Direct laryngoscopy w/ debulking of the tumor arising from the lateral surface of the right aryepiglottic fold and anterior/lateral aspect of the piriform sinus on the right side  ?  ?04/01/2019 Pathology Results  ? Accession: [id number] ? ?Larynx, biopsy, Right Supraglottic Tumor ?- POORLY DIFFERENTIATED SQUAMOUS CELL CARCINOMA WITH FOCAL SARCOMATOID CHANGES. SEE NOTE ?  ?04/01/2019 Surgery  ? Pre-Op Dx:   right supraglottic carcinoma ?  ?Post-op Dx: T2 N0 M0 (stage II) right supraglottic carcinoma ?  ?Proc: Direct laryngoscopy with biopsy.  Cervical esophagoscopy.  Bronchoscopy. ?  ?Surg:  [name] ?   ?Findings: A bulky necrotic and semi-pedunculated tumor coming off the lateral surface of the right aryepiglottic fold and the anterior and lateral aspect of the piriform sinus on the right side.  Airway was compromised by the tumor mass at intubation. ?  ?04/11/2019 Imaging  ? CT neck: ?IMPRESSION: ?The glottis is closed with suboptimal evaluation of the vocal cords. ?No asymmetry of the cords. Correlate with laryngoscopy results. ?  ?No enlarged lymph nodes in the neck. 7 mm right level 2 lymph node ?may be reactive. No definite pathologic lymph nodes in the neck. ?  ?04/11/2019 Imaging  ? CT neck ? ?This CT was performed after surgical debulking of an exophytic laryngeal tumor. ?  ?Most apparent on series 2, image 51 there is abnormal asymmetric  hyperenhancement along the surface of the right piriform sinus, and perhaps also involving the bilateral AE folds. ?  ?04/22/2019 Imaging  ? PET: ?IMPRESSION: ?1. Mild residual activity in the posterior RIGHT hypopharynx ?confined to the mucosa. ?2. No evidence of hypermetabolic metastatic lymph nodes in LEFT or ?RIGHT neck. ?3. No evidence distant metastatic disease. ?  ?05/02/2019 Initial Diagnosis  ? 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  ?05/12/2019 - 07/06/2019 Radiation Therapy  ? Radiation Treatment Dates: 05/12/2019 through 07/06/2019 ?Site Technique Total Dose (Gy) Dose per Fx (Gy) Completed Fx Beam Energies  ?Head & neck: HN_larynx IMRT 70/70 2 35/35 6X  ?  ? ?Radiation Treatment Dates: 09/18/2020 through 10/31/2020 ?Site Technique Total Dose (Gy) Dose per Fx (Gy) Completed Fx Beam Energies  ?Oropharynx: HN_orophar IMRT 60/60 2 30/30 6X  ?  ?  ?  ?10/27/2019 PET scan  ? 1. New FDG avid right level 2 cervical lymph node concerning for recurrent disease. No findings of distant metastatic disease within the chest, abdomen or pelvis. ?2. Mild nonspecific increased uptake in the right side of larynx is similar to previous exam. ?3. Aortic Atherosclerosis (ICD10-I70.0). Coronary artery calcifications.  ?  ?11/21/2019 Pathology Results  ? SURGICAL PATHOLOGY  ?CASE: [id number]  ?PATIENT: Ahmeer Beecham  ?Surgical Pathology Report  ? ?FINAL MICROSCOPIC DIAGNOSIS:  ? ?A. LYMPH NODE, RIGHT CERVICAL:  ?- Poorly differentiated carcinoma consistent with metastatic squamous cell carcinoma.  ?- See comment ?  ?01/06/2020 Pathology Results  ? SURGICAL PATHOLOGY  ?CASE: [id number]  ?PATIENT: Corwyn Angelo  ?Surgical Pathology Report  ? ?  Clinical History: metastatic cancer to cervical lymph nodes (cm)  ? ?FINAL MICROSCOPIC DIAGNOSIS:  ? ?A. LYMPH NODE, RIGHT MODIFIED, DISSECTION:  ?- Metastatic squamous cell carcinoma in four of nineteen lymph nodes (4/19).  ?- Largest metastatic nodule is 3.1 cm with 5 mm  extra-nodal extension.  ?- Lymphovascular space involvement in peri-nodal connective tissue.  ?- See comment.  ?  ?01/06/2020 Surgery  ? PRE-OPERATIVE DIAGNOSIS:  METASTATIC CANCER TO CERVICAL LYMPH NODES ?  ?POST-OPERATIVE DIAGNOSIS:  METASTATIC CANCER TO CERVICAL LYMPH NODES ?  ?PROCEDURE:  Procedure(s): ?DIRECT LARYNGOSCOPY ?MODIFIED RADICAL NECK DISSECTION ?  ?SURGEON:  [name], MD ?    ?SPECIMEN: Right modified neck dissection including levels 2 and 3, including the internal jugular vein, suture marks the inferior jugular vein stump. ?  ?07/05/2020 Imaging  ? 1. Sequelae of radiation and right neck dissection from the previous contrasted Neck CT. ?  ?2. Asymmetric soft tissue thickening and enhancement along the right lateral pharynx. Although this might be asymmetric mucositis, the appearance is suspicious and recommend direct inspection. NI-RADS category 2a. ?  ?3. Suspicious small 6 mm hyperenhancing nodular soft tissue along the posterior margin of the neck dissection at right level 3. NI-RADS category 2 vs 3 - although this is likely too small for imaging guided biopsy. Repeat PET-CT may be most valuable. ?  ?4. Mild inflammatory appearing right upper lobe centrilobular ground-glass opacity, new since February. Consider mild or developing right upper lobe infection. Post radiation changes to the lung apices. ?  ?  ?  ?08/13/2020 Pathology Results  ? A. OROPHARNYX, BIOPSY:  ?- Poorly differentiated squamous cell carcinoma.  ?- See comment.  ?  ?08/13/2020 Surgery  ? POST-OPERATIVE DIAGNOSIS:  Tonsillary Mass History of Laryngeal  Cancer ?  ?PROCEDURE:  Procedure(s): ?DIRECT LARYNGOSCOPY ?BIOPSY OF OROPHARYNGEAL MASS ?  ?08/30/2020 PET scan  ? 1. Recurrence of head neck carcinoma with a broad hypermetabolic pharyngeal mucosal lesion involving the RIGHT lateral posterior oropharynx and hypopharynx extending from the palatine tonsil to the vallecula. ?2. Hypermetabolic lymph node posterior  sternocleidomastoid muscle on the RIGHT (level 3). ?3. Hypermetabolic nodule within the LEFT parotid glands favored small primary parotid neoplasm. ?4. No evidence thoracic metastasis ?  ?09/24/2020 - 10/30/2020 Chemotherapy  ? He received weekly cisplatin ?  ?01/31/2021 PET scan  ? Findings of worsening nodal disease with a node or group of nodes in the LEFT neck and a RIGHT supraclavicular lymph node as discussed. ?  ?Post treatment changes with soft tissue fullness in the area of the prior tumor but with markedly diminished metabolic activity. ?  ?  ?03/11/2021 Imaging  ? Ct neck ? ?Diffuse pharyngeal and laryngeal mucosal and submucosal edema probably related to acute radiation injury. The airway is narrow and could possibly be critically narrowed. I do not see any evidence of recurrent mucosal or submucosal tumor identifiable. ?  ?Similar appearance of the recurrent malignant lymphadenopathy on the left at level 2 and level 3 and on the right in the low supraclavicular region just above and behind the clavicular head. ?  ?04/12/2021 Imaging  ? Ct abdomen and pelvis ? ?1. Contrast material in the peritoneal cavity of the upper abdomen suggesting leak along the gastrostomy tract. ?2. Gastrostomy tube is appropriately positioned. No evidence of abscess. ?3. 1.3 cm right middle lobe nodule, new since 01/31/2021. Favor infectious/inflammatory over primary/secondary neoplasm given the relatively short time frame of development. ?  ?04/12/2021 Surgery  ? Postoperative Diagnosis: Perforated duodenal ulcer (1st  portion) ?  ?Surgical Procedure:  ?Modified Graham patch of duodenal ulcer ?Gastrostomy tube exchange ?  ?Operative Team Members:  ?Surgeon(s) and Role: ?   * Stechschulte, Nickola Major, MD - Primary  ?   ?Drains:  (19 Fr) Jackson-Pratt drain(s) with closed bulb suction in the right upper quadrant near the duodenal ulcer repair and Gastrostomy Tube 26 fr ?  ?05/09/2021 Imaging  ? 1. Progression of lesion in the right tonsil  with central fluid collection and peripheral enhancement. This was a site of prior recurrent sick could represent progressive tumor or abscess. Continued follow-up recommended. ?2. Progressive malignant lymphadenopathy in the le

## 2022-01-14 ENCOUNTER — Encounter: Payer: Self-pay | Admitting: Hematology and Oncology

## 2022-01-14 ENCOUNTER — Inpatient Hospital Stay: Payer: Medicare HMO

## 2022-01-14 ENCOUNTER — Inpatient Hospital Stay: Payer: Medicare HMO | Admitting: Dietician

## 2022-01-14 VITALS — BP 104/74 | HR 74 | Temp 98.6°F | Resp 16

## 2022-01-14 DIAGNOSIS — Z5111 Encounter for antineoplastic chemotherapy: Secondary | ICD-10-CM | POA: Diagnosis not present

## 2022-01-14 DIAGNOSIS — C321 Malignant neoplasm of supraglottis: Secondary | ICD-10-CM

## 2022-01-14 MED ORDER — EMPTY CONTAINERS FLEXIBLE MISC
250.0000 mg/m2 | Freq: Once | Status: AC
  Administered 2022-01-14: 400 mg via INTRAVENOUS
  Filled 2022-01-14: qty 200

## 2022-01-14 MED ORDER — SODIUM CHLORIDE 0.9 % IV SOLN: Freq: Once | INTRAVENOUS | Status: AC

## 2022-01-14 MED ORDER — HEPARIN SOD (PORK) LOCK FLUSH 100 UNIT/ML IV SOLN
500.0000 [IU] | Freq: Once | INTRAVENOUS | Status: AC | PRN
  Administered 2022-01-14: 500 [IU]

## 2022-01-14 MED ORDER — DIPHENHYDRAMINE HCL 50 MG/ML IJ SOLN
50.0000 mg | Freq: Once | INTRAMUSCULAR | Status: AC
  Administered 2022-01-14: 50 mg via INTRAVENOUS
  Filled 2022-01-14: qty 1

## 2022-01-14 MED ORDER — SODIUM CHLORIDE 0.9% FLUSH
10.0000 mL | INTRAVENOUS | Status: DC | PRN
  Administered 2022-01-14: 10 mL

## 2022-01-14 MED ORDER — FAMOTIDINE IN NACL 20-0.9 MG/50ML-% IV SOLN
20.0000 mg | Freq: Once | INTRAVENOUS | Status: AC
  Administered 2022-01-14: 20 mg via INTRAVENOUS
  Filled 2022-01-14: qty 50

## 2022-01-14 NOTE — Assessment & Plan Note (Signed)
Mr. Daywalt is a patient with complicated history of supraglottic tumor with cancer recurrence in the head and neck region currently on weekly CarboTaxol and every 21-day Keytruda.   ?We had to hold immunotherapy for cycle 8 because of concern of pneumonitis from immunotherapy versus recovering COVID-19 infection.  However since it was thought to be grade 1, we gave him a challenge of short course of steroids as well as antibiotics for possible atypical pneumonia and repeat imaging showed recovering pneumonitis hence we have discussed about proceeding with immunotherapy. ?He completed 12 weekly cycles of carbotaxol and every 21 day keytruda ( 6 cycles) ?Most recent imaging with increasing bilateral palatine tonsil lesions and cervical lymphadenopathy ?CT chest without any concern for progression in the chest, groundglass opacities appear to be resolving consistent with infection/inflammation ? cisplatin, docetaxel and CarboTaxol, Keytruda. ? ?He went to see his medical oncologist at [facility] and recommendation was to send him back to surgery for consideration of surgical resection as well as possible tracheostomy.  From medical oncology, they have recommended considering cetuximab every week or every other week.  ? ?He is here for cycle 2-day 1 of cetuximab.  He says cetuximab was very tiring, he had a lot of fatigue, has slept most of the week.  His granddaughter Verdene Lennert states that this was probably the toughest treatment he has received so far.  He continues to use G-tube for feeding, denies any worsening pain or change in shortness of breath.  He is however willing to try more cycles of cetuximab.  We have previously discussed the PFS and overall survival benefit which is in the order of a couple months for PFS.. ? ?He will return to clinic in 1 week.  We will repeat imaging after about 6 weekly cycles of cetuximab. ?

## 2022-01-14 NOTE — Assessment & Plan Note (Signed)
He continues to use the same amount of [facility] and no significant change in weight from appearance.  We will continue to monitor this. ?

## 2022-01-14 NOTE — Progress Notes (Signed)
Nutrition Follow-up: ? ?Patient with progression. He is currently receiving cetuximab q7d for laryngeal cancer. S/p first infusion on 4/25.  ? ?Met with patient during infusion. Patient is sleepy this afternoon s/p IV Benadryl. He fell asleep multiple time during visit. Patient reports he has been giving 3 cartons of [facility] via tube. He is having occasional nausea and vomiting after bolus feeds. This started after beginning new chemotherapy. Patient unsure if he is taking nausea medication. He denies constipation and diarrhea.  ? ?Medications: compazine, zofran, roxicodone ? ?Labs: Na 133 ? ?Anthropometrics: Weight 102 lb 12.8 oz today decreased  ? ?4/24 - 108 lb 3.2 oz  ?4/04 - 103 lb 14.4 oz  ?3/01 - 102 lb 1.6 oz  ? ? ?NUTRITION DIAGNOSIS: Severe malnutrition continues ? ? ?INTERVENTION:  ?Encouraged increasing [employer] [place] to 4 cartons/day to promote weight gain ?[employer] 1.4 - give on carton 4x/day with 120 ml water flush QID + additional 480 ml water/day. Provides 1820 kcal, 80 grams protein, 1890 ml total water  ?Take nausea medication as prescribed  ? ?MONITORING, EVALUATION, GOAL: weight trends, tube feedings ? ? ?NEXT VISIT: Tuesday June 6 during infusion  ? ? ?

## 2022-01-14 NOTE — Patient Instructions (Signed)
[facility]   ?Discharge Instructions: ?Thank you for choosing [facility] to provide your oncology and hematology care.  ? ?If you have a lab appointment with the [facility], please go directly to the [facility] and check in at the registration area. ?  ?Wear comfortable clothing and clothing appropriate for easy access to any Portacath or PICC line.  ? ?We strive to give you quality time with your provider. You may need to reschedule your appointment if you arrive late (15 or more minutes).  Arriving late affects you and other patients whose appointments are after yours.  Also, if you miss three or more appointments without notifying the office, you may be dismissed from the clinic at the provider?s discretion.    ?  ?For prescription refill requests, have your pharmacy contact our office and allow 72 hours for refills to be completed.   ? ?Today you received the following chemotherapy and/or immunotherapy agents: cetuximab    ?  ?To help prevent nausea and vomiting after your treatment, we encourage you to take your nausea medication as directed. ? ?BELOW ARE SYMPTOMS THAT SHOULD BE REPORTED IMMEDIATELY: ?*FEVER GREATER THAN 100.4 F (38 ?C) OR HIGHER ?*CHILLS OR SWEATING ?*NAUSEA AND VOMITING THAT IS NOT CONTROLLED WITH YOUR NAUSEA MEDICATION ?*UNUSUAL SHORTNESS OF BREATH ?*UNUSUAL BRUISING OR BLEEDING ?*URINARY PROBLEMS (pain or burning when urinating, or frequent urination) ?*BOWEL PROBLEMS (unusual diarrhea, constipation, pain near the anus) ?TENDERNESS IN MOUTH AND THROAT WITH OR WITHOUT PRESENCE OF ULCERS (sore throat, sores in mouth, or a toothache) ?UNUSUAL RASH, SWELLING OR PAIN  ?UNUSUAL VAGINAL DISCHARGE OR ITCHING  ? ?Items with * indicate a potential emergency and should be followed up as soon as possible or go to the Emergency Department if any problems should occur. ? ?Please show the CHEMOTHERAPY ALERT CARD or IMMUNOTHERAPY ALERT CARD at check-in  to the Emergency Department and triage nurse. ? ?Should you have questions after your visit or need to cancel or reschedule your appointment, please contact [facility]  Dept: [phone]  and follow the prompts.  Office hours are 8:00 a.m. to 4:30 p.m. Monday - Friday. Please note that voicemails left after 4:00 p.m. may not be returned until the following business day.  We are closed weekends and major holidays. You have access to a nurse at all times for urgent questions. Please call the main number to the clinic Dept: [phone] and follow the prompts. ? ? ?For any non-urgent questions, you may also contact your provider using MyChart. We now offer e-Visits for anyone 66 and older to request care online for non-urgent symptoms. For details visit [email or web address]. ?  ?Also download the MyChart app! Go to the app store, search "MyChart", open the app, select [facility], and log in with your MyChart username and password. ? ?Due to Covid, a mask is required upon entering the hospital/clinic. If you do not have a mask, one will be given to you upon arrival. For doctor visits, patients may have 1 support person aged 51 or older with them. For treatment visits, patients cannot have anyone with them due to current Covid guidelines and our immunocompromised population.  ? ?

## 2022-01-14 NOTE — Assessment & Plan Note (Signed)
Stable, no change in pain.  He uses pain medication 3 times a day. ?

## 2022-01-14 NOTE — Progress Notes (Signed)
[facility] Follow up: ?  ? ?[name], MD ?[address] ?[place] [address] ? ? 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 ? ?SUMMARY OF ONCOLOGIC HISTORY: ?Oncology History Overview Note  ?Recurrent SCC, p16 neg ?  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 Direct laryngoscopy w/ debulking of the tumor arising from the lateral surface of the right aryepiglottic fold and anterior/lateral aspect of the piriform sinus on the right side  ?  ?04/01/2019 Pathology Results  ? Accession: [id number] ? ?Larynx, biopsy, Right Supraglottic Tumor ?- POORLY DIFFERENTIATED SQUAMOUS CELL CARCINOMA WITH FOCAL SARCOMATOID CHANGES. SEE NOTE ?  ?04/01/2019 Surgery  ? Pre-Op Dx:   right supraglottic carcinoma ?  ?Post-op Dx: T2 N0 M0 (stage II) right supraglottic carcinoma ?  ?Proc: Direct laryngoscopy with biopsy.  Cervical esophagoscopy.  Bronchoscopy. ?  ?Surg:  [name] ?   ?Findings: A bulky necrotic and semi-pedunculated tumor coming off the lateral surface of the right aryepiglottic fold and the anterior and lateral aspect of the piriform sinus on the right side.  Airway was compromised by the tumor mass at intubation. ?  ?04/11/2019 Imaging  ? CT neck: ?IMPRESSION: ?The glottis is closed with suboptimal evaluation of the vocal cords. ?No asymmetry of the cords. Correlate with laryngoscopy results. ?  ?No enlarged lymph nodes in the neck. 7 mm right level 2 lymph node ?may be reactive. No definite pathologic lymph nodes in the neck. ?  ?04/11/2019 Imaging  ? CT neck ? ?This CT was performed after surgical debulking of an exophytic laryngeal tumor. ?  ?Most apparent on series 2, image 51 there is abnormal asymmetric  hyperenhancement along the surface of the right piriform sinus, and perhaps also involving the bilateral AE folds. ?  ?04/22/2019 Imaging  ? PET: ?IMPRESSION: ?1. Mild residual activity in the posterior RIGHT hypopharynx ?confined to the mucosa. ?2. No evidence of hypermetabolic metastatic lymph nodes in LEFT or ?RIGHT neck. ?3. No evidence distant metastatic disease. ?  ?05/02/2019 Initial Diagnosis  ? 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  ?05/12/2019 - 07/06/2019 Radiation Therapy  ? Radiation Treatment Dates: 05/12/2019 through 07/06/2019 ?Site Technique Total Dose (Gy) Dose per Fx (Gy) Completed Fx Beam Energies  ?Head & neck: HN_larynx IMRT 70/70 2 35/35 6X  ?  ? ?Radiation Treatment Dates: 09/18/2020 through 10/31/2020 ?Site Technique Total Dose (Gy) Dose per Fx (Gy) Completed Fx Beam Energies  ?Oropharynx: HN_orophar IMRT 60/60 2 30/30 6X  ?  ?  ?  ?10/27/2019 PET scan  ? 1. New FDG avid right level 2 cervical lymph node concerning for recurrent disease. No findings of distant metastatic disease within the chest, abdomen or pelvis. ?2. Mild nonspecific increased uptake in the right side of larynx is similar to previous exam. ?3. Aortic Atherosclerosis (ICD10-I70.0). Coronary artery calcifications.  ?  ?11/21/2019 Pathology Results  ? SURGICAL PATHOLOGY  ?CASE: [id number]  ?PATIENT: Mirl Capley  ?Surgical Pathology Report  ? ?FINAL MICROSCOPIC DIAGNOSIS:  ? ?A. LYMPH NODE, RIGHT CERVICAL:  ?- Poorly differentiated carcinoma consistent with metastatic squamous cell carcinoma.  ?- See comment ?  ?01/06/2020 Pathology Results  ? SURGICAL PATHOLOGY  ?CASE: [id number]  ?PATIENT: Haruo Rizor  ?Surgical Pathology Report  ? ?  Clinical History: metastatic cancer to cervical lymph nodes (cm)  ? ?FINAL MICROSCOPIC DIAGNOSIS:  ? ?A. LYMPH NODE, RIGHT MODIFIED, DISSECTION:  ?- Metastatic squamous cell carcinoma in four of nineteen lymph nodes (4/19).  ?- Largest metastatic nodule is 3.1 cm with 5 mm  extra-nodal extension.  ?- Lymphovascular space involvement in peri-nodal connective tissue.  ?- See comment.  ?  ?01/06/2020 Surgery  ? PRE-OPERATIVE DIAGNOSIS:  METASTATIC CANCER TO CERVICAL LYMPH NODES ?  ?POST-OPERATIVE DIAGNOSIS:  METASTATIC CANCER TO CERVICAL LYMPH NODES ?  ?PROCEDURE:  Procedure(s): ?DIRECT LARYNGOSCOPY ?MODIFIED RADICAL NECK DISSECTION ?  ?SURGEON:  [name], MD ?    ?SPECIMEN: Right modified neck dissection including levels 2 and 3, including the internal jugular vein, suture marks the inferior jugular vein stump. ?  ?07/05/2020 Imaging  ? 1. Sequelae of radiation and right neck dissection from the previous contrasted Neck CT. ?  ?2. Asymmetric soft tissue thickening and enhancement along the right lateral pharynx. Although this might be asymmetric mucositis, the appearance is suspicious and recommend direct inspection. NI-RADS category 2a. ?  ?3. Suspicious small 6 mm hyperenhancing nodular soft tissue along the posterior margin of the neck dissection at right level 3. NI-RADS category 2 vs 3 - although this is likely too small for imaging guided biopsy. Repeat PET-CT may be most valuable. ?  ?4. Mild inflammatory appearing right upper lobe centrilobular ground-glass opacity, new since February. Consider mild or developing right upper lobe infection. Post radiation changes to the lung apices. ?  ?  ?  ?08/13/2020 Pathology Results  ? A. OROPHARNYX, BIOPSY:  ?- Poorly differentiated squamous cell carcinoma.  ?- See comment.  ?  ?08/13/2020 Surgery  ? POST-OPERATIVE DIAGNOSIS:  Tonsillary Mass History of Laryngeal  Cancer ?  ?PROCEDURE:  Procedure(s): ?DIRECT LARYNGOSCOPY ?BIOPSY OF OROPHARYNGEAL MASS ?  ?08/30/2020 PET scan  ? 1. Recurrence of head neck carcinoma with a broad hypermetabolic pharyngeal mucosal lesion involving the RIGHT lateral posterior oropharynx and hypopharynx extending from the palatine tonsil to the vallecula. ?2. Hypermetabolic lymph node posterior  sternocleidomastoid muscle on the RIGHT (level 3). ?3. Hypermetabolic nodule within the LEFT parotid glands favored small primary parotid neoplasm. ?4. No evidence thoracic metastasis ?  ?09/24/2020 - 10/30/2020 Chemotherapy  ? He received weekly cisplatin ?  ?01/31/2021 PET scan  ? Findings of worsening nodal disease with a node or group of nodes in the LEFT neck and a RIGHT supraclavicular lymph node as discussed. ?  ?Post treatment changes with soft tissue fullness in the area of the prior tumor but with markedly diminished metabolic activity. ?  ?  ?03/11/2021 Imaging  ? Ct neck ? ?Diffuse pharyngeal and laryngeal mucosal and submucosal edema probably related to acute radiation injury. The airway is narrow and could possibly be critically narrowed. I do not see any evidence of recurrent mucosal or submucosal tumor identifiable. ?  ?Similar appearance of the recurrent malignant lymphadenopathy on the left at level 2 and level 3 and on the right in the low supraclavicular region just above and behind the clavicular head. ?  ?04/12/2021 Imaging  ? Ct abdomen and pelvis ? ?1. Contrast material in the peritoneal cavity of the upper abdomen suggesting leak along the gastrostomy tract. ?2. Gastrostomy tube is appropriately positioned. No evidence of abscess. ?3. 1.3 cm right middle lobe nodule, new since 01/31/2021. Favor infectious/inflammatory over primary/secondary neoplasm given the relatively short time frame of development. ?  ?04/12/2021 Surgery  ? Postoperative Diagnosis: Perforated duodenal ulcer (1st  portion) ?  ?Surgical Procedure:  ?Modified Graham patch of duodenal ulcer ?Gastrostomy tube exchange ?  ?Operative Team Members:  ?Surgeon(s) and Role: ?   * Stechschulte, Nickola Major, MD - Primary  ?   ?Drains:  (19 Fr) Jackson-Pratt drain(s) with closed bulb suction in the right upper quadrant near the duodenal ulcer repair and Gastrostomy Tube 26 fr ?  ?05/09/2021 Imaging  ? 1. Progression of lesion in the right tonsil  with central fluid collection and peripheral enhancement. This was a site of prior recurrent sick could represent progressive tumor or abscess. Continued follow-up recommended. ?2. Progressive malignant lymphadenopathy in the le

## 2022-01-20 ENCOUNTER — Inpatient Hospital Stay: Payer: Medicare HMO | Admitting: Hematology and Oncology

## 2022-01-20 ENCOUNTER — Telehealth: Payer: Self-pay | Admitting: *Deleted

## 2022-01-20 ENCOUNTER — Inpatient Hospital Stay: Payer: Medicare HMO

## 2022-01-21 ENCOUNTER — Inpatient Hospital Stay: Payer: Medicare HMO

## 2022-01-22 MED ORDER — DOXYCYCLINE HYCLATE 100 MG PO TABS: 100.0000 mg | ORAL_TABLET | Freq: Every day | ORAL | 0 refills | Status: DC

## 2022-01-24 ENCOUNTER — Other Ambulatory Visit: Payer: Self-pay | Admitting: Hematology and Oncology

## 2022-01-27 ENCOUNTER — Inpatient Hospital Stay: Payer: Medicare HMO

## 2022-01-27 ENCOUNTER — Inpatient Hospital Stay (HOSPITAL_BASED_OUTPATIENT_CLINIC_OR_DEPARTMENT_OTHER): Payer: Medicare HMO | Admitting: Physician Assistant

## 2022-01-27 ENCOUNTER — Other Ambulatory Visit: Payer: Self-pay

## 2022-01-27 VITALS — BP 165/99 | HR 81 | Temp 97.5°F | Resp 18 | Wt 101.9 lb

## 2022-01-27 DIAGNOSIS — G893 Neoplasm related pain (acute) (chronic): Secondary | ICD-10-CM

## 2022-01-27 DIAGNOSIS — C321 Malignant neoplasm of supraglottis: Secondary | ICD-10-CM | POA: Diagnosis not present

## 2022-01-27 DIAGNOSIS — Z5111 Encounter for antineoplastic chemotherapy: Secondary | ICD-10-CM | POA: Diagnosis not present

## 2022-01-27 DIAGNOSIS — Z95828 Presence of other vascular implants and grafts: Secondary | ICD-10-CM

## 2022-01-27 LAB — CBC WITH DIFFERENTIAL/PLATELET
Abs Immature Granulocytes: 0.03 10*3/uL (ref 0.00–0.07)
Basophils Absolute: 0 10*3/uL (ref 0.0–0.1)
Basophils Relative: 1 %
Eosinophils Absolute: 0.2 10*3/uL (ref 0.0–0.5)
Eosinophils Relative: 7 %
HCT: 26.1 % — ABNORMAL LOW (ref 39.0–52.0)
Hemoglobin: 8.7 g/dL — ABNORMAL LOW (ref 13.0–17.0)
Immature Granulocytes: 1 %
Lymphocytes Relative: 20 %
Lymphs Abs: 0.6 10*3/uL — ABNORMAL LOW (ref 0.7–4.0)
MCH: 32.6 pg (ref 26.0–34.0)
MCHC: 33.3 g/dL (ref 30.0–36.0)
MCV: 97.8 fL (ref 80.0–100.0)
Monocytes Absolute: 0.4 10*3/uL (ref 0.1–1.0)
Monocytes Relative: 13 %
Neutro Abs: 1.9 10*3/uL (ref 1.7–7.7)
Neutrophils Relative %: 58 %
Platelets: 297 10*3/uL (ref 150–400)
RBC: 2.67 MIL/uL — ABNORMAL LOW (ref 4.22–5.81)
RDW: 14.6 % (ref 11.5–15.5)
WBC: 3.2 10*3/uL — ABNORMAL LOW (ref 4.0–10.5)
nRBC: 0 % (ref 0.0–0.2)

## 2022-01-27 LAB — COMPREHENSIVE METABOLIC PANEL
ALT: 12 U/L (ref 0–44)
AST: 20 U/L (ref 15–41)
Albumin: 3.7 g/dL (ref 3.5–5.0)
Alkaline Phosphatase: 43 U/L (ref 38–126)
Anion gap: 6 (ref 5–15)
BUN: 10 mg/dL (ref 8–23)
CO2: 31 mmol/L (ref 22–32)
Calcium: 9 mg/dL (ref 8.9–10.3)
Chloride: 98 mmol/L (ref 98–111)
Creatinine, Ser: 0.52 mg/dL — ABNORMAL LOW (ref 0.61–1.24)
GFR, Estimated: >60 mL/min (ref >60)
Glucose, Bld: 104 mg/dL — ABNORMAL HIGH (ref 70–99)
Potassium: 3.8 mmol/L (ref 3.5–5.1)
Sodium: 135 mmol/L (ref 135–145)
Total Bilirubin: 0.6 mg/dL (ref 0.3–1.2)
Total Protein: 6.8 g/dL (ref 6.5–8.1)

## 2022-01-27 LAB — MAGNESIUM: Magnesium: 1.5 mg/dL — ABNORMAL LOW (ref 1.7–2.4)

## 2022-01-27 MED ORDER — SODIUM CHLORIDE 0.9% FLUSH
10.0000 mL | Freq: Once | INTRAVENOUS | Status: AC
  Administered 2022-01-27: 10 mL

## 2022-01-27 MED ORDER — OXYCODONE HCL 5 MG PO TABS: 5.0000 mg | ORAL_TABLET | Freq: Four times a day (QID) | ORAL | 0 refills | Status: DC | PRN

## 2022-01-27 MED ORDER — SODIUM CHLORIDE 0.9% FLUSH: 10.0000 mL | INTRAVENOUS | Status: DC | PRN

## 2022-01-27 MED ORDER — EMPTY CONTAINERS FLEXIBLE MISC
250.0000 mg/m2 | Freq: Once | Status: AC
  Administered 2022-01-27: 400 mg via INTRAVENOUS
  Filled 2022-01-27: qty 200

## 2022-01-27 MED ORDER — MAGNESIUM SULFATE 2 GM/50ML IV SOLN
2.0000 g | Freq: Once | INTRAVENOUS | Status: AC
  Administered 2022-01-27: 2 g via INTRAVENOUS
  Filled 2022-01-27: qty 50

## 2022-01-27 MED ORDER — MORPHINE SULFATE (PF) 2 MG/ML IV SOLN
1.0000 mg | Freq: Once | INTRAVENOUS | Status: AC
  Administered 2022-01-27: 1 mg via INTRAVENOUS
  Filled 2022-01-27: qty 1

## 2022-01-27 MED ORDER — SODIUM CHLORIDE 0.9 % IV SOLN: Freq: Once | INTRAVENOUS | Status: AC

## 2022-01-27 MED ORDER — FAMOTIDINE IN NACL 20-0.9 MG/50ML-% IV SOLN
20.0000 mg | Freq: Once | INTRAVENOUS | Status: AC
  Administered 2022-01-27: 20 mg via INTRAVENOUS
  Filled 2022-01-27: qty 50

## 2022-01-27 MED ORDER — HEPARIN SOD (PORK) LOCK FLUSH 100 UNIT/ML IV SOLN: 500.0000 [IU] | Freq: Once | INTRAVENOUS | Status: DC | PRN

## 2022-01-27 MED ORDER — DIPHENHYDRAMINE HCL 50 MG/ML IJ SOLN
50.0000 mg | Freq: Once | INTRAMUSCULAR | Status: AC
  Administered 2022-01-27: 50 mg via INTRAVENOUS
  Filled 2022-01-27: qty 1

## 2022-01-27 NOTE — Progress Notes (Signed)
[facility] Follow up: ?  ? ?[name], MD ?[address] ?[place] [address] ? ? 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 ? ?SUMMARY OF ONCOLOGIC HISTORY: ?Oncology History Overview Note  ?Recurrent SCC, p16 neg ?  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 Direct laryngoscopy w/ debulking of the tumor arising from the lateral surface of the right aryepiglottic fold and anterior/lateral aspect of the piriform sinus on the right side  ?  ?04/01/2019 Pathology Results  ? Accession: [id number] ? ?Larynx, biopsy, Right Supraglottic Tumor ?- POORLY DIFFERENTIATED SQUAMOUS CELL CARCINOMA WITH FOCAL SARCOMATOID CHANGES. SEE NOTE ?  ?04/01/2019 Surgery  ? Pre-Op Dx:   right supraglottic carcinoma ?  ?Post-op Dx: T2 N0 M0 (stage II) right supraglottic carcinoma ?  ?Proc: Direct laryngoscopy with biopsy.  Cervical esophagoscopy.  Bronchoscopy. ?  ?Surg:  [name] ?   ?Findings: A bulky necrotic and semi-pedunculated tumor coming off the lateral surface of the right aryepiglottic fold and the anterior and lateral aspect of the piriform sinus on the right side.  Airway was compromised by the tumor mass at intubation. ?  ?04/11/2019 Imaging  ? CT neck: ?IMPRESSION: ?The glottis is closed with suboptimal evaluation of the vocal cords. ?No asymmetry of the cords. Correlate with laryngoscopy results. ?  ?No enlarged lymph nodes in the neck. 7 mm right level 2 lymph node ?may be reactive. No definite pathologic lymph nodes in the neck. ?  ?04/11/2019 Imaging  ? CT neck ? ?This CT was performed after surgical debulking of an exophytic laryngeal tumor. ?  ?Most apparent on series 2, image 51 there is abnormal asymmetric  hyperenhancement along the surface of the right piriform sinus, and perhaps also involving the bilateral AE folds. ?  ?04/22/2019 Imaging  ? PET: ?IMPRESSION: ?1. Mild residual activity in the posterior RIGHT hypopharynx ?confined to the mucosa. ?2. No evidence of hypermetabolic metastatic lymph nodes in LEFT or ?RIGHT neck. ?3. No evidence distant metastatic disease. ?  ?05/02/2019 Initial Diagnosis  ? 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  ?05/12/2019 - 07/06/2019 Radiation Therapy  ? Radiation Treatment Dates: 05/12/2019 through 07/06/2019 ?Site Technique Total Dose (Gy) Dose per Fx (Gy) Completed Fx Beam Energies  ?Head & neck: HN_larynx IMRT 70/70 2 35/35 6X  ?  ? ?Radiation Treatment Dates: 09/18/2020 through 10/31/2020 ?Site Technique Total Dose (Gy) Dose per Fx (Gy) Completed Fx Beam Energies  ?Oropharynx: HN_orophar IMRT 60/60 2 30/30 6X  ?  ?  ?  ?10/27/2019 PET scan  ? 1. New FDG avid right level 2 cervical lymph node concerning for recurrent disease. No findings of distant metastatic disease within the chest, abdomen or pelvis. ?2. Mild nonspecific increased uptake in the right side of larynx is similar to previous exam. ?3. Aortic Atherosclerosis (ICD10-I70.0). Coronary artery calcifications.  ?  ?11/21/2019 Pathology Results  ? SURGICAL PATHOLOGY  ?CASE: [id number]  ?[name]  ?Surgical Pathology Report  ? ?FINAL MICROSCOPIC DIAGNOSIS:  ? ?A. LYMPH NODE, RIGHT CERVICAL:  ?- Poorly differentiated carcinoma consistent with metastatic squamous cell carcinoma.  ?- See comment ?  ?01/06/2020 Pathology Results  ? SURGICAL PATHOLOGY  ?CASE: [id number]  ?[name]  ?Surgical Pathology Report  ? ?  Clinical History: metastatic cancer to cervical lymph nodes (cm)  ? ?FINAL MICROSCOPIC DIAGNOSIS:  ? ?A. LYMPH NODE, RIGHT MODIFIED, DISSECTION:  ?- Metastatic squamous cell carcinoma in four of nineteen lymph nodes (4/19).  ?- Largest metastatic nodule is 3.1 cm with 5 mm  extra-nodal extension.  ?- Lymphovascular space involvement in peri-nodal connective tissue.  ?- See comment.  ?  ?01/06/2020 Surgery  ? PRE-OPERATIVE DIAGNOSIS:  METASTATIC CANCER TO CERVICAL LYMPH NODES ?  ?POST-OPERATIVE DIAGNOSIS:  METASTATIC CANCER TO CERVICAL LYMPH NODES ?  ?PROCEDURE:  Procedure(s): ?DIRECT LARYNGOSCOPY ?MODIFIED RADICAL NECK DISSECTION ?  ?SURGEON:  [name], MD ?    ?SPECIMEN: Right modified neck dissection including levels 2 and 3, including the internal jugular vein, suture marks the inferior jugular vein stump. ?  ?07/05/2020 Imaging  ? 1. Sequelae of radiation and right neck dissection from the previous contrasted Neck CT. ?  ?2. Asymmetric soft tissue thickening and enhancement along the right lateral pharynx. Although this might be asymmetric mucositis, the appearance is suspicious and recommend direct inspection. NI-RADS category 2a. ?  ?3. Suspicious small 6 mm hyperenhancing nodular soft tissue along the posterior margin of the neck dissection at right level 3. NI-RADS category 2 vs 3 - although this is likely too small for imaging guided biopsy. Repeat PET-CT may be most valuable. ?  ?4. Mild inflammatory appearing right upper lobe centrilobular ground-glass opacity, new since February. Consider mild or developing right upper lobe infection. Post radiation changes to the lung apices. ?  ?  ?  ?08/13/2020 Pathology Results  ? A. OROPHARNYX, BIOPSY:  ?- Poorly differentiated squamous cell carcinoma.  ?- See comment.  ?  ?08/13/2020 Surgery  ? POST-OPERATIVE DIAGNOSIS:  Tonsillary Mass History of Laryngeal  Cancer ?  ?PROCEDURE:  Procedure(s): ?DIRECT LARYNGOSCOPY ?BIOPSY OF OROPHARYNGEAL MASS ?  ?08/30/2020 PET scan  ? 1. Recurrence of head neck carcinoma with a broad hypermetabolic pharyngeal mucosal lesion involving the RIGHT lateral posterior oropharynx and hypopharynx extending from the palatine tonsil to the vallecula. ?2. Hypermetabolic lymph node posterior  sternocleidomastoid muscle on the RIGHT (level 3). ?3. Hypermetabolic nodule within the LEFT parotid glands favored small primary parotid neoplasm. ?4. No evidence thoracic metastasis ?  ?09/24/2020 - 10/30/2020 Chemotherapy  ? He received weekly cisplatin ?  ?01/31/2021 PET scan  ? Findings of worsening nodal disease with a node or group of nodes in the LEFT neck and a RIGHT supraclavicular lymph node as discussed. ?  ?Post treatment changes with soft tissue fullness in the area of the prior tumor but with markedly diminished metabolic activity. ?  ?  ?03/11/2021 Imaging  ? Ct neck ? ?Diffuse pharyngeal and laryngeal mucosal and submucosal edema probably related to acute radiation injury. The airway is narrow and could possibly be critically narrowed. I do not see any evidence of recurrent mucosal or submucosal tumor identifiable. ?  ?Similar appearance of the recurrent malignant lymphadenopathy on the left at level 2 and level 3 and on the right in the low supraclavicular region just above and behind the clavicular head. ?  ?04/12/2021 Imaging  ? Ct abdomen and pelvis ? ?1. Contrast material in the peritoneal cavity of the upper abdomen suggesting leak along the gastrostomy tract. ?2. Gastrostomy tube is appropriately positioned. No evidence of abscess. ?3. 1.3 cm right middle lobe nodule, new since 01/31/2021. Favor infectious/inflammatory over primary/secondary neoplasm given the relatively short time frame of development. ?  ?04/12/2021 Surgery  ? Postoperative Diagnosis: Perforated duodenal ulcer (1st  portion) ?  ?Surgical Procedure:  ?Modified Graham patch of duodenal ulcer ?Gastrostomy tube exchange ?  ?Operative Team Members:  ?Surgeon(s) and Role: ?   * Stechschulte, Nickola Major, MD - Primary  ?   ?Drains:  (19 Fr) Jackson-Pratt drain(s) with closed bulb suction in the right upper quadrant near the duodenal ulcer repair and Gastrostomy Tube 26 fr ?  ?05/09/2021 Imaging  ? 1. Progression of lesion in the right tonsil  with central fluid collection and peripheral enhancement. This was a site of prior recurrent sick could represent progressive tumor or abscess. Continued follow-up recommended. ?2. Progressive malignant lymphadenopathy in the le

## 2022-01-27 NOTE — Patient Instructions (Signed)
[facility]  Discharge Instructions: ?Thank you for choosing [facility] to provide your oncology and hematology care.  ? ?If you have a lab appointment with the [facility], please go directly to the [facility] and check in at the registration area. ?  ?Wear comfortable clothing and clothing appropriate for easy access to any Portacath or PICC line.  ? ?We strive to give you quality time with your provider. You may need to reschedule your appointment if you arrive late (15 or more minutes).  Arriving late affects you and other patients whose appointments are after yours.  Also, if you miss three or more appointments without notifying the office, you may be dismissed from the clinic at the provider?s discretion.    ?  ?For prescription refill requests, have your pharmacy contact our office and allow 72 hours for refills to be completed.   ? ?Today you received the following chemotherapy and/or immunotherapy agents: Cetuximab.     ?  ?To help prevent nausea and vomiting after your treatment, we encourage you to take your nausea medication as directed. ? ?BELOW ARE SYMPTOMS THAT SHOULD BE REPORTED IMMEDIATELY: ?*FEVER GREATER THAN 100.4 F (38 ?C) OR HIGHER ?*CHILLS OR SWEATING ?*NAUSEA AND VOMITING THAT IS NOT CONTROLLED WITH YOUR NAUSEA MEDICATION ?*UNUSUAL SHORTNESS OF BREATH ?*UNUSUAL BRUISING OR BLEEDING ?*URINARY PROBLEMS (pain or burning when urinating, or frequent urination) ?*BOWEL PROBLEMS (unusual diarrhea, constipation, pain near the anus) ?TENDERNESS IN MOUTH AND THROAT WITH OR WITHOUT PRESENCE OF ULCERS (sore throat, sores in mouth, or a toothache) ?UNUSUAL RASH, SWELLING OR PAIN  ?UNUSUAL VAGINAL DISCHARGE OR ITCHING  ? ?Items with * indicate a potential emergency and should be followed up as soon as possible or go to the Emergency Department if any problems should occur. ? ?Please show the CHEMOTHERAPY ALERT CARD or IMMUNOTHERAPY ALERT CARD at check-in  to the Emergency Department and triage nurse. ? ?Should you have questions after your visit or need to cancel or reschedule your appointment, please contact [facility]  Dept: [phone]  and follow the prompts.  Office hours are 8:00 a.m. to 4:30 p.m. Monday - Friday. Please note that voicemails left after 4:00 p.m. may not be returned until the following business day.  We are closed weekends and major holidays. You have access to a nurse at all times for urgent questions. Please call the main number to the clinic Dept: [phone] and follow the prompts. ? ? ?For any non-urgent questions, you may also contact your provider using MyChart. We now offer e-Visits for anyone 50 and older to request care online for non-urgent symptoms. For details visit [email or web address]. ?  ?Also download the MyChart app! Go to the app store, search "MyChart", open the app, select [facility], and log in with your MyChart username and password. ? ?Due to Covid, a mask is required upon entering the hospital/clinic. If you do not have a mask, one will be given to you upon arrival. For doctor visits, patients may have 1 support person aged 45 or older with them. For treatment visits, patients cannot have anyone with them due to current Covid guidelines and our immunocompromised population.  ? ?

## 2022-01-31 ENCOUNTER — Encounter: Payer: Self-pay | Admitting: Dietician

## 2022-01-31 NOTE — Progress Notes (Signed)
Provided one complimentary case of [place] 1.4

## 2022-02-04 ENCOUNTER — Other Ambulatory Visit: Payer: Self-pay

## 2022-02-04 ENCOUNTER — Inpatient Hospital Stay: Payer: Medicare HMO

## 2022-02-04 VITALS — BP 107/75 | HR 52 | Temp 97.7°F | Resp 18 | Ht 67.0 in | Wt 100.5 lb

## 2022-02-04 DIAGNOSIS — C321 Malignant neoplasm of supraglottis: Secondary | ICD-10-CM

## 2022-02-04 DIAGNOSIS — Z95828 Presence of other vascular implants and grafts: Secondary | ICD-10-CM

## 2022-02-04 DIAGNOSIS — Z5111 Encounter for antineoplastic chemotherapy: Secondary | ICD-10-CM | POA: Diagnosis not present

## 2022-02-04 LAB — CBC WITH DIFFERENTIAL/PLATELET
Abs Immature Granulocytes: 0.05 10*3/uL (ref 0.00–0.07)
Basophils Absolute: 0 10*3/uL (ref 0.0–0.1)
Basophils Relative: 1 %
Eosinophils Absolute: 0.1 10*3/uL (ref 0.0–0.5)
Eosinophils Relative: 2 %
HCT: 24.9 % — ABNORMAL LOW (ref 39.0–52.0)
Hemoglobin: 8.2 g/dL — ABNORMAL LOW (ref 13.0–17.0)
Immature Granulocytes: 1 %
Lymphocytes Relative: 18 %
Lymphs Abs: 0.8 10*3/uL (ref 0.7–4.0)
MCH: 33.3 pg (ref 26.0–34.0)
MCHC: 32.9 g/dL (ref 30.0–36.0)
MCV: 101.2 fL — ABNORMAL HIGH (ref 80.0–100.0)
Monocytes Absolute: 0.5 10*3/uL (ref 0.1–1.0)
Monocytes Relative: 11 %
Neutro Abs: 2.9 10*3/uL (ref 1.7–7.7)
Neutrophils Relative %: 67 %
Platelets: 235 10*3/uL (ref 150–400)
RBC: 2.46 MIL/uL — ABNORMAL LOW (ref 4.22–5.81)
RDW: 14.5 % (ref 11.5–15.5)
WBC: 4.3 10*3/uL (ref 4.0–10.5)
nRBC: 0 % (ref 0.0–0.2)

## 2022-02-04 LAB — COMPREHENSIVE METABOLIC PANEL
ALT: 10 U/L (ref 0–44)
AST: 14 U/L — ABNORMAL LOW (ref 15–41)
Albumin: 3.5 g/dL (ref 3.5–5.0)
Alkaline Phosphatase: 38 U/L (ref 38–126)
Anion gap: 3 — ABNORMAL LOW (ref 5–15)
BUN: 22 mg/dL (ref 8–23)
CO2: 32 mmol/L (ref 22–32)
Calcium: 8.9 mg/dL (ref 8.9–10.3)
Chloride: 101 mmol/L (ref 98–111)
Creatinine, Ser: 0.61 mg/dL (ref 0.61–1.24)
GFR, Estimated: >60 mL/min (ref >60)
Glucose, Bld: 110 mg/dL — ABNORMAL HIGH (ref 70–99)
Potassium: 4.1 mmol/L (ref 3.5–5.1)
Sodium: 136 mmol/L (ref 135–145)
Total Bilirubin: 0.3 mg/dL (ref 0.3–1.2)
Total Protein: 6.6 g/dL (ref 6.5–8.1)

## 2022-02-04 LAB — MAGNESIUM: Magnesium: 1.8 mg/dL (ref 1.7–2.4)

## 2022-02-04 MED ORDER — SODIUM CHLORIDE 0.9% FLUSH
10.0000 mL | Freq: Once | INTRAVENOUS | Status: AC
  Administered 2022-02-04: 10 mL

## 2022-02-04 MED ORDER — EMPTY CONTAINERS FLEXIBLE MISC
250.0000 mg/m2 | Freq: Once | Status: AC
  Administered 2022-02-04: 400 mg via INTRAVENOUS
  Filled 2022-02-04: qty 200

## 2022-02-04 MED ORDER — SODIUM CHLORIDE 0.9% FLUSH
10.0000 mL | INTRAVENOUS | Status: DC | PRN
  Administered 2022-02-04: 10 mL

## 2022-02-04 MED ORDER — DIPHENHYDRAMINE HCL 50 MG/ML IJ SOLN
50.0000 mg | Freq: Once | INTRAMUSCULAR | Status: AC
  Administered 2022-02-04: 50 mg via INTRAVENOUS

## 2022-02-04 MED ORDER — SODIUM CHLORIDE 0.9 % IV SOLN: Freq: Once | INTRAVENOUS | Status: AC

## 2022-02-04 MED ORDER — FAMOTIDINE IN NACL 20-0.9 MG/50ML-% IV SOLN
20.0000 mg | Freq: Once | INTRAVENOUS | Status: AC
  Administered 2022-02-04: 20 mg via INTRAVENOUS

## 2022-02-04 MED ORDER — HEPARIN SOD (PORK) LOCK FLUSH 100 UNIT/ML IV SOLN
500.0000 [IU] | Freq: Once | INTRAVENOUS | Status: AC | PRN
  Administered 2022-02-04: 500 [IU]

## 2022-02-04 NOTE — Patient Instructions (Signed)
[facility]  Discharge Instructions: Thank you for choosing [facility] to provide your oncology and hematology care.   If you have a lab appointment with the [facility], please go directly to the [facility] and check in at the registration area.   Wear comfortable clothing and clothing appropriate for easy access to any Portacath or PICC line.   We strive to give you quality time with your provider. You may need to reschedule your appointment if you arrive late (15 or more minutes).  Arriving late affects you and other patients whose appointments are after yours.  Also, if you miss three or more appointments without notifying the office, you may be dismissed from the clinic at the provider's discretion.      For prescription refill requests, have your pharmacy contact our office and allow 72 hours for refills to be completed.    Today you received the following chemotherapy and/or immunotherapy agents: Cetuximab.       To help prevent nausea and vomiting after your treatment, we encourage you to take your nausea medication as directed.  BELOW ARE SYMPTOMS THAT SHOULD BE REPORTED IMMEDIATELY: *FEVER GREATER THAN 100.4 F (38 C) OR HIGHER *CHILLS OR SWEATING *NAUSEA AND VOMITING THAT IS NOT CONTROLLED WITH YOUR NAUSEA MEDICATION *UNUSUAL SHORTNESS OF BREATH *UNUSUAL BRUISING OR BLEEDING *URINARY PROBLEMS (pain or burning when urinating, or frequent urination) *BOWEL PROBLEMS (unusual diarrhea, constipation, pain near the anus) TENDERNESS IN MOUTH AND THROAT WITH OR WITHOUT PRESENCE OF ULCERS (sore throat, sores in mouth, or a toothache) UNUSUAL RASH, SWELLING OR PAIN  UNUSUAL VAGINAL DISCHARGE OR ITCHING   Items with * indicate a potential emergency and should be followed up as soon as possible or go to the Emergency Department if any problems should occur.  Please show the CHEMOTHERAPY ALERT CARD or IMMUNOTHERAPY ALERT CARD at check-in  to the Emergency Department and triage nurse.  Should you have questions after your visit or need to cancel or reschedule your appointment, please contact [facility]  Dept: [phone]  and follow the prompts.  Office hours are 8:00 a.m. to 4:30 p.m. Monday - Friday. Please note that voicemails left after 4:00 p.m. may not be returned until the following business day.  We are closed weekends and major holidays. You have access to a nurse at all times for urgent questions. Please call the main number to the clinic Dept: [phone] and follow the prompts.   For any non-urgent questions, you may also contact your provider using MyChart. We now offer e-Visits for anyone 24 and older to request care online for non-urgent symptoms. For details visit [email or web address].   Also download the MyChart app! Go to the app store, search "MyChart", open the app, select [facility], and log in with your MyChart username and password.  Due to Covid, a mask is required upon entering the hospital/clinic. If you do not have a mask, one will be given to you upon arrival. For doctor visits, patients may have 1 support person aged 11 or older with them. For treatment visits, patients cannot have anyone with them due to current Covid guidelines and our immunocompromised population.

## 2022-02-11 ENCOUNTER — Inpatient Hospital Stay (HOSPITAL_BASED_OUTPATIENT_CLINIC_OR_DEPARTMENT_OTHER): Payer: Medicare HMO | Admitting: Hematology and Oncology

## 2022-02-11 ENCOUNTER — Inpatient Hospital Stay: Payer: Medicare HMO

## 2022-02-11 ENCOUNTER — Encounter: Payer: Self-pay | Admitting: Hematology and Oncology

## 2022-02-11 ENCOUNTER — Other Ambulatory Visit: Payer: Self-pay

## 2022-02-11 VITALS — BP 159/92 | HR 82 | Temp 97.9°F | Resp 16 | Ht 67.0 in | Wt 104.0 lb

## 2022-02-11 DIAGNOSIS — Z95828 Presence of other vascular implants and grafts: Secondary | ICD-10-CM

## 2022-02-11 DIAGNOSIS — C090 Malignant neoplasm of tonsillar fossa: Secondary | ICD-10-CM

## 2022-02-11 DIAGNOSIS — C321 Malignant neoplasm of supraglottis: Secondary | ICD-10-CM

## 2022-02-11 DIAGNOSIS — Z5111 Encounter for antineoplastic chemotherapy: Secondary | ICD-10-CM | POA: Diagnosis not present

## 2022-02-11 DIAGNOSIS — C329 Malignant neoplasm of larynx, unspecified: Secondary | ICD-10-CM

## 2022-02-11 LAB — COMPREHENSIVE METABOLIC PANEL
ALT: 9 U/L (ref 0–44)
AST: 13 U/L — ABNORMAL LOW (ref 15–41)
Albumin: 3.8 g/dL (ref 3.5–5.0)
Alkaline Phosphatase: 39 U/L (ref 38–126)
Anion gap: 6 (ref 5–15)
BUN: 12 mg/dL (ref 8–23)
CO2: 31 mmol/L (ref 22–32)
Calcium: 9.5 mg/dL (ref 8.9–10.3)
Chloride: 97 mmol/L — ABNORMAL LOW (ref 98–111)
Creatinine, Ser: 0.52 mg/dL — ABNORMAL LOW (ref 0.61–1.24)
GFR, Estimated: >60 mL/min (ref >60)
Glucose, Bld: 103 mg/dL — ABNORMAL HIGH (ref 70–99)
Potassium: 3.7 mmol/L (ref 3.5–5.1)
Sodium: 134 mmol/L — ABNORMAL LOW (ref 135–145)
Total Bilirubin: 0.4 mg/dL (ref 0.3–1.2)
Total Protein: 7.1 g/dL (ref 6.5–8.1)

## 2022-02-11 LAB — CBC WITH DIFFERENTIAL/PLATELET
Abs Immature Granulocytes: 0.04 10*3/uL (ref 0.00–0.07)
Basophils Absolute: 0.1 10*3/uL (ref 0.0–0.1)
Basophils Relative: 1 %
Eosinophils Absolute: 0.2 10*3/uL (ref 0.0–0.5)
Eosinophils Relative: 3 %
HCT: 27.7 % — ABNORMAL LOW (ref 39.0–52.0)
Hemoglobin: 9 g/dL — ABNORMAL LOW (ref 13.0–17.0)
Immature Granulocytes: 1 %
Lymphocytes Relative: 20 %
Lymphs Abs: 1 10*3/uL (ref 0.7–4.0)
MCH: 32.4 pg (ref 26.0–34.0)
MCHC: 32.5 g/dL (ref 30.0–36.0)
MCV: 99.6 fL (ref 80.0–100.0)
Monocytes Absolute: 0.4 10*3/uL (ref 0.1–1.0)
Monocytes Relative: 7 %
Neutro Abs: 3.4 10*3/uL (ref 1.7–7.7)
Neutrophils Relative %: 68 %
Platelets: 255 10*3/uL (ref 150–400)
RBC: 2.78 MIL/uL — ABNORMAL LOW (ref 4.22–5.81)
RDW: 13.4 % (ref 11.5–15.5)
WBC: 5 10*3/uL (ref 4.0–10.5)
nRBC: 0 % (ref 0.0–0.2)

## 2022-02-11 MED ORDER — HEPARIN SOD (PORK) LOCK FLUSH 100 UNIT/ML IV SOLN
500.0000 [IU] | Freq: Once | INTRAVENOUS | Status: AC
  Administered 2022-02-11: 500 [IU]

## 2022-02-11 MED ORDER — SODIUM CHLORIDE 0.9% FLUSH
10.0000 mL | Freq: Once | INTRAVENOUS | Status: AC
  Administered 2022-02-11: 10 mL

## 2022-02-11 MED ORDER — PROCHLORPERAZINE MALEATE 10 MG PO TABS: 10.0000 mg | ORAL_TABLET | Freq: Four times a day (QID) | ORAL | 1 refills | Status: AC | PRN

## 2022-02-11 NOTE — Progress Notes (Signed)
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 SEE NOTE   04/01/2019 Surgery   Pre-Op Dx:   right supraglottic carcinoma   Post-op Dx: T2 N0 M0 (stage II) right supraglottic carcinoma   Proc: Direct laryngoscopy with biopsy.  Cervical esophagoscopy.  Bronchoscopy.   Surg:  [name]    Findings: A bulky necrotic and semi-pedunculated tumor coming off the lateral surface of the right aryepiglottic fold and the anterior and lateral aspect of the piriform sinus on the right side.  Airway was compromised by the tumor mass at intubation.   04/11/2019 Imaging   CT neck: IMPRESSION: The glottis is closed with suboptimal evaluation of the vocal cords. No asymmetry of the cords. Correlate with laryngoscopy results.   No enlarged lymph nodes in the neck. 7 mm right level 2 lymph node may be reactive. No definite pathologic lymph nodes in the neck.   04/11/2019 Imaging   CT neck  This CT was performed after surgical debulking of an exophytic laryngeal tumor.   Most apparent on series 2, image 51 there is abnormal asymmetric  hyperenhancement along the surface of the right piriform sinus, and perhaps also involving the bilateral AE folds.   04/22/2019 Imaging   PET: IMPRESSION: 1. Mild residual activity in the posterior RIGHT hypopharynx confined to the mucosa. 2. No evidence of hypermetabolic metastatic lymph nodes in LEFT or RIGHT neck. 3. No evidence distant metastatic disease.   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 New FDG avid right level 2 cervical lymph node concerning for recurrent disease. No findings of distant metastatic disease within the chest, abdomen or pelvis. 2. Mild nonspecific increased uptake in the right side of larynx is similar to previous exam. 3. Aortic Atherosclerosis (ICD10-I70.0). Coronary artery calcifications.    11/21/2019 Pathology Results   SURGICAL PATHOLOGY  CASE: [id number]  [name]  Surgical Pathology Report   FINAL MICROSCOPIC DIAGNOSIS:   A. LYMPH NODE, RIGHT CERVICAL:  - Poorly differentiated carcinoma consistent with metastatic squamous cell carcinoma.  - See comment   01/06/2020 Pathology Results   SURGICAL PATHOLOGY  CASE: [id number]  [name]  Surgical Pathology Report  Clinical History: metastatic cancer to cervical lymph nodes (cm)   FINAL MICROSCOPIC DIAGNOSIS:   A. LYMPH NODE, RIGHT MODIFIED, DISSECTION:  - Metastatic squamous cell carcinoma in four of nineteen lymph nodes (4/19).  - Largest metastatic nodule is 3.1 cm with 5 mm  extra-nodal extension.  - Lymphovascular space involvement in peri-nodal connective tissue.  - See comment.    01/06/2020 Surgery   PRE-OPERATIVE DIAGNOSIS:  METASTATIC CANCER TO CERVICAL LYMPH NODES   POST-OPERATIVE DIAGNOSIS:  METASTATIC CANCER TO CERVICAL LYMPH NODES   PROCEDURE:  Procedure(s): DIRECT LARYNGOSCOPY MODIFIED RADICAL NECK DISSECTION   SURGEON:  [name], MD     SPECIMEN: Right modified neck dissection including levels 2 and 3, including the internal jugular vein, suture marks the inferior jugular vein stump.   07/05/2020 Imaging   1. Sequelae of radiation and right neck dissection from the previous contrasted Neck CT.   2. Asymmetric soft tissue thickening and enhancement along the right lateral pharynx. Although this might be asymmetric mucositis, the appearance is suspicious and recommend direct inspection. NI-RADS category 2a.   3. Suspicious small 6 mm hyperenhancing nodular soft tissue along the posterior margin of the neck dissection at right level 3. NI-RADS category 2 vs 3 - although this is likely too small for imaging guided biopsy. Repeat PET-CT may be most valuable.   4. Mild inflammatory appearing right upper lobe centrilobular ground-glass opacity, new since February. Consider mild or developing right upper lobe infection. Post radiation changes to the lung apices.       08/13/2020 Pathology Results   A. OROPHARNYX, BIOPSY:  - Poorly differentiated squamous cell carcinoma.  - See comment.    08/13/2020 Surgery   POST-OPERATIVE DIAGNOSIS:  Tonsillary Mass History of Laryngeal  Cancer   PROCEDURE:  Procedure(s): DIRECT LARYNGOSCOPY BIOPSY OF OROPHARYNGEAL MASS   08/30/2020 PET scan   1. Recurrence of head neck carcinoma with a broad hypermetabolic pharyngeal mucosal lesion involving the RIGHT lateral posterior oropharynx and hypopharynx extending from the palatine tonsil to the vallecula. 2. Hypermetabolic lymph node posterior  sternocleidomastoid muscle on the RIGHT (level 3). 3. Hypermetabolic nodule within the LEFT parotid glands favored small primary parotid neoplasm. 4. No evidence thoracic metastasis   09/24/2020 - 10/30/2020 Chemotherapy   He received weekly cisplatin   01/31/2021 PET scan   Findings of worsening nodal disease with a node or group of nodes in the LEFT neck and a RIGHT supraclavicular lymph node as discussed.   Post treatment changes with soft tissue fullness in the area of the prior tumor but with markedly diminished metabolic activity.     03/11/2021 Imaging   Ct neck  Diffuse pharyngeal and laryngeal mucosal and submucosal edema probably related to acute radiation injury. The airway is narrow and could possibly be critically narrowed. I do not see any evidence of recurrent mucosal or submucosal tumor identifiable.   Similar appearance of the recurrent malignant lymphadenopathy on the left at level 2 and level 3 and on the right in the low supraclavicular region just above and behind the clavicular head.   04/12/2021 Imaging   Ct abdomen and pelvis  1. Contrast material in the peritoneal cavity of the upper abdomen suggesting leak along the gastrostomy tract. 2. Gastrostomy tube is appropriately positioned. No evidence of abscess. 3. 1.3 cm right middle lobe nodule, new since 01/31/2021. Favor infectious/inflammatory over primary/secondary neoplasm given the relatively short time frame of development.   04/12/2021 Surgery   Postoperative Diagnosis: Perforated duodenal ulcer (1st  portion)   Surgical Procedure:  Modified Graham patch of duodenal ulcer Gastrostomy tube exchange   Operative Team Members:  Surgeon(s) and Role:    * Stechschulte, Nickola Major, MD - Primary     Drains:  (19 Fr) Jackson-Pratt drain(s) with closed bulb suction in the right upper quadrant near the duodenal ulcer repair and Gastrostomy Tube 26 fr   05/09/2021 Imaging   1. Progression of lesion in the right tonsil  with central fluid collection and peripheral enhancement. This was a site of prior recurrent sick could represent progressive tumor or abscess. Continued follow-up recommended. 2. Progressive malignant lymphadenopathy in the left neck. Stable lymphadenopathy right supraclavicular region. 3. New area of stranding in the superior segment left lower lobe but certain etiology. Attention on follow-up recommended.     06/13/2021 - 11/13/2021 Chemotherapy   Patient is on Treatment Plan :  Paclitaxel + Carboplatin q7d     06/13/2021 - 11/13/2021 Chemotherapy   Patient is on Treatment Plan : HEAD/NECK Pembrolizumab Q21D     07/18/2021 Imaging   Ct chest 1. No highly specific findings identified to suggest pulmonary or nodal metastasis within the chest 2. Multifocal patchy areas of ground-glass and nodular airspace densities. Surrounding postinflammatory changes are noted with thickening of the peribronchovascular interstitium. Imaging findings are concerning for sequelae of aspiration and/or multifocal infection. Follow-up imaging after appropriate antibiotic therapy is recommended with repeat CT of the chest in 3 months to ensure resolution and to rule out the possibility of underlying metastatic nodule. 3. Coronary artery calcifications. 4. Aortic Atherosclerosis (ICD10-I70.0) and Emphysema (ICD10-J43.9).   07/18/2021 Imaging   CT neck  1. Positive treatment response with decreased size of nodal metastases in the left jugular chain. There is likely residual active tumor in the treated nodes. 2. Ulcerated lesions in the bilateral palatine fossa, progressed on the left and suspicious for synchronous tumors.   07/23/2021 Cancer Staging   Staging form: Larynx - Supraglottis, AJCC 8th Edition - Clinical stage from 07/23/2021: Stage IVA (rcT2, cN2b, cM0) - Signed by [name], MD on 07/23/2021 Stage prefix: Recurrence   09/19/2021 Imaging   IMPRESSION: 1. Positive treatment response at the cervical lymph  nodes. No residual nodal disease identified currently. 2. Persisting bilateral palatine tonsil lesion with mild size increase on the right.     09/19/2021 Imaging   IMPRESSION: No new significant lymphadenopathy seen in the mediastinum.   There is interval increase in infiltrates at multiple sites in both lungs as described in the body of the report. There is interval clearing of small patchy infiltrates in right lower lobe. Findings suggest possible waxing and waning multifocal pneumonia.   There are few subcentimeter nodules as described in the body of the report which may be part of pneumonia or neoplastic process. Short-term follow-up CT in 3 months may be considered. Coronary artery calcifications are seen. There is ectasia of ascending thoracic aorta.   01/07/2022 -  Chemotherapy   Patient is on Treatment Plan : HEAD/NECK Cetuximab q7d      PRIOR THERAPY:  - 2020: Definitive radiation therapy to laryngeal tumor (completed). - Jan 2022- Feb 2022: Concurrent chemoradiation with weekly cisplatin (completed). - 03/05/21 - 05/16/21: Docetaxel '100mg'$ , '75mg'$  q21d x2 cycles (progression, perforated duodenal ulcer). - 06/13/21 - 12/03/21: Weekly carboplatin + paclitaxel x12 weeks + pembrolizumab q21d x6 cycle (progression of disease).  CURRENT THERAPY: -Cetuximab weekly started on 01/07/2022  INTERVAL HISTORY:  [name] 71 y.o. male returns before C3D1 of cetuximab. He  is unaccompanied for this visit.  Ms. Verdene Lennert was on the phone today.  Mr. Ludvigsen has been doing pretty well.  He states the first 2 infusions were hard on him but since then he has felt much better.  He has acneform rash.  He has a couple episodes of nausea today and needs nausea medication.  He has been using antidiarrheals once a day.  He denies any worsening of pain in his neck.  He continues to use oxycodone every 6-8 hours.  No change in breathing.  Rest of the pertinent 10 point ROS reviewed and negative  Rest of  the pertinent 10 point ROS reviewed and negative. Patient Active Problem List   Diagnosis Date Noted   Goals of care, counseling/discussion 10/11/2021   Pneumonitis 09/23/2021   Hypomagnesemia 08/05/2021   Dysphagia 07/24/2021   Neuropathy due to chemotherapeutic drug ([place]) 07/04/2021   Weight loss, unintentional 06/20/2021   Protein-calorie malnutrition, severe 04/17/2021   Perforated gastric ulcer ([place])    Gastrostomy tube dysfunction ([place]) 04/12/2021   Anemia due to antineoplastic chemotherapy 03/26/2021   Cancer related pain 03/26/2021   Laryngeal edema 03/11/2021   Nausea and vomiting 01/24/2021   Shortness of breath 01/10/2021   Mucositis due to antineoplastic therapy 01/10/2021   G tube feedings ([place]) 01/10/2021   Chemotherapy induced diarrhea 10/30/2020   Port-A-Cath in place 10/23/2020   Chronic hepatitis C without hepatic coma ([place]) 06/09/2019   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  MEDICAL HISTORY: Past Medical History:  Diagnosis Date   Cancer ([place])    Throat cancer 2019   ETOH abuse    Frequent urination    Glaucoma    Hepatitis C virus infection without hepatic coma    dx'ed in 11/2018   History of radiation therapy 05/12/19- 07/06/19   Larynx   Hypertension    Wears denture    upper only; lost lower denture    SURGICAL HISTORY: Past Surgical History:  Procedure Laterality Date   ANKLE SURGERY  2011   right ankle   COLONOSCOPY  02/2019   polyps - Dr Havery Moros   DIRECT LARYNGOSCOPY N/A 04/01/2019   Procedure: DIRECT LARYNGOSCOPY WITH BIOPSY;  Surgeon: Jodi Marble, MD;  Location: [facility];  Service: ENT;  Laterality: N/A;   DIRECT LARYNGOSCOPY N/A 01/06/2020   Procedure: DIRECT LARYNGOSCOPY;  Surgeon: Izora Gala, MD;  Location: [facility];  Service: ENT;  Laterality: N/A;   DIRECT LARYNGOSCOPY N/A 08/13/2020   Procedure: DIRECT LARYNGOSCOPY;  Surgeon: Izora Gala, MD;  Location: [facility];  Service: ENT;  Laterality: N/A;   ESOPHAGOSCOPY N/A 04/01/2019   Procedure: ESOPHAGOSCOPY;  Surgeon: Jodi Marble, MD;  Location: [facility];  Service: ENT;  Laterality: N/A;   EXCISION ORAL TUMOR Right 08/13/2020   Procedure: BIOPSY OF OROPHARYNGEAL MASS;  Surgeon: Izora Gala, MD;  Location: [facility];  Service: ENT;  Laterality: Right;   EYE SURGERY Right    IR CM INJ ANY COLONIC TUBE W/FLUORO  12/20/2020   IR CM INJ ANY COLONIC TUBE W/FLUORO  03/20/2021   IR GASTROSTOMY TUBE MOD SED  09/24/2020   IR IMAGING GUIDED PORT INSERTION  09/24/2020   IR RADIOLOGIST EVAL & MGMT  07/02/2021   IR [place] GASTRO/COLONIC TUBE PERCUT W/FLUORO  01/10/2022   KNEE SURGERY     LAPAROTOMY N/A 04/12/2021   Procedure: EXPLORATORY LAPAROTOMY, REPAIR OF DUODENAL ULCER WITH GRAHAM PATCH;  Surgeon: Thermon Leyland,  Nickola Major, MD;  Location: [facility];  Service: General;  Laterality: N/A;   LARYNGOSCOPY AND BRONCHOSCOPY N/A 04/01/2019   Procedure: BRONCHOSCOPY;  Surgeon: Jodi Marble, MD;  Location: [facility];  Service: ENT;  Laterality: N/A;   RADICAL NECK DISSECTION N/A 01/06/2020   Procedure: RADICAL NECK DISSECTION;  Surgeon: Izora Gala, MD;  Location: [facility];  Service: ENT;  Laterality: N/A;    SOCIAL HISTORY: Social History   Socioeconomic History   Marital status: Single    Spouse name: Not on file   Number of children: 2   Years of education: Not on file   Highest education level: Not on file  Occupational History   Not on file  Tobacco Use   Smoking status: Former    Years: 50.00    Types: Cigarettes    Quit date: 06/01/2020    Years since quitting: 1.6   Smokeless tobacco: Never  Vaping Use   Vaping Use: Never used  Substance and Sexual Activity   Alcohol use: Yes    Alcohol/week: 4.0 standard drinks    Types: 4 Cans of beer per week    Comment: 40oz beer daily   Drug use: Yes    Types: Cocaine    Comment: none in 2 yrs   Sexual activity: Yes    Partners: Female  Other  Topics Concern   Not on file  Social History Narrative   Patient is divorced with 2 children.   Patient is currently living with his sister.   Patient with a history of smoking a third of pack of cigarettes daily for 50 years.  Patient currently smoking 2 to 3 cigarettes/day.   Patient has never used smokeless tobacco.   Patient with occasional use of alcohol.   Patient last used cocaine approximately 6 months ago.  Patient denies use of marijuana.   Social Determinants of Health   Financial Resource Strain: Not on file  Food Insecurity: Not on file  Transportation Needs: Not on file  Physical Activity: Not on file  Stress: Not on file  Social Connections: Not on file  Intimate Partner Violence: Not on file    FAMILY HISTORY: Family History  Problem Relation Age of Onset   Breast cancer Sister    Colon cancer Brother 43       ????   Cancer Brother     Review of Systems  Constitutional:  Positive for fatigue. Negative for appetite change, chills, fever and unexpected weight change.  HENT:   Positive for trouble swallowing. Negative for hearing loss, lump/mass and sore throat.   Eyes:  Negative for eye problems and icterus.  Respiratory:  Positive for shortness of breath. Negative for chest tightness and cough.   Cardiovascular:  Negative for chest pain, leg swelling and palpitations.  Gastrointestinal:  Positive for diarrhea. Negative for abdominal distention, abdominal pain, constipation, nausea and vomiting.  Endocrine: Negative for hot flashes.  Genitourinary:  Negative for difficulty urinating.   Musculoskeletal:  Positive for neck pain. Negative for arthralgias.  Skin:  Negative for itching and rash.  Neurological:  Negative for dizziness, extremity weakness, headaches and numbness.  Hematological:  Negative for adenopathy. Does not bruise/bleed easily.  Psychiatric/Behavioral:  Negative for depression. The patient is not nervous/anxious.      PHYSICAL  EXAMINATION  ECOG PERFORMANCE STATUS: 1 - Symptomatic but completely ambulatory V/S reviewed.  Blood pressure slightly high today likely because of pain  Physical Exam Constitutional:      General: He is  not in acute distress.    Appearance: Normal appearance. He is ill-appearing. He is not toxic-appearing.  HENT:     Head: Normocephalic and atraumatic.  Eyes:     General: No scleral icterus. Cardiovascular:     Rate and Rhythm: Normal rate and regular rhythm.     Pulses: Normal pulses.     Heart sounds: Normal heart sounds.  Pulmonary:     Effort: Pulmonary effort is normal.     Breath sounds: Normal breath sounds. No stridor.  Musculoskeletal:        General: No swelling.     Cervical back: Neck supple.  Lymphadenopathy:     Cervical: Cervical adenopathy (Palpable cervical lymphadenopathy in the left neck is soft on palpation today) present.  Skin:    General: Skin is warm and dry.     Findings: No rash.  Neurological:     General: No focal deficit present.     Mental Status: He is alert.  Psychiatric:        Mood and Affect: Mood normal.        Behavior: Behavior normal.    LABORATORY DATA:  CBC    Component Value Date/Time   WBC 4.3 02/04/2022 1239   RBC 2.46 (L) 02/04/2022 1239   HGB 8.2 (L) 02/04/2022 1239   HGB 10.2 (L) 11/01/2021 1157   HCT 24.9 (L) 02/04/2022 1239   PLT 235 02/04/2022 1239   PLT 222 11/01/2021 1157   MCV 101.2 (H) 02/04/2022 1239   MCH 33.3 02/04/2022 1239   MCHC 32.9 02/04/2022 1239   RDW 14.5 02/04/2022 1239   LYMPHSABS 0.8 02/04/2022 1239   MONOABS 0.5 02/04/2022 1239   EOSABS 0.1 02/04/2022 1239   BASOSABS 0.0 02/04/2022 1239    CMP     Component Value Date/Time   NA 136 02/04/2022 1239   K 4.1 02/04/2022 1239   CL 101 02/04/2022 1239   CO2 32 02/04/2022 1239   GLUCOSE 110 (H) 02/04/2022 1239   BUN 22 02/04/2022 1239   CREATININE 0.61 02/04/2022 1239   CREATININE 0.54 (L) 11/01/2021 1157   CREATININE 0.61 (L)  10/26/2019 1523   CALCIUM 8.9 02/04/2022 1239   PROT 6.6 02/04/2022 1239   ALBUMIN 3.5 02/04/2022 1239   AST 14 (L) 02/04/2022 1239   AST 15 11/01/2021 1157   ALT 10 02/04/2022 1239   ALT 9 11/01/2021 1157   ALT 53 (H) 09/06/2019 1543   ALKPHOS 38 02/04/2022 1239   BILITOT 0.3 02/04/2022 1239   BILITOT 0.4 11/01/2021 1157   GFRNONAA >60 02/04/2022 1239   GFRNONAA >60 11/01/2021 1157   GFRAA >60 01/03/2020 1355   GFRAA >60 05/04/2019 1207    ASSESSMENT and THERAPY PLAN:  ABDIAS HICKAM is a 71 y.o. male who returns for a follow up for squamous cell carcinoma of the right supraglottis.  #SCC of right supraglottis:  --Most recent CT neck contrast 11/26/21 showed increased size of bilateral palatine tonsil lesions; larger left level IIA lymph node.  --Currently on cetuximab q 7 days started on 01/07/2022 --Presents today for C3D1 of Cetuximab. Labs from today were reviewed and adequate for treatment today.  --Continue with weekly labs and treatment.  --Okay to proceed with planned infusion tomorrow if labs are all within parameters.  CBC has returned and is satisfactory. He will return to clinic in 1 week to see Dyann Ruddle and follow-up with me in 2 weeks. Anticipate imaging mid June 2023.  #Cancer related pain: --Pain is  well controlled with oxycodone 5 mg q 6 hours.  He just got a new refill.  #Nutrition/Weight loss: --Currently giving 3-4 cartons of [employer] [place] via G-tube --Under the care of registered dietician --Weight has remained stable.  He has gained a couple pounds.  #Acneform rash from cetuximab.  No treatment needed.  #Chemotherapy-induced nausea and vomiting.  Will refill Compazine today.  Patient expressed understanding and satisfaction with the plan provided.   I have spent a total of 30 minutes minutes of face-to-face and non-face-to-face time, preparing to see the patient, performing a medically appropriate examination, counseling and educating the patient, ordering  medications/tests,documenting clinical information in the electronic health record, and care coordination.   [name] MD

## 2022-02-12 ENCOUNTER — Inpatient Hospital Stay: Payer: Medicare HMO

## 2022-02-12 VITALS — BP 126/89 | HR 93 | Temp 98.2°F

## 2022-02-12 DIAGNOSIS — Z5111 Encounter for antineoplastic chemotherapy: Secondary | ICD-10-CM | POA: Diagnosis not present

## 2022-02-12 DIAGNOSIS — C321 Malignant neoplasm of supraglottis: Secondary | ICD-10-CM

## 2022-02-12 MED ORDER — FAMOTIDINE IN NACL 20-0.9 MG/50ML-% IV SOLN
20.0000 mg | Freq: Once | INTRAVENOUS | Status: AC
  Administered 2022-02-12: 20 mg via INTRAVENOUS
  Filled 2022-02-12: qty 50

## 2022-02-12 MED ORDER — HEPARIN SOD (PORK) LOCK FLUSH 100 UNIT/ML IV SOLN
500.0000 [IU] | Freq: Once | INTRAVENOUS | Status: AC | PRN
  Administered 2022-02-12: 500 [IU]

## 2022-02-12 MED ORDER — SODIUM CHLORIDE 0.9 % IV SOLN: Freq: Once | INTRAVENOUS | Status: AC

## 2022-02-12 MED ORDER — DIPHENHYDRAMINE HCL 50 MG/ML IJ SOLN
50.0000 mg | Freq: Once | INTRAMUSCULAR | Status: AC
  Administered 2022-02-12: 50 mg via INTRAVENOUS
  Filled 2022-02-12: qty 1

## 2022-02-12 MED ORDER — EMPTY CONTAINERS FLEXIBLE MISC
250.0000 mg/m2 | Freq: Once | Status: AC
  Administered 2022-02-12: 400 mg via INTRAVENOUS
  Filled 2022-02-12: qty 200

## 2022-02-12 MED ORDER — SODIUM CHLORIDE 0.9% FLUSH
10.0000 mL | INTRAVENOUS | Status: DC | PRN
  Administered 2022-02-12: 10 mL

## 2022-02-13 DIAGNOSIS — Z923 Personal history of irradiation: Secondary | ICD-10-CM | POA: Diagnosis not present

## 2022-02-13 DIAGNOSIS — Z85818 Personal history of malignant neoplasm of other sites of lip, oral cavity, and pharynx: Secondary | ICD-10-CM | POA: Diagnosis not present

## 2022-02-13 DIAGNOSIS — Z9221 Personal history of antineoplastic chemotherapy: Secondary | ICD-10-CM | POA: Diagnosis not present

## 2022-02-13 DIAGNOSIS — F17200 Nicotine dependence, unspecified, uncomplicated: Secondary | ICD-10-CM | POA: Diagnosis not present

## 2022-02-13 DIAGNOSIS — J3801 Paralysis of vocal cords and larynx, unilateral: Secondary | ICD-10-CM | POA: Diagnosis not present

## 2022-02-13 DIAGNOSIS — Z930 Tracheostomy status: Secondary | ICD-10-CM | POA: Diagnosis not present

## 2022-02-17 DIAGNOSIS — C329 Malignant neoplasm of larynx, unspecified: Secondary | ICD-10-CM | POA: Diagnosis not present

## 2022-02-17 DIAGNOSIS — I10 Essential (primary) hypertension: Secondary | ICD-10-CM | POA: Diagnosis not present

## 2022-02-17 DIAGNOSIS — E119 Type 2 diabetes mellitus without complications: Secondary | ICD-10-CM | POA: Diagnosis not present

## 2022-02-18 ENCOUNTER — Other Ambulatory Visit: Payer: Self-pay | Admitting: Hematology and Oncology

## 2022-02-18 ENCOUNTER — Other Ambulatory Visit: Payer: Self-pay

## 2022-02-18 ENCOUNTER — Inpatient Hospital Stay: Payer: Medicare HMO | Attending: Hematology and Oncology

## 2022-02-18 ENCOUNTER — Inpatient Hospital Stay: Payer: Medicare HMO | Admitting: Dietician

## 2022-02-18 ENCOUNTER — Inpatient Hospital Stay: Payer: Medicare HMO

## 2022-02-18 ENCOUNTER — Other Ambulatory Visit: Payer: Self-pay | Admitting: *Deleted

## 2022-02-18 VITALS — BP 128/99 | HR 86 | Temp 98.1°F | Resp 12 | Wt 99.2 lb

## 2022-02-18 DIAGNOSIS — Z5111 Encounter for antineoplastic chemotherapy: Secondary | ICD-10-CM | POA: Insufficient documentation

## 2022-02-18 DIAGNOSIS — C321 Malignant neoplasm of supraglottis: Secondary | ICD-10-CM | POA: Diagnosis not present

## 2022-02-18 DIAGNOSIS — Z79899 Other long term (current) drug therapy: Secondary | ICD-10-CM | POA: Diagnosis not present

## 2022-02-18 DIAGNOSIS — Z95828 Presence of other vascular implants and grafts: Secondary | ICD-10-CM

## 2022-02-18 LAB — CBC WITH DIFFERENTIAL/PLATELET
Abs Immature Granulocytes: 0.04 10*3/uL (ref 0.00–0.07)
Basophils Absolute: 0 10*3/uL (ref 0.0–0.1)
Basophils Relative: 1 %
Eosinophils Absolute: 0.3 10*3/uL (ref 0.0–0.5)
Eosinophils Relative: 5 %
HCT: 27.6 % — ABNORMAL LOW (ref 39.0–52.0)
Hemoglobin: 9.1 g/dL — ABNORMAL LOW (ref 13.0–17.0)
Immature Granulocytes: 1 %
Lymphocytes Relative: 15 %
Lymphs Abs: 0.8 10*3/uL (ref 0.7–4.0)
MCH: 32.7 pg (ref 26.0–34.0)
MCHC: 33 g/dL (ref 30.0–36.0)
MCV: 99.3 fL (ref 80.0–100.0)
Monocytes Absolute: 0.5 10*3/uL (ref 0.1–1.0)
Monocytes Relative: 9 %
Neutro Abs: 3.6 10*3/uL (ref 1.7–7.7)
Neutrophils Relative %: 69 %
Platelets: 269 10*3/uL (ref 150–400)
RBC: 2.78 MIL/uL — ABNORMAL LOW (ref 4.22–5.81)
RDW: 13.1 % (ref 11.5–15.5)
WBC: 5.2 10*3/uL (ref 4.0–10.5)
nRBC: 0 % (ref 0.0–0.2)

## 2022-02-18 LAB — COMPREHENSIVE METABOLIC PANEL
ALT: 9 U/L (ref 0–44)
AST: 13 U/L — ABNORMAL LOW (ref 15–41)
Albumin: 3.8 g/dL (ref 3.5–5.0)
Alkaline Phosphatase: 49 U/L (ref 38–126)
Anion gap: 3 — ABNORMAL LOW (ref 5–15)
BUN: 21 mg/dL (ref 8–23)
CO2: 33 mmol/L — ABNORMAL HIGH (ref 22–32)
Calcium: 9.4 mg/dL (ref 8.9–10.3)
Chloride: 99 mmol/L (ref 98–111)
Creatinine, Ser: 0.53 mg/dL — ABNORMAL LOW (ref 0.61–1.24)
GFR, Estimated: >60 mL/min (ref >60)
Glucose, Bld: 112 mg/dL — ABNORMAL HIGH (ref 70–99)
Potassium: 4.3 mmol/L (ref 3.5–5.1)
Sodium: 135 mmol/L (ref 135–145)
Total Bilirubin: 0.3 mg/dL (ref 0.3–1.2)
Total Protein: 7.1 g/dL (ref 6.5–8.1)

## 2022-02-18 LAB — MAGNESIUM: Magnesium: 1.7 mg/dL (ref 1.7–2.4)

## 2022-02-18 MED ORDER — FAMOTIDINE IN NACL 20-0.9 MG/50ML-% IV SOLN
20.0000 mg | Freq: Once | INTRAVENOUS | Status: AC
  Administered 2022-02-18: 20 mg via INTRAVENOUS
  Filled 2022-02-18: qty 50

## 2022-02-18 MED ORDER — SODIUM CHLORIDE 0.9% FLUSH
10.0000 mL | INTRAVENOUS | Status: DC | PRN
  Administered 2022-02-18: 10 mL

## 2022-02-18 MED ORDER — HEPARIN SOD (PORK) LOCK FLUSH 100 UNIT/ML IV SOLN
500.0000 [IU] | Freq: Once | INTRAVENOUS | Status: AC | PRN
  Administered 2022-02-18: 500 [IU]

## 2022-02-18 MED ORDER — DIPHENHYDRAMINE HCL 50 MG/ML IJ SOLN
50.0000 mg | Freq: Once | INTRAMUSCULAR | Status: AC
  Administered 2022-02-18: 50 mg via INTRAVENOUS
  Filled 2022-02-18: qty 1

## 2022-02-18 MED ORDER — EMPTY CONTAINERS FLEXIBLE MISC
250.0000 mg/m2 | Freq: Once | Status: AC
  Administered 2022-02-18: 400 mg via INTRAVENOUS
  Filled 2022-02-18: qty 200

## 2022-02-18 MED ORDER — SODIUM CHLORIDE 0.9 % IV SOLN: Freq: Once | INTRAVENOUS | Status: AC

## 2022-02-18 MED ORDER — SODIUM CHLORIDE 0.9% FLUSH
10.0000 mL | Freq: Once | INTRAVENOUS | Status: AC
  Administered 2022-02-18: 10 mL

## 2022-02-18 NOTE — Patient Instructions (Addendum)
[facility]  Discharge Instructions: Thank you for choosing [facility] to provide your oncology and hematology care.   If you have a lab appointment with the [facility], please go directly to the [facility] and check in at the registration area.   Wear comfortable clothing and clothing appropriate for easy access to any Portacath or PICC line.   We strive to give you quality time with your provider. You may need to reschedule your appointment if you arrive late (15 or more minutes).  Arriving late affects you and other patients whose appointments are after yours.  Also, if you miss three or more appointments without notifying the office, you may be dismissed from the clinic at the provider's discretion.      For prescription refill requests, have your pharmacy contact our office and allow 72 hours for refills to be completed.    Today you received the following chemotherapy and/or immunotherapy agents :  Cetuximab   To help prevent nausea and vomiting after your treatment, we encourage you to take your nausea medication as directed.  BELOW ARE SYMPTOMS THAT SHOULD BE REPORTED IMMEDIATELY: *FEVER GREATER THAN 100.4 F (38 C) OR HIGHER *CHILLS OR SWEATING *NAUSEA AND VOMITING THAT IS NOT CONTROLLED WITH YOUR NAUSEA MEDICATION *UNUSUAL SHORTNESS OF BREATH *UNUSUAL BRUISING OR BLEEDING *URINARY PROBLEMS (pain or burning when urinating, or frequent urination) *BOWEL PROBLEMS (unusual diarrhea, constipation, pain near the anus) TENDERNESS IN MOUTH AND THROAT WITH OR WITHOUT PRESENCE OF ULCERS (sore throat, sores in mouth, or a toothache) UNUSUAL RASH, SWELLING OR PAIN  UNUSUAL VAGINAL DISCHARGE OR ITCHING   Items with * indicate a potential emergency and should be followed up as soon as possible or go to the Emergency Department if any problems should occur.  Please show the CHEMOTHERAPY ALERT CARD or IMMUNOTHERAPY ALERT CARD at check-in to  the Emergency Department and triage nurse.  Should you have questions after your visit or need to cancel or reschedule your appointment, please contact [facility]  Dept: [phone]  and follow the prompts.  Office hours are 8:00 a.m. to 4:30 p.m. Monday - Friday. Please note that voicemails left after 4:00 p.m. may not be returned until the following business day.  We are closed weekends and major holidays. You have access to a nurse at all times for urgent questions. Please call the main number to the clinic Dept: [phone] and follow the prompts.   For any non-urgent questions, you may also contact your provider using MyChart. We now offer e-Visits for anyone 92 and older to request care online for non-urgent symptoms. For details visit [email or web address].   Also download the MyChart app! Go to the app store, search "MyChart", open the app, select [facility], and log in with your MyChart username and password.  Due to Covid, a mask is required upon entering the hospital/clinic. If you do not have a mask, one will be given to you upon arrival. For doctor visits, patients may have 1 support person aged 62 or older with them. For treatment visits, patients cannot have anyone with them due to current Covid guidelines and our immunocompromised population.

## 2022-02-18 NOTE — Progress Notes (Signed)
Nutrition Follow-up:  Patient with progression. He is currently receiving cetuximab q7d for laryngeal cancer (started 4/25).  Met with patient in infusion. Patient completing bolus feeding with chocolate Ensure Complete at visit. He reports recently being sick, endorses intermittent episodes of nausea, vomiting, diarrhea in the last couple of weeks. Patient has been giving 2 cartons of [employer] 1.4 via tube. He reports unable to do 3 cartons as this is too much for him and it makes him sick. Patient reports sometimes mixing [employer] with Osmolite (prior formula that was d/c due to poor toleration).    Medications: compazine (5/30)  Labs: glucose 112, Cr 0.53  Anthropometrics: Weight 99 lb 4 oz today decreased 5 lbs (4.8%) in 7 days; significant   5/30 - 104 lb  5/13 - 101 lb 14.4 oz  5/02 - 102 lb 12.8 oz    NUTRITION DIAGNOSIS: Severe malnutrition continues    INTERVENTION:  Instructed patient to allow 4 hours between bolus feedings Patient to use [employer] 1.4 - 3 cartons/day Discussed previous poor toleration to Osmolite, recommend pt discontinue mixing formula Take nausea medication as prescribed     MONITORING, EVALUATION, GOAL: weight trends, tube feedings    NEXT VISIT: To be scheduled with treatment plan

## 2022-02-25 ENCOUNTER — Inpatient Hospital Stay: Payer: Medicare HMO

## 2022-02-25 ENCOUNTER — Inpatient Hospital Stay (HOSPITAL_BASED_OUTPATIENT_CLINIC_OR_DEPARTMENT_OTHER): Payer: Medicare HMO | Admitting: Physician Assistant

## 2022-02-25 ENCOUNTER — Other Ambulatory Visit: Payer: Self-pay

## 2022-02-25 VITALS — BP 140/94 | HR 95 | Temp 97.4°F | Resp 19 | Ht 67.0 in | Wt 101.2 lb

## 2022-02-25 VITALS — BP 132/89 | HR 98 | Temp 98.9°F | Resp 18

## 2022-02-25 DIAGNOSIS — Z5111 Encounter for antineoplastic chemotherapy: Secondary | ICD-10-CM | POA: Diagnosis not present

## 2022-02-25 DIAGNOSIS — C321 Malignant neoplasm of supraglottis: Secondary | ICD-10-CM

## 2022-02-25 DIAGNOSIS — Z95828 Presence of other vascular implants and grafts: Secondary | ICD-10-CM

## 2022-02-25 DIAGNOSIS — Z79899 Other long term (current) drug therapy: Secondary | ICD-10-CM | POA: Diagnosis not present

## 2022-02-25 LAB — CBC WITH DIFFERENTIAL/PLATELET
Abs Immature Granulocytes: 0.04 10*3/uL (ref 0.00–0.07)
Basophils Absolute: 0.1 10*3/uL (ref 0.0–0.1)
Basophils Relative: 1 %
Eosinophils Absolute: 0.6 10*3/uL — ABNORMAL HIGH (ref 0.0–0.5)
Eosinophils Relative: 10 %
HCT: 28.2 % — ABNORMAL LOW (ref 39.0–52.0)
Hemoglobin: 9.4 g/dL — ABNORMAL LOW (ref 13.0–17.0)
Immature Granulocytes: 1 %
Lymphocytes Relative: 16 %
Lymphs Abs: 0.9 10*3/uL (ref 0.7–4.0)
MCH: 32.5 pg (ref 26.0–34.0)
MCHC: 33.3 g/dL (ref 30.0–36.0)
MCV: 97.6 fL (ref 80.0–100.0)
Monocytes Absolute: 0.7 10*3/uL (ref 0.1–1.0)
Monocytes Relative: 13 %
Neutro Abs: 3.1 10*3/uL (ref 1.7–7.7)
Neutrophils Relative %: 59 %
Platelets: 304 10*3/uL (ref 150–400)
RBC: 2.89 MIL/uL — ABNORMAL LOW (ref 4.22–5.81)
RDW: 12.9 % (ref 11.5–15.5)
WBC: 5.3 10*3/uL (ref 4.0–10.5)
nRBC: 0 % (ref 0.0–0.2)

## 2022-02-25 LAB — COMPREHENSIVE METABOLIC PANEL
ALT: 10 U/L (ref 0–44)
AST: 20 U/L (ref 15–41)
Albumin: 3.8 g/dL (ref 3.5–5.0)
Alkaline Phosphatase: 42 U/L (ref 38–126)
Anion gap: 6 (ref 5–15)
BUN: 19 mg/dL (ref 8–23)
CO2: 33 mmol/L — ABNORMAL HIGH (ref 22–32)
Calcium: 9.3 mg/dL (ref 8.9–10.3)
Chloride: 96 mmol/L — ABNORMAL LOW (ref 98–111)
Creatinine, Ser: 0.57 mg/dL — ABNORMAL LOW (ref 0.61–1.24)
GFR, Estimated: >60 mL/min (ref >60)
Glucose, Bld: 112 mg/dL — ABNORMAL HIGH (ref 70–99)
Potassium: 3.6 mmol/L (ref 3.5–5.1)
Sodium: 135 mmol/L (ref 135–145)
Total Bilirubin: 0.2 mg/dL — ABNORMAL LOW (ref 0.3–1.2)
Total Protein: 7.2 g/dL (ref 6.5–8.1)

## 2022-02-25 LAB — MAGNESIUM: Magnesium: 1.6 mg/dL — ABNORMAL LOW (ref 1.7–2.4)

## 2022-02-25 MED ORDER — SODIUM CHLORIDE 0.9% FLUSH
10.0000 mL | Freq: Once | INTRAVENOUS | Status: AC
  Administered 2022-02-25: 10 mL

## 2022-02-25 MED ORDER — MAGNESIUM SULFATE 2 GM/50ML IV SOLN
2.0000 g | Freq: Once | INTRAVENOUS | Status: AC
  Administered 2022-02-25: 2 g via INTRAVENOUS
  Filled 2022-02-25: qty 50

## 2022-02-25 MED ORDER — SODIUM CHLORIDE 0.9% FLUSH
10.0000 mL | INTRAVENOUS | Status: DC | PRN
  Administered 2022-02-25: 10 mL

## 2022-02-25 MED ORDER — DIPHENHYDRAMINE HCL 50 MG/ML IJ SOLN
50.0000 mg | Freq: Once | INTRAMUSCULAR | Status: AC
  Administered 2022-02-25: 50 mg via INTRAVENOUS
  Filled 2022-02-25: qty 1

## 2022-02-25 MED ORDER — SODIUM CHLORIDE 0.9 % IV SOLN: Freq: Once | INTRAVENOUS | Status: AC

## 2022-02-25 MED ORDER — EMPTY CONTAINERS FLEXIBLE MISC
250.0000 mg/m2 | Freq: Once | Status: AC
  Administered 2022-02-25: 400 mg via INTRAVENOUS
  Filled 2022-02-25: qty 200

## 2022-02-25 MED ORDER — HEPARIN SOD (PORK) LOCK FLUSH 100 UNIT/ML IV SOLN
500.0000 [IU] | Freq: Once | INTRAVENOUS | Status: AC | PRN
  Administered 2022-02-25: 500 [IU]

## 2022-02-25 MED ORDER — FAMOTIDINE IN NACL 20-0.9 MG/50ML-% IV SOLN
20.0000 mg | Freq: Once | INTRAVENOUS | Status: AC
  Administered 2022-02-25: 20 mg via INTRAVENOUS
  Filled 2022-02-25: qty 50

## 2022-02-25 NOTE — Progress Notes (Signed)
Pt observed for one hour post infusion, VSS upon discharge and no signs of distress noted.

## 2022-02-27 ENCOUNTER — Encounter: Payer: Self-pay | Admitting: Hematology and Oncology

## 2022-02-27 NOTE — Progress Notes (Signed)
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 SEE NOTE   04/01/2019 Surgery   Pre-Op Dx:   right supraglottic carcinoma   Post-op Dx: T2 N0 M0 (stage II) right supraglottic carcinoma   Proc: Direct laryngoscopy with biopsy.  Cervical esophagoscopy.  Bronchoscopy.   Surg:  [name]    Findings: A bulky necrotic and semi-pedunculated tumor coming off the lateral surface of the right aryepiglottic fold and the anterior and lateral aspect of the piriform sinus on the right side.  Airway was compromised by the tumor mass at intubation.   04/11/2019 Imaging   CT neck: IMPRESSION: The glottis is closed with suboptimal evaluation of the vocal cords. No asymmetry of the cords. Correlate with laryngoscopy results.   No enlarged lymph nodes in the neck. 7 mm right level 2 lymph node may be reactive. No definite pathologic lymph nodes in the neck.   04/11/2019 Imaging   CT neck  This CT was performed after surgical debulking of an exophytic laryngeal tumor.   Most apparent on series 2, image 51 there is abnormal asymmetric  hyperenhancement along the surface of the right piriform sinus, and perhaps also involving the bilateral AE folds.   04/22/2019 Imaging   PET: IMPRESSION: 1. Mild residual activity in the posterior RIGHT hypopharynx confined to the mucosa. 2. No evidence of hypermetabolic metastatic lymph nodes in LEFT or RIGHT neck. 3. No evidence distant metastatic disease.   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 New FDG avid right level 2 cervical lymph node concerning for recurrent disease. No findings of distant metastatic disease within the chest, abdomen or pelvis. 2. Mild nonspecific increased uptake in the right side of larynx is similar to previous exam. 3. Aortic Atherosclerosis (ICD10-I70.0). Coronary artery calcifications.    11/21/2019 Pathology Results   SURGICAL PATHOLOGY  CASE: [id number]  [name]  Surgical Pathology Report   FINAL MICROSCOPIC DIAGNOSIS:   A. LYMPH NODE, RIGHT CERVICAL:  - Poorly differentiated carcinoma consistent with metastatic squamous cell carcinoma.  - See comment   01/06/2020 Pathology Results   SURGICAL PATHOLOGY  CASE: [id number]  PATIENT: [name]  Surgical Pathology Report  Clinical History: metastatic cancer to cervical lymph nodes (cm)   FINAL MICROSCOPIC DIAGNOSIS:   A. LYMPH NODE, RIGHT MODIFIED, DISSECTION:  - Metastatic squamous cell carcinoma in four of nineteen lymph nodes (4/19).  - Largest metastatic nodule is 3.1 cm with 5 mm  extra-nodal extension.  - Lymphovascular space involvement in peri-nodal connective tissue.  - See comment.    01/06/2020 Surgery   PRE-OPERATIVE DIAGNOSIS:  METASTATIC CANCER TO CERVICAL LYMPH NODES   POST-OPERATIVE DIAGNOSIS:  METASTATIC CANCER TO CERVICAL LYMPH NODES   PROCEDURE:  Procedure(s): DIRECT LARYNGOSCOPY MODIFIED RADICAL NECK DISSECTION   SURGEON:  [name], MD     SPECIMEN: Right modified neck dissection including levels 2 and 3, including the internal jugular vein, suture marks the inferior jugular vein stump.   07/05/2020 Imaging   1. Sequelae of radiation and right neck dissection from the previous contrasted Neck CT.   2. Asymmetric soft tissue thickening and enhancement along the right lateral pharynx. Although this might be asymmetric mucositis, the appearance is suspicious and recommend direct inspection. NI-RADS category 2a.   3. Suspicious small 6 mm hyperenhancing nodular soft tissue along the posterior margin of the neck dissection at right level 3. NI-RADS category 2 vs 3 - although this is likely too small for imaging guided biopsy. Repeat PET-CT may be most valuable.   4. Mild inflammatory appearing right upper lobe centrilobular ground-glass opacity, new since February. Consider mild or developing right upper lobe infection. Post radiation changes to the lung apices.       08/13/2020 Pathology Results   A. OROPHARNYX, BIOPSY:  - Poorly differentiated squamous cell carcinoma.  - See comment.    08/13/2020 Surgery   POST-OPERATIVE DIAGNOSIS:  Tonsillary Mass History of Laryngeal  Cancer   PROCEDURE:  Procedure(s): DIRECT LARYNGOSCOPY BIOPSY OF OROPHARYNGEAL MASS   08/30/2020 PET scan   1. Recurrence of head neck carcinoma with a broad hypermetabolic pharyngeal mucosal lesion involving the RIGHT lateral posterior oropharynx and hypopharynx extending from the palatine tonsil to the vallecula. 2. Hypermetabolic lymph node posterior  sternocleidomastoid muscle on the RIGHT (level 3). 3. Hypermetabolic nodule within the LEFT parotid glands favored small primary parotid neoplasm. 4. No evidence thoracic metastasis   09/24/2020 - 10/30/2020 Chemotherapy   He received weekly cisplatin   01/31/2021 PET scan   Findings of worsening nodal disease with a node or group of nodes in the LEFT neck and a RIGHT supraclavicular lymph node as discussed.   Post treatment changes with soft tissue fullness in the area of the prior tumor but with markedly diminished metabolic activity.     03/11/2021 Imaging   Ct neck  Diffuse pharyngeal and laryngeal mucosal and submucosal edema probably related to acute radiation injury. The airway is narrow and could possibly be critically narrowed. I do not see any evidence of recurrent mucosal or submucosal tumor identifiable.   Similar appearance of the recurrent malignant lymphadenopathy on the left at level 2 and level 3 and on the right in the low supraclavicular region just above and behind the clavicular head.   04/12/2021 Imaging   Ct abdomen and pelvis  1. Contrast material in the peritoneal cavity of the upper abdomen suggesting leak along the gastrostomy tract. 2. Gastrostomy tube is appropriately positioned. No evidence of abscess. 3. 1.3 cm right middle lobe nodule, new since 01/31/2021. Favor infectious/inflammatory over primary/secondary neoplasm given the relatively short time frame of development.   04/12/2021 Surgery   Postoperative Diagnosis: Perforated duodenal ulcer (1st  portion)   Surgical Procedure:  Modified Graham patch of duodenal ulcer Gastrostomy tube exchange   Operative Team Members:  Surgeon(s) and Role:    * Stechschulte, Nickola Major, MD - Primary     Drains:  (19 Fr) Jackson-Pratt drain(s) with closed bulb suction in the right upper quadrant near the duodenal ulcer repair and Gastrostomy Tube 26 fr   05/09/2021 Imaging   1. Progression of lesion in the right tonsil  with central fluid collection and peripheral enhancement. This was a site of prior recurrent sick could represent progressive tumor or abscess. Continued follow-up recommended. 2. Progressive malignant lymphadenopathy in the left neck. Stable lymphadenopathy right supraclavicular region. 3. New area of stranding in the superior segment left lower lobe but certain etiology. Attention on follow-up recommended.     06/13/2021 - 11/13/2021 Chemotherapy   Patient is on Treatment Plan :  Paclitaxel + Carboplatin q7d     06/13/2021 - 11/13/2021 Chemotherapy   Patient is on Treatment Plan : HEAD/NECK Pembrolizumab Q21D     07/18/2021 Imaging   Ct chest 1. No highly specific findings identified to suggest pulmonary or nodal metastasis within the chest 2. Multifocal patchy areas of ground-glass and nodular airspace densities. Surrounding postinflammatory changes are noted with thickening of the peribronchovascular interstitium. Imaging findings are concerning for sequelae of aspiration and/or multifocal infection. Follow-up imaging after appropriate antibiotic therapy is recommended with repeat CT of the chest in 3 months to ensure resolution and to rule out the possibility of underlying metastatic nodule. 3. Coronary artery calcifications. 4. Aortic Atherosclerosis (ICD10-I70.0) and Emphysema (ICD10-J43.9).   07/18/2021 Imaging   CT neck  1. Positive treatment response with decreased size of nodal metastases in the left jugular chain. There is likely residual active tumor in the treated nodes. 2. Ulcerated lesions in the bilateral palatine fossa, progressed on the left and suspicious for synchronous tumors.   07/23/2021 Cancer Staging   Staging form: Larynx - Supraglottis, AJCC 8th Edition - Clinical stage from 07/23/2021: Stage IVA (rcT2, cN2b, cM0) - Signed by [name], MD on 07/23/2021 Stage prefix: Recurrence   09/19/2021 Imaging   IMPRESSION: 1. Positive treatment response at the cervical lymph  nodes. No residual nodal disease identified currently. 2. Persisting bilateral palatine tonsil lesion with mild size increase on the right.     09/19/2021 Imaging   IMPRESSION: No new significant lymphadenopathy seen in the mediastinum.   There is interval increase in infiltrates at multiple sites in both lungs as described in the body of the report. There is interval clearing of small patchy infiltrates in right lower lobe. Findings suggest possible waxing and waning multifocal pneumonia.   There are few subcentimeter nodules as described in the body of the report which may be part of pneumonia or neoplastic process. Short-term follow-up CT in 3 months may be considered. Coronary artery calcifications are seen. There is ectasia of ascending thoracic aorta.   01/07/2022 -  Chemotherapy   Patient is on Treatment Plan : HEAD/NECK Cetuximab q7d      PRIOR THERAPY:  - 2020: Definitive radiation therapy to laryngeal tumor (completed). - Jan 2022- Feb 2022: Concurrent chemoradiation with weekly cisplatin (completed). - 03/05/21 - 05/16/21: Docetaxel '100mg'$ , '75mg'$  q21d x2 cycles (progression, perforated duodenal ulcer). - 06/13/21 - 12/03/21: Weekly carboplatin + paclitaxel x12 weeks + pembrolizumab q21d x6 cycle (progression of disease).  CURRENT THERAPY: -Cetuximab weekly started on 01/07/2022  INTERVAL HISTORY:  [name] 71 y.o. male returns before C7D1 of cetuximab. He  is unaccompanied for this visit.    [name] reports that overall, he is tolerating chemotherapy. His energy levels are pretty stable. He experiences fatigue following chemotherapy. He continues to use feeding tube for nutrition. He takes 3 cartons per day of tube feedings. He denies any nausea or vomiting. The neck pain is well controlled with oxycodone which he takes every 6-8 hours. He reports mild bleeding around his trach.  He denies fevers, chills, sweats, shortness of breath, chest pain or cough. He has no other  complaints.  Rest of the pertinent 10 point ROS reviewed and negative  Patient Active Problem List   Diagnosis Date Noted   Goals of care, counseling/discussion 10/11/2021   Pneumonitis 09/23/2021   Hypomagnesemia 08/05/2021   Dysphagia 07/24/2021   Neuropathy due to chemotherapeutic drug ([place]) 07/04/2021   Weight loss, unintentional 06/20/2021   Protein-calorie malnutrition, severe 04/17/2021   Perforated gastric ulcer ([place])    Gastrostomy tube dysfunction ([place]) 04/12/2021   Anemia due to antineoplastic chemotherapy 03/26/2021   Cancer related pain 03/26/2021   Laryngeal edema 03/11/2021   Nausea and vomiting 01/24/2021   Shortness of breath 01/10/2021   Mucositis due to antineoplastic therapy 01/10/2021   G tube feedings ([place]) 01/10/2021   Chemotherapy induced diarrhea 10/30/2020   Port-A-Cath in place 10/23/2020   Chronic hepatitis C without hepatic coma ([place]) 06/09/2019   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  MEDICAL HISTORY: Past Medical History:  Diagnosis Date   Cancer ([place])    Throat cancer 2019   ETOH abuse    Frequent urination    Glaucoma    Hepatitis C virus infection without hepatic coma    dx'ed in 11/2018   History of radiation therapy 05/12/19- 07/06/19   Larynx   Hypertension    Wears denture    upper only; lost lower denture    SURGICAL HISTORY: Past Surgical History:  Procedure Laterality Date   ANKLE SURGERY  2011   right ankle   COLONOSCOPY  02/2019   polyps - Dr Havery Moros   DIRECT LARYNGOSCOPY N/A 04/01/2019   Procedure: DIRECT LARYNGOSCOPY WITH BIOPSY;  Surgeon: Jodi Marble, MD;  Location: [facility];  Service: ENT;  Laterality: N/A;   DIRECT LARYNGOSCOPY N/A 01/06/2020   Procedure: DIRECT LARYNGOSCOPY;  Surgeon: Izora Gala, MD;  Location: [facility];  Service: ENT;  Laterality: N/A;   DIRECT LARYNGOSCOPY N/A 08/13/2020   Procedure: DIRECT LARYNGOSCOPY;  Surgeon: Izora Gala, MD;   Location: [facility];  Service: ENT;  Laterality: N/A;   ESOPHAGOSCOPY N/A 04/01/2019   Procedure: ESOPHAGOSCOPY;  Surgeon: Jodi Marble, MD;  Location: [facility];  Service: ENT;  Laterality: N/A;   EXCISION ORAL TUMOR Right 08/13/2020   Procedure: BIOPSY OF OROPHARYNGEAL MASS;  Surgeon: Izora Gala, MD;  Location: [facility];  Service: ENT;  Laterality: Right;   EYE SURGERY Right    IR CM INJ ANY COLONIC TUBE W/FLUORO  12/20/2020   IR CM INJ ANY COLONIC TUBE W/FLUORO  03/20/2021   IR GASTROSTOMY TUBE MOD SED  09/24/2020   IR IMAGING GUIDED PORT INSERTION  09/24/2020   IR RADIOLOGIST EVAL & MGMT  07/02/2021   IR [facility] GASTRO/COLONIC TUBE PERCUT W/FLUORO  01/10/2022   KNEE SURGERY     LAPAROTOMY N/A 04/12/2021   Procedure: EXPLORATORY LAPAROTOMY, REPAIR OF DUODENAL ULCER WITH Silvestre Gunner;  Surgeon: Felicie Morn, MD;  Location: [facility];  Service: General;  Laterality: N/A;   LARYNGOSCOPY AND BRONCHOSCOPY N/A 04/01/2019   Procedure: BRONCHOSCOPY;  Surgeon: Jodi Marble, MD;  Location: [facility];  Service: ENT;  Laterality: N/A;   RADICAL NECK DISSECTION N/A 01/06/2020   Procedure: RADICAL NECK DISSECTION;  Surgeon: Izora Gala, MD;  Location: [facility];  Service: ENT;  Laterality: N/A;    SOCIAL HISTORY: Social History   Socioeconomic History   Marital status: Single    Spouse name: Not on file   Number of children: 2   Years of education: Not on file   Highest education level: Not on file  Occupational History   Not on file  Tobacco Use   Smoking status: Former    Years: 50.00    Types: Cigarettes    Quit date: 06/01/2020    Years since quitting: 1.7   Smokeless tobacco: Never  Vaping Use   Vaping Use: Never used  Substance and Sexual Activity   Alcohol use: Yes    Alcohol/week: 4.0 standard drinks of alcohol    Types: 4 Cans of beer per week    Comment: 40oz beer daily   Drug use: Yes    Types: Cocaine    Comment: none in 2 yrs   Sexual activity:  Yes    Partners: Female  Other Topics Concern   Not on file  Social History Narrative   Patient is divorced with 2 children.   Patient is currently living with his sister.   Patient with a history of smoking a third of pack of cigarettes daily for 50 years.  Patient currently smoking 2 to 3 cigarettes/day.   Patient has never used smokeless tobacco.   Patient with occasional use of alcohol.   Patient last used cocaine approximately 6 months ago.  Patient denies use of marijuana.   Social Determinants of Health   Financial Resource Strain: Not on file  Food Insecurity: No Food Insecurity (09/21/2020)   Hunger Vital Sign    Worried About Running Out of Food in the Last Year: Never true    Ran Out of Food in the Last Year: Never true  Transportation Needs: No Transportation Needs (09/21/2020)   PRAPARE - Hydrologist (Medical): No    Lack of Transportation (Non-Medical): No  Physical Activity: Not on file  Stress: Not on file  Social Connections: Unknown (09/21/2020)   Social Connection and Isolation Panel [NHANES]    Frequency of Communication with Friends and Family: Three times a week    Frequency of Social Gatherings with Friends and Family: More than three times a week    Attends Religious Services: Not on file    Active Member of Clubs or Organizations: Not on file    Attends Archivist Meetings: Not on file    Marital Status: Not on file  Intimate Partner Violence: Not At Risk (04/27/2019)   Humiliation, Afraid, Rape, and Kick questionnaire    Fear of Current or Ex-Partner: No    Emotionally Abused: No    Physically Abused: No    Sexually Abused: No    FAMILY HISTORY: Family History  Problem Relation Age of Onset   Breast cancer Sister    Colon cancer Brother 38       ????   Cancer Brother     Review of Systems  Constitutional:  Positive for fatigue. Negative for appetite change, chills, fever and unexpected weight change.  HENT:    Positive for trouble swallowing. Negative for hearing loss,  lump/mass and sore throat.   Eyes:  Negative for eye problems and icterus.  Respiratory:  Negative for chest tightness, cough and shortness of breath.   Cardiovascular:  Negative for chest pain, leg swelling and palpitations.  Gastrointestinal:  Negative for abdominal distention, abdominal pain, constipation, nausea and vomiting.  Endocrine: Negative for hot flashes.  Genitourinary:  Negative for difficulty urinating.   Musculoskeletal:  Positive for neck pain. Negative for arthralgias.  Skin:  Negative for itching and rash.  Neurological:  Negative for dizziness, extremity weakness, headaches and numbness.  Hematological:  Negative for adenopathy. Does not bruise/bleed easily.  Psychiatric/Behavioral:  Negative for depression. The patient is not nervous/anxious.       PHYSICAL EXAMINATION  ECOG PERFORMANCE STATUS: 1 - Symptomatic but completely ambulatory V/S reviewed.  Blood pressure slightly high today likely because of pain  Physical Exam Constitutional:      General: He is not in acute distress.    Appearance: Normal appearance. He is ill-appearing. He is not toxic-appearing.  HENT:     Head: Normocephalic and atraumatic.  Eyes:     General: No scleral icterus. Cardiovascular:     Rate and Rhythm: Normal rate and regular rhythm.     Pulses: Normal pulses.     Heart sounds: Normal heart sounds.  Pulmonary:     Effort: Pulmonary effort is normal.     Breath sounds: Normal breath sounds. No stridor.  Musculoskeletal:        General: No swelling.     Cervical back: Neck supple.  Lymphadenopathy:     Cervical: Cervical adenopathy (Palpable cervical lymphadenopathy in the left neck is soft on palpation today) present.  Skin:    General: Skin is warm and dry.     Findings: No rash.  Neurological:     General: No focal deficit present.     Mental Status: He is alert.  Psychiatric:        Mood and Affect: Mood  normal.        Behavior: Behavior normal.     LABORATORY DATA:  CBC    Component Value Date/Time   WBC 5.3 02/25/2022 1236   RBC 2.89 (L) 02/25/2022 1236   HGB 9.4 (L) 02/25/2022 1236   HGB 10.2 (L) 11/01/2021 1157   HCT 28.2 (L) 02/25/2022 1236   PLT 304 02/25/2022 1236   PLT 222 11/01/2021 1157   MCV 97.6 02/25/2022 1236   MCH 32.5 02/25/2022 1236   MCHC 33.3 02/25/2022 1236   RDW 12.9 02/25/2022 1236   LYMPHSABS 0.9 02/25/2022 1236   MONOABS 0.7 02/25/2022 1236   EOSABS 0.6 (H) 02/25/2022 1236   BASOSABS 0.1 02/25/2022 1236    CMP     Component Value Date/Time   NA 135 02/25/2022 1236   K 3.6 02/25/2022 1236   CL 96 (L) 02/25/2022 1236   CO2 33 (H) 02/25/2022 1236   GLUCOSE 112 (H) 02/25/2022 1236   BUN 19 02/25/2022 1236   CREATININE 0.57 (L) 02/25/2022 1236   CREATININE 0.54 (L) 11/01/2021 1157   CREATININE 0.61 (L) 10/26/2019 1523   CALCIUM 9.3 02/25/2022 1236   PROT 7.2 02/25/2022 1236   ALBUMIN 3.8 02/25/2022 1236   AST 20 02/25/2022 1236   AST 15 11/01/2021 1157   ALT 10 02/25/2022 1236   ALT 9 11/01/2021 1157   ALT 53 (H) 09/06/2019 1543   ALKPHOS 42 02/25/2022 1236   BILITOT 0.2 (L) 02/25/2022 1236   BILITOT 0.4 11/01/2021 1157  GFRNONAA >60 02/25/2022 1236   GFRNONAA >60 11/01/2021 1157   GFRAA >60 01/03/2020 1355   GFRAA >60 05/04/2019 1207    ASSESSMENT and THERAPY PLAN:  [name] is a 71 y.o. male who returns for a follow up for squamous cell carcinoma of the right supraglottis.  #SCC of right supraglottis:  --Most recent CT neck contrast 11/26/21 showed increased size of bilateral palatine tonsil lesions; larger left level IIA lymph node.  --Currently on cetuximab q 7 days started on 01/07/2022 --Presents today for C7D1 of Cetuximab. Labs from today were reviewed and adequate for treatment today.  --Continue with weekly labs and treatment today.  --RTC in 1 week with labs and follow up with Dr. Chryl Heck before Cycle 8 of cetuximab. He  will have restaging CT Scans prior to visit.   #Hypomagnesemia:  --Magnesium level is 1.6 today --Administered 2 gm of IV mag with treatment today --Continue on mag ox PO daily  #Cancer related pain: --Pain is well controlled with oxycodone 5 mg q 6 hours.   #Nutrition/Weight loss: --Currently giving 3 cartons of [employer] [place] via G-tube --Under the care of registered dietician --Weight has remained stable.  He has gained a couple pounds.  #Acneform rash from cetuximab -Stable, no treatment needed.  #Chemotherapy-induced nausea and vomiting.   --Controlled with zofran and compazine  Patient expressed understanding and satisfaction with the plan provided.   I have spent a total of 30 minutes minutes of face-to-face and non-face-to-face time, preparing to see the patient, performing a medically appropriate examination, counseling and educating the patient, ordering medications/tests,documenting clinical information in the electronic health record, and care coordination.   Dede Query PA-C Dept of Hematology and [place] at [facility] Phone: [phone]

## 2022-03-03 ENCOUNTER — Ambulatory Visit (HOSPITAL_COMMUNITY)
Admission: RE | Admit: 2022-03-03 | Discharge: 2022-03-03 | Disposition: A | Discharge status: Home / Self Care | Payer: Medicare HMO | Source: Ambulatory Visit | Attending: Hematology and Oncology | Admitting: Hematology and Oncology

## 2022-03-03 DIAGNOSIS — J392 Other diseases of pharynx: Secondary | ICD-10-CM | POA: Diagnosis not present

## 2022-03-03 DIAGNOSIS — C321 Malignant neoplasm of supraglottis: Secondary | ICD-10-CM | POA: Diagnosis not present

## 2022-03-03 DIAGNOSIS — J984 Other disorders of lung: Secondary | ICD-10-CM | POA: Diagnosis not present

## 2022-03-03 DIAGNOSIS — R918 Other nonspecific abnormal finding of lung field: Secondary | ICD-10-CM | POA: Diagnosis not present

## 2022-03-03 DIAGNOSIS — J387 Other diseases of larynx: Secondary | ICD-10-CM | POA: Diagnosis not present

## 2022-03-03 DIAGNOSIS — C760 Malignant neoplasm of head, face and neck: Secondary | ICD-10-CM | POA: Diagnosis not present

## 2022-03-03 DIAGNOSIS — I771 Stricture of artery: Secondary | ICD-10-CM | POA: Diagnosis not present

## 2022-03-03 MED ORDER — IOHEXOL 300 MG/ML  SOLN
80.0000 mL | Freq: Once | INTRAMUSCULAR | Status: AC | PRN
  Administered 2022-03-03: 80 mL via INTRAVENOUS

## 2022-03-03 MED ORDER — SODIUM CHLORIDE (PF) 0.9 % IJ SOLN
INTRAMUSCULAR | Status: AC
  Filled 2022-03-03: qty 50

## 2022-03-04 ENCOUNTER — Inpatient Hospital Stay: Payer: Medicare HMO

## 2022-03-04 ENCOUNTER — Inpatient Hospital Stay: Payer: Medicare HMO | Admitting: Hematology and Oncology

## 2022-03-06 ENCOUNTER — Ambulatory Visit: Payer: Medicare HMO | Admitting: Hematology and Oncology

## 2022-03-06 ENCOUNTER — Telehealth: Payer: Self-pay | Admitting: *Deleted

## 2022-03-06 NOTE — Telephone Encounter (Signed)
Late entry - this RN spoke with pt's caregiver Verdene Lennert per missed appt.  She had date down for tomorrow.  Per MD pt needs to be seen as scheduled next week to review scans and further treatment decisions.  Informed Veronica of above and verified per her repeating appt date and time for next week

## 2022-03-07 ENCOUNTER — Emergency Department (HOSPITAL_COMMUNITY)
Admission: EM | Admit: 2022-03-07 | Discharge: 2022-03-07 | Disposition: A | Discharge status: Home / Self Care | Payer: Medicare HMO | Source: Home / Self Care | Attending: Emergency Medicine | Admitting: Emergency Medicine

## 2022-03-07 ENCOUNTER — Emergency Department (HOSPITAL_COMMUNITY)
Admission: EM | Admit: 2022-03-07 | Discharge: 2022-03-07 | Discharge status: Against Medical Advice | Payer: Medicare HMO | Attending: Emergency Medicine | Admitting: Emergency Medicine

## 2022-03-07 ENCOUNTER — Encounter (HOSPITAL_COMMUNITY): Payer: Self-pay

## 2022-03-07 ENCOUNTER — Encounter (HOSPITAL_COMMUNITY): Payer: Self-pay | Admitting: Emergency Medicine

## 2022-03-07 ENCOUNTER — Other Ambulatory Visit: Payer: Self-pay

## 2022-03-07 DIAGNOSIS — R531 Weakness: Secondary | ICD-10-CM | POA: Insufficient documentation

## 2022-03-07 DIAGNOSIS — Z5321 Procedure and treatment not carried out due to patient leaving prior to being seen by health care provider: Secondary | ICD-10-CM | POA: Insufficient documentation

## 2022-03-07 DIAGNOSIS — Z8521 Personal history of malignant neoplasm of larynx: Secondary | ICD-10-CM | POA: Insufficient documentation

## 2022-03-07 DIAGNOSIS — J9503 Malfunction of tracheostomy stoma: Secondary | ICD-10-CM | POA: Insufficient documentation

## 2022-03-07 DIAGNOSIS — J9509 Other tracheostomy complication: Secondary | ICD-10-CM | POA: Diagnosis not present

## 2022-03-07 DIAGNOSIS — Z430 Encounter for attention to tracheostomy: Secondary | ICD-10-CM | POA: Diagnosis not present

## 2022-03-07 MED ORDER — OXYCODONE HCL 5 MG PO TABS
5.0000 mg | ORAL_TABLET | Freq: Four times a day (QID) | ORAL | Status: DC | PRN
  Administered 2022-03-07: 5 mg via ORAL
  Filled 2022-03-07: qty 1

## 2022-03-10 ENCOUNTER — Other Ambulatory Visit: Payer: Self-pay | Admitting: Hematology and Oncology

## 2022-03-10 MED ORDER — OXYCODONE HCL 5 MG PO TABS: 5.0000 mg | ORAL_TABLET | Freq: Four times a day (QID) | ORAL | 0 refills | Status: DC | PRN

## 2022-03-11 ENCOUNTER — Inpatient Hospital Stay: Payer: Medicare HMO

## 2022-03-11 ENCOUNTER — Encounter: Payer: Self-pay | Admitting: Adult Health

## 2022-03-11 ENCOUNTER — Other Ambulatory Visit: Payer: Self-pay

## 2022-03-11 ENCOUNTER — Inpatient Hospital Stay (HOSPITAL_BASED_OUTPATIENT_CLINIC_OR_DEPARTMENT_OTHER): Payer: Medicare HMO | Admitting: Adult Health

## 2022-03-11 ENCOUNTER — Other Ambulatory Visit: Payer: Self-pay | Admitting: *Deleted

## 2022-03-11 VITALS — BP 159/93 | HR 98 | Resp 19

## 2022-03-11 VITALS — BP 193/111 | HR 84 | Temp 98.1°F | Resp 17 | Wt 100.1 lb

## 2022-03-11 DIAGNOSIS — R21 Rash and other nonspecific skin eruption: Secondary | ICD-10-CM

## 2022-03-11 DIAGNOSIS — Z95828 Presence of other vascular implants and grafts: Secondary | ICD-10-CM

## 2022-03-11 DIAGNOSIS — C321 Malignant neoplasm of supraglottis: Secondary | ICD-10-CM

## 2022-03-11 DIAGNOSIS — Z5111 Encounter for antineoplastic chemotherapy: Secondary | ICD-10-CM | POA: Diagnosis not present

## 2022-03-11 DIAGNOSIS — Z79899 Other long term (current) drug therapy: Secondary | ICD-10-CM | POA: Diagnosis not present

## 2022-03-11 LAB — CBC WITH DIFFERENTIAL/PLATELET
Abs Immature Granulocytes: 0.04 10*3/uL (ref 0.00–0.07)
Basophils Absolute: 0 10*3/uL (ref 0.0–0.1)
Basophils Relative: 1 %
Eosinophils Absolute: 0.2 10*3/uL (ref 0.0–0.5)
Eosinophils Relative: 4 %
HCT: 31.6 % — ABNORMAL LOW (ref 39.0–52.0)
Hemoglobin: 10.7 g/dL — ABNORMAL LOW (ref 13.0–17.0)
Immature Granulocytes: 1 %
Lymphocytes Relative: 14 %
Lymphs Abs: 0.8 10*3/uL (ref 0.7–4.0)
MCH: 31.9 pg (ref 26.0–34.0)
MCHC: 33.9 g/dL (ref 30.0–36.0)
MCV: 94.3 fL (ref 80.0–100.0)
Monocytes Absolute: 0.5 10*3/uL (ref 0.1–1.0)
Monocytes Relative: 9 %
Neutro Abs: 4.1 10*3/uL (ref 1.7–7.7)
Neutrophils Relative %: 71 %
Platelets: 293 10*3/uL (ref 150–400)
RBC: 3.35 MIL/uL — ABNORMAL LOW (ref 4.22–5.81)
RDW: 12.5 % (ref 11.5–15.5)
WBC: 5.6 10*3/uL (ref 4.0–10.5)
nRBC: 0 % (ref 0.0–0.2)

## 2022-03-11 LAB — COMPREHENSIVE METABOLIC PANEL
ALT: 9 U/L (ref 0–44)
AST: 13 U/L — ABNORMAL LOW (ref 15–41)
Albumin: 4.1 g/dL (ref 3.5–5.0)
Alkaline Phosphatase: 49 U/L (ref 38–126)
Anion gap: 8 (ref 5–15)
BUN: 10 mg/dL (ref 8–23)
CO2: 30 mmol/L (ref 22–32)
Calcium: 9.6 mg/dL (ref 8.9–10.3)
Chloride: 97 mmol/L — ABNORMAL LOW (ref 98–111)
Creatinine, Ser: 0.52 mg/dL — ABNORMAL LOW (ref 0.61–1.24)
GFR, Estimated: >60 mL/min (ref >60)
Glucose, Bld: 104 mg/dL — ABNORMAL HIGH (ref 70–99)
Potassium: 3.7 mmol/L (ref 3.5–5.1)
Sodium: 135 mmol/L (ref 135–145)
Total Bilirubin: 0.4 mg/dL (ref 0.3–1.2)
Total Protein: 7.7 g/dL (ref 6.5–8.1)

## 2022-03-11 LAB — MAGNESIUM: Magnesium: 1.4 mg/dL — ABNORMAL LOW (ref 1.7–2.4)

## 2022-03-11 MED ORDER — SODIUM CHLORIDE 0.9% FLUSH
10.0000 mL | Freq: Once | INTRAVENOUS | Status: AC
  Administered 2022-03-11: 10 mL

## 2022-03-11 MED ORDER — SODIUM CHLORIDE 0.9 % IV SOLN: 8.0000 mg | Freq: Once | INTRAVENOUS | Status: DC

## 2022-03-11 MED ORDER — CLINDAMYCIN PHOSPHATE 1 % EX GEL: Freq: Two times a day (BID) | CUTANEOUS | 0 refills | Status: DC

## 2022-03-11 MED ORDER — ONDANSETRON HCL 4 MG/2ML IJ SOLN
8.0000 mg | Freq: Once | INTRAMUSCULAR | Status: AC
  Administered 2022-03-11: 8 mg via INTRAVENOUS
  Filled 2022-03-11: qty 4

## 2022-03-11 MED ORDER — SODIUM CHLORIDE 0.9 % IV SOLN: Freq: Once | INTRAVENOUS | Status: AC

## 2022-03-11 MED ORDER — HEPARIN SOD (PORK) LOCK FLUSH 100 UNIT/ML IV SOLN
500.0000 [IU] | Freq: Once | INTRAVENOUS | Status: AC
  Administered 2022-03-11: 500 [IU]

## 2022-03-11 MED ORDER — MAGNESIUM SULFATE 2 GM/50ML IV SOLN
2.0000 g | Freq: Once | INTRAVENOUS | Status: AC
  Administered 2022-03-11: 2 g via INTRAVENOUS
  Filled 2022-03-11: qty 50

## 2022-03-11 NOTE — Progress Notes (Addendum)
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 SEE NOTE   04/01/2019 Surgery   Pre-Op Dx:   right supraglottic carcinoma   Post-op Dx: T2 N0 M0 (stage II) right supraglottic carcinoma   Proc: Direct laryngoscopy with biopsy.  Cervical esophagoscopy.  Bronchoscopy.   Surg:  [name]    Findings: A bulky necrotic and semi-pedunculated tumor coming off the lateral surface of the right aryepiglottic fold and the anterior and lateral aspect of the piriform sinus on the right side.  Airway was compromised by the tumor mass at intubation.   04/11/2019 Imaging   CT neck: IMPRESSION: The glottis is closed with suboptimal evaluation of the vocal cords. No asymmetry of the cords. Correlate with laryngoscopy results.   No enlarged lymph nodes in the neck. 7 mm right level 2 lymph node may be reactive. No definite pathologic lymph nodes in the neck.   04/11/2019 Imaging   CT neck  This CT was performed after surgical debulking of an exophytic laryngeal tumor.   Most apparent on series 2, image 51 there is abnormal asymmetric  hyperenhancement along the surface of the right piriform sinus, and perhaps also involving the bilateral AE folds.   04/22/2019 Imaging   PET: IMPRESSION: 1. Mild residual activity in the posterior RIGHT hypopharynx confined to the mucosa. 2. No evidence of hypermetabolic metastatic lymph nodes in LEFT or RIGHT neck. 3. No evidence distant metastatic disease.   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 New FDG avid right level 2 cervical lymph node concerning for recurrent disease. No findings of distant metastatic disease within the chest, abdomen or pelvis. 2. Mild nonspecific increased uptake in the right side of larynx is similar to previous exam. 3. Aortic Atherosclerosis (ICD10-I70.0). Coronary artery calcifications.    11/21/2019 Pathology Results   SURGICAL PATHOLOGY  CASE: [id number]  [name]  Surgical Pathology Report   FINAL MICROSCOPIC DIAGNOSIS:   A. LYMPH NODE, RIGHT CERVICAL:  - Poorly differentiated carcinoma consistent with metastatic squamous cell carcinoma.  - See comment   01/06/2020 Pathology Results   SURGICAL PATHOLOGY  CASE: [id number]  [name]  Surgical Pathology Report  Clinical History: metastatic cancer to cervical lymph nodes (cm)   FINAL MICROSCOPIC DIAGNOSIS:   A. LYMPH NODE, RIGHT MODIFIED, DISSECTION:  - Metastatic squamous cell carcinoma in four of nineteen lymph nodes (4/19).  - Largest metastatic nodule is 3.1 cm with 5 mm  extra-nodal extension.  - Lymphovascular space involvement in peri-nodal connective tissue.  - See comment.    01/06/2020 Surgery   PRE-OPERATIVE DIAGNOSIS:  METASTATIC CANCER TO CERVICAL LYMPH NODES   POST-OPERATIVE DIAGNOSIS:  METASTATIC CANCER TO CERVICAL LYMPH NODES   PROCEDURE:  Procedure(s): DIRECT LARYNGOSCOPY MODIFIED RADICAL NECK DISSECTION   SURGEON:  [name], MD     SPECIMEN: Right modified neck dissection including levels 2 and 3, including the internal jugular vein, suture marks the inferior jugular vein stump.   07/05/2020 Imaging   1. Sequelae of radiation and right neck dissection from the previous contrasted Neck CT.   2. Asymmetric soft tissue thickening and enhancement along the right lateral pharynx. Although this might be asymmetric mucositis, the appearance is suspicious and recommend direct inspection. NI-RADS category 2a.   3. Suspicious small 6 mm hyperenhancing nodular soft tissue along the posterior margin of the neck dissection at right level 3. NI-RADS category 2 vs 3 - although this is likely too small for imaging guided biopsy. Repeat PET-CT may be most valuable.   4. Mild inflammatory appearing right upper lobe centrilobular ground-glass opacity, new since February. Consider mild or developing right upper lobe infection. Post radiation changes to the lung apices.       08/13/2020 Pathology Results   A. OROPHARNYX, BIOPSY:  - Poorly differentiated squamous cell carcinoma.  - See comment.    08/13/2020 Surgery   POST-OPERATIVE DIAGNOSIS:  Tonsillary Mass History of Laryngeal  Cancer   PROCEDURE:  Procedure(s): DIRECT LARYNGOSCOPY BIOPSY OF OROPHARYNGEAL MASS   08/30/2020 PET scan   1. Recurrence of head neck carcinoma with a broad hypermetabolic pharyngeal mucosal lesion involving the RIGHT lateral posterior oropharynx and hypopharynx extending from the palatine tonsil to the vallecula. 2. Hypermetabolic lymph node posterior  sternocleidomastoid muscle on the RIGHT (level 3). 3. Hypermetabolic nodule within the LEFT parotid glands favored small primary parotid neoplasm. 4. No evidence thoracic metastasis   09/24/2020 - 10/30/2020 Chemotherapy   He received weekly cisplatin   01/31/2021 PET scan   Findings of worsening nodal disease with a node or group of nodes in the LEFT neck and a RIGHT supraclavicular lymph node as discussed.   Post treatment changes with soft tissue fullness in the area of the prior tumor but with markedly diminished metabolic activity.     03/11/2021 Imaging   Ct neck  Diffuse pharyngeal and laryngeal mucosal and submucosal edema probably related to acute radiation injury. The airway is narrow and could possibly be critically narrowed. I do not see any evidence of recurrent mucosal or submucosal tumor identifiable.   Similar appearance of the recurrent malignant lymphadenopathy on the left at level 2 and level 3 and on the right in the low supraclavicular region just above and behind the clavicular head.   04/12/2021 Imaging   Ct abdomen and pelvis  1. Contrast material in the peritoneal cavity of the upper abdomen suggesting leak along the gastrostomy tract. 2. Gastrostomy tube is appropriately positioned. No evidence of abscess. 3. 1.3 cm right middle lobe nodule, new since 01/31/2021. Favor infectious/inflammatory over primary/secondary neoplasm given the relatively short time frame of development.   04/12/2021 Surgery   Postoperative Diagnosis: Perforated duodenal ulcer (1st  portion)   Surgical Procedure:  Modified Graham patch of duodenal ulcer Gastrostomy tube exchange   Operative Team Members:  Surgeon(s) and Role:    * Stechschulte, Nickola Major, MD - Primary     Drains:  (19 Fr) Jackson-Pratt drain(s) with closed bulb suction in the right upper quadrant near the duodenal ulcer repair and Gastrostomy Tube 26 fr   05/09/2021 Imaging   1. Progression of lesion in the right tonsil  with central fluid collection and peripheral enhancement. This was a site of prior recurrent sick could represent progressive tumor or abscess. Continued follow-up recommended. 2. Progressive malignant lymphadenopathy in the left neck. Stable lymphadenopathy right supraclavicular region. 3. New area of stranding in the superior segment left lower lobe but certain etiology. Attention on follow-up recommended.     06/13/2021 - 11/13/2021 Chemotherapy   Patient is on Treatment Plan :  Paclitaxel + Carboplatin q7d     06/13/2021 - 11/13/2021 Chemotherapy   Patient is on Treatment Plan : HEAD/NECK Pembrolizumab Q21D     07/18/2021 Imaging   Ct chest 1. No highly specific findings identified to suggest pulmonary or nodal metastasis within the chest 2. Multifocal patchy areas of ground-glass and nodular airspace densities. Surrounding postinflammatory changes are noted with thickening of the peribronchovascular interstitium. Imaging findings are concerning for sequelae of aspiration and/or multifocal infection. Follow-up imaging after appropriate antibiotic therapy is recommended with repeat CT of the chest in 3 months to ensure resolution and to rule out the possibility of underlying metastatic nodule. 3. Coronary artery calcifications. 4. Aortic Atherosclerosis (ICD10-I70.0) and Emphysema (ICD10-J43.9).   07/18/2021 Imaging   CT neck  1. Positive treatment response with decreased size of nodal metastases in the left jugular chain. There is likely residual active tumor in the treated nodes. 2. Ulcerated lesions in the bilateral palatine fossa, progressed on the left and suspicious for synchronous tumors.   07/23/2021 Cancer Staging   Staging form: Larynx - Supraglottis, AJCC 8th Edition - Clinical stage from 07/23/2021: Stage IVA (rcT2, cN2b, cM0) - Signed by [name], MD on 07/23/2021 Stage prefix: Recurrence   09/19/2021 Imaging   IMPRESSION: 1. Positive treatment response at the cervical lymph  nodes. No residual nodal disease identified currently. 2. Persisting bilateral palatine tonsil lesion with mild size increase on the right.     09/19/2021 Imaging   IMPRESSION: No new significant lymphadenopathy seen in the mediastinum.   There is interval increase in infiltrates at multiple sites in both lungs as described in the body of the report. There is interval clearing of small patchy infiltrates in right lower lobe. Findings suggest possible waxing and waning multifocal pneumonia.   There are few subcentimeter nodules as described in the body of the report which may be part of pneumonia or neoplastic process. Short-term follow-up CT in 3 months may be considered. Coronary artery calcifications are seen. There is ectasia of ascending thoracic aorta.   01/07/2022 - 02/25/2022 Chemotherapy   Patient is on Treatment Plan : HEAD/NECK Cetuximab q7d       CURRENT THERAPY: cetuiximab  INTERVAL HISTORY: [name] 71 y.o. male returns for follow-up and evaluation of his supraglottic cancer.  He is accompanied by his sister and friend.  He tells me that today he has a headache and his blood pressure is elevated.  He underwent CT neck on March 04, 2022 that showed progressive bilateral palatine tonsil tumor especially on the left where the lesion has become indistinguishable from level 2 lymphadenopathy; CT chest  from same day that shows decreased and irregular nodular opacity of the lateral segment of the right middle lobe and unchanged consolidation of the left lung base without residual nodularity.  His weight has decreased by about 4 pounds in the past month and he is feeding with [employer] [place] 2 cartons every feeding which occurs about 3-4 times a day.  He has some skin pustular lesions on his face since beginning the cetuximab and wants to know if there is anything he can use to help decrease this.  His care companions are concerned about his disease progression and what this means  for future treatment.   Patient Active Problem List   Diagnosis Date Noted   Goals of care, counseling/discussion 10/11/2021   Pneumonitis 09/23/2021   Hypomagnesemia 08/05/2021   Dysphagia 07/24/2021   Neuropathy due to chemotherapeutic drug ([place]) 07/04/2021   Weight loss, unintentional 06/20/2021   Protein-calorie malnutrition, severe 04/17/2021   Perforated gastric ulcer ([place])    Gastrostomy tube dysfunction ([place]) 04/12/2021   Anemia due to antineoplastic chemotherapy 03/26/2021   Cancer related pain 03/26/2021   Laryngeal edema 03/11/2021   Nausea and vomiting 01/24/2021   Shortness of breath 01/10/2021   Mucositis due to antineoplastic therapy 01/10/2021   G tube feedings ([place]) 01/10/2021   Chemotherapy induced diarrhea 10/30/2020   Port-A-Cath in place 10/23/2020   Chronic hepatitis C without hepatic coma ([place]) 06/09/2019   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  MEDICAL HISTORY: Past Medical History:  Diagnosis Date   Cancer ([place])    Throat cancer 2019   ETOH abuse    Frequent urination    Glaucoma    Hepatitis C virus infection without hepatic coma    dx'ed in 11/2018   History of radiation therapy 05/12/19- 07/06/19   Larynx   Hypertension    Wears denture    upper only; lost lower denture    SURGICAL HISTORY: Past Surgical History:  Procedure Laterality Date   ANKLE SURGERY  2011   right ankle   COLONOSCOPY  02/2019   polyps - Dr Havery Moros   DIRECT LARYNGOSCOPY N/A 04/01/2019   Procedure: DIRECT LARYNGOSCOPY WITH BIOPSY;  Surgeon: Jodi Marble, MD;  Location: [facility];  Service: ENT;  Laterality: N/A;   DIRECT LARYNGOSCOPY N/A 01/06/2020   Procedure: DIRECT LARYNGOSCOPY;  Surgeon: Izora Gala, MD;  Location: [facility];  Service: ENT;  Laterality: N/A;   DIRECT LARYNGOSCOPY N/A 08/13/2020   Procedure: DIRECT LARYNGOSCOPY;  Surgeon: Izora Gala, MD;  Location: [facility];  Service: ENT;   Laterality: N/A;   ESOPHAGOSCOPY N/A 04/01/2019   Procedure: ESOPHAGOSCOPY;  Surgeon: Jodi Marble, MD;  Location: [facility];  Service: ENT;  Laterality: N/A;   EXCISION ORAL TUMOR Right 08/13/2020   Procedure: BIOPSY OF OROPHARYNGEAL MASS;  Surgeon: Izora Gala, MD;  Location: [facility];  Service: ENT;  Laterality: Right;   EYE SURGERY Right    IR CM INJ ANY COLONIC TUBE W/FLUORO  12/20/2020   IR CM INJ ANY COLONIC TUBE W/FLUORO  03/20/2021   IR GASTROSTOMY TUBE MOD SED  09/24/2020   IR IMAGING GUIDED PORT INSERTION  09/24/2020   IR RADIOLOGIST EVAL & MGMT  07/02/2021   IR [place] GASTRO/COLONIC TUBE PERCUT W/FLUORO  01/10/2022   KNEE SURGERY     LAPAROTOMY N/A 04/12/2021   Procedure: EXPLORATORY LAPAROTOMY, REPAIR OF DUODENAL ULCER WITH Silvestre Gunner;  Surgeon: Felicie Morn, MD;  Location: [facility];  Service: General;  Laterality: N/A;   LARYNGOSCOPY AND BRONCHOSCOPY N/A 04/01/2019   Procedure: BRONCHOSCOPY;  Surgeon: Jodi Marble, MD;  Location: [facility];  Service: ENT;  Laterality: N/A;   RADICAL NECK DISSECTION N/A 01/06/2020   Procedure: RADICAL NECK DISSECTION;  Surgeon: Izora Gala, MD;  Location: [facility];  Service: ENT;  Laterality: N/A;    SOCIAL HISTORY: Social History   Socioeconomic History   Marital status: Single    Spouse name: Not on file   Number of children: 2   Years of education: Not on file   Highest education level: Not on file  Occupational History   Not on file  Tobacco Use   Smoking status: Former    Years: 50.00    Types: Cigarettes    Quit date: 06/01/2020    Years since quitting: 1.7   Smokeless tobacco: Never  Vaping Use   Vaping Use: Never used  Substance and Sexual Activity   Alcohol use: Yes    Alcohol/week: 4.0 standard drinks of alcohol    Types: 4 Cans of beer per week    Comment: 40oz beer daily   Drug use: Yes    Types: Cocaine    Comment: none in 2 yrs   Sexual activity: Yes    Partners: Female  Other Topics Concern   Not  on file  Social History Narrative   Patient is divorced with 2 children.   Patient is currently living with his sister.   Patient with a history of smoking a third of pack of cigarettes daily for 50 years.  Patient currently smoking 2 to 3 cigarettes/day.   Patient has never used smokeless tobacco.   Patient with occasional use of alcohol.   Patient last used cocaine approximately 6 months ago.  Patient denies use of marijuana.   Social Determinants of Health   Financial Resource Strain: Not on file  Food Insecurity: No Food Insecurity (09/21/2020)   Hunger Vital Sign    Worried About Running Out of Food in the Last Year: Never true    Ran Out of Food in the Last Year: Never true  Transportation Needs: Unmet Transportation Needs (03/05/2022)   PRAPARE - Hydrologist (Medical): Not on file    Lack of Transportation (Non-Medical): Yes  Physical Activity: Not on file  Stress: Not on file  Social Connections: Unknown (09/21/2020)   Social Connection and Isolation Panel [NHANES]    Frequency of Communication with Friends and Family: Three times a week    Frequency of Social Gatherings with Friends and Family: More than three times a week    Attends Religious Services: Not on file    Active Member of Clubs or Organizations: Not on file    Attends Archivist Meetings: Not on file    Marital Status: Not on file  Intimate Partner Violence: Not At Risk (04/27/2019)   Humiliation, Afraid, Rape, and Kick questionnaire    Fear of Current or Ex-Partner: No    Emotionally Abused: No    Physically Abused: No    Sexually Abused: No    FAMILY HISTORY: Family History  Problem Relation Age of Onset   Breast cancer Sister    Colon cancer Brother 40       ????   Cancer Brother     Review of Systems  Constitutional:  Positive for fatigue. Negative for appetite change, chills, fever and unexpected weight change.  HENT:  Negative for hearing loss, lump/mass  and trouble swallowing.   Eyes:  Negative for eye problems and icterus.  Respiratory:  Negative for chest tightness, cough and shortness of breath.   Cardiovascular:  Negative for chest pain, leg swelling and palpitations.  Gastrointestinal:  Negative for abdominal distention, abdominal pain, constipation, diarrhea, nausea and vomiting.  Endocrine: Negative for hot flashes.  Genitourinary:  Negative for difficulty urinating.   Musculoskeletal:  Negative for arthralgias.  Skin:  Negative for itching and rash.  Neurological:  Negative for dizziness, extremity weakness, headaches and numbness.  Hematological:  Negative for adenopathy. Does not bruise/bleed easily.  Psychiatric/Behavioral:  Negative for depression. The patient is not nervous/anxious.       PHYSICAL EXAMINATION  ECOG PERFORMANCE STATUS: 1 - Symptomatic but completely ambulatory  Vitals:   03/11/22 1130  BP: (!) 193/111  Pulse: 84  Resp: 17  Temp: 98.1 F (36.7 C)  SpO2: 100%    Physical Exam Constitutional:      General: He is not in acute distress.    Appearance: Normal appearance. He is not toxic-appearing.     Comments: thin  HENT:     Head: Normocephalic and atraumatic.     Comments: Tracheostomy in place and capped Eyes:     General: No scleral icterus. Cardiovascular:     Rate and Rhythm: Normal rate and regular rhythm.     Pulses: Normal pulses.     Heart sounds: Normal heart sounds.  Pulmonary:     Effort: Pulmonary effort is normal.     Breath sounds: Normal breath sounds.  Abdominal:     General: Abdomen is flat. Bowel sounds are normal. There is no distension.     Palpations: Abdomen is soft.     Tenderness: There is no abdominal tenderness.  Musculoskeletal:        General: No swelling.     Cervical back: Neck supple.  Lymphadenopathy:     Cervical: No cervical adenopathy.  Skin:    General: Skin is warm and dry.     Findings: No rash.  Neurological:     General: No focal deficit  present.     Mental Status: He is alert.  Psychiatric:        Mood and Affect: Mood normal.        Behavior: Behavior normal.     LABORATORY DATA:  CBC    Component Value Date/Time   WBC 5.6 03/11/2022 1101   RBC 3.35 (L) 03/11/2022 1101   HGB 10.7 (L) 03/11/2022 1101   HGB 10.2 (L) 11/01/2021 1157   HCT 31.6 (L) 03/11/2022 1101   PLT 293 03/11/2022 1101   PLT 222 11/01/2021 1157   MCV 94.3 03/11/2022 1101   MCH 31.9 03/11/2022 1101   MCHC 33.9 03/11/2022 1101   RDW 12.5 03/11/2022 1101   LYMPHSABS 0.8 03/11/2022 1101   MONOABS 0.5 03/11/2022 1101   EOSABS 0.2 03/11/2022 1101   BASOSABS 0.0 03/11/2022 1101    CMP     Component Value Date/Time   NA 135 03/11/2022 1101   K 3.7 03/11/2022 1101   CL 97 (L) 03/11/2022 1101   CO2 30 03/11/2022 1101   GLUCOSE 104 (H) 03/11/2022 1101   BUN 10 03/11/2022 1101   CREATININE 0.52 (L) 03/11/2022 1101   CREATININE 0.54 (L) 11/01/2021 1157   CREATININE 0.61 (L) 10/26/2019 1523   CALCIUM 9.6 03/11/2022 1101   PROT 7.7 03/11/2022 1101   ALBUMIN 4.1 03/11/2022 1101   AST  13 (L) 03/11/2022 1101   AST 15 11/01/2021 1157   ALT 9 03/11/2022 1101   ALT 9 11/01/2021 1157   ALT 53 (H) 09/06/2019 1543   ALKPHOS 49 03/11/2022 1101   BILITOT 0.4 03/11/2022 1101   BILITOT 0.4 11/01/2021 1157   GFRNONAA >60 03/11/2022 1101   GFRNONAA >60 11/01/2021 1157   GFRAA >60 01/03/2020 1355   GFRAA >60 05/04/2019 1207      ASSESSMENT and THERAPY PLAN:   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  He is status post direct laryngoscopy with debulking, radiation, neck dissection, cisplatin; Taxol, carbo; pembrolizumab; and now cetuximab.  After discussion of the scans and lines of treatment that Raequan has progressed through I discussed with Dr. Chryl Heck and she recommends that he see hematology and oncology at [facility] to identify other potential  treatment opportunities.  I sent in [place] for his lesions that are likely secondary to cetuximab.  Now that he is stopping cetuximab they should improve.  His magnesium was 1.4 today and he will receive this IV.  Please see Dr. Rob Hickman addendum for further details about her discussion with him.    All questions were answered. The patient knows to call the clinic with any problems, questions or concerns. We can certainly see the patient much sooner if necessary.     Wilber Bihari, NP 03/12/22 3:09 PM Medical Oncology and Hemat[facility] [place], [place] [address] Tel. [phone]    Fax. [phone]  *Total Encounter Time as defined by the Centers for Medicare and Medicaid Services includes, in addition to the face-to-face time of a patient visit (documented in the note above) non-face-to-face time: obtaining and reviewing outside history, ordering and reviewing medications, tests or procedures, care coordination (communications with other health care professionals or caregivers) and documentation in the medical record.   I have seen this patient yesterday with Mendel Ryder.  We have discussed that his most recent imaging is concerning for progression in the neck.  Unfortunately he has progressed through multiple lines of treatment and has performance status continues to deteriorate.  He also has a Futures trader at [facility] hence I have recommended that he follow-up with medical oncology team there for consideration of any clinical trial. He and his family expressed understanding of the recommendations. I have recommended that he return to clinic in a couple weeks to review recommendations from [facility] and to discuss further lines of treatment.  Total time spent: 30 minutes including history, review of records, review of imaging, counseling about options and coordination of care

## 2022-03-12 ENCOUNTER — Telehealth: Payer: Self-pay | Admitting: Adult Health

## 2022-03-12 ENCOUNTER — Encounter: Payer: Self-pay | Admitting: Hematology and Oncology

## 2022-03-12 NOTE — Telephone Encounter (Signed)
Scheduled appointment per 6/27 los. Left voicemail message with the patients sister [name].

## 2022-03-12 NOTE — Assessment & Plan Note (Signed)
[name] is a 71 year old man with squamous cell carcinoma of the right supraglottis.  He is status post direct laryngoscopy with debulking, radiation, neck dissection, cisplatin; Taxol, carbo; pembrolizumab; and now cetuximab.  After discussion of the scans and lines of treatment that [name] has progressed through I discussed with Dr. Chryl Heck and she recommends that he see hematology and oncology at [facility] to identify other potential treatment opportunities.  I sent in [place] for his lesions that are likely secondary to cetuximab.  Now that he is stopping cetuximab they should improve.  His magnesium was 1.4 today and he will receive this IV.  Please see Dr. Rob Hickman addendum for further details about her discussion with him.

## 2022-03-13 ENCOUNTER — Telehealth: Payer: Self-pay

## 2022-03-13 NOTE — Telephone Encounter (Signed)
Pt's caregiver called to ask about pt's appt scheduled for 03/19/22. Asks why this is scheduled. Advised pt per [place] on 6/27, she wants pt to f/u with MD 03/19/22. Caregiver verbalized thanks and understanding.

## 2022-03-17 DIAGNOSIS — Z9221 Personal history of antineoplastic chemotherapy: Secondary | ICD-10-CM | POA: Diagnosis not present

## 2022-03-17 DIAGNOSIS — F17210 Nicotine dependence, cigarettes, uncomplicated: Secondary | ICD-10-CM | POA: Diagnosis not present

## 2022-03-17 DIAGNOSIS — C109 Malignant neoplasm of oropharynx, unspecified: Secondary | ICD-10-CM | POA: Diagnosis not present

## 2022-03-17 DIAGNOSIS — C329 Malignant neoplasm of larynx, unspecified: Secondary | ICD-10-CM | POA: Diagnosis not present

## 2022-03-17 DIAGNOSIS — Z923 Personal history of irradiation: Secondary | ICD-10-CM | POA: Diagnosis not present

## 2022-03-17 DIAGNOSIS — Z930 Tracheostomy status: Secondary | ICD-10-CM | POA: Diagnosis not present

## 2022-03-19 ENCOUNTER — Inpatient Hospital Stay: Payer: Medicare HMO | Admitting: Hematology and Oncology

## 2022-03-19 DIAGNOSIS — C760 Malignant neoplasm of head, face and neck: Secondary | ICD-10-CM | POA: Diagnosis not present

## 2022-03-25 ENCOUNTER — Telehealth: Payer: Self-pay | Admitting: Hematology and Oncology

## 2022-03-25 ENCOUNTER — Inpatient Hospital Stay: Payer: Medicare HMO | Admitting: Hematology and Oncology

## 2022-03-25 NOTE — Telephone Encounter (Signed)
Per 7/11 phone line pt's caregiver called to reschedule appointment.  She picked the day and time and appointment was rescheduled

## 2022-03-25 NOTE — Progress Notes (Deleted)
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 SEE NOTE   04/01/2019 Surgery   Pre-Op Dx:   right supraglottic carcinoma   Post-op Dx: T2 N0 M0 (stage II) right supraglottic carcinoma   Proc: Direct laryngoscopy with biopsy.  Cervical esophagoscopy.  Bronchoscopy.   Surg:  [name]    Findings: A bulky necrotic and semi-pedunculated tumor coming off the lateral surface of the right aryepiglottic fold and the anterior and lateral aspect of the piriform sinus on the right side.  Airway was compromised by the tumor mass at intubation.   04/11/2019 Imaging   CT neck: IMPRESSION: The glottis is closed with suboptimal evaluation of the vocal cords. No asymmetry of the cords. Correlate with laryngoscopy results.   No enlarged lymph nodes in the neck. 7 mm right level 2 lymph node may be reactive. No definite pathologic lymph nodes in the neck.   04/11/2019 Imaging   CT neck  This CT was performed after surgical debulking of an exophytic laryngeal tumor.   Most apparent on series 2, image 51 there is abnormal asymmetric  hyperenhancement along the surface of the right piriform sinus, and perhaps also involving the bilateral AE folds.   04/22/2019 Imaging   PET: IMPRESSION: 1. Mild residual activity in the posterior RIGHT hypopharynx confined to the mucosa. 2. No evidence of hypermetabolic metastatic lymph nodes in LEFT or RIGHT neck. 3. No evidence distant metastatic disease.   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 New FDG avid right level 2 cervical lymph node concerning for recurrent disease. No findings of distant metastatic disease within the chest, abdomen or pelvis. 2. Mild nonspecific increased uptake in the right side of larynx is similar to previous exam. 3. Aortic Atherosclerosis (ICD10-I70.0). Coronary artery calcifications.    11/21/2019 Pathology Results   SURGICAL PATHOLOGY  CASE: [id number]  [name]  Surgical Pathology Report   FINAL MICROSCOPIC DIAGNOSIS:   A. LYMPH NODE, RIGHT CERVICAL:  - Poorly differentiated carcinoma consistent with metastatic squamous cell carcinoma.  - See comment   01/06/2020 Pathology Results   SURGICAL PATHOLOGY  CASE: [id number]  [name]  Surgical Pathology Report  Clinical History: metastatic Adams to cervical lymph nodes (cm)   FINAL MICROSCOPIC DIAGNOSIS:   A. LYMPH NODE, RIGHT MODIFIED, DISSECTION:  - Metastatic squamous cell carcinoma in four of nineteen lymph nodes (4/19).  - Largest metastatic nodule is 3.1 cm with 5 mm  extra-nodal extension.  - Lymphovascular space involvement in peri-nodal connective tissue.  - See comment.    01/06/2020 Surgery   PRE-OPERATIVE DIAGNOSIS:  METASTATIC Adams TO CERVICAL LYMPH NODES   POST-OPERATIVE DIAGNOSIS:  METASTATIC Adams TO CERVICAL LYMPH NODES   PROCEDURE:  Procedure(s): DIRECT LARYNGOSCOPY MODIFIED RADICAL NECK DISSECTION   SURGEON:  [name], MD     SPECIMEN: Right modified neck dissection including levels 2 and 3, including the internal jugular vein, suture marks the inferior jugular vein stump.   07/05/2020 Imaging   1. Sequelae of radiation and right neck dissection from the previous contrasted Neck CT.   2. Asymmetric soft tissue thickening and enhancement along the right lateral pharynx. Although this might be asymmetric mucositis, the appearance is suspicious and recommend direct inspection. NI-RADS category 2a.   3. Suspicious small 6 mm hyperenhancing nodular soft tissue along the posterior margin of the neck dissection at right level 3. NI-RADS category 2 vs 3 - although this is likely too small for imaging guided biopsy. Repeat PET-CT may be most valuable.   4. Mild inflammatory appearing right upper lobe centrilobular ground-glass opacity, new since February. Consider mild or developing right upper lobe infection. Post radiation changes to the lung apices.       08/13/2020 Pathology Results   A. OROPHARNYX, BIOPSY:  - Poorly differentiated squamous cell carcinoma.  - See comment.    08/13/2020 Surgery   POST-OPERATIVE DIAGNOSIS:  Tonsillary Mass History of Laryngeal  Adams   PROCEDURE:  Procedure(s): DIRECT LARYNGOSCOPY BIOPSY OF OROPHARYNGEAL MASS   08/30/2020 PET scan   1. Recurrence of head neck carcinoma with a broad hypermetabolic pharyngeal mucosal lesion involving the RIGHT lateral posterior oropharynx and hypopharynx extending from the palatine tonsil to the vallecula. 2. Hypermetabolic lymph node posterior  sternocleidomastoid muscle on the RIGHT (level 3). 3. Hypermetabolic nodule within the LEFT parotid glands favored small primary parotid neoplasm. 4. No evidence thoracic metastasis   09/24/2020 - 10/30/2020 Chemotherapy   He received weekly cisplatin   01/31/2021 PET scan   Findings of worsening nodal disease with a node or group of nodes in the LEFT neck and a RIGHT supraclavicular lymph node as discussed.   Post treatment changes with soft tissue fullness in the area of the prior tumor but with markedly diminished metabolic activity.     03/11/2021 Imaging   Ct neck  Diffuse pharyngeal and laryngeal mucosal and submucosal edema probably related to acute radiation injury. The airway is narrow and could possibly be critically narrowed. I do not see any evidence of recurrent mucosal or submucosal tumor identifiable.   Similar appearance of the recurrent malignant lymphadenopathy on the left at level 2 and level 3 and on the right in the low supraclavicular region just above and behind the clavicular head.   04/12/2021 Imaging   Ct abdomen and pelvis  1. Contrast material in the peritoneal cavity of the upper abdomen suggesting leak along the gastrostomy tract. 2. Gastrostomy tube is appropriately positioned. No evidence of abscess. 3. 1.3 cm right middle lobe nodule, new since 01/31/2021. Favor infectious/inflammatory over primary/secondary neoplasm given the relatively short time frame of development.   04/12/2021 Surgery   Postoperative Diagnosis: Perforated duodenal ulcer (1st  portion)   Surgical Procedure:  Modified Graham patch of duodenal ulcer Gastrostomy tube exchange   Operative Team Members:  Surgeon(s) and Role:    * Stechschulte, Nickola Major, MD - Primary     Drains:  (19 Fr) Jackson-Pratt drain(s) with closed bulb suction in the right upper quadrant near the duodenal ulcer repair and Gastrostomy Tube 26 fr   05/09/2021 Imaging   1. Progression of lesion in the right tonsil  with central fluid collection and peripheral enhancement. This was a site of prior recurrent sick could represent progressive tumor or abscess. Continued follow-up recommended. 2. Progressive malignant lymphadenopathy in the left neck. Stable lymphadenopathy right supraclavicular region. 3. New area of stranding in the superior segment left lower lobe but certain etiology. Attention on follow-up recommended.     06/13/2021 - 11/13/2021 Chemotherapy   Patient is on Treatment Plan :  Paclitaxel + Carboplatin q7d     06/13/2021 - 11/13/2021 Chemotherapy   Patient is on Treatment Plan : HEAD/NECK Pembrolizumab Q21D     07/18/2021 Imaging   Ct chest 1. No highly specific findings identified to suggest pulmonary or nodal metastasis within the chest 2. Multifocal patchy areas of ground-glass and nodular airspace densities. Surrounding postinflammatory changes are noted with thickening of the peribronchovascular interstitium. Imaging findings are concerning for sequelae of aspiration and/or multifocal infection. Follow-up imaging after appropriate antibiotic therapy is recommended with repeat CT of the chest in 3 months to ensure resolution and to rule out the possibility of underlying metastatic nodule. 3. Coronary artery calcifications. 4. Aortic Atherosclerosis (ICD10-I70.0) and Emphysema (ICD10-J43.9).   07/18/2021 Imaging   CT neck  1. Positive treatment response with decreased size of nodal metastases in the left jugular chain. There is likely residual active tumor in the treated nodes. 2. Ulcerated lesions in the bilateral palatine fossa, progressed on the left and suspicious for synchronous tumors.   07/23/2021 Adams Staging   Staging form: Larynx - Supraglottis, AJCC 8th Edition - Clinical stage from 07/23/2021: Stage IVA (rcT2, cN2b, cM0) - Signed by [name], MD on 07/23/2021 Stage prefix: Recurrence   09/19/2021 Imaging   IMPRESSION: 1. Positive treatment response at the cervical lymph  nodes. No residual nodal disease identified currently. 2. Persisting bilateral palatine tonsil lesion with mild size increase on the right.     09/19/2021 Imaging   IMPRESSION: No new significant lymphadenopathy seen in the mediastinum.   There is interval increase in infiltrates at multiple sites in both lungs as described in the body of the report. There is interval clearing of small patchy infiltrates in right lower lobe. Findings suggest possible waxing and waning multifocal pneumonia.   There are few subcentimeter nodules as described in the body of the report which may be part of pneumonia or neoplastic process. Short-term follow-up CT in 3 months may be considered. Coronary artery calcifications are seen. There is ectasia of ascending thoracic aorta.   01/07/2022 - 02/25/2022 Chemotherapy   Patient is on Treatment Plan : HEAD/NECK Cetuximab q7d       CURRENT THERAPY: cetuiximab  INTERVAL HISTORY: OSHAE SIMMERING 71 y.o. male returns for follow-up and evaluation of his supraglottic Adams.   He saw Dr. Lollie Sails at [facility] for further recommendations.  I do not see a note from the doctor yet but it appears that they may have recommended lenvatinib per a follow-up telephone visit.   Patient Active Problem List   Diagnosis Date Noted   Goals of care, counseling/discussion 10/11/2021   Pneumonitis  09/23/2021   Hypomagnesemia 08/05/2021   Dysphagia 07/24/2021   Neuropathy due to chemotherapeutic drug ([place]) 07/04/2021   Weight loss, unintentional 06/20/2021   Protein-calorie malnutrition, severe 04/17/2021   Perforated gastric ulcer ([place])    Gastrostomy tube dysfunction ([place]) 04/12/2021   Anemia due to antineoplastic chemotherapy 03/26/2021   Adams related pain 03/26/2021   Laryngeal edema 03/11/2021   Nausea and vomiting 01/24/2021   Shortness of breath 01/10/2021   Mucositis due to antineoplastic therapy 01/10/2021   G tube feedings ([place]) 01/10/2021   Chemotherapy induced  diarrhea 10/30/2020   Port-A-Cath in place 10/23/2020   Chronic hepatitis C without hepatic coma ([place]) 06/09/2019   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  MEDICAL HISTORY: Past Medical History:  Diagnosis Date   Adams ([place])    Throat Adams 2019   ETOH abuse    Frequent urination    Glaucoma    Hepatitis C virus infection without hepatic coma    dx'ed in 11/2018   History of radiation therapy 05/12/19- 07/06/19   Larynx   Hypertension    Wears denture    upper only; lost lower denture    SURGICAL HISTORY: Past Surgical History:  Procedure Laterality Date   ANKLE SURGERY  2011   right ankle   COLONOSCOPY  02/2019   polyps - Dr Havery Moros   DIRECT LARYNGOSCOPY N/A 04/01/2019   Procedure: DIRECT LARYNGOSCOPY WITH BIOPSY;  Surgeon: Jodi Marble, MD;  Location: [facility];  Service: ENT;  Laterality: N/A;   DIRECT LARYNGOSCOPY N/A 01/06/2020   Procedure: DIRECT LARYNGOSCOPY;  Surgeon: Izora Gala, MD;  Location: [facility];  Service: ENT;  Laterality: N/A;   DIRECT LARYNGOSCOPY N/A 08/13/2020   Procedure: DIRECT LARYNGOSCOPY;  Surgeon: Izora Gala, MD;  Location: [facility];  Service: ENT;  Laterality: N/A;   ESOPHAGOSCOPY N/A 04/01/2019   Procedure: ESOPHAGOSCOPY;  Surgeon: Jodi Marble, MD;  Location: [facility];  Service: ENT;  Laterality: N/A;   EXCISION ORAL TUMOR Right 08/13/2020   Procedure: BIOPSY OF OROPHARYNGEAL MASS;  Surgeon: Izora Gala, MD;  Location: [facility];  Service: ENT;  Laterality: Right;   EYE SURGERY Right    IR CM INJ ANY COLONIC TUBE W/FLUORO  12/20/2020   IR CM INJ ANY COLONIC TUBE W/FLUORO  03/20/2021   IR GASTROSTOMY TUBE MOD SED  09/24/2020   IR IMAGING GUIDED PORT INSERTION  09/24/2020   IR RADIOLOGIST EVAL & MGMT  07/02/2021   IR [place] GASTRO/COLONIC TUBE PERCUT W/FLUORO  01/10/2022   KNEE SURGERY     LAPAROTOMY N/A 04/12/2021   Procedure: EXPLORATORY LAPAROTOMY, REPAIR  OF DUODENAL ULCER WITH Silvestre Gunner;  Surgeon: Felicie Morn, MD;  Location: [facility];  Service: General;  Laterality: N/A;   LARYNGOSCOPY AND BRONCHOSCOPY N/A 04/01/2019   Procedure: BRONCHOSCOPY;  Surgeon: Jodi Marble, MD;  Location: [facility];  Service: ENT;  Laterality: N/A;   RADICAL NECK DISSECTION N/A 01/06/2020   Procedure: RADICAL NECK DISSECTION;  Surgeon: Izora Gala, MD;  Location: [facility];  Service: ENT;  Laterality: N/A;    SOCIAL HISTORY: Social History   Socioeconomic History   Marital status: Single    Spouse name: Not on file   Number of children: 2   Years of education: Not on file   Highest education level: Not on file  Occupational History   Not on file  Tobacco Use   Smoking status: Former  Years: 50.00    Types: Cigarettes    Quit date: 06/01/2020    Years since quitting: 1.8   Smokeless tobacco: Never  Vaping Use   Vaping Use: Never used  Substance and Sexual Activity   Alcohol use: Yes    Alcohol/week: 4.0 standard drinks of alcohol    Types: 4 Cans of beer per week    Comment: 40oz beer daily   Drug use: Yes    Types: Cocaine    Comment: none in 2 yrs   Sexual activity: Yes    Partners: Female  Other Topics Concern   Not on file  Social History Narrative   Patient is divorced with 2 children.   Patient is currently living with his sister.   Patient with a history of smoking a third of pack of cigarettes daily for 50 years.  Patient currently smoking 2 to 3 cigarettes/day.   Patient has never used smokeless tobacco.   Patient with occasional use of alcohol.   Patient last used cocaine approximately 6 months ago.  Patient denies use of marijuana.   Social Determinants of Health   Financial Resource Strain: Not on file  Food Insecurity: No Food Insecurity (09/21/2020)   Hunger Vital Sign    Worried About Running Out of Food in the Last Year: Never true    Ran Out of Food in the Last Year: Never true  Transportation Needs: Unmet  Transportation Needs (03/05/2022)   PRAPARE - Hydrologist (Medical): Not on file    Lack of Transportation (Non-Medical): Yes  Physical Activity: Not on file  Stress: Not on file  Social Connections: Unknown (09/21/2020)   Social Connection and Isolation Panel [NHANES]    Frequency of Communication with Friends and Family: Three times a week    Frequency of Social Gatherings with Friends and Family: More than three times a week    Attends Religious Services: Not on file    Active Member of Clubs or Organizations: Not on file    Attends Archivist Meetings: Not on file    Marital Status: Not on file  Intimate Partner Violence: Not At Risk (04/27/2019)   Humiliation, Afraid, Rape, and Kick questionnaire    Fear of Current or Ex-Partner: No    Emotionally Abused: No    Physically Abused: No    Sexually Abused: No    FAMILY HISTORY: Family History  Problem Relation Age of Onset   Breast Adams Sister    Colon Adams Brother 34       ????   Adams Brother     Review of Systems  Constitutional:  Positive for fatigue. Negative for appetite change, chills, fever and unexpected weight change.  HENT:   Negative for hearing loss, lump/mass and trouble swallowing.   Eyes:  Negative for eye problems and icterus.  Respiratory:  Negative for chest tightness, cough and shortness of breath.   Cardiovascular:  Negative for chest pain, leg swelling and palpitations.  Gastrointestinal:  Negative for abdominal distention, abdominal pain, constipation, diarrhea, nausea and vomiting.  Endocrine: Negative for hot flashes.  Genitourinary:  Negative for difficulty urinating.   Musculoskeletal:  Negative for arthralgias.  Skin:  Negative for itching and rash.  Neurological:  Negative for dizziness, extremity weakness, headaches and numbness.  Hematological:  Negative for adenopathy. Does not bruise/bleed easily.  Psychiatric/Behavioral:  Negative for depression.  The patient is not nervous/anxious.       PHYSICAL EXAMINATION  ECOG PERFORMANCE  STATUS: 1 - Symptomatic but completely ambulatory  There were no vitals filed for this visit.   Physical Exam Constitutional:      General: He is not in acute distress.    Appearance: Normal appearance. He is not toxic-appearing.     Comments: thin  HENT:     Head: Normocephalic and atraumatic.     Comments: Tracheostomy in place and capped Eyes:     General: No scleral icterus. Cardiovascular:     Rate and Rhythm: Normal rate and regular rhythm.     Pulses: Normal pulses.     Heart sounds: Normal heart sounds.  Pulmonary:     Effort: Pulmonary effort is normal.     Breath sounds: Normal breath sounds.  Abdominal:     General: Abdomen is flat. Bowel sounds are normal. There is no distension.     Palpations: Abdomen is soft.     Tenderness: There is no abdominal tenderness.  Musculoskeletal:        General: No swelling.     Cervical back: Neck supple.  Lymphadenopathy:     Cervical: No cervical adenopathy.  Skin:    General: Skin is warm and dry.     Findings: No rash.  Neurological:     General: No focal deficit present.     Mental Status: He is alert.  Psychiatric:        Mood and Affect: Mood normal.        Behavior: Behavior normal.     LABORATORY DATA:  CBC    Component Value Date/Time   WBC 5.6 03/11/2022 1101   RBC 3.35 (L) 03/11/2022 1101   HGB 10.7 (L) 03/11/2022 1101   HGB 10.2 (L) 11/01/2021 1157   HCT 31.6 (L) 03/11/2022 1101   PLT 293 03/11/2022 1101   PLT 222 11/01/2021 1157   MCV 94.3 03/11/2022 1101   MCH 31.9 03/11/2022 1101   MCHC 33.9 03/11/2022 1101   RDW 12.5 03/11/2022 1101   LYMPHSABS 0.8 03/11/2022 1101   MONOABS 0.5 03/11/2022 1101   EOSABS 0.2 03/11/2022 1101   BASOSABS 0.0 03/11/2022 1101    CMP     Component Value Date/Time   NA 135 03/11/2022 1101   K 3.7 03/11/2022 1101   CL 97 (L) 03/11/2022 1101   CO2 30 03/11/2022 1101   GLUCOSE  104 (H) 03/11/2022 1101   BUN 10 03/11/2022 1101   CREATININE 0.52 (L) 03/11/2022 1101   CREATININE 0.54 (L) 11/01/2021 1157   CREATININE 0.61 (L) 10/26/2019 1523   CALCIUM 9.6 03/11/2022 1101   PROT 7.7 03/11/2022 1101   ALBUMIN 4.1 03/11/2022 1101   AST 13 (L) 03/11/2022 1101   AST 15 11/01/2021 1157   ALT 9 03/11/2022 1101   ALT 9 11/01/2021 1157   ALT 53 (H) 09/06/2019 1543   ALKPHOS 49 03/11/2022 1101   BILITOT 0.4 03/11/2022 1101   BILITOT 0.4 11/01/2021 1157   GFRNONAA >60 03/11/2022 1101   GFRNONAA >60 11/01/2021 1157   GFRAA >60 01/03/2020 1355   GFRAA >60 05/04/2019 1207      ASSESSMENT and THERAPY PLAN:   No problem-specific Assessment & Plan notes found for this encounter.    All questions were answered. The patient knows to call the clinic with any problems, questions or concerns. We can certainly see the patient much sooner if necessary.   *Total Encounter Time as defined by the Centers for Medicare and Medicaid Services includes, in addition to the face-to-face time of  a patient visit (documented in the note above) non-face-to-face time: obtaining and reviewing outside history, ordering and reviewing medications, tests or procedures, care coordination (communications with other health care professionals or caregivers) and documentation in the medical record.  Total time spent: 30 minutes including history, review of records, review of imaging, counseling about options and coordination of care

## 2022-03-25 NOTE — Assessment & Plan Note (Deleted)
[name] is a patient with complicated history of supraglottic tumor with cancer recurrence in the head and neck region last treated with cetuximab and most recent imaging demonstrating progression who is now here for follow-up.  Since his last visit, he followed up with [facility] oncology team and the recommendation was to consider lenvatinib for next line of treatment.  It appears that they may have talked to him about lenvatinib, at this time the consult note from the medical oncologist is not complete.  There is phase 1B/2 trial which has demonstrated activity of lenvatinib with Keytruda in patients with refractory head and neck cancer.  There is currently a phase 3 trial which is ongoing with this combination.

## 2022-03-26 DIAGNOSIS — C109 Malignant neoplasm of oropharynx, unspecified: Secondary | ICD-10-CM | POA: Diagnosis not present

## 2022-03-26 DIAGNOSIS — C329 Malignant neoplasm of larynx, unspecified: Secondary | ICD-10-CM | POA: Diagnosis not present

## 2022-03-26 DIAGNOSIS — C770 Secondary and unspecified malignant neoplasm of lymph nodes of head, face and neck: Secondary | ICD-10-CM | POA: Diagnosis not present

## 2022-03-26 DIAGNOSIS — J358 Other chronic diseases of tonsils and adenoids: Secondary | ICD-10-CM | POA: Diagnosis not present

## 2022-04-07 ENCOUNTER — Other Ambulatory Visit: Payer: Self-pay | Admitting: Hematology and Oncology

## 2022-04-08 ENCOUNTER — Other Ambulatory Visit: Payer: Self-pay | Admitting: Hematology and Oncology

## 2022-04-08 DIAGNOSIS — C760 Malignant neoplasm of head, face and neck: Secondary | ICD-10-CM | POA: Diagnosis not present

## 2022-04-08 MED ORDER — OXYCODONE HCL 5 MG PO TABS: 5.0000 mg | ORAL_TABLET | Freq: Four times a day (QID) | ORAL | 0 refills | Status: DC | PRN

## 2022-04-08 NOTE — Progress Notes (Signed)
Oxycodone refilled.  [name]

## 2022-04-22 DIAGNOSIS — C760 Malignant neoplasm of head, face and neck: Secondary | ICD-10-CM | POA: Diagnosis not present

## 2022-04-22 DIAGNOSIS — D500 Iron deficiency anemia secondary to blood loss (chronic): Secondary | ICD-10-CM | POA: Diagnosis not present

## 2022-04-22 DIAGNOSIS — B9681 Helicobacter pylori [H. pylori] as the cause of diseases classified elsewhere: Secondary | ICD-10-CM | POA: Diagnosis not present

## 2022-04-22 DIAGNOSIS — K921 Melena: Secondary | ICD-10-CM | POA: Diagnosis not present

## 2022-04-22 DIAGNOSIS — E43 Unspecified severe protein-calorie malnutrition: Secondary | ICD-10-CM | POA: Diagnosis not present

## 2022-04-22 DIAGNOSIS — D509 Iron deficiency anemia, unspecified: Secondary | ICD-10-CM | POA: Diagnosis not present

## 2022-04-22 DIAGNOSIS — H409 Unspecified glaucoma: Secondary | ICD-10-CM | POA: Diagnosis not present

## 2022-04-22 DIAGNOSIS — K922 Gastrointestinal hemorrhage, unspecified: Secondary | ICD-10-CM | POA: Diagnosis not present

## 2022-04-22 DIAGNOSIS — C109 Malignant neoplasm of oropharynx, unspecified: Secondary | ICD-10-CM | POA: Diagnosis not present

## 2022-04-22 DIAGNOSIS — D649 Anemia, unspecified: Secondary | ICD-10-CM | POA: Diagnosis not present

## 2022-04-22 DIAGNOSIS — E871 Hypo-osmolality and hyponatremia: Secondary | ICD-10-CM | POA: Diagnosis not present

## 2022-04-22 DIAGNOSIS — T451X5A Adverse effect of antineoplastic and immunosuppressive drugs, initial encounter: Secondary | ICD-10-CM | POA: Diagnosis not present

## 2022-04-22 DIAGNOSIS — K222 Esophageal obstruction: Secondary | ICD-10-CM | POA: Diagnosis not present

## 2022-04-22 DIAGNOSIS — C770 Secondary and unspecified malignant neoplasm of lymph nodes of head, face and neck: Secondary | ICD-10-CM | POA: Diagnosis not present

## 2022-04-22 DIAGNOSIS — C329 Malignant neoplasm of larynx, unspecified: Secondary | ICD-10-CM | POA: Diagnosis not present

## 2022-04-22 DIAGNOSIS — R491 Aphonia: Secondary | ICD-10-CM | POA: Diagnosis not present

## 2022-04-22 DIAGNOSIS — I10 Essential (primary) hypertension: Secondary | ICD-10-CM | POA: Diagnosis not present

## 2022-04-22 DIAGNOSIS — K264 Chronic or unspecified duodenal ulcer with hemorrhage: Secondary | ICD-10-CM | POA: Diagnosis not present

## 2022-04-22 DIAGNOSIS — G893 Neoplasm related pain (acute) (chronic): Secondary | ICD-10-CM | POA: Diagnosis not present

## 2022-04-22 DIAGNOSIS — K9423 Gastrostomy malfunction: Secondary | ICD-10-CM | POA: Diagnosis not present

## 2022-04-22 DIAGNOSIS — Z931 Gastrostomy status: Secondary | ICD-10-CM | POA: Diagnosis not present

## 2022-04-22 DIAGNOSIS — E8809 Other disorders of plasma-protein metabolism, not elsewhere classified: Secondary | ICD-10-CM | POA: Diagnosis not present

## 2022-04-22 DIAGNOSIS — K449 Diaphragmatic hernia without obstruction or gangrene: Secondary | ICD-10-CM | POA: Diagnosis not present

## 2022-04-22 DIAGNOSIS — K269 Duodenal ulcer, unspecified as acute or chronic, without hemorrhage or perforation: Secondary | ICD-10-CM | POA: Diagnosis not present

## 2022-04-22 DIAGNOSIS — R519 Headache, unspecified: Secondary | ICD-10-CM | POA: Diagnosis not present

## 2022-04-22 DIAGNOSIS — B1920 Unspecified viral hepatitis C without hepatic coma: Secondary | ICD-10-CM | POA: Diagnosis not present

## 2022-04-22 DIAGNOSIS — C321 Malignant neoplasm of supraglottis: Secondary | ICD-10-CM | POA: Diagnosis not present

## 2022-04-22 DIAGNOSIS — D6481 Anemia due to antineoplastic chemotherapy: Secondary | ICD-10-CM | POA: Diagnosis not present

## 2022-04-23 DIAGNOSIS — T451X5A Adverse effect of antineoplastic and immunosuppressive drugs, initial encounter: Secondary | ICD-10-CM | POA: Diagnosis not present

## 2022-04-23 DIAGNOSIS — K9423 Gastrostomy malfunction: Secondary | ICD-10-CM | POA: Diagnosis not present

## 2022-04-23 DIAGNOSIS — D6481 Anemia due to antineoplastic chemotherapy: Secondary | ICD-10-CM | POA: Diagnosis not present

## 2022-04-23 DIAGNOSIS — K922 Gastrointestinal hemorrhage, unspecified: Secondary | ICD-10-CM | POA: Diagnosis not present

## 2022-04-24 ENCOUNTER — Inpatient Hospital Stay: Payer: Medicare HMO | Admitting: Hematology and Oncology

## 2022-05-01 ENCOUNTER — Emergency Department (HOSPITAL_COMMUNITY): Payer: Medicare HMO

## 2022-05-01 ENCOUNTER — Emergency Department (HOSPITAL_COMMUNITY)
Admission: EM | Admit: 2022-05-01 | Discharge: 2022-05-01 | Disposition: A | Discharge status: Home / Self Care | Payer: Medicare HMO | Attending: Emergency Medicine | Admitting: Emergency Medicine

## 2022-05-01 ENCOUNTER — Encounter (HOSPITAL_COMMUNITY): Payer: Self-pay

## 2022-05-01 ENCOUNTER — Other Ambulatory Visit: Payer: Self-pay

## 2022-05-01 DIAGNOSIS — Z8521 Personal history of malignant neoplasm of larynx: Secondary | ICD-10-CM | POA: Diagnosis not present

## 2022-05-01 DIAGNOSIS — T7840XA Allergy, unspecified, initial encounter: Secondary | ICD-10-CM | POA: Diagnosis not present

## 2022-05-01 DIAGNOSIS — R0602 Shortness of breath: Secondary | ICD-10-CM | POA: Diagnosis not present

## 2022-05-01 DIAGNOSIS — E871 Hypo-osmolality and hyponatremia: Secondary | ICD-10-CM | POA: Diagnosis not present

## 2022-05-01 DIAGNOSIS — R0600 Dyspnea, unspecified: Secondary | ICD-10-CM | POA: Diagnosis not present

## 2022-05-01 DIAGNOSIS — R609 Edema, unspecified: Secondary | ICD-10-CM | POA: Diagnosis not present

## 2022-05-01 LAB — CBC WITH DIFFERENTIAL/PLATELET
Abs Immature Granulocytes: 0.05 10*3/uL (ref 0.00–0.07)
Basophils Absolute: 0 10*3/uL (ref 0.0–0.1)
Basophils Relative: 1 %
Eosinophils Absolute: 0.1 10*3/uL (ref 0.0–0.5)
Eosinophils Relative: 1 %
HCT: 29.1 % — ABNORMAL LOW (ref 39.0–52.0)
Hemoglobin: 9.4 g/dL — ABNORMAL LOW (ref 13.0–17.0)
Immature Granulocytes: 1 %
Lymphocytes Relative: 19 %
Lymphs Abs: 1.1 10*3/uL (ref 0.7–4.0)
MCH: 31.2 pg (ref 26.0–34.0)
MCHC: 32.3 g/dL (ref 30.0–36.0)
MCV: 96.7 fL (ref 80.0–100.0)
Monocytes Absolute: 0.5 10*3/uL (ref 0.1–1.0)
Monocytes Relative: 10 %
Neutro Abs: 4 10*3/uL (ref 1.7–7.7)
Neutrophils Relative %: 68 %
Platelets: 335 10*3/uL (ref 150–400)
RBC: 3.01 MIL/uL — ABNORMAL LOW (ref 4.22–5.81)
RDW: 17.2 % — ABNORMAL HIGH (ref 11.5–15.5)
WBC: 5.7 10*3/uL (ref 4.0–10.5)
nRBC: 0 % (ref 0.0–0.2)

## 2022-05-01 LAB — BASIC METABOLIC PANEL
Anion gap: 7 (ref 5–15)
BUN: 15 mg/dL (ref 8–23)
CO2: 24 mmol/L (ref 22–32)
Calcium: 8.9 mg/dL (ref 8.9–10.3)
Chloride: 103 mmol/L (ref 98–111)
Creatinine, Ser: 0.58 mg/dL — ABNORMAL LOW (ref 0.61–1.24)
GFR, Estimated: >60 mL/min (ref >60)
Glucose, Bld: 122 mg/dL — ABNORMAL HIGH (ref 70–99)
Potassium: 3.9 mmol/L (ref 3.5–5.1)
Sodium: 134 mmol/L — ABNORMAL LOW (ref 135–145)

## 2022-05-01 LAB — TROPONIN I (HIGH SENSITIVITY): Troponin I (High Sensitivity): 4 ng/L (ref <18)

## 2022-05-01 MED ORDER — DIPHENHYDRAMINE HCL 50 MG/ML IJ SOLN
25.0000 mg | Freq: Once | INTRAMUSCULAR | Status: AC
  Administered 2022-05-01: 25 mg via INTRAVENOUS
  Filled 2022-05-01: qty 1

## 2022-05-01 MED ORDER — PREDNISONE 5 MG/5ML PO SOLN: 30.0000 mg | Freq: Every day | ORAL | 0 refills | Status: AC

## 2022-05-01 MED ORDER — ACETAMINOPHEN 325 MG PO TABS: 650.0000 mg | ORAL_TABLET | Freq: Once | ORAL | Status: DC

## 2022-05-01 MED ORDER — FENTANYL CITRATE PF 50 MCG/ML IJ SOSY
50.0000 ug | PREFILLED_SYRINGE | Freq: Once | INTRAMUSCULAR | Status: AC
  Administered 2022-05-01: 50 ug via INTRAVENOUS
  Filled 2022-05-01: qty 1

## 2022-05-01 MED ORDER — ONDANSETRON HCL 4 MG/2ML IJ SOLN
4.0000 mg | Freq: Once | INTRAMUSCULAR | Status: AC
  Administered 2022-05-01: 4 mg via INTRAVENOUS
  Filled 2022-05-01: qty 2

## 2022-05-01 MED ORDER — METHYLPREDNISOLONE SODIUM SUCC 125 MG IJ SOLR
125.0000 mg | Freq: Once | INTRAMUSCULAR | Status: AC
  Administered 2022-05-01: 125 mg via INTRAVENOUS
  Filled 2022-05-01: qty 2

## 2022-05-01 NOTE — ED Notes (Signed)
Pt ambulated to restroom stand by assist.

## 2022-05-01 NOTE — ED Provider Notes (Signed)
[name]   CSN: [id number] Arrival date & time: 05/01/22  1628     History  Chief Complaint  Patient presents with   Shortness of Breath    [name] is a 71 y.o. male male with current throat cancer presenting to the emergency room with concerns of tongue and throat swelling.  He reports that this pain and shortness of breath.  He states that it started 4 days ago and has continuously gotten worse.  He reports that he started some new medications on Monday after he was discharged from the hospital.  These medications include doxycycline, gabapentin, lansoprazole, metronidazole, Pepto-Bismol.  I spoke to his caretaker, [name] in the phone.  Who states that these are all new medications.  He denies any itching or hives.  He states that his tongue has been worsening over the past couple days.  He denies having a pulse ox at home.  Patient does have a trach.  He denies any chest pain or any new leg edema.  He denies any new food, soaps, or toothpaste.  Patient was in the hospital at [facility], on Sunday being treated for an ulcer that required a transfusion.  He states that he is a little bit more comfortable now and feels that the tongue swelling has been down a little.   Shortness of Breath Associated symptoms: headaches   Associated symptoms: no abdominal pain, no chest pain, no cough and no fever        Home Medications Prior to Admission medications   Medication Sig Start Date End Date Taking? Authorizing Provider  predniSONE 5 MG/5ML solution Take 30 mLs (30 mg total) by mouth daily with breakfast for 4 days. 05/01/22 05/05/22 Yes Rilda Bulls, DO  atropine 1 % ophthalmic solution Place 1 drop into the right eye 2 (two) times a day.     [provider]  clindamycin (CLINDAGEL) 1 % gel Apply topically 2 (two) times daily. 03/11/22   [name], NP  losartan (COZAAR) 50 MG tablet Take 50 mg by mouth daily. 09/19/21    [provider]  magnesium oxide (MAG-OX) 400 (240 Mg) MG tablet Take 1 tablet (400 mg total) by mouth daily. 08/05/21   [name], MD  Nutritional Supplements ([employer] STANDARD 1.4) LIQD 487 mLs by Enteral route 3 (three) times daily. 1.5 cartons (487 ml) [employer] 1.4 TID with 60 ml free water before and 60 ml free water after each TF bolus. Provide an additional 120 ml free water TID. 04/22/21   [name], DO  nystatin (MYCOSTATIN) 100000 UNIT/ML suspension Take 5 mLs (500,000 Units total) by mouth 4 (four) times daily. 08/12/21   Walisiewicz, Kaitlyn E, PA-C  ondansetron (ZOFRAN) 8 MG tablet Take 1 tablet (8 mg total) by mouth 2 (two) times daily as needed for refractory nausea / vomiting. Starting on day 3 after chemo. 12/31/21   [name], MD  oxyCODONE (OXY IR/ROXICODONE) 5 MG immediate release tablet Take 1 tablet (5 mg total) by mouth every 6 (six) hours as needed for severe pain. 04/08/22   [name], MD  pantoprazole (PROTONIX) 40 MG tablet Take 40 mg by mouth 2 (two) times daily. 05/09/21   [provider]  prednisoLONE acetate (PRED FORTE) 1 % ophthalmic suspension Place 1 drop into the right eye 4 (four) times daily.    [provider]  prochlorperazine (COMPAZINE) 10 MG tablet Take 1 tablet (10 mg total) by mouth  every 6 (six) hours as needed (Nausea or vomiting). 02/11/22   [name], MD  Vitamin D, Ergocalciferol, (DRISDOL) 1.25 MG (50000 UNIT) CAPS capsule Take 50,000 Units by mouth every Saturday. 05/22/20   [provider]  Water For Irrigation, Sterile (FREE WATER) SOLN Place 100 mLs into feeding tube every 4 (four) hours. 04/22/21   [name], DO      Allergies    Patient has no known allergies.    Review of Systems   Review of Systems  Constitutional:  Negative for chills, fever and unexpected weight change.  Respiratory:  Positive for shortness of breath. Negative for cough.   Cardiovascular:  Negative for  chest pain and leg swelling.  Gastrointestinal:  Negative for abdominal pain.  Neurological:  Positive for headaches.    Physical Exam Updated Vital Signs BP (!) 141/88   Pulse 75   Temp 98.5 F (36.9 C) (Oral)   Resp 20   Ht '5\' 7"'$  (1.702 m)   Wt 45.4 kg   SpO2 99%   BMI 15.66 kg/m  Physical Exam  General: Alert and orientated x3. Patient is resting comfortably in bed in no acute distress with trach in place. Head: Normocephalic, atraumatic  Mouth: Increased tongue size noted.  Unable to visualize back of throat. Neck: Supple, nontender, full range of motion Cardio: Regular rate and rhythm, no murmurs, rubs or gallops. 2+ pulses to bilateral upper and lower extremities  Pulmonary: Decreased breath sounds throughout Skin: No rashes noted   ED Results / Procedures / Treatments   Labs (all labs ordered are listed, but only abnormal results are displayed) Labs Reviewed  BASIC METABOLIC PANEL - Abnormal; Notable for the following components:      Result Value   Sodium 134 (*)    Glucose, Bld 122 (*)    Creatinine, Ser 0.58 (*)    All other components within normal limits  CBC WITH DIFFERENTIAL/PLATELET - Abnormal; Notable for the following components:   RBC 3.01 (*)    Hemoglobin 9.4 (*)    HCT 29.1 (*)    RDW 17.2 (*)    All other components within normal limits  TROPONIN I (HIGH SENSITIVITY)  TROPONIN I (HIGH SENSITIVITY)    EKG EKG Interpretation  Date/Time:  Thursday May 01 2022 16:57:03 EDT Ventricular Rate:  83 PR Interval:  141 QRS Duration: 83 QT Interval:  386 QTC Calculation: 454 R Axis:   47 Text Interpretation: Sinus rhythm Atrial premature complex Consider left atrial enlargement when compared to prior. similar appearance. No sTEMI Confirmed by Antony Blackbird [phone]) on 05/01/2022 5:32:22 PM  Radiology DG Chest 2 View  Result Date: 05/01/2022 CLINICAL DATA:  Dyspnea EXAM: CHEST - 2 VIEW COMPARISON:  Radiographs 12/31/2021 FINDINGS: Left chest  wall Port-A-Cath tip at the superior cavoatrial junction. Tracheostomy. No focal consolidation, pleural effusion, or pneumothorax. Normal cardiomediastinal silhouette. No acute osseous abnormality. IMPRESSION: No active cardiopulmonary disease. Electronically Signed   By: Placido Sou M.D.   On: 05/01/2022 17:59    Procedures Procedures    Medications Ordered in ED Medications  acetaminophen (TYLENOL) tablet 650 mg (650 mg Oral Not Given 05/01/22 1759)  ondansetron (ZOFRAN) injection 4 mg (4 mg Intravenous Given 05/01/22 1822)  methylPREDNISolone sodium succinate (SOLU-MEDROL) 125 mg/2 mL injection 125 mg (125 mg Intravenous Given 05/01/22 1916)  diphenhydrAMINE (BENADRYL) injection 25 mg (25 mg Intravenous Given 05/01/22 1917)  fentaNYL (SUBLIMAZE) injection 50 mcg (50 mcg Intravenous Given 05/01/22 1914)    ED Course/  Medical Decision Making/ A&P                           Medical Decision Making This is a 71 year old male with a past medical history of active throat cancer who presents to the emergency room with concerns of tongue swelling and throat swelling.  Patient notes that started 4 days ago.  Patient does have trach in place.  Patient states that he has developed shortness of breath due to this swelling.  On exam, patient has stable vital signs with 100% O2 on room air.  Normal blood pressure and normal heart rate noted.  On exam, there is decreased breath sounds throughout.  Patient does endorse that he started new medications, talk to his caretaker, [name], who states that he started treatment for H. pylori.  He started taking doxycycline, gabapentin, lansoprazole, metronidazole, and Pepto-Bismol.  Patient is not on any ACE.  This could be swelling from new medication started.  This could be pneumonia.  Will order chest x-ray. We will also obtain BMP and CBC.  As well as troponin.  Patient does endorse a headache, will give Tylenol.  CBC showing stable hemoglobin of 9.4.  Troponin and  BNP still pending.  Amount and/or Complexity of Data Reviewed Labs: ordered. Radiology: ordered.  Risk OTC drugs. Prescription drug management.   1827: Initial labs showing mildly low sodium at 134.  Creatinine 0.58 which is baseline.  Troponin negative.  Hemoglobin stable at 9.4.  CBC otherwise unremarkable.  Patient did request nausea medicine.  Will provide Zofran.  No prolonged QT noticed on EKG. chest x-ray negative for any pneumonias or any other etiology.  1905: This is likely some allergy to his new meds.Patient will get Solumedrol and benadryl in the department. Patient will also get fentanyl for cancer pain in the department. Patient will be reevaluated.  2119: On reevaluation, patient request to go home.  He states that he is feeling better after receiving steroids.  He also reports feeling better after receiving pain medicine.  Given his stable vital signs and him feeling better, I think he is stable to be discharged.  Overall, I did consider angioedema, but this is less likely given the he had nausea and vomiting as well.  I did look at his chronic medications.  This is likely an allergic reaction to his new medications.  Patient to call as physician who wrote these medications to see if they should switch the medications out.  Patient will be discharged on 30 mg of prednisone liquid form for the next 4 days.  Patient to follow-up with his primary care physician for evaluation.          Final Clinical Impression(s) / ED Diagnoses Final diagnoses:  Allergic reaction, initial encounter    Rx / DC Orders ED Discharge Orders          Ordered    predniSONE 5 MG/5ML solution  Daily with breakfast        05/01/22 2116              Leigh Aurora, DO 05/01/22 2121    Tegeler, Gwenyth Allegra, MD 05/01/22 2133

## 2022-05-01 NOTE — ED Triage Notes (Signed)
Pt BIB EMS for SOB and some bilateral jaw swelling. Pt has hx of throat cancer and has a trach.    122/78 83 100% RA.

## 2022-05-01 NOTE — Discharge Instructions (Signed)
Thank you for allowing me to take part in your care today.  He is what we discussed today.  1.  Given your tongue swelling and throat swelling, this is likely an allergic reaction to your new medications.  Please call your doctor and tell them that you may be allergic to the new medications that they wrote.  Ask them which ones they want you to continue taking versus what is on you should stop taking.  For the time being, do not take any of the new ones until they figure out which ones may have caused your tongue swelling.  2.  You did receive a chest x-ray today given that you are having shortness of breath, this did not show anything.  3.  If you develop worsening shortness of breath or worsening tongue swelling, please return back to the emergency room to be evaluated.  Thank you, Dr. Posey Pronto

## 2022-05-02 ENCOUNTER — Inpatient Hospital Stay: Payer: Medicare HMO | Attending: Hematology and Oncology | Admitting: Hematology and Oncology

## 2022-05-02 ENCOUNTER — Encounter: Payer: Self-pay | Admitting: Hematology and Oncology

## 2022-05-02 VITALS — BP 161/93 | HR 85 | Temp 97.9°F | Resp 16 | Ht 67.0 in | Wt 96.5 lb

## 2022-05-02 DIAGNOSIS — Z87891 Personal history of nicotine dependence: Secondary | ICD-10-CM | POA: Insufficient documentation

## 2022-05-02 DIAGNOSIS — Z931 Gastrostomy status: Secondary | ICD-10-CM

## 2022-05-02 DIAGNOSIS — Z923 Personal history of irradiation: Secondary | ICD-10-CM | POA: Diagnosis not present

## 2022-05-02 DIAGNOSIS — Z9221 Personal history of antineoplastic chemotherapy: Secondary | ICD-10-CM | POA: Insufficient documentation

## 2022-05-02 DIAGNOSIS — R634 Abnormal weight loss: Secondary | ICD-10-CM

## 2022-05-02 DIAGNOSIS — C321 Malignant neoplasm of supraglottis: Secondary | ICD-10-CM

## 2022-05-02 DIAGNOSIS — C770 Secondary and unspecified malignant neoplasm of lymph nodes of head, face and neck: Secondary | ICD-10-CM | POA: Diagnosis not present

## 2022-05-02 DIAGNOSIS — G893 Neoplasm related pain (acute) (chronic): Secondary | ICD-10-CM

## 2022-05-02 MED ORDER — OXYCODONE HCL 5 MG PO TABS
5.0000 mg | ORAL_TABLET | Freq: Four times a day (QID) | ORAL | 0 refills | Status: DC | PRN
Start: 2022-05-06 — End: 2022-06-09

## 2022-05-02 NOTE — Progress Notes (Signed)
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 SEE NOTE   04/01/2019 Surgery   Pre-Op Dx:   right supraglottic carcinoma   Post-op Dx: T2 N0 M0 (stage II) right supraglottic carcinoma   Proc: Direct laryngoscopy with biopsy.  Cervical esophagoscopy.  Bronchoscopy.   Surg:  [name]    Findings: A bulky necrotic and semi-pedunculated tumor coming off the lateral surface of the right aryepiglottic fold and the anterior and lateral aspect of the piriform sinus on the right side.  Airway was compromised by the tumor mass at intubation.   04/11/2019 Imaging   CT neck: IMPRESSION: The glottis is closed with suboptimal evaluation of the vocal cords. No asymmetry of the cords. Correlate with laryngoscopy results.   No enlarged lymph nodes in the neck. 7 mm right level 2 lymph node may be reactive. No definite pathologic lymph nodes in the neck.   04/11/2019 Imaging   CT neck  This CT was performed after surgical debulking of an exophytic laryngeal tumor.   Most apparent on series 2, image 51 there is abnormal asymmetric  hyperenhancement along the surface of the right piriform sinus, and perhaps also involving the bilateral AE folds.   04/22/2019 Imaging   PET: IMPRESSION: 1. Mild residual activity in the posterior RIGHT hypopharynx confined to the mucosa. 2. No evidence of hypermetabolic metastatic lymph nodes in LEFT or RIGHT neck. 3. No evidence distant metastatic disease.   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 New FDG avid right level 2 cervical lymph node concerning for recurrent disease. No findings of distant metastatic disease within the chest, abdomen or pelvis. 2. Mild nonspecific increased uptake in the right side of larynx is similar to previous exam. 3. Aortic Atherosclerosis (ICD10-I70.0). Coronary artery calcifications.    11/21/2019 Pathology Results   SURGICAL PATHOLOGY  CASE: [id number]  PATIENT: Harvin Ayotte  Surgical Pathology Report   FINAL MICROSCOPIC DIAGNOSIS:   A. LYMPH NODE, RIGHT CERVICAL:  - Poorly differentiated carcinoma consistent with metastatic squamous cell carcinoma.  - See comment   01/06/2020 Pathology Results   SURGICAL PATHOLOGY  CASE: [id number]  PATIENT: [employer]  Surgical Pathology Report  Clinical History: metastatic cancer to cervical lymph nodes (cm)   FINAL MICROSCOPIC DIAGNOSIS:   A. LYMPH NODE, RIGHT MODIFIED, DISSECTION:  - Metastatic squamous cell carcinoma in four of nineteen lymph nodes (4/19).  - Largest metastatic nodule is 3.1 cm with 5 mm  extra-nodal extension.  - Lymphovascular space involvement in peri-nodal connective tissue.  - See comment.    01/06/2020 Surgery   PRE-OPERATIVE DIAGNOSIS:  METASTATIC CANCER TO CERVICAL LYMPH NODES   POST-OPERATIVE DIAGNOSIS:  METASTATIC CANCER TO CERVICAL LYMPH NODES   PROCEDURE:  Procedure(s): DIRECT LARYNGOSCOPY MODIFIED RADICAL NECK DISSECTION   SURGEON:  [name], MD     SPECIMEN: Right modified neck dissection including levels 2 and 3, including the internal jugular vein, suture marks the inferior jugular vein stump.   07/05/2020 Imaging   1. Sequelae of radiation and right neck dissection from the previous contrasted Neck CT.   2. Asymmetric soft tissue thickening and enhancement along the right lateral pharynx. Although this might be asymmetric mucositis, the appearance is suspicious and recommend direct inspection. NI-RADS category 2a.   3. Suspicious small 6 mm hyperenhancing nodular soft tissue along the posterior margin of the neck dissection at right level 3. NI-RADS category 2 vs 3 - although this is likely too small for imaging guided biopsy. Repeat PET-CT may be most valuable.   4. Mild inflammatory appearing right upper lobe centrilobular ground-glass opacity, new since February. Consider mild or developing right upper lobe infection. Post radiation changes to the lung apices.       08/13/2020 Pathology Results   A. OROPHARNYX, BIOPSY:  - Poorly differentiated squamous cell carcinoma.  - See comment.    08/13/2020 Surgery   POST-OPERATIVE DIAGNOSIS:  Tonsillary Mass History of Laryngeal  Cancer   PROCEDURE:  Procedure(s): DIRECT LARYNGOSCOPY BIOPSY OF OROPHARYNGEAL MASS   08/30/2020 PET scan   1. Recurrence of head neck carcinoma with a broad hypermetabolic pharyngeal mucosal lesion involving the RIGHT lateral posterior oropharynx and hypopharynx extending from the palatine tonsil to the vallecula. 2. Hypermetabolic lymph node posterior  sternocleidomastoid muscle on the RIGHT (level 3). 3. Hypermetabolic nodule within the LEFT parotid glands favored small primary parotid neoplasm. 4. No evidence thoracic metastasis   09/24/2020 - 10/30/2020 Chemotherapy   He received weekly cisplatin   01/31/2021 PET scan   Findings of worsening nodal disease with a node or group of nodes in the LEFT neck and a RIGHT supraclavicular lymph node as discussed.   Post treatment changes with soft tissue fullness in the area of the prior tumor but with markedly diminished metabolic activity.     03/11/2021 Imaging   Ct neck  Diffuse pharyngeal and laryngeal mucosal and submucosal edema probably related to acute radiation injury. The airway is narrow and could possibly be critically narrowed. I do not see any evidence of recurrent mucosal or submucosal tumor identifiable.   Similar appearance of the recurrent malignant lymphadenopathy on the left at level 2 and level 3 and on the right in the low supraclavicular region just above and behind the clavicular head.   04/12/2021 Imaging   Ct abdomen and pelvis  1. Contrast material in the peritoneal cavity of the upper abdomen suggesting leak along the gastrostomy tract. 2. Gastrostomy tube is appropriately positioned. No evidence of abscess. 3. 1.3 cm right middle lobe nodule, new since 01/31/2021. Favor infectious/inflammatory over primary/secondary neoplasm given the relatively short time frame of development.   04/12/2021 Surgery   Postoperative Diagnosis: Perforated duodenal ulcer (1st  portion)   Surgical Procedure:  Modified Graham patch of duodenal ulcer Gastrostomy tube exchange   Operative Team Members:  Surgeon(s) and Role:    * Stechschulte, Nickola Major, MD - Primary     Drains:  (19 Fr) Jackson-Pratt drain(s) with closed bulb suction in the right upper quadrant near the duodenal ulcer repair and Gastrostomy Tube 26 fr   05/09/2021 Imaging   1. Progression of lesion in the right tonsil  with central fluid collection and peripheral enhancement. This was a site of prior recurrent sick could represent progressive tumor or abscess. Continued follow-up recommended. 2. Progressive malignant lymphadenopathy in the left neck. Stable lymphadenopathy right supraclavicular region. 3. New area of stranding in the superior segment left lower lobe but certain etiology. Attention on follow-up recommended.     06/13/2021 - 11/13/2021 Chemotherapy   Patient is on Treatment Plan :  Paclitaxel + Carboplatin q7d     06/13/2021 - 11/13/2021 Chemotherapy   Patient is on Treatment Plan : HEAD/NECK Pembrolizumab Q21D     07/18/2021 Imaging   Ct chest 1. No highly specific findings identified to suggest pulmonary or nodal metastasis within the chest 2. Multifocal patchy areas of ground-glass and nodular airspace densities. Surrounding postinflammatory changes are noted with thickening of the peribronchovascular interstitium. Imaging findings are concerning for sequelae of aspiration and/or multifocal infection. Follow-up imaging after appropriate antibiotic therapy is recommended with repeat CT of the chest in 3 months to ensure resolution and to rule out the possibility of underlying metastatic nodule. 3. Coronary artery calcifications. 4. Aortic Atherosclerosis (ICD10-I70.0) and Emphysema (ICD10-J43.9).   07/18/2021 Imaging   CT neck  1. Positive treatment response with decreased size of nodal metastases in the left jugular chain. There is likely residual active tumor in the treated nodes. 2. Ulcerated lesions in the bilateral palatine fossa, progressed on the left and suspicious for synchronous tumors.   07/23/2021 Cancer Staging   Staging form: Larynx - Supraglottis, AJCC 8th Edition - Clinical stage from 07/23/2021: Stage IVA (rcT2, cN2b, cM0) - Signed by [name], MD on 07/23/2021 Stage prefix: Recurrence   09/19/2021 Imaging   IMPRESSION: 1. Positive treatment response at the cervical lymph  nodes. No residual nodal disease identified currently. 2. Persisting bilateral palatine tonsil lesion with mild size increase on the right.     09/19/2021 Imaging   IMPRESSION: No new significant lymphadenopathy seen in the mediastinum.   There is interval increase in infiltrates at multiple sites in both lungs as described in the body of the report. There is interval clearing of small patchy infiltrates in right lower lobe. Findings suggest possible waxing and waning multifocal pneumonia.   There are few subcentimeter nodules as described in the body of the report which may be part of pneumonia or neoplastic process. Short-term follow-up CT in 3 months may be considered. Coronary artery calcifications are seen. There is ectasia of ascending thoracic aorta.   01/07/2022 - 02/25/2022 Chemotherapy   Patient is on Treatment Plan : HEAD/NECK Cetuximab q7d      PRIOR THERAPY:  - 2020: Definitive radiation therapy to laryngeal tumor (completed). - Jan 2022- Feb 2022: Concurrent chemoradiation with weekly cisplatin (completed). - 03/05/21 - 05/16/21: Docetaxel '100mg'$ , '75mg'$  q21d x2 cycles (progression, perforated duodenal ulcer). - 06/13/21 - 12/03/21: Weekly carboplatin + paclitaxel x12 weeks + pembrolizumab q21d x6 cycle (progression of disease). -07/18/2021 CT neck chest: Positive treatment response with decreased size of nodal metastases in the left jugular chain. There is likely residual active tumor  in the treated nodes. Ulcerated lesions in the bilateral palatine fossa, progressed on the left and suspicious for synchronous tumors. No specific findings identified to suggest pulmonary or nodal metastasis 09/19/2021 CT neck chest: Positive treatment response at the cervical lymph nodes. No residual nodal disease identified currently.Persisting bilateral palatine tonsil lesion with mild size increase on the right. No new significant lymphadenopathy seen in the mediastinum. 01/07/2022 - 02/25/2022:  Cetuximab q7d 03/03/2022 CT neck chest-progressive bilateral palatine tonsil tumor especially on the left. Irregular groundglass opacity in the lung but no signs of metastatic disease. -He was [facility] at [facility] and [facility] and the recommendation was to consider combination of lenvatinib and Keytruda.  Lenvatinib was suggested at a dose of 10 mg p.o. daily and Keytruda every 21 days.  He appears to have had a GI bleed since this combination and was briefly hospitalized.  He is now sent back to [facility] for ongoing treatment.  INTERVAL HISTORY:  [name] 71 y.o. male returns since his hospitalization.  Since his last visit, he was admitted at [facility] for GI bleed, needed multiple units of blood transfusion.  He was discharged on Sunday and is here for follow-up.  Yesterday evening he had to go to the ER for some tongue swelling and lip swelling.  He was thought to have some reaction to his new medications which are doxycycline, metronidazole and lansoprazole.  Ms Verdene Lennert already called [facility] about this anaphylactic reaction and stopped new medications. Since last visit with [place], he has not started any new medication.  He disqualified for the clinical trials.  Plan was to consider lenvatinib and Keytruda and to repeat imaging after 3 cycles.  He however has not had a chance to start lenvatinib given his GI bleed.  He is now scheduled for an infusion on August 30 at [facility] and was wondering if he can continue his care here.  He continues to report pain in the neck and the need to take oxycodone on a regular basis.  In the past he has been using about 3 a day and needs a prescription refill for 90 tablets every month.  He tells me that pain is not as well controlled with this dose. He otherwise has lost some weight, reports some spitting up and vomiting after feeding hence has not been using much [employer].  He has been only using about 2 cans a day.  He does not report any more blood from the  G-tube.  Rest of the pertinent 10 point ROS reviewed and negative.  Rest of the pertinent 10 point ROS reviewed and negative. Patient Active Problem List   Diagnosis Date Noted   Goals of care, counseling/discussion 10/11/2021   Pneumonitis 09/23/2021   Hypomagnesemia 08/05/2021   Dysphagia 07/24/2021   Neuropathy due to chemotherapeutic drug ([place]) 07/04/2021   Weight loss, unintentional 06/20/2021   Protein-calorie malnutrition, severe 04/17/2021   Perforated gastric ulcer ([place])    Gastrostomy tube dysfunction ([place]) 04/12/2021   Anemia due to antineoplastic chemotherapy 03/26/2021   Cancer related pain 03/26/2021   Laryngeal edema 03/11/2021   Nausea and vomiting 01/24/2021   Shortness of breath 01/10/2021   Mucositis due to antineoplastic therapy 01/10/2021   G tube feedings ([place]) 01/10/2021   Chemotherapy induced diarrhea 10/30/2020   Port-A-Cath in place 10/23/2020   Chronic hepatitis C without hepatic coma ([place]) 06/09/2019   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  MEDICAL HISTORY:  Past Medical History:  Diagnosis Date   Cancer ([place])    Throat cancer 2019   ETOH abuse    Frequent urination    Glaucoma    Hepatitis C virus infection without hepatic coma    dx'ed in 11/2018   History of radiation therapy 05/12/19- 07/06/19   Larynx   Hypertension    Wears denture    upper only; lost lower denture    SURGICAL HISTORY: Past Surgical History:  Procedure Laterality Date   ANKLE SURGERY  2011   right ankle   COLONOSCOPY  02/2019   polyps - Dr Havery Moros   DIRECT LARYNGOSCOPY N/A 04/01/2019   Procedure: DIRECT LARYNGOSCOPY WITH BIOPSY;  Surgeon: Jodi Marble, MD;  Location: [place];  Service: ENT;  Laterality: N/A;   DIRECT LARYNGOSCOPY N/A 01/06/2020   Procedure: DIRECT LARYNGOSCOPY;  Surgeon: Izora Gala, MD;  Location: [place];  Service: ENT;  Laterality: N/A;   DIRECT LARYNGOSCOPY N/A 08/13/2020   Procedure:  DIRECT LARYNGOSCOPY;  Surgeon: Izora Gala, MD;  Location: [place];  Service: ENT;  Laterality: N/A;   ESOPHAGOSCOPY N/A 04/01/2019   Procedure: ESOPHAGOSCOPY;  Surgeon: Jodi Marble, MD;  Location: [place];  Service: ENT;  Laterality: N/A;   EXCISION ORAL TUMOR Right 08/13/2020   Procedure: BIOPSY OF OROPHARYNGEAL MASS;  Surgeon: Izora Gala, MD;  Location: [place];  Service: ENT;  Laterality: Right;   EYE SURGERY Right    IR CM INJ ANY COLONIC TUBE W/FLUORO  12/20/2020   IR CM INJ ANY COLONIC TUBE W/FLUORO  03/20/2021   IR GASTROSTOMY TUBE MOD SED  09/24/2020   IR IMAGING GUIDED PORT INSERTION  09/24/2020   IR RADIOLOGIST EVAL & MGMT  07/02/2021   IR [place] GASTRO/COLONIC TUBE PERCUT W/FLUORO  01/10/2022   KNEE SURGERY     LAPAROTOMY N/A 04/12/2021   Procedure: EXPLORATORY LAPAROTOMY, REPAIR OF DUODENAL ULCER WITH Silvestre Gunner;  Surgeon: Felicie Morn, MD;  Location: [facility];  Service: General;  Laterality: N/A;   LARYNGOSCOPY AND BRONCHOSCOPY N/A 04/01/2019   Procedure: BRONCHOSCOPY;  Surgeon: Jodi Marble, MD;  Location: [place];  Service: ENT;  Laterality: N/A;   RADICAL NECK DISSECTION N/A 01/06/2020   Procedure: RADICAL NECK DISSECTION;  Surgeon: Izora Gala, MD;  Location: [facility];  Service: ENT;  Laterality: N/A;    SOCIAL HISTORY: Social History   Socioeconomic History   Marital status: Single    Spouse name: Not on file   Number of children: 2   Years of education: Not on file   Highest education level: Not on file  Occupational History   Not on file  Tobacco Use   Smoking status: Former    Years: 50.00    Types: Cigarettes    Quit date: 06/01/2020    Years since quitting: 1.9   Smokeless tobacco: Never  Vaping Use   Vaping Use: Never used  Substance and Sexual Activity   Alcohol use: Yes    Alcohol/week: 4.0 standard drinks of alcohol    Types: 4 Cans of beer per week    Comment: 40oz beer daily   Drug use: Yes    Types: Cocaine     Comment: none in 2 yrs   Sexual activity: Yes    Partners: Female  Other Topics Concern   Not on file  Social History Narrative   Patient is divorced with 2 children.   Patient is currently living with his sister.   Patient with a  history of smoking a third of pack of cigarettes daily for 50 years.  Patient currently smoking 2 to 3 cigarettes/day.   Patient has never used smokeless tobacco.   Patient with occasional use of alcohol.   Patient last used cocaine approximately 6 months ago.  Patient denies use of marijuana.   Social Determinants of Health   Financial Resource Strain: Not on file  Food Insecurity: No Food Insecurity (09/21/2020)   Hunger Vital Sign    Worried About Running Out of Food in the Last Year: Never true    Ran Out of Food in the Last Year: Never true  Transportation Needs: Unmet Transportation Needs (03/05/2022)   PRAPARE - Hydrologist (Medical): Not on file    Lack of Transportation (Non-Medical): Yes  Physical Activity: Not on file  Stress: Not on file  Social Connections: Unknown (09/21/2020)   Social Connection and Isolation Panel [NHANES]    Frequency of Communication with Friends and Family: Three times a week    Frequency of Social Gatherings with Friends and Family: More than three times a week    Attends Religious Services: Not on file    Active Member of Clubs or Organizations: Not on file    Attends Archivist Meetings: Not on file    Marital Status: Not on file  Intimate Partner Violence: Not At Risk (04/27/2019)   Humiliation, Afraid, Rape, and Kick questionnaire    Fear of Current or Ex-Partner: No    Emotionally Abused: No    Physically Abused: No    Sexually Abused: No    FAMILY HISTORY: Family History  Problem Relation Age of Onset   Breast cancer Sister    Colon cancer Brother 49       ????   Cancer Brother     Review of Systems  Constitutional:  Positive for fatigue. Negative for appetite  change, chills, fever and unexpected weight change.  HENT:   Positive for trouble swallowing. Negative for hearing loss, lump/mass and sore throat.   Eyes:  Negative for eye problems and icterus.  Respiratory:  Negative for chest tightness, cough and shortness of breath.   Cardiovascular:  Negative for chest pain, leg swelling and palpitations.  Gastrointestinal:  Negative for abdominal distention, abdominal pain, constipation, diarrhea, nausea and vomiting.  Endocrine: Negative for hot flashes.  Genitourinary:  Negative for difficulty urinating.   Musculoskeletal:  Positive for neck pain. Negative for arthralgias.  Skin:  Negative for itching and rash.  Neurological:  Negative for dizziness, extremity weakness, headaches and numbness.  Hematological:  Negative for adenopathy. Does not bruise/bleed easily.  Psychiatric/Behavioral:  Negative for depression. The patient is not nervous/anxious.       PHYSICAL EXAMINATION  ECOG PERFORMANCE STATUS: 1 - Symptomatic but completely ambulatory V/S reviewed.  Blood pressure slightly high today likely because of pain  Physical Exam Constitutional:      General: He is not in acute distress.    Appearance: Normal appearance. He is ill-appearing. He is not toxic-appearing.  HENT:     Head: Normocephalic and atraumatic.  Eyes:     General: No scleral icterus. Cardiovascular:     Rate and Rhythm: Normal rate and regular rhythm.     Pulses: Normal pulses.     Heart sounds: Normal heart sounds.  Pulmonary:     Effort: Pulmonary effort is normal.     Breath sounds: Normal breath sounds. No stridor.  Musculoskeletal:  General: No swelling.     Cervical back: Neck supple.  Lymphadenopathy:     Cervical: Cervical adenopathy (Palpable cervical lymphadenopathy in the left neck is soft on palpation today) present.  Skin:    General: Skin is warm and dry.     Findings: No rash.  Neurological:     General: No focal deficit present.      Mental Status: He is alert.  Psychiatric:        Mood and Affect: Mood normal.        Behavior: Behavior normal.     LABORATORY DATA:  CBC    Component Value Date/Time   WBC 5.7 05/01/2022 1716   RBC 3.01 (L) 05/01/2022 1716   HGB 9.4 (L) 05/01/2022 1716   HGB 10.2 (L) 11/01/2021 1157   HCT 29.1 (L) 05/01/2022 1716   PLT 335 05/01/2022 1716   PLT 222 11/01/2021 1157   MCV 96.7 05/01/2022 1716   MCH 31.2 05/01/2022 1716   MCHC 32.3 05/01/2022 1716   RDW 17.2 (H) 05/01/2022 1716   LYMPHSABS 1.1 05/01/2022 1716   MONOABS 0.5 05/01/2022 1716   EOSABS 0.1 05/01/2022 1716   BASOSABS 0.0 05/01/2022 1716    CMP     Component Value Date/Time   NA 134 (L) 05/01/2022 1716   K 3.9 05/01/2022 1716   CL 103 05/01/2022 1716   CO2 24 05/01/2022 1716   GLUCOSE 122 (H) 05/01/2022 1716   BUN 15 05/01/2022 1716   CREATININE 0.58 (L) 05/01/2022 1716   CREATININE 0.54 (L) 11/01/2021 1157   CREATININE 0.61 (L) 10/26/2019 1523   CALCIUM 8.9 05/01/2022 1716   PROT 7.7 03/11/2022 1101   ALBUMIN 4.1 03/11/2022 1101   AST 13 (L) 03/11/2022 1101   AST 15 11/01/2021 1157   ALT 9 03/11/2022 1101   ALT 9 11/01/2021 1157   ALT 53 (H) 09/06/2019 1543   ALKPHOS 49 03/11/2022 1101   BILITOT 0.4 03/11/2022 1101   BILITOT 0.4 11/01/2021 1157   GFRNONAA >60 05/01/2022 1716   GFRNONAA >60 11/01/2021 1157   GFRAA >60 01/03/2020 1355   GFRAA >60 05/04/2019 1207    ASSESSMENT and THERAPY PLAN:  OMARIO ANDER is a 71 y.o. male who returns for a follow up for squamous cell carcinoma of the right supraglottis.  #SCC of right supraglottis:  Status post multiple lines of treatment as mentioned in the oncological history.  His last imaging back from June showed progressive bilateral palatine tonsil tumor especially on the left.  We have hence sent him back to week for consideration of clinical trials.  Apparently he did not qualify for any of the trials.  He was then referred to [facility] where they  recommended considering combination of lenvatinib with Keytruda.  But before he had a chance to start these medications, he was admitted with GI bleed required blood transfusion and was just discharged.  He went back to the ER with some tongue swelling and lip swelling concerning for anaphylactic reaction and hence stopped the medications doxycycline, lansoprazole and metronidazole which were his new medications. He already is scheduled for infusion on August 30 for Keytruda, planning to pick up Lenvima from [place] when he goes there for the infusion.  He also has a follow-up with Dr. Lollie Sails on the same day. Physical examination today with progressive lymphadenopathy concerning for disease progression likely from interruption in treatment.  We will be happy to coordinate his treatment locally if needed but I have advised  him to start on a 30-day since it is already scheduled and authorized.  He and his granddaughter Verdene Lennert expressed understanding. We once again discussed about mechanism of action of Lenvima and adverse effects with that medication including but limited to fatigue, nausea, vomiting, cytopenias increased blood pressure, bleeding and clotting complications,    He did not have any adverse effects with Keytruda in the past.  #Cancer related pain: -- He states the pain is not as well controlled with oxycodone 5 3 times a day.  I have recommended considering palliative care referral he was not keen on this today.  He will continue current medication as prescribed.  Last refill was on July 25.  #Nutrition/Weight loss: --Currently giving 2 cans of [facility] via G-tube. Once again recommended small meals multiple times and careful monitoring of weight.  Patient expressed understanding and satisfaction with the plan provided.   I have spent a total of 40 minutes minutes of face-to-face and non-face-to-face time, preparing to see the patient, performing a medically appropriate  examination, counseling and educating the patient, ordering medications/tests,documenting clinical information in the electronic health record, and care coordination.   [name] MD

## 2022-05-08 ENCOUNTER — Encounter: Payer: Self-pay | Admitting: Gastroenterology

## 2022-05-13 ENCOUNTER — Other Ambulatory Visit: Payer: Self-pay

## 2022-05-13 ENCOUNTER — Encounter (HOSPITAL_COMMUNITY): Payer: Self-pay

## 2022-05-13 ENCOUNTER — Emergency Department (HOSPITAL_COMMUNITY)
Admission: EM | Admit: 2022-05-13 | Discharge: 2022-05-13 | Disposition: A | Discharge status: Home / Self Care | Payer: Medicare HMO | Source: Home / Self Care | Attending: Emergency Medicine | Admitting: Emergency Medicine

## 2022-05-13 ENCOUNTER — Emergency Department (HOSPITAL_COMMUNITY): Payer: Medicare HMO

## 2022-05-13 DIAGNOSIS — Z923 Personal history of irradiation: Secondary | ICD-10-CM | POA: Diagnosis not present

## 2022-05-13 DIAGNOSIS — Y732 Prosthetic and other implants, materials and accessory gastroenterology and urology devices associated with adverse incidents: Secondary | ICD-10-CM | POA: Insufficient documentation

## 2022-05-13 DIAGNOSIS — Z4682 Encounter for fitting and adjustment of non-vascular catheter: Secondary | ICD-10-CM | POA: Diagnosis not present

## 2022-05-13 DIAGNOSIS — C321 Malignant neoplasm of supraglottis: Secondary | ICD-10-CM | POA: Diagnosis not present

## 2022-05-13 DIAGNOSIS — K9423 Gastrostomy malfunction: Secondary | ICD-10-CM | POA: Diagnosis not present

## 2022-05-13 DIAGNOSIS — Z9221 Personal history of antineoplastic chemotherapy: Secondary | ICD-10-CM | POA: Diagnosis not present

## 2022-05-13 DIAGNOSIS — D649 Anemia, unspecified: Secondary | ICD-10-CM | POA: Diagnosis not present

## 2022-05-13 DIAGNOSIS — D62 Acute posthemorrhagic anemia: Secondary | ICD-10-CM | POA: Diagnosis present

## 2022-05-13 DIAGNOSIS — F1010 Alcohol abuse, uncomplicated: Secondary | ICD-10-CM | POA: Diagnosis present

## 2022-05-13 DIAGNOSIS — C770 Secondary and unspecified malignant neoplasm of lymph nodes of head, face and neck: Secondary | ICD-10-CM | POA: Diagnosis not present

## 2022-05-13 DIAGNOSIS — K922 Gastrointestinal hemorrhage, unspecified: Secondary | ICD-10-CM | POA: Diagnosis present

## 2022-05-13 DIAGNOSIS — I4891 Unspecified atrial fibrillation: Secondary | ICD-10-CM | POA: Diagnosis not present

## 2022-05-13 DIAGNOSIS — Z8521 Personal history of malignant neoplasm of larynx: Secondary | ICD-10-CM | POA: Diagnosis not present

## 2022-05-13 DIAGNOSIS — I878 Other specified disorders of veins: Secondary | ICD-10-CM | POA: Diagnosis not present

## 2022-05-13 DIAGNOSIS — Z930 Tracheostomy status: Secondary | ICD-10-CM | POA: Diagnosis not present

## 2022-05-13 DIAGNOSIS — Z800 Family history of malignant neoplasm of digestive organs: Secondary | ICD-10-CM | POA: Diagnosis not present

## 2022-05-13 DIAGNOSIS — I10 Essential (primary) hypertension: Secondary | ICD-10-CM | POA: Diagnosis present

## 2022-05-13 DIAGNOSIS — T85518A Breakdown (mechanical) of other gastrointestinal prosthetic devices, implants and grafts, initial encounter: Secondary | ICD-10-CM | POA: Insufficient documentation

## 2022-05-13 DIAGNOSIS — E876 Hypokalemia: Secondary | ICD-10-CM | POA: Diagnosis present

## 2022-05-13 DIAGNOSIS — Z8619 Personal history of other infectious and parasitic diseases: Secondary | ICD-10-CM | POA: Diagnosis not present

## 2022-05-13 DIAGNOSIS — C760 Malignant neoplasm of head, face and neck: Secondary | ICD-10-CM | POA: Diagnosis not present

## 2022-05-13 DIAGNOSIS — Z85819 Personal history of malignant neoplasm of unspecified site of lip, oral cavity, and pharynx: Secondary | ICD-10-CM | POA: Diagnosis not present

## 2022-05-13 DIAGNOSIS — G893 Neoplasm related pain (acute) (chronic): Secondary | ICD-10-CM | POA: Diagnosis not present

## 2022-05-13 DIAGNOSIS — Z803 Family history of malignant neoplasm of breast: Secondary | ICD-10-CM | POA: Diagnosis not present

## 2022-05-13 DIAGNOSIS — C109 Malignant neoplasm of oropharynx, unspecified: Secondary | ICD-10-CM | POA: Diagnosis not present

## 2022-05-13 DIAGNOSIS — B9681 Helicobacter pylori [H. pylori] as the cause of diseases classified elsewhere: Secondary | ICD-10-CM | POA: Diagnosis present

## 2022-05-13 DIAGNOSIS — K264 Chronic or unspecified duodenal ulcer with hemorrhage: Secondary | ICD-10-CM | POA: Diagnosis present

## 2022-05-13 DIAGNOSIS — K828 Other specified diseases of gallbladder: Secondary | ICD-10-CM | POA: Diagnosis not present

## 2022-05-13 DIAGNOSIS — Z931 Gastrostomy status: Secondary | ICD-10-CM | POA: Diagnosis not present

## 2022-05-13 DIAGNOSIS — B182 Chronic viral hepatitis C: Secondary | ICD-10-CM | POA: Diagnosis present

## 2022-05-13 DIAGNOSIS — Z87891 Personal history of nicotine dependence: Secondary | ICD-10-CM | POA: Diagnosis not present

## 2022-05-13 DIAGNOSIS — N400 Enlarged prostate without lower urinary tract symptoms: Secondary | ICD-10-CM | POA: Diagnosis not present

## 2022-05-13 MED ORDER — IOHEXOL 300 MG/ML  SOLN
30.0000 mL | Freq: Once | INTRAMUSCULAR | Status: AC | PRN
  Administered 2022-05-13: 30 mL

## 2022-05-13 NOTE — ED Notes (Signed)
Pt left prior to discharge paperwork.

## 2022-05-13 NOTE — Discharge Instructions (Signed)
Your PEG tube was replaced today.  No signs of infection surrounding it.  Return to the emergency room if you have any worsening symptoms.

## 2022-05-13 NOTE — ED Triage Notes (Signed)
Pt reports noticing that the end piece of his feeding tube came off today. Tube is still in place just missing the end cap.

## 2022-05-13 NOTE — ED Provider Notes (Signed)
[facility] DEPT Provider Note   CSN: [id number] Arrival date & time: 05/13/22  1215     History  Chief Complaint  Patient presents with   Feeding Tube Problem    Unique E Tursi is a 71 y.o. male.  Mr. Gough is a 71 year old male who presents today for evaluation of feeding tube malfunction.  He states 2 days ago he broke the end.  He presents with his granddaughter who is at bedside.  Denies abdominal pain, fever, chills, chest pain, shortness of breath.  Currently has a 81 [place] feeding tube in place.  The history is provided by the patient and a relative. No language interpreter was used.       Home Medications Prior to Admission medications   Medication Sig Start Date End Date Taking? Authorizing Provider  atropine 1 % ophthalmic solution Place 1 drop into the right eye 2 (two) times a day.     [provider]  clindamycin (CLINDAGEL) 1 % gel Apply topically 2 (two) times daily. 03/11/22   [name], NP  losartan (COZAAR) 50 MG tablet Take 50 mg by mouth daily. 09/19/21   [provider]  magnesium oxide (MAG-OX) 400 (240 Mg) MG tablet Take 1 tablet (400 mg total) by mouth daily. 08/05/21   [name], MD  Nutritional Supplements (KATE [employer]MS STANDARD 1.4) LIQD 487 mLs by Enteral route 3 (three) times daily. 1.5 cartons (487 ml) [employer] 1.4 TID with 60 ml free water before and 60 ml free water after each TF bolus. Provide an additional 120 ml free water TID. 04/22/21   [name], DO  nystatin (MYCOSTATIN) 100000 UNIT/ML suspension Take 5 mLs (500,000 Units total) by mouth 4 (four) times daily. 08/12/21   Walisiewicz, Kaitlyn E, PA-C  ondansetron (ZOFRAN) 8 MG tablet Take 1 tablet (8 mg total) by mouth 2 (two) times daily as needed for refractory nausea / vomiting. Starting on day 3 after chemo. 12/31/21   [name], MD  oxyCODONE (OXY IR/ROXICODONE) 5 MG immediate release tablet Take 1 tablet (5 mg total) by  mouth every 6 (six) hours as needed for severe pain. 05/06/22   [name], MD  pantoprazole (PROTONIX) 40 MG tablet Take 40 mg by mouth 2 (two) times daily. 05/09/21   [provider]  prednisoLONE acetate (PRED FORTE) 1 % ophthalmic suspension Place 1 drop into the right eye 4 (four) times daily.    [provider]  prochlorperazine (COMPAZINE) 10 MG tablet Take 1 tablet (10 mg total) by mouth every 6 (six) hours as needed (Nausea or vomiting). 02/11/22   [name], MD  Vitamin D, Ergocalciferol, (DRISDOL) 1.25 MG (50000 UNIT) CAPS capsule Take 50,000 Units by mouth every Saturday. 05/22/20   [provider]  Water For Irrigation, Sterile (FREE WATER) SOLN Place 100 mLs into feeding tube every 4 (four) hours. 04/22/21   [name], DO      Allergies    Patient has no known allergies.    Review of Systems   Review of Systems  Constitutional:  Negative for chills and fever.  Respiratory:  Negative for shortness of breath.   Cardiovascular:  Negative for chest pain.  Gastrointestinal:  Negative for abdominal pain.  All other systems reviewed and are negative.   Physical Exam Updated Vital Signs BP 108/67   Pulse 84   Temp 97.9 F (36.6 C) (Oral)   Resp 18   SpO2 98%  Physical Exam Vitals  and nursing note reviewed.  Constitutional:      General: He is not in acute distress.    Appearance: Normal appearance. He is not ill-appearing.  HENT:     Head: Normocephalic and atraumatic.     Nose: Nose normal.  Eyes:     Conjunctiva/sclera: Conjunctivae normal.  Pulmonary:     Effort: Pulmonary effort is normal. No respiratory distress.  Abdominal:     General: There is no distension.     Palpations: Abdomen is soft.     Tenderness: There is no abdominal tenderness. There is no guarding.     Comments: Gastrostomy site without signs of infection.  Musculoskeletal:        General: No deformity.  Skin:    Findings: No rash.  Neurological:      Mental Status: He is alert.     ED Results / Procedures / Treatments   Labs (all labs ordered are listed, but only abnormal results are displayed) Labs Reviewed - No data to display  EKG None  Radiology No results found.  Procedures Procedures    Medications Ordered in ED Medications - No data to display  ED Course/ Medical Decision Making/ A&P                           Medical Decision Making Amount and/or Complexity of Data Reviewed Radiology: ordered.  Risk Prescription drug management.   71 year old male presents today for malfunctioning PEG tube.  PEG tube was replaced today.  No surrounding signs of infection.  He denies abdominal pain, fever, chills, shortness of breath, chest pain.  Patient previously had 20 French PEG tube.  PEG tube was replaced with a new 20 French PEG tube.  X-ray was obtained with Gastrografin and confirmed appropriate placement.  Patient discharged in stable condition.  Return precautions discussed.   Final Clinical Impression(s) / ED Diagnoses Final diagnoses:  PEG tube malfunction [facility])    Rx / DC Orders ED Discharge Orders     None         Evlyn Courier, PA-C 05/13/22 1516    Blanchie Dessert, MD 05/13/22 1546

## 2022-05-14 ENCOUNTER — Emergency Department (HOSPITAL_COMMUNITY): Payer: Medicare HMO

## 2022-05-14 ENCOUNTER — Inpatient Hospital Stay (HOSPITAL_COMMUNITY)
Admission: EM | Admit: 2022-05-14 | Discharge: 2022-05-19 | DRG: 378 | Disposition: A | Discharge status: Home / Self Care | Payer: Medicare HMO | Attending: Family Medicine | Admitting: Family Medicine

## 2022-05-14 ENCOUNTER — Other Ambulatory Visit: Payer: Self-pay

## 2022-05-14 ENCOUNTER — Encounter (HOSPITAL_COMMUNITY): Payer: Self-pay

## 2022-05-14 DIAGNOSIS — E876 Hypokalemia: Secondary | ICD-10-CM | POA: Diagnosis present

## 2022-05-14 DIAGNOSIS — K264 Chronic or unspecified duodenal ulcer with hemorrhage: Secondary | ICD-10-CM | POA: Diagnosis not present

## 2022-05-14 DIAGNOSIS — Z923 Personal history of irradiation: Secondary | ICD-10-CM

## 2022-05-14 DIAGNOSIS — K922 Gastrointestinal hemorrhage, unspecified: Secondary | ICD-10-CM | POA: Diagnosis present

## 2022-05-14 DIAGNOSIS — I4891 Unspecified atrial fibrillation: Secondary | ICD-10-CM | POA: Diagnosis not present

## 2022-05-14 DIAGNOSIS — Z9221 Personal history of antineoplastic chemotherapy: Secondary | ICD-10-CM | POA: Diagnosis not present

## 2022-05-14 DIAGNOSIS — B182 Chronic viral hepatitis C: Secondary | ICD-10-CM | POA: Diagnosis not present

## 2022-05-14 DIAGNOSIS — I10 Essential (primary) hypertension: Secondary | ICD-10-CM | POA: Diagnosis present

## 2022-05-14 DIAGNOSIS — D62 Acute posthemorrhagic anemia: Secondary | ICD-10-CM | POA: Diagnosis present

## 2022-05-14 DIAGNOSIS — Z8619 Personal history of other infectious and parasitic diseases: Secondary | ICD-10-CM | POA: Diagnosis not present

## 2022-05-14 DIAGNOSIS — I878 Other specified disorders of veins: Secondary | ICD-10-CM | POA: Diagnosis not present

## 2022-05-14 DIAGNOSIS — D649 Anemia, unspecified: Secondary | ICD-10-CM

## 2022-05-14 DIAGNOSIS — B9681 Helicobacter pylori [H. pylori] as the cause of diseases classified elsewhere: Secondary | ICD-10-CM | POA: Diagnosis present

## 2022-05-14 DIAGNOSIS — F1010 Alcohol abuse, uncomplicated: Secondary | ICD-10-CM | POA: Diagnosis present

## 2022-05-14 DIAGNOSIS — Z8521 Personal history of malignant neoplasm of larynx: Secondary | ICD-10-CM

## 2022-05-14 DIAGNOSIS — K828 Other specified diseases of gallbladder: Secondary | ICD-10-CM | POA: Diagnosis not present

## 2022-05-14 DIAGNOSIS — Z87891 Personal history of nicotine dependence: Secondary | ICD-10-CM

## 2022-05-14 DIAGNOSIS — Z931 Gastrostomy status: Secondary | ICD-10-CM | POA: Diagnosis not present

## 2022-05-14 DIAGNOSIS — Z85819 Personal history of malignant neoplasm of unspecified site of lip, oral cavity, and pharynx: Secondary | ICD-10-CM | POA: Diagnosis not present

## 2022-05-14 DIAGNOSIS — Z803 Family history of malignant neoplasm of breast: Secondary | ICD-10-CM

## 2022-05-14 DIAGNOSIS — C321 Malignant neoplasm of supraglottis: Secondary | ICD-10-CM | POA: Diagnosis present

## 2022-05-14 DIAGNOSIS — Z4682 Encounter for fitting and adjustment of non-vascular catheter: Secondary | ICD-10-CM | POA: Diagnosis not present

## 2022-05-14 DIAGNOSIS — N400 Enlarged prostate without lower urinary tract symptoms: Secondary | ICD-10-CM | POA: Diagnosis not present

## 2022-05-14 DIAGNOSIS — Z789 Other specified health status: Secondary | ICD-10-CM | POA: Diagnosis present

## 2022-05-14 DIAGNOSIS — Z800 Family history of malignant neoplasm of digestive organs: Secondary | ICD-10-CM | POA: Diagnosis not present

## 2022-05-14 DIAGNOSIS — C760 Malignant neoplasm of head, face and neck: Secondary | ICD-10-CM | POA: Diagnosis not present

## 2022-05-14 DIAGNOSIS — C770 Secondary and unspecified malignant neoplasm of lymph nodes of head, face and neck: Secondary | ICD-10-CM | POA: Diagnosis not present

## 2022-05-14 DIAGNOSIS — Z930 Tracheostomy status: Secondary | ICD-10-CM | POA: Diagnosis not present

## 2022-05-14 DIAGNOSIS — G893 Neoplasm related pain (acute) (chronic): Secondary | ICD-10-CM | POA: Diagnosis not present

## 2022-05-14 DIAGNOSIS — C109 Malignant neoplasm of oropharynx, unspecified: Secondary | ICD-10-CM | POA: Diagnosis not present

## 2022-05-14 LAB — COMPREHENSIVE METABOLIC PANEL
ALT: 10 U/L (ref 0–44)
AST: 13 U/L — ABNORMAL LOW (ref 15–41)
Albumin: 3.4 g/dL — ABNORMAL LOW (ref 3.5–5.0)
Alkaline Phosphatase: 27 U/L — ABNORMAL LOW (ref 38–126)
Anion gap: 9 (ref 5–15)
BUN: 15 mg/dL (ref 8–23)
CO2: 24 mmol/L (ref 22–32)
Calcium: 8.9 mg/dL (ref 8.9–10.3)
Chloride: 100 mmol/L (ref 98–111)
Creatinine, Ser: 0.52 mg/dL — ABNORMAL LOW (ref 0.61–1.24)
GFR, Estimated: >60 mL/min (ref >60)
Glucose, Bld: 118 mg/dL — ABNORMAL HIGH (ref 70–99)
Potassium: 3.6 mmol/L (ref 3.5–5.1)
Sodium: 133 mmol/L — ABNORMAL LOW (ref 135–145)
Total Bilirubin: 0.3 mg/dL (ref 0.3–1.2)
Total Protein: 6.3 g/dL — ABNORMAL LOW (ref 6.5–8.1)

## 2022-05-14 LAB — CBC WITH DIFFERENTIAL/PLATELET
Abs Immature Granulocytes: 0.08 10*3/uL — ABNORMAL HIGH (ref 0.00–0.07)
Basophils Absolute: 0.1 10*3/uL (ref 0.0–0.1)
Basophils Relative: 1 %
Eosinophils Absolute: 0 10*3/uL (ref 0.0–0.5)
Eosinophils Relative: 1 %
HCT: 17.6 % — ABNORMAL LOW (ref 39.0–52.0)
Hemoglobin: 5.7 g/dL — CL (ref 13.0–17.0)
Immature Granulocytes: 1 %
Lymphocytes Relative: 12 %
Lymphs Abs: 0.9 10*3/uL (ref 0.7–4.0)
MCH: 32 pg (ref 26.0–34.0)
MCHC: 32.4 g/dL (ref 30.0–36.0)
MCV: 98.9 fL (ref 80.0–100.0)
Monocytes Absolute: 0.4 10*3/uL (ref 0.1–1.0)
Monocytes Relative: 6 %
Neutro Abs: 5.6 10*3/uL (ref 1.7–7.7)
Neutrophils Relative %: 79 %
Platelets: 263 10*3/uL (ref 150–400)
RBC: 1.78 MIL/uL — ABNORMAL LOW (ref 4.22–5.81)
RDW: 18 % — ABNORMAL HIGH (ref 11.5–15.5)
WBC: 7 10*3/uL (ref 4.0–10.5)
nRBC: 0 % (ref 0.0–0.2)

## 2022-05-14 LAB — POC OCCULT BLOOD, ED: Fecal Occult Bld: POSITIVE — AB

## 2022-05-14 LAB — PROTIME-INR
INR: 1.1 (ref 0.8–1.2)
Prothrombin Time: 13.6 seconds (ref 11.4–15.2)

## 2022-05-14 LAB — PREPARE RBC (CROSSMATCH)

## 2022-05-14 LAB — APTT: aPTT: 42 seconds — ABNORMAL HIGH (ref 24–36)

## 2022-05-14 MED ORDER — SODIUM CHLORIDE 0.9% FLUSH
3.0000 mL | Freq: Two times a day (BID) | INTRAVENOUS | Status: DC
  Administered 2022-05-15 – 2022-05-19 (×9): 3 mL via INTRAVENOUS

## 2022-05-14 MED ORDER — FENTANYL CITRATE PF 50 MCG/ML IJ SOSY
50.0000 ug | PREFILLED_SYRINGE | Freq: Once | INTRAMUSCULAR | Status: AC
  Administered 2022-05-14: 50 ug via INTRAVENOUS
  Filled 2022-05-14: qty 1

## 2022-05-14 MED ORDER — LACTATED RINGERS IV SOLN
INTRAVENOUS | Status: AC
Start: 2022-05-14 — End: 2022-05-15

## 2022-05-14 MED ORDER — DIATRIZOATE MEGLUMINE & SODIUM 66-10 % PO SOLN
ORAL | Status: AC
  Administered 2022-05-14: 30 mL
  Filled 2022-05-14: qty 30

## 2022-05-14 MED ORDER — HYDRALAZINE HCL 20 MG/ML IJ SOLN
10.0000 mg | INTRAMUSCULAR | Status: DC | PRN
  Administered 2022-05-14 – 2022-05-15 (×2): 10 mg via INTRAVENOUS
  Filled 2022-05-14 (×2): qty 1

## 2022-05-14 MED ORDER — IOHEXOL 300 MG/ML  SOLN
100.0000 mL | Freq: Once | INTRAMUSCULAR | Status: AC | PRN
  Administered 2022-05-14: 100 mL via INTRAVENOUS

## 2022-05-14 MED ORDER — PANTOPRAZOLE SODIUM 40 MG IV SOLR
40.0000 mg | Freq: Two times a day (BID) | INTRAVENOUS | Status: DC
Start: 2022-05-14 — End: 2022-05-19
  Administered 2022-05-14 – 2022-05-19 (×10): 40 mg via INTRAVENOUS
  Filled 2022-05-14 (×10): qty 10

## 2022-05-14 MED ORDER — SODIUM CHLORIDE 0.9% IV SOLUTION
Freq: Once | INTRAVENOUS | Status: AC
Start: 2022-05-14 — End: 2022-05-14

## 2022-05-14 MED ORDER — ONDANSETRON HCL 4 MG/2ML IJ SOLN
4.0000 mg | Freq: Four times a day (QID) | INTRAMUSCULAR | Status: DC | PRN
  Administered 2022-05-15 – 2022-05-16 (×2): 4 mg via INTRAVENOUS
  Filled 2022-05-14 (×2): qty 2

## 2022-05-14 MED ORDER — ACETAMINOPHEN 650 MG RE SUPP
650.0000 mg | Freq: Four times a day (QID) | RECTAL | Status: DC | PRN
  Administered 2022-05-15: 650 mg via RECTAL
  Filled 2022-05-14: qty 1

## 2022-05-14 MED ORDER — MORPHINE SULFATE (PF) 2 MG/ML IV SOLN
1.0000 mg | INTRAVENOUS | Status: DC | PRN
  Administered 2022-05-14 – 2022-05-15 (×3): 1 mg via INTRAVENOUS
  Filled 2022-05-14 (×4): qty 1

## 2022-05-14 NOTE — Hospital Course (Signed)
[name] is a 71 y.o. male with medical history significant for metastatic SCC of the supraglottic larynx (s/p primary XRT 07/06/2019, right neck dissection April 2021, then docetaxel 03/05/21-05/16/21 stopped after bowel perforation, carbo/taxol/pembro received until March 2023, cetuximab 01/07/22-02/25/22 with progression), s/p tracheostomy and PEG tube, anemia due to iron deficiency and chronic blood loss, duodenal ulcer, HTN, EtOH use who is admitted with acute on chronic anemia presumed secondary to upper GI bleeding.

## 2022-05-14 NOTE — Assessment & Plan Note (Addendum)
Duodenal ulcer Iron deficiency Hemoglobin 5.7 on admission with positive FOBT and melena.  Recent admit at [facility] with nonbleeding duodenal ulcer and acute on chronic duodenitis found on endoscopy with biopsy. -[facility] to see in consultation -Continue IV Protonix 40 mg keep BID -Keep n.p.o., nothing by PEG tube

## 2022-05-14 NOTE — ED Provider Notes (Signed)
[place] EMERGENCY DEPARTMENT Provider Note   CSN: [id number] Arrival date & time: 05/14/22  1448     History  Chief Complaint  Patient presents with   low hemoglobin    Needs transfusion    [name] is a 71 y.o. male.  HPI   71 year old male presents emergency department with complaints of low hemoglobin.  He states he was seen earlier this morning at [facility] for feeding tube malfunction and they drew blood work which showed him anemic with hemoglobin of 6.  Patient has a history of anemia secondary to antineoplastic chemotherapy.  He denies any known blood loss such as stool, hematochezia, melena.  He states he has been having darker discharge from around his PEG tube site since it was replaced yesterday; but he states he has had no difficulty accessing PEG tube.  Patient is concerned about potential bleeding secondary to PEG tube placement yesterday into his abdomen given his current abdominal pain.  Denies fever, chills, night sweats, chest pain, shortness of breath, nausea, vomiting, urinary symptoms, change in bowel habits.  Patient denies any blood thinner use.  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 Authorizing Provider  atropine 1 % ophthalmic solution Place 1 drop into the right eye 2 (two) times a day.     [provider]  clindamycin (CLINDAGEL) 1 % gel Apply topically 2 (two) times daily. 03/11/22   [name], NP  losartan (COZAAR) 50 MG tablet Take 50 mg by mouth daily. 09/19/21   [provider]  magnesium oxide (MAG-OX) 400 (240 Mg) MG tablet Take 1 tablet (400 mg total) by mouth daily. 08/05/21   [name], MD  Nutritional Supplements (KATE [employer]ARMS STANDARD 1.4) LIQD 487 mLs by Enteral  route 3 (three) times daily. 1.5 cartons (487 ml) [employer] 1.4 TID with 60 ml free water before and 60 ml free water after each TF bolus. Provide an additional 120 ml free water TID. 04/22/21   [name], DO  nystatin (MYCOSTATIN) 100000 UNIT/ML suspension Take 5 mLs (500,000 Units total) by mouth 4 (four) times daily. 08/12/21   Walisiewicz, Kaitlyn E, PA-C  ondansetron (ZOFRAN) 8 MG tablet Take 1 tablet (8 mg total) by mouth 2 (two) times daily as needed for refractory nausea / vomiting. Starting on day 3 after chemo. 12/31/21   [name], MD  oxyCODONE (OXY IR/ROXICODONE) 5 MG immediate release tablet Take 1 tablet (5 mg total) by mouth every 6 (six) hours as needed for severe pain. 05/06/22   [name], MD  pantoprazole (PROTONIX) 40 MG tablet Take 40 mg by mouth 2 (two) times daily. 05/09/21   [provider]  prednisoLONE acetate (PRED FORTE) 1 % ophthalmic suspension Place 1 drop into the right eye 4 (four) times daily.    [provider]  prochlorperazine (COMPAZINE) 10 MG tablet Take 1 tablet (10 mg total) by mouth every 6 (six) hours as needed (Nausea or vomiting). 02/11/22   [name], MD  Vitamin D, Ergocalciferol, (DRISDOL) 1.25 MG (50000 UNIT) CAPS capsule Take 50,000 Units by mouth every Saturday. 05/22/20   [provider]  Water For Irrigation, Sterile (FREE WATER) SOLN Place 100 mLs into feeding tube every 4 (four) hours. 04/22/21   [name], DO  Allergies    Patient has no known allergies.    Review of Systems   Review of Systems  All other systems reviewed and are negative.   Physical Exam Updated Vital Signs BP (!) 170/96 Comment: cuff elevated above heart level  Pulse 87   Temp 98.2 F (36.8 C) (Oral)   Resp (!) 22   SpO2 98%  Physical Exam Vitals and nursing note reviewed.  Constitutional:      General: He is not in acute distress.    Appearance: He is well-developed.  HENT:     Head: Normocephalic and  atraumatic.  Eyes:     Conjunctiva/sclera: Conjunctivae normal.  Cardiovascular:     Rate and Rhythm: Normal rate and regular rhythm.     Heart sounds: No murmur heard. Pulmonary:     Effort: Pulmonary effort is normal. No respiratory distress.     Breath sounds: Normal breath sounds.  Abdominal:     Palpations: Abdomen is soft.     Tenderness: There is abdominal tenderness. There is no right CVA tenderness or left CVA tenderness.     Comments: Diffuse abdominal tenderness on exam with no point of focality.  PEG tube in place with no surrounding erythematic skin changes.  Genitourinary:    Rectum: No mass, tenderness, anal fissure, external hemorrhoid or internal hemorrhoid. Normal anal tone.     Comments: No active bleeding noted on exam.  Stool sample was obtained on digital rectal exam which showed no bright red blood.  Stool was dark in appearance no melena was not ruled out..  Sample was obtained and sent via Hemoccult for analysis of presence of blood. Musculoskeletal:        General: No swelling.     Cervical back: Neck supple. No rigidity or tenderness.     Right lower leg: No edema.     Left lower leg: No edema.  Skin:    General: Skin is warm and dry.     Capillary Refill: Capillary refill takes less than 2 seconds.  Neurological:     Mental Status: He is alert.  Psychiatric:        Mood and Affect: Mood normal.     ED Results / Procedures / Treatments   Labs (all labs ordered are listed, but only abnormal results are displayed) Labs Reviewed  CBC WITH DIFFERENTIAL/PLATELET - Abnormal; Notable for the following components:      Result Value   RBC 1.78 (*)    Hemoglobin 5.7 (*)    HCT 17.6 (*)    RDW 18.0 (*)    Abs Immature Granulocytes 0.08 (*)    All other components within normal limits  COMPREHENSIVE METABOLIC PANEL - Abnormal; Notable for the following components:   Sodium 133 (*)    Glucose, Bld 118 (*)    Creatinine, Ser 0.52 (*)    Total Protein 6.3  (*)    Albumin 3.4 (*)    AST 13 (*)    Alkaline Phosphatase 27 (*)    All other components within normal limits  APTT - Abnormal; Notable for the following components:   aPTT 42 (*)    All other components within normal limits  POC OCCULT BLOOD, ED - Abnormal; Notable for the following components:   Fecal Occult Bld POSITIVE (*)    All other components within normal limits  PROTIME-INR  HIV ANTIBODY (ROUTINE TESTING W REFLEX)  MAGNESIUM  COMPREHENSIVE METABOLIC PANEL  CBC  PROTIME-INR  VITAMIN B12  FOLATE  IRON AND TIBC  FERRITIN  TYPE AND SCREEN  PREPARE RBC (CROSSMATCH)    EKG None  Radiology CT Abdomen Pelvis W Contrast  Result Date: 05/14/2022 CLINICAL DATA:  Concern for possible internal bleeding around percutaneous feeding tube. Clinical record notes history of throat cancer and laparotomy with repair of duodenal ulcer in July 2022. EXAM: CT ABDOMEN AND PELVIS WITH CONTRAST TECHNIQUE: Multidetector CT imaging of the abdomen and pelvis was performed using the standard protocol following bolus administration of intravenous contrast. RADIATION DOSE REDUCTION: This exam was performed according to the departmental dose-optimization program which includes automated exposure control, adjustment of the mA and/or kV according to patient size and/or use of iterative reconstruction technique. CONTRAST:  135m OMNIPAQUE IOHEXOL 300 MG/ML  SOLN COMPARISON:  CT abdomen pelvis 04/12/2021 FINDINGS: Lower chest: No acute abnormality. Hepatobiliary: No focal liver abnormality is seen. The gallbladder is distended measuring 4.5 cm transverse dimension (series 3, image 34). No gallbladder wall thickening, pericholecystic fat stranding, or gallstones. No bile duct dilatation. Pancreas: Unremarkable. No pancreatic ductal dilatation or surrounding inflammatory changes. Spleen: Normal in size without focal abnormality. Adrenals/Urinary Tract: Adrenal glands are unremarkable. A few circumscribed  hypodense lesions in the left kidney are consistent with benign cysts. No dedicated follow-up imaging is indicated. No hydronephrosis or perinephric fat stranding bilaterally. Bladder is unremarkable. Stomach/Bowel: Stomach is within normal limits. A percutaneous feeding tube is in appropriate position within the upper gastric body. Minimal remaining oral contrast within the stomach. A hyperdensity in the proximal duodenum is new compared to earlier same day abdominal radiograph and favored to represent the oral contrast as previously seen in the stomach. The oral contrast is seen throughout the normal caliber small bowel as well as throughout the colon and within the rectum. Appendix appears normal. No evidence of bowel wall thickening, distention, or inflammatory changes. Vascular/Lymphatic: Aortic atherosclerosis. No enlarged abdominal or pelvic lymph nodes. Reproductive: Mild prostatomegaly. Other: No free intraperitoneal air, extraluminal oral contrast, or abdominopelvic ascites. Musculoskeletal: Transitional anatomy of L5. No acute or suspicious osseous finding. IMPRESSION: 1. Percutaneous gastrostomy tube is in appropriate position within the upper gastric body. Oral contrast is seen throughout the small bowel, colon and rectum. No free intraperitoneal air or extraluminal contrast to suggest perforation. Of note, evaluation for gastrointestinal bleeding is limited on this single phase exam with oral contrast. If there is further clinical concern for gastrointestinal bleeding, recommend GI bleeding protocol (triple phase) CT abdomen pelvis with intravenous contrast once the oral contrast has cleared. 2. Distended gallbladder without CT findings of cholecystitis. Electronically Signed   By: LIleana RoupM.D.   On: 05/14/2022 19:12   DG ABDOMEN PEG TUBE LOCATION  Result Date: 05/14/2022 CLINICAL DATA:  Peg tube placement. EXAM: ABDOMEN - 1 VIEW COMPARISON:  Abdominal x-ray 05/13/2022 FINDINGS: Percutaneous  gastrostomy tube is in the stomach. Contrast is seen within nondilated stomach. Bowel-gas pattern is nonobstructive. There are phleboliths in the pelvis. Oral contrast is seen throughout the colon. No acute fractures are identified. IMPRESSION: 1. Peg tube in the stomach. Electronically Signed   By: ARonney AstersM.D.   On: 05/14/2022 16:08   DG ABDOMEN PEG TUBE LOCATION  Result Date: 05/13/2022 CLINICAL DATA:  Check position gastrostomy tube EXAM: ABDOMEN - 1 VIEW COMPARISON:  09/30/2021 FINDINGS: Contrast injected through the gastrostomy tube is noted within the fundus of the stomach. There is no extravasation of contrast. Bowel gas pattern is nonspecific. IMPRESSION: Tip of gastrostomy tube is noted within the fundus of the  stomach. Electronically Signed   By: Elmer Picker M.D.   On: 05/13/2022 14:48    Procedures .Critical Care  Performed by: Wilnette Kales, PA Authorized by: Wilnette Kales, PA   Critical care provider statement:    Critical care time (minutes):  45   Critical care was necessary to treat or prevent imminent or life-threatening deterioration of the following conditions:  Circulatory failure   Critical care was time spent personally by me on the following activities:  Development of treatment plan with patient or surrogate, discussions with consultants, evaluation of patient's response to treatment, examination of patient, ordering and review of laboratory studies, ordering and review of radiographic studies, ordering and performing treatments and interventions, pulse oximetry, re-evaluation of patient's condition and review of old charts   I assumed direction of critical care for this patient from another provider in my specialty: no     Care discussed with: admitting provider       Medications Ordered in ED Medications  pantoprazole (PROTONIX) injection 40 mg (40 mg Intravenous Given 05/14/22 2155)  morphine (PF) 2 MG/ML injection 1 mg (1 mg Intravenous Given  05/14/22 2154)  sodium chloride flush (NS) 0.9 % injection 3 mL (3 mLs Intravenous Not Given 05/14/22 2201)  acetaminophen (TYLENOL) suppository 650 mg (has no administration in time range)  ondansetron (ZOFRAN) injection 4 mg (has no administration in time range)  hydrALAZINE (APRESOLINE) injection 10 mg (10 mg Intravenous Given 05/14/22 2154)  lactated ringers infusion ( Intravenous New Bag/Given 05/14/22 2205)  fentaNYL (SUBLIMAZE) injection 50 mcg (50 mcg Intravenous Given 05/14/22 1632)  diatrizoate meglumine-sodium (GASTROGRAFIN) 66-10 % solution (30 mLs Per Tube Given 05/14/22 1605)  0.9 %  sodium chloride infusion (Manually program via Guardrails IV Fluids) ( Intravenous New Bag/Given 05/14/22 1830)  iohexol (OMNIPAQUE) 300 MG/ML solution 100 mL (100 mLs Intravenous Contrast Given 05/14/22 1813)  fentaNYL (SUBLIMAZE) injection 50 mcg (50 mcg Intravenous Given 05/14/22 1932)    ED Course/ Medical Decision Making/ A&P Clinical Course as of 05/14/22 2312  Wed May 14, 2022  1554 Stable chronically ill 63 YOM with ENT cancer. Had PEG tube placed yesterday. HGB 9.4 2 weeks ago. Had OP clinic appointment at [facility] today. Has HGB of 6.4 on OSH labs today. Diffuse abdominal pain. [CC]  2017 Consulted [facility] Dr. Lorenso Courier and she recommended admission of the patient, beginning IV PPI 40 mg twice a day and admitting through hospital medicine pending endoscopy. [CR]  2028 Consulted Dr. Posey Pronto of hospital medicine.  He agreed with admission of the patient and assuming further treatment/care. [CR]    Clinical Course User Index [CC] Tretha Sciara, MD [CR] Wilnette Kales, [place]                           Medical Decision Making Amount and/or Complexity of Data Reviewed Labs: ordered. Radiology: ordered.  Risk Prescription drug management. Decision regarding hospitalization.   This patient presents to the ED for concern of abdominal pain, this involves an extensive number of treatment options, and  is a complaint that carries with it a high risk of complications and morbidity.  The differential diagnosis includes peritonitis, diverticulosis, gastric ulcer, bowel perforation, malignancy,   Co morbidities that complicate the patient evaluation  See HPI   Additional history obtained:  Additional history obtained from EMR External records from outside source obtained and reviewed including 05/13/22 x-ray of abdomen for PEG tube placement   Lab Tests:  I Ordered, and personally interpreted labs.  The pertinent results include: Sodium 133, otherwise electrolytes within normal range.  Renal function within normal limits.  No transaminitis noted.  PT/INR within normal range.  aPTT elevated of 42.  Fecal occult blood positive.  No leukocytosis noted.  Significant anemia with hemoglobin of 5.7.  Platelets within normal range.   Imaging Studies ordered:  I ordered imaging studies including abdominal x-ray to assess PEG tube location.  CT abdomen pelvis with contrast. I independently visualized and interpreted imaging which showed  Abdominal x-ray: PEG tube in stomach CT abdomen pelvis with contrast: Percutaneous gastrostomy tube in appropriate position.  No intraperitoneal air extremital contrast to suggest perforation.  Distended gallbladder without CT findings of cholecystitis. I agree with the radiologist interpretation  Cardiac Monitoring: / EKG:  The patient was maintained on a cardiac monitor.  I personally viewed and interpreted the cardiac monitored which showed an underlying rhythm of: Sinus rhythm   Consultations Obtained:  See ED course  Problem List / ED Course / Critical interventions / Medication management  Anemia/abdominal pain I ordered medication including Zofran for nausea, morphine for pain.  Protonix for GI bleed.  Fentanyl for pain.  2 units of RBC for anemia. Reevaluation of the patient after these medicines showed that the patient improved I have reviewed  the patients home medicines and have made adjustments as needed   Social Determinants of Health:  Former cigarette use 9 of 21.  Denies illicit drug use.   Test / Admission - Considered:  Vitals signs significant for initial blood pressure 176/100. Otherwise within normal range and stable throughout visit. Laboratory/imaging studies significant for: See above Patient deemed to meet admission criteria given active bleed noted by POC Hemoccult positive as well as declining hemoglobin compared to 13 days ago.  Patient was transfused with 2 units of RBC while emergency department which showed some improvement of symptoms.  Unsure of exact etiology of patient's nonfocal abdominal pain at this point. Treatment plan were discussed at length with patient and they knowledge understanding was agreeable to said plan.  Appropriate consultations were made as described in the ED course.  Patient was stable upon admission to the hospital.         Final Clinical Impression(s) / ED Diagnoses Final diagnoses:  Gastrointestinal hemorrhage, unspecified gastrointestinal hemorrhage type  Anemia, unspecified type    Rx / DC Orders ED Discharge Orders     None         Wilnette Kales, [place] 05/14/22 2312    Tretha Sciara, MD 05/14/22 2348

## 2022-05-14 NOTE — Assessment & Plan Note (Signed)
BP elevated on admission.  Start on IV hydralazine as needed while NPO.

## 2022-05-14 NOTE — ED Triage Notes (Signed)
Pt went to [facility] for a cancer treatment this morning and had blood work done. Pt was called and told to come to the nearest hospital for transfusion because the patients hemoglobin was 6. They are also concerned about his feeding tube and if he is bleeding internally around the tube. Pt was admitted in July and had 3 units transfused and left with a hemoglobin of 9 and now it is back down to 6.

## 2022-05-14 NOTE — Assessment & Plan Note (Signed)
Reports continued occasional alcohol use.  No sign of withdrawal, monitor CIWA checks.

## 2022-05-14 NOTE — Assessment & Plan Note (Signed)
Lurline Idol was exchanged in ED to another #4 Shiley flex uncuffed due to occlusion.  Continue trach care.

## 2022-05-14 NOTE — Assessment & Plan Note (Signed)
Chronic PEG tube dependence.  PEG tube was exchanged on 8/29, day prior to admission.  Imaging shows tube is in place. -Nothing by tube -Start on maintenance IV fluids tonight while n.p.o.

## 2022-05-14 NOTE — Assessment & Plan Note (Signed)
S/p XRT, previous chemotherapy with progression.  Follows with [facility], Dr. Chryl Heck, and [facility], Dr. Myriam Forehand.  Planned to start Malin on 8/30 however sent to ED instead due to worsening anemia.

## 2022-05-14 NOTE — Procedures (Signed)
Tracheostomy Change Note  Patient Details:   Name: [name] DOB: 06-Mar-1951 MRN: [id number]    Airway Documentation:     Evaluation  O2 sats: stable throughout Complications: No apparent complications Patient did tolerate procedure well. Bilateral Breath Sounds: Diminished/Clear  RT x2 with MD at bedside changed pts trach to another #4 shiley flex uncuffed. After attempting to suction trach with no success, it was found to be occluded with dried/hard secretions which prompted [place] to change it out.    Lawernce Keas 05/14/2022, 8:47 PM

## 2022-05-14 NOTE — H&P (Signed)
History and Physical    [name] DXI:[id number] DOB: 1951/09/02 DOA: 05/14/2022  PCP: [name], MD  Patient coming from: Home  I have personally briefly reviewed patient's old medical records in [place]  Chief Complaint: Anemia  HPI: [name] is a 71 y.o. male with medical history significant for metastatic SCC of the supraglottic larynx (s/p primary XRT 07/06/2019, right neck dissection April 2021, then docetaxel 03/05/21-05/16/21 stopped after bowel perforation, carbo/taxol/pembro received until March 2023, cetuximab 01/07/22-02/25/22 with progression), s/p tracheostomy and PEG tube, anemia due to iron deficiency and chronic blood loss, duodenal ulcer, HTN, EtOH use who presented to the ED for evaluation of anemia.  Patient recently admitted at [facility] from 04/22/2022-04/27/2022 for anemia with melena.  Per documentation EGD 8/9 unable to scope due to anatomy but visualized GI tract via G-tube.  Nonbleeding duodenal ulcer was seen, biopsy showed acute and chronic duodenitis.  He was started on empiric H. pylori treatment with quad therapy (Pepto-Bismol, Flagyl, doxycycline, PPI).  He was given 2 unit PRBC transfusion and 1 g iron dextran with hemoglobin stabilized to 8.3 on discharge.  Patient was seen in the ED yesterday (8/29) for PEG tube malfunction.  PEG tube was replaced with a new 20 French PEG tube and placement was confirmed with x-ray with Gastrografin.  Patient had follow-up with his oncology clinic at [facility] with plan to start [facility] however labs showed hemoglobin dropped to 6.0.  He was advised to present to the ED for further evaluation.    Patient denies any obvious bleeding.  He says he does not take anything by mouth but does report occasional alcohol use, maybe a beer per week.  ED Course  Labs/Imaging on admission: I have personally reviewed following labs and imaging studies.  Initial vitals show BP 176/100, pulse 79, RR 18, temp 98.2 F, SPO2 100% on  room air.  Labs show hemoglobin 5.7, platelets 263,000, WBC 7.0, sodium 133, potassium 3.6, bicarb 24, BUN 15, creatinine 0.52, serum glucose 118.  FOBT positive with melena on exam per EDP.  Abdominal x-ray showed PEG tube in the stomach.  CT abdomen/pelvis with contrast again showed PEG tube in appropriate position within the upper gastric body.  Oral contrast seen throughout small bowel, colon, rectum.  No evidence of perforation.  Distended gallbladder without CT findings of cholecystitis noted.  Patient was ordered to receive 2 unit PRBC transfusion.  EDP consulted on-call [facility] who recommended starting IV PPI, n.p.o., medical admission and they will see in consultation.    While in the ED, patient's trach was exchanged to another #4 shiley flex uncuffed due to occlusion. The hospitalist service was consulted to admit for further evaluation and management.  Review of Systems: All systems reviewed and are negative except as documented in history of present illness above.   Past Medical History:  Diagnosis Date   Cancer ([place])    Throat cancer 2019   ETOH abuse    Frequent urination    Glaucoma    Hepatitis C virus infection without hepatic coma    dx'ed in 11/2018   History of radiation therapy 05/12/19- 07/06/19   Larynx   Hypertension    Wears denture    upper only; lost lower denture    Past Surgical History:  Procedure Laterality Date   ANKLE SURGERY  2011   right ankle   COLONOSCOPY  02/2019   polyps - Dr Havery Moros   DIRECT LARYNGOSCOPY N/A 04/01/2019   Procedure: DIRECT  LARYNGOSCOPY WITH BIOPSY;  Surgeon: Jodi Marble, MD;  Location: [place];  Service: ENT;  Laterality: N/A;   DIRECT LARYNGOSCOPY N/A 01/06/2020   Procedure: DIRECT LARYNGOSCOPY;  Surgeon: Izora Gala, MD;  Location: [place];  Service: ENT;  Laterality: N/A;   DIRECT LARYNGOSCOPY N/A 08/13/2020   Procedure: DIRECT LARYNGOSCOPY;  Surgeon: Izora Gala, MD;  Location: [place];   Service: ENT;  Laterality: N/A;   ESOPHAGOSCOPY N/A 04/01/2019   Procedure: ESOPHAGOSCOPY;  Surgeon: Jodi Marble, MD;  Location: [place];  Service: ENT;  Laterality: N/A;   EXCISION ORAL TUMOR Right 08/13/2020   Procedure: BIOPSY OF OROPHARYNGEAL MASS;  Surgeon: Izora Gala, MD;  Location: [place];  Service: ENT;  Laterality: Right;   EYE SURGERY Right    IR CM INJ ANY COLONIC TUBE W/FLUORO  12/20/2020   IR CM INJ ANY COLONIC TUBE W/FLUORO  03/20/2021   IR GASTROSTOMY TUBE MOD SED  09/24/2020   IR IMAGING GUIDED PORT INSERTION  09/24/2020   IR RADIOLOGIST EVAL & MGMT  07/02/2021   IR [place] GASTRO/COLONIC TUBE PERCUT W/FLUORO  01/10/2022   KNEE SURGERY     LAPAROTOMY N/A 04/12/2021   Procedure: EXPLORATORY LAPAROTOMY, REPAIR OF DUODENAL ULCER WITH Silvestre Gunner;  Surgeon: Felicie Morn, MD;  Location: [facility];  Service: General;  Laterality: N/A;   LARYNGOSCOPY AND BRONCHOSCOPY N/A 04/01/2019   Procedure: BRONCHOSCOPY;  Surgeon: Jodi Marble, MD;  Location: [place];  Service: ENT;  Laterality: N/A;   RADICAL NECK DISSECTION N/A 01/06/2020   Procedure: RADICAL NECK DISSECTION;  Surgeon: Izora Gala, MD;  Location: [place];  Service: ENT;  Laterality: N/A;    Social History:  reports that he quit smoking about 1 years ago. His smoking use included cigarettes. He has never used smokeless tobacco. He reports current alcohol use of about 4.0 standard drinks of alcohol per week. He reports current drug use. Drug: Cocaine.  No Known Allergies  Family History  Problem Relation Age of Onset   Breast cancer Sister    Colon cancer Brother 26       ????   Cancer Brother      Prior to Admission medications   Medication Sig Start Date End Date Taking? Authorizing Provider  atropine 1 % ophthalmic solution Place 1 drop into the right eye 2 (two) times a day.     [provider]  clindamycin (CLINDAGEL) 1 % gel Apply topically 2 (two) times daily. 03/11/22   [name], NP  losartan (COZAAR) 50 MG tablet Take 50 mg by mouth daily. 09/19/21   [provider]  magnesium oxide (MAG-OX) 400 (240 Mg) MG tablet Take 1 tablet (400 mg total) by mouth daily. 08/05/21   [name], MD  Nutritional Supplements (KATE [employer]ARMS STANDARD 1.4) LIQD 487 mLs by Enteral route 3 (three) times daily. 1.5 cartons (487 ml) [employer] 1.4 TID with 60 ml free water before and 60 ml free water after each TF bolus. Provide an additional 120 ml free water TID. 04/22/21   [name], DO  nystatin (MYCOSTATIN) 100000 UNIT/ML suspension Take 5 mLs (500,000 Units total) by mouth 4 (four) times daily. 08/12/21   Walisiewicz, Kaitlyn E, PA-C  ondansetron (ZOFRAN) 8 MG tablet Take 1 tablet (8 mg total) by mouth 2 (two) times daily as needed for refractory nausea / vomiting. Starting on day 3 after chemo. 12/31/21   [name], MD  oxyCODONE (OXY IR/ROXICODONE) 5 MG immediate  release tablet Take 1 tablet (5 mg total) by mouth every 6 (six) hours as needed for severe pain. 05/06/22   [name], MD  pantoprazole (PROTONIX) 40 MG tablet Take 40 mg by mouth 2 (two) times daily. 05/09/21   [provider]  prednisoLONE acetate (PRED FORTE) 1 % ophthalmic suspension Place 1 drop into the right eye 4 (four) times daily.    [provider]  prochlorperazine (COMPAZINE) 10 MG tablet Take 1 tablet (10 mg total) by mouth every 6 (six) hours as needed (Nausea or vomiting). 02/11/22   [name], MD  Vitamin D, Ergocalciferol, (DRISDOL) 1.25 MG (50000 UNIT) CAPS capsule Take 50,000 Units by mouth every Saturday. 05/22/20   [provider]  Water For Irrigation, Sterile (FREE WATER) SOLN Place 100 mLs into feeding tube every 4 (four) hours. 04/22/21   [name], DO    Physical Exam: Vitals:   05/14/22 2000 05/14/22 2026 05/14/22 2044 05/14/22 2054  BP: (!) 176/96     Pulse: 76   70  Resp: 16 (!) '21 20 20  '$ Temp:      TempSrc:      SpO2: 96% 100% 100%  100%   Constitutional: Chronically ill-appearing cachectic man sitting up in bed, appears fatigued Eyes: lids and conjunctivae normal ENMT: Mucous membranes are dry.  S/p tracheostomy with trach collar in place. Neck: normal, supple, no masses. Respiratory: Trach cuff in place.  Clear to auscultation . Normal respiratory effort. No accessory muscle use.  Cardiovascular: Regular rate and rhythm, no murmurs / rubs / gallops. No extremity edema. 2+ pedal pulses. Abdomen: PEG tube in place.  Mild generalized tenderness to palpation.  Bowel sounds present. Musculoskeletal: Thin extremities with muscle wasting throughout.  No clubbing / cyanosis. No joint deformity upper and lower extremities. Good ROM, no contractures.  Skin: no rashes, lesions, ulcers. No induration Neurologic: Sensation intact. Strength equal bilaterally. Psychiatric:  Alert and oriented x 3.  EKG: Personally reviewed. Sinus rhythm, rate 81, LVH.  Assessment/Plan Principal Problem:   Acute on chronic blood loss anemia Active Problems:   G tube feedings (HCC)   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HCC)   Tracheostomy present ([place])   Hypertension   Alcohol use   [name] is a 71 y.o. male with medical history significant for metastatic SCC of the supraglottic larynx (s/p primary XRT 07/06/2019, right neck dissection April 2021, then docetaxel 03/05/21-05/16/21 stopped after bowel perforation, carbo/taxol/pembro received until March 2023, cetuximab 01/07/22-02/25/22 with progression), s/p tracheostomy and PEG tube, anemia due to iron deficiency and chronic blood loss, duodenal ulcer, HTN, EtOH use who is admitted with acute on chronic anemia presumed secondary to upper GI bleeding.  Assessment and Plan: * Acute on chronic blood loss anemia Duodenal ulcer Iron deficiency Hemoglobin 5.7 on admission with positive FOBT and melena.  Recent admit at [facility] with nonbleeding duodenal ulcer and acute on chronic  duodenitis found on endoscopy with biopsy. -[facility] to see in consultation -Continue IV Protonix 40 mg keep BID -Keep n.p.o., nothing by PEG tube  G tube feedings (HCC) Chronic PEG tube dependence.  PEG tube was exchanged on 8/29, day prior to admission.  Imaging shows tube is in place. -Nothing by tube -Start on maintenance IV fluids tonight while n.p.o.  Tracheostomy present ([place]) Trach was exchanged in ED to another #4 Shiley flex uncuffed due to occlusion.  Continue trach care.  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HCC) S/p XRT, previous  chemotherapy with progression.  Follows with [facility] health oncology, Dr. Chryl Heck, and [facility] oncology, Dr. Myriam Forehand.  Planned to start [place] on 8/30 however sent to ED instead due to worsening anemia.  Hypertension BP elevated on admission.  Start on IV hydralazine as needed while NPO.  Alcohol use Reports continued occasional alcohol use.  No sign of withdrawal, monitor CIWA checks.  DVT prophylaxis: SCDs Start: 05/14/22 2103 Code Status: Full code, discussed with patient on admission Family Communication: Caregiver at bedside Disposition Plan: From home, dispo pending clinical progress Consults called: [facility] Severity of Illness: The appropriate patient status for this patient is INPATIENT. Inpatient status is judged to be reasonable and necessary in order to provide the required intensity of service to ensure the patient's safety. The patient's presenting symptoms, physical exam findings, and initial radiographic and laboratory data in the context of their chronic comorbidities is felt to place them at high risk for further clinical deterioration. Furthermore, it is not anticipated that the patient will be medically stable for discharge from the hospital within 2 midnights of admission.   * I certify that at the point of admission it is my clinical judgment that the patient will require inpatient hospital care spanning  beyond 2 midnights from the point of admission due to high intensity of service, high risk for further deterioration and high frequency of surveillance required.Zada Finders MD [facility]  If 7PM-7AM, please contact night-coverage [email or web address]  05/14/2022, 9:17 PM

## 2022-05-15 DIAGNOSIS — Z931 Gastrostomy status: Secondary | ICD-10-CM | POA: Diagnosis not present

## 2022-05-15 DIAGNOSIS — I10 Essential (primary) hypertension: Secondary | ICD-10-CM | POA: Diagnosis not present

## 2022-05-15 DIAGNOSIS — K264 Chronic or unspecified duodenal ulcer with hemorrhage: Principal | ICD-10-CM

## 2022-05-15 DIAGNOSIS — D649 Anemia, unspecified: Secondary | ICD-10-CM

## 2022-05-15 DIAGNOSIS — C321 Malignant neoplasm of supraglottis: Secondary | ICD-10-CM | POA: Diagnosis not present

## 2022-05-15 DIAGNOSIS — Z930 Tracheostomy status: Secondary | ICD-10-CM

## 2022-05-15 DIAGNOSIS — D62 Acute posthemorrhagic anemia: Secondary | ICD-10-CM | POA: Diagnosis not present

## 2022-05-15 LAB — CBC
HCT: 28.4 % — ABNORMAL LOW (ref 39.0–52.0)
Hemoglobin: 10.1 g/dL — ABNORMAL LOW (ref 13.0–17.0)
MCH: 32.4 pg (ref 26.0–34.0)
MCHC: 35.6 g/dL (ref 30.0–36.0)
MCV: 91 fL (ref 80.0–100.0)
Platelets: 262 10*3/uL (ref 150–400)
RBC: 3.12 MIL/uL — ABNORMAL LOW (ref 4.22–5.81)
RDW: 16.7 % — ABNORMAL HIGH (ref 11.5–15.5)
WBC: 10.9 10*3/uL — ABNORMAL HIGH (ref 4.0–10.5)
nRBC: 0 % (ref 0.0–0.2)

## 2022-05-15 LAB — IRON AND TIBC
Iron: 120 ug/dL (ref 45–182)
Saturation Ratios: 34 % (ref 17.9–39.5)
TIBC: 349 ug/dL (ref 250–450)
UIBC: 229 ug/dL

## 2022-05-15 LAB — TYPE AND SCREEN
ABO/RH(D): A POS
Antibody Screen: NEGATIVE
Unit division: 0
Unit division: 0

## 2022-05-15 LAB — COMPREHENSIVE METABOLIC PANEL
ALT: 11 U/L (ref 0–44)
AST: 17 U/L (ref 15–41)
Albumin: 3.7 g/dL (ref 3.5–5.0)
Alkaline Phosphatase: 32 U/L — ABNORMAL LOW (ref 38–126)
Anion gap: 16 — ABNORMAL HIGH (ref 5–15)
BUN: 8 mg/dL (ref 8–23)
CO2: 26 mmol/L (ref 22–32)
Calcium: 9.4 mg/dL (ref 8.9–10.3)
Chloride: 94 mmol/L — ABNORMAL LOW (ref 98–111)
Creatinine, Ser: 0.44 mg/dL — ABNORMAL LOW (ref 0.61–1.24)
GFR, Estimated: >60 mL/min (ref >60)
Glucose, Bld: 102 mg/dL — ABNORMAL HIGH (ref 70–99)
Potassium: 3.3 mmol/L — ABNORMAL LOW (ref 3.5–5.1)
Sodium: 136 mmol/L (ref 135–145)
Total Bilirubin: 1.3 mg/dL — ABNORMAL HIGH (ref 0.3–1.2)
Total Protein: 6.7 g/dL (ref 6.5–8.1)

## 2022-05-15 LAB — PROTIME-INR
INR: 1.1 (ref 0.8–1.2)
Prothrombin Time: 13.6 seconds (ref 11.4–15.2)

## 2022-05-15 LAB — BPAM RBC
Blood Product Expiration Date: 202309232359
Blood Product Expiration Date: 202309232359
ISSUE DATE / TIME: 202308301847
ISSUE DATE / TIME: 202308302141
Unit Type and Rh: 6200
Unit Type and Rh: 6200

## 2022-05-15 LAB — FERRITIN: Ferritin: 397 ng/mL — ABNORMAL HIGH (ref 24–336)

## 2022-05-15 LAB — FOLATE: Folate: 39.4 ng/mL (ref >5.9)

## 2022-05-15 LAB — MAGNESIUM: Magnesium: 1.7 mg/dL (ref 1.7–2.4)

## 2022-05-15 LAB — VITAMIN B12: Vitamin B-12: 663 pg/mL (ref 180–914)

## 2022-05-15 LAB — HIV ANTIBODY (ROUTINE TESTING W REFLEX): HIV Screen 4th Generation wRfx: NONREACTIVE

## 2022-05-15 MED ORDER — SUCRALFATE 1 GM/10ML PO SUSP
1.0000 g | Freq: Four times a day (QID) | ORAL | Status: DC
  Administered 2022-05-15 – 2022-05-18 (×14): 1 g
  Filled 2022-05-15 (×15): qty 10

## 2022-05-15 MED ORDER — DEXTROSE-NACL 5-0.45 % IV SOLN: INTRAVENOUS | Status: AC

## 2022-05-15 MED ORDER — NALOXONE HCL 0.4 MG/ML IJ SOLN: 0.4000 mg | INTRAMUSCULAR | Status: DC | PRN

## 2022-05-15 MED ORDER — POTASSIUM CHLORIDE 10 MEQ/100ML IV SOLN
10.0000 meq | Freq: Once | INTRAVENOUS | Status: AC
  Administered 2022-05-15: 10 meq via INTRAVENOUS
  Filled 2022-05-15: qty 100

## 2022-05-15 MED ORDER — HYDROMORPHONE HCL 1 MG/ML IJ SOLN
1.0000 mg | INTRAMUSCULAR | Status: DC | PRN
  Administered 2022-05-15 – 2022-05-19 (×25): 1 mg via INTRAVENOUS
  Filled 2022-05-15 (×26): qty 1

## 2022-05-15 MED ORDER — CHLORHEXIDINE GLUCONATE CLOTH 2 % EX PADS
6.0000 | MEDICATED_PAD | Freq: Every day | CUTANEOUS | Status: DC
  Administered 2022-05-15 – 2022-05-18 (×4): 6 via TOPICAL

## 2022-05-15 NOTE — Consult Note (Addendum)
Referring Provider: ? Primary Care Physician:  [name], MD Primary Gastroenterologist:  Dr. Havery Moros  Reason for Consultation:  Anemia  HPI: [name] is a 71 y.o. male with medical history significant for metastatic SCC of the supraglottic larynx (s/p primary XRT 07/06/2019, right neck dissection April 2021, then docetaxel 03/05/21-05/16/21 stopped after bowel perforation, carbo/taxol/pembro received until March 2023, cetuximab 01/07/22-02/25/22 with progression), s/p tracheostomy and PEG tube, anemia due to iron deficiency and chronic blood loss, duodenal ulcer s/p Phillip Heal patch in 03/2021, HTN, EtOH use who presented to the ED for evaluation of anemia.  Had outpatient Hgb yesterday around 6 grams.    Patient recently admitted at [facility] from 04/22/2022-04/27/2022 for anemia with melena.  Per documentation EGD 8/9 unable to scope due to anatomy but visualized GI tract via G-tube.  Nonbleeding duodenal ulcer was seen, biopsy showed acute and chronic duodenitis.  He was started on empiric H. pylori treatment with quad therapy (Pepto-Bismol, Flagyl, doxycycline, PPI).  He was given 2 unit PRBC transfusion and 1 g iron dextran with hemoglobin stabilized to 8.3 on discharge.  8/9 [facility] endoscopy impression: - The nasopharynx and oropharynx are abnormal. - Benign-appearing oropharyngeal/esophageal stenosis prevented anterograde endoscope passage. - Intact gastrostomy with a patent G-tube present characterized by healthy appearing mucosa. - The PEG was removed percutaneously and the 69 F EGD scope was inserted through the PEG site for endoscopic evaluation. - Normal mid esophagus and distal esophagus. - Large hiatal hernia. - Intact gastrostomy with a patent G-tube present characterized by minimal ulceration at the abdominal wall without bleeding. - Large, non-obstructing non-bleeding duodenal ulcer with an adherent clot (Forrest Class IIb). Biopsied to exclude a malignant process and to  assess for H. pylori.  A. Duodenal ulcer, endoscopic biopsy:   Acute and chronic duodenitis. Separate fragment of inflamed granulation tissue and ulcer exudate. Negative for malignancy.    Hgb was 5.7 grams yesterday in the ED.  Was transfused with 2 units packed red blood cells and is up to 10.1 g today.  Iron studies are good here after IV iron infusion at [facility] recently.  Stools had been dark, but obviously has been on Pepto-Bismol, but says that they were dark prior to that.  Here he says he had a BM just a little bit ago and they were more green color.  Says that he had tongue swelling from one of the medications for Hpylori so did not complete it.  Colonoscopy 02/2019 Dr. Havery Moros:  - The examined portion of the ileum was normal. - One 4 mm polyp in the ascending colon, removed with a cold snare. Resected and retrieved. - Diverticulosis in the entire examined colon. - Internal hemorrhoids. - The examination was otherwise normal. - Biopsies were taken with a cold forceps from the right colon and left colon for evaluation of microscopic colitis.   Past Medical History:  Diagnosis Date   Cancer ([place])    Throat cancer 2019   ETOH abuse    Frequent urination    Glaucoma    Hepatitis C virus infection without hepatic coma    dx'ed in 11/2018   History of radiation therapy 05/12/19- 07/06/19   Larynx   Hypertension    Wears denture    upper only; lost lower denture    Past Surgical History:  Procedure Laterality Date   ANKLE SURGERY  2011   right ankle   COLONOSCOPY  02/2019   polyps - Dr Havery Moros   DIRECT LARYNGOSCOPY N/A 04/01/2019  Procedure: DIRECT LARYNGOSCOPY WITH BIOPSY;  Surgeon: Jodi Marble, MD;  Location: [place];  Service: ENT;  Laterality: N/A;   DIRECT LARYNGOSCOPY N/A 01/06/2020   Procedure: DIRECT LARYNGOSCOPY;  Surgeon: Izora Gala, MD;  Location: [place];  Service: ENT;  Laterality: N/A;   DIRECT LARYNGOSCOPY N/A 08/13/2020   Procedure: DIRECT  LARYNGOSCOPY;  Surgeon: Izora Gala, MD;  Location: [place];  Service: ENT;  Laterality: N/A;   ESOPHAGOSCOPY N/A 04/01/2019   Procedure: ESOPHAGOSCOPY;  Surgeon: Jodi Marble, MD;  Location: [place];  Service: ENT;  Laterality: N/A;   EXCISION ORAL TUMOR Right 08/13/2020   Procedure: BIOPSY OF OROPHARYNGEAL MASS;  Surgeon: Izora Gala, MD;  Location: [place];  Service: ENT;  Laterality: Right;   EYE SURGERY Right    IR CM INJ ANY COLONIC TUBE W/FLUORO  12/20/2020   IR CM INJ ANY COLONIC TUBE W/FLUORO  03/20/2021   IR GASTROSTOMY TUBE MOD SED  09/24/2020   IR IMAGING GUIDED PORT INSERTION  09/24/2020   IR RADIOLOGIST EVAL & MGMT  07/02/2021   IR [place] GASTRO/COLONIC TUBE PERCUT W/FLUORO  01/10/2022   KNEE SURGERY     LAPAROTOMY N/A 04/12/2021   Procedure: EXPLORATORY LAPAROTOMY, REPAIR OF DUODENAL ULCER WITH Silvestre Gunner;  Surgeon: Felicie Morn, MD;  Location: [facility];  Service: General;  Laterality: N/A;   LARYNGOSCOPY AND BRONCHOSCOPY N/A 04/01/2019   Procedure: BRONCHOSCOPY;  Surgeon: Jodi Marble, MD;  Location: [place];  Service: ENT;  Laterality: N/A;   RADICAL NECK DISSECTION N/A 01/06/2020   Procedure: RADICAL NECK DISSECTION;  Surgeon: Izora Gala, MD;  Location: [place];  Service: ENT;  Laterality: N/A;    Prior to Admission medications   Medication Sig Start Date End Date Taking? Authorizing Provider  atropine 1 % ophthalmic solution Place 1 drop into the right eye 2 (two) times a day.     [provider]  clindamycin (CLINDAGEL) 1 % gel Apply topically 2 (two) times daily. 03/11/22   [name], NP  [employer] STOMACH RELIEF 525 MG/30ML suspension Place into feeding tube. 04/27/22   [provider]  doxycycline (ADOXA) 100 MG tablet SMARTSIG:1 Tablet(s) By Mouth Every 12 Hours 04/27/22   [provider]  gabapentin (NEURONTIN) 300 MG capsule Take 300 mg by mouth 3 (three) times daily. 04/27/22   [provider]  lansoprazole (PREVACID) 30 MG capsule SMARTSIG:1 Capsule(s) Gastro Tube Twice Daily 04/27/22   [provider]  LENVIMA, 10 MG DAILY DOSE, capsule Take by mouth. 04/15/22   [provider]  loperamide (IMODIUM) 2 MG capsule Take by mouth. 04/22/22   [provider]  losartan (COZAAR) 50 MG tablet Take 50 mg by mouth daily. 09/19/21   [provider]  magnesium oxide (MAG-OX) 400 (240 Mg) MG tablet Take 1 tablet (400 mg total) by mouth daily. 08/05/21   [name], MD  metroNIDAZOLE (FLAGYL) 500 MG tablet SMARTSIG:1 Tablet(s) Gastro Tube 3 Times Daily 04/27/22   [provider]  Nutritional Supplements (KATE FARMS STANDARD 1.4) LIQD 487 mLs by Enteral route 3 (three) times daily. 1.5 cartons (487 ml) [employer] 1.4 TID with 60 ml free water before and 60 ml free water after each TF bolus. Provide an additional 120 ml free water TID. 04/22/21   [name], DO  nystatin (MYCOSTATIN) 100000 UNIT/ML suspension Take 5 mLs (500,000 Units total) by mouth 4 (four) times daily. 08/12/21   [name], PA-C  ondansetron (  ZOFRAN) 8 MG tablet Take 1 tablet (8 mg total) by mouth 2 (two) times daily as needed for refractory nausea / vomiting. Starting on day 3 after chemo. 12/31/21   [name], MD  oxybutynin (DITROPAN) 5 MG tablet SMARTSIG:1 Tablet(s) Gastro Tube Twice Daily PRN 04/27/22   [provider]  oxyCODONE (OXY IR/ROXICODONE) 5 MG immediate release tablet Take 1 tablet (5 mg total) by mouth every 6 (six) hours as needed for severe pain. 05/06/22   [name], MD  pantoprazole (PROTONIX) 40 MG tablet Take 40 mg by mouth 2 (two) times daily. 05/09/21   [provider]  prednisoLONE acetate (PRED FORTE) 1 % ophthalmic suspension Place 1 drop into the right eye 4 (four) times daily.    [provider]  prochlorperazine (COMPAZINE) 10 MG tablet Take 1 tablet (10 mg total) by mouth every 6 (six) hours as needed  (Nausea or vomiting). 02/11/22   [name], MD  Vitamin D, Ergocalciferol, (DRISDOL) 1.25 MG (50000 UNIT) CAPS capsule Take 50,000 Units by mouth every Saturday. 05/22/20   [provider]  Water For Irrigation, Sterile (FREE WATER) SOLN Place 100 mLs into feeding tube every 4 (four) hours. 04/22/21   [name], DO    Current Facility-Administered Medications  Medication Dose Route Frequency Provider Last Rate Last Admin   acetaminophen (TYLENOL) suppository 650 mg  650 mg Rectal Q6H PRN Lenore Cordia, MD   650 mg at 05/15/22 0021   Chlorhexidine Gluconate Cloth 2 % PADS 6 each  6 each Topical Daily [name], MD   6 each at 05/15/22 8756   hydrALAZINE (APRESOLINE) injection 10 mg  10 mg Intravenous Q4H PRN Lenore Cordia, MD   10 mg at 05/15/22 4332   HYDROmorphone (DILAUDID) injection 1 mg  1 mg Intravenous Q3H PRN [name], MD       ondansetron [facility]) injection 4 mg  4 mg Intravenous Q6H PRN Lenore Cordia, MD       pantoprazole (PROTONIX) injection 40 mg  40 mg Intravenous Q12H Dion Saucier A, PA   40 mg at 05/14/22 2155   sodium chloride flush (NS) 0.9 % injection 3 mL  3 mL Intravenous Q12H Lenore Cordia, MD       Facility-Administered Medications Ordered in Other Encounters  Medication Dose Route Frequency Provider Last Rate Last Admin   heparin lock flush 100 unit/mL  500 Units Intracatheter Once [place], Arletha Pili, MD       sodium chloride flush (NS) 0.9 % injection 10 mL  10 mL Intracatheter Once [name], MD        Allergies as of 05/14/2022   (No Known Allergies)    Family History  Problem Relation Age of Onset   Breast cancer Sister    Colon cancer Brother 21       ????   Cancer Brother     Social History   Socioeconomic History   Marital status: Single    Spouse name: Not on file   Number of children: 2   Years of education: Not on file   Highest education level: Not on file  Occupational History   Not on file  Tobacco  Use   Smoking status: Former    Years: 50.00    Types: Cigarettes    Quit date: 06/01/2020    Years since quitting: 1.9   Smokeless tobacco: Never  Vaping Use   Vaping Use: Never used  Substance and Sexual  Activity   Alcohol use: Yes    Alcohol/week: 4.0 standard drinks of alcohol    Types: 4 Cans of beer per week    Comment: 40oz beer daily   Drug use: Yes    Types: Cocaine    Comment: none in 2 yrs   Sexual activity: Yes    Partners: Female  Other Topics Concern   Not on file  Social History Narrative   Patient is divorced with 2 children.   Patient is currently living with his sister.   Patient with a history of smoking a third of pack of cigarettes daily for 50 years.  Patient currently smoking 2 to 3 cigarettes/day.   Patient has never used smokeless tobacco.   Patient with occasional use of alcohol.   Patient last used cocaine approximately 6 months ago.  Patient denies use of marijuana.   Social Determinants of Health   Financial Resource Strain: Not on file  Food Insecurity: No Food Insecurity (09/21/2020)   Hunger Vital Sign    Worried About Running Out of Food in the Last Year: Never true    Ran Out of Food in the Last Year: Never true  Transportation Needs: Unmet Transportation Needs (03/05/2022)   PRAPARE - Hydrologist (Medical): Not on file    Lack of Transportation (Non-Medical): Yes  Physical Activity: Not on file  Stress: Not on file  Social Connections: Unknown (09/21/2020)   Social Connection and Isolation Panel [NHANES]    Frequency of Communication with Friends and Family: Three times a week    Frequency of Social Gatherings with Friends and Family: More than three times a week    Attends Religious Services: Not on file    Active Member of Clubs or Organizations: Not on file    Attends Archivist Meetings: Not on file    Marital Status: Not on file  Intimate Partner Violence: Not At Risk (04/27/2019)   Humiliation,  Afraid, Rape, and Kick questionnaire    Fear of Current or Ex-Partner: No    Emotionally Abused: No    Physically Abused: No    Sexually Abused: No    Review of Systems: ROS is O/W negative except as mentioned in HPI.  Physical Exam: Vital signs in last 24 hours: Temp:  [97.6 F (36.4 C)-98.8 F (37.1 C)] 98.2 F (36.8 C) (08/31 0747) Pulse Rate:  [44-97] 88 (08/31 0747) Resp:  [15-28] 18 (08/31 0747) BP: (118-199)/(73-153) 118/73 (08/31 0747) SpO2:  [80 %-100 %] 98 % (08/31 0747) FiO2 (%):  [28 %] 28 % (08/30 2044) Weight:  [42.5 kg] 42.5 kg (08/30 2330) Last BM Date : 05/15/22 General:  Alert, very thin, pleasant and cooperative in NAD Head:  Normocephalic and atraumatic. Eyes:  Sclera clear, no icterus.  Conjunctiva pink. Ears:  Normal auditory acuity. Mouth:  No deformity or lesions.   Lungs:  Clear throughout to auscultation.  No wheezes, crackles, or rhonchi.  Heart:  Regular rate and rhythm; no murmurs, clicks, rubs, or gallops. Abdomen:  Soft, non-distended.  BS present.  Non-tender.  G-tube noted.  Previous laparotomy scar noted.  Msk:  Symmetrical without gross deformities. Pulses:  Normal pulses noted. Extremities:  Without clubbing or edema. Neurologic:  Alert and oriented x 4;  grossly normal neurologically. Skin:  Intact without significant lesions or rashes. Psych:  Alert and cooperative. Normal mood and affect.  Intake/Output from previous day: 08/30 0701 - 08/31 0700 In: 1053.5 [I.V.:190.5; Blood:863] Out: 125 [  Urine:125]  Lab Results: Recent Labs    05/14/22 1556 05/15/22 0628  WBC 7.0 10.9*  HGB 5.7* 10.1*  HCT 17.6* 28.4*  PLT 263 262   BMET Recent Labs    05/14/22 1556 05/15/22 0628  NA 133* 136  K 3.6 3.3*  CL 100 94*  CO2 24 26  GLUCOSE 118* 102*  BUN 15 8  CREATININE 0.52* 0.44*  CALCIUM 8.9 9.4   LFT Recent Labs    05/15/22 0628  PROT 6.7  ALBUMIN 3.7  AST 17  ALT 11  ALKPHOS 32*  BILITOT 1.3*   PT/INR Recent Labs     05/14/22 1556 05/15/22 0628  LABPROT 13.6 13.6  INR 1.1 1.1    Studies/Results: CT Abdomen Pelvis W Contrast  Result Date: 05/14/2022 CLINICAL DATA:  Concern for possible internal bleeding around percutaneous feeding tube. Clinical record notes history of throat cancer and laparotomy with repair of duodenal ulcer in July 2022. EXAM: CT ABDOMEN AND PELVIS WITH CONTRAST TECHNIQUE: Multidetector CT imaging of the abdomen and pelvis was performed using the standard protocol following bolus administration of intravenous contrast. RADIATION DOSE REDUCTION: This exam was performed according to the departmental dose-optimization program which includes automated exposure control, adjustment of the mA and/or kV according to patient size and/or use of iterative reconstruction technique. CONTRAST:  156m OMNIPAQUE IOHEXOL 300 MG/ML  SOLN COMPARISON:  CT abdomen pelvis 04/12/2021 FINDINGS: Lower chest: No acute abnormality. Hepatobiliary: No focal liver abnormality is seen. The gallbladder is distended measuring 4.5 cm transverse dimension (series 3, image 34). No gallbladder wall thickening, pericholecystic fat stranding, or gallstones. No bile duct dilatation. Pancreas: Unremarkable. No pancreatic ductal dilatation or surrounding inflammatory changes. Spleen: Normal in size without focal abnormality. Adrenals/Urinary Tract: Adrenal glands are unremarkable. A few circumscribed hypodense lesions in the left kidney are consistent with benign cysts. No dedicated follow-up imaging is indicated. No hydronephrosis or perinephric fat stranding bilaterally. Bladder is unremarkable. Stomach/Bowel: Stomach is within normal limits. A percutaneous feeding tube is in appropriate position within the upper gastric body. Minimal remaining oral contrast within the stomach. A hyperdensity in the proximal duodenum is new compared to earlier same day abdominal radiograph and favored to represent the oral contrast as previously seen  in the stomach. The oral contrast is seen throughout the normal caliber small bowel as well as throughout the colon and within the rectum. Appendix appears normal. No evidence of bowel wall thickening, distention, or inflammatory changes. Vascular/Lymphatic: Aortic atherosclerosis. No enlarged abdominal or pelvic lymph nodes. Reproductive: Mild prostatomegaly. Other: No free intraperitoneal air, extraluminal oral contrast, or abdominopelvic ascites. Musculoskeletal: Transitional anatomy of L5. No acute or suspicious osseous finding. IMPRESSION: 1. Percutaneous gastrostomy tube is in appropriate position within the upper gastric body. Oral contrast is seen throughout the small bowel, colon and rectum. No free intraperitoneal air or extraluminal contrast to suggest perforation. Of note, evaluation for gastrointestinal bleeding is limited on this single phase exam with oral contrast. If there is further clinical concern for gastrointestinal bleeding, recommend GI bleeding protocol (triple phase) CT abdomen pelvis with intravenous contrast once the oral contrast has cleared. 2. Distended gallbladder without CT findings of cholecystitis. Electronically Signed   By: LIleana RoupM.D.   On: 05/14/2022 19:12   DG ABDOMEN PEG TUBE LOCATION  Result Date: 05/14/2022 CLINICAL DATA:  Peg tube placement. EXAM: ABDOMEN - 1 VIEW COMPARISON:  Abdominal x-ray 05/13/2022 FINDINGS: Percutaneous gastrostomy tube is in the stomach. Contrast is seen within nondilated stomach. Bowel-gas pattern  is nonobstructive. There are phleboliths in the pelvis. Oral contrast is seen throughout the colon. No acute fractures are identified. IMPRESSION: 1. Peg tube in the stomach. Electronically Signed   By: Ronney Asters M.D.   On: 05/14/2022 16:08   DG ABDOMEN PEG TUBE LOCATION  Result Date: 05/13/2022 CLINICAL DATA:  Check position gastrostomy tube EXAM: ABDOMEN - 1 VIEW COMPARISON:  09/30/2021 FINDINGS: Contrast injected through the  gastrostomy tube is noted within the fundus of the stomach. There is no extravasation of contrast. Bowel gas pattern is nonspecific. IMPRESSION: Tip of gastrostomy tube is noted within the fundus of the stomach. Electronically Signed   By: Elmer Picker M.D.   On: 05/13/2022 14:48    IMPRESSION:  *Anemia with drop in hemoglobin of close to 4 g from 9.4 g to 5.7 g within about 2 weeks.  He is heme positive.  He received 2 units of packed red blood cells and hemoglobin this morning is up to 10.1 g. *Metastatic SCC of the supraglottic larynx s/p radiation/right neck dissection/chemo, malnutrition s/p G-tube  *Duodenal ulcer requiring exploratory laparotomy and repair with Phillip Heal patch in July 2022.  Recent EGD at [facility] via G-tube (could not scope via regular EGD due to altered anatomy).  Nonbleeding duodenal ulcer was seen, biopsy showed acute and chronic duodenitis.  He was started on empiric H. pylori treatment with quad therapy (Pepto-Bismol, Flagyl, doxycycline, PPI).  He did not complete Hpylori treatment because reported tongue swelling with one of the medications.  PLAN: -Holding off on EGD as likely will not be able to offer any other therapeutic options since these are unable to be done via scoping through the G-tube. -Continue pantoprazole 40 mg IV BID for now. -Will add carafate suspension four times a day via G-tube. -Will ask pharmacy to make a recommendation regarding another Hpylori regimen that would ok for him to try. -Trend labs and transfuse further if needed. -Hold tube feeds for now.   Laban Emperor. Zehr  05/15/2022, 9:38 AM   I have taken a history, reviewed the chart and examined the patient. I performed a substantive portion of this encounter, including complete performance of at least one of the key components, in conjunction with the APP. I agree with the APP's note, impression and recommendations  71 year old male with history of laryngeal squamous cell carcinoma, G-tube  dependent for all nutrition and medications, admitted with hemoglobin of 6 in the setting of dark stools.  As mentioned above, the patient was admitted earlier this month with melena and anemia.  Endoscopic examination was limited due to severe pharyngeal stenosis which prevented passage of an endoscope.  A neonatal endoscope was passed through his gastrostomy, and noted a large 3 cm duo denal ulcer with adherent clot.  Endoscopic interventions not possible with a small endoscope.  Biopsies were negative for H. pylori but he was treated empirically (based on a history of H. pylori last year). The patient is a very poor historian, but it seems that he stopped taking the H. pylori medications because of tongue swelling.  Patient reports he passed a green stool this morning.  His hemoglobin increased to 10 from 6 following 2 units of packed red blood cells (suspect this 10 is likely not accurate). It does not appear that he is actively hemorrhaging.  He is hemodynamically stable. I think that the source of his bleeding is most likely the known duodenal ulcer.  Given that we cannot perform any hemostatic interventions (only able to access  the ulcer through his gastrostomy with neonatal scope), I do not feel that repeating endoscopy is necessary or useful.  I think we are limited to medical management currently, but if the patient exhibits brisk bleeding, then IR should be consulted.  Recommend treating the ulcer with BID PPI IV, adding carafate via PEG (make sure to flush well) and will need to discuss trying another H. Pylori regimen due to suspected allergic reaction to some component of his quadruple therapy.  Would hold tube feeds until tomorrow to limit potential exacerbation of bleeding.  Nickoli Bagheri E. Candis Schatz, MD [facility]

## 2022-05-15 NOTE — Progress Notes (Signed)
[facility]  PROGRESS NOTE  [name] ZDG:[id number] DOB: [birth year] DOA: 05/14/2022 PCP: [name], MD   Brief HPI:   71 y.o. male with medical history significant for metastatic SCC of the supraglottic larynx (s/p primary XRT 07/06/2019, right neck dissection April 2021, then docetaxel 03/05/21-05/16/21 stopped after bowel perforation, carbo/taxol/pembro received until March 2023, cetuximab 01/07/22-02/25/22 with progression), s/p tracheostomy and PEG tube, anemia due to iron deficiency and chronic blood loss, duodenal ulcer, HTN, EtOH use who presented to the ED for evaluation of anemia. Patient was recently admitted at [facility] from 04/22/2022 to 04/27/2022 for anemia and melena.  EGD from 04/23/2022 unable to scope due to anatomy but visualized GI tract via G-tube.  Nonbleeding duodenal ulcer was seen, biopsy showed acute on chronic duodenitis.  He was started on empiric treatment for H. pylori with quad therapy including Pepto-Bismol, Flagyl, doxycycline, PPI.  He was given 2 units PRBC and 1 g iron dextran with hemoglobin stabilized to 8.3 on discharge. He was in the ED on 8/29 for PEG tube malfunction.  PEG tube was replaced with new 20 French PEG tube and placement was confirmed with x-ray with Gastrografin.  Patient had follow-up with oncology clinic at [facility] and plan to start [facility] however labs showed drop of hemoglobin to 6.0.  He was advised to come to the ED for further evaluation. Patient denies obvious bleeding CT abdomen/pelvis with contrast showed PEG tube in appropriate position in the upper gastric body.  No evidence of perforation.  2 units PRBC ordered [facility]  consulted.  While in the ED, patient's trach was exchanged to another #4 shiley flex uncuffed due to occlusion  Subjective   Patient seen and examined, complains of facial and neck pain.   Assessment/Plan:    Acute on chronic blood loss anemia -Duodenal ulcer/iron deficiency -Patient's hemoglobin was 5.7 on  admission with positive FOBT and melena -Recent admission at [facility] showed nonbleeding duodenal ulcer with acute on chronic duodenitis found on endoscopy with biopsy -[facility] consulted -Continue Protonix 40 mg IV every 12 hours  G-tube feeding -Patient is chronically PEG tube dependent -PEG tube was exchanged on 8/29 -Tube feeds on hold  Tracheostomy present -Lurline Idol was exchanged in the ED to another #4 Shiley flex uncuffed due to occlusion -Current trach care  Squamous cell carcinoma of right supraglottis -Status post XRT, previous chemotherapy with progression -Follows [place] oncology -There was plan to start [place] on 8/30 however sent to ED for worsening anemia  Hypertension -Blood pressure is stable  Alcohol abuse -Reports occasional alcohol use -No signs and symptoms of alcohol withdrawal at this time -Continue Ativan per CIWA protocol  Hypokalemia -Replace potassium and follow BMP in am    Medications     Chlorhexidine Gluconate Cloth  6 each Topical Daily   pantoprazole (PROTONIX) IV  40 mg Intravenous Q12H   sodium chloride flush  3 mL Intravenous Q12H   sucralfate  1 g Per Tube Q6H     Data Reviewed:   CBG:  No results for input(s): "GLUCAP" in the last 168 hours.  SpO2: 99 % O2 Flow Rate (L/min): 5 L/min FiO2 (%): 28 %    Vitals:   05/15/22 0414 05/15/22 0600 05/15/22 0747 05/15/22 1125  BP:  (!) 168/107 118/73 103/66  Pulse:  97 88 79  Resp:   18 18  Temp:  98.8 F (37.1 C) 98.2 F (36.8 C)   TempSrc:  Oral Oral   SpO2: 99% 99% 98%  99%  Weight:      Height:          Data Reviewed:  Basic Metabolic Panel: Recent Labs  Lab 05/14/22 1556 05/15/22 0628  NA 133* 136  K 3.6 3.3*  CL 100 94*  CO2 24 26  GLUCOSE 118* 102*  BUN 15 8  CREATININE 0.52* 0.44*  CALCIUM 8.9 9.4  MG  --  1.7    CBC: Recent Labs  Lab 05/14/22 1556 05/15/22 0628  WBC 7.0 10.9*  NEUTROABS 5.6  --   HGB 5.7* 10.1*  HCT 17.6* 28.4*  MCV 98.9  91.0  PLT 263 262    LFT Recent Labs  Lab 05/14/22 1556 05/15/22 0628  AST 13* 17  ALT 10 11  ALKPHOS 27* 32*  BILITOT 0.3 1.3*  PROT 6.3* 6.7  ALBUMIN 3.4* 3.7     Antibiotics: Anti-infectives (From admission, onward)    None        DVT prophylaxis: SCDs  Code Status: Full code  Family Communication: No family at bedside   CONSULTS    Objective    Physical Examination:   General: Appears in mild distress Cardiovascular: S1-S2, regular, no murmur auscultated Neck-tracheostomy in place Respiratory: Clear to auscultation bilaterally Abdomen: Abdomen is soft, nontender, no organomegaly Extremities: No edema in the lower extremities Neurologic: Alert, oriented x3, intact insight and judgment, moving all extremities   Status is: Inpatient:             [name]   [facility] If 7PM-7AM, please contact night-coverage at [email or web address], Office  [phone]   05/15/2022, 1:36 PM  LOS: 1 day

## 2022-05-16 DIAGNOSIS — C321 Malignant neoplasm of supraglottis: Secondary | ICD-10-CM | POA: Diagnosis not present

## 2022-05-16 DIAGNOSIS — K922 Gastrointestinal hemorrhage, unspecified: Secondary | ICD-10-CM | POA: Diagnosis not present

## 2022-05-16 DIAGNOSIS — D62 Acute posthemorrhagic anemia: Secondary | ICD-10-CM | POA: Diagnosis not present

## 2022-05-16 DIAGNOSIS — K264 Chronic or unspecified duodenal ulcer with hemorrhage: Secondary | ICD-10-CM | POA: Diagnosis not present

## 2022-05-16 DIAGNOSIS — Z931 Gastrostomy status: Secondary | ICD-10-CM | POA: Diagnosis not present

## 2022-05-16 DIAGNOSIS — I10 Essential (primary) hypertension: Secondary | ICD-10-CM | POA: Diagnosis not present

## 2022-05-16 LAB — CBC
HCT: 28.3 % — ABNORMAL LOW (ref 39.0–52.0)
Hemoglobin: 9.6 g/dL — ABNORMAL LOW (ref 13.0–17.0)
MCH: 31.4 pg (ref 26.0–34.0)
MCHC: 33.9 g/dL (ref 30.0–36.0)
MCV: 92.5 fL (ref 80.0–100.0)
Platelets: 262 10*3/uL (ref 150–400)
RBC: 3.06 MIL/uL — ABNORMAL LOW (ref 4.22–5.81)
RDW: 17.1 % — ABNORMAL HIGH (ref 11.5–15.5)
WBC: 8.1 10*3/uL (ref 4.0–10.5)
nRBC: 0 % (ref 0.0–0.2)

## 2022-05-16 LAB — COMPREHENSIVE METABOLIC PANEL
ALT: 11 U/L (ref 0–44)
AST: 16 U/L (ref 15–41)
Albumin: 3.3 g/dL — ABNORMAL LOW (ref 3.5–5.0)
Alkaline Phosphatase: 27 U/L — ABNORMAL LOW (ref 38–126)
Anion gap: 10 (ref 5–15)
BUN: 6 mg/dL — ABNORMAL LOW (ref 8–23)
CO2: 26 mmol/L (ref 22–32)
Calcium: 9.2 mg/dL (ref 8.9–10.3)
Chloride: 96 mmol/L — ABNORMAL LOW (ref 98–111)
Creatinine, Ser: 0.47 mg/dL — ABNORMAL LOW (ref 0.61–1.24)
GFR, Estimated: >60 mL/min (ref >60)
Glucose, Bld: 104 mg/dL — ABNORMAL HIGH (ref 70–99)
Potassium: 3.2 mmol/L — ABNORMAL LOW (ref 3.5–5.1)
Sodium: 132 mmol/L — ABNORMAL LOW (ref 135–145)
Total Bilirubin: 0.9 mg/dL (ref 0.3–1.2)
Total Protein: 6.5 g/dL (ref 6.5–8.1)

## 2022-05-16 MED ORDER — LEVOFLOXACIN 25 MG/ML PO SOLN
500.0000 mg | Freq: Every day | ORAL | Status: DC
  Administered 2022-05-16 – 2022-05-17 (×2): 500 mg
  Filled 2022-05-16 (×4): qty 20

## 2022-05-16 MED ORDER — DEXTROSE-NACL 5-0.45 % IV SOLN
INTRAVENOUS | Status: AC
Start: 2022-05-16 — End: 2022-05-17

## 2022-05-16 MED ORDER — POTASSIUM CHLORIDE 10 MEQ/100ML IV SOLN
10.0000 meq | INTRAVENOUS | Status: AC
  Administered 2022-05-16 (×4): 10 meq via INTRAVENOUS
  Filled 2022-05-16 (×4): qty 100

## 2022-05-16 MED ORDER — AMOXICILLIN 250 MG/5ML PO SUSR
750.0000 mg | Freq: Three times a day (TID) | ORAL | Status: DC
  Administered 2022-05-16 – 2022-05-19 (×8): 750 mg
  Filled 2022-05-16 (×14): qty 15

## 2022-05-16 NOTE — Progress Notes (Addendum)
[facility] Progress Note  CC:  Anemia and GI bleed  Subjective:  Feels fine.  No BM today.  No new complaints  Objective:  Vital signs in last 24 hours: Temp:  [98.2 F (36.8 C)-98.5 F (36.9 C)] 98.4 F (36.9 C) (09/01 0747) Pulse Rate:  [62-91] 70 (09/01 0830) Resp:  [18-20] 18 (09/01 0830) BP: (103-172)/(66-103) 154/92 (09/01 0830) SpO2:  [99 %-100 %] 100 % (09/01 0830) Weight:  [41.7 kg] 41.7 kg (09/01 0424) Last BM Date : 05/15/22 General:  Alert, thin and chronically ill-appearing Heart:  Regular rate and rhythm; no murmurs Pulm:  CTAB.  No W/R/R. Abdomen:  Soft, non-distended.  BS present.  G-tube noted. Extremities:  Without edema.  Intake/Output from previous day: 08/31 0701 - 09/01 0700 In: 176.8 [I.V.:76.8; IV Piggyback:100] Out: 350 [Urine:350]  Lab Results: Recent Labs    05/14/22 1556 05/15/22 0628 05/16/22 0506  WBC 7.0 10.9* 8.1  HGB 5.7* 10.1* 9.6*  HCT 17.6* 28.4* 28.3*  PLT 263 262 262   BMET Recent Labs    05/14/22 1556 05/15/22 0628 05/16/22 0506  NA 133* 136 132*  K 3.6 3.3* 3.2*  CL 100 94* 96*  CO2 '24 26 26  '$ GLUCOSE 118* 102* 104*  BUN 15 8 6*  CREATININE 0.52* 0.44* 0.47*  CALCIUM 8.9 9.4 9.2   LFT Recent Labs    05/16/22 0506  PROT 6.5  ALBUMIN 3.3*  AST 16  ALT 11  ALKPHOS 27*  BILITOT 0.9   PT/INR Recent Labs    05/14/22 1556 05/15/22 0628  LABPROT 13.6 13.6  INR 1.1 1.1   CT Abdomen Pelvis W Contrast  Result Date: 05/14/2022 CLINICAL DATA:  Concern for possible internal bleeding around percutaneous feeding tube. Clinical record notes history of throat cancer and laparotomy with repair of duodenal ulcer in July 2022. EXAM: CT ABDOMEN AND PELVIS WITH CONTRAST TECHNIQUE: Multidetector CT imaging of the abdomen and pelvis was performed using the standard protocol following bolus administration of intravenous contrast. RADIATION DOSE REDUCTION: This exam was performed according to the  departmental dose-optimization program which includes automated exposure control, adjustment of the mA and/or kV according to patient size and/or use of iterative reconstruction technique. CONTRAST:  138m OMNIPAQUE IOHEXOL 300 MG/ML  SOLN COMPARISON:  CT abdomen pelvis 04/12/2021 FINDINGS: Lower chest: No acute abnormality. Hepatobiliary: No focal liver abnormality is seen. The gallbladder is distended measuring 4.5 cm transverse dimension (series 3, image 34). No gallbladder wall thickening, pericholecystic fat stranding, or gallstones. No bile duct dilatation. Pancreas: Unremarkable. No pancreatic ductal dilatation or surrounding inflammatory changes. Spleen: Normal in size without focal abnormality. Adrenals/Urinary Tract: Adrenal glands are unremarkable. A few circumscribed hypodense lesions in the left kidney are consistent with benign cysts. No dedicated follow-up imaging is indicated. No hydronephrosis or perinephric fat stranding bilaterally. Bladder is unremarkable. Stomach/Bowel: Stomach is within normal limits. A percutaneous feeding tube is in appropriate position within the upper gastric body. Minimal remaining oral contrast within the stomach. A hyperdensity in the proximal duodenum is new compared to earlier same day abdominal radiograph and favored to represent the oral contrast as previously seen in the stomach. The oral contrast is seen throughout the normal caliber small bowel as well as throughout the colon and within the rectum. Appendix appears normal. No evidence of bowel wall thickening, distention, or inflammatory changes. Vascular/Lymphatic: Aortic atherosclerosis. No enlarged abdominal or pelvic lymph nodes. Reproductive: Mild prostatomegaly. Other: No free intraperitoneal air, extraluminal oral contrast, or  abdominopelvic ascites. Musculoskeletal: Transitional anatomy of L5. No acute or suspicious osseous finding. IMPRESSION: 1. Percutaneous gastrostomy tube is in appropriate position  within the upper gastric body. Oral contrast is seen throughout the small bowel, colon and rectum. No free intraperitoneal air or extraluminal contrast to suggest perforation. Of note, evaluation for gastrointestinal bleeding is limited on this single phase exam with oral contrast. If there is further clinical concern for gastrointestinal bleeding, recommend GI bleeding protocol (triple phase) CT abdomen pelvis with intravenous contrast once the oral contrast has cleared. 2. Distended gallbladder without CT findings of cholecystitis. Electronically Signed   By: Ileana Roup M.D.   On: 05/14/2022 19:12   DG ABDOMEN PEG TUBE LOCATION  Result Date: 05/14/2022 CLINICAL DATA:  Peg tube placement. EXAM: ABDOMEN - 1 VIEW COMPARISON:  Abdominal x-ray 05/13/2022 FINDINGS: Percutaneous gastrostomy tube is in the stomach. Contrast is seen within nondilated stomach. Bowel-gas pattern is nonobstructive. There are phleboliths in the pelvis. Oral contrast is seen throughout the colon. No acute fractures are identified. IMPRESSION: 1. Peg tube in the stomach. Electronically Signed   By: Ronney Asters M.D.   On: 05/14/2022 16:08    Assessment / Plan: *Anemia with drop in hemoglobin of close to 4 g from 9.4 g to 5.7 g within about 2 weeks.  He is heme positive.  He received 2 units of packed red blood cells and hemoglobin is stable at 9.6 grams. *Metastatic SCC of the supraglottic larynx s/p radiation/right neck dissection/chemo, malnutrition s/p G-tube  *Duodenal ulcer requiring exploratory laparotomy and repair with Phillip Heal patch in July 2022.  Recent EGD at [facility] via G-tube (could not scope via regular EGD due to altered anatomy).  Nonbleeding duodenal ulcer was seen, biopsy showed acute and chronic duodenitis.  He was started on empiric H. pylori treatment with quad therapy (Pepto-Bismol, Flagyl, doxycycline, PPI).  He did not complete Hpylori treatment because reported tongue swelling with one of the  medications.  -Holding off on EGD as likely will not be able to offer any other therapeutic options since these are unable to be done via scoping through the G-tube. -Continue pantoprazole 40 mg IV BID for now. -Will add carafate suspension four times a day via G-tube. -Trend labs and transfuse further if needed. -Would treat with levofloxacin 500 mg daily, amoxicillin 750 mg 3 times daily, and twice daily PPI therapy for his H. pylori.  I have started that here so that we can monitor for any allergic reactions. -Will consult nutrition regarding restarting his tube feeds.    LOS: 2 days   Laban Emperor. Zehr  05/16/2022, 11:09 AM   I have taken a history, reviewed the chart and examined the patient. I performed a substantive portion of this encounter, including complete performance of at least one of the key components, in conjunction with the APP. I agree with the APP's note, impression and recommendations  Patient's hgb stable, no overt GI bleeding since admission.  Agree with restarting tube feeds, continue BID PPI, carafate. Will empirically try levaquin/amoxicillin for H. Pylori Should be stable for discharge tomorrow if no e/o active bleeding GI will follow peripherally if patient remains in hospital over weekend.  Carisa Backhaus E. Candis Schatz, MD [facility]

## 2022-05-16 NOTE — Progress Notes (Signed)
[facility]  PROGRESS NOTE  [name] PQZ:[id number] DOB: 06/22/1951 DOA: 05/14/2022 PCP: [name], MD   Brief HPI:   71 y.o. male with medical history significant for metastatic SCC of the supraglottic larynx (s/p primary XRT 07/06/2019, right neck dissection April 2021, then docetaxel 03/05/21-05/16/21 stopped after bowel perforation, carbo/taxol/pembro received until March 2023, cetuximab 01/07/22-02/25/22 with progression), s/p tracheostomy and PEG tube, anemia due to iron deficiency and chronic blood loss, duodenal ulcer, HTN, EtOH use who presented to the ED for evaluation of anemia. Patient was recently admitted at [facility] from 04/22/2022 to 04/27/2022 for anemia and melena.  EGD from 04/23/2022 unable to scope due to anatomy but visualized GI tract via G-tube.  Nonbleeding duodenal ulcer was seen, biopsy showed acute on chronic duodenitis.  He was started on empiric treatment for H. pylori with quad therapy including Pepto-Bismol, Flagyl, doxycycline, PPI.  He was given 2 units PRBC and 1 g iron dextran with hemoglobin stabilized to 8.3 on discharge. He was in the ED on 8/29 for PEG tube malfunction.  PEG tube was replaced with new 20 French PEG tube and placement was confirmed with x-ray with Gastrografin.  Patient had follow-up with oncology clinic at [facility] and plan to start [facility] however labs showed drop of hemoglobin to 6.0.  He was advised to come to the ED for further evaluation. Patient denies obvious bleeding CT abdomen/pelvis with contrast showed PEG tube in appropriate position in the upper gastric body.  No evidence of perforation.  2 units PRBC ordered [facility]  consulted.  While in the ED, patient's trach was exchanged to another #4 shiley flex uncuffed due to occlusion  Subjective   Patient seen, no new complaints.  Tube feeds still on hold.   Assessment/Plan:    Acute on chronic blood loss anemia -Duodenal ulcer/iron deficiency -Patient's hemoglobin was 5.7 on  admission with positive FOBT and melena -Recent admission at [facility] showed nonbleeding duodenal ulcer with acute on chronic duodenitis found on endoscopy with biopsy -[facility] consulted -Continue Protonix 40 mg IV every 12 hours -Started on Carafate 1 g p.o. every 6 hours  G-tube feeding -Patient is chronically PEG tube dependent -PEG tube was exchanged on 8/29 -Tube feeds on hold  Hypokalemia -Potassium was 3.2 -Potassium being replaced -Follow BMP in am  Tracheostomy present -Trach was exchanged in the ED to another #4 Shiley flex uncuffed due to occlusion -Current trach care  Squamous cell carcinoma of right supraglottis -Status post XRT, previous chemotherapy with progression -Follows [facility] oncology -There was plan to start [place] on 8/30 however sent to ED for worsening anemia  Hypertension -Blood pressure is stable  Alcohol abuse -Reports occasional alcohol use -No signs and symptoms of alcohol withdrawal at this time -Continue Ativan per CIWA protocol      Medications     Chlorhexidine Gluconate Cloth  6 each Topical Daily   pantoprazole (PROTONIX) IV  40 mg Intravenous Q12H   sodium chloride flush  3 mL Intravenous Q12H   sucralfate  1 g Per Tube Q6H     Data Reviewed:   CBG:  No results for input(s): "GLUCAP" in the last 168 hours.  SpO2: 100 % O2 Flow Rate (L/min): 5 L/min FiO2 (%): 28 %    Vitals:   05/16/22 0747 05/16/22 0830 05/16/22 1135 05/16/22 1528  BP: (!) 154/92 (!) 154/92    Pulse: 76 70 76 75  Resp: '20 18 18 18  '$ Temp: 98.4 F (36.9 C)  TempSrc: Oral     SpO2: 100% 100% 99% 100%  Weight:      Height:          Data Reviewed:  Basic Metabolic Panel: Recent Labs  Lab 05/14/22 1556 05/15/22 0628 05/16/22 0506  NA 133* 136 132*  K 3.6 3.3* 3.2*  CL 100 94* 96*  CO2 '24 26 26  '$ GLUCOSE 118* 102* 104*  BUN 15 8 6*  CREATININE 0.52* 0.44* 0.47*  CALCIUM 8.9 9.4 9.2  MG  --  1.7  --     CBC: Recent Labs  Lab  05/14/22 1556 05/15/22 0628 05/16/22 0506  WBC 7.0 10.9* 8.1  NEUTROABS 5.6  --   --   HGB 5.7* 10.1* 9.6*  HCT 17.6* 28.4* 28.3*  MCV 98.9 91.0 92.5  PLT 263 262 262    LFT Recent Labs  Lab 05/14/22 1556 05/15/22 0628 05/16/22 0506  AST 13* 17 16  ALT '10 11 11  '$ ALKPHOS 27* 32* 27*  BILITOT 0.3 1.3* 0.9  PROT 6.3* 6.7 6.5  ALBUMIN 3.4* 3.7 3.3*     Antibiotics: Anti-infectives (From admission, onward)    None        DVT prophylaxis: SCDs  Code Status: Full code  Family Communication: No family at bedside   CONSULTS    Objective    Physical Examination:   General-appears in no acute distress Heart-S1-S2, regular, no murmur auscultated HEENT-tracheostomy in place Lungs-clear to auscultation bilaterally, no wheezing or crackles auscultated, G-tube in place Abdomen-soft, nontender, no organomegaly Extremities-no edema in the lower extremities Neuro-alert, oriented x3, no focal deficit noted   Status is: Inpatient:         [name]   [facility] If 7PM-7AM, please contact night-coverage at [email or web address], Office  [phone]   05/16/2022, 3:36 PM  LOS: 2 days

## 2022-05-16 NOTE — Progress Notes (Signed)
Patient was found by another nurse at sink washing inner cannula with soap and water with tissue rolled up in his stoma/trach. Respiratory called to change trach/cannula. Primary nurse notified.

## 2022-05-16 NOTE — Care Management Important Message (Signed)
Important Message  Patient Details  Name: [name] MRN: [id number] Date of Birth: [birth year]   Medicare Important Message Given:  Yes     Shelda Altes 05/16/2022, 10:53 AM

## 2022-05-16 NOTE — TOC Progression Note (Signed)
Transition of Care [facility]) - Progression Note    Patient Details  Name: [name] MRN: [id number] Date of Birth: 1951/04/06  Transition of Care [facility]) CM/SW Contact  Zenon Mayo, RN Phone Number: 05/16/2022, 5:18 PM  Clinical Narrative:     from home with family, has trach does own trach care, presents with anemai. TOC following.        Expected Discharge Plan and Services                                                 Social Determinants of Health (SDOH) Interventions    Readmission Risk Interventions     No data to display

## 2022-05-16 NOTE — Consult Note (Signed)
   [facility] Inpatient Consult   05/16/2022  [name] 03/04/1951 [id number]  [facility] Referral:  High risk list on [facility] banner  Primary Care Provider: [name], MD, is not currently a [facility] affiliated provider   Insurance:  [employer] Medicare SNP   Unfortunately, our [place] management team will not be able to assist as this patient is listed with Care Management in the [facility] SNP.    Plan: Will sign off. For additional questions or referrals please contact:    For questions, please call:  Natividad Brood, RN BSN [facility] Liaison  [phone] business mobile phone Toll free office [phone]  Fax number: [phone] [email or web address]

## 2022-05-17 DIAGNOSIS — C321 Malignant neoplasm of supraglottis: Secondary | ICD-10-CM | POA: Diagnosis not present

## 2022-05-17 DIAGNOSIS — K264 Chronic or unspecified duodenal ulcer with hemorrhage: Secondary | ICD-10-CM | POA: Diagnosis not present

## 2022-05-17 DIAGNOSIS — D62 Acute posthemorrhagic anemia: Secondary | ICD-10-CM | POA: Diagnosis not present

## 2022-05-17 DIAGNOSIS — Z931 Gastrostomy status: Secondary | ICD-10-CM | POA: Diagnosis not present

## 2022-05-17 LAB — CBC
HCT: 29.2 % — ABNORMAL LOW (ref 39.0–52.0)
Hemoglobin: 9.7 g/dL — ABNORMAL LOW (ref 13.0–17.0)
MCH: 31 pg (ref 26.0–34.0)
MCHC: 33.2 g/dL (ref 30.0–36.0)
MCV: 93.3 fL (ref 80.0–100.0)
Platelets: 246 10*3/uL (ref 150–400)
RBC: 3.13 MIL/uL — ABNORMAL LOW (ref 4.22–5.81)
RDW: 16.6 % — ABNORMAL HIGH (ref 11.5–15.5)
WBC: 7.3 10*3/uL (ref 4.0–10.5)
nRBC: 0 % (ref 0.0–0.2)

## 2022-05-17 LAB — PHOSPHORUS
Phosphorus: 2.8 mg/dL (ref 2.5–4.6)
Phosphorus: 2.6 mg/dL (ref 2.5–4.6)

## 2022-05-17 LAB — MAGNESIUM
Magnesium: 1.3 mg/dL — ABNORMAL LOW (ref 1.7–2.4)
Magnesium: 2.2 mg/dL (ref 1.7–2.4)

## 2022-05-17 LAB — BASIC METABOLIC PANEL
Anion gap: 9 (ref 5–15)
BUN: 6 mg/dL — ABNORMAL LOW (ref 8–23)
CO2: 26 mmol/L (ref 22–32)
Calcium: 9 mg/dL (ref 8.9–10.3)
Chloride: 95 mmol/L — ABNORMAL LOW (ref 98–111)
Creatinine, Ser: 0.52 mg/dL — ABNORMAL LOW (ref 0.61–1.24)
GFR, Estimated: >60 mL/min (ref >60)
Glucose, Bld: 104 mg/dL — ABNORMAL HIGH (ref 70–99)
Potassium: 3.4 mmol/L — ABNORMAL LOW (ref 3.5–5.1)
Sodium: 130 mmol/L — ABNORMAL LOW (ref 135–145)

## 2022-05-17 LAB — GLUCOSE, CAPILLARY: Glucose-Capillary: 101 mg/dL — ABNORMAL HIGH (ref 70–99)

## 2022-05-17 MED ORDER — FREE WATER
125.0000 mL | Freq: Four times a day (QID) | Status: DC
  Administered 2022-05-17 – 2022-05-19 (×8): 125 mL

## 2022-05-17 MED ORDER — ADULT MULTIVITAMIN W/MINERALS CH
1.0000 | ORAL_TABLET | Freq: Every day | ORAL | Status: DC
  Administered 2022-05-17 – 2022-05-19 (×3): 1
  Filled 2022-05-17 (×3): qty 1

## 2022-05-17 MED ORDER — KATE FARMS STANDARD 1.4 PO LIQD
325.0000 mL | Freq: Four times a day (QID) | ORAL | Status: DC
  Administered 2022-05-17 – 2022-05-19 (×8): 325 mL
  Filled 2022-05-17 (×11): qty 325

## 2022-05-17 MED ORDER — MAGNESIUM SULFATE 4 GM/100ML IV SOLN
4.0000 g | Freq: Once | INTRAVENOUS | Status: AC
  Administered 2022-05-17: 4 g via INTRAVENOUS
  Filled 2022-05-17: qty 100

## 2022-05-17 MED ORDER — FREE WATER
100.0000 mL | Freq: Four times a day (QID) | Status: DC
  Administered 2022-05-17 – 2022-05-19 (×8): 100 mL

## 2022-05-17 MED ORDER — POTASSIUM CHLORIDE 10 MEQ/100ML IV SOLN
10.0000 meq | Freq: Once | INTRAVENOUS | Status: AC
  Administered 2022-05-17: 10 meq via INTRAVENOUS
  Filled 2022-05-17: qty 100

## 2022-05-17 NOTE — Progress Notes (Signed)
Initial Nutrition Assessment  DOCUMENTATION CODES:   Underweight  INTERVENTION:   Tube Feeds via PEG: 1 carton [employer] 1.4 - QID 50 mL free water flush before and after each bolus + 125 mL free water flush q6h Provides 1820 kcal, 80 gm of protein, and 1836 mL total free water per day.  Multivitamin w/ minerals daily per tube  NUTRITION DIAGNOSIS:   Increased nutrient needs related to cancer and cancer related treatments as evidenced by estimated needs.  GOAL:   Patient will meet greater than or equal to 90% of their needs  MONITOR:   TF tolerance, Weight trends, I & O's, Labs  REASON FOR ASSESSMENT:   Consult Enteral/tube feeding initiation and management  ASSESSMENT:   71 y.o. male presented to the ED for evaluation of anemia. PMH includes SCC of supraglottic larynx (s/p XRT, dissection, and chemo), trach, PEG, chronic Hepatitis C, malnutrition, dysphagia, and HTN. Pt admitted with acute on chronic blood loss anemia, duodenal ulcer and G-tube feedings.   RD working remotely at time of assessment. Unable to reach pt.   Per EMR, pt has had a 15% weight loss within 5 months, this is clinically significant for time frame. Suspect that pt is malnourished based on weight loss, although unable to diagnosis at this time due to lack of physical exam.  Wt Readings from Last 15 Encounters:  05/16/22 41.7 kg  05/02/22 43.8 kg  05/01/22 45.4 kg  03/11/22 45.4 kg  03/07/22 46.7 kg  02/25/22 45.9 kg  02/18/22 45 kg  02/11/22 47.2 kg  02/04/22 45.6 kg  01/27/22 46.2 kg  01/13/22 46.6 kg  01/06/22 49.1 kg  12/17/21 47.1 kg  12/03/21 50.8 kg  11/13/21 46.3 kg   Medications reviewed and include: Amoxicillin, Levofloxacin, Protonix, Sucralfate Labs reviewed: Sodium 132, Potassium 3.2   NUTRITION - FOCUSED PHYSICAL EXAM:  Deferred to follow-up.  Diet Order:   Diet Order             Diet NPO time specified  Diet effective now                   EDUCATION NEEDS:    No education needs have been identified at this time  Skin:  Skin Assessment: Reviewed RN Assessment  Last BM:  8/31  Height:   Ht Readings from Last 1 Encounters:  05/14/22 '5\' 8"'$  (1.727 m)    Weight:   Wt Readings from Last 1 Encounters:  05/16/22 41.7 kg    Ideal Body Weight:  70 kg  BMI:  Body mass index is 13.99 kg/m.  Estimated Nutritional Needs:   Kcal:  1700-1900  Protein:  85-100 grams  Fluid:  >/= 1.7 L    Hermina Barters RD, LDN Clinical Dietitian See [facility] for contact information.

## 2022-05-17 NOTE — Progress Notes (Signed)
[facility]  PROGRESS NOTE  [name] BPZ:[id number] DOB: 08-Mar-1951 DOA: 05/14/2022 PCP: [name], MD   Brief HPI:   71 y.o. male with medical history significant for metastatic SCC of the supraglottic larynx (s/p primary XRT 07/06/2019, right neck dissection April 2021, then docetaxel 03/05/21-05/16/21 stopped after bowel perforation, carbo/taxol/pembro received until March 2023, cetuximab 01/07/22-02/25/22 with progression), s/p tracheostomy and PEG tube, anemia due to iron deficiency and chronic blood loss, duodenal ulcer, HTN, EtOH use who presented to the ED for evaluation of anemia. Patient was recently admitted at [facility] from 04/22/2022 to 04/27/2022 for anemia and melena.  EGD from 04/23/2022 unable to scope due to anatomy but visualized GI tract via G-tube.  Nonbleeding duodenal ulcer was seen, biopsy showed acute on chronic duodenitis.  He was started on empiric treatment for H. pylori with quad therapy including Pepto-Bismol, Flagyl, doxycycline, PPI.  He was given 2 units PRBC and 1 g iron dextran with hemoglobin stabilized to 8.3 on discharge. He was in the ED on 8/29 for PEG tube malfunction.  PEG tube was replaced with new 20 French PEG tube and placement was confirmed with x-ray with Gastrografin.  Patient had follow-up with oncology clinic at [facility] and plan to start [facility] however labs showed drop of hemoglobin to 6.0.  He was advised to come to the ED for further evaluation. Patient denies obvious bleeding CT abdomen/pelvis with contrast showed PEG tube in appropriate position in the upper gastric body.  No evidence of perforation.  2 units PRBC ordered [facility]  consulted.  While in the ED, patient's trach was exchanged to another #4 shiley flex uncuffed due to occlusion  Subjective   Patient seen and examined, tube feeds started this morning.  Hemoglobin has been stable.  Potassium is 3.4 this morning   Assessment/Plan:    Acute on chronic blood loss anemia -Duodenal  ulcer/iron deficiency -Patient's hemoglobin was 5.7 on admission with positive FOBT and melena -Recent admission at [facility] showed nonbleeding duodenal ulcer with acute on chronic duodenitis found on endoscopy with biopsy -[facility] consulted -Continue Protonix 40 mg IV every 12 hours -Started on Carafate 1 g p.o. every 6 hours  G-tube feeding -Patient is chronically PEG tube dependent -PEG tube was exchanged on 8/29 -Tube feeds on hold  Hypomagnesemia -Magnesium is 1.3 this morning -We will replace magnesium and follow mag level in a.m.  Hypokalemia -Potassium is 3.4 -We will replace potassium and follow BMP in am  Tracheostomy present -Trach was exchanged in the ED to another #4 Shiley flex uncuffed due to occlusion -Current trach care  Squamous cell carcinoma of right supraglottis -Status post XRT, previous chemotherapy with progression -Follows [place] oncology -There was plan to start [place] on 8/30 however sent to ED for worsening anemia  Hypertension -Blood pressure is stable  Alcohol abuse -Reports occasional alcohol use -No signs and symptoms of alcohol withdrawal at this time -Continue Ativan per CIWA protocol      Medications     amoxicillin  750 mg Per Tube TID   Chlorhexidine Gluconate Cloth  6 each Topical Daily   feeding supplement (KATE FARMS STANDARD 1.4)  325 mL Per Tube QID   free water  100 mL Per Tube QID   free water  125 mL Per Tube Q6H   levofloxacin  500 mg Per Tube Daily   multivitamin with minerals  1 tablet Per Tube Daily   pantoprazole (PROTONIX) IV  40 mg Intravenous Q12H   sodium  chloride flush  3 mL Intravenous Q12H   sucralfate  1 g Per Tube Q6H     Data Reviewed:   CBG:  No results for input(s): "GLUCAP" in the last 168 hours.  SpO2: 95 % O2 Flow Rate (L/min): 5 L/min FiO2 (%): 21 %    Vitals:   05/17/22 0407 05/17/22 0803 05/17/22 0842 05/17/22 1206  BP:  (!) 155/104    Pulse: 77 86 74   Resp: 18 18    Temp:   97.6 F (36.4 C)    TempSrc:  Oral    SpO2: 92% 97% 100% 95%  Weight:      Height:          Data Reviewed:  Basic Metabolic Panel: Recent Labs  Lab 05/14/22 1556 05/15/22 0628 05/16/22 0506 05/17/22 0848 05/17/22 1056  NA 133* 136 132* 130*  --   K 3.6 3.3* 3.2* 3.4*  --   CL 100 94* 96* 95*  --   CO2 '24 26 26 26  '$ --   GLUCOSE 118* 102* 104* 104*  --   BUN 15 8 6* 6*  --   CREATININE 0.52* 0.44* 0.47* 0.52*  --   CALCIUM 8.9 9.4 9.2 9.0  --   MG  --  1.7  --   --  1.3*  PHOS  --   --   --   --  2.8    CBC: Recent Labs  Lab 05/14/22 1556 05/15/22 0628 05/16/22 0506 05/17/22 0848  WBC 7.0 10.9* 8.1 7.3  NEUTROABS 5.6  --   --   --   HGB 5.7* 10.1* 9.6* 9.7*  HCT 17.6* 28.4* 28.3* 29.2*  MCV 98.9 91.0 92.5 93.3  PLT 263 262 262 246    LFT Recent Labs  Lab 05/14/22 1556 05/15/22 0628 05/16/22 0506  AST 13* 17 16  ALT '10 11 11  '$ ALKPHOS 27* 32* 27*  BILITOT 0.3 1.3* 0.9  PROT 6.3* 6.7 6.5  ALBUMIN 3.4* 3.7 3.3*     Antibiotics: Anti-infectives (From admission, onward)    Start     Dose/Rate Route Frequency Ordered Stop   05/16/22 2200  amoxicillin (AMOXIL) 250 MG/5ML suspension 750 mg        750 mg Per Tube 3 times daily 05/16/22 1606     05/16/22 1700  levofloxacin (LEVAQUIN) 25 MG/ML solution 500 mg        500 mg Per Tube Daily 05/16/22 1606          DVT prophylaxis: SCDs  Code Status: Full code  Family Communication: No family at bedside   CONSULTS    Objective    Physical Examination:   General-appears in no acute distress Heart-S1-S2, regular, no murmur auscultated Neck-tracheostomy in place Lungs-clear to auscultation bilaterally, no wheezing or crackles auscultated Abdomen-soft, nontender, no organomegaly Extremities-no edema in the lower extremities Neuro-alert, oriented x3, no focal deficit noted   Status is: Inpatient:         [name]   [facility] If 7PM-7AM, please contact night-coverage at  [email or web address], Office  [phone]   05/17/2022, 1:53 PM  LOS: 3 days

## 2022-05-18 DIAGNOSIS — C321 Malignant neoplasm of supraglottis: Secondary | ICD-10-CM | POA: Diagnosis not present

## 2022-05-18 DIAGNOSIS — Z931 Gastrostomy status: Secondary | ICD-10-CM | POA: Diagnosis not present

## 2022-05-18 DIAGNOSIS — D62 Acute posthemorrhagic anemia: Secondary | ICD-10-CM | POA: Diagnosis not present

## 2022-05-18 DIAGNOSIS — K264 Chronic or unspecified duodenal ulcer with hemorrhage: Secondary | ICD-10-CM | POA: Diagnosis not present

## 2022-05-18 LAB — BASIC METABOLIC PANEL
Anion gap: 8 (ref 5–15)
BUN: 6 mg/dL — ABNORMAL LOW (ref 8–23)
CO2: 27 mmol/L (ref 22–32)
Calcium: 8.6 mg/dL — ABNORMAL LOW (ref 8.9–10.3)
Chloride: 94 mmol/L — ABNORMAL LOW (ref 98–111)
Creatinine, Ser: 0.41 mg/dL — ABNORMAL LOW (ref 0.61–1.24)
GFR, Estimated: >60 mL/min (ref >60)
Glucose, Bld: 145 mg/dL — ABNORMAL HIGH (ref 70–99)
Potassium: 3.1 mmol/L — ABNORMAL LOW (ref 3.5–5.1)
Sodium: 129 mmol/L — ABNORMAL LOW (ref 135–145)

## 2022-05-18 LAB — PHOSPHORUS
Phosphorus: 2.7 mg/dL (ref 2.5–4.6)
Phosphorus: 2.7 mg/dL (ref 2.5–4.6)

## 2022-05-18 LAB — GLUCOSE, CAPILLARY
Glucose-Capillary: 117 mg/dL — ABNORMAL HIGH (ref 70–99)
Glucose-Capillary: 116 mg/dL — ABNORMAL HIGH (ref 70–99)
Glucose-Capillary: 109 mg/dL — ABNORMAL HIGH (ref 70–99)

## 2022-05-18 LAB — MAGNESIUM
Magnesium: 1.7 mg/dL (ref 1.7–2.4)
Magnesium: 1.9 mg/dL (ref 1.7–2.4)

## 2022-05-18 MED ORDER — LEVOFLOXACIN 500 MG PO TABS
500.0000 mg | ORAL_TABLET | Freq: Every day | ORAL | Status: DC
Start: 2022-05-18 — End: 2022-05-19
  Administered 2022-05-18 – 2022-05-19 (×2): 500 mg
  Filled 2022-05-18 (×2): qty 1

## 2022-05-18 MED ORDER — POTASSIUM CHLORIDE 10 MEQ/100ML IV SOLN
10.0000 meq | INTRAVENOUS | Status: AC
  Administered 2022-05-18 (×4): 10 meq via INTRAVENOUS
  Filled 2022-05-18 (×4): qty 100

## 2022-05-18 MED ORDER — MAGNESIUM SULFATE IN D5W 1-5 GM/100ML-% IV SOLN
1.0000 g | Freq: Once | INTRAVENOUS | Status: AC
  Administered 2022-05-18: 1 g via INTRAVENOUS
  Filled 2022-05-18: qty 100

## 2022-05-18 NOTE — Progress Notes (Signed)
[facility] ROUNDING NOTE   Subjective: No further GI bleeding per patient.  Vitals stable.  No CBC today, but hgb had been stable   Objective: Vital signs in last 24 hours: Temp:  [98.5 F (36.9 C)-98.8 F (37.1 C)] 98.5 F (36.9 C) (09/03 1054) Pulse Rate:  [70-96] 70 (09/03 0425) Resp:  [16-18] 16 (09/03 1054) BP: (123-149)/(71-106) 125/71 (09/03 1054) SpO2:  [97 %-100 %] 98 % (09/03 1234) FiO2 (%):  [21 %] 21 % (09/03 1234) Weight:  [42.8 kg] 42.8 kg (09/03 0500) Last BM Date : 05/15/22 General:  Alert, thin and chronically ill-appearing Heart:  Regular rate and rhythm; no murmurs Pulm:  CTAB.  No W/R/R. Abdomen:  Soft, non-distended.  BS present.  G-tube noted. Extremities:  Without edema.    Intake/Output from previous day: 09/02 0701 - 09/03 0700 In: 7.7 [I.V.:3; IV Piggyback:4.7] Out: 100 [Urine:100] Intake/Output this shift: No intake/output data recorded.   Lab Results: Recent Labs    05/16/22 0506 05/17/22 0848  WBC 8.1 7.3  HGB 9.6* 9.7*  PLT 262 246  MCV 92.5 93.3   BMET Recent Labs    05/16/22 0506 05/17/22 0848 05/18/22 0809  NA 132* 130* 129*  K 3.2* 3.4* 3.1*  CL 96* 95* 94*  CO2 '26 26 27  '$ GLUCOSE 104* 104* 145*  BUN 6* 6* 6*  CREATININE 0.47* 0.52* 0.41*  CALCIUM 9.2 9.0 8.6*   LFT Recent Labs    05/16/22 0506  PROT 6.5  ALBUMIN 3.3*  AST 16  ALT 11  ALKPHOS 27*  BILITOT 0.9   PT/INR No results for input(s): "INR" in the last 72 hours.    Imaging/Other results: No results found.    Assessment and Plan:  71 year old male with history of supraglottic SCC, status post radiation with high-grade pharyngeal stricture, admitted with recurrent upper GI bleed, presumably from large duodenal ulcer previous identified at [place] last month.  Repeat endoscopy this admission not performed due to limited endoscopic options (ulcer only accessible through gastrostomy site with neonatal stricture scope).  His hemoglobin  has remained stable without overt bleeding since admission.  He is being treated empirically for H. pylori given a previous history of H. pylori positive peptic ulcer disease in 2022.  Duodenal ulcer with hemorrhage - Appears resolved - Continue twice daily PPI IV while hospitalized, transition back to G-tube administration once outpatient - Would continue twice daily dosing for 8 weeks, then decrease to once daily - Continue Carafate suspension 4 times daily for 2 weeks - Complete empiric 14-day H. pylori treatment, first dose September 1 (Levaquin, amoxicillin, PPI) - We will set up outpatient follow-up with Dr. Havery Moros to consider testing for H. pylori eradication and to discuss possible endoscopic evaluation to assess resolution of ulcer (would need to be done in hospital using neonatal scope)  GI will sign off at this time   Daryel November, MD  05/18/2022, 12:54 PM [facility]

## 2022-05-18 NOTE — Progress Notes (Signed)
[facility]  PROGRESS NOTE  [name] RDE:[id number] DOB: 1951-06-07 DOA: 05/14/2022 PCP: [name], MD   Brief HPI:   71 y.o. male with medical history significant for metastatic SCC of the supraglottic larynx (s/p primary XRT 07/06/2019, right neck dissection April 2021, then docetaxel 03/05/21-05/16/21 stopped after bowel perforation, carbo/taxol/pembro received until March 2023, cetuximab 01/07/22-02/25/22 with progression), s/p tracheostomy and PEG tube, anemia due to iron deficiency and chronic blood loss, duodenal ulcer, HTN, EtOH use who presented to the ED for evaluation of anemia. Patient was recently admitted at [facility] from 04/22/2022 to 04/27/2022 for anemia and melena.  EGD from 04/23/2022 unable to scope due to anatomy but visualized GI tract via G-tube.  Nonbleeding duodenal ulcer was seen, biopsy showed acute on chronic duodenitis.  He was started on empiric treatment for H. pylori with quad therapy including Pepto-Bismol, Flagyl, doxycycline, PPI.  He was given 2 units PRBC and 1 g iron dextran with hemoglobin stabilized to 8.3 on discharge. He was in the ED on 8/29 for PEG tube malfunction.  PEG tube was replaced with new 20 French PEG tube and placement was confirmed with x-ray with Gastrografin.  Patient had follow-up with oncology clinic at [facility] and plan to start [facility] however labs showed drop of hemoglobin to 6.0.  He was advised to come to the ED for further evaluation. Patient denies obvious bleeding CT abdomen/pelvis with contrast showed PEG tube in appropriate position in the upper gastric body.  No evidence of perforation.  2 units PRBC ordered [facility]  consulted.  While in the ED, patient's trach was exchanged to another #4 shiley flex uncuffed due to occlusion  Subjective   Went into atrial fibrillation this morning.  Likely from electrolyte imbalance.  Started on tube feeding yesterday.  Potassium this morning is 3.1, magnesium 1.7.   Assessment/Plan:    Acute  on chronic blood loss anemia -Duodenal ulcer/iron deficiency -Patient's hemoglobin was 5.7 on admission with positive FOBT and melena -Recent admission at [facility] showed nonbleeding duodenal ulcer with acute on chronic duodenitis found on endoscopy with biopsy -[facility] consulted -Continue Protonix 40 mg IV every 12 hours; transition to G-tube administration for twice daily for 8 weeks then decrease to once daily -Started on Carafate 1 g p.o. every 6 hours; GI recommends to continue Carafate suspension 4 times daily for 2 weeks -Complete 14-day H. pylori treatment with Levaquin, PPI, first dose was September 1.  Atrial fibrillation -Patient developed A-fib this morning -Heart rate is controlled, likely from electrolyte imbalance -Not a candidate for anticoagulation due to recent GI bleed with duodenal ulcer hemorrhage -Will monitor on telemetry  G-tube feeding -Patient is chronically PEG tube dependent -PEG tube was exchanged on 8/29 -Tube feeds have been restarted  Hypomagnesemia -Magnesium  is 1.7 after replacement. -We will give additional magnesium sulfate 1 g IV x1  Hypokalemia -Potassium is 3.1 -We will give IV KCl 10 mEq x 4 -Follow BMP in am  Tracheostomy present -Trach was exchanged in the ED to another #4 Shiley flex uncuffed due to occlusion -Current trach care  Squamous cell carcinoma of right supraglottis -Status post XRT, previous chemotherapy with progression -Follows [facility] oncology -There was plan to start [place] on 8/30 however sent to ED for worsening anemia  Hypertension -Blood pressure is stable  Alcohol abuse -Reports occasional alcohol use -No signs and symptoms of alcohol withdrawal at this time -Continue Ativan per CIWA protocol      Medications  amoxicillin  750 mg Per Tube TID   Chlorhexidine Gluconate Cloth  6 each Topical Daily   feeding supplement (KATE FARMS STANDARD 1.4)  325 mL Per Tube QID   free water  100 mL Per Tube QID    free water  125 mL Per Tube Q6H   levofloxacin  500 mg Per Tube Daily   multivitamin with minerals  1 tablet Per Tube Daily   pantoprazole (PROTONIX) IV  40 mg Intravenous Q12H   sodium chloride flush  3 mL Intravenous Q12H   sucralfate  1 g Per Tube Q6H     Data Reviewed:   CBG:  Recent Labs  Lab 05/17/22 2013 05/18/22 0423  GLUCAP 101* 117*    SpO2: 98 % O2 Flow Rate (L/min): 5 L/min FiO2 (%): 21 %    Vitals:   05/18/22 0500 05/18/22 0834 05/18/22 1054 05/18/22 1234  BP:   125/71   Pulse:      Resp:   16   Temp:   98.5 F (36.9 C)   TempSrc:      SpO2:  99% 100% 98%  Weight: 42.8 kg     Height:          Data Reviewed:  Basic Metabolic Panel: Recent Labs  Lab 05/14/22 1556 05/15/22 0628 05/16/22 0506 05/17/22 0848 05/17/22 1056 05/17/22 2205 05/18/22 0500 05/18/22 0809  NA 133* 136 132* 130*  --   --   --  129*  K 3.6 3.3* 3.2* 3.4*  --   --   --  3.1*  CL 100 94* 96* 95*  --   --   --  94*  CO2 '24 26 26 26  '$ --   --   --  27  GLUCOSE 118* 102* 104* 104*  --   --   --  145*  BUN 15 8 6* 6*  --   --   --  6*  CREATININE 0.52* 0.44* 0.47* 0.52*  --   --   --  0.41*  CALCIUM 8.9 9.4 9.2 9.0  --   --   --  8.6*  MG  --  1.7  --   --  1.3* 2.2 1.7  --   PHOS  --   --   --   --  2.8 2.6 2.7  --     CBC: Recent Labs  Lab 05/14/22 1556 05/15/22 0628 05/16/22 0506 05/17/22 0848  WBC 7.0 10.9* 8.1 7.3  NEUTROABS 5.6  --   --   --   HGB 5.7* 10.1* 9.6* 9.7*  HCT 17.6* 28.4* 28.3* 29.2*  MCV 98.9 91.0 92.5 93.3  PLT 263 262 262 246    LFT Recent Labs  Lab 05/14/22 1556 05/15/22 0628 05/16/22 0506  AST 13* 17 16  ALT '10 11 11  '$ ALKPHOS 27* 32* 27*  BILITOT 0.3 1.3* 0.9  PROT 6.3* 6.7 6.5  ALBUMIN 3.4* 3.7 3.3*     Antibiotics: Anti-infectives (From admission, onward)    Start     Dose/Rate Route Frequency Ordered Stop   05/18/22 1000  levofloxacin (LEVAQUIN) tablet 500 mg        500 mg Per Tube Daily 05/18/22 0131     05/16/22  2200  amoxicillin (AMOXIL) 250 MG/5ML suspension 750 mg        750 mg Per Tube 3 times daily 05/16/22 1606     05/16/22 1700  levofloxacin (LEVAQUIN) 25 MG/ML solution 500 mg  Status:  Discontinued  500 mg Per Tube Daily 05/16/22 1606 05/18/22 0131        DVT prophylaxis: SCDs  Code Status: Full code  Family Communication: No family at bedside   CONSULTS    Objective    Physical Examination:   General-appears in no acute distress Heart-S1-S2, irregular, no murmur auscultated Lungs-clear to auscultation bilaterally, no wheezing or crackles auscultated Abdomen-soft, nontender, no organomegaly Extremities-no edema in the lower extremities Neuro-alert, oriented x3, no focal deficit noted   Status is: Inpatient:         [name]   [facility] If 7PM-7AM, please contact night-coverage at [email or web address], Office  [phone]   05/18/2022, 1:34 PM  LOS: 4 days

## 2022-05-19 DIAGNOSIS — Z931 Gastrostomy status: Secondary | ICD-10-CM | POA: Diagnosis not present

## 2022-05-19 DIAGNOSIS — K922 Gastrointestinal hemorrhage, unspecified: Secondary | ICD-10-CM | POA: Diagnosis not present

## 2022-05-19 DIAGNOSIS — K264 Chronic or unspecified duodenal ulcer with hemorrhage: Secondary | ICD-10-CM | POA: Diagnosis not present

## 2022-05-19 DIAGNOSIS — D62 Acute posthemorrhagic anemia: Secondary | ICD-10-CM | POA: Diagnosis not present

## 2022-05-19 LAB — BASIC METABOLIC PANEL
Anion gap: 7 (ref 5–15)
BUN: 8 mg/dL (ref 8–23)
CO2: 29 mmol/L (ref 22–32)
Calcium: 8.3 mg/dL — ABNORMAL LOW (ref 8.9–10.3)
Chloride: 93 mmol/L — ABNORMAL LOW (ref 98–111)
Creatinine, Ser: 0.39 mg/dL — ABNORMAL LOW (ref 0.61–1.24)
GFR, Estimated: >60 mL/min (ref >60)
Glucose, Bld: 125 mg/dL — ABNORMAL HIGH (ref 70–99)
Potassium: 3.9 mmol/L (ref 3.5–5.1)
Sodium: 129 mmol/L — ABNORMAL LOW (ref 135–145)

## 2022-05-19 LAB — GLUCOSE, CAPILLARY
Glucose-Capillary: 107 mg/dL — ABNORMAL HIGH (ref 70–99)
Glucose-Capillary: 109 mg/dL — ABNORMAL HIGH (ref 70–99)
Glucose-Capillary: 111 mg/dL — ABNORMAL HIGH (ref 70–99)
Glucose-Capillary: 121 mg/dL — ABNORMAL HIGH (ref 70–99)

## 2022-05-19 MED ORDER — HEPARIN SOD (PORK) LOCK FLUSH 100 UNIT/ML IV SOLN
500.0000 [IU] | INTRAVENOUS | Status: AC | PRN
  Administered 2022-05-19: 500 [IU]

## 2022-05-19 MED ORDER — AMOXICILLIN 250 MG/5ML PO SUSR
750.0000 mg | Freq: Three times a day (TID) | ORAL | 0 refills | Status: AC
Start: 2022-05-19 — End: 2022-05-29

## 2022-05-19 MED ORDER — PANTOPRAZOLE SODIUM 40 MG PO TBEC: 40.0000 mg | DELAYED_RELEASE_TABLET | Freq: Two times a day (BID) | ORAL | 6 refills | Status: AC

## 2022-05-19 MED ORDER — LEVOFLOXACIN 500 MG PO TABS: 500.0000 mg | ORAL_TABLET | Freq: Every day | ORAL | 0 refills | Status: AC

## 2022-05-19 MED ORDER — SUCRALFATE 1 GM/10ML PO SUSP: 1.0000 g | Freq: Four times a day (QID) | ORAL | 0 refills | Status: DC

## 2022-05-19 NOTE — Discharge Summary (Signed)
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 *  Hospital Course:  71 y.o. male with medical history significant for metastatic SCC of the supraglottic larynx (s/p primary XRT 07/06/2019, right neck dissection April 2021, then docetaxel 03/05/21-05/16/21 stopped after bowel perforation, carbo/taxol/pembro received until March 2023, cetuximab 01/07/22-02/25/22 with progression), s/p tracheostomy and PEG tube, anemia due to iron deficiency and chronic blood loss, duodenal ulcer, HTN, EtOH use who presented to the ED for evaluation of anemia. Patient was recently admitted at [facility] from 04/22/2022 to 04/27/2022 for anemia and melena.  EGD from 04/23/2022 unable to scope due to anatomy but visualized GI tract via G-tube.  Nonbleeding duodenal ulcer was seen, biopsy showed acute on chronic duodenitis.  He was started on empiric treatment for H. pylori with quad therapy including Pepto-Bismol, Flagyl, doxycycline, PPI.  He was given 2 units PRBC and 1 g iron dextran with hemoglobin stabilized to 8.3 on discharge. He was in the ED on 8/29 for PEG tube malfunction.  PEG tube was replaced with new 20 French PEG tube and placement was confirmed with x-ray with Gastrografin.  Patient had follow-up with oncology clinic at [facility] and plan to start [facility] however labs showed drop of hemoglobin to 6.0.  He was advised to come to the ED for further  evaluation. Patient denies obvious bleeding CT abdomen/pelvis with contrast showed PEG tube in appropriate position in the upper gastric body.  No evidence of perforation.  2 units PRBC ordered [facility]  consulted.   While in the ED, patient's trach was exchanged to another #4 shiley flex uncuffed due to occlusion  Assessment and Plan:   Acute on chronic blood loss anemia -Duodenal ulcer/iron deficiency -Patient's hemoglobin was 5.7 on admission with positive FOBT and melena -Recent admission at [facility] showed nonbleeding duodenal ulcer with acute on chronic duodenitis found on endoscopy with biopsy -[facility] consulted -Continue Protonix 40 mg IV every 12 hours; transition to G-tube administration for twice daily for 8 weeks then decrease to once daily -Started on Carafate 1 g p.o. every 6 hours; GI recommends to continue Carafate suspension 4 times daily for 2 weeks -Complete 14-day H. pylori treatment with Levaquin, PPI, first dose was September 1. -Follow-up gastroenterology in 4 weeks   Atrial fibrillation -Patient developed A-fib yesterday morning, converted back to normal sinus rhythm -Heart rate is controlled, likely from electrolyte imbalance -Not a candidate for anticoagulation due to recent GI bleed with duodenal ulcer hemorrhage    G-tube feeding -Patient is chronically PEG tube dependent -PEG tube was exchanged on 8/29 -Tube feeds have been restarted   Hypomagnesemia -Replete   Hypokalemia -Replete   Tracheostomy present -Trach was exchanged in the ED to another #4 Shiley flex uncuffed due to occlusion -Current trach care   Squamous cell carcinoma of right supraglottis -Status post XRT, previous chemotherapy with progression -Follows [place] oncology -There was  plan to start [place] on 8/30 however sent to ED for worsening anemia   Hypertension -Blood pressure is stable   Alcohol abuse -Reports occasional alcohol use -No signs and symptoms of alcohol  withdrawal at this time          Consultants: Gastroenterology Procedures performed: None Disposition: Home Diet recommendation:  Discharge Diet Orders (From admission, onward)     Start     Ordered   05/19/22 0000  Diet - low sodium heart healthy        05/19/22 1320           Patient on tube feeding  DISCHARGE MEDICATION: Allergies as of 05/19/2022   No Known Allergies      Medication List     STOP taking these medications    doxycycline 100 MG tablet Commonly known as: ADOXA   lansoprazole 30 MG capsule Commonly known as: PREVACID   metroNIDAZOLE 500 MG tablet Commonly known as: FLAGYL       TAKE these medications    amoxicillin 250 MG/5ML suspension Commonly known as: AMOXIL Place 15 mLs (750 mg total) into feeding tube 3 (three) times daily for 10 days.   atropine 1 % ophthalmic solution Place 1 drop into the right eye 2 (two) times a day.   clindamycin 1 % gel Commonly known as: Clindagel Apply topically 2 (two) times daily.   [employer] Stomach Relief 262 MG/15ML suspension Generic drug: bismuth subsalicylate Place 30 mLs into feeding tube every 4 (four) hours as needed for indigestion.   free water Soln Place 100 mLs into feeding tube every 4 (four) hours.   gabapentin 300 MG capsule Commonly known as: NEURONTIN Take 300 mg by mouth 3 (three) times daily.   [employer] [place] Standard 1.4 Liqd 487 mLs by Enteral route 3 (three) times daily. 1.5 cartons (487 ml) [employer] 1.4 TID with 60 ml free water before and 60 ml free water after each TF bolus. Provide an additional 120 ml free water TID.   Lenvima (10 MG Daily Dose) capsule Generic drug: lenvatinib 10 mg daily dose Take by mouth.   levofloxacin 500 MG tablet Commonly known as: LEVAQUIN Place 1 tablet (500 mg total) into feeding tube daily for 9 days. Start taking on: May 20, 2022   loperamide 2 MG capsule Commonly known as: IMODIUM Take by mouth.   losartan 50 MG  tablet Commonly known as: COZAAR Take 50 mg by mouth daily.   magnesium oxide 400 (240 Mg) MG tablet Commonly known as: MAG-OX Take 1 tablet (400 mg total) by mouth daily.   nystatin 100000 UNIT/ML suspension Commonly known as: MYCOSTATIN Take 5 mLs (500,000 Units total) by mouth 4 (four) times daily.   ondansetron 8 MG tablet Commonly known as: Zofran Take 1 tablet (8 mg total) by mouth 2 (two) times daily as needed for refractory nausea / vomiting. Starting on day 3 after chemo.   oxybutynin 5 MG tablet Commonly known as: DITROPAN SMARTSIG:1 Tablet(s) Gastro Tube Twice Daily PRN   oxyCODONE 5 MG immediate release tablet Commonly known as: Oxy IR/ROXICODONE Take 1 tablet (5 mg total) by mouth every 6 (six) hours as needed for severe pain.   pantoprazole 40 MG tablet Commonly known as: PROTONIX Take 1 tablet (40 mg total) by mouth 2 (two) times daily. Take Protonix 40 mg p.o. twice daily for 8 weeks till July 01, 2022 then changed to 40 mg once daily indefinitely What changed: additional instructions   prednisoLONE acetate 1 %  ophthalmic suspension Commonly known as: PRED FORTE Place 1 drop into the right eye 4 (four) times daily.   prochlorperazine 10 MG tablet Commonly known as: COMPAZINE Take 1 tablet (10 mg total) by mouth every 6 (six) hours as needed (Nausea or vomiting).   sucralfate 1 GM/10ML suspension Commonly known as: CARAFATE Place 10 mLs (1 g total) into feeding tube every 6 (six) hours for 9 days.   tiZANidine 4 MG tablet Commonly known as: ZANAFLEX Take 4 mg by mouth 2 (two) times daily as needed.   Vitamin D (Ergocalciferol) 1.25 MG (50000 UNIT) Caps capsule Commonly known as: DRISDOL Take 50,000 Units by mouth every 7 (seven) days.        Follow-up Information     [name], MD Follow up in 2 week(s).   Specialty: Internal Medicine Contact information: [address] [place] [address] [phone]         Yetta Flock, MD. Schedule an appointment as soon as possible for a visit in 4 week(s).   Specialty: Gastroenterology Contact information: [address] [place] [address] [phone]                Discharge Exam: Danley Danker Weights   05/16/22 0424 05/18/22 0500 05/19/22 0418  Weight: 41.7 kg 42.8 kg 43.8 kg   General-appears in no acute distress Heart-S1-S2, regular, no murmur auscultated Lungs-clear to auscultation bilaterally, no wheezing or crackles auscultated Abdomen-soft, nontender, no organomegaly Extremities-no edema in the lower extremities Neuro-alert, oriented x3, no focal deficit noted  Condition at discharge: good  The results of significant diagnostics from this hospitalization (including imaging, microbiology, ancillary and laboratory) are listed below for reference.   Imaging Studies: CT Abdomen Pelvis W Contrast  Result Date: 05/14/2022 CLINICAL DATA:  Concern for possible internal bleeding around percutaneous feeding tube. Clinical record notes history of throat cancer and laparotomy with repair of duodenal ulcer in July 2022. EXAM: CT ABDOMEN AND PELVIS WITH CONTRAST TECHNIQUE: Multidetector CT imaging of the abdomen and pelvis was performed using the standard protocol following bolus administration of intravenous contrast. RADIATION DOSE REDUCTION: This exam was performed according to the departmental dose-optimization program which includes automated exposure control, adjustment of the mA and/or kV according to patient size and/or use of iterative reconstruction technique. CONTRAST:  140m OMNIPAQUE IOHEXOL 300 MG/ML  SOLN COMPARISON:  CT abdomen pelvis 04/12/2021 FINDINGS: Lower chest: No acute abnormality. Hepatobiliary: No focal liver abnormality is seen. The gallbladder is distended measuring 4.5 cm transverse dimension (series 3, image 34). No gallbladder wall thickening, pericholecystic fat stranding, or gallstones. No bile duct dilatation. Pancreas:  Unremarkable. No pancreatic ductal dilatation or surrounding inflammatory changes. Spleen: Normal in size without focal abnormality. Adrenals/Urinary Tract: Adrenal glands are unremarkable. A few circumscribed hypodense lesions in the left kidney are consistent with benign cysts. No dedicated follow-up imaging is indicated. No hydronephrosis or perinephric fat stranding bilaterally. Bladder is unremarkable. Stomach/Bowel: Stomach is within normal limits. A percutaneous feeding tube is in appropriate position within the upper gastric body. Minimal remaining oral contrast within the stomach. A hyperdensity in the proximal duodenum is new compared to earlier same day abdominal radiograph and favored to represent the oral contrast as previously seen in the stomach. The oral contrast is seen throughout the normal caliber small bowel as well as throughout the colon and within the rectum. Appendix appears normal. No evidence of bowel wall thickening, distention, or inflammatory changes. Vascular/Lymphatic: Aortic atherosclerosis. No enlarged abdominal or pelvic lymph  nodes. Reproductive: Mild prostatomegaly. Other: No free intraperitoneal air, extraluminal oral contrast, or abdominopelvic ascites. Musculoskeletal: Transitional anatomy of L5. No acute or suspicious osseous finding. IMPRESSION: 1. Percutaneous gastrostomy tube is in appropriate position within the upper gastric body. Oral contrast is seen throughout the small bowel, colon and rectum. No free intraperitoneal air or extraluminal contrast to suggest perforation. Of note, evaluation for gastrointestinal bleeding is limited on this single phase exam with oral contrast. If there is further clinical concern for gastrointestinal bleeding, recommend GI bleeding protocol (triple phase) CT abdomen pelvis with intravenous contrast once the oral contrast has cleared. 2. Distended gallbladder without CT findings of cholecystitis. Electronically Signed   By: Ileana Roup  M.D.   On: 05/14/2022 19:12   DG ABDOMEN PEG TUBE LOCATION  Result Date: 05/14/2022 CLINICAL DATA:  Peg tube placement. EXAM: ABDOMEN - 1 VIEW COMPARISON:  Abdominal x-ray 05/13/2022 FINDINGS: Percutaneous gastrostomy tube is in the stomach. Contrast is seen within nondilated stomach. Bowel-gas pattern is nonobstructive. There are phleboliths in the pelvis. Oral contrast is seen throughout the colon. No acute fractures are identified. IMPRESSION: 1. Peg tube in the stomach. Electronically Signed   By: Ronney Asters M.D.   On: 05/14/2022 16:08   DG ABDOMEN PEG TUBE LOCATION  Result Date: 05/13/2022 CLINICAL DATA:  Check position gastrostomy tube EXAM: ABDOMEN - 1 VIEW COMPARISON:  09/30/2021 FINDINGS: Contrast injected through the gastrostomy tube is noted within the fundus of the stomach. There is no extravasation of contrast. Bowel gas pattern is nonspecific. IMPRESSION: Tip of gastrostomy tube is noted within the fundus of the stomach. Electronically Signed   By: Elmer Picker M.D.   On: 05/13/2022 14:48   DG Chest 2 View  Result Date: 05/01/2022 CLINICAL DATA:  Dyspnea EXAM: CHEST - 2 VIEW COMPARISON:  Radiographs 12/31/2021 FINDINGS: Left chest wall Port-A-Cath tip at the superior cavoatrial junction. Tracheostomy. No focal consolidation, pleural effusion, or pneumothorax. Normal cardiomediastinal silhouette. No acute osseous abnormality. IMPRESSION: No active cardiopulmonary disease. Electronically Signed   By: Placido Sou M.D.   On: 05/01/2022 17:59    Microbiology: Results for orders placed or performed in visit on 08/12/21  Resp panel by RT-PCR (RSV, Flu A&B, Covid) Nasopharyngeal Swab     Status: Abnormal   Collection Time: 08/12/21  3:31 PM   Specimen: Nasopharyngeal Swab; Nasopharyngeal(NP) swabs in vial transport medium  Result Value Ref Range Status   SARS Coronavirus 2 by RT PCR POSITIVE (A) NEGATIVE Final    Comment: RESULT CALLED TO, READ BACK BY AND VERIFIED  WITH: KERR, M. ON 08/13/2021 @ 0821 BY MECIAL J. (NOTE) SARS-CoV-2 target nucleic acids are DETECTED.  The SARS-CoV-2 RNA is generally detectable in upper respiratory specimens during the acute phase of infection. Positive results are indicative of the presence of the identified virus, but do not rule out bacterial infection or co-infection with other pathogens not detected by the test. Clinical correlation with patient history and other diagnostic information is necessary to determine patient infection status. The expected result is Negative.  Fact Sheet for Patients: EntrepreneurPulse.com.au  Fact Sheet for Healthcare Providers: IncredibleEmployment.be  This test is not yet approved or cleared by the [place] FDA and  has been authorized for detection and/or diagnosis of SARS-CoV-2 by FDA under an Emergency Use Authorization (EUA).  This EUA will remain in effect (meaning this te st can be used) for the duration of  the COVID-19 declaration under Section 564(b)(1) of the Act, 21 U.S.C. section 360bbb-3(b)(1),  unless the authorization is terminated or revoked sooner.     Influenza A by PCR NEGATIVE NEGATIVE Final   Influenza B by PCR NEGATIVE NEGATIVE Final    Comment: (NOTE) The Xpert Xpress SARS-CoV-2/FLU/RSV plus assay is intended as an aid in the diagnosis of influenza from Nasopharyngeal swab specimens and should not be used as a sole basis for treatment. Nasal washings and aspirates are unacceptable for Xpert Xpress SARS-CoV-2/FLU/RSV testing.  Fact Sheet for Patients: EntrepreneurPulse.com.au  Fact Sheet for Healthcare Providers: IncredibleEmployment.be  This test is not yet approved or cleared by the [place] FDA and has been authorized for detection and/or diagnosis of SARS-CoV-2 by FDA under an Emergency Use Authorization (EUA). This EUA will remain in effect (meaning this test can  be used) for the duration of the COVID-19 declaration under Section 564(b)(1) of the Act, 21 U.S.C. section 360bbb-3(b)(1), unless the authorization is terminated or revoked.     Resp Syncytial Virus by PCR NEGATIVE NEGATIVE Final    Comment: (NOTE) Fact Sheet for Patients: EntrepreneurPulse.com.au  Fact Sheet for Healthcare Providers: IncredibleEmployment.be  This test is not yet approved or cleared by the [place] FDA and has been authorized for detection and/or diagnosis of SARS-CoV-2 by FDA under an Emergency Use Authorization (EUA). This EUA will remain in effect (meaning this test can be used) for the duration of the COVID-19 declaration under Section 564(b)(1) of the Act, 21 U.S.C. section 360bbb-3(b)(1), unless the authorization is terminated or revoked.  Performed at [facility], [place] [address] [place], [place] [address]     Labs: CBC: Recent Labs  Lab 05/14/22 1556 05/15/22 0628 05/16/22 0506 05/17/22 0848  WBC 7.0 10.9* 8.1 7.3  NEUTROABS 5.6  --   --   --   HGB 5.7* 10.1* 9.6* 9.7*  HCT 17.6* 28.4* 28.3* 29.2*  MCV 98.9 91.0 92.5 93.3  PLT 263 262 262 154   Basic Metabolic Panel: Recent Labs  Lab 05/15/22 0628 05/16/22 0506 05/17/22 0848 05/17/22 1056 05/17/22 2205 05/18/22 0500 05/18/22 0809 05/18/22 1718 05/19/22 0214  NA 136 132* 130*  --   --   --  129*  --  129*  K 3.3* 3.2* 3.4*  --   --   --  3.1*  --  3.9  CL 94* 96* 95*  --   --   --  94*  --  93*  CO2 '26 26 26  '$ --   --   --  27  --  29  GLUCOSE 102* 104* 104*  --   --   --  145*  --  125*  BUN 8 6* 6*  --   --   --  6*  --  8  CREATININE 0.44* 0.47* 0.52*  --   --   --  0.41*  --  0.39*  CALCIUM 9.4 9.2 9.0  --   --   --  8.6*  --  8.3*  MG 1.7  --   --  1.3* 2.2 1.7  --  1.9  --   PHOS  --   --   --  2.8 2.6 2.7  --  2.7  --    Liver Function Tests: Recent Labs  Lab 05/14/22 1556 05/15/22 0628 05/16/22 0506  AST  13* 17 16  ALT '10 11 11  '$ ALKPHOS 27* 32* 27*  BILITOT 0.3 1.3* 0.9  PROT 6.3* 6.7 6.5  ALBUMIN 3.4* 3.7 3.3*   CBG: Recent Labs  Lab  05/18/22 1930 05/18/22 2358 05/19/22 0416 05/19/22 0732 05/19/22 1201  GLUCAP 109* 111* 109* 107* 121*    Discharge time spent: greater than 30 minutes.  Signed: [name], MD [facility] 05/19/2022

## 2022-05-20 ENCOUNTER — Telehealth: Payer: Self-pay

## 2022-05-20 ENCOUNTER — Other Ambulatory Visit: Payer: Self-pay | Admitting: Adult Health

## 2022-05-20 DIAGNOSIS — C321 Malignant neoplasm of supraglottis: Secondary | ICD-10-CM

## 2022-05-20 DIAGNOSIS — R21 Rash and other nonspecific skin eruption: Secondary | ICD-10-CM

## 2022-05-20 NOTE — Telephone Encounter (Signed)
-----   Message from Daryel November, MD sent at 05/18/2022  6:05 AM EDT ----- Regarding: hospital follow up [facility],  Can you get Mr. Herder set up for a routine office visit to follow up hospitalization for bleeding duodenal ulcer?  SA,  Mr. Crehan was admitted to [facility] in early August with UGIB.  Unable to intubate esophagus because of severe stricture.  They used a neonatal scope and went in through his gastrostomy where they saw a 3 cm duodenal ulcer with adherent clot.  Biopsied, no therapy.  Patient was discharged but then was readmitted to [facility] a couple of weeks later with black stools and acute drop in hgb.  We did not repeat endoscopy and his hgb was stable throughout his admission. He has a history of H. Pylori.  Biopsies at [facility] neg for H. Pylori but he was treated empirically with quad therapy which he stopped because of reported tongue swelling. We empirically started him on Levaquin/Amoxicillin.  Thanks,  [place]

## 2022-05-20 NOTE — Telephone Encounter (Signed)
Patient has already been scheduled for a follow up appt on Tuesday, 06/11/22 at 11 am with Alonza Bogus, PA-C.

## 2022-05-26 DIAGNOSIS — Z430 Encounter for attention to tracheostomy: Secondary | ICD-10-CM | POA: Diagnosis not present

## 2022-05-27 ENCOUNTER — Inpatient Hospital Stay: Payer: Medicare HMO | Attending: Hematology and Oncology | Admitting: Hematology and Oncology

## 2022-05-27 ENCOUNTER — Emergency Department (HOSPITAL_COMMUNITY): Payer: Medicare HMO

## 2022-05-27 ENCOUNTER — Other Ambulatory Visit: Payer: Self-pay

## 2022-05-27 ENCOUNTER — Emergency Department (HOSPITAL_COMMUNITY)
Admission: EM | Admit: 2022-05-27 | Discharge: 2022-05-27 | Disposition: A | Discharge status: Home / Self Care | Payer: Medicare HMO | Attending: Emergency Medicine | Admitting: Emergency Medicine

## 2022-05-27 ENCOUNTER — Encounter: Payer: Self-pay | Admitting: Hematology and Oncology

## 2022-05-27 ENCOUNTER — Telehealth: Payer: Self-pay | Admitting: *Deleted

## 2022-05-27 VITALS — BP 148/77 | HR 93 | Temp 98.1°F | Resp 18 | Ht 68.0 in | Wt 93.4 lb

## 2022-05-27 DIAGNOSIS — R0602 Shortness of breath: Secondary | ICD-10-CM | POA: Diagnosis not present

## 2022-05-27 DIAGNOSIS — J9509 Other tracheostomy complication: Secondary | ICD-10-CM | POA: Diagnosis not present

## 2022-05-27 DIAGNOSIS — Z8589 Personal history of malignant neoplasm of other organs and systems: Secondary | ICD-10-CM | POA: Insufficient documentation

## 2022-05-27 DIAGNOSIS — I10 Essential (primary) hypertension: Secondary | ICD-10-CM | POA: Insufficient documentation

## 2022-05-27 DIAGNOSIS — C321 Malignant neoplasm of supraglottis: Secondary | ICD-10-CM | POA: Diagnosis not present

## 2022-05-27 DIAGNOSIS — Z20822 Contact with and (suspected) exposure to covid-19: Secondary | ICD-10-CM | POA: Diagnosis not present

## 2022-05-27 DIAGNOSIS — J9503 Malfunction of tracheostomy stoma: Secondary | ICD-10-CM | POA: Insufficient documentation

## 2022-05-27 DIAGNOSIS — Z85841 Personal history of malignant neoplasm of brain: Secondary | ICD-10-CM | POA: Insufficient documentation

## 2022-05-27 DIAGNOSIS — Z79899 Other long term (current) drug therapy: Secondary | ICD-10-CM | POA: Diagnosis not present

## 2022-05-27 LAB — BASIC METABOLIC PANEL
Anion gap: 9 (ref 5–15)
BUN: 15 mg/dL (ref 8–23)
CO2: 30 mmol/L (ref 22–32)
Calcium: 9.5 mg/dL (ref 8.9–10.3)
Chloride: 96 mmol/L — ABNORMAL LOW (ref 98–111)
Creatinine, Ser: 0.51 mg/dL — ABNORMAL LOW (ref 0.61–1.24)
GFR, Estimated: >60 mL/min (ref >60)
Glucose, Bld: 112 mg/dL — ABNORMAL HIGH (ref 70–99)
Potassium: 3.9 mmol/L (ref 3.5–5.1)
Sodium: 135 mmol/L (ref 135–145)

## 2022-05-27 LAB — CBC
HCT: 33.6 % — ABNORMAL LOW (ref 39.0–52.0)
Hemoglobin: 11 g/dL — ABNORMAL LOW (ref 13.0–17.0)
MCH: 31.3 pg (ref 26.0–34.0)
MCHC: 32.7 g/dL (ref 30.0–36.0)
MCV: 95.5 fL (ref 80.0–100.0)
Platelets: 349 10*3/uL (ref 150–400)
RBC: 3.52 MIL/uL — ABNORMAL LOW (ref 4.22–5.81)
RDW: 15.2 % (ref 11.5–15.5)
WBC: 5.1 10*3/uL (ref 4.0–10.5)
nRBC: 0 % (ref 0.0–0.2)

## 2022-05-27 LAB — SARS CORONAVIRUS 2 BY RT PCR: SARS Coronavirus 2 by RT PCR: NEGATIVE

## 2022-05-27 MED ORDER — LOSARTAN POTASSIUM 25 MG PO TABS
50.0000 mg | ORAL_TABLET | ORAL | Status: AC
Start: 2022-05-27 — End: 2022-05-27
  Administered 2022-05-27: 50 mg
  Filled 2022-05-27: qty 2

## 2022-05-27 NOTE — Progress Notes (Signed)
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 SEE NOTE   04/01/2019 Surgery   Pre-Op Dx:   right supraglottic carcinoma   Post-op Dx: T2 N0 M0 (stage II) right supraglottic carcinoma   Proc: Direct laryngoscopy with biopsy.  Cervical esophagoscopy.  Bronchoscopy.   Surg:  [name]    Findings: A bulky necrotic and semi-pedunculated tumor coming off the lateral surface of the right aryepiglottic fold and the anterior and lateral aspect of the piriform sinus on the right side.  Airway was compromised by the tumor mass at intubation.   04/11/2019 Imaging   CT neck: IMPRESSION: The glottis is closed with suboptimal evaluation of the vocal cords. No asymmetry of the cords. Correlate with laryngoscopy results.   No enlarged lymph nodes in the neck. 7 mm right level 2 lymph node may be reactive. No definite pathologic lymph nodes in the neck.   04/11/2019 Imaging   CT neck  This CT was performed after surgical debulking of an exophytic laryngeal tumor.   Most apparent on series 2, image 51 there is abnormal asymmetric  hyperenhancement along the surface of the right piriform sinus, and perhaps also involving the bilateral AE folds.   04/22/2019 Imaging   PET: IMPRESSION: 1. Mild residual activity in the posterior RIGHT hypopharynx confined to the mucosa. 2. No evidence of hypermetabolic metastatic lymph nodes in LEFT or RIGHT neck. 3. No evidence distant metastatic disease.   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 New FDG avid right level 2 cervical lymph node concerning for recurrent disease. No findings of distant metastatic disease within the chest, abdomen or pelvis. 2. Mild nonspecific increased uptake in the right side of larynx is similar to previous exam. 3. Aortic Atherosclerosis (ICD10-I70.0). Coronary artery calcifications.    11/21/2019 Pathology Results   SURGICAL PATHOLOGY  CASE: [id number]  [name]  Surgical Pathology Report   FINAL MICROSCOPIC DIAGNOSIS:   A. LYMPH NODE, RIGHT CERVICAL:  - Poorly differentiated carcinoma consistent with metastatic squamous cell carcinoma.  - See comment   01/06/2020 Pathology Results   SURGICAL PATHOLOGY  CASE: [id number]  [name]  Surgical Pathology Report  Clinical History: metastatic cancer to cervical lymph nodes (cm)   FINAL MICROSCOPIC DIAGNOSIS:   A. LYMPH NODE, RIGHT MODIFIED, DISSECTION:  - Metastatic squamous cell carcinoma in four of nineteen lymph nodes (4/19).  - Largest metastatic nodule is 3.1 cm with 5 mm  extra-nodal extension.  - Lymphovascular space involvement in peri-nodal connective tissue.  - See comment.    01/06/2020 Surgery   PRE-OPERATIVE DIAGNOSIS:  METASTATIC CANCER TO CERVICAL LYMPH NODES   POST-OPERATIVE DIAGNOSIS:  METASTATIC CANCER TO CERVICAL LYMPH NODES   PROCEDURE:  Procedure(s): DIRECT LARYNGOSCOPY MODIFIED RADICAL NECK DISSECTION   SURGEON:  [name], MD     SPECIMEN: Right modified neck dissection including levels 2 and 3, including the internal jugular vein, suture marks the inferior jugular vein stump.   07/05/2020 Imaging   1. Sequelae of radiation and right neck dissection from the previous contrasted Neck CT.   2. Asymmetric soft tissue thickening and enhancement along the right lateral pharynx. Although this might be asymmetric mucositis, the appearance is suspicious and recommend direct inspection. NI-RADS category 2a.   3. Suspicious small 6 mm hyperenhancing nodular soft tissue along the posterior margin of the neck dissection at right level 3. NI-RADS category 2 vs 3 - although this is likely too small for imaging guided biopsy. Repeat PET-CT may be most valuable.   4. Mild inflammatory appearing right upper lobe centrilobular ground-glass opacity, new since February. Consider mild or developing right upper lobe infection. Post radiation changes to the lung apices.       08/13/2020 Pathology Results   A. OROPHARNYX, BIOPSY:  - Poorly differentiated squamous cell carcinoma.  - See comment.    08/13/2020 Surgery   POST-OPERATIVE DIAGNOSIS:  Tonsillary Mass History of Laryngeal  Cancer   PROCEDURE:  Procedure(s): DIRECT LARYNGOSCOPY BIOPSY OF OROPHARYNGEAL MASS   08/30/2020 PET scan   1. Recurrence of head neck carcinoma with a broad hypermetabolic pharyngeal mucosal lesion involving the RIGHT lateral posterior oropharynx and hypopharynx extending from the palatine tonsil to the vallecula. 2. Hypermetabolic lymph node posterior  sternocleidomastoid muscle on the RIGHT (level 3). 3. Hypermetabolic nodule within the LEFT parotid glands favored small primary parotid neoplasm. 4. No evidence thoracic metastasis   09/24/2020 - 10/30/2020 Chemotherapy   He received weekly cisplatin   01/31/2021 PET scan   Findings of worsening nodal disease with a node or group of nodes in the LEFT neck and a RIGHT supraclavicular lymph node as discussed.   Post treatment changes with soft tissue fullness in the area of the prior tumor but with markedly diminished metabolic activity.     03/11/2021 Imaging   Ct neck  Diffuse pharyngeal and laryngeal mucosal and submucosal edema probably related to acute radiation injury. The airway is narrow and could possibly be critically narrowed. I do not see any evidence of recurrent mucosal or submucosal tumor identifiable.   Similar appearance of the recurrent malignant lymphadenopathy on the left at level 2 and level 3 and on the right in the low supraclavicular region just above and behind the clavicular head.   04/12/2021 Imaging   Ct abdomen and pelvis  1. Contrast material in the peritoneal cavity of the upper abdomen suggesting leak along the gastrostomy tract. 2. Gastrostomy tube is appropriately positioned. No evidence of abscess. 3. 1.3 cm right middle lobe nodule, new since 01/31/2021. Favor infectious/inflammatory over primary/secondary neoplasm given the relatively short time frame of development.   04/12/2021 Surgery   Postoperative Diagnosis: Perforated duodenal ulcer (1st  portion)   Surgical Procedure:  Modified Graham patch of duodenal ulcer Gastrostomy tube exchange   Operative Team Members:  Surgeon(s) and Role:    * Stechschulte, Nickola Major, MD - Primary     Drains:  (19 Fr) Jackson-Pratt drain(s) with closed bulb suction in the right upper quadrant near the duodenal ulcer repair and Gastrostomy Tube 26 fr   05/09/2021 Imaging   1. Progression of lesion in the right tonsil  with central fluid collection and peripheral enhancement. This was a site of prior recurrent sick could represent progressive tumor or abscess. Continued follow-up recommended. 2. Progressive malignant lymphadenopathy in the left neck. Stable lymphadenopathy right supraclavicular region. 3. New area of stranding in the superior segment left lower lobe but certain etiology. Attention on follow-up recommended.     06/13/2021 - 11/13/2021 Chemotherapy   Patient is on Treatment Plan :  Paclitaxel + Carboplatin q7d     06/13/2021 - 11/13/2021 Chemotherapy   Patient is on Treatment Plan : HEAD/NECK Pembrolizumab Q21D     07/18/2021 Imaging   Ct chest 1. No highly specific findings identified to suggest pulmonary or nodal metastasis within the chest 2. Multifocal patchy areas of ground-glass and nodular airspace densities. Surrounding postinflammatory changes are noted with thickening of the peribronchovascular interstitium. Imaging findings are concerning for sequelae of aspiration and/or multifocal infection. Follow-up imaging after appropriate antibiotic therapy is recommended with repeat CT of the chest in 3 months to ensure resolution and to rule out the possibility of underlying metastatic nodule. 3. Coronary artery calcifications. 4. Aortic Atherosclerosis (ICD10-I70.0) and Emphysema (ICD10-J43.9).   07/18/2021 Imaging   CT neck  1. Positive treatment response with decreased size of nodal metastases in the left jugular chain. There is likely residual active tumor in the treated nodes. 2. Ulcerated lesions in the bilateral palatine fossa, progressed on the left and suspicious for synchronous tumors.   07/23/2021 Cancer Staging   Staging form: Larynx - Supraglottis, AJCC 8th Edition - Clinical stage from 07/23/2021: Stage IVA (rcT2, cN2b, cM0) - Signed by [name], MD on 07/23/2021 Stage prefix: Recurrence   09/19/2021 Imaging   IMPRESSION: 1. Positive treatment response at the cervical lymph  nodes. No residual nodal disease identified currently. 2. Persisting bilateral palatine tonsil lesion with mild size increase on the right.     09/19/2021 Imaging   IMPRESSION: No new significant lymphadenopathy seen in the mediastinum.   There is interval increase in infiltrates at multiple sites in both lungs as described in the body of the report. There is interval clearing of small patchy infiltrates in right lower lobe. Findings suggest possible waxing and waning multifocal pneumonia.   There are few subcentimeter nodules as described in the body of the report which may be part of pneumonia or neoplastic process. Short-term follow-up CT in 3 months may be considered. Coronary artery calcifications are seen. There is ectasia of ascending thoracic aorta.   01/07/2022 - 02/25/2022 Chemotherapy   Patient is on Treatment Plan : HEAD/NECK Cetuximab q7d      PRIOR THERAPY:  - 2020: Definitive radiation therapy to laryngeal tumor (completed). - Jan 2022- Feb 2022: Concurrent chemoradiation with weekly cisplatin (completed). - 03/05/21 - 05/16/21: Docetaxel '100mg'$ , '75mg'$  q21d x2 cycles (progression, perforated duodenal ulcer). - 06/13/21 - 12/03/21: Weekly carboplatin + paclitaxel x12 weeks + pembrolizumab q21d x6 cycle (progression of disease). -07/18/2021 CT neck chest: Positive treatment response with decreased size of nodal metastases in the left jugular chain. There is likely residual active tumor  in the treated nodes. Ulcerated lesions in the bilateral palatine fossa, progressed on the left and suspicious for synchronous tumors. No specific findings identified to suggest pulmonary or nodal metastasis 09/19/2021 CT neck chest: Positive treatment response at the cervical lymph nodes. No residual nodal disease identified currently.Persisting bilateral palatine tonsil lesion with mild size increase on the right. No new significant lymphadenopathy seen in the mediastinum. 01/07/2022 - 02/25/2022:  Cetuximab q7d 03/03/2022 CT neck chest-progressive bilateral palatine tonsil tumor especially on the left. Irregular groundglass opacity in the lung but no signs of metastatic disease. -He was seen at [facility] and the recommendation was to consider combination of lenvatinib and Keytruda.  Lenvatinib was suggested at a dose of 10 mg p.o. daily and Keytruda every 21 days.  He appears to have had a GI bleed since this combination and was briefly hospitalized.  He is now sent back to [facility] for ongoing treatment.  INTERVAL HISTORY:  [name] 71 y.o. male returns since his hospitalization.  Since his last visit, he was admitted again for GI bleed, needed blood transfusion and currently is on antibiotics for possible duodenitis.  He never had a chance to start lenvatinib or Keytruda.  He has a follow-up appointment with his oncologist on September 20.  He is today here with Ms. Verdene Lennert for the appointment.  He appears to have some difficulty with coarse breathing sounds and swelling of the left side of the face and neck concerning for tumor progression.  He otherwise denies any cough or shortness of breath.  He denies any other symptoms.  Once again he is not a good historian.  Rest of the pertinent 10 point ROS reviewed and negative. Patient Active Problem List   Diagnosis Date Noted   Duodenal ulcer with hemorrhage    Acute on chronic blood loss anemia 05/14/2022   Hypertension    Tracheostomy present ([place])    Alcohol use    Goals of care, counseling/discussion 10/11/2021   Pneumonitis 09/23/2021   Hypomagnesemia 08/05/2021   Dysphagia 07/24/2021   Neuropathy due to chemotherapeutic drug ([place]) 07/04/2021   Weight loss, unintentional 06/20/2021   Protein-calorie malnutrition, severe 04/17/2021   Perforated gastric ulcer ([place])    Gastrostomy tube dysfunction ([place]) 04/12/2021   Anemia due to antineoplastic chemotherapy 03/26/2021   Cancer related pain 03/26/2021    Laryngeal edema 03/11/2021   Nausea and vomiting 01/24/2021   Shortness of breath 01/10/2021   Mucositis due to antineoplastic therapy 01/10/2021   G tube feedings ([place]) 01/10/2021   Chemotherapy induced diarrhea 10/30/2020   Port-A-Cath in place 10/23/2020   Chronic hepatitis C without hepatic coma ([place]) 06/09/2019   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  MEDICAL HISTORY: Past Medical History:  Diagnosis Date   Cancer ([place])    Throat cancer 2019   ETOH abuse    Frequent urination    Glaucoma    Hepatitis C virus infection without hepatic coma    dx'ed in 11/2018   History of radiation therapy 05/12/19- 07/06/19   Larynx   Hypertension    Wears denture    upper only; lost lower denture    SURGICAL HISTORY: Past Surgical History:  Procedure Laterality Date   ANKLE SURGERY  2011   right ankle   COLONOSCOPY  02/2019   polyps - Dr Havery Moros   DIRECT LARYNGOSCOPY N/A 04/01/2019   Procedure: DIRECT LARYNGOSCOPY WITH BIOPSY;  Surgeon: Jodi Marble,  MD;  Location: [place];  Service: ENT;  Laterality: N/A;   DIRECT LARYNGOSCOPY N/A 01/06/2020   Procedure: DIRECT LARYNGOSCOPY;  Surgeon: Izora Gala, MD;  Location: [place];  Service: ENT;  Laterality: N/A;   DIRECT LARYNGOSCOPY N/A 08/13/2020   Procedure: DIRECT LARYNGOSCOPY;  Surgeon: Izora Gala, MD;  Location: [place];  Service: ENT;  Laterality: N/A;   ESOPHAGOSCOPY N/A 04/01/2019   Procedure: ESOPHAGOSCOPY;  Surgeon: Jodi Marble, MD;  Location: [place];  Service: ENT;  Laterality: N/A;   EXCISION ORAL TUMOR Right 08/13/2020   Procedure: BIOPSY OF OROPHARYNGEAL MASS;  Surgeon: Izora Gala, MD;  Location: [place];  Service: ENT;  Laterality: Right;   EYE SURGERY Right    IR CM INJ ANY COLONIC TUBE W/FLUORO  12/20/2020   IR CM INJ ANY COLONIC TUBE W/FLUORO  03/20/2021   IR GASTROSTOMY TUBE MOD SED  09/24/2020   IR IMAGING GUIDED PORT  INSERTION  09/24/2020   IR RADIOLOGIST EVAL & MGMT  07/02/2021   IR REPLC GASTRO/COLONIC TUBE PERCUT W/FLUORO  01/10/2022   KNEE SURGERY     LAPAROTOMY N/A 04/12/2021   Procedure: EXPLORATORY LAPAROTOMY, REPAIR OF DUODENAL ULCER WITH Silvestre Gunner;  Surgeon: Felicie Morn, MD;  Location: [facility];  Service: General;  Laterality: N/A;   LARYNGOSCOPY AND BRONCHOSCOPY N/A 04/01/2019   Procedure: BRONCHOSCOPY;  Surgeon: Jodi Marble, MD;  Location: [place];  Service: ENT;  Laterality: N/A;   RADICAL NECK DISSECTION N/A 01/06/2020   Procedure: RADICAL NECK DISSECTION;  Surgeon: Izora Gala, MD;  Location: [facility];  Service: ENT;  Laterality: N/A;    SOCIAL HISTORY: Social History   Socioeconomic History   Marital status: Single    Spouse name: Not on file   Number of children: 2   Years of education: Not on file   Highest education level: Not on file  Occupational History   Not on file  Tobacco Use   Smoking status: Former    Years: 50.00    Types: Cigarettes    Quit date: 06/01/2020    Years since quitting: 1.9   Smokeless tobacco: Never  Vaping Use   Vaping Use: Never used  Substance and Sexual Activity   Alcohol use: Yes    Alcohol/week: 4.0 standard drinks of alcohol    Types: 4 Cans of beer per week    Comment: 40oz beer daily   Drug use: Yes    Types: Cocaine    Comment: none in 2 yrs   Sexual activity: Yes    Partners: Female  Other Topics Concern   Not on file  Social History Narrative   Patient is divorced with 2 children.   Patient is currently living with his sister.   Patient with a history of smoking a third of pack of cigarettes daily for 50 years.  Patient currently smoking 2 to 3 cigarettes/day.   Patient has never used smokeless tobacco.   Patient with occasional use of alcohol.   Patient last used cocaine approximately 6 months ago.  Patient denies use of marijuana.   Social Determinants of Health   Financial Resource Strain: Not on file  Food  Insecurity: No Food Insecurity (09/21/2020)   Hunger Vital Sign    Worried About Running Out of Food in the Last Year: Never true    Ran Out of Food in the Last Year: Never true  Transportation Needs: Unmet Transportation Needs (03/05/2022)   PRAPARE - Transportation  Lack of Transportation (Medical): Not on file    Lack of Transportation (Non-Medical): Yes  Physical Activity: Not on file  Stress: Not on file  Social Connections: Unknown (09/21/2020)   Social Connection and Isolation Panel [NHANES]    Frequency of Communication with Friends and Family: Three times a week    Frequency of Social Gatherings with Friends and Family: More than three times a week    Attends Religious Services: Not on file    Active Member of Clubs or Organizations: Not on file    Attends Archivist Meetings: Not on file    Marital Status: Not on file  Intimate Partner Violence: Not At Risk (04/27/2019)   Humiliation, Afraid, Rape, and Kick questionnaire    Fear of Current or Ex-Partner: No    Emotionally Abused: No    Physically Abused: No    Sexually Abused: No    FAMILY HISTORY: Family History  Problem Relation Age of Onset   Breast cancer Sister    Colon cancer Brother 102       ????   Cancer Brother     Review of Systems  Constitutional:  Positive for fatigue. Negative for appetite change, chills, fever and unexpected weight change.  HENT:   Positive for trouble swallowing. Negative for hearing loss, lump/mass and sore throat.   Eyes:  Negative for eye problems and icterus.  Respiratory:  Negative for chest tightness, cough and shortness of breath.   Cardiovascular:  Negative for chest pain, leg swelling and palpitations.  Gastrointestinal:  Negative for abdominal distention, abdominal pain, constipation, diarrhea, nausea and vomiting.  Endocrine: Negative for hot flashes.  Genitourinary:  Negative for difficulty urinating.   Musculoskeletal:  Positive for neck pain. Negative for  arthralgias.  Skin:  Negative for itching and rash.  Neurological:  Negative for dizziness, extremity weakness, headaches and numbness.  Hematological:  Negative for adenopathy. Does not bruise/bleed easily.  Psychiatric/Behavioral:  Negative for depression. The patient is not nervous/anxious.       PHYSICAL EXAMINATION  ECOG PERFORMANCE STATUS: 1 - Symptomatic but completely ambulatory  Physical Exam Constitutional:      General: He is not in acute distress.    Appearance: Normal appearance. He is ill-appearing. He is not toxic-appearing.  HENT:     Head: Normocephalic and atraumatic.     Comments: Asymmetric swelling of the face and the neck on the left side Eyes:     General: No scleral icterus. Pulmonary:     Effort: Respiratory distress present.     Breath sounds: Normal breath sounds. No stridor.     Comments: His breathing was loud through the trach with no definitive stridor Musculoskeletal:        General: No swelling.     Cervical back: Neck supple.  Lymphadenopathy:     Cervical: Cervical adenopathy (Worsening cervical lymphadenopathy as well as swelling of the face on the left side.) present.  Skin:    General: Skin is warm and dry.     Findings: No rash.  Neurological:     General: No focal deficit present.     Mental Status: He is alert.  Psychiatric:        Mood and Affect: Mood normal.        Behavior: Behavior normal.     LABORATORY DATA:  CBC    Component Value Date/Time   WBC 7.3 05/17/2022 0848   RBC 3.13 (L) 05/17/2022 0848   HGB 9.7 (L) 05/17/2022 0848  HGB 10.2 (L) 11/01/2021 1157   HCT 29.2 (L) 05/17/2022 0848   PLT 246 05/17/2022 0848   PLT 222 11/01/2021 1157   MCV 93.3 05/17/2022 0848   MCH 31.0 05/17/2022 0848   MCHC 33.2 05/17/2022 0848   RDW 16.6 (H) 05/17/2022 0848   LYMPHSABS 0.9 05/14/2022 1556   MONOABS 0.4 05/14/2022 1556   EOSABS 0.0 05/14/2022 1556   BASOSABS 0.1 05/14/2022 1556    CMP     Component Value  Date/Time   NA 129 (L) 05/19/2022 0214   K 3.9 05/19/2022 0214   CL 93 (L) 05/19/2022 0214   CO2 29 05/19/2022 0214   GLUCOSE 125 (H) 05/19/2022 0214   BUN 8 05/19/2022 0214   CREATININE 0.39 (L) 05/19/2022 0214   CREATININE 0.54 (L) 11/01/2021 1157   CREATININE 0.61 (L) 10/26/2019 1523   CALCIUM 8.3 (L) 05/19/2022 0214   PROT 6.5 05/16/2022 0506   ALBUMIN 3.3 (L) 05/16/2022 0506   AST 16 05/16/2022 0506   AST 15 11/01/2021 1157   ALT 11 05/16/2022 0506   ALT 9 11/01/2021 1157   ALT 53 (H) 09/06/2019 1543   ALKPHOS 27 (L) 05/16/2022 0506   BILITOT 0.9 05/16/2022 0506   BILITOT 0.4 11/01/2021 1157   GFRNONAA >60 05/19/2022 0214   GFRNONAA >60 11/01/2021 1157   GFRAA >60 01/03/2020 1355   GFRAA >60 05/04/2019 1207    ASSESSMENT and THERAPY PLAN:  ALTIN SEASE is a 71 y.o. male who returns for a follow up for squamous cell carcinoma of the right supraglottis.  #SCC of right supraglottis:  Status post multiple lines of treatment as mentioned in the oncological history.  His last imaging back from June showed progressive bilateral palatine tonsil tumor especially on the left.  We have hence sent him back to week for consideration of clinical trials.  Apparently he did not qualify for any of the trials.  He was then [facility] to [facility] where they recommended considering combination of lenvatinib with Keytruda.  But before he had a chance to start these medications, he was admitted with GI bleed required blood transfusion and was just discharged.  He went back to the ER with some tongue swelling and lip swelling concerning for anaphylactic reaction and hence stopped the medications doxycycline, lansoprazole and metronidazole which were his new medications. When I last saw him, he was getting ready for infusion on August 30 for Keytruda and to pick up lenvatinib at [facility].  Since he last saw [place], he was readmitted to the hospital with a GI bleed, is now being treated for duodenitis with  antibiotics, his hemoglobin was 5.7 at the time of admission and required multiple units of blood.  Today his left side of the face and neck appear to be swollen, this is concerning for tumor progression.  He has not had any treatment in the past couple months.  He was also complaining of some respiratory distress hence we requested RT to come suction the trach but apparently this was unsuccessful and hence he will be sent to the emergency room for further evaluation, may need reinsertion of trachea.  Overall I am very concerned that this is tumor progression.  We have once again discussed today about goals of treatment.  He has been having recurrent episodes of GI bleed hence may not tolerate lenvatinib very well.  We have discussed that if he is not a candidate for treatment at [facility], he may have to consider palliative care.  We have discussed about goals of care, overall prognosis.    #Cancer related pain: Once again continues on oxycodone.  We have offered palliative care referral but he refused.  #Nutrition/Weight loss: --Currently giving 2 cans of [facility] via G-tube. -- Weight is stable.  Patient expressed understanding and satisfaction with the plan provided.   I have spent a total of 40 minutes minutes of face-to-face and non-face-to-face time, preparing to see the patient, performing a medically appropriate examination, counseling and educating the patient, ordering medications/tests,documenting clinical information in the electronic health record, and care coordination.   [name] MD

## 2022-05-27 NOTE — ED Provider Notes (Signed)
[facility] DEPT Provider Note   CSN: [id number] Arrival date & time: 05/27/22  1427     History  Chief Complaint  Patient presents with   Tracheostomy Tube Change    [name] is a 71 y.o. male.  HPI 71 year old male history of head neck cancer presents today with obstruction of his tracheostomy tube.  He was being seen at the oncology center.  He has had some problems with congestion and seem to have some obstruction of his tube.  Respiratory therapy saw and evaluated the patient prior to my evaluation.  She notes that patient does not have an inner cannula in and has a 4 oh Shiley.  This was placed by ENT at [place] Medications Prior to Admission medications   Medication Sig Start Date End Date Taking? Authorizing Provider  amoxicillin (AMOXIL) 250 MG/5ML suspension Place 15 mLs (750 mg total) into feeding tube 3 (three) times daily for 10 days. 05/19/22 05/29/22  [name], MD  atropine 1 % ophthalmic solution Place 1 drop into the right eye 2 (two) times a day.  Patient not taking: Reported on 05/16/2022    [provider]  clindamycin (CLINDAGEL) 1 % gel Apply topically 2 (two) times daily. Patient not taking: Reported on 05/16/2022 03/11/22   [name], NP  [employer] STOMACH RELIEF 525 MG/30ML suspension Place 30 mLs into feeding tube every 4 (four) hours as needed for indigestion. Patient not taking: Reported on 05/16/2022 04/27/22   [provider]  gabapentin (NEURONTIN) 300 MG capsule Take 300 mg by mouth 3 (three) times daily. Patient not taking: Reported on 05/16/2022 04/27/22   [provider]  LENVIMA, 10 MG DAILY DOSE, capsule Take by mouth. Patient not taking: Reported on 05/16/2022 04/15/22   [provider]  levofloxacin (LEVAQUIN) 500 MG tablet Place 1 tablet (500 mg total) into feeding tube daily for 9 days. 05/20/22 05/29/22  [name], MD  loperamide (IMODIUM) 2 MG capsule  Take by mouth. Patient not taking: Reported on 05/16/2022 04/22/22   [provider]  losartan (COZAAR) 50 MG tablet Take 50 mg by mouth daily. 09/19/21   [provider]  magnesium oxide (MAG-OX) 400 (240 Mg) MG tablet Take 1 tablet (400 mg total) by mouth daily. Patient not taking: Reported on 05/16/2022 08/05/21   [name], MD  Nutritional Supplements (KATE FARMS STANDARD 1.4) LIQD 487 mLs by Enteral route 3 (three) times daily. 1.5 cartons (487 ml) [employer] 1.4 TID with 60 ml free water before and 60 ml free water after each TF bolus. Provide an additional 120 ml free water TID. Patient not taking: Reported on 05/16/2022 04/22/21   [name], DO  nystatin (MYCOSTATIN) 100000 UNIT/ML suspension Take 5 mLs (500,000 Units total) by mouth 4 (four) times daily. Patient not taking: Reported on 05/16/2022 08/12/21   [name], PA-C  ondansetron (ZOFRAN) 8 MG tablet Take 1 tablet (8 mg total) by mouth 2 (two) times daily as needed for refractory nausea / vomiting. Starting on day 3 after chemo. Patient not taking: Reported on 05/16/2022 12/31/21   [name], MD  oxybutynin (DITROPAN) 5 MG tablet SMARTSIG:1 Tablet(s) Gastro Tube Twice Daily PRN Patient not taking: Reported on 05/16/2022 04/27/22   [provider]  oxyCODONE (OXY IR/ROXICODONE) 5 MG immediate release tablet Take 1 tablet (5 mg total) by mouth every 6 (six) hours as needed for severe pain. 05/06/22  [name], MD  pantoprazole (PROTONIX) 40 MG tablet Take 1 tablet (40 mg total) by mouth 2 (two) times daily. Take Protonix 40 mg p.o. twice daily for 8 weeks till July 01, 2022 then changed to 40 mg once daily indefinitely 05/19/22 03/09/23  [name], MD  prednisoLONE acetate (PRED FORTE) 1 % ophthalmic suspension Place 1 drop into the right eye 4 (four) times daily. Patient not taking: Reported on 05/16/2022    [provider]  prochlorperazine (COMPAZINE) 10 MG tablet Take 1 tablet (10  mg total) by mouth every 6 (six) hours as needed (Nausea or vomiting). Patient not taking: Reported on 05/16/2022 02/11/22   [name], MD  sucralfate (CARAFATE) 1 GM/10ML suspension Place 10 mLs (1 g total) into feeding tube every 6 (six) hours for 9 days. 05/19/22 05/28/22  [name], MD  tiZANidine (ZANAFLEX) 4 MG tablet Take 4 mg by mouth 2 (two) times daily as needed. Patient not taking: Reported on 05/16/2022 02/17/22   [provider]  Vitamin D, Ergocalciferol, (DRISDOL) 1.25 MG (50000 UNIT) CAPS capsule Take 50,000 Units by mouth every 7 (seven) days. Patient not taking: Reported on 05/16/2022 05/22/20   [provider]  Water For Irrigation, Sterile (FREE WATER) SOLN Place 100 mLs into feeding tube every 4 (four) hours. Patient not taking: Reported on 05/16/2022 04/22/21   [name], DO      Allergies    Patient has no known allergies.    Review of Systems   Review of Systems  Physical Exam Updated Vital Signs BP (!) 183/113   Pulse 91   Temp 97.6 F (36.4 C) (Oral)   Resp 18   SpO2 100%  Physical Exam Vitals reviewed.  Constitutional:      Appearance: Normal appearance.     Comments: Cachectic  HENT:     Head: Normocephalic and atraumatic.     Right Ear: External ear normal.     Left Ear: External ear normal.     Mouth/Throat:     Pharynx: Oropharynx is clear.  Eyes:     Pupils: Pupils are equal, round, and reactive to light.  Neck:     Comments: Tracheostomy tube in place with some thick mucus noted No surrounding erythema or signs of infection at tracheostomy site Cardiovascular:     Rate and Rhythm: Normal rate and regular rhythm.     Pulses: Normal pulses.  Pulmonary:     Effort: Pulmonary effort is normal.     Breath sounds: Normal breath sounds.  Abdominal:     General: Abdomen is flat. Bowel sounds are normal.  Musculoskeletal:        General: Normal range of motion.     Cervical back: Normal range of motion.  Skin:    General:  Skin is warm and dry.     Capillary Refill: Capillary refill takes less than 2 seconds.  Neurological:     General: No focal deficit present.     Mental Status: He is alert.  Psychiatric:        Mood and Affect: Mood normal.     ED Results / Procedures / Treatments   Labs (all labs ordered are listed, but only abnormal results are displayed) Labs Reviewed  CBC - Abnormal; Notable for the following components:      Result Value   RBC 3.52 (*)    Hemoglobin 11.0 (*)    HCT 33.6 (*)    All other components within normal  limits  BASIC METABOLIC PANEL - Abnormal; Notable for the following components:   Chloride 96 (*)    Glucose, Bld 112 (*)    Creatinine, Ser 0.51 (*)    All other components within normal limits  SARS CORONAVIRUS 2 BY RT PCR    EKG None  Radiology DG Chest Port 1 View  Result Date: 05/27/2022 CLINICAL DATA:  Shortness of breath. Possible tracheostomy dysfunction. EXAM: PORTABLE CHEST 1 VIEW COMPARISON:  Radiographs 05/01/2022 and 12/31/2021.  CT 03/03/2022. FINDINGS: 1507 hours. Two views obtained. The tracheostomy and left IJ Port-A-Cath are unchanged in position. Probable percutaneous G-tube, incompletely visualized. The heart size and mediastinal contours are stable. The lungs are clear. There is no pleural effusion or pneumothorax. No acute osseous findings are evident. IMPRESSION: Stable postoperative chest. No evidence of active cardiopulmonary process. Electronically Signed   By: Richardean Sale M.D.   On: 05/27/2022 15:27    Procedures Procedures    Medications Ordered in ED Medications  losartan (COZAAR) tablet 50 mg (50 mg Per Tube Given 05/27/22 1523)    ED Course/ Medical Decision Making/ A&P Clinical Course as of 05/27/22 1704  Tue May 27, 2022  1647 CBC reviewed interpreted and mild anemia but improved from prior COVID test reviewed interpreted negative [DR]  1647 Chest x-Adelynne Joerger reviewed interpreted no evidence of acute cardiopulmonary process  [DR]  9509 ASIC metabolic panel reviewed interpreted and within normal limits [DR]    Clinical Course User Index [DR] Pattricia Boss, MD                           Medical Decision Making 71 year old male presents today with tracheostomy partial obstruction Tracheostomy replaced here in the ED by pulmonary critical care Chest x-Lurena Naeve appears stable CBC reviewed and interpreted appears normal Chest x-Fatimata Talsma is clear Patient is hypertensive but did not take his medications today.  He was given a dose in the ED and blood pressure has decreased to 180/110. Plan patient to be discharged to home to follow-up with oncology   Amount and/or Complexity of Data Reviewed Labs: ordered. Decision-making details documented in ED Course. Radiology: ordered and independent interpretation performed. Decision-making details documented in ED Course.  Risk Prescription drug management.           Final Clinical Impression(s) / ED Diagnoses Final diagnoses:  Tracheostomy malfunction [facility])    Rx / DC Orders ED Discharge Orders     None         Pattricia Boss, MD 05/27/22 1704

## 2022-05-27 NOTE — Discharge Instructions (Addendum)
Please follow-up with your oncology doctors Please use inner cannula with tracheostomy Return if you are having any worsening problems

## 2022-05-27 NOTE — Telephone Encounter (Signed)
Per visit today with MD- requested evaluation by Respiratory for trach care due to noted changes in breathing - pt is not in respiratory distress.  Respiratory contacted and informed by [facility] she would be over as soon as able.  Dee arrived- trach care performed but due to noted catheter re insertion issues ( not advancing low enough ) Dee requested for pt to have trach catheter care evaluation and exchange in the ER.  Pt and family member present agreeable to the plan.  This RN contacted ER Charge nurse- report given and informed by Brianna to bring pt over to room 17.  Pt able to ambulate to wheelchair without difficulty- and this RN with family member present taken to the ER without incidence. Pt denied any respiratory distress but did have to clear throat/trach often by coughing and spitting into container provided.  Hand off given to ER Nurse with report of requested need.  No further needs by this RN - pt in bed with ER staff present providing care.  This RN returned to her unit.

## 2022-05-27 NOTE — ED Triage Notes (Signed)
Pt was sent over for a trach change. Pt states he was told by the cancer center that his trach was clogged.

## 2022-05-27 NOTE — Progress Notes (Signed)
Tracheostomy Exchange Procedure Note  [name]  [id number]  May 23, 1951  Date:05/27/22  Time:3:50 PM   Provider Performing:Kenta Laster A Ander Slade   Procedure: Tracheostomy Exchange Through Stoma   Indication(s) Clogged trach  Consent Verbal consent from patient  Anesthesia None   Time Out Verified patient identification, verified procedure, site/side was marked, verified correct patient position, special equipment/implants available, medications/allergies/relevant history reviewed, required imaging and test results available.   Sterile Technique Hand hygiene, gloves   Procedure Description Size 4 uncuffed existing Shiley removed and size 4 uncuffed Shiley placed through stoma.   Complications/Tolerance None; patient tolerated the procedure well..   EBL none

## 2022-05-27 NOTE — Progress Notes (Signed)
Called to [facility] to assess established trach. Upon arrival PT was not in respiratory distress, nor did he develop distress during visit. PT has a #4 Shiley Flexi (uncuffed). Per caregiver trach was placed in April 2023, by ENT [facility] Caregiver states that trach has never been changed. PT did not have inner cannula installed in trach when RT arrived. Discussed with PT and Caregiver the importance and purpose of utilizing inner cannula and HME devices that caregiver confirms they have at home. Trach shaft was full of hard, sticky mucus. RT worked for some time trying to get the trach shaft patent with no success (inner cannula would not fully fit into trach shaft). This RT had a second RT evaluate trach and inner cannula. Both RT's agree that PT needs to have trach replaced. PT was brought by [facility] staff to [facility] ED 17. This RT discussed with Dr. Jeanell Sparrow (PT just arrived and no MD assigned)- the situation with PT and that trach was originally placed by ENT (Throat Cancer)- also, that RT cannot change a trach that was placed by an ENT / Throat Cancer PTs. RN aware.

## 2022-05-28 ENCOUNTER — Telehealth: Payer: Self-pay | Admitting: *Deleted

## 2022-05-28 NOTE — Telephone Encounter (Signed)
        Patient  visited [facility] on 05/27/2022  for [place] blockage    Telephone encounter attempt :  1st  A HIPAA compliant voice message was left requesting a return call.  Instructed patient to call back at [phone].  [place] [phone] [address] , [place] [address] Email : [email or web address]

## 2022-05-29 ENCOUNTER — Telehealth: Payer: Self-pay | Admitting: *Deleted

## 2022-05-29 ENCOUNTER — Encounter: Payer: Self-pay | Admitting: Hematology and Oncology

## 2022-05-29 NOTE — Telephone Encounter (Signed)
     Patient  visit on 05/27/2022  at [facility] long  was for trach care   Have you been able to follow up with your primary care physician? Talked with caregiver she says patient is doing well has a virtual visit with the PCP on friday The patient was or was not able to obtain any needed medicine or equipment.  Are there diet recommendations that you are having difficulty following?  Patient expresses understanding of discharge instructions and education provided has no other needs at this time.   [name] [phone] [address] , [place] [address] Email : [email or web address]

## 2022-05-29 NOTE — Telephone Encounter (Signed)
No entry 

## 2022-05-30 DIAGNOSIS — K922 Gastrointestinal hemorrhage, unspecified: Secondary | ICD-10-CM | POA: Diagnosis not present

## 2022-05-30 DIAGNOSIS — Z930 Tracheostomy status: Secondary | ICD-10-CM | POA: Diagnosis not present

## 2022-05-30 DIAGNOSIS — C329 Malignant neoplasm of larynx, unspecified: Secondary | ICD-10-CM | POA: Diagnosis not present

## 2022-06-02 ENCOUNTER — Other Ambulatory Visit: Payer: Self-pay | Admitting: *Deleted

## 2022-06-03 ENCOUNTER — Telehealth: Payer: Self-pay

## 2022-06-03 DIAGNOSIS — R1310 Dysphagia, unspecified: Secondary | ICD-10-CM | POA: Diagnosis not present

## 2022-06-03 DIAGNOSIS — C770 Secondary and unspecified malignant neoplasm of lymph nodes of head, face and neck: Secondary | ICD-10-CM | POA: Diagnosis not present

## 2022-06-03 DIAGNOSIS — C329 Malignant neoplasm of larynx, unspecified: Secondary | ICD-10-CM | POA: Diagnosis not present

## 2022-06-03 DIAGNOSIS — C090 Malignant neoplasm of tonsillar fossa: Secondary | ICD-10-CM | POA: Diagnosis not present

## 2022-06-03 NOTE — Telephone Encounter (Signed)
        Patient  visited [facility] on 9/12    Telephone encounter attempt :  1st  A HIPAA compliant voice message was left requesting a return call.  Instructed patient to call back     [facility], [facility]  [phone] [address], [place], [place] [address] Phone: [phone] Email: [email or web address]

## 2022-06-03 NOTE — Telephone Encounter (Signed)
Palliative care consult placed to [facility].

## 2022-06-04 ENCOUNTER — Telehealth: Payer: Self-pay

## 2022-06-04 DIAGNOSIS — C109 Malignant neoplasm of oropharynx, unspecified: Secondary | ICD-10-CM | POA: Diagnosis not present

## 2022-06-04 DIAGNOSIS — C770 Secondary and unspecified malignant neoplasm of lymph nodes of head, face and neck: Secondary | ICD-10-CM | POA: Diagnosis not present

## 2022-06-04 DIAGNOSIS — D649 Anemia, unspecified: Secondary | ICD-10-CM | POA: Diagnosis not present

## 2022-06-04 DIAGNOSIS — D630 Anemia in neoplastic disease: Secondary | ICD-10-CM | POA: Diagnosis not present

## 2022-06-04 DIAGNOSIS — C321 Malignant neoplasm of supraglottis: Secondary | ICD-10-CM | POA: Diagnosis not present

## 2022-06-04 DIAGNOSIS — G893 Neoplasm related pain (acute) (chronic): Secondary | ICD-10-CM | POA: Diagnosis not present

## 2022-06-04 NOTE — Telephone Encounter (Signed)
Attempted to contact patient's sister [name] to schedule a Palliative Care consult appointment. No answer left a message to return call.

## 2022-06-09 ENCOUNTER — Other Ambulatory Visit: Payer: Self-pay | Admitting: Hematology and Oncology

## 2022-06-09 ENCOUNTER — Telehealth: Payer: Self-pay

## 2022-06-09 MED ORDER — OXYCODONE HCL 5 MG PO TABS: 5.0000 mg | ORAL_TABLET | Freq: Four times a day (QID) | ORAL | 0 refills | Status: AC | PRN

## 2022-06-09 NOTE — Progress Notes (Signed)
Refilled oxycodone per family's request He is going to establish with palliative care soon.  [name]

## 2022-06-09 NOTE — Telephone Encounter (Signed)
Spoke with patient's sister [name] and friend Verdene Lennert on a conference call and scheduled a telephonic Palliative Consult for 06/30/22 @ 2:30 PM per provider's availability.   Consent obtained; updated Nets[employer], Team List and Epic.

## 2022-06-10 ENCOUNTER — Emergency Department (HOSPITAL_COMMUNITY): Payer: Medicare HMO

## 2022-06-10 ENCOUNTER — Emergency Department (HOSPITAL_COMMUNITY)
Admission: EM | Admit: 2022-06-10 | Discharge: 2022-06-10 | Disposition: A | Discharge status: Home / Self Care | Payer: Medicare HMO | Attending: Emergency Medicine | Admitting: Emergency Medicine

## 2022-06-10 ENCOUNTER — Other Ambulatory Visit: Payer: Self-pay

## 2022-06-10 ENCOUNTER — Encounter (HOSPITAL_COMMUNITY): Payer: Self-pay

## 2022-06-10 DIAGNOSIS — J9500 Unspecified tracheostomy complication: Secondary | ICD-10-CM | POA: Diagnosis not present

## 2022-06-10 DIAGNOSIS — Z20822 Contact with and (suspected) exposure to covid-19: Secondary | ICD-10-CM | POA: Diagnosis not present

## 2022-06-10 DIAGNOSIS — Z85828 Personal history of other malignant neoplasm of skin: Secondary | ICD-10-CM | POA: Insufficient documentation

## 2022-06-10 DIAGNOSIS — I10 Essential (primary) hypertension: Secondary | ICD-10-CM | POA: Diagnosis not present

## 2022-06-10 DIAGNOSIS — R1084 Generalized abdominal pain: Secondary | ICD-10-CM | POA: Insufficient documentation

## 2022-06-10 DIAGNOSIS — J9509 Other tracheostomy complication: Secondary | ICD-10-CM | POA: Diagnosis not present

## 2022-06-10 DIAGNOSIS — Z79899 Other long term (current) drug therapy: Secondary | ICD-10-CM | POA: Diagnosis not present

## 2022-06-10 DIAGNOSIS — R0602 Shortness of breath: Secondary | ICD-10-CM | POA: Diagnosis not present

## 2022-06-10 DIAGNOSIS — R109 Unspecified abdominal pain: Secondary | ICD-10-CM | POA: Diagnosis not present

## 2022-06-10 DIAGNOSIS — R0689 Other abnormalities of breathing: Secondary | ICD-10-CM | POA: Diagnosis not present

## 2022-06-10 DIAGNOSIS — R0600 Dyspnea, unspecified: Secondary | ICD-10-CM | POA: Diagnosis not present

## 2022-06-10 LAB — CBC WITH DIFFERENTIAL/PLATELET
Abs Immature Granulocytes: 0.07 10*3/uL (ref 0.00–0.07)
Basophils Absolute: 0.1 10*3/uL (ref 0.0–0.1)
Basophils Relative: 1 %
Eosinophils Absolute: 0 10*3/uL (ref 0.0–0.5)
Eosinophils Relative: 0 %
HCT: 28.4 % — ABNORMAL LOW (ref 39.0–52.0)
Hemoglobin: 9.7 g/dL — ABNORMAL LOW (ref 13.0–17.0)
Immature Granulocytes: 1 %
Lymphocytes Relative: 9 %
Lymphs Abs: 0.9 10*3/uL (ref 0.7–4.0)
MCH: 31.2 pg (ref 26.0–34.0)
MCHC: 34.2 g/dL (ref 30.0–36.0)
MCV: 91.3 fL (ref 80.0–100.0)
Monocytes Absolute: 0.6 10*3/uL (ref 0.1–1.0)
Monocytes Relative: 6 %
Neutro Abs: 7.7 10*3/uL (ref 1.7–7.7)
Neutrophils Relative %: 83 %
Platelets: 313 10*3/uL (ref 150–400)
RBC: 3.11 MIL/uL — ABNORMAL LOW (ref 4.22–5.81)
RDW: 15.3 % (ref 11.5–15.5)
WBC: 9.3 10*3/uL (ref 4.0–10.5)
nRBC: 0 % (ref 0.0–0.2)

## 2022-06-10 LAB — COMPREHENSIVE METABOLIC PANEL
ALT: 11 U/L (ref 0–44)
AST: 17 U/L (ref 15–41)
Albumin: 3.9 g/dL (ref 3.5–5.0)
Alkaline Phosphatase: 51 U/L (ref 38–126)
Anion gap: 11 (ref 5–15)
BUN: 14 mg/dL (ref 8–23)
CO2: 25 mmol/L (ref 22–32)
Calcium: 9.2 mg/dL (ref 8.9–10.3)
Chloride: 94 mmol/L — ABNORMAL LOW (ref 98–111)
Creatinine, Ser: 0.39 mg/dL — ABNORMAL LOW (ref 0.61–1.24)
GFR, Estimated: >60 mL/min (ref >60)
Glucose, Bld: 123 mg/dL — ABNORMAL HIGH (ref 70–99)
Potassium: 3.6 mmol/L (ref 3.5–5.1)
Sodium: 130 mmol/L — ABNORMAL LOW (ref 135–145)
Total Bilirubin: 0.6 mg/dL (ref 0.3–1.2)
Total Protein: 7.7 g/dL (ref 6.5–8.1)

## 2022-06-10 LAB — URINALYSIS, ROUTINE W REFLEX MICROSCOPIC
Bilirubin Urine: NEGATIVE
Glucose, UA: NEGATIVE mg/dL
Hgb urine dipstick: NEGATIVE
Ketones, ur: 5 mg/dL — AB
Leukocytes,Ua: NEGATIVE
Nitrite: NEGATIVE
Protein, ur: NEGATIVE mg/dL
Specific Gravity, Urine: 1.01 (ref 1.005–1.030)
pH: 8 (ref 5.0–8.0)

## 2022-06-10 LAB — SARS CORONAVIRUS 2 BY RT PCR: SARS Coronavirus 2 by RT PCR: NEGATIVE

## 2022-06-10 LAB — BRAIN NATRIURETIC PEPTIDE: B Natriuretic Peptide: 117.8 pg/mL — ABNORMAL HIGH (ref 0.0–100.0)

## 2022-06-10 LAB — LIPASE, BLOOD: Lipase: 26 U/L (ref 11–51)

## 2022-06-10 MED ORDER — SODIUM CHLORIDE 0.9 % IV SOLN: INTRAVENOUS | Status: DC

## 2022-06-10 MED ORDER — HYDROMORPHONE HCL 1 MG/ML IJ SOLN
1.0000 mg | Freq: Once | INTRAMUSCULAR | Status: AC
  Administered 2022-06-10: 1 mg via INTRAVENOUS
  Filled 2022-06-10: qty 1

## 2022-06-10 MED ORDER — HEPARIN SOD (PORK) LOCK FLUSH 100 UNIT/ML IV SOLN
500.0000 [IU] | Freq: Once | INTRAVENOUS | Status: AC
  Administered 2022-06-10: 500 [IU]
  Filled 2022-06-10: qty 5

## 2022-06-10 MED ORDER — LABETALOL HCL 5 MG/ML IV SOLN
10.0000 mg | Freq: Once | INTRAVENOUS | Status: AC
  Administered 2022-06-10: 10 mg via INTRAVENOUS
  Filled 2022-06-10: qty 4

## 2022-06-10 MED ORDER — ONDANSETRON HCL 4 MG/2ML IJ SOLN
4.0000 mg | Freq: Once | INTRAMUSCULAR | Status: AC
  Administered 2022-06-10: 4 mg via INTRAVENOUS
  Filled 2022-06-10: qty 2

## 2022-06-10 MED ORDER — SODIUM CHLORIDE 0.9 % IV BOLUS
1000.0000 mL | Freq: Once | INTRAVENOUS | Status: AC
  Administered 2022-06-10: 1000 mL via INTRAVENOUS

## 2022-06-10 NOTE — Progress Notes (Signed)
Called to bedside by RN. Pt having trouble breathing. Suctioned pt x 2. Attempted to place inner cannula. Inner cannula became blocked with thick secretions. Pt still having trouble breathing. MD called to bedside to do complete trach change. Lurline Idol was changed without complication to another 4 cuffless shiley with inner cannula. Checked placement with EZcap positive color change. Explained to pt the importance of wearing an inner cannula at all times.Pt remains to 28% atc.

## 2022-06-10 NOTE — ED Triage Notes (Signed)
Pt arrived via EMS, c/o worsening SOB. Trach changed x2 wks ago.

## 2022-06-10 NOTE — ED Notes (Signed)
Patient's port de-accessed at this time.  Flushed with 10 mL NS and Heparin flush 5 mL per standing order.  Bandage and pressure applied to site.  No complaints voiced

## 2022-06-10 NOTE — ED Provider Notes (Signed)
[facility] DEPT Provider Note   CSN: [id number] Arrival date & time: 06/10/22  1529     History  Chief Complaint  Patient presents with   Shortness of Breath    [name] is a 71 y.o. male.   Shortness of Breath    Patient has a history of head and neck cancer, hypertension, hepatitis C status post tracheostomy and percutaneous gastrostomy tube who presents to the ED with complaints of nausea vomiting abdominal pain and difficulty breathing.  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  He has been treated with surgery and radiation.  Patient does also have history of duodenal ulcers.  Patient arrived by EMS.  States he started having symptoms today.  States he is having pain in his abdomen and is feeling nauseated.  He is also complaining of discomfort with his breathing.  Patient has tracheostomy in place.  He is coughing frequently.  He is having difficulty speaking in full sentences even with occluding his trach.  No known fevers.  [facility] records reviewed from [facility] system where he is seen.  Patient also was recently seen earlier this month for issues with respiratory difficulty.  Notes also indicate that he is getting referred to palliative care  Home Medications Prior to Admission medications   Medication Sig Start Date End Date Taking? Authorizing Provider  atropine 1 % ophthalmic solution Place 1 drop into the right eye 2 (two) times a day.  Patient not taking: Reported on 05/16/2022    [provider]  clindamycin (CLINDAGEL) 1 % gel Apply topically 2 (two) times daily. Patient not taking: Reported on 05/16/2022 03/11/22   [name], NP  [employer] STOMACH RELIEF 525 MG/30ML suspension Place 30 mLs into feeding tube every 4 (four) hours as needed for indigestion. Patient not taking: Reported on 05/16/2022 04/27/22   [provider]  gabapentin (NEURONTIN) 300 MG capsule Take 300 mg by  mouth 3 (three) times daily. Patient not taking: Reported on 05/16/2022 04/27/22   [provider]  LENVIMA, 10 MG DAILY DOSE, capsule Take by mouth. Patient not taking: Reported on 05/16/2022 04/15/22   [provider]  loperamide (IMODIUM) 2 MG capsule Take by mouth. Patient not taking: Reported on 05/16/2022 04/22/22   [provider]  losartan (COZAAR) 50 MG tablet Take 50 mg by mouth daily. 09/19/21   [provider]  magnesium oxide (MAG-OX) 400 (240 Mg) MG tablet Take 1 tablet (400 mg total) by mouth daily. Patient not taking: Reported on 05/16/2022 08/05/21   [name], MD  Nutritional Supplements (KATE FARMS STANDARD 1.4) LIQD 487 mLs by Enteral route 3 (three) times daily. 1.5 cartons (487 ml) [employer] 1.4 TID with 60 ml free water before and 60 ml free water after each TF bolus. Provide an additional 120 ml free water TID. Patient not taking: Reported on 05/16/2022 04/22/21   [name], DO  nystatin (MYCOSTATIN) 100000 UNIT/ML suspension Take 5 mLs (500,000 Units total) by mouth 4 (four) times daily. Patient not taking: Reported on 05/16/2022 08/12/21   [name], PA-C  ondansetron (ZOFRAN) 8 MG tablet Take 1 tablet (8 mg total) by mouth 2 (two) times daily as needed for refractory nausea / vomiting. Starting on day 3 after chemo. Patient not taking: Reported on 05/16/2022 12/31/21   [name], MD  oxybutynin (DITROPAN) 5 MG tablet SMARTSIG:1 Tablet(s) Gastro Tube Twice Daily PRN Patient not taking: Reported on 05/16/2022 04/27/22  [provider]  oxyCODONE (OXY IR/ROXICODONE) 5 MG immediate release tablet Take 1 tablet (5 mg total) by mouth every 6 (six) hours as needed for severe pain. 06/09/22   [name], MD  pantoprazole (PROTONIX) 40 MG tablet Take 1 tablet (40 mg total) by mouth 2 (two) times daily. Take Protonix 40 mg p.o. twice daily for 8 weeks till July 01, 2022 then changed to 40 mg once daily indefinitely 05/19/22  03/09/23  [name], MD  prednisoLONE acetate (PRED FORTE) 1 % ophthalmic suspension Place 1 drop into the right eye 4 (four) times daily. Patient not taking: Reported on 05/16/2022    [provider]  prochlorperazine (COMPAZINE) 10 MG tablet Take 1 tablet (10 mg total) by mouth every 6 (six) hours as needed (Nausea or vomiting). Patient not taking: Reported on 05/16/2022 02/11/22   [name], MD  sucralfate (CARAFATE) 1 GM/10ML suspension Place 10 mLs (1 g total) into feeding tube every 6 (six) hours for 9 days. 05/19/22 05/28/22  [name], MD  tiZANidine (ZANAFLEX) 4 MG tablet Take 4 mg by mouth 2 (two) times daily as needed. Patient not taking: Reported on 05/16/2022 02/17/22   [provider]  Vitamin D, Ergocalciferol, (DRISDOL) 1.25 MG (50000 UNIT) CAPS capsule Take 50,000 Units by mouth every 7 (seven) days. Patient not taking: Reported on 05/16/2022 05/22/20   [provider]  Water For Irrigation, Sterile (FREE WATER) SOLN Place 100 mLs into feeding tube every 4 (four) hours. Patient not taking: Reported on 05/16/2022 04/22/21   [name], DO      Allergies    Patient has no known allergies.    Review of Systems   Review of Systems  Respiratory:  Positive for shortness of breath.     Physical Exam Updated Vital Signs BP (!) 186/105   Pulse 65   Temp 98.5 F (36.9 C) (Oral)   Resp 20   SpO2 100%  Physical Exam Vitals and nursing note reviewed.  Constitutional:      General: He is in acute distress.     Appearance: He is well-developed. He is ill-appearing.  HENT:     Head: Normocephalic and atraumatic.     Comments: Edema noted posterior oropharynx    Right Ear: External ear normal.     Left Ear: External ear normal.  Eyes:     General: No scleral icterus.       Right eye: No discharge.        Left eye: No discharge.     Conjunctiva/sclera: Conjunctivae normal.  Neck:     Trachea: No tracheal deviation.  Cardiovascular:     Rate and  Rhythm: Normal rate and regular rhythm.  Pulmonary:     Effort: Pulmonary effort is normal. Tachypnea present. No respiratory distress.     Breath sounds: Normal breath sounds. No stridor. No decreased breath sounds, wheezing, rhonchi or rales.  Abdominal:     General: Bowel sounds are normal. There is no distension.     Palpations: Abdomen is soft.     Tenderness: There is generalized abdominal tenderness. There is no guarding or rebound.  Musculoskeletal:        General: No tenderness or deformity.     Cervical back: Neck supple.     Right lower leg: No edema.     Left lower leg: No edema.  Skin:    General: Skin is warm and dry.     Findings: No rash.  Neurological:     General: No focal deficit present.     Mental Status: He is alert.     Cranial Nerves: No cranial nerve deficit (no facial droop, extraocular movements intact, no slurred speech).     Sensory: No sensory deficit.     Motor: No abnormal muscle tone or seizure activity.     Coordination: Coordination normal.  Psychiatric:        Mood and Affect: Mood normal.     ED Results / Procedures / Treatments   Labs (all labs ordered are listed, but only abnormal results are displayed) Labs Reviewed  BRAIN NATRIURETIC PEPTIDE - Abnormal; Notable for the following components:      Result Value   B Natriuretic Peptide 117.8 (*)    All other components within normal limits  COMPREHENSIVE METABOLIC PANEL - Abnormal; Notable for the following components:   Sodium 130 (*)    Chloride 94 (*)    Glucose, Bld 123 (*)    Creatinine, Ser 0.39 (*)    All other components within normal limits  CBC WITH DIFFERENTIAL/PLATELET - Abnormal; Notable for the following components:   RBC 3.11 (*)    Hemoglobin 9.7 (*)    HCT 28.4 (*)    All other components within normal limits  URINALYSIS, ROUTINE W REFLEX MICROSCOPIC - Abnormal; Notable for the following components:   APPearance CLOUDY (*)    Ketones, ur 5 (*)    All other  components within normal limits  SARS CORONAVIRUS 2 BY RT PCR  LIPASE, BLOOD    EKG None  Radiology DG Abd Acute W/Chest  Result Date: 06/10/2022 CLINICAL DATA:  Abdominal pain with difficulty breathing. EXAM: DG ABDOMEN ACUTE WITH 1 VIEW CHEST COMPARISON:  Chest radiograph 05/27/2022 and CT AP 05/14/2022 FINDINGS: There is a left chest wall port a catheter with tip at the cavoatrial junction. The tracheostomy tube tip is above the carina. Heart size and mediastinal contours appear normal. No signs of pleural effusion or edema. No airspace opacities. Left upper quadrant percutaneous gastrostomy tube is noted. No dilated bowel loops identified. No abnormal abdominal or pelvic calcifications. IMPRESSION: 1. Nonobstructive bowel gas pattern. 2. No active cardiopulmonary abnormalities. Electronically Signed   By: Kerby Moors M.D.   On: 06/10/2022 17:15    Procedures TRACHEOSTOMY REPLACEMENT  Date/Time: 06/10/2022 6:29 PM  Performed by: Dorie Rank, MD Authorized by: Dorie Rank, MD  Consent: Verbal consent obtained. Risks and benefits: risks, benefits and alternatives were discussed Consent given by: patient Indications: obstruction Local anesthesia used: no  Anesthesia: Local anesthesia used: no  Sedation: Patient sedated: no  Tube cuff: cuffless Tube size: 4.0 mm Comments: Old tracheostomy tube removed.  New replaced in well established tract without difficulty, Shiley uncuffed 4       Medications Ordered in ED Medications  sodium chloride 0.9 % bolus 1,000 mL (0 mLs Intravenous Stopped 06/10/22 1914)    And  0.9 %  sodium chloride infusion ( Intravenous New Bag/Given 06/10/22 1701)  HYDROmorphone (DILAUDID) injection 1 mg (1 mg Intravenous Given 06/10/22 1630)  ondansetron (ZOFRAN) injection 4 mg (4 mg Intravenous Given 06/10/22 1630)  labetalol (NORMODYNE) injection 10 mg (10 mg Intravenous Given 06/10/22 1857)    ED Course/ Medical Decision Making/ A&P Clinical Course as  of 06/10/22 1955  Tue Jun 10, 2022  1715 CBC with Diff(!) Anemia similar to previous values. [JK]  1715 Comprehensive metabolic panel(!) Sodium level decreased but this has been noted on previous values [JK]  1828 Still having some breathing difficulty.  Appears that his tracheostomy is obstructed with debris.  Patient has not been using his inner cannula.  New tracheostomy tube exchange at the bedside.  Patient tolerated without difficulty.  Patient states he is breathing more easily now. [JK]  1841 SARS Coronavirus 2 by RT PCR (hospital order, performed in [facility] hospital lab) *cepheid single result test* Anterior Nasal Swab [JK]  1841 Negative [JK]  1841 Brain natriuretic peptide(!) BNP slightly increased but not consistent with acute CHF exacerbation [JK]  1841 Urinalysis, Routine w reflex microscopic Urine, Clean Catch(!) No UTI [JK]  1841 Lipase, blood Normal [JK]  1841 DG Abd Acute W/Chest Acute abdominal series without signs of obstruction.  No acute findings on chest x-ray [JK]  1942 SARS Coronavirus 2 by RT PCR (hospital order, performed in [facility] hospital lab) *cepheid single result test* Anterior Nasal Swab COVID-negative [JK]  1943 Patient's breathing is improved.  Patient noted to be hypertensive but has been given dose of blood pressure medications.  He feels comfortable going home [JK]    Clinical Course User Index [JK] Dorie Rank, MD                           Medical Decision Making Problems Addressed: Hypertension, unspecified type: acute illness or injury Tracheostomy complication, unspecified complication type [facility]): acute illness or injury that poses a threat to life or bodily functions  Amount and/or Complexity of Data Reviewed Labs: ordered. Decision-making details documented in ED Course. Radiology: ordered and independent interpretation performed. Decision-making details documented in ED Course.  Risk Prescription drug management. Drug therapy  requiring intensive monitoring for toxicity.   Patient was seen in the emergency room for evaluation of acute shortness of breath.  Patient also initially mention abdominal pain.  Has his breathing improved however he indicated his abdomen was not hurting any longer and it was primarily his breathing.  Patient did not show any evidence of pneumonia.  Laboratory test did not show findings suggestive of acute CHF exacerbation.  Regarding his abdominal pain no signs of hepatitis or pancreatitis.  No signs to suggest urinary tract infection.  Patient was noted to have significant respiratory difficulty.  His tracheostomy collar was suctioned however he continued to have difficulty.  RT noted that his cannula appeared to have some material obstructing the distal aspect.  Tracheostomy was removed and a new one was replaced.  Patient symptoms improved significantly.  Patient patient did not have his inner cannula initially.  When his tracheostomy was removed there was mucoid debris occluding the distal aspect.  Patient also noted to be hypertensive.  No signs of edema and I doubt endorgan ischemia associated with blood pressure.  Patient does have a history of hypertension but has not taken his meds.  We will give him a dose of blood pressure medications.  Evaluation and diagnostic testing in the emergency department does not suggest an emergent condition requiring admission or immediate intervention beyond what has been performed at this time.  The patient is safe for discharge and has been instructed to return immediately for worsening symptoms, change in symptoms or any other concerns.         Final Clinical Impression(s) / ED Diagnoses Final diagnoses:  Tracheostomy complication, unspecified complication type ([place])  Hypertension, unspecified type    Rx / DC Orders ED Discharge Orders     None         Tomi Bamberger,  Wille Glaser, MD 06/10/22 2671

## 2022-06-10 NOTE — Progress Notes (Signed)
RT CALLED TO BEDSIDE FOR TRACH PT SOB. RT SUCTION PT WITH NO COMPILATIONS. PT SATS 100% ON RA. MD AT BEDSIDE NO FURTHER ORDERS FOR RT AT THIS TIME.

## 2022-06-10 NOTE — Discharge Instructions (Addendum)
Make sure to keep the inner cannula in your tracheostomy tube.  You can remove it intermittently to clean but make sure you replace it.  Follow-up with your cancer doctor as planned.  Blood pressure was also noted to be elevated today.  You were given a dose of blood pressure medication but make sure to follow-up with primary care doctor to have that rechecked.

## 2022-06-10 NOTE — ED Provider Triage Note (Signed)
Emergency Medicine Provider Triage Evaluation Note  SKIPPER DACOSTA , a 71 y.o. male  was evaluated in triage.  Pt complains of shortness of breath per EMS. Has a trach due to head and neck cancer. Appears very uncomfortable with supplemental O2, endorses pain on exam/.   Review of Systems  Positive: sob Negative: Fever, cough  Physical Exam  There were no vitals taken for this visit. Gen:   Awake, in distress   Resp:  Normal effort  MSK:   Moves extremities without difficulty  Other:    Medical Decision Making  Medically screening exam initiated at 3:47 PM.  Appropriate orders placed.  Kasean E Poitra was informed that the remainder of the evaluation will be completed by another provider, this initial triage assessment does not replace that evaluation, and the importance of remaining in the ED until their evaluation is complete.  Cannot get pulse ox, trach place under palliative care NEEDS room, triage nurse notified.    Janeece Fitting, PA-C 06/10/22 1550

## 2022-06-11 ENCOUNTER — Ambulatory Visit: Payer: Medicare HMO | Admitting: Gastroenterology

## 2022-06-13 DIAGNOSIS — C321 Malignant neoplasm of supraglottis: Secondary | ICD-10-CM | POA: Diagnosis not present

## 2022-06-13 DIAGNOSIS — E871 Hypo-osmolality and hyponatremia: Secondary | ICD-10-CM | POA: Diagnosis not present

## 2022-06-13 DIAGNOSIS — Z931 Gastrostomy status: Secondary | ICD-10-CM | POA: Diagnosis not present

## 2022-06-13 DIAGNOSIS — G893 Neoplasm related pain (acute) (chronic): Secondary | ICD-10-CM | POA: Diagnosis not present

## 2022-06-13 DIAGNOSIS — C329 Malignant neoplasm of larynx, unspecified: Secondary | ICD-10-CM | POA: Diagnosis not present

## 2022-06-13 DIAGNOSIS — Z930 Tracheostomy status: Secondary | ICD-10-CM | POA: Diagnosis not present

## 2022-06-13 DIAGNOSIS — Z9221 Personal history of antineoplastic chemotherapy: Secondary | ICD-10-CM | POA: Diagnosis not present

## 2022-06-13 DIAGNOSIS — M542 Cervicalgia: Secondary | ICD-10-CM | POA: Diagnosis not present

## 2022-06-13 DIAGNOSIS — R110 Nausea: Secondary | ICD-10-CM | POA: Diagnosis not present

## 2022-06-18 ENCOUNTER — Inpatient Hospital Stay (HOSPITAL_COMMUNITY)
Admission: EM | Admit: 2022-06-18 | Discharge: 2022-06-21 | DRG: 177 | Disposition: A | Discharge status: Home Health | Payer: Medicare HMO | Attending: Internal Medicine | Admitting: Internal Medicine

## 2022-06-18 ENCOUNTER — Other Ambulatory Visit: Payer: Self-pay

## 2022-06-18 ENCOUNTER — Encounter (HOSPITAL_COMMUNITY): Payer: Self-pay | Admitting: Emergency Medicine

## 2022-06-18 ENCOUNTER — Emergency Department (HOSPITAL_COMMUNITY): Payer: Medicare HMO

## 2022-06-18 DIAGNOSIS — Z930 Tracheostomy status: Secondary | ICD-10-CM | POA: Diagnosis not present

## 2022-06-18 DIAGNOSIS — D638 Anemia in other chronic diseases classified elsewhere: Secondary | ICD-10-CM | POA: Diagnosis present

## 2022-06-18 DIAGNOSIS — Z681 Body mass index (BMI) 19 or less, adult: Secondary | ICD-10-CM

## 2022-06-18 DIAGNOSIS — Z87891 Personal history of nicotine dependence: Secondary | ICD-10-CM | POA: Diagnosis not present

## 2022-06-18 DIAGNOSIS — B1920 Unspecified viral hepatitis C without hepatic coma: Secondary | ICD-10-CM | POA: Diagnosis present

## 2022-06-18 DIAGNOSIS — Z8521 Personal history of malignant neoplasm of larynx: Secondary | ICD-10-CM | POA: Diagnosis not present

## 2022-06-18 DIAGNOSIS — Z79899 Other long term (current) drug therapy: Secondary | ICD-10-CM

## 2022-06-18 DIAGNOSIS — Z923 Personal history of irradiation: Secondary | ICD-10-CM

## 2022-06-18 DIAGNOSIS — K219 Gastro-esophageal reflux disease without esophagitis: Secondary | ICD-10-CM | POA: Diagnosis present

## 2022-06-18 DIAGNOSIS — R0689 Other abnormalities of breathing: Secondary | ICD-10-CM | POA: Diagnosis not present

## 2022-06-18 DIAGNOSIS — R000 Tachycardia, unspecified: Secondary | ICD-10-CM | POA: Diagnosis not present

## 2022-06-18 DIAGNOSIS — Z1152 Encounter for screening for COVID-19: Secondary | ICD-10-CM

## 2022-06-18 DIAGNOSIS — E43 Unspecified severe protein-calorie malnutrition: Secondary | ICD-10-CM | POA: Diagnosis present

## 2022-06-18 DIAGNOSIS — J690 Pneumonitis due to inhalation of food and vomit: Principal | ICD-10-CM | POA: Diagnosis present

## 2022-06-18 DIAGNOSIS — Z931 Gastrostomy status: Secondary | ICD-10-CM | POA: Diagnosis not present

## 2022-06-18 DIAGNOSIS — R918 Other nonspecific abnormal finding of lung field: Secondary | ICD-10-CM | POA: Diagnosis not present

## 2022-06-18 DIAGNOSIS — I10 Essential (primary) hypertension: Secondary | ICD-10-CM | POA: Diagnosis present

## 2022-06-18 DIAGNOSIS — R0602 Shortness of breath: Secondary | ICD-10-CM | POA: Diagnosis not present

## 2022-06-18 LAB — CBC WITH DIFFERENTIAL/PLATELET
Abs Immature Granulocytes: 0.01 10*3/uL (ref 0.00–0.07)
Basophils Absolute: 0 10*3/uL (ref 0.0–0.1)
Basophils Relative: 1 %
Eosinophils Absolute: 0 10*3/uL (ref 0.0–0.5)
Eosinophils Relative: 1 %
HCT: 28.9 % — ABNORMAL LOW (ref 39.0–52.0)
Hemoglobin: 9.5 g/dL — ABNORMAL LOW (ref 13.0–17.0)
Immature Granulocytes: 0 %
Lymphocytes Relative: 12 %
Lymphs Abs: 0.5 10*3/uL — ABNORMAL LOW (ref 0.7–4.0)
MCH: 30.7 pg (ref 26.0–34.0)
MCHC: 32.9 g/dL (ref 30.0–36.0)
MCV: 93.5 fL (ref 80.0–100.0)
Monocytes Absolute: 0.3 10*3/uL (ref 0.1–1.0)
Monocytes Relative: 9 %
Neutro Abs: 3.1 10*3/uL (ref 1.7–7.7)
Neutrophils Relative %: 77 %
Platelets: 306 10*3/uL (ref 150–400)
RBC: 3.09 MIL/uL — ABNORMAL LOW (ref 4.22–5.81)
RDW: 15.3 % (ref 11.5–15.5)
WBC: 4 10*3/uL (ref 4.0–10.5)
nRBC: 0 % (ref 0.0–0.2)

## 2022-06-18 MED ORDER — SODIUM CHLORIDE 0.9 % IV SOLN
3.0000 g | Freq: Four times a day (QID) | INTRAVENOUS | Status: DC
  Administered 2022-06-18 – 2022-06-21 (×11): 3 g via INTRAVENOUS
  Filled 2022-06-18 (×10): qty 8

## 2022-06-18 NOTE — ED Provider Notes (Signed)
[facility] DEPT Provider Note   CSN: [id number] Arrival date & time: 06/18/22  2118     History {Add pertinent medical, surgical, social history, OB history to HPI:1} Chief Complaint  Patient presents with   Tracheostomy Tube Change    [name] is a 71 y.o. male.  The history is provided by the patient and medical records.   71 y.o. M with hx of laryngeal cancer status post tracheostomy placement, presenting to the ED with sister for possible trach change.  States he has had a lot of congestion and increased secretions from his trach over the past few days.  Has not had any fever or vomiting.  Home Medications Prior to Admission medications   Medication Sig Start Date End Date Taking? Authorizing Provider  atropine 1 % ophthalmic solution Place 1 drop into the right eye 2 (two) times a day.  Patient not taking: Reported on 05/16/2022    [provider]  clindamycin (CLINDAGEL) 1 % gel Apply topically 2 (two) times daily. Patient not taking: Reported on 05/16/2022 03/11/22   [name], NP  [employer] STOMACH RELIEF 525 MG/30ML suspension Place 30 mLs into feeding tube every 4 (four) hours as needed for indigestion. Patient not taking: Reported on 05/16/2022 04/27/22   [provider]  gabapentin (NEURONTIN) 300 MG capsule Take 300 mg by mouth 3 (three) times daily. Patient not taking: Reported on 05/16/2022 04/27/22   [provider]  LENVIMA, 10 MG DAILY DOSE, capsule Take by mouth. Patient not taking: Reported on 05/16/2022 04/15/22   [provider]  loperamide (IMODIUM) 2 MG capsule Take by mouth. Patient not taking: Reported on 05/16/2022 04/22/22   [provider]  losartan (COZAAR) 50 MG tablet Take 50 mg by mouth daily. 09/19/21   [provider]  magnesium oxide (MAG-OX) 400 (240 Mg) MG tablet Take 1 tablet (400 mg total) by mouth daily. Patient not taking: Reported on 05/16/2022 08/05/21    [name], MD  Nutritional Supplements (KATE FARMS STANDARD 1.4) LIQD 487 mLs by Enteral route 3 (three) times daily. 1.5 cartons (487 ml) [employer] 1.4 TID with 60 ml free water before and 60 ml free water after each TF bolus. Provide an additional 120 ml free water TID. Patient not taking: Reported on 05/16/2022 04/22/21   [name], DO  nystatin (MYCOSTATIN) 100000 UNIT/ML suspension Take 5 mLs (500,000 Units total) by mouth 4 (four) times daily. Patient not taking: Reported on 05/16/2022 08/12/21   [name], PA-C  ondansetron (ZOFRAN) 8 MG tablet Take 1 tablet (8 mg total) by mouth 2 (two) times daily as needed for refractory nausea / vomiting. Starting on day 3 after chemo. Patient not taking: Reported on 05/16/2022 12/31/21   [name], MD  oxybutynin (DITROPAN) 5 MG tablet SMARTSIG:1 Tablet(s) Gastro Tube Twice Daily PRN Patient not taking: Reported on 05/16/2022 04/27/22   [provider]  oxyCODONE (OXY IR/ROXICODONE) 5 MG immediate release tablet Take 1 tablet (5 mg total) by mouth every 6 (six) hours as needed for severe pain. 06/09/22   [name], MD  pantoprazole (PROTONIX) 40 MG tablet Take 1 tablet (40 mg total) by mouth 2 (two) times daily. Take Protonix 40 mg p.o. twice daily for 8 weeks till July 01, 2022 then changed to 40 mg once daily indefinitely 05/19/22 03/09/23  [name], MD  prednisoLONE acetate (PRED FORTE) 1 % ophthalmic suspension Place 1 drop into the right eye 4 (  four) times daily. Patient not taking: Reported on 05/16/2022    [provider]  prochlorperazine (COMPAZINE) 10 MG tablet Take 1 tablet (10 mg total) by mouth every 6 (six) hours as needed (Nausea or vomiting). Patient not taking: Reported on 05/16/2022 02/11/22   [name], MD  sucralfate (CARAFATE) 1 GM/10ML suspension Place 10 mLs (1 g total) into feeding tube every 6 (six) hours for 9 days. 05/19/22 05/28/22  [name], MD  tiZANidine (ZANAFLEX) 4 MG tablet  Take 4 mg by mouth 2 (two) times daily as needed. Patient not taking: Reported on 05/16/2022 02/17/22   [provider]  Vitamin D, Ergocalciferol, (DRISDOL) 1.25 MG (50000 UNIT) CAPS capsule Take 50,000 Units by mouth every 7 (seven) days. Patient not taking: Reported on 05/16/2022 05/22/20   [provider]  Water For Irrigation, Sterile (FREE WATER) SOLN Place 100 mLs into feeding tube every 4 (four) hours. Patient not taking: Reported on 05/16/2022 04/22/21   [name], DO      Allergies    Patient has no known allergies.    Review of Systems   Review of Systems  HENT:         Trach change  All other systems reviewed and are negative.   Physical Exam Updated Vital Signs BP (!) 176/95   Pulse 96   Temp 98.9 F (37.2 C)   Resp (!) 24   SpO2 100%   Physical Exam Vitals and nursing note reviewed.  Constitutional:      Appearance: He is well-developed.  HENT:     Head: Normocephalic and atraumatic.     Mouth/Throat:     Comments: Trach in place, episode of coughing and lots of secretions expelled, white in color Eyes:     Conjunctiva/sclera: Conjunctivae normal.     Pupils: Pupils are equal, round, and reactive to light.  Cardiovascular:     Rate and Rhythm: Normal rate and regular rhythm.     Heart sounds: Normal heart sounds.  Pulmonary:     Effort: Pulmonary effort is normal.     Breath sounds: Normal breath sounds.  Abdominal:     General: Bowel sounds are normal.     Palpations: Abdomen is soft.  Musculoskeletal:        General: Normal range of motion.     Cervical back: Normal range of motion.  Skin:    General: Skin is warm and dry.  Neurological:     Mental Status: He is alert and oriented to person, place, and time.     ED Results / Procedures / Treatments   Labs (all labs ordered are listed, but only abnormal results are displayed) Labs Reviewed - No data to display  EKG None  Radiology No results found.  Procedures Procedures   {Document cardiac monitor, telemetry assessment procedure when appropriate:1}  Medications Ordered in ED Medications - No data to display  ED Course/ Medical Decision Making/ A&P                           Medical Decision Making  ***  {Document critical care time when appropriate:1} {Document review of labs and clinical decision tools ie heart score, Chads2Vasc2 etc:1}  {Document your independent review of radiology images, and any outside records:1} {Document your discussion with family members, caretakers, and with consultants:1} {Document social determinants of health affecting pt's care:1} {Document your decision making why or why not admission, treatments were needed:1}  Final Clinical Impression(s) / ED Diagnoses Final diagnoses:  None    Rx / DC Orders ED Discharge Orders     None

## 2022-06-18 NOTE — Progress Notes (Signed)
Trach change done per md rx.  Trach changed to #4 cuffless shiley trach which was the same size and type pt already had in place.  ETCO2 color change noted.  Pt hyper-oxygenated prior to procedure.  Pt tolerated procedure well without incident.  RN notified.

## 2022-06-18 NOTE — ED Notes (Signed)
RT at the bedside for trach care and suctioning

## 2022-06-18 NOTE — ED Triage Notes (Addendum)
Patient BIB [facility] from home c/o throat congestion for a few days per sister.  She states when this happens he needs a trach change.  146/80 120 HR 98% RA

## 2022-06-19 ENCOUNTER — Encounter (HOSPITAL_COMMUNITY): Payer: Self-pay | Admitting: Internal Medicine

## 2022-06-19 DIAGNOSIS — J690 Pneumonitis due to inhalation of food and vomit: Secondary | ICD-10-CM

## 2022-06-19 DIAGNOSIS — D638 Anemia in other chronic diseases classified elsewhere: Secondary | ICD-10-CM | POA: Diagnosis present

## 2022-06-19 DIAGNOSIS — Z681 Body mass index (BMI) 19 or less, adult: Secondary | ICD-10-CM | POA: Diagnosis not present

## 2022-06-19 DIAGNOSIS — I10 Essential (primary) hypertension: Secondary | ICD-10-CM | POA: Diagnosis present

## 2022-06-19 DIAGNOSIS — K219 Gastro-esophageal reflux disease without esophagitis: Secondary | ICD-10-CM | POA: Diagnosis present

## 2022-06-19 DIAGNOSIS — Z87891 Personal history of nicotine dependence: Secondary | ICD-10-CM | POA: Diagnosis not present

## 2022-06-19 DIAGNOSIS — Z8521 Personal history of malignant neoplasm of larynx: Secondary | ICD-10-CM | POA: Diagnosis not present

## 2022-06-19 DIAGNOSIS — E43 Unspecified severe protein-calorie malnutrition: Secondary | ICD-10-CM | POA: Diagnosis present

## 2022-06-19 DIAGNOSIS — Z1152 Encounter for screening for COVID-19: Secondary | ICD-10-CM | POA: Diagnosis not present

## 2022-06-19 DIAGNOSIS — Z79899 Other long term (current) drug therapy: Secondary | ICD-10-CM | POA: Diagnosis not present

## 2022-06-19 DIAGNOSIS — B1920 Unspecified viral hepatitis C without hepatic coma: Secondary | ICD-10-CM | POA: Diagnosis present

## 2022-06-19 DIAGNOSIS — Z930 Tracheostomy status: Secondary | ICD-10-CM | POA: Diagnosis not present

## 2022-06-19 DIAGNOSIS — Z923 Personal history of irradiation: Secondary | ICD-10-CM | POA: Diagnosis not present

## 2022-06-19 DIAGNOSIS — Z931 Gastrostomy status: Secondary | ICD-10-CM | POA: Diagnosis not present

## 2022-06-19 LAB — COMPREHENSIVE METABOLIC PANEL
ALT: 12 U/L (ref 0–44)
ALT: 13 U/L (ref 0–44)
AST: 17 U/L (ref 15–41)
AST: 18 U/L (ref 15–41)
Albumin: 3.6 g/dL (ref 3.5–5.0)
Albumin: 4 g/dL (ref 3.5–5.0)
Alkaline Phosphatase: 41 U/L (ref 38–126)
Alkaline Phosphatase: 46 U/L (ref 38–126)
Anion gap: 9 (ref 5–15)
Anion gap: 9 (ref 5–15)
BUN: 13 mg/dL (ref 8–23)
BUN: 17 mg/dL (ref 8–23)
CO2: 28 mmol/L (ref 22–32)
CO2: 30 mmol/L (ref 22–32)
Calcium: 9.1 mg/dL (ref 8.9–10.3)
Calcium: 9.2 mg/dL (ref 8.9–10.3)
Chloride: 93 mmol/L — ABNORMAL LOW (ref 98–111)
Chloride: 97 mmol/L — ABNORMAL LOW (ref 98–111)
Creatinine, Ser: 0.35 mg/dL — ABNORMAL LOW (ref 0.61–1.24)
Creatinine, Ser: 0.44 mg/dL — ABNORMAL LOW (ref 0.61–1.24)
GFR, Estimated: >60 mL/min (ref >60)
GFR, Estimated: >60 mL/min (ref >60)
Glucose, Bld: 113 mg/dL — ABNORMAL HIGH (ref 70–99)
Glucose, Bld: 116 mg/dL — ABNORMAL HIGH (ref 70–99)
Potassium: 3.6 mmol/L (ref 3.5–5.1)
Potassium: 3.6 mmol/L (ref 3.5–5.1)
Sodium: 132 mmol/L — ABNORMAL LOW (ref 135–145)
Sodium: 134 mmol/L — ABNORMAL LOW (ref 135–145)
Total Bilirubin: 0.4 mg/dL (ref 0.3–1.2)
Total Bilirubin: 0.4 mg/dL (ref 0.3–1.2)
Total Protein: 7 g/dL (ref 6.5–8.1)
Total Protein: 7.6 g/dL (ref 6.5–8.1)

## 2022-06-19 LAB — CBC WITH DIFFERENTIAL/PLATELET
Abs Immature Granulocytes: 0.03 10*3/uL (ref 0.00–0.07)
Basophils Absolute: 0 10*3/uL (ref 0.0–0.1)
Basophils Relative: 1 %
Eosinophils Absolute: 0 10*3/uL (ref 0.0–0.5)
Eosinophils Relative: 0 %
HCT: 26.6 % — ABNORMAL LOW (ref 39.0–52.0)
Hemoglobin: 8.9 g/dL — ABNORMAL LOW (ref 13.0–17.0)
Immature Granulocytes: 1 %
Lymphocytes Relative: 6 %
Lymphs Abs: 0.3 10*3/uL — ABNORMAL LOW (ref 0.7–4.0)
MCH: 30.8 pg (ref 26.0–34.0)
MCHC: 33.5 g/dL (ref 30.0–36.0)
MCV: 92 fL (ref 80.0–100.0)
Monocytes Absolute: 0.3 10*3/uL (ref 0.1–1.0)
Monocytes Relative: 6 %
Neutro Abs: 4.7 10*3/uL (ref 1.7–7.7)
Neutrophils Relative %: 86 %
Platelets: 217 10*3/uL (ref 150–400)
RBC: 2.89 MIL/uL — ABNORMAL LOW (ref 4.22–5.81)
RDW: 15.1 % (ref 11.5–15.5)
WBC: 5.4 10*3/uL (ref 4.0–10.5)
nRBC: 0 % (ref 0.0–0.2)

## 2022-06-19 LAB — PROTIME-INR
INR: 1.2 (ref 0.8–1.2)
Prothrombin Time: 14.7 seconds (ref 11.4–15.2)

## 2022-06-19 LAB — RESP PANEL BY RT-PCR (FLU A&B, COVID) ARPGX2
Influenza A by PCR: NEGATIVE
Influenza B by PCR: NEGATIVE
SARS Coronavirus 2 by RT PCR: NEGATIVE

## 2022-06-19 LAB — MAGNESIUM
Magnesium: 1.9 mg/dL (ref 1.7–2.4)
Magnesium: 1.5 mg/dL — ABNORMAL LOW (ref 1.7–2.4)

## 2022-06-19 LAB — LACTIC ACID, PLASMA: Lactic Acid, Venous: 1.2 mmol/L (ref 0.5–1.9)

## 2022-06-19 LAB — PROCALCITONIN: Procalcitonin: 0.38 ng/mL

## 2022-06-19 LAB — PHOSPHORUS: Phosphorus: 2.4 mg/dL — ABNORMAL LOW (ref 2.5–4.6)

## 2022-06-19 MED ORDER — FENTANYL CITRATE PF 50 MCG/ML IJ SOSY
50.0000 ug | PREFILLED_SYRINGE | INTRAMUSCULAR | Status: DC | PRN
  Administered 2022-06-19 – 2022-06-21 (×16): 50 ug via INTRAVENOUS
  Filled 2022-06-19 (×16): qty 1

## 2022-06-19 MED ORDER — ACETAMINOPHEN 650 MG RE SUPP: 650.0000 mg | Freq: Four times a day (QID) | RECTAL | Status: DC | PRN

## 2022-06-19 MED ORDER — ACETAMINOPHEN 325 MG PO TABS
650.0000 mg | ORAL_TABLET | Freq: Four times a day (QID) | ORAL | Status: DC | PRN
  Filled 2022-06-19: qty 2

## 2022-06-19 MED ORDER — MAGNESIUM SULFATE 2 GM/50ML IV SOLN
2.0000 g | Freq: Once | INTRAVENOUS | Status: AC
  Administered 2022-06-19: 2 g via INTRAVENOUS
  Filled 2022-06-19: qty 50

## 2022-06-19 MED ORDER — ACETAMINOPHEN 160 MG/5ML PO SOLN
650.0000 mg | Freq: Four times a day (QID) | ORAL | Status: DC | PRN
  Administered 2022-06-19 – 2022-06-21 (×8): 650 mg
  Filled 2022-06-19 (×8): qty 20.3

## 2022-06-19 MED ORDER — NALOXONE HCL 0.4 MG/ML IJ SOLN: 0.4000 mg | INTRAMUSCULAR | Status: DC | PRN

## 2022-06-19 MED ORDER — PANTOPRAZOLE SODIUM 40 MG IV SOLR
40.0000 mg | Freq: Two times a day (BID) | INTRAVENOUS | Status: DC
  Administered 2022-06-19 – 2022-06-21 (×6): 40 mg via INTRAVENOUS
  Filled 2022-06-19 (×6): qty 10

## 2022-06-19 MED ORDER — ONDANSETRON HCL 4 MG/2ML IJ SOLN
4.0000 mg | Freq: Four times a day (QID) | INTRAMUSCULAR | Status: DC | PRN
  Administered 2022-06-19 – 2022-06-20 (×2): 4 mg via INTRAVENOUS
  Filled 2022-06-19 (×2): qty 2

## 2022-06-19 MED ORDER — ACETAMINOPHEN 160 MG/5ML PO SOLN
650.0000 mg | Freq: Once | ORAL | Status: AC
  Administered 2022-06-19: 650 mg
  Filled 2022-06-19: qty 20.3

## 2022-06-19 NOTE — H&P (Signed)
History and Physical    PLEASE NOTE THAT DRAGON DICTATION SOFTWARE WAS USED IN THE CONSTRUCTION OF THIS NOTE.   [name] UDJ:[id number] DOB: 23-Jun-1951 DOA: 06/18/2022  PCP: [name], MD  Patient coming from: home   I have personally briefly reviewed patient's old medical records in [place]  Chief Complaint: Increasing secretions from tracheostomy  HPI: [name] is a 71 y.o. male with medical history significant for laryngeal cancer completed by chronic tracheostomy, essential pretension, GERD, anemia of chronic disease associated baseline hemoglobin range 8-11, who is admitted to [facility] on 06/18/2022 with suspected aspiration pneumonia after presenting from home to [facility] ED complaining of increased secretions from tracheostomy.   The following history is provided by the patient as well as the patient's sister, in addition to my discussions with the EDP and via chart review.  In the context of a history of laryngeal cancer , he has a chronic tracheostomy, associated with increased secretions over the last 3-4 days, prompting his presentation to the [facility] ED today. Sister conveys that , typically , when the patient has these increased secretions, that it is time to replace trach tube. Over the last 3-4 days , patient has also noted increased cough, and some mild new onset sob. Denies associated subjective fever, chills, rigors or gen myalgias. No recent vomiting. Of note, patient conveys that he takes all of his PO meds via his G-tube, as well as all of his nutrition via the G tube as well.   Of note at baseline, uses 5 L/min blow-by O2 via trach collar.   Denies any recent chest pain, palpitations, diaphoresis.      ED Course:  Vital signs in the ED were notable for the following:  AF; HR 83-95; SBP 130's - 160's; RR 16-24; 96-100 on 5L trach collar.   Labs were notable for the following: CMP notable for Cr 0.44, liver enzymes within normal limit. Mag  1.5. LA 1.2. CBC notable for wbc 4.0; Hgb 9.5. Covid/Flu negative.   Imaging and additional notable ED work-up: EKG shows SR w/ multiple pac's , HR 86; normal INT; and no e/o T wave or ST changes. CXR shows b/l lower lobe airspace opacities, concerning for aspiration pna.    While in the ED, the following were administered: Unasyn; Fentanyl 50 mcg iv x 1, liquid tylenol 650 mg via G tube x 1.   Subsequently, the patient was admitted  for further eval/management of suspected aspiration pna.     Review of Systems: As per HPI otherwise 10 point review of systems negative.   Past Medical History:  Diagnosis Date   Cancer ([place])    Throat cancer 2019   ETOH abuse    Frequent urination    Glaucoma    Hepatitis C virus infection without hepatic coma    dx'ed in 11/2018   History of radiation therapy 05/12/19- 07/06/19   Larynx   Hypertension    Wears denture    upper only; lost lower denture    Past Surgical History:  Procedure Laterality Date   ANKLE SURGERY  2011   right ankle   COLONOSCOPY  02/2019   polyps - Dr Havery Moros   DIRECT LARYNGOSCOPY N/A 04/01/2019   Procedure: DIRECT LARYNGOSCOPY WITH BIOPSY;  Surgeon: Jodi Marble, MD;  Location: [employer];  Service: ENT;  Laterality: N/A;   DIRECT LARYNGOSCOPY N/A 01/06/2020   Procedure: DIRECT LARYNGOSCOPY;  Surgeon: Izora Gala, MD;  Location: [place];  Service: ENT;  Laterality: N/A;   DIRECT LARYNGOSCOPY N/A 08/13/2020   Procedure: DIRECT LARYNGOSCOPY;  Surgeon: Izora Gala, MD;  Location: [place];  Service: ENT;  Laterality: N/A;   ESOPHAGOSCOPY N/A 04/01/2019   Procedure: ESOPHAGOSCOPY;  Surgeon: Jodi Marble, MD;  Location: [place];  Service: ENT;  Laterality: N/A;   EXCISION ORAL TUMOR Right 08/13/2020   Procedure: BIOPSY OF OROPHARYNGEAL MASS;  Surgeon: Izora Gala, MD;  Location: [place];  Service: ENT;  Laterality: Right;   EYE SURGERY Right    IR CM INJ ANY COLONIC TUBE W/FLUORO  12/20/2020    IR CM INJ ANY COLONIC TUBE W/FLUORO  03/20/2021   IR GASTROSTOMY TUBE MOD SED  09/24/2020   IR IMAGING GUIDED PORT INSERTION  09/24/2020   IR RADIOLOGIST EVAL & MGMT  07/02/2021   IR REPLC GASTRO/COLONIC TUBE PERCUT W/FLUORO  01/10/2022   KNEE SURGERY     LAPAROTOMY N/A 04/12/2021   Procedure: EXPLORATORY LAPAROTOMY, REPAIR OF DUODENAL ULCER WITH Silvestre Gunner;  Surgeon: Stechschulte, Nickola Major, MD;  Location: [facility];  Service: General;  Laterality: N/A;   LARYNGOSCOPY AND BRONCHOSCOPY N/A 04/01/2019   Procedure: BRONCHOSCOPY;  Surgeon: Jodi Marble, MD;  Location: [place];  Service: ENT;  Laterality: N/A;   RADICAL NECK DISSECTION N/A 01/06/2020   Procedure: RADICAL NECK DISSECTION;  Surgeon: Izora Gala, MD;  Location: [place];  Service: ENT;  Laterality: N/A;    Social History:  reports that he quit smoking about 2 years ago. His smoking use included cigarettes. He has never used smokeless tobacco. He reports current alcohol use of about 4.0 standard drinks of alcohol per week. He reports current drug use. Drug: Cocaine.   No Known Allergies  Family History  Problem Relation Age of Onset   Breast cancer Sister    Colon cancer Brother 50       ????   Cancer Brother       Prior to Admission medications   Medication Sig Start Date End Date Taking? Authorizing Provider  losartan (COZAAR) 50 MG tablet Take 50 mg by mouth daily. 09/19/21  Yes [provider]  magnesium oxide (MAG-OX) 400 MG tablet Take 1 tablet by mouth daily. 05/20/22  Yes [provider]  oxyCODONE (OXY IR/ROXICODONE) 5 MG immediate release tablet Take 1 tablet (5 mg total) by mouth every 6 (six) hours as needed for severe pain. 06/09/22  Yes [name], MD  pantoprazole (PROTONIX) 40 MG tablet Take 1 tablet (40 mg total) by mouth 2 (two) times daily. Take Protonix 40 mg p.o. twice daily for 8 weeks till July 01, 2022 then changed to 40 mg once daily indefinitely 05/19/22 03/09/23 Yes Lama, Marge Duncans, MD   atropine 1 % ophthalmic solution Place 1 drop into the right eye 2 (two) times a day.  Patient not taking: Reported on 05/16/2022    [provider]  clindamycin (CLINDAGEL) 1 % gel Apply topically 2 (two) times daily. Patient not taking: Reported on 05/16/2022 03/11/22   [name], NP  [employer] STOMACH RELIEF 525 MG/30ML suspension Place 30 mLs into feeding tube every 4 (four) hours as needed for indigestion. Patient not taking: Reported on 05/16/2022 04/27/22   [provider]  gabapentin (NEURONTIN) 300 MG capsule Take 300 mg by mouth 3 (three) times daily. Patient not taking: Reported on 05/16/2022 04/27/22   [provider]  LENVIMA, 10 MG DAILY DOSE, capsule Take by mouth. Patient not taking: Reported on  05/16/2022 04/15/22   [provider]  loperamide (IMODIUM) 2 MG capsule Take by mouth. Patient not taking: Reported on 05/16/2022 04/22/22   [provider]  magnesium oxide (MAG-OX) 400 (240 Mg) MG tablet Take 1 tablet (400 mg total) by mouth daily. Patient not taking: Reported on 05/16/2022 08/05/21   [name], MD  Nutritional Supplements (KATE FARMS STANDARD 1.4) LIQD 487 mLs by Enteral route 3 (three) times daily. 1.5 cartons (487 ml) [employer] 1.4 TID with 60 ml free water before and 60 ml free water after each TF bolus. Provide an additional 120 ml free water TID. Patient not taking: Reported on 05/16/2022 04/22/21   [name], DO  nystatin (MYCOSTATIN) 100000 UNIT/ML suspension Take 5 mLs (500,000 Units total) by mouth 4 (four) times daily. Patient not taking: Reported on 05/16/2022 08/12/21   [name], PA-C  ondansetron (ZOFRAN) 8 MG tablet Take 1 tablet (8 mg total) by mouth 2 (two) times daily as needed for refractory nausea / vomiting. Starting on day 3 after chemo. Patient not taking: Reported on 05/16/2022 12/31/21   [name], MD  oxybutynin (DITROPAN) 5 MG tablet SMARTSIG:1 Tablet(s) Gastro Tube Twice Daily  PRN Patient not taking: Reported on 05/16/2022 04/27/22   [provider]  prednisoLONE acetate (PRED FORTE) 1 % ophthalmic suspension Place 1 drop into the right eye 4 (four) times daily. Patient not taking: Reported on 05/16/2022    [provider]  prochlorperazine (COMPAZINE) 10 MG tablet Take 1 tablet (10 mg total) by mouth every 6 (six) hours as needed (Nausea or vomiting). Patient not taking: Reported on 05/16/2022 02/11/22   [name], MD  sucralfate (CARAFATE) 1 GM/10ML suspension Place 10 mLs (1 g total) into feeding tube every 6 (six) hours for 9 days. Patient not taking: Reported on 06/18/2022 05/19/22 05/28/22  [name], MD  tiZANidine (ZANAFLEX) 4 MG tablet Take 4 mg by mouth 2 (two) times daily as needed. Patient not taking: Reported on 05/16/2022 02/17/22   [provider]  Vitamin D, Ergocalciferol, (DRISDOL) 1.25 MG (50000 UNIT) CAPS capsule Take 50,000 Units by mouth every 7 (seven) days. Patient not taking: Reported on 05/16/2022 05/22/20   [provider]  Water For Irrigation, Sterile (FREE WATER) SOLN Place 100 mLs into feeding tube every 4 (four) hours. Patient not taking: Reported on 05/16/2022 04/22/21   [name], DO     Objective    Physical Exam: Vitals:   06/19/22 0015 06/19/22 0030 06/19/22 0045 06/19/22 0100  BP: (!) 148/102 (!) 174/148 (!) 156/95 (!) 141/97  Pulse: 97 83 95 97  Resp: (!) 23 (!) _0 Temp:      SpO2: 100% 96% 100% 100%    General: appears to be stated age; alert, oriented; mildl increased wob noted;  Skin: warm, dry, no rash Head:  AT/[place] Mouth:  Oral mucosa membranes appear dry, normal dentition Neck: tracheostomy noted; associated secretions noted.  Heart:  RRR; did not appreciate any M/R/G Lungs: some rhonchi noted, in absence of wheezes, rales.  Abdomen: + BS; soft, ND, NT Vascular: 2+ pedal pulses b/l; 2+ radial pulses b/l Extremities: no peripheral edema, no muscle wasting Neuro: strength  and sensation intact in upper and lower extremities b/l      Labs on Admission: I have personally reviewed following labs and imaging studies  CBC: Recent Labs  Lab 06/18/22 2336  WBC 4.0  NEUTROABS 3.1  HGB 9.5*  HCT 28.9*  MCV 93.5  PLT 735   Basic Metabolic Panel: Recent Labs  Lab 06/18/22 2336  NA 132*  K 3.6  CL 93*  CO2 30  GLUCOSE 113*  BUN 17  CREATININE 0.44*  CALCIUM 9.1   GFR: CrCl cannot be calculated (Unknown ideal weight.). Liver Function Tests: Recent Labs  Lab 06/18/22 2336  AST 18  ALT 13  ALKPHOS 46  BILITOT 0.4  PROT 7.6  ALBUMIN 4.0   No results for input(s): "LIPASE", "AMYLASE" in the last 168 hours. No results for input(s): "AMMONIA" in the last 168 hours. Coagulation Profile: No results for input(s): "INR", "PROTIME" in the last 168 hours. Cardiac Enzymes: No results for input(s): "CKTOTAL", "CKMB", "CKMBINDEX", "TROPONINI" in the last 168 hours. BNP (last 3 results) No results for input(s): "PROBNP" in the last 8760 hours. HbA1C: No results for input(s): "HGBA1C" in the last 72 hours. CBG: No results for input(s): "GLUCAP" in the last 168 hours. Lipid Profile: No results for input(s): "CHOL", "HDL", "LDLCALC", "TRIG", "CHOLHDL", "LDLDIRECT" in the last 72 hours. Thyroid Function Tests: No results for input(s): "TSH", "T4TOTAL", "FREET4", "T3FREE", "THYROIDAB" in the last 72 hours. Anemia Panel: No results for input(s): "VITAMINB12", "FOLATE", "FERRITIN", "TIBC", "IRON", "RETICCTPCT" in the last 72 hours. Urine analysis:    Component Value Date/Time   COLORURINE YELLOW 06/10/2022 1619   APPEARANCEUR CLOUDY (A) 06/10/2022 1619   LABSPEC 1.010 06/10/2022 1619   PHURINE 8.0 06/10/2022 1619   GLUCOSEU NEGATIVE 06/10/2022 1619   HGBUR NEGATIVE 06/10/2022 1619   BILIRUBINUR NEGATIVE 06/10/2022 1619   KETONESUR 5 (A) 06/10/2022 1619   PROTEINUR NEGATIVE 06/10/2022 1619   UROBILINOGEN 4.0 (H) 10/13/2007 1940   NITRITE NEGATIVE  06/10/2022 1619   LEUKOCYTESUR NEGATIVE 06/10/2022 1619    Radiological Exams on Admission: DG Chest Port 1 View  Result Date: 06/18/2022 CLINICAL DATA:  Trach, increased secretions EXAM: PORTABLE CHEST 1 VIEW COMPARISON:  06/10/2022 FINDINGS: Tracheostomy in satisfactory position. Mild bilateral lower lobe opacities, suspicious for aspiration/pneumonia. No pleural effusion or pneumothorax. The heart is normal in size. Left chest port terminates cavoatrial junction. IMPRESSION: Mild bilateral lower lobe opacities, suspicious for aspiration/pneumonia. Electronically Signed   By: Julian Hy M.D.   On: 06/18/2022 23:14     EKG: Independently reviewed, with result as described above.    Assessment/Plan    Principal Problem:   Aspiration pneumonia (HCC) Active Problems:   Hypertension   Hypomagnesemia   GERD (gastroesophageal reflux disease)   Anemia of chronic disease    #) Aspiration pneumonia: dx on basis of 3-4 days of new cough, sob, associated with increased secretions from tracheostomy, with cxr showing bilateral lower airspace opacities c/f aspiration pna. Of note , SIRS criteria not met for sepsis. Blood cx's x 2 collected prior to initiation of unasyn, which will be continued. Covid/flu negative.   Plan: follow for resutls of blood cx's. Continue unasyn. Add-on procalcitonin; check serum phos. Cbc with diff in the AM. Aspiration precautions.            #) Hypomagnesemia: serum mag 1.5.   Plan: MgSO4 2 g iv over 2 hours x 1 dose now.  Monitor on telemetry.            #) Essential Hypertension: documented h/o such, with outpatient antihypertensive regimen including  losartan.  SBP's in the ED today:  130's - 160's  mmHg. In context of suspcted presenting infxn, will hold home losartan for now, but with possiblility of resumption in the AM, depending  upon interval trend in BP and clinical status.  Plan: Close monitoring of subsequent BP via routine VS.   Home home ARB for now, as above.               #) GERD: documented h/o such; on prontix as outpatient.   Plan: continue home PPI.            #) Anemia of chronic disease: Documented history of such, a/w with baseline hgb range  8-11, with presenting hgb consistent with this range, in the absence of any overt evidence of active bleed.     Plan: Repeat CBC in the morning. Check INR.         DVT prophylaxis: SCD's   Code Status: Full code Family Communication: none Disposition Plan: Per Rounding Team Consults called: none;  Admission status: inpatient   PLEASE NOTE THAT DRAGON DICTATION SOFTWARE WAS USED IN THE CONSTRUCTION OF THIS NOTE.   [place] DO [facility] [place]   06/19/2022, 1:14 AM

## 2022-06-19 NOTE — Hospital Course (Signed)
71 y.o. male with medical history significant for laryngeal cancer completed by chronic tracheostomy, essential pretension, GERD, anemia of chronic disease associated baseline hemoglobin range 8-11, who is admitted to [facility] on 06/18/2022 with suspected aspiration pneumonia after presenting from home to [facility] ED complaining of increased secretions from tracheostomy. Pt admitted for concerns of aspiration PNA

## 2022-06-19 NOTE — ED Notes (Signed)
Update provided to family member via pt's cell phone

## 2022-06-19 NOTE — Progress Notes (Signed)
  Progress Note   [name] [id number]:[id number] DOB: Jun 16, 1951 DOA: 06/18/2022     0 DOS: the patient was seen and examined on 06/19/2022   Brief hospital course: 71 y.o. male with medical history significant for laryngeal cancer completed by chronic tracheostomy, essential pretension, GERD, anemia of chronic disease associated baseline hemoglobin range 8-11, who is admitted to [facility] on 06/18/2022 with suspected aspiration pneumonia after presenting from home to [facility] ED complaining of increased secretions from tracheostomy. Pt admitted for concerns of aspiration PNA  Assessment and Plan: #) Aspiration pneumonia: dx on basis of 3-4 days of new cough, sob, associated with increased secretions from tracheostomy, with cxr showing bilateral lower airspace opacities c/f aspiration pna.  -Pt is continued on unasyn -Covid/flu negative.  -Pro cal elevated at 0.38    #) Hypomagnesemia: serum mag 1.5.  -replaced -Cont to follow lytes and replace as needed   #) Essential Hypertension:  -losartan initially held given concerns of active infection -BP mildly elevated, renal function stable. -Will resume home bp meds  #) GERD:  -documented h/o such; on prontix as outpatient.  -cont PPI   #) Anemia of chronic disease:  -Hgb stable this AM -No evidence of acute blood loss -Recheck cbc in AM       Subjective: Without complaints. Reports feeling somewhat better  Physical Exam: Vitals:   06/19/22 1148 06/19/22 1213 06/19/22 1306 06/19/22 1534  BP:   130/89   Pulse:  82 80 71  Resp:  '20 18 18  '$ Temp: 98.7 F (37.1 C)     TempSrc: Oral     SpO2:  100% 100% 100%   General exam: Awake, laying in bed, in nad Respiratory system: coarse breath sounds, no audible wheezing, trach in place Cardiovascular system: regular rate, s1, s2 Gastrointestinal system: Soft, nondistended, positive BS Central nervous system: CN2-12 grossly intact, strength intact Extremities: Perfused, no  clubbing Skin: Normal skin turgor, no notable skin lesions seen Psychiatry: Mood normal // no visual hallucinations   Data Reviewed:  Labs reviewed: Na 134, K 3.6, Cr 0.35, Hgb 8.9  Family Communication:  Pt in room, family not at bedside  Disposition: Status is: Inpatient Remains inpatient appropriate because: Severity of illness  Planned Discharge Destination: Home    Author: Marylu Lund, MD 06/19/2022 3:50 PM  For on call review [email or web address].

## 2022-06-19 NOTE — ED Notes (Signed)
Pt c/o headache, request for liquid Tylenol as pt has a Gtube, secure message sent to [employer]. Awaiting response, refer to [facility]

## 2022-06-19 NOTE — ED Notes (Signed)
Pt resting, NAD noted, observed even RR and unlabored, side rails up x2 for safety, plan of care ongoing, pt expresses no needs or concerns at this time, call light within reach, no further concerns as of present.  

## 2022-06-20 DIAGNOSIS — J690 Pneumonitis due to inhalation of food and vomit: Secondary | ICD-10-CM | POA: Diagnosis not present

## 2022-06-20 DIAGNOSIS — D638 Anemia in other chronic diseases classified elsewhere: Secondary | ICD-10-CM | POA: Diagnosis not present

## 2022-06-20 LAB — CBC
HCT: 28.2 % — ABNORMAL LOW (ref 39.0–52.0)
Hemoglobin: 9.2 g/dL — ABNORMAL LOW (ref 13.0–17.0)
MCH: 30.7 pg (ref 26.0–34.0)
MCHC: 32.6 g/dL (ref 30.0–36.0)
MCV: 94 fL (ref 80.0–100.0)
Platelets: 259 10*3/uL (ref 150–400)
RBC: 3 MIL/uL — ABNORMAL LOW (ref 4.22–5.81)
RDW: 15.3 % (ref 11.5–15.5)
WBC: 12.4 10*3/uL — ABNORMAL HIGH (ref 4.0–10.5)
nRBC: 0 % (ref 0.0–0.2)

## 2022-06-20 LAB — COMPREHENSIVE METABOLIC PANEL
ALT: 10 U/L (ref 0–44)
AST: 12 U/L — ABNORMAL LOW (ref 15–41)
Albumin: 3.3 g/dL — ABNORMAL LOW (ref 3.5–5.0)
Alkaline Phosphatase: 44 U/L (ref 38–126)
Anion gap: 10 (ref 5–15)
BUN: 9 mg/dL (ref 8–23)
CO2: 29 mmol/L (ref 22–32)
Calcium: 9.2 mg/dL (ref 8.9–10.3)
Chloride: 98 mmol/L (ref 98–111)
Creatinine, Ser: 0.53 mg/dL — ABNORMAL LOW (ref 0.61–1.24)
GFR, Estimated: >60 mL/min (ref >60)
Glucose, Bld: 109 mg/dL — ABNORMAL HIGH (ref 70–99)
Potassium: 3.5 mmol/L (ref 3.5–5.1)
Sodium: 137 mmol/L (ref 135–145)
Total Bilirubin: 0.4 mg/dL (ref 0.3–1.2)
Total Protein: 7.1 g/dL (ref 6.5–8.1)

## 2022-06-20 LAB — MAGNESIUM: Magnesium: 2 mg/dL (ref 1.7–2.4)

## 2022-06-20 MED ORDER — PREDNISOLONE ACETATE 1 % OP SUSP
1.0000 [drp] | Freq: Four times a day (QID) | OPHTHALMIC | Status: DC
  Administered 2022-06-20 – 2022-06-21 (×6): 1 [drp] via OPHTHALMIC
  Filled 2022-06-20: qty 5

## 2022-06-20 MED ORDER — ORAL CARE MOUTH RINSE
15.0000 mL | OROMUCOSAL | Status: DC
  Administered 2022-06-20 – 2022-06-21 (×7): 15 mL via OROMUCOSAL

## 2022-06-20 MED ORDER — ATROPINE SULFATE 1 % OP SOLN
1.0000 [drp] | Freq: Three times a day (TID) | OPHTHALMIC | Status: DC
  Administered 2022-06-20 – 2022-06-21 (×5): 1 [drp] via OPHTHALMIC
  Filled 2022-06-20: qty 2

## 2022-06-20 MED ORDER — PROSOURCE TF20 ENFIT COMPATIBL EN LIQD
60.0000 mL | Freq: Every day | ENTERAL | Status: DC
  Administered 2022-06-21: 60 mL
  Filled 2022-06-20 (×2): qty 60

## 2022-06-20 MED ORDER — KATE FARMS STANDARD 1.4 PO LIQD
325.0000 mL | Freq: Four times a day (QID) | ORAL | Status: DC
  Administered 2022-06-20 – 2022-06-21 (×5): 325 mL
  Filled 2022-06-20 (×8): qty 325

## 2022-06-20 MED ORDER — ORAL CARE MOUTH RINSE: 15.0000 mL | OROMUCOSAL | Status: DC | PRN

## 2022-06-20 MED ORDER — TIZANIDINE HCL 4 MG PO TABS
4.0000 mg | ORAL_TABLET | Freq: Two times a day (BID) | ORAL | Status: DC | PRN
  Administered 2022-06-20 – 2022-06-21 (×3): 4 mg
  Filled 2022-06-20 (×3): qty 1

## 2022-06-20 MED ORDER — OXYCODONE HCL 5 MG PO TABS
5.0000 mg | ORAL_TABLET | Freq: Four times a day (QID) | ORAL | Status: DC | PRN
  Administered 2022-06-20 – 2022-06-21 (×4): 5 mg
  Filled 2022-06-20 (×4): qty 1

## 2022-06-20 MED ORDER — FREE WATER
100.0000 mL | Freq: Four times a day (QID) | Status: DC
  Administered 2022-06-20 – 2022-06-21 (×5): 100 mL

## 2022-06-20 MED ORDER — LOSARTAN POTASSIUM 50 MG PO TABS
50.0000 mg | ORAL_TABLET | Freq: Every day | ORAL | Status: DC
  Administered 2022-06-20 – 2022-06-21 (×2): 50 mg
  Filled 2022-06-20 (×2): qty 1

## 2022-06-20 NOTE — Progress Notes (Signed)
  Progress Note   [name] ASN:[id number] DOB: 10-26-1950 DOA: 06/18/2022     1 DOS: the patient was seen and examined on 06/20/2022   Brief hospital course: 71 y.o. male with medical history significant for laryngeal cancer completed by chronic tracheostomy, essential pretension, GERD, anemia of chronic disease associated baseline hemoglobin range 8-11, who is admitted to [facility] on 06/18/2022 with suspected aspiration pneumonia after presenting from home to [facility] ED complaining of increased secretions from tracheostomy. Pt admitted for concerns of aspiration PNA  Assessment and Plan: #) Aspiration pneumonia: dx on basis of 3-4 days of new cough, sob, associated with increased secretions from tracheostomy, with cxr showing bilateral lower airspace opacities c/f aspiration pna.  -Will cont unasyn -Covid/flu negative.  -Pro cal elevated at 0.38    #) Hypomagnesemia: serum mag 1.5 initially -replaced -Cont to follow lytes and replace as needed   #) Essential Hypertension:  -losartan initially held given concerns of active infection -BP mildly elevated, renal function stable. -Cont home meds  #) GERD:  -documented h/o such; on prontix as outpatient.  -cont PPI   #) Anemia of chronic disease:  -Hgb stable this AM -No evidence of acute blood loss -cont to follow cbc trends  #) Severe protein calorie malnutrition -dietitian following, continued tube feeding -Very thin and appears weak -PT consulted, pending recs       Subjective: States feeling "some" better today. Is reporting headache  Physical Exam: Vitals:   06/20/22 1106 06/20/22 1120 06/20/22 1249 06/20/22 1441  BP:  119/83    Pulse:  68    Resp: (!) 25 (!) 22    Temp:  98.3 F (36.8 C)    TempSrc:  Oral    SpO2:  100% 98% 100%  Weight:      Height:       General exam: Conversant, in no acute distress Respiratory system: normal chest rise, clear, no audible wheezing Cardiovascular system:  regular rhythm, s1-s2 Gastrointestinal system: Nondistended, nontender, pos BS Central nervous system: No seizures, no tremors Extremities: No cyanosis, no joint deformities Skin: No rashes, no pallor Psychiatry: Affect normal // no auditory hallucinations   Data Reviewed:  Labs reviewed: Na 137, K 3.5, Cr 0.53, Hgb 9.2  Family Communication:  Pt in room, family not at bedside  Disposition: Status is: Inpatient Remains inpatient appropriate because: Severity of illness  Planned Discharge Destination: Home    Author: Marylu Lund, MD 06/20/2022 4:04 PM  For on call review [email or web address].

## 2022-06-20 NOTE — Progress Notes (Signed)
Initial Nutrition Assessment  DOCUMENTATION CODES:   Severe malnutrition in context of chronic illness, Underweight  INTERVENTION:   -Provide 1 carton (325 ml) [employer] 1.4 QID via PEG -Flush with 60 ml free water before and after each feeding -60 ml Prosource 20 TF daily -Allow 4 hours between each feeding -Additional free water ordered: 100 ml every 6 hours (400 ml) -Provides 1900 kcals, 100g protein and 1816 ml H2O   NUTRITION DIAGNOSIS:   Severe Malnutrition related to chronic illness, cancer and cancer related treatments as evidenced by severe fat depletion, severe muscle depletion, percent weight loss.  GOAL:   Patient will meet greater than or equal to 90% of their needs  MONITOR:   PO intake, Supplement acceptance, Labs, Weight trends, I & O's  REASON FOR ASSESSMENT:   Consult Enteral/tube feeding initiation and management  ASSESSMENT:   71 y.o. male with medical history significant for laryngeal cancer completed by chronic tracheostomy, essential pretension, GERD, anemia of chronic disease associated baseline hemoglobin range 8-11, who is admitted to [facility] on 06/18/2022 with suspected aspiration pneumonia after presenting from home to [facility] ED complaining of increased secretions from tracheostomy. Pt admitted for concerns of aspiration PNA  Patient sleeping, no family at bedside.  Reviewed home medication list. Pt is recommended to administer [facility] 1.4 QID. In previous visits with [place] RD, pt has only been using 2 cartons daily. Resumed QID and will monitor for tolerance. Will also add Prosource TF once daily to help meet protein needs.   Per weight records, pt has lost 17 lbs since 4/24 (15% wt loss x 5.5 months, significant for time frame).  Medications reviewed.  Labs reviewed.  NUTRITION - FOCUSED PHYSICAL EXAM:  Flowsheet Row Most Recent Value  Orbital Region Severe depletion  Upper Arm Region Severe depletion  Thoracic and  Lumbar Region Severe depletion  Temple Region Severe depletion  Clavicle Bone Region Severe depletion  Clavicle and Acromion Bone Region Severe depletion  Scapular Bone Region Severe depletion  Dorsal Hand Severe depletion  Patellar Region Severe depletion  Anterior Thigh Region Severe depletion  Posterior Calf Region Severe depletion  Edema (RD Assessment) None  Hair Reviewed  Eyes Unable to assess  Mouth Unable to assess  Skin Reviewed  Nails Reviewed       Diet Order:   Diet Order             Diet NPO time specified  Diet effective now                   EDUCATION NEEDS:   No education needs have been identified at this time  Skin:  Skin Assessment: Reviewed RN Assessment  Last BM:  10/4  Height:   Ht Readings from Last 1 Encounters:  06/19/22 '5\' 8"'$  (1.727 m)    Weight:   Wt Readings from Last 1 Encounters:  06/20/22 41.4 kg    BMI:  Body mass index is 13.88 kg/m.  Estimated Nutritional Needs:   Kcal:  2000-2200  Protein:  105-115g  Fluid:  2L/day   Clayton Bibles, MS, RD, LDN Inpatient Clinical Dietitian Contact information available via Am[employer]

## 2022-06-20 NOTE — Progress Notes (Signed)
PT Cancellation Note  Patient Details Name: [name] MRN: [id number] DOB: 03/15/1951   Cancelled Treatment:    Reason Eval/Treat Not Completed: Patient declined, no reason specified Will check back tomorrow [place] Office [phone] Weekend [phone]   [name] 06/20/2022, 4:13 PM

## 2022-06-20 NOTE — Progress Notes (Signed)
Pt has trach & on/off O2. Placed order for continuous pulse oximetry per RT request, stated standing order for trach pts.  called TELE tech during implementation of care.

## 2022-06-21 DIAGNOSIS — J690 Pneumonitis due to inhalation of food and vomit: Secondary | ICD-10-CM | POA: Diagnosis not present

## 2022-06-21 DIAGNOSIS — D638 Anemia in other chronic diseases classified elsewhere: Secondary | ICD-10-CM | POA: Diagnosis not present

## 2022-06-21 LAB — CBC
HCT: 26.5 % — ABNORMAL LOW (ref 39.0–52.0)
Hemoglobin: 8.6 g/dL — ABNORMAL LOW (ref 13.0–17.0)
MCH: 30.4 pg (ref 26.0–34.0)
MCHC: 32.5 g/dL (ref 30.0–36.0)
MCV: 93.6 fL (ref 80.0–100.0)
Platelets: 276 10*3/uL (ref 150–400)
RBC: 2.83 MIL/uL — ABNORMAL LOW (ref 4.22–5.81)
RDW: 15.3 % (ref 11.5–15.5)
WBC: 11.1 10*3/uL — ABNORMAL HIGH (ref 4.0–10.5)
nRBC: 0 % (ref 0.0–0.2)

## 2022-06-21 LAB — COMPREHENSIVE METABOLIC PANEL
ALT: 12 U/L (ref 0–44)
AST: 13 U/L — ABNORMAL LOW (ref 15–41)
Albumin: 3.1 g/dL — ABNORMAL LOW (ref 3.5–5.0)
Alkaline Phosphatase: 45 U/L (ref 38–126)
Anion gap: 7 (ref 5–15)
BUN: 17 mg/dL (ref 8–23)
CO2: 30 mmol/L (ref 22–32)
Calcium: 9 mg/dL (ref 8.9–10.3)
Chloride: 100 mmol/L (ref 98–111)
Creatinine, Ser: 0.48 mg/dL — ABNORMAL LOW (ref 0.61–1.24)
GFR, Estimated: >60 mL/min (ref >60)
Glucose, Bld: 141 mg/dL — ABNORMAL HIGH (ref 70–99)
Potassium: 3.2 mmol/L — ABNORMAL LOW (ref 3.5–5.1)
Sodium: 137 mmol/L (ref 135–145)
Total Bilirubin: 0.4 mg/dL (ref 0.3–1.2)
Total Protein: 6.7 g/dL (ref 6.5–8.1)

## 2022-06-21 MED ORDER — AMOXICILLIN-POT CLAVULANATE 875-125 MG PO TABS: 1.0000 | ORAL_TABLET | Freq: Two times a day (BID) | ORAL | 0 refills | Status: AC

## 2022-06-21 MED ORDER — POTASSIUM CHLORIDE 20 MEQ PO PACK
60.0000 meq | PACK | Freq: Once | ORAL | Status: AC
  Administered 2022-06-21: 60 meq
  Filled 2022-06-21: qty 3

## 2022-06-21 NOTE — Plan of Care (Signed)
  Problem: Clinical Measurements: Goal: Respiratory complications will improve Outcome: Adequate for Discharge   Problem: Activity: Goal: Risk for activity intolerance will decrease Outcome: Adequate for Discharge   Problem: Nutrition: Goal: Adequate nutrition will be maintained Outcome: Adequate for Discharge

## 2022-06-21 NOTE — Progress Notes (Signed)
Trach suction resulted in small, white, yellow, thick. PT tolerate fairly well, states throat is sore.

## 2022-06-21 NOTE — Progress Notes (Signed)
RN notified of trach change and the importance of using the humidity trach collar and inner cannulas.

## 2022-06-21 NOTE — Progress Notes (Signed)
OT Cancellation Note  Patient Details Name: [name] MRN: [id number] DOB: Mar 26, 1951   Cancelled Treatment:    Reason Eval/Treat Not Completed: Patient declined, no reason specified. Declined to work with OT. See PT note for current functional status. Will follow up as able.  Tymeshia Awan L Rendell Thivierge 06/21/2022, 4:06 PM

## 2022-06-21 NOTE — Evaluation (Signed)
Physical Therapy Evaluation Patient Details Name: [name] MRN: [id number] DOB: April 03, 1951 Today's Date: 06/21/2022  History of Present Illness  71 yo male admitted with asp pna. Hx of laryngeal Ca, tracheostomy, anemia, ETOH abuse, Gtube  Clinical Impression  On eval, pt was Supv-Mod Ind with mobility. He walked ~150 feet while managing IV pole. O2 95% on RA-pt declined wearing O2 for ambulation. During session, patient removed inner cannula tubing of trach. He was agreeable to me putting the trach O2 back on once back in room. Made RN aware of patient removing inner cannula of trach-RN in to address situation at end of session. Discussed d/c plan-pt politely declines HHPT f/u. He reports not requiring assistance for ambulation or ADLs prior to this admission. Will follow during this hospital stay.        Recommendations for follow up therapy are one component of a multi-disciplinary discharge planning process, led by the attending physician.  Recommendations may be updated based on patient status, additional functional criteria and insurance authorization.  Follow Up Recommendations No PT follow up (pt politely declines HHPT f/u)      Assistance Recommended at Discharge PRN  Patient can return home with the following  Assist for transportation;Assistance with cooking/housework    Equipment Recommendations None recommended by PT  Recommendations for Other Services       Functional Status Assessment Patient has had a recent decline in their functional status and demonstrates the ability to make significant improvements in function in a reasonable and predictable amount of time.     Precautions / Restrictions Precautions Precaution Comments: trach Restrictions Weight Bearing Restrictions: No      Mobility  Bed Mobility Overal bed mobility: Modified Independent                  Transfers Overall transfer level: Modified independent                       Ambulation/Gait Ambulation/Gait assistance: Supervision Gait Distance (Feet): 150 Feet Assistive device: IV Pole Gait Pattern/deviations: Step-through pattern       General Gait Details: No LOB. O2 95% on RA  Stairs            Wheelchair Mobility    Modified Rankin (Stroke Patients Only)       Balance Overall balance assessment: Mild deficits observed, not formally tested                                           Pertinent Vitals/Pain Pain Assessment Pain Assessment: No/denies pain    Home Living Family/patient expects to be discharged to:: Private residence Living Arrangements: Other relatives (sister) Available Help at Discharge: Family;Available PRN/intermittently Type of Home: House         Home Layout: One level Home Equipment: Conservation officer, nature (2 wheels)      Prior Function Prior Level of Function : Independent/Modified Independent                     Hand Dominance        Extremity/Trunk Assessment   Upper Extremity Assessment Upper Extremity Assessment: Defer to OT evaluation    Lower Extremity Assessment Lower Extremity Assessment: Overall WFL for tasks assessed    Cervical / Trunk Assessment Cervical / Trunk Assessment: Normal  Communication   Communication: Tracheostomy  Cognition Arousal/Alertness: Awake/alert Behavior  During Therapy: WFL for tasks assessed/performed Overall Cognitive Status: Within Functional Limits for tasks assessed                                          General Comments      Exercises     Assessment/Plan    PT Assessment Patient needs continued PT services  PT Problem List Decreased mobility       PT Treatment Interventions Gait training;Therapeutic activities;Therapeutic exercise;Patient/family education;Functional mobility training    PT Goals (Current goals can be found in the Care Plan section)  Acute Rehab PT Goals Patient Stated Goal: none  stated PT Goal Formulation: With patient Time For Goal Achievement: 07/05/22 Potential to Achieve Goals: Good    Frequency Min 3X/week     Co-evaluation               AM-PAC PT "6 Clicks" Mobility  Outcome Measure Help needed turning from your back to your side while in a flat bed without using bedrails?: None Help needed moving from lying on your back to sitting on the side of a flat bed without using bedrails?: None Help needed moving to and from a bed to a chair (including a wheelchair)?: None Help needed standing up from a chair using your arms (e.g., wheelchair or bedside chair)?: None Help needed to walk in hospital room?: A Little Help needed climbing 3-5 steps with a railing? : A Little 6 Click Score: 22    End of Session Equipment Utilized During Treatment: Oxygen (wearing in room for humidification) Activity Tolerance: Patient tolerated treatment well Patient left: in bed;with call bell/phone within reach;with bed alarm set   PT Visit Diagnosis: Difficulty in walking, not elsewhere classified (R26.2)    Time: 6468-0321 PT Time Calculation (min) (ACUTE ONLY): 21 min   Charges:   PT Evaluation $PT Eval Low Complexity: 1 Low            Doreatha Massed, PT Acute Rehabilitation  Office: [phone] Pager: [phone]

## 2022-06-21 NOTE — TOC Progression Note (Addendum)
Transition of Care [facility]) - Progression Note    Patient Details  Name: [name] MRN: [id number] Date of Birth: 01-25-1951  [facility] CM/SW Contact  Leeroy Cha, RN Phone Number: 06/21/2022, 4:03 PM  Clinical Narrative:    Muitiple text to hhc agencies in the area.  No one wants to accept a  trach.  Dr. Wyline Copas made aware of this. Tct-jasamine with [facility] to see if supplies are through them.  If so then they have an r.t. that go out and check on patient.  wcb [facility] will see if they can take the patient.  [facility]cb. [facility] is alos unable to taker.  Information for the [facility] given to patient with the dc instructions to call on Monday. Expected Discharge Plan: [place] Barriers to Discharge: Barriers Resolved  Expected Discharge Plan and Services Expected Discharge Plan: [place]: RN [place]: [place] Date [place]: 06/21/22 Time HH Agency Contacted: 1603 Representative spoke with at [place]: see note   Social Determinants of Health (SDOH) Interventions    Readmission Risk Interventions   No data to display

## 2022-06-21 NOTE — Discharge Summary (Signed)
Physician Discharge Summary   [name] MRN: [id number] DOB: 09-20-1950  Admit date:     06/18/2022  Discharge date: 06/21/22  Discharge Physician: Marylu Lund   PCP: [name], MD   Recommendations at discharge:    Follow up with PCP in 1-2 weeks  Discharge Diagnoses: Principal Problem:   Aspiration pneumonia (Ramirez-Perez) Active Problems:   Hypertension   Hypomagnesemia   GERD (gastroesophageal reflux disease)   Anemia of chronic disease  Resolved Problems:   * No resolved hospital problems. *  Hospital Course: 71 y.o. male with medical history significant for laryngeal cancer completed by chronic tracheostomy, essential pretension, GERD, anemia of chronic disease associated baseline hemoglobin range 8-11, who is admitted to [facility] on 06/18/2022 with suspected aspiration pneumonia after presenting from home to [facility] ED complaining of increased secretions from tracheostomy. Pt admitted for concerns of aspiration PNA  Assessment and Plan: #) Aspiration pneumonia: dx on basis of 3-4 days of new cough, sob, associated with increased secretions from tracheostomy, with cxr showing bilateral lower airspace opacities c/f aspiration pna.  -Pt was continued on unasyn, to complete course of augmentin on d/c -Covid/flu negative.  -remained on baseline O2 requirements -Pro cal elevated at 0.38    #) Hypomagnesemia: serum mag 1.5 initially -replaced -Cont to follow lytes and replace as needed   #) Essential Hypertension:  -losartan initially held given concerns of active infection -BP mildly elevated, renal function stable. -Cont home meds   #) GERD:  -documented h/o such; on prontix as outpatient.  -cont PPI   #) Anemia of chronic disease:  -Hgb remained stable -No evidence of acute blood loss   #) Severe protein calorie malnutrition -dietitian following, continued tube feeding -Very thin and appears weak -PT consulted, no PT needs identified by  therapy       Consultants:  Procedures performed:   Disposition: Home Diet recommendation:  NPO with tube feeding DISCHARGE MEDICATION: Allergies as of 06/21/2022   No Known Allergies      Medication List     STOP taking these medications    clindamycin 1 % gel Commonly known as: Clindagel   nystatin 100000 UNIT/ML suspension Commonly known as: MYCOSTATIN   sucralfate 1 GM/10ML suspension Commonly known as: CARAFATE       TAKE these medications    amoxicillin-clavulanate 875-125 MG tablet Commonly known as: AUGMENTIN Place 1 tablet into feeding tube 2 (two) times daily for 5 days.   atropine 1 % ophthalmic solution Place 1 drop into the right eye 2 (two) times a day.   [employer] Stomach Relief 262 MG/15ML suspension Generic drug: bismuth subsalicylate Place 30 mLs into feeding tube every 4 (four) hours as needed for indigestion.   free water Soln Place 100 mLs into feeding tube every 4 (four) hours.   gabapentin 300 MG capsule Commonly known as: NEURONTIN Take 300 mg by mouth 3 (three) times daily.   [employer] [place] Standard 1.4 Liqd 487 mLs by Enteral route 3 (three) times daily. 1.5 cartons (487 ml) [employer] 1.4 TID with 60 ml free water before and 60 ml free water after each TF bolus. Provide an additional 120 ml free water TID.   Lenvima (10 MG Daily Dose) capsule Generic drug: lenvatinib 10 mg daily dose Take by mouth.   loperamide 2 MG capsule Commonly known as: IMODIUM Take by mouth.   losartan 50 MG tablet Commonly known as: COZAAR Take 50 mg by mouth daily.   magnesium oxide  400 (240 Mg) MG tablet Commonly known as: MAG-OX Take 1 tablet (400 mg total) by mouth daily.   magnesium oxide 400 MG tablet Commonly known as: MAG-OX Take 1 tablet by mouth daily.   ondansetron 8 MG tablet Commonly known as: Zofran Take 1 tablet (8 mg total) by mouth 2 (two) times daily as needed for refractory nausea / vomiting. Starting on day 3 after chemo.    oxybutynin 5 MG tablet Commonly known as: DITROPAN SMARTSIG:1 Tablet(s) Gastro Tube Twice Daily PRN   oxyCODONE 5 MG immediate release tablet Commonly known as: Oxy IR/ROXICODONE Take 1 tablet (5 mg total) by mouth every 6 (six) hours as needed for severe pain.   pantoprazole 40 MG tablet Commonly known as: PROTONIX Take 1 tablet (40 mg total) by mouth 2 (two) times daily. Take Protonix 40 mg p.o. twice daily for 8 weeks till July 01, 2022 then changed to 40 mg once daily indefinitely   prednisoLONE acetate 1 % ophthalmic suspension Commonly known as: PRED FORTE Place 1 drop into the right eye 4 (four) times daily.   prochlorperazine 10 MG tablet Commonly known as: COMPAZINE Take 1 tablet (10 mg total) by mouth every 6 (six) hours as needed (Nausea or vomiting).   tiZANidine 4 MG tablet Commonly known as: ZANAFLEX Take 4 mg by mouth 2 (two) times daily as needed.   Vitamin D (Ergocalciferol) 1.25 MG (50000 UNIT) Caps capsule Commonly known as: DRISDOL Take 50,000 Units by mouth every 7 (seven) days.        Follow-up Information     [name], MD Follow up in 1 week(s).   Specialty: Internal Medicine Why: Hospital follow up Contact information: [place] [id number] [phone]                Discharge Exam: Danley Danker Weights   06/19/22 1954 06/20/22 0440 06/21/22 0440  Weight: 41.4 kg 41.4 kg 40.8 kg   General exam: Awake, laying in bed, in nad Respiratory system: Normal respiratory effort, no wheezing Cardiovascular system: regular rate, s1, s2 Gastrointestinal system: Soft, nondistended, positive BS Central nervous system: CN2-12 grossly intact, strength intact Extremities: Perfused, no clubbing Skin: Normal skin turgor, no notable skin lesions seen Psychiatry: Mood normal // no visual hallucinations   Condition at discharge: fair  The results of significant diagnostics from this hospitalization (including imaging,  microbiology, ancillary and laboratory) are listed below for reference.   Imaging Studies: DG Chest Port 1 View  Result Date: 06/18/2022 CLINICAL DATA:  Trach, increased secretions EXAM: PORTABLE CHEST 1 VIEW COMPARISON:  06/10/2022 FINDINGS: Tracheostomy in satisfactory position. Mild bilateral lower lobe opacities, suspicious for aspiration/pneumonia. No pleural effusion or pneumothorax. The heart is normal in size. Left chest port terminates cavoatrial junction. IMPRESSION: Mild bilateral lower lobe opacities, suspicious for aspiration/pneumonia. Electronically Signed   By: Julian Hy M.D.   On: 06/18/2022 23:14   DG Abd Acute W/Chest  Result Date: 06/10/2022 CLINICAL DATA:  Abdominal pain with difficulty breathing. EXAM: DG ABDOMEN ACUTE WITH 1 VIEW CHEST COMPARISON:  Chest radiograph 05/27/2022 and CT AP 05/14/2022 FINDINGS: There is a left chest wall port a catheter with tip at the cavoatrial junction. The tracheostomy tube tip is above the carina. Heart size and mediastinal contours appear normal. No signs of pleural effusion or edema. No airspace opacities. Left upper quadrant percutaneous gastrostomy tube is noted. No dilated bowel loops identified. No abnormal abdominal or pelvic calcifications. IMPRESSION: 1. Nonobstructive bowel gas pattern. 2. No active  cardiopulmonary abnormalities. Electronically Signed   By: Kerby Moors M.D.   On: 06/10/2022 17:15   DG Chest Port 1 View  Result Date: 05/27/2022 CLINICAL DATA:  Shortness of breath. Possible tracheostomy dysfunction. EXAM: PORTABLE CHEST 1 VIEW COMPARISON:  Radiographs 05/01/2022 and 12/31/2021.  CT 03/03/2022. FINDINGS: 1507 hours. Two views obtained. The tracheostomy and left IJ Port-A-Cath are unchanged in position. Probable percutaneous G-tube, incompletely visualized. The heart size and mediastinal contours are stable. The lungs are clear. There is no pleural effusion or pneumothorax. No acute osseous findings are evident.  IMPRESSION: Stable postoperative chest. No evidence of active cardiopulmonary process. Electronically Signed   By: Richardean Sale M.D.   On: 05/27/2022 15:27    Microbiology: Results for orders placed or performed during the hospital encounter of 06/18/22  Blood culture (routine x 2)     Status: None (Preliminary result)   Collection Time: 06/18/22 11:33 PM   Specimen: BLOOD  Result Value Ref Range Status   Specimen Description   Final    BLOOD RIGHT ANTECUBITAL Performed at [facility] [address] [place], [place] [address]    Special Requests   Final    BOTTLES DRAWN AEROBIC AND ANAEROBIC Blood Culture results may not be optimal due to an excessive volume of blood received in culture bottles Performed at [facility] [address], [place], [place] [address]    Culture   Final    NO GROWTH 2 DAYS Performed at [facility] [address], [place], [place] [address]    Report Status PENDING  Incomplete  Blood culture (routine x 2)     Status: None (Preliminary result)   Collection Time: 06/18/22 11:36 PM   Specimen: BLOOD  Result Value Ref Range Status   Specimen Description   Final    BLOOD BLOOD LEFT FOREARM Performed at [facility] [address] [place], [place] [address]    Special Requests   Final    BOTTLES DRAWN AEROBIC AND ANAEROBIC Blood Culture adequate volume Performed at [facility] [address], [place], [place] [address]    Culture   Final    NO GROWTH 2 DAYS Performed at [facility] [address], [place], [place] [address]    Report Status PENDING  Incomplete  Resp Panel by RT-PCR (Flu A&B, Covid) Anterior Nasal Swab     Status: None   Collection Time: 06/18/22 11:45 PM   Specimen: Anterior Nasal Swab  Result Value Ref Range Status   SARS Coronavirus 2 by RT PCR NEGATIVE NEGATIVE Final    Comment: (NOTE) SARS-CoV-2 target nucleic acids are NOT  DETECTED.  The SARS-CoV-2 RNA is generally detectable in upper respiratory specimens during the acute phase of infection. The lowest concentration of SARS-CoV-2 viral copies this assay can detect is 138 copies/mL. A negative result does not preclude SARS-Cov-2 infection and should not be used as the sole basis for treatment or other patient management decisions. A negative result may occur with  improper specimen collection/handling, submission of specimen other than nasopharyngeal swab, presence of viral mutation(s) within the areas targeted by this assay, and inadequate number of viral copies(<138 copies/mL). A negative result must be combined with clinical observations, patient history, and epidemiological information. The expected result is Negative.  Fact Sheet for Patients:  EntrepreneurPulse.com.au  Fact Sheet for Healthcare Providers:  IncredibleEmployment.be  This test is no t yet approved or cleared by the [place]  FDA and  has been authorized for detection and/or diagnosis of SARS-CoV-2 by FDA under an Emergency Use Authorization (EUA). This EUA will remain  in effect (meaning this test can be used) for the duration of the COVID-19 declaration under Section 564(b)(1) of the Act, 21 U.S.C.section 360bbb-3(b)(1), unless the authorization is terminated  or revoked sooner.       Influenza A by PCR NEGATIVE NEGATIVE Final   Influenza B by PCR NEGATIVE NEGATIVE Final    Comment: (NOTE) The Xpert Xpress SARS-CoV-2/FLU/RSV plus assay is intended as an aid in the diagnosis of influenza from Nasopharyngeal swab specimens and should not be used as a sole basis for treatment. Nasal washings and aspirates are unacceptable for Xpert Xpress SARS-CoV-2/FLU/RSV testing.  Fact Sheet for Patients: EntrepreneurPulse.com.au  Fact Sheet for Healthcare Providers: IncredibleEmployment.be  This test is not yet  approved or cleared by the [place] FDA and has been authorized for detection and/or diagnosis of SARS-CoV-2 by FDA under an Emergency Use Authorization (EUA). This EUA will remain in effect (meaning this test can be used) for the duration of the COVID-19 declaration under Section 564(b)(1) of the Act, 21 U.S.C. section 360bbb-3(b)(1), unless the authorization is terminated or revoked.  Performed at [facility], [place] [address], [place], [place] [address]     Labs: CBC: Recent Labs  Lab 06/18/22 2336 06/19/22 0514 06/20/22 0407 06/21/22 0417  WBC 4.0 5.4 12.4* 11.1*  NEUTROABS 3.1 4.7  --   --   HGB 9.5* 8.9* 9.2* 8.6*  HCT 28.9* 26.6* 28.2* 26.5*  MCV 93.5 92.0 94.0 93.6  PLT 306 217 259 562   Basic Metabolic Panel: Recent Labs  Lab 06/18/22 2236 06/18/22 2336 06/19/22 0514 06/20/22 0407 06/21/22 0417  NA  --  132* 134* 137 137  K  --  3.6 3.6 3.5 3.2*  CL  --  93* 97* 98 100  CO2  --  '30 28 29 30  '$ GLUCOSE  --  113* 116* 109* 141*  BUN  --  '17 13 9 17  '$ CREATININE  --  0.44* 0.35* 0.53* 0.48*  CALCIUM  --  9.1 9.2 9.2 9.0  MG 1.9  --  1.5* 2.0  --   PHOS  --   --  2.4*  --   --    Liver Function Tests: Recent Labs  Lab 06/18/22 2336 06/19/22 0514 06/20/22 0407 06/21/22 0417  AST 18 17 12* 13*  ALT '13 12 10 12  '$ ALKPHOS 46 41 44 45  BILITOT 0.4 0.4 0.4 0.4  PROT 7.6 7.0 7.1 6.7  ALBUMIN 4.0 3.6 3.3* 3.1*   CBG: No results for input(s): "GLUCAP" in the last 168 hours.  Discharge time spent: less than 30 minutes.  Signed: Marylu Lund, MD [facility] 06/21/2022

## 2022-06-21 NOTE — Procedures (Signed)
Tracheostomy Change Note  Patient Details:   Name: [name] DOB: [birth year] MRN: [id number]    Airway Documentation:     Evaluation  O2 sats: stable throughout Complications: No apparent complications Patient did tolerate procedure well. Bilateral Breath Sounds: Clear, Diminished   RT found patient's trach to be partially occluded.  Patient not using the inner cannulas or humidity causing thick dry secretions.  Sent secure chat to MD for a trach change order. Trach changed by RT x 2 without complication, good color change on the ETCO2 detector, bilateral BS, good productive cough following the trach change.  Inner cannula placed in trach, patient placed on trach collar for humidity.  Spare trach ordered with unit Network engineer.    Bayard Beaver 06/21/2022, 8:48 AM

## 2022-06-24 LAB — CULTURE, BLOOD (ROUTINE X 2)
Culture: NO GROWTH
Culture: NO GROWTH
Special Requests: ADEQUATE

## 2022-06-25 DIAGNOSIS — J0410 Acute tracheitis without obstruction: Secondary | ICD-10-CM | POA: Diagnosis not present

## 2022-06-25 DIAGNOSIS — I6522 Occlusion and stenosis of left carotid artery: Secondary | ICD-10-CM | POA: Diagnosis not present

## 2022-06-25 DIAGNOSIS — T40601A Poisoning by unspecified narcotics, accidental (unintentional), initial encounter: Secondary | ICD-10-CM | POA: Diagnosis not present

## 2022-06-25 DIAGNOSIS — R0602 Shortness of breath: Secondary | ICD-10-CM | POA: Diagnosis not present

## 2022-06-25 DIAGNOSIS — R112 Nausea with vomiting, unspecified: Secondary | ICD-10-CM | POA: Diagnosis not present

## 2022-06-25 DIAGNOSIS — C321 Malignant neoplasm of supraglottis: Secondary | ICD-10-CM | POA: Diagnosis not present

## 2022-06-25 DIAGNOSIS — M6281 Muscle weakness (generalized): Secondary | ICD-10-CM | POA: Diagnosis not present

## 2022-06-25 DIAGNOSIS — I959 Hypotension, unspecified: Secondary | ICD-10-CM | POA: Diagnosis not present

## 2022-06-25 DIAGNOSIS — K769 Liver disease, unspecified: Secondary | ICD-10-CM | POA: Diagnosis not present

## 2022-06-25 DIAGNOSIS — J9601 Acute respiratory failure with hypoxia: Secondary | ICD-10-CM | POA: Diagnosis not present

## 2022-06-25 DIAGNOSIS — R5381 Other malaise: Secondary | ICD-10-CM | POA: Diagnosis not present

## 2022-06-25 DIAGNOSIS — A419 Sepsis, unspecified organism: Secondary | ICD-10-CM | POA: Diagnosis not present

## 2022-06-25 DIAGNOSIS — C329 Malignant neoplasm of larynx, unspecified: Secondary | ICD-10-CM | POA: Diagnosis not present

## 2022-06-25 DIAGNOSIS — Z9989 Dependence on other enabling machines and devices: Secondary | ICD-10-CM | POA: Diagnosis not present

## 2022-06-25 DIAGNOSIS — R491 Aphonia: Secondary | ICD-10-CM | POA: Diagnosis not present

## 2022-06-25 DIAGNOSIS — F99 Mental disorder, not otherwise specified: Secondary | ICD-10-CM | POA: Diagnosis not present

## 2022-06-25 DIAGNOSIS — Z515 Encounter for palliative care: Secondary | ICD-10-CM | POA: Diagnosis not present

## 2022-06-25 DIAGNOSIS — R279 Unspecified lack of coordination: Secondary | ICD-10-CM | POA: Diagnosis not present

## 2022-06-25 DIAGNOSIS — J690 Pneumonitis due to inhalation of food and vomit: Secondary | ICD-10-CM | POA: Diagnosis not present

## 2022-06-25 DIAGNOSIS — R1110 Vomiting, unspecified: Secondary | ICD-10-CM | POA: Diagnosis not present

## 2022-06-25 DIAGNOSIS — K922 Gastrointestinal hemorrhage, unspecified: Secondary | ICD-10-CM | POA: Diagnosis not present

## 2022-06-25 DIAGNOSIS — R079 Chest pain, unspecified: Secondary | ICD-10-CM | POA: Diagnosis not present

## 2022-06-25 DIAGNOSIS — G893 Neoplasm related pain (acute) (chronic): Secondary | ICD-10-CM | POA: Diagnosis not present

## 2022-06-25 DIAGNOSIS — R110 Nausea: Secondary | ICD-10-CM | POA: Diagnosis not present

## 2022-06-25 DIAGNOSIS — Z66 Do not resuscitate: Secondary | ICD-10-CM | POA: Diagnosis not present

## 2022-06-25 DIAGNOSIS — E43 Unspecified severe protein-calorie malnutrition: Secondary | ICD-10-CM | POA: Diagnosis not present

## 2022-06-25 DIAGNOSIS — R1084 Generalized abdominal pain: Secondary | ICD-10-CM | POA: Diagnosis not present

## 2022-06-25 DIAGNOSIS — C760 Malignant neoplasm of head, face and neck: Secondary | ICD-10-CM | POA: Diagnosis not present

## 2022-06-25 DIAGNOSIS — K116 Mucocele of salivary gland: Secondary | ICD-10-CM | POA: Diagnosis not present

## 2022-06-25 DIAGNOSIS — I499 Cardiac arrhythmia, unspecified: Secondary | ICD-10-CM | POA: Diagnosis not present

## 2022-06-25 DIAGNOSIS — J398 Other specified diseases of upper respiratory tract: Secondary | ICD-10-CM | POA: Diagnosis not present

## 2022-06-25 DIAGNOSIS — K264 Chronic or unspecified duodenal ulcer with hemorrhage: Secondary | ICD-10-CM | POA: Diagnosis not present

## 2022-06-25 DIAGNOSIS — K521 Toxic gastroenteritis and colitis: Secondary | ICD-10-CM | POA: Diagnosis not present

## 2022-06-25 DIAGNOSIS — C770 Secondary and unspecified malignant neoplasm of lymph nodes of head, face and neck: Secondary | ICD-10-CM | POA: Diagnosis not present

## 2022-06-25 DIAGNOSIS — C109 Malignant neoplasm of oropharynx, unspecified: Secondary | ICD-10-CM | POA: Diagnosis not present

## 2022-06-25 DIAGNOSIS — R918 Other nonspecific abnormal finding of lung field: Secondary | ICD-10-CM | POA: Diagnosis not present

## 2022-06-25 DIAGNOSIS — Z23 Encounter for immunization: Secondary | ICD-10-CM | POA: Diagnosis not present

## 2022-06-25 DIAGNOSIS — I10 Essential (primary) hypertension: Secondary | ICD-10-CM | POA: Diagnosis not present

## 2022-06-25 DIAGNOSIS — I82C12 Acute embolism and thrombosis of left internal jugular vein: Secondary | ICD-10-CM | POA: Diagnosis not present

## 2022-06-26 ENCOUNTER — Inpatient Hospital Stay: Payer: Medicare HMO | Admitting: Pulmonary Disease

## 2022-06-26 DIAGNOSIS — R0602 Shortness of breath: Secondary | ICD-10-CM | POA: Diagnosis not present

## 2022-06-26 DIAGNOSIS — J0410 Acute tracheitis without obstruction: Secondary | ICD-10-CM | POA: Diagnosis not present

## 2022-06-26 DIAGNOSIS — C760 Malignant neoplasm of head, face and neck: Secondary | ICD-10-CM | POA: Diagnosis not present

## 2022-06-26 DIAGNOSIS — J398 Other specified diseases of upper respiratory tract: Secondary | ICD-10-CM | POA: Diagnosis not present

## 2022-06-26 DIAGNOSIS — G893 Neoplasm related pain (acute) (chronic): Secondary | ICD-10-CM | POA: Diagnosis not present

## 2022-06-26 DIAGNOSIS — J690 Pneumonitis due to inhalation of food and vomit: Secondary | ICD-10-CM | POA: Diagnosis not present

## 2022-06-26 DIAGNOSIS — I10 Essential (primary) hypertension: Secondary | ICD-10-CM | POA: Diagnosis not present

## 2022-06-26 NOTE — Progress Notes (Deleted)
Synopsis: Referred in 06/2022 for hospital follow up for aspiration pneumonia.  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  Had a laryngectomy in 2020  Subjective:   PATIENT ID: [name] GENDER: male DOB: 1951-04-13, MRN: [id number]   HPI  No chief complaint on file.   ***  Record review: October 2023 Hospital discharge summary reviewed where the patient was treated with Unasyn for aspiration pneumonia of the lower lobes.  Past Medical History:  Diagnosis Date   Cancer ([place])    Throat cancer 2019   ETOH abuse    Frequent urination    Glaucoma    Hepatitis C virus infection without hepatic coma    dx'ed in 11/2018   History of radiation therapy 05/12/19- 07/06/19   Larynx   Hypertension    Wears denture    upper only; lost lower denture     Family History  Problem Relation Age of Onset   Breast cancer Sister    Colon cancer Brother 10       ????   Cancer Brother      Social History   Socioeconomic History   Marital status: Single    Spouse name: Not on file   Number of children: 2   Years of education: Not on file   Highest education level: Not on file  Occupational History   Not on file  Tobacco Use   Smoking status: Former    Years: 50.00    Types: Cigarettes    Quit date: 06/01/2020    Years since quitting: 2.0   Smokeless tobacco: Never  Vaping Use   Vaping Use: Never used  Substance and Sexual Activity   Alcohol use: Yes    Alcohol/week: 3.0 standard drinks of alcohol    Types: 3 Cans of beer per week    Comment: three 12oz beer daily   Drug use: Yes    Types: Cocaine    Comment: none in 2 yrs   Sexual activity: Yes    Partners: Female  Other Topics Concern   Not on file  Social History Narrative   Patient is divorced with 2 children.   Patient is currently living with his sister.   Patient with a history of smoking a third of pack of cigarettes daily for 50 years.  Patient currently  smoking 2 to 3 cigarettes/day.   Patient has never used smokeless tobacco.   Patient with occasional use of alcohol.   Patient last used cocaine approximately 6 months ago.  Patient denies use of marijuana.   Social Determinants of Health   Financial Resource Strain: Not on file  Food Insecurity: No Food Insecurity (09/21/2020)   Hunger Vital Sign    Worried About Running Out of Food in the Last Year: Never true    Ran Out of Food in the Last Year: Never true  Transportation Needs: Unmet Transportation Needs (03/05/2022)   PRAPARE - Hydrologist (Medical): Not on file    Lack of Transportation (Non-Medical): Yes  Physical Activity: Not on file  Stress: Not on file  Social Connections: Unknown (09/21/2020)   Social Connection and Isolation Panel [NHANES]    Frequency of Communication with Friends and Family: Three times a week    Frequency of Social Gatherings with Friends and Family: More than three times a week    Attends Religious Services: Not on file    Active Member of Clubs  or Organizations: Not on file    Attends Club or Organization Meetings: Not on file    Marital Status: Not on file  Intimate Partner Violence: Not At Risk (04/27/2019)   Humiliation, Afraid, Rape, and Kick questionnaire    Fear of Current or Ex-Partner: No    Emotionally Abused: No    Physically Abused: No    Sexually Abused: No     No Known Allergies   Outpatient Medications Prior to Visit  Medication Sig Dispense Refill   amoxicillin-clavulanate (AUGMENTIN) 875-125 MG tablet Place 1 tablet into feeding tube 2 (two) times daily for 5 days. 10 tablet 0   atropine 1 % ophthalmic solution Place 1 drop into the right eye 2 (two) times a day.  (Patient not taking: Reported on 05/16/2022)     [employer] STOMACH RELIEF 525 MG/30ML suspension Place 30 mLs into feeding tube every 4 (four) hours as needed for indigestion. (Patient not taking: Reported on 05/16/2022)     gabapentin (NEURONTIN) 300  MG capsule Take 300 mg by mouth 3 (three) times daily. (Patient not taking: Reported on 05/16/2022)     LENVIMA, 10 MG DAILY DOSE, capsule Take by mouth. (Patient not taking: Reported on 05/16/2022)     loperamide (IMODIUM) 2 MG capsule Take by mouth. (Patient not taking: Reported on 05/16/2022)     losartan (COZAAR) 50 MG tablet Take 50 mg by mouth daily.     magnesium oxide (MAG-OX) 400 (240 Mg) MG tablet Take 1 tablet (400 mg total) by mouth daily. (Patient not taking: Reported on 05/16/2022) 30 tablet 1   magnesium oxide (MAG-OX) 400 MG tablet Take 1 tablet by mouth daily.     Nutritional Supplements (KATE FARMS STANDARD 1.4) LIQD 487 mLs by Enteral route 3 (three) times daily. 1.5 cartons (487 ml) [employer] 1.4 TID with 60 ml free water before and 60 ml free water after each TF bolus. Provide an additional 120 ml free water TID. (Patient not taking: Reported on 05/16/2022)     ondansetron (ZOFRAN) 8 MG tablet Take 1 tablet (8 mg total) by mouth 2 (two) times daily as needed for refractory nausea / vomiting. Starting on day 3 after chemo. (Patient not taking: Reported on 05/16/2022) 30 tablet 1   oxybutynin (DITROPAN) 5 MG tablet SMARTSIG:1 Tablet(s) Gastro Tube Twice Daily PRN (Patient not taking: Reported on 05/16/2022)     oxyCODONE (OXY IR/ROXICODONE) 5 MG immediate release tablet Take 1 tablet (5 mg total) by mouth every 6 (six) hours as needed for severe pain. 90 tablet 0   pantoprazole (PROTONIX) 40 MG tablet Take 1 tablet (40 mg total) by mouth 2 (two) times daily. Take Protonix 40 mg p.o. twice daily for 8 weeks till July 01, 2022 then changed to 40 mg once daily indefinitely 84 tablet 6   prednisoLONE acetate (PRED FORTE) 1 % ophthalmic suspension Place 1 drop into the right eye 4 (four) times daily. (Patient not taking: Reported on 05/16/2022)     prochlorperazine (COMPAZINE) 10 MG tablet Take 1 tablet (10 mg total) by mouth every 6 (six) hours as needed (Nausea or vomiting). (Patient not taking:  Reported on 05/16/2022) 30 tablet 1   tiZANidine (ZANAFLEX) 4 MG tablet Take 4 mg by mouth 2 (two) times daily as needed. (Patient not taking: Reported on 05/16/2022)     Vitamin D, Ergocalciferol, (DRISDOL) 1.25 MG (50000 UNIT) CAPS capsule Take 50,000 Units by mouth every 7 (seven) days. (Patient not taking: Reported on 05/16/2022)  Water For Irrigation, Sterile (FREE WATER) SOLN Place 100 mLs into feeding tube every 4 (four) hours. (Patient not taking: Reported on 05/16/2022)     Facility-Administered Medications Prior to Visit  Medication Dose Route Frequency Provider Last Rate Last Admin   heparin lock flush 100 unit/mL  500 Units Intracatheter Once Iruku, Praveena, MD       sodium chloride flush (NS) 0.9 % injection 10 mL  10 mL Intracatheter Once Iruku, Arletha Pili, MD        ROS    Objective:  Physical Exam   There were no vitals filed for this visit.  ***  CBC    Component Value Date/Time   WBC 11.1 (H) 06/21/2022 0417   RBC 2.83 (L) 06/21/2022 0417   HGB 8.6 (L) 06/21/2022 0417   HGB 10.2 (L) 11/01/2021 1157   HCT 26.5 (L) 06/21/2022 0417   PLT 276 06/21/2022 0417   PLT 222 11/01/2021 1157   MCV 93.6 06/21/2022 0417   MCH 30.4 06/21/2022 0417   MCHC 32.5 06/21/2022 0417   RDW 15.3 06/21/2022 0417   LYMPHSABS 0.3 (L) 06/19/2022 0514   MONOABS 0.3 06/19/2022 0514   EOSABS 0.0 06/19/2022 0514   BASOSABS 0.0 06/19/2022 0514     Chest imaging: October 2023 1 view chest x-ray emphysema, tracheostomy, port in place, increased interstitial opacities in bases of lungs  PFT:  Labs:  Path:  Echo:  Heart Catheterization:       Assessment & Plan:   No diagnosis found.  Discussion: ***  Immunizations: Immunization History  Administered Date(s) Administered   Fluad Quad(high Dose 65+) 09/10/2020, 06/20/2021   Hep A / Hep B 04/06/2019, 05/09/2019   PFIZER(Purple Top)SARS-COV-2 Vaccination 09/10/2020   Tdap 05/31/2018     Current Outpatient Medications:     amoxicillin-clavulanate (AUGMENTIN) 875-125 MG tablet, Place 1 tablet into feeding tube 2 (two) times daily for 5 days., Disp: 10 tablet, Rfl: 0   atropine 1 % ophthalmic solution, Place 1 drop into the right eye 2 (two) times a day.  (Patient not taking: Reported on 05/16/2022), Disp: , Rfl:    [employer] STOMACH RELIEF 525 MG/30ML suspension, Place 30 mLs into feeding tube every 4 (four) hours as needed for indigestion. (Patient not taking: Reported on 05/16/2022), Disp: , Rfl:    gabapentin (NEURONTIN) 300 MG capsule, Take 300 mg by mouth 3 (three) times daily. (Patient not taking: Reported on 05/16/2022), Disp: , Rfl:    LENVIMA, 10 MG DAILY DOSE, capsule, Take by mouth. (Patient not taking: Reported on 05/16/2022), Disp: , Rfl:    loperamide (IMODIUM) 2 MG capsule, Take by mouth. (Patient not taking: Reported on 05/16/2022), Disp: , Rfl:    losartan (COZAAR) 50 MG tablet, Take 50 mg by mouth daily., Disp: , Rfl:    magnesium oxide (MAG-OX) 400 (240 Mg) MG tablet, Take 1 tablet (400 mg total) by mouth daily. (Patient not taking: Reported on 05/16/2022), Disp: 30 tablet, Rfl: 1   magnesium oxide (MAG-OX) 400 MG tablet, Take 1 tablet by mouth daily., Disp: , Rfl:    Nutritional Supplements (K[employer] STANDARD 1.4) LIQD, 487 mLs by Enteral route 3 (three) times daily. 1.5 cartons (487 ml) [employer] 1.4 TID with 60 ml free water before and 60 ml free water after each TF bolus. Provide an additional 120 ml free water TID. (Patient not taking: Reported on 05/16/2022), Disp: , Rfl:    ondansetron (ZOFRAN) 8 MG tablet, Take 1 tablet (8 mg total) by mouth  2 (two) times daily as needed for refractory nausea / vomiting. Starting on day 3 after chemo. (Patient not taking: Reported on 05/16/2022), Disp: 30 tablet, Rfl: 1   oxybutynin (DITROPAN) 5 MG tablet, SMARTSIG:1 Tablet(s) Gastro Tube Twice Daily PRN (Patient not taking: Reported on 05/16/2022), Disp: , Rfl:    oxyCODONE (OXY IR/ROXICODONE) 5 MG immediate release tablet, Take  1 tablet (5 mg total) by mouth every 6 (six) hours as needed for severe pain., Disp: 90 tablet, Rfl: 0   pantoprazole (PROTONIX) 40 MG tablet, Take 1 tablet (40 mg total) by mouth 2 (two) times daily. Take Protonix 40 mg p.o. twice daily for 8 weeks till July 01, 2022 then changed to 40 mg once daily indefinitely, Disp: 84 tablet, Rfl: 6   prednisoLONE acetate (PRED FORTE) 1 % ophthalmic suspension, Place 1 drop into the right eye 4 (four) times daily. (Patient not taking: Reported on 05/16/2022), Disp: , Rfl:    prochlorperazine (COMPAZINE) 10 MG tablet, Take 1 tablet (10 mg total) by mouth every 6 (six) hours as needed (Nausea or vomiting). (Patient not taking: Reported on 05/16/2022), Disp: 30 tablet, Rfl: 1   tiZANidine (ZANAFLEX) 4 MG tablet, Take 4 mg by mouth 2 (two) times daily as needed. (Patient not taking: Reported on 05/16/2022), Disp: , Rfl:    Vitamin D, Ergocalciferol, (DRISDOL) 1.25 MG (50000 UNIT) CAPS capsule, Take 50,000 Units by mouth every 7 (seven) days. (Patient not taking: Reported on 05/16/2022), Disp: , Rfl:    Water For Irrigation, Sterile (FREE WATER) SOLN, Place 100 mLs into feeding tube every 4 (four) hours. (Patient not taking: Reported on 05/16/2022), Disp: , Rfl:  No current facility-administered medications for this visit.  Facility-Administered Medications Ordered in Other Visits:    heparin lock flush 100 unit/mL, 500 Units, Intracatheter, Once, Iruku, Praveena, MD   sodium chloride flush (NS) 0.9 % injection 10 mL, 10 mL, Intracatheter, Once, [name], MD

## 2022-06-27 DIAGNOSIS — R0602 Shortness of breath: Secondary | ICD-10-CM | POA: Diagnosis not present

## 2022-06-27 DIAGNOSIS — R112 Nausea with vomiting, unspecified: Secondary | ICD-10-CM | POA: Diagnosis not present

## 2022-06-27 DIAGNOSIS — C329 Malignant neoplasm of larynx, unspecified: Secondary | ICD-10-CM | POA: Diagnosis not present

## 2022-06-27 DIAGNOSIS — G893 Neoplasm related pain (acute) (chronic): Secondary | ICD-10-CM | POA: Diagnosis not present

## 2022-06-27 DIAGNOSIS — R079 Chest pain, unspecified: Secondary | ICD-10-CM | POA: Diagnosis not present

## 2022-06-27 DIAGNOSIS — K521 Toxic gastroenteritis and colitis: Secondary | ICD-10-CM | POA: Diagnosis not present

## 2022-06-27 DIAGNOSIS — R491 Aphonia: Secondary | ICD-10-CM | POA: Diagnosis not present

## 2022-06-27 DIAGNOSIS — R1084 Generalized abdominal pain: Secondary | ICD-10-CM | POA: Diagnosis not present

## 2022-06-27 DIAGNOSIS — I10 Essential (primary) hypertension: Secondary | ICD-10-CM | POA: Diagnosis not present

## 2022-06-28 DIAGNOSIS — R491 Aphonia: Secondary | ICD-10-CM | POA: Diagnosis not present

## 2022-06-29 DIAGNOSIS — R491 Aphonia: Secondary | ICD-10-CM | POA: Diagnosis not present

## 2022-06-30 ENCOUNTER — Ambulatory Visit: Payer: Medicaid Other | Admitting: Internal Medicine

## 2022-06-30 DIAGNOSIS — R491 Aphonia: Secondary | ICD-10-CM | POA: Diagnosis not present

## 2022-06-30 NOTE — Progress Notes (Signed)
[facility] Consult Note Telephone: [phone]  Fax: [phone]   Date of encounter: 06/30/22 2:32 PM PATIENT NAME: [name] [address] [place] [address]   [phone] (home)  DOB: 1951/06/17 MRN: [id number] PRIMARY CARE PROVIDER:    [name], MD,  [name] [address] [phone]  REFERRING PROVIDER:   [name], MD [address] [place],  [place] [address] [phone]  RESPONSIBLE PARTY:    Contact Information     Name Relation Home Work Mobile   [name] Sister [phone]  [phone]   [place],(Caregiver)[name] Other [phone]  [phone]        Due to the COVID-19 crisis, this visit was done via telemedicine from my office and it was initiated and consent by this patient and or family.  I connected with  [name] OR PROXY on 06/30/22 by a telemedicine application and verified that I am speaking with the correct person using two identifiers.  This patient did not have the ability to connect via a video-enabled capacity.   I discussed the limitations of evaluation and management by telemedicine. The patient expressed understanding and agreed to proceed.  Palliative Care was asked to follow this patient by consultation request of  Alan Ebbs, MD to address advance care planning and complex medical decision making. This is the initial visit.                                     ASSESSMENT AND PLAN / RECOMMENDATIONS:   Advance Care Planning/Goals of Care: Goals include to maximize quality of life and symptom management. Patient/health care surrogate gave his/her permission to discuss.Our advance care planning conversation included a discussion about:    The value and importance of advance care planning  Experiences with loved ones who have been seriously ill or have died  Exploration of personal, cultural or spiritual beliefs that might influence  medical decisions  Exploration of goals of care in the event of a sudden injury or illness  Identification  of a healthcare agent  Review and updating or creation of an  advance directive document . Decision not to resuscitate or to de-escalate disease focused treatments due to poor prognosis. CODE STATUS: Has been full code Hospice care has been recommended  Caregiver [name] [phone] and sister [name] [phone] [facility])  Symptom Management/Plan: 1. Laryngeal cancer ([place]) -pt with progressing cancer despite his prior chemo treatments--no longer eligible for any more therapies -hospice has been recommended during current admission at [facility] for trach obstruction -team there has given a prognosis of "weeks" and [name], case Freight forwarder at [facility] with whom I spoke [phone]) is going to clarify that with team b/c he could be IPU eligible if he has less than 2 weeks -he requires trach and PEG care   2. Aspiration pneumonia due to gastric secretions, unspecified laterality, unspecified part of lung ([place]) -just had admission 10/4-7 at [facility] with this and then d/c'd home, but then had trach obstruction 10/11 and went to [facility]  3. Duodenal ulcer with hemorrhage -prevented further chemo tablets  4. Tracheostomy present [facility]) -family not comfortable caring for trach at home and request either SNF or IPU care  5. PEG (percutaneous endoscopic gastrostomy) status ([place]) -in place and receiving feedings only  6. Palliative care by specialist -educated sister and caregiver about palliative vs hospice -wound up speaking with case manager at [facility] who  happened to enter during visit -discussed hospice at home vs snf vs [place] was checking with primary team about prognosis/current function to see if IPU here in [place] would be option vs moving his medicaid to long-term care so he could be admitted there as he really could not do therapy vs home which family is not comfortable doing at this point due to  readmissions re: trach obstruction/this care--he would need 24x7 care plus hospice at home -[name] was provided my contact info and told about two IPUs we have   Not billable as pt in hospital at this time.  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  He was initally dx'd in 04/2019.  He had chemo and radiation.  He then had two other chemos but CTs in 3 and 6/23 showed progression.  He had his tracheostomy in April 2023 and is also PEG-dependent.  Palliative chemo had been recommended but he was not ready for hospice on 06/13/22.  He had a GI bleed secondary to duodenal ulcer which also prevented him from being eligible for oral chemo.  10/4, he was hospitalized at [facility] with aspiration pneumonia and then 10/11, he went to [facility] with trach obstruction.   [name], his sister, is aware he's not getting any better, he can't get any txs and the tumor just keeps growing.  The trach keeps getting clogged and he's unable to breathe.  He also had an ulcer at one time.  He would have to have that healed to get chemo.  A cancer pill was no longer an option either.  All meds and food are through his peg tube.   Had an episode of aspiration Wt 89 lbs 10/7 with BMI 13.67, 4 mos ago, he weighed 103 lbs Before he went to the hospital, he was walking w/o assistive device.   He dressed himself, but did need some help with a bath due to unsteadiness.   History obtained from review of EMR, discussion with primary team, and interview with family, facility staff/caregiver and/or [name].   I reviewed available labs, medications, imaging, studies  and related documents from the EMR.  Records reviewed and summarized above.    Wt Readings from Last 500 Encounters:  06/21/22 89 lb 14.4 oz (40.8 kg)  05/27/22 93 lb 6.4 oz (42.4 kg)  05/19/22 96 lb 9.6 oz (43.8 kg)  05/02/22 96 lb 8 oz (43.8 kg)  05/01/22 100 lb (45.4 kg)  03/11/22 100 lb 2 oz (45.4 kg)  03/07/22 103 lb (46.7 kg)  02/25/22 101 lb 3.2 oz (45.9 kg)  02/18/22 99 lb 4 oz (45 kg)  02/11/22 104 lb (47.2 kg)  02/04/22 100 lb 8 oz (45.6 kg)  01/27/22 101 lb 14.4 oz (46.2 kg)  01/13/22 102 lb 12.8 oz (46.6 kg)  01/06/22 108 lb 3.2 oz (49.1 kg)  12/17/21 103 lb 14.4 oz (47.1 kg)  12/03/21 112 lb (50.8 kg)  11/13/21 102 lb 1.6 oz (46.3 kg)  11/08/21 103 lb (46.7 kg)  11/01/21 108 lb 8 oz (49.2 kg)  10/25/21 104 lb 12 oz (47.5 kg)  10/18/21 111 lb 12 oz (50.7 kg)  10/11/21  107 lb 3.2 oz (48.6 kg)  10/04/21 103 lb (46.7 kg)  09/30/21 106 lb (48.1 kg)  09/26/21 106 lb 12.8 oz (48.4 kg)  09/20/21 106 lb 6.4 oz (48.3 kg)  08/30/21 106 lb 14.4 oz (48.5 kg)  08/12/21 101 lb 8 oz (46 kg)  08/05/21 107 lb 8 oz (48.8 kg)  07/29/21 108 lb (49 kg)  07/25/21 106 lb 12 oz (48.4 kg)  07/23/21 104 lb 3.2 oz (47.3 kg)  07/11/21 103 lb 6 oz (46.9 kg)  07/04/21 108 lb 11.2 oz (49.3 kg)  06/27/21 108 lb 14.4 oz (49.4 kg)  06/20/21 104 lb 7 oz (47.4 kg)  06/13/21 108 lb 4 oz (49.1 kg)  05/16/21 108 lb 2 oz (49 kg)  05/01/21 103 lb 9 oz (47 kg)  04/22/21 104 lb 11.5 oz (47.5 kg)  03/26/21 111 lb 8 oz (50.6 kg)  03/14/21 109 lb 12.6 oz (49.8 kg)  03/05/21 113 lb 3.2 oz (51.3 kg)  02/27/21 112 lb (50.8 kg)  02/21/21 116 lb 6.4 oz (52.8 kg)  02/01/21 116 lb 6 oz (52.8 kg)  01/24/21 120 lb (54.4 kg)  01/10/21 122 lb 4.8 oz (55.5 kg)  12/25/20 118 lb 9.6 oz (53.8 kg)  11/20/20 130 lb 8 oz (59.2 kg)  10/30/20 130 lb 11.2 oz (59.3 kg)  10/23/20 131 lb (59.4 kg)  10/16/20 132 lb (59.9 kg)  10/09/20 131 lb (59.4 kg)  10/02/20 132 lb 4.8 oz (60 kg)  09/25/20 128 lb 12.8 oz (58.4  kg)  09/24/20 130 lb (59 kg)  09/10/20 131 lb 8 oz (59.6 kg)  08/31/20 131 lb 6 oz (59.6 kg)  08/13/20 129 lb 10.1 oz (58.8 kg)  04/30/20 137 lb (62.1 kg)  01/31/20 139 lb 3.2 oz (63.1 kg)  01/06/20 141 lb 6.4 oz (64.1 kg)  01/03/20 141 lb 6.4 oz (64.1 kg)  11/10/19 140 lb (63.5 kg)  07/22/19 137 lb 12.8 oz (62.5 kg)  05/04/19 139 lb 1.6 oz (63.1 kg)  04/06/19 133 lb (60.3 kg)  04/01/19 120 lb (54.4 kg)  03/08/19 144 lb (65.3 kg)  03/07/19 139 lb (63 kg)  11/30/18 139 lb (63 kg)  06/08/18 144 lb (65.3 kg)  08/22/17 137 lb (62.1 kg)  12/07/14 137 lb (62.1 kg)    CURRENT PROBLEM LIST:  Patient Active Problem List   Diagnosis Date Noted   Aspiration pneumonia ([place]) 06/19/2022   GERD (gastroesophageal reflux disease) 06/19/2022   Anemia of chronic disease 06/19/2022   Duodenal ulcer with hemorrhage    Acute on chronic blood loss anemia 05/14/2022   Hypertension    Tracheostomy present ([place])    Alcohol use    Goals of care, counseling/discussion 10/11/2021   Pneumonitis 09/23/2021   Hypomagnesemia 08/05/2021   Dysphagia 07/24/2021   Neuropathy due to chemotherapeutic drug ([place]) 07/04/2021   Weight loss, unintentional 06/20/2021   Protein-calorie malnutrition, severe 04/17/2021   Perforated gastric ulcer ([place])    Gastrostomy tube dysfunction ([place]) 04/12/2021   Anemia due to antineoplastic chemotherapy 03/26/2021   Cancer related pain 03/26/2021   Laryngeal edema 03/11/2021   Nausea and vomiting 01/24/2021   Shortness of breath 01/10/2021   Mucositis due to antineoplastic therapy 01/10/2021   G tube feedings ([place]) 01/10/2021   Chemotherapy induced diarrhea 10/30/2020   Port-A-Cath in place 10/23/2020   Chronic hepatitis C without hepatic coma ([place]) 06/09/2019   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[place]) 05/02/2019   PAST MEDICAL HISTORY:  Active  Ambulatory Problems    Diagnosis Date Noted   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[place])  05/02/2019   Chronic hepatitis C without hepatic coma ([employer]) 06/09/2019   Port-A-Cath in place 10/23/2020   Chemotherapy induced diarrhea 10/30/2020   Shortness of breath 01/10/2021   Mucositis due to antineoplastic therapy 01/10/2021   G tube feedings ([place]) 01/10/2021   Nausea and vomiting 01/24/2021   Laryngeal edema 03/11/2021   Anemia due to antineoplastic chemotherapy 03/26/2021   Cancer related pain 03/26/2021   Gastrostomy tube dysfunction ([place]) 04/12/2021   Protein-calorie malnutrition, severe 04/17/2021   Perforated gastric ulcer ([place])    Weight loss, unintentional 06/20/2021   Neuropathy due to chemotherapeutic drug ([place]) 07/04/2021   Dysphagia 07/24/2021   Hypomagnesemia 08/05/2021   Pneumonitis 09/23/2021   Goals of care, counseling/discussion 10/11/2021   Acute on chronic blood loss anemia 05/14/2022   Hypertension    Tracheostomy present ([place])    Alcohol use    Duodenal ulcer with hemorrhage    Aspiration pneumonia ([place]) 06/19/2022   GERD (gastroesophageal reflux disease) 06/19/2022   Anemia of chronic disease 06/19/2022   Resolved Ambulatory Problems    Diagnosis Date Noted   No Resolved Ambulatory Problems   Past Medical History:  Diagnosis Date   Cancer ([place])    ETOH abuse    Frequent urination    Glaucoma    Hepatitis C virus infection without hepatic coma    History of radiation therapy 05/12/19- 07/06/19   Wears denture    SOCIAL HX:  Social History   Tobacco Use   Smoking status: Former    Years: 50.00    Types: Cigarettes    Quit date: 06/01/2020    Years since quitting: 2.0   Smokeless tobacco: Never  Substance Use Topics   Alcohol use: Yes    Alcohol/week: 3.0 standard drinks of alcohol    Types: 3 Cans of beer per week    Comment: three 12oz beer daily   FAMILY HX:  Family History  Problem Relation Age of Onset   Breast cancer Sister    Colon cancer Brother 8       ????   Cancer Brother       ALLERGIES: No Known Allergies     PERTINENT MEDICATIONS:  Outpatient Encounter Medications as of 06/30/2022  Medication Sig   atropine 1 % ophthalmic solution Place 1 drop into the right eye 2 (two) times a day.  (Patient not taking: Reported on 05/16/2022)   [employer] STOMACH RELIEF 525 MG/30ML suspension Place 30 mLs into feeding tube every 4 (four) hours as needed for indigestion. (Patient not taking: Reported on 05/16/2022)   gabapentin (NEURONTIN) 300 MG capsule Take 300 mg by mouth 3 (three) times daily. (Patient not taking: Reported on 05/16/2022)   LENVIMA, 10 MG DAILY DOSE, capsule Take by mouth. (Patient not taking: Reported on 05/16/2022)   loperamide (IMODIUM) 2 MG capsule Take by mouth. (Patient not taking: Reported on 05/16/2022)   losartan (COZAAR) 50 MG tablet Take 50 mg by mouth daily.   magnesium oxide (MAG-OX) 400 (240 Mg) MG tablet Take 1 tablet (400 mg total) by mouth daily. (Patient not taking: Reported on 05/16/2022)   magnesium oxide (MAG-OX) 400 MG tablet Take 1 tablet by mouth daily.   Nutritional Supplements (KATE FARMS STANDARD 1.4) LIQD 487 mLs by Enteral route 3 (three) times daily. 1.5 cartons (487 ml) [employer] 1.4 TID with 60 ml free water before and 60 ml free water  after each TF bolus. Provide an additional 120 ml free water TID. (Patient not taking: Reported on 05/16/2022)   ondansetron (ZOFRAN) 8 MG tablet Take 1 tablet (8 mg total) by mouth 2 (two) times daily as needed for refractory nausea / vomiting. Starting on day 3 after chemo. (Patient not taking: Reported on 05/16/2022)   oxybutynin (DITROPAN) 5 MG tablet SMARTSIG:1 Tablet(s) Gastro Tube Twice Daily PRN (Patient not taking: Reported on 05/16/2022)   oxyCODONE (OXY IR/ROXICODONE) 5 MG immediate release tablet Take 1 tablet (5 mg total) by mouth every 6 (six) hours as needed for severe pain.   pantoprazole (PROTONIX) 40 MG tablet Take 1 tablet (40 mg total) by mouth 2 (two) times daily. Take Protonix 40 mg p.o. twice daily for 8 weeks till July 01, 2022  then changed to 40 mg once daily indefinitely   prednisoLONE acetate (PRED FORTE) 1 % ophthalmic suspension Place 1 drop into the right eye 4 (four) times daily. (Patient not taking: Reported on 05/16/2022)   prochlorperazine (COMPAZINE) 10 MG tablet Take 1 tablet (10 mg total) by mouth every 6 (six) hours as needed (Nausea or vomiting). (Patient not taking: Reported on 05/16/2022)   tiZANidine (ZANAFLEX) 4 MG tablet Take 4 mg by mouth 2 (two) times daily as needed. (Patient not taking: Reported on 05/16/2022)   Vitamin D, Ergocalciferol, (DRISDOL) 1.25 MG (50000 UNIT) CAPS capsule Take 50,000 Units by mouth every 7 (seven) days. (Patient not taking: Reported on 05/16/2022)   Water For Irrigation, Sterile (FREE WATER) SOLN Place 100 mLs into feeding tube every 4 (four) hours. (Patient not taking: Reported on 05/16/2022)   Facility-Administered Encounter Medications as of 06/30/2022  Medication   heparin lock flush 100 unit/mL   sodium chloride flush (NS) 0.9 % injection 10 mL    Thank you for the opportunity to participate in the care of Mr. Defino.  The palliative care team will continue to follow. Please call our office at [phone] if we can be of additional assistance.   Hollace Kinnier, DO  COVID-19 PATIENT SCREENING TOOL Asked and negative response unless otherwise noted:  Have you had symptoms of covid, tested positive or been in contact with someone with symptoms/positive test in the past 5-10 days?  NO

## 2022-07-01 ENCOUNTER — Encounter (HOSPITAL_COMMUNITY): Payer: Self-pay

## 2022-07-01 DIAGNOSIS — R491 Aphonia: Secondary | ICD-10-CM | POA: Diagnosis not present

## 2022-07-02 DIAGNOSIS — R491 Aphonia: Secondary | ICD-10-CM | POA: Diagnosis not present

## 2022-07-03 DIAGNOSIS — R491 Aphonia: Secondary | ICD-10-CM | POA: Diagnosis not present

## 2022-07-04 ENCOUNTER — Inpatient Hospital Stay: Payer: Medicare HMO | Admitting: Pulmonary Disease

## 2022-07-04 NOTE — Progress Notes (Deleted)
Synopsis: Referred in 06/2022 for hospital follow up for aspiration pneumonia.  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  Had a laryngectomy in 2020  Subjective:   PATIENT ID: [name] GENDER: male DOB: [birth year], MRN: [id number]   HPI  No chief complaint on file.   ***  Record review: October 2023 Hospital discharge summary reviewed where the patient was treated with Unasyn for aspiration pneumonia of the lower lobes.  Past Medical History:  Diagnosis Date   Cancer ([place])    Throat cancer 2019   ETOH abuse    Frequent urination    Glaucoma    Hepatitis C virus infection without hepatic coma    dx'ed in 11/2018   History of radiation therapy 05/12/19- 07/06/19   Larynx   Hypertension    Wears denture    upper only; lost lower denture     Family History  Problem Relation Age of Onset   Breast cancer Sister    Colon cancer Brother 29       ????   Cancer Brother      Social History   Socioeconomic History   Marital status: Single    Spouse name: Not on file   Number of children: 2   Years of education: Not on file   Highest education level: Not on file  Occupational History   Not on file  Tobacco Use   Smoking status: Former    Years: 50.00    Types: Cigarettes    Quit date: 06/01/2020    Years since quitting: 2.0   Smokeless tobacco: Never  Vaping Use   Vaping Use: Never used  Substance and Sexual Activity   Alcohol use: Yes    Alcohol/week: 3.0 standard drinks of alcohol    Types: 3 Cans of beer per week    Comment: three 12oz beer daily   Drug use: Yes    Types: Cocaine    Comment: none in 2 yrs   Sexual activity: Yes    Partners: Female  Other Topics Concern   Not on file  Social History Narrative   Patient is divorced with 2 children.   Patient is currently living with his sister.   Patient with a history of smoking a third of pack of cigarettes daily for 50 years.  Patient currently  smoking 2 to 3 cigarettes/day.   Patient has never used smokeless tobacco.   Patient with occasional use of alcohol.   Patient last used cocaine approximately 6 months ago.  Patient denies use of marijuana.   Social Determinants of Health   Financial Resource Strain: Not on file  Food Insecurity: No Food Insecurity (09/21/2020)   Hunger Vital Sign    Worried About Running Out of Food in the Last Year: Never true    Ran Out of Food in the Last Year: Never true  Transportation Needs: Unmet Transportation Needs (03/05/2022)   PRAPARE - Hydrologist (Medical): Not on file    Lack of Transportation (Non-Medical): Yes  Physical Activity: Not on file  Stress: Not on file  Social Connections: Unknown (09/21/2020)   Social Connection and Isolation Panel [NHANES]    Frequency of Communication with Friends and Family: Three times a week    Frequency of Social Gatherings with Friends and Family: More than three times a week    Attends Religious Services: Not on file    Active Member of Clubs  or Organizations: Not on file    Attends Club or Organization Meetings: Not on file    Marital Status: Not on file  Intimate Partner Violence: Not At Risk (04/27/2019)   Humiliation, Afraid, Rape, and Kick questionnaire    Fear of Current or Ex-Partner: No    Emotionally Abused: No    Physically Abused: No    Sexually Abused: No     No Known Allergies   Outpatient Medications Prior to Visit  Medication Sig Dispense Refill   atropine 1 % ophthalmic solution Place 1 drop into the right eye 2 (two) times a day.  (Patient not taking: Reported on 05/16/2022)     [employer] STOMACH RELIEF 525 MG/30ML suspension Place 30 mLs into feeding tube every 4 (four) hours as needed for indigestion. (Patient not taking: Reported on 05/16/2022)     gabapentin (NEURONTIN) 300 MG capsule Take 300 mg by mouth 3 (three) times daily. (Patient not taking: Reported on 05/16/2022)     LENVIMA, 10 MG DAILY DOSE,  capsule Take by mouth. (Patient not taking: Reported on 05/16/2022)     loperamide (IMODIUM) 2 MG capsule Take by mouth. (Patient not taking: Reported on 05/16/2022)     losartan (COZAAR) 50 MG tablet Take 50 mg by mouth daily.     magnesium oxide (MAG-OX) 400 (240 Mg) MG tablet Take 1 tablet (400 mg total) by mouth daily. (Patient not taking: Reported on 05/16/2022) 30 tablet 1   magnesium oxide (MAG-OX) 400 MG tablet Take 1 tablet by mouth daily.     Nutritional Supplements (KATE FARMS STANDARD 1.4) LIQD 487 mLs by Enteral route 3 (three) times daily. 1.5 cartons (487 ml) [employer] 1.4 TID with 60 ml free water before and 60 ml free water after each TF bolus. Provide an additional 120 ml free water TID. (Patient not taking: Reported on 05/16/2022)     ondansetron (ZOFRAN) 8 MG tablet Take 1 tablet (8 mg total) by mouth 2 (two) times daily as needed for refractory nausea / vomiting. Starting on day 3 after chemo. (Patient not taking: Reported on 05/16/2022) 30 tablet 1   oxybutynin (DITROPAN) 5 MG tablet SMARTSIG:1 Tablet(s) Gastro Tube Twice Daily PRN (Patient not taking: Reported on 05/16/2022)     oxyCODONE (OXY IR/ROXICODONE) 5 MG immediate release tablet Take 1 tablet (5 mg total) by mouth every 6 (six) hours as needed for severe pain. 90 tablet 0   pantoprazole (PROTONIX) 40 MG tablet Take 1 tablet (40 mg total) by mouth 2 (two) times daily. Take Protonix 40 mg p.o. twice daily for 8 weeks till July 01, 2022 then changed to 40 mg once daily indefinitely 84 tablet 6   prednisoLONE acetate (PRED FORTE) 1 % ophthalmic suspension Place 1 drop into the right eye 4 (four) times daily. (Patient not taking: Reported on 05/16/2022)     prochlorperazine (COMPAZINE) 10 MG tablet Take 1 tablet (10 mg total) by mouth every 6 (six) hours as needed (Nausea or vomiting). (Patient not taking: Reported on 05/16/2022) 30 tablet 1   tiZANidine (ZANAFLEX) 4 MG tablet Take 4 mg by mouth 2 (two) times daily as needed. (Patient not  taking: Reported on 05/16/2022)     Vitamin D, Ergocalciferol, (DRISDOL) 1.25 MG (50000 UNIT) CAPS capsule Take 50,000 Units by mouth every 7 (seven) days. (Patient not taking: Reported on 05/16/2022)     Water For Irrigation, Sterile (FREE WATER) SOLN Place 100 mLs into feeding tube every 4 (four) hours. (Patient not taking: Reported  on 05/16/2022)     Facility-Administered Medications Prior to Visit  Medication Dose Route Frequency Provider Last Rate Last Admin   heparin lock flush 100 unit/mL  500 Units Intracatheter Once Iruku, Praveena, MD       sodium chloride flush (NS) 0.9 % injection 10 mL  10 mL Intracatheter Once Iruku, Arletha Pili, MD        ROS    Objective:  Physical Exam   There were no vitals filed for this visit.  ***  CBC    Component Value Date/Time   WBC 11.1 (H) 06/21/2022 0417   RBC 2.83 (L) 06/21/2022 0417   HGB 8.6 (L) 06/21/2022 0417   HGB 10.2 (L) 11/01/2021 1157   HCT 26.5 (L) 06/21/2022 0417   PLT 276 06/21/2022 0417   PLT 222 11/01/2021 1157   MCV 93.6 06/21/2022 0417   MCH 30.4 06/21/2022 0417   MCHC 32.5 06/21/2022 0417   RDW 15.3 06/21/2022 0417   LYMPHSABS 0.3 (L) 06/19/2022 0514   MONOABS 0.3 06/19/2022 0514   EOSABS 0.0 06/19/2022 0514   BASOSABS 0.0 06/19/2022 0514     Chest imaging: October 2023 1 view chest x-ray emphysema, tracheostomy, port in place, increased interstitial opacities in bases of lungs  PFT:  Labs:  Path:  Echo:  Heart Catheterization:       Assessment & Plan:   No diagnosis found.  Discussion: ***  Immunizations: Immunization History  Administered Date(s) Administered   Fluad Quad(high Dose 65+) 09/10/2020, 06/20/2021   Hep A / Hep B 04/06/2019, 05/09/2019   PFIZER(Purple Top)SARS-COV-2 Vaccination 09/10/2020   Tdap 05/31/2018     Current Outpatient Medications:    atropine 1 % ophthalmic solution, Place 1 drop into the right eye 2 (two) times a day.  (Patient not taking: Reported on 05/16/2022),  Disp: , Rfl:    [employer] STOMACH RELIEF 525 MG/30ML suspension, Place 30 mLs into feeding tube every 4 (four) hours as needed for indigestion. (Patient not taking: Reported on 05/16/2022), Disp: , Rfl:    gabapentin (NEURONTIN) 300 MG capsule, Take 300 mg by mouth 3 (three) times daily. (Patient not taking: Reported on 05/16/2022), Disp: , Rfl:    LENVIMA, 10 MG DAILY DOSE, capsule, Take by mouth. (Patient not taking: Reported on 05/16/2022), Disp: , Rfl:    loperamide (IMODIUM) 2 MG capsule, Take by mouth. (Patient not taking: Reported on 05/16/2022), Disp: , Rfl:    losartan (COZAAR) 50 MG tablet, Take 50 mg by mouth daily., Disp: , Rfl:    magnesium oxide (MAG-OX) 400 (240 Mg) MG tablet, Take 1 tablet (400 mg total) by mouth daily. (Patient not taking: Reported on 05/16/2022), Disp: 30 tablet, Rfl: 1   magnesium oxide (MAG-OX) 400 MG tablet, Take 1 tablet by mouth daily., Disp: , Rfl:    Nutritional Supplements (KATE FARMS STANDARD 1.4) LIQD, 487 mLs by Enteral route 3 (three) times daily. 1.5 cartons (487 ml) [employer] 1.4 TID with 60 ml free water before and 60 ml free water after each TF bolus. Provide an additional 120 ml free water TID. (Patient not taking: Reported on 05/16/2022), Disp: , Rfl:    ondansetron (ZOFRAN) 8 MG tablet, Take 1 tablet (8 mg total) by mouth 2 (two) times daily as needed for refractory nausea / vomiting. Starting on day 3 after chemo. (Patient not taking: Reported on 05/16/2022), Disp: 30 tablet, Rfl: 1   oxybutynin (DITROPAN) 5 MG tablet, SMARTSIG:1 Tablet(s) Gastro Tube Twice Daily PRN (Patient not taking: Reported  on 05/16/2022), Disp: , Rfl:    oxyCODONE (OXY IR/ROXICODONE) 5 MG immediate release tablet, Take 1 tablet (5 mg total) by mouth every 6 (six) hours as needed for severe pain., Disp: 90 tablet, Rfl: 0   pantoprazole (PROTONIX) 40 MG tablet, Take 1 tablet (40 mg total) by mouth 2 (two) times daily. Take Protonix 40 mg p.o. twice daily for 8 weeks till July 01, 2022 then  changed to 40 mg once daily indefinitely, Disp: 84 tablet, Rfl: 6   prednisoLONE acetate (PRED FORTE) 1 % ophthalmic suspension, Place 1 drop into the right eye 4 (four) times daily. (Patient not taking: Reported on 05/16/2022), Disp: , Rfl:    prochlorperazine (COMPAZINE) 10 MG tablet, Take 1 tablet (10 mg total) by mouth every 6 (six) hours as needed (Nausea or vomiting). (Patient not taking: Reported on 05/16/2022), Disp: 30 tablet, Rfl: 1   tiZANidine (ZANAFLEX) 4 MG tablet, Take 4 mg by mouth 2 (two) times daily as needed. (Patient not taking: Reported on 05/16/2022), Disp: , Rfl:    Vitamin D, Ergocalciferol, (DRISDOL) 1.25 MG (50000 UNIT) CAPS capsule, Take 50,000 Units by mouth every 7 (seven) days. (Patient not taking: Reported on 05/16/2022), Disp: , Rfl:    Water For Irrigation, Sterile (FREE WATER) SOLN, Place 100 mLs into feeding tube every 4 (four) hours. (Patient not taking: Reported on 05/16/2022), Disp: , Rfl:  No current facility-administered medications for this visit.  Facility-Administered Medications Ordered in Other Visits:    heparin lock flush 100 unit/mL, 500 Units, Intracatheter, Once, Iruku, Praveena, MD   sodium chloride flush (NS) 0.9 % injection 10 mL, 10 mL, Intracatheter, Once, [name], MD

## 2022-07-05 DIAGNOSIS — R110 Nausea: Secondary | ICD-10-CM | POA: Diagnosis not present

## 2022-07-05 DIAGNOSIS — Z515 Encounter for palliative care: Secondary | ICD-10-CM | POA: Diagnosis not present

## 2022-07-05 DIAGNOSIS — C329 Malignant neoplasm of larynx, unspecified: Secondary | ICD-10-CM | POA: Diagnosis not present

## 2022-07-05 DIAGNOSIS — C760 Malignant neoplasm of head, face and neck: Secondary | ICD-10-CM | POA: Diagnosis not present

## 2022-07-05 DIAGNOSIS — E43 Unspecified severe protein-calorie malnutrition: Secondary | ICD-10-CM | POA: Diagnosis not present

## 2022-07-05 DIAGNOSIS — G893 Neoplasm related pain (acute) (chronic): Secondary | ICD-10-CM | POA: Diagnosis not present

## 2022-07-06 DIAGNOSIS — J9601 Acute respiratory failure with hypoxia: Secondary | ICD-10-CM | POA: Diagnosis not present

## 2022-07-06 DIAGNOSIS — I499 Cardiac arrhythmia, unspecified: Secondary | ICD-10-CM | POA: Diagnosis not present

## 2022-07-06 DIAGNOSIS — R1110 Vomiting, unspecified: Secondary | ICD-10-CM | POA: Diagnosis not present

## 2022-07-06 DIAGNOSIS — T40601A Poisoning by unspecified narcotics, accidental (unintentional), initial encounter: Secondary | ICD-10-CM | POA: Diagnosis not present

## 2022-07-06 DIAGNOSIS — A419 Sepsis, unspecified organism: Secondary | ICD-10-CM | POA: Diagnosis not present

## 2022-07-06 DIAGNOSIS — J690 Pneumonitis due to inhalation of food and vomit: Secondary | ICD-10-CM | POA: Diagnosis not present

## 2022-07-06 DIAGNOSIS — Z9989 Dependence on other enabling machines and devices: Secondary | ICD-10-CM | POA: Diagnosis not present

## 2022-07-06 DIAGNOSIS — K922 Gastrointestinal hemorrhage, unspecified: Secondary | ICD-10-CM | POA: Diagnosis not present

## 2022-07-06 DIAGNOSIS — I959 Hypotension, unspecified: Secondary | ICD-10-CM | POA: Diagnosis not present

## 2022-07-07 DIAGNOSIS — C760 Malignant neoplasm of head, face and neck: Secondary | ICD-10-CM | POA: Diagnosis not present

## 2022-07-07 DIAGNOSIS — E43 Unspecified severe protein-calorie malnutrition: Secondary | ICD-10-CM | POA: Diagnosis not present

## 2022-07-07 DIAGNOSIS — C329 Malignant neoplasm of larynx, unspecified: Secondary | ICD-10-CM | POA: Diagnosis not present

## 2022-07-07 DIAGNOSIS — G893 Neoplasm related pain (acute) (chronic): Secondary | ICD-10-CM | POA: Diagnosis not present

## 2022-07-07 DIAGNOSIS — R110 Nausea: Secondary | ICD-10-CM | POA: Diagnosis not present

## 2022-07-07 DIAGNOSIS — Z515 Encounter for palliative care: Secondary | ICD-10-CM | POA: Diagnosis not present

## 2022-07-07 DIAGNOSIS — R491 Aphonia: Secondary | ICD-10-CM | POA: Diagnosis not present

## 2022-07-08 DIAGNOSIS — Z515 Encounter for palliative care: Secondary | ICD-10-CM | POA: Diagnosis not present

## 2022-07-08 DIAGNOSIS — R279 Unspecified lack of coordination: Secondary | ICD-10-CM | POA: Diagnosis not present

## 2022-07-08 DIAGNOSIS — R1084 Generalized abdominal pain: Secondary | ICD-10-CM | POA: Diagnosis not present

## 2022-07-08 DIAGNOSIS — G893 Neoplasm related pain (acute) (chronic): Secondary | ICD-10-CM | POA: Diagnosis not present

## 2022-07-08 DIAGNOSIS — R5381 Other malaise: Secondary | ICD-10-CM | POA: Diagnosis not present

## 2022-07-08 DIAGNOSIS — F99 Mental disorder, not otherwise specified: Secondary | ICD-10-CM | POA: Diagnosis not present

## 2022-07-08 DIAGNOSIS — M6281 Muscle weakness (generalized): Secondary | ICD-10-CM | POA: Diagnosis not present

## 2022-07-08 DIAGNOSIS — R491 Aphonia: Secondary | ICD-10-CM | POA: Diagnosis not present

## 2022-07-15 ENCOUNTER — Ambulatory Visit: Payer: Medicare HMO | Admitting: Nurse Practitioner

## 2022-07-15 NOTE — Progress Notes (Deleted)
07/15/2022 ROGUE RAFALSKI [id number] 1950/10/03   Chief Complaint:  History of Present Illness: [name] is a 71 year old male with a past medical history of laryngeal cancer treated with chemo and radiation s/p tracheostomy, hypertension, chronic  anemia, alcohol use disorder, chronic hepatitis C, perforated duodenal ulcer (H. Pylori positive) 03/2021 s/p surgical repair with placement of a graham patch.   He was admitted to the hospital 06/18/2022 with aspiration pneumonia treated with Unasyn IV then oral Augmentin. Hg 8.6 at time of discharge 0/03/2022.  On Pantoprazole '40mg'$  QD.     Latest Ref Rng & Units 06/21/2022    4:17 AM 06/20/2022    4:07 AM 06/19/2022    5:14 AM  CBC  WBC 4.0 - 10.5 K/uL 11.1  12.4  5.4   Hemoglobin 13.0 - 17.0 g/dL 8.6  9.2  8.9   Hematocrit 39.0 - 52.0 % 26.5  28.2  26.6   Platelets 150 - 400 K/uL 276  259  217        Latest Ref Rng & Units 06/21/2022    4:17 AM 06/20/2022    4:07 AM 06/19/2022    5:14 AM  CMP  Glucose 70 - 99 mg/dL 141  109  116   BUN 8 - 23 mg/dL '17  9  13   '$ Creatinine 0.61 - 1.24 mg/dL 0.48  0.53  0.35   Sodium 135 - 145 mmol/L 137  137  134   Potassium 3.5 - 5.1 mmol/L 3.2  3.5  3.6   Chloride 98 - 111 mmol/L 100  98  97   CO2 22 - 32 mmol/L '30  29  28   '$ Calcium 8.9 - 10.3 mg/dL 9.0  9.2  9.2   Total Protein 6.5 - 8.1 g/dL 6.7  7.1  7.0   Total Bilirubin 0.3 - 1.2 mg/dL 0.4  0.4  0.4   Alkaline Phos 38 - 126 U/L 45  44  41   AST 15 - 41 U/L '13  12  17   '$ ALT 0 - 44 U/L '12  10  12     '$ Past Medical History:  Diagnosis Date   Cancer ([place])    Throat cancer 2019   ETOH abuse    Frequent urination    Glaucoma    Hepatitis C virus infection without hepatic coma    dx'ed in 11/2018   History of radiation therapy 05/12/19- 07/06/19   Larynx   Hypertension    Wears denture    upper only; lost lower denture   Past Surgical History:  Procedure Laterality Date   ANKLE SURGERY  2011   right ankle   COLONOSCOPY   02/2019   polyps - Dr Havery Moros   DIRECT LARYNGOSCOPY N/A 04/01/2019   Procedure: DIRECT LARYNGOSCOPY WITH BIOPSY;  Surgeon: Jodi Marble, MD;  Location: [facility];  Service: ENT;  Laterality: N/A;   DIRECT LARYNGOSCOPY N/A 01/06/2020   Procedure: DIRECT LARYNGOSCOPY;  Surgeon: Izora Gala, MD;  Location: [facility] OR;  Service: ENT;  Laterality: N/A;   DIRECT LARYNGOSCOPY N/A 08/13/2020   Procedure: DIRECT LARYNGOSCOPY;  Surgeon: Izora Gala, MD;  Location: [place];  Service: ENT;  Laterality: N/A;   ESOPHAGOSCOPY N/A 04/01/2019   Procedure: ESOPHAGOSCOPY;  Surgeon: Jodi Marble, MD;  Location: [place];  Service: ENT;  Laterality: N/A;   EXCISION ORAL TUMOR Right 08/13/2020   Procedure: BIOPSY OF OROPHARYNGEAL MASS;  Surgeon: Izora Gala, MD;  Location: [facility];  Service: ENT;  Laterality: Right;   EYE SURGERY Right    IR CM INJ ANY COLONIC TUBE W/FLUORO  12/20/2020   IR CM INJ ANY COLONIC TUBE W/FLUORO  03/20/2021   IR GASTROSTOMY TUBE MOD SED  09/24/2020   IR IMAGING GUIDED PORT INSERTION  09/24/2020   IR RADIOLOGIST EVAL & MGMT  07/02/2021   IR REPLC GASTRO/COLONIC TUBE PERCUT W/FLUORO  01/10/2022   KNEE SURGERY     LAPAROTOMY N/A 04/12/2021   Procedure: EXPLORATORY LAPAROTOMY, REPAIR OF DUODENAL ULCER WITH Silvestre Gunner;  Surgeon: Stechschulte, Nickola Major, MD;  Location: [facility];  Service: General;  Laterality: N/A;   LARYNGOSCOPY AND BRONCHOSCOPY N/A 04/01/2019   Procedure: BRONCHOSCOPY;  Surgeon: Jodi Marble, MD;  Location: [place];  Service: ENT;  Laterality: N/A;   RADICAL NECK DISSECTION N/A 01/06/2020   Procedure: RADICAL NECK DISSECTION;  Surgeon: Izora Gala, MD;  Location: [place];  Service: ENT;  Laterality: N/A;       Current Medications, Allergies, Past Medical History, Past Surgical History, Family History and Social History were reviewed in [place] record.   Review of Systems:   Constitutional: Negative for fever, sweats,  chills or weight loss.  Respiratory: Negative for shortness of breath.   Cardiovascular: Negative for chest pain, palpitations and leg swelling.  Gastrointestinal: See HPI.  Musculoskeletal: Negative for back pain or muscle aches.  Neurological: Negative for dizziness, headaches or paresthesias.    Physical Exam: There were no vitals taken for this visit. General: Well developed, w   ***male in no acute distress. Head: Normocephalic and atraumatic. Eyes: No scleral icterus. Conjunctiva pink . Ears: Normal auditory acuity. Mouth: Dentition intact. No ulcers or lesions.  Lungs: Clear throughout to auscultation. Heart: Regular rate and rhythm, no murmur. Abdomen: Soft, nontender and nondistended. No masses or hepatomegaly. Normal bowel sounds x 4 quadrants.  Rectal: *** Musculoskeletal: Symmetrical with no gross deformities. Extremities: No edema. Neurological: Alert oriented x 4. No focal deficits.  Psychological: Alert and cooperative. Normal mood and affect  Assessment and Recommendations: ***

## 2022-08-15 DEATH — deceased
